# Patient Record
Sex: Male | Born: 1950 | Race: White | Hispanic: No | Marital: Married | Smoking: Former smoker
Health system: Southern US, Community
[De-identification: ages and names within clinical notes are randomized; demographics above are authoritative.]

## PROBLEM LIST (undated history)

## (undated) DIAGNOSIS — I639 Cerebral infarction, unspecified: Secondary | ICD-10-CM

## (undated) DIAGNOSIS — E119 Type 2 diabetes mellitus without complications: Secondary | ICD-10-CM

## (undated) DIAGNOSIS — E785 Hyperlipidemia, unspecified: Secondary | ICD-10-CM

## (undated) DIAGNOSIS — K219 Gastro-esophageal reflux disease without esophagitis: Secondary | ICD-10-CM

## (undated) DIAGNOSIS — M869 Osteomyelitis, unspecified: Secondary | ICD-10-CM

## (undated) DIAGNOSIS — Z9289 Personal history of other medical treatment: Secondary | ICD-10-CM

## (undated) DIAGNOSIS — Z9989 Dependence on other enabling machines and devices: Secondary | ICD-10-CM

## (undated) DIAGNOSIS — G8929 Other chronic pain: Secondary | ICD-10-CM

## (undated) DIAGNOSIS — J449 Chronic obstructive pulmonary disease, unspecified: Secondary | ICD-10-CM

## (undated) DIAGNOSIS — G4733 Obstructive sleep apnea (adult) (pediatric): Secondary | ICD-10-CM

## (undated) DIAGNOSIS — M549 Dorsalgia, unspecified: Secondary | ICD-10-CM

## (undated) DIAGNOSIS — F419 Anxiety disorder, unspecified: Secondary | ICD-10-CM

## (undated) DIAGNOSIS — J45909 Unspecified asthma, uncomplicated: Secondary | ICD-10-CM

## (undated) DIAGNOSIS — I10 Essential (primary) hypertension: Secondary | ICD-10-CM

## (undated) DIAGNOSIS — I219 Acute myocardial infarction, unspecified: Secondary | ICD-10-CM

## (undated) DIAGNOSIS — I509 Heart failure, unspecified: Secondary | ICD-10-CM

## (undated) DIAGNOSIS — J189 Pneumonia, unspecified organism: Secondary | ICD-10-CM

## (undated) DIAGNOSIS — I2510 Atherosclerotic heart disease of native coronary artery without angina pectoris: Secondary | ICD-10-CM

## (undated) HISTORY — DX: Hyperlipidemia, unspecified: E78.5

## (undated) HISTORY — DX: Chronic obstructive pulmonary disease, unspecified: J44.9

## (undated) HISTORY — PX: CORONARY ANGIOPLASTY WITH STENT PLACEMENT: SHX49

## (undated) HISTORY — DX: Atherosclerotic heart disease of native coronary artery without angina pectoris: I25.10

## (undated) HISTORY — PX: CARDIAC CATHETERIZATION: SHX172

## (undated) HISTORY — DX: Essential (primary) hypertension: I10

---
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
## 2004-03-31 ENCOUNTER — Inpatient Hospital Stay (HOSPITAL_COMMUNITY): Admission: EM | Admit: 2004-03-31 | Discharge: 2004-04-03 | Payer: Self-pay | Admitting: *Deleted

## 2005-07-08 ENCOUNTER — Encounter: Payer: Self-pay | Admitting: Emergency Medicine

## 2005-07-08 ENCOUNTER — Inpatient Hospital Stay (HOSPITAL_COMMUNITY): Admission: AD | Admit: 2005-07-08 | Discharge: 2005-07-10 | Payer: Self-pay | Admitting: Cardiology

## 2005-07-08 ENCOUNTER — Ambulatory Visit: Payer: Self-pay | Admitting: Cardiology

## 2005-08-03 ENCOUNTER — Ambulatory Visit: Payer: Self-pay | Admitting: Cardiology

## 2005-11-10 ENCOUNTER — Ambulatory Visit: Payer: Self-pay | Admitting: Cardiology

## 2005-11-12 ENCOUNTER — Ambulatory Visit: Payer: Self-pay

## 2005-11-12 ENCOUNTER — Ambulatory Visit: Payer: Self-pay | Admitting: Cardiology

## 2005-11-13 ENCOUNTER — Inpatient Hospital Stay (HOSPITAL_COMMUNITY): Admission: EM | Admit: 2005-11-13 | Discharge: 2005-11-15 | Payer: Self-pay | Admitting: Emergency Medicine

## 2005-11-15 ENCOUNTER — Ambulatory Visit: Payer: Self-pay | Admitting: Cardiology

## 2005-11-16 ENCOUNTER — Ambulatory Visit: Payer: Self-pay | Admitting: *Deleted

## 2005-11-30 ENCOUNTER — Ambulatory Visit: Payer: Self-pay

## 2005-12-22 ENCOUNTER — Ambulatory Visit: Payer: Self-pay | Admitting: Cardiology

## 2006-07-22 ENCOUNTER — Ambulatory Visit: Payer: Self-pay | Admitting: Internal Medicine

## 2006-07-28 ENCOUNTER — Ambulatory Visit: Payer: Self-pay | Admitting: Cardiovascular Disease

## 2006-07-28 ENCOUNTER — Inpatient Hospital Stay (HOSPITAL_BASED_OUTPATIENT_CLINIC_OR_DEPARTMENT_OTHER): Admission: RE | Admit: 2006-07-28 | Discharge: 2006-07-28 | Payer: Self-pay | Admitting: Cardiology

## 2006-08-08 ENCOUNTER — Ambulatory Visit: Payer: Self-pay | Admitting: Internal Medicine

## 2006-08-11 ENCOUNTER — Ambulatory Visit: Payer: Self-pay | Admitting: Cardiology

## 2006-08-30 ENCOUNTER — Ambulatory Visit: Payer: Self-pay | Admitting: Pulmonary Disease

## 2006-10-18 ENCOUNTER — Ambulatory Visit: Payer: Self-pay | Admitting: Cardiology

## 2007-02-12 ENCOUNTER — Ambulatory Visit: Payer: Self-pay | Admitting: Internal Medicine

## 2007-02-12 ENCOUNTER — Inpatient Hospital Stay (HOSPITAL_COMMUNITY): Admission: EM | Admit: 2007-02-12 | Discharge: 2007-02-16 | Payer: Self-pay | Admitting: Emergency Medicine

## 2007-02-17 ENCOUNTER — Ambulatory Visit: Payer: Self-pay | Admitting: Internal Medicine

## 2007-02-28 ENCOUNTER — Ambulatory Visit: Payer: Self-pay | Admitting: Cardiology

## 2007-06-14 ENCOUNTER — Ambulatory Visit: Payer: Self-pay | Admitting: Cardiology

## 2007-10-03 ENCOUNTER — Ambulatory Visit: Payer: Self-pay | Admitting: Cardiology

## 2007-12-04 ENCOUNTER — Inpatient Hospital Stay (HOSPITAL_COMMUNITY): Admission: EM | Admit: 2007-12-04 | Discharge: 2007-12-06 | Payer: Self-pay | Admitting: Emergency Medicine

## 2007-12-04 ENCOUNTER — Ambulatory Visit: Payer: Self-pay | Admitting: Cardiology

## 2008-01-31 ENCOUNTER — Ambulatory Visit: Payer: Self-pay | Admitting: Cardiology

## 2008-06-19 ENCOUNTER — Inpatient Hospital Stay (HOSPITAL_COMMUNITY): Admission: EM | Admit: 2008-06-19 | Discharge: 2008-06-20 | Payer: Self-pay | Admitting: Emergency Medicine

## 2008-06-19 ENCOUNTER — Ambulatory Visit: Payer: Self-pay | Admitting: Cardiology

## 2008-07-30 ENCOUNTER — Ambulatory Visit: Payer: Self-pay | Admitting: Cardiology

## 2008-11-05 ENCOUNTER — Inpatient Hospital Stay (HOSPITAL_COMMUNITY): Admission: EM | Admit: 2008-11-05 | Discharge: 2008-11-09 | Payer: Self-pay | Admitting: Emergency Medicine

## 2008-11-05 ENCOUNTER — Ambulatory Visit: Payer: Self-pay | Admitting: Pulmonary Disease

## 2008-11-05 ENCOUNTER — Ambulatory Visit: Payer: Self-pay | Admitting: Cardiovascular Disease

## 2009-02-06 ENCOUNTER — Ambulatory Visit: Payer: Self-pay | Admitting: Cardiology

## 2009-02-13 ENCOUNTER — Encounter: Payer: Self-pay | Admitting: Cardiology

## 2009-03-12 ENCOUNTER — Inpatient Hospital Stay (HOSPITAL_COMMUNITY): Admission: EM | Admit: 2009-03-12 | Discharge: 2009-03-14 | Payer: Self-pay | Admitting: Emergency Medicine

## 2009-05-26 ENCOUNTER — Telehealth: Payer: Self-pay | Admitting: Cardiology

## 2009-08-07 ENCOUNTER — Ambulatory Visit: Payer: Self-pay | Admitting: Cardiology

## 2009-08-07 DIAGNOSIS — I10 Essential (primary) hypertension: Secondary | ICD-10-CM

## 2009-08-07 DIAGNOSIS — J449 Chronic obstructive pulmonary disease, unspecified: Secondary | ICD-10-CM

## 2009-08-07 DIAGNOSIS — J4489 Other specified chronic obstructive pulmonary disease: Secondary | ICD-10-CM | POA: Insufficient documentation

## 2009-08-07 DIAGNOSIS — E785 Hyperlipidemia, unspecified: Secondary | ICD-10-CM

## 2009-08-07 DIAGNOSIS — R209 Unspecified disturbances of skin sensation: Secondary | ICD-10-CM | POA: Insufficient documentation

## 2009-08-07 DIAGNOSIS — I2510 Atherosclerotic heart disease of native coronary artery without angina pectoris: Secondary | ICD-10-CM

## 2009-08-08 ENCOUNTER — Encounter: Payer: Self-pay | Admitting: Cardiology

## 2009-08-24 ENCOUNTER — Emergency Department (HOSPITAL_COMMUNITY): Admission: EM | Admit: 2009-08-24 | Discharge: 2009-08-24 | Payer: Self-pay | Admitting: Emergency Medicine

## 2009-10-29 ENCOUNTER — Telehealth (INDEPENDENT_AMBULATORY_CARE_PROVIDER_SITE_OTHER): Payer: Self-pay | Admitting: *Deleted

## 2009-10-29 ENCOUNTER — Encounter (INDEPENDENT_AMBULATORY_CARE_PROVIDER_SITE_OTHER): Payer: Self-pay | Admitting: *Deleted

## 2009-11-05 ENCOUNTER — Encounter (INDEPENDENT_AMBULATORY_CARE_PROVIDER_SITE_OTHER): Payer: Self-pay | Admitting: *Deleted

## 2009-11-24 ENCOUNTER — Telehealth: Payer: Self-pay | Admitting: Cardiology

## 2009-11-28 ENCOUNTER — Telehealth (INDEPENDENT_AMBULATORY_CARE_PROVIDER_SITE_OTHER): Payer: Self-pay | Admitting: *Deleted

## 2010-01-21 ENCOUNTER — Encounter: Payer: Self-pay | Admitting: Cardiology

## 2010-01-21 ENCOUNTER — Ambulatory Visit: Payer: Self-pay | Admitting: Cardiology

## 2010-01-30 ENCOUNTER — Ambulatory Visit: Payer: Self-pay | Admitting: Cardiovascular Disease

## 2010-01-30 ENCOUNTER — Ambulatory Visit: Payer: Self-pay | Admitting: Internal Medicine

## 2010-01-30 ENCOUNTER — Inpatient Hospital Stay (HOSPITAL_COMMUNITY): Admission: EM | Admit: 2010-01-30 | Discharge: 2010-02-04 | Payer: Self-pay | Admitting: Emergency Medicine

## 2010-02-04 ENCOUNTER — Encounter: Payer: Self-pay | Admitting: Internal Medicine

## 2010-02-11 ENCOUNTER — Encounter: Payer: Self-pay | Admitting: Internal Medicine

## 2010-02-11 ENCOUNTER — Ambulatory Visit: Payer: Self-pay | Admitting: Internal Medicine

## 2010-02-11 DIAGNOSIS — F17200 Nicotine dependence, unspecified, uncomplicated: Secondary | ICD-10-CM

## 2010-02-12 ENCOUNTER — Encounter (INDEPENDENT_AMBULATORY_CARE_PROVIDER_SITE_OTHER): Payer: Self-pay | Admitting: *Deleted

## 2010-02-17 ENCOUNTER — Telehealth: Payer: Self-pay | Admitting: Internal Medicine

## 2010-02-24 ENCOUNTER — Ambulatory Visit: Payer: Self-pay | Admitting: Internal Medicine

## 2010-03-04 ENCOUNTER — Telehealth: Payer: Self-pay | Admitting: Cardiology

## 2010-03-08 ENCOUNTER — Encounter: Payer: Self-pay | Admitting: Cardiology

## 2010-03-17 ENCOUNTER — Encounter (INDEPENDENT_AMBULATORY_CARE_PROVIDER_SITE_OTHER): Payer: Self-pay | Admitting: *Deleted

## 2010-03-25 ENCOUNTER — Telehealth (INDEPENDENT_AMBULATORY_CARE_PROVIDER_SITE_OTHER): Payer: Self-pay | Admitting: *Deleted

## 2010-03-26 ENCOUNTER — Encounter: Payer: Self-pay | Admitting: Internal Medicine

## 2010-06-15 ENCOUNTER — Telehealth: Payer: Self-pay | Admitting: Cardiology

## 2010-07-24 ENCOUNTER — Ambulatory Visit: Payer: Self-pay | Admitting: Internal Medicine

## 2010-07-25 ENCOUNTER — Inpatient Hospital Stay (HOSPITAL_COMMUNITY): Admission: EM | Admit: 2010-07-25 | Discharge: 2010-07-28 | Payer: Self-pay | Admitting: Emergency Medicine

## 2010-08-08 ENCOUNTER — Emergency Department (HOSPITAL_COMMUNITY): Admission: EM | Admit: 2010-08-08 | Discharge: 2010-08-08 | Payer: Self-pay | Admitting: Emergency Medicine

## 2010-08-09 ENCOUNTER — Inpatient Hospital Stay (HOSPITAL_COMMUNITY): Admission: EM | Admit: 2010-08-09 | Discharge: 2010-08-15 | Payer: Self-pay | Admitting: Emergency Medicine

## 2010-08-15 ENCOUNTER — Encounter: Payer: Self-pay | Admitting: Cardiology

## 2010-10-06 ENCOUNTER — Telehealth: Payer: Self-pay | Admitting: Cardiology

## 2010-10-15 ENCOUNTER — Telehealth: Payer: Self-pay | Admitting: Internal Medicine

## 2010-11-24 ENCOUNTER — Ambulatory Visit: Payer: Self-pay | Admitting: Family Medicine

## 2010-11-24 LAB — CONVERTED CEMR LAB: Microalb, Ur: 0.5 mg/dL (ref 0.00–1.89)

## 2011-01-11 ENCOUNTER — Telehealth: Payer: Self-pay | Admitting: Cardiology

## 2011-01-19 NOTE — Progress Notes (Signed)
Summary: need plavix order from [facility]  Phone Note Call from Patient Call back at [facility] Phone [phone]   Refills Requested: Medication #1:  PLAVIX 75 MG TABS daily Caller: Patient Reason for Call: Talk to Nurse Summary of Call: Pt need plavix 75 mg from [facility].  Initial call taken by: Lorne Skeens,  June 15, 2010 9:09 AM  Follow-up for Phone Call        spoke with pt. His Plavix assistance program has expired.  He will need to fill out paper work and resubmit.  Pt aware.  Will put out front and place samples with form. Dennis Bast, RN, BSN  June 15, 2010 10:28 AM

## 2011-01-19 NOTE — Progress Notes (Signed)
Summary: reorder plavix  Phone Note Call from Patient   Caller: Patient [phone] Reason for Call: Talk to Nurse Summary of Call: pt needing a reorder of plavix Initial call taken by: Glynda Jaeger,  October 06, 2010 9:18 AM  Follow-up for Phone Call        PLAVIX RE ORDERED FOR PT. Follow-up by: Charolotte Capuchin, RN,  October 06, 2010 1:00 PM     Appended Document: reorder plavix Plavix is in and pt is aware.  Samples will be placed out front  Lot [id number] and August  2014

## 2011-01-19 NOTE — Progress Notes (Signed)
Summary: refill/ hla  Phone Note Refill Request Message from:  Patient on February 17, 2010 3:11 PM  [facility] would like for pt to have another round of prednisone and doxycycline, she says R lung continues w/ rhonchi and has fevers from time to time but does not have the exact temp since it was not checked  Initial call taken by: Marin Roberts RN,  February 17, 2010 3:14 PM  Follow-up for Phone Call        Rx denied because he needs to be seen in the clinic for re-evaluation. please make appointment soon. Follow-up by: Jackson Latino MD,  February 19, 2010 1:37 PM

## 2011-01-19 NOTE — Assessment & Plan Note (Signed)
Summary: see note from HHN, fevers at night, sob/pcp-yang/hla   Vital Signs:  Patient profile:   60 year old male Height:      71 inches (180.34 cm) Weight:      189.03 pounds (85.92 kg) BMI:     26.46 O2 Sat:      97 % on Room air Temp:     97.3 degrees F (36.28 degrees C) oral Pulse rate:   70 / minute BP sitting:   126 / 83  (right arm)  Vitals Entered By: Angelina Ok RN (February 24, 2010 10:45 AM)  O2 Flow:  Room air Is Patient Diabetic? No Pain Assessment Patient in pain? no      Nutritional Status BMI of 25 - 29 = overweight  Have you ever been in a relationship where you felt threatened, hurt or afraid?No   Does patient need assistance? Functional Status Self care Ambulation Normal Comments REcheck up. Fevers at night occassionally.  Chest tightness took a breathing treatment  and ASA felt better.   Primary Care Provider:  Jackson Latino MD   History of Present Illness: 60 year old man with pmh as described in EMR is here today for a follow up appointment for his COPD. He says that he had an episode of SOB last week when he bacame so short of breath that he had to get up and stand and take his breathing treatments. Also had a couple of days when he was having chills and fever. No other complaints at this time.  Depression History:      The patient denies a depressed mood most of the day and a diminished interest in his usual daily activities.         Preventive Screening-Counseling & Management  Alcohol-Tobacco     Smoking Status: quit     Smoking Cessation Counseling: yes     Smoke Cessation Stage: quit     Packs/Day: <0.25     Year Quit: 2011  Comments: Stopped after last hospital visit.  Problems Prior to Update: 1)  Tobacco User  (ICD-305.1) 2)  Paresthesia  (ICD-782.0) 3)  Hyperlipidemia  (ICD-272.4) 4)  Hypertension  (ICD-401.9) 5)  Cad  (ICD-414.00) 6)  COPD  (ICD-496)  Medications Prior to Update: 1)  Tracer Study .... Daily 2)   Nitroglycerin 0.4 Mg Subl (Nitroglycerin) .... Place 1 Tablet Under Tongue As Directed 3)  Plavix 75 Mg Tabs (Clopidogrel Bisulfate) .... Daily 4)  Albuterol Sulfate (2.5 Mg/42ml) 0.083% Nebu (Albuterol Sulfate) .... Use One Vial in [employer] Three Times A Day 5)  Ipratropium Bromide 0.02 % Soln (Ipratropium Bromide) .... Inhale 1 Vial Via Nebulizer Three Times A Day 6)  Pravastatin Sodium 40 Mg Tabs (Pravastatin Sodium) .... One By Mouth At Bedtime 7)  Aspirin 325 Mg  Tabs (Aspirin) .... Daily 8)  Metoprolol Tartrate 50 Mg Tabs (Metoprolol Tartrate) .... Take 1 Tablet By Mouth Two Times A Day 9)  Colace 100 Mg Caps (Docusate Sodium) .... Take 1 Tablet By Mouth Two Times A Day  Current Medications (verified): 1)  Tracer Study .... Daily 2)  Nitroglycerin 0.4 Mg Subl (Nitroglycerin) .... Place 1 Tablet Under Tongue As Directed 3)  Plavix 75 Mg Tabs (Clopidogrel Bisulfate) .... Daily 4)  Albuterol Sulfate (2.5 Mg/58ml) 0.083% Nebu (Albuterol Sulfate) .... Use One Vial in [employer] Three Times A Day 5)  Ipratropium Bromide 0.02 % Soln (Ipratropium Bromide) .... Inhale 1 Vial Via Nebulizer Three Times A Day 6)  Pravastatin Sodium  40 Mg Tabs (Pravastatin Sodium) .... One By Mouth At Bedtime 7)  Aspirin 325 Mg  Tabs (Aspirin) .... Daily 8)  Metoprolol Tartrate 50 Mg Tabs (Metoprolol Tartrate) .... Take 1 Tablet By Mouth Two Times A Day 9)  Colace 100 Mg Caps (Docusate Sodium) .... Take 1 Tablet By Mouth Two Times A Day 10)  Furosemide 20 Mg Tabs (Furosemide) .... Take 1 Tablet By Mouth Once A Day 11)  Flovent Hfa 110 Mcg/act Aero (Fluticasone Propionate  Hfa) .... Take 2 Puffs Two Times A Day  Allergies (verified): 1)  ! * Liptor  Past History:  Past Medical History: Last updated: 08/07/2009  1. Severe COPD.   2. Coronary artery disease status post multiple stents, most recent       catheterization in July 2009.   3. Hypertension.   4. Hyperlipidemia.      Past Surgical History: Last  updated: 08/07/2009 none  Family History: Last updated: 08/07/2009  He has a very heavy tobacco history, used to smoke as   much as 3 packs per day, but now says he only smokes 1-1/2 packs in the   past year.  Denies alcohol or illicit drug use.  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  Lives with   his wife in [place].   Social History: Last updated: 08/07/2009 Significant for hypertension and coronary artery   disease.      Risk Factors: Smoking Status: quit (02/24/2010) Packs/Day: <0.25 (02/24/2010)  Social History: Smoking Status:  quit Packs/Day:  <0.25  Review of Systems      See HPI  Physical Exam  Additional Exam:  Gen: AOx3, in no acute distress Eyes: PERRL, EOMI ENT:MMM, No erythema noted in posterior pharynx Neck: No JVD, No LAP Chest: b/l wheezing and increased expiratory sounds CVS: regular rhythmic rate, NO M/R/G, S1 S2 normal Abdo: soft,ND, BS+x4, Non tender and No hepatosplenomegaly EXT: No odema noted Neuro: Non focal, gait is normal Skin: no rashes noted.    Impression & Recommendations:  Problem # 1:  COPD (ICD-496) Assessment Deteriorated Patient is having shortness of breath on the current doses of nebulizers. His physical exam is positive for mild wheezing but his saturation is 97% on room air. Dr Aundria Rud feels that we should add a inhaled steroid to his regimen. We also discussed that he is probably emphysema as his sats are normal on room air but he is dyspnoea(pink puffers).  We also discussed that since he is having symptoms of PND, low dose of lasix might benefit him. His updated medication list for this problem includes:    Albuterol Sulfate (2.5 Mg/24ml) 0.083% Nebu (Albuterol sulfate) ..... Use one vial in nedbulizer three times a day    Ipratropium Bromide 0.02 % Soln (Ipratropium bromide) ..... Inhale 1 vial via nebulizer three times a day    Flovent Hfa 110 Mcg/act Aero (Fluticasone  propionate  hfa) .Marland Kitchen... Take 2 puffs two times a day  Pulmonary Functions Reviewed: O2 sat: 97 (02/24/2010)     Problem # 2:  TOBACCO USER (ICD-305.1) Assessment: Improved He quit smoking since he has been discharged from the hospital. We congratulated him for that and gave him all the information about how to obatin help if he wants. Encouraged smoking cessation and discussed different methods for smoking cessation.   Problem # 3:  HYPERTENSION (ICD-401.9) Assessment: Improved Well controlled on current meds. His updated medication list for this problem includes:  Metoprolol Tartrate 50 Mg Tabs (Metoprolol tartrate) .Marland Kitchen... Take 1 tablet by mouth two times a day    Furosemide 20 Mg Tabs (Furosemide) .Marland Kitchen... Take 1 tablet by mouth once a day  BP today: 126/83 Prior BP: 105/74 (02/11/2010)  Problem # 4:  Preventive Health Care (ICD-V70.0) Assessment: Comment Only  Reviewed preventive care protocols, scheduled due services, and updated immunizations.  Complete Medication List: 1)  Tracer Study  .... Daily 2)  Nitroglycerin 0.4 Mg Subl (Nitroglycerin) .... Place 1 tablet under tongue as directed 3)  Plavix 75 Mg Tabs (Clopidogrel bisulfate) .... Daily 4)  Albuterol Sulfate (2.5 Mg/10ml) 0.083% Nebu (Albuterol sulfate) .... Use one vial in nedbulizer three times a day 5)  Ipratropium Bromide 0.02 % Soln (Ipratropium bromide) .... Inhale 1 vial via nebulizer three times a day 6)  Pravastatin Sodium 40 Mg Tabs (Pravastatin sodium) .... One by mouth at bedtime 7)  Aspirin 325 Mg Tabs (Aspirin) .... Daily 8)  Metoprolol Tartrate 50 Mg Tabs (Metoprolol tartrate) .... Take 1 tablet by mouth two times a day 9)  Colace 100 Mg Caps (Docusate sodium) .... Take 1 tablet by mouth two times a day 10)  Furosemide 20 Mg Tabs (Furosemide) .... Take 1 tablet by mouth once a day 11)  Flovent Hfa 110 Mcg/act Aero (Fluticasone propionate  hfa) .... Take 2 puffs two times a day  Patient Instructions: 1)   Please schedule a follow-up appointment in 2 weeks. 2)  It is important that you exercise regularly at least 20 minutes 5 times a week. If you develop chest pain, have severe difficulty breathing, or feel very tired , stop exercising immediately and seek medical attention. 3)  Check your Blood Pressure regularly. If it is above: 140/90 you should make an appointment. Prescriptions: FLOVENT HFA 110 MCG/ACT AERO (FLUTICASONE PROPIONATE  HFA) take 2 puffs two times a day  #1 x 1   Entered and Authorized by:   Lars Mage MD   Signed by:   Lars Mage MD on 02/24/2010   Method used:   Electronically to        [facility] [address] (retail)       [phone]       [place], [place]  [address]       Ph: [phone]       Fax: [phone]   RxID:   [id number] FUROSEMIDE 20 MG TABS (FUROSEMIDE) Take 1 tablet by mouth once a day  #30 x 1   Entered and Authorized by:   Lars Mage MD   Signed by:   Lars Mage MD on 02/24/2010   Method used:   Electronically to        [facility] [address] (retail)       [phone]       [place], [place]  [address]       Ph: [phone]       Fax: [phone]   RxID:   [id number]   Prevention & Chronic Care Immunizations   Influenza vaccine: Not documented    Tetanus booster: Not documented    Pneumococcal vaccine: Not documented    H. zoster vaccine: Not documented  Colorectal Screening   Hemoccult: Not documented   Hemoccult action/deferral: Deferred  (02/24/2010)    Colonoscopy: Not documented   Colonoscopy action/deferral: Deferred  (02/24/2010)  Other Screening   PSA: Not documented  Smoking status: quit  (02/24/2010)  Lipids   Total Cholesterol: Not documented   LDL: Not documented   LDL Direct: Not documented   HDL: Not documented   Triglycerides: Not documented    SGOT (AST): Not documented   SGPT (ALT): Not documented   Alkaline phosphatase: Not  documented   Total bilirubin: Not documented    Lipid flowsheet reviewed?: Yes   Progress toward LDL goal: At goal  Hypertension   Last Blood Pressure: 126 / 83  (02/24/2010)   Serum creatinine: Not documented   Serum potassium Not documented    Hypertension flowsheet reviewed?: Yes   Progress toward BP goal: Improved  Self-Management Support :    Patient will work on the following items until the next clinic visit to reach self-care goals:     Medications and monitoring: take my medicines every day, bring all of my medications to every visit  (02/24/2010)     Eating: drink diet soda or water instead of juice or soda, eat more vegetables, eat foods that are low in salt  (02/24/2010)     Activity: take a 30 minute walk every day  (02/24/2010)    Hypertension self-management support: Education handout  (02/24/2010)   Hypertension education handout printed    Lipid self-management support: Education handout, Resources for patients handout  (02/11/2010)      Vital Signs:  Patient profile:   60 year old male Height:      71 inches (180.34 cm) Weight:      189.03 pounds (85.92 kg) BMI:     26.46 O2 Sat:      97 % Temp:     97.3 degrees F (36.28 degrees C) oral Pulse rate:   70 / minute BP sitting:   126 / 83  (right arm)  Vitals Entered By: Angelina Ok RN (February 24, 2010 10:45 AM)  O2 Flow:  Room air

## 2011-01-19 NOTE — Progress Notes (Signed)
Summary: REFILL PLAVIX/ called for from [employer]/pt call again  Phone Note Refill Request Message from:  Patient on March 04, 2010 10:14 AM  Refills Requested: Medication #1:  PLAVIX 75 MG TABS daily PT NEED PLAVIX ORDERED  SAID  OFFICE ORDERS THIS MEDICATION (BRISTOL MYERS SQUIBB)  Initial call taken by: Judie Grieve,  March 04, 2010 10:15 AM  Follow-up for Phone Call        pt calling again, request call back, Migdalia Dk  March 11, 2010 10:17 AM   pt call to see if meds are in yet. please call [phone] Omer Jack  March 18, 2010 10:09 AM  NOT HERE YET  PAM FLEMING-HAYES,RN Plavix is in.  Pt aware. Follow-up by: Charolotte Capuchin, RN,  March 20, 2010 8:43 AM

## 2011-01-19 NOTE — Medication Information (Signed)
Summary: [facility]: RX  [facility]: RX   Imported By: Florinda Marker 02/12/2010 14:53:25  _____________________________________________________________________  External Attachment:    Type:   Image     Comment:   External Document

## 2011-01-19 NOTE — Letter (Signed)
Summary: Appointment - Missed  [facility]     [place], [place]    Phone:   Fax:      February 12, 2010 MRN: [id number]   [name] [address] [place], [place]  [address]   Dear [name],  Our records indicate you missed your appointment on  02-09-2010    with  Dr. Antoine Poche It is very important that we reach you to reschedule this appointment. We look forward to participating in your health care needs. Please contact us at the number listed above at your earliest convenience to reschedule this appointment.     Sincerely,      Lorne Skeens  [facility] Scheduling Team

## 2011-01-19 NOTE — Assessment & Plan Note (Signed)
Summary: NEW TO CLINIC/SB.   Vital Signs:  Patient profile:   60 year old male Height:      71 inches Weight:      189.1 pounds BMI:     26.47 O2 Sat:      95 % on Room air Temp:     97.0 degrees F oral Pulse rate:   80 / minute BP sitting:   105 / 74  (right arm)  Vitals Entered By: Filomena Jungling NT II (February 11, 2010 1:50 PM)  O2 Flow:  Room air CC: HFU Is Patient Diabetic? No Pain Assessment Patient in pain? yes     Location: hip, headaches Intensity: 4 Type: aching Onset of pain  hippain chronic -    headaches recently Nutritional Status BMI of 25 - 29 = overweight  Have you ever been in a relationship where you felt threatened, hurt or afraid?No   Does patient need assistance? Functional Status Self care Ambulation Normal   Primary Care Provider:  Jackson Latino MD  CC:  HFU.  History of Present Illness: Patient is a 60 year old male with PMH of severe COPD, HTN and CAD status post multiple stents, who came here for recent hospital discharge. He was discharged to home in 02/04/2010 for COPD exacerbation and after discharge he has been doing well, his SOB has significantlyu improved, no other c/o including CP, fever or chill. He has been taking all his meds, but don't have nebulizer and has to borrow from other people. Current smoker and 2-3 cigarettes a day, denies drug abuse.  Preventive Screening-Counseling & Management  Alcohol-Tobacco     Smoking Status: current     Smoking Cessation Counseling: yes     Packs/Day: 1-2 week  Problems Prior to Update: 1)  Paresthesia  (ICD-782.0) 2)  Hyperlipidemia  (ICD-272.4) 3)  Hypertension  (ICD-401.9) 4)  Cad  (ICD-414.00) 5)  COPD  (ICD-496)  Medications Prior to Update: 1)  Tracer Study .... Daily 2)  Nitroglycerin 0.4 Mg Subl (Nitroglycerin) .... Place 1 Tablet Under Tongue As Directed 3)  Plavix 75 Mg Tabs (Clopidogrel Bisulfate) .... Daily 4)  Albuterol Sulfate (2.5 Mg/5ml) 0.083% Nebu (Albuterol Sulfate)  .... Use One Vial in [facility] Three Times A Day 5)  Ipratropium Bromide 0.02 % Soln (Ipratropium Bromide) .... Inhale 1 Vial Via Nebulizer Three Times A Day 6)  Pravastatin Sodium 40 Mg Tabs (Pravastatin Sodium) .... One By Mouth At Bedtime 7)  Aspirin 325 Mg  Tabs (Aspirin) .... Daily 8)  Metoprolol Tartrate 50 Mg Tabs (Metoprolol Tartrate) .... Take 1 Tablet By Mouth Two Times A Day 9)  Colace 100 Mg Caps (Docusate Sodium) .... Take 1 Tablet By Mouth Two Times A Day  Current Medications (verified): 1)  Tracer Study .... Daily 2)  Nitroglycerin 0.4 Mg Subl (Nitroglycerin) .... Place 1 Tablet Under Tongue As Directed 3)  Plavix 75 Mg Tabs (Clopidogrel Bisulfate) .... Daily 4)  Albuterol Sulfate (2.5 Mg/27ml) 0.083% Nebu (Albuterol Sulfate) .... Use One Vial in [facility] Three Times A Day 5)  Ipratropium Bromide 0.02 % Soln (Ipratropium Bromide) .... Inhale 1 Vial Via Nebulizer Three Times A Day 6)  Pravastatin Sodium 40 Mg Tabs (Pravastatin Sodium) .... One By Mouth At Bedtime 7)  Aspirin 325 Mg  Tabs (Aspirin) .... Daily 8)  Metoprolol Tartrate 50 Mg Tabs (Metoprolol Tartrate) .... Take 1 Tablet By Mouth Two Times A Day 9)  Colace 100 Mg Caps (Docusate Sodium) .... Take 1 Tablet By Mouth Two Times A  Day  Allergies (verified): 1)  ! * Liptor  Past History:  Past Medical History: Last updated: 08/07/2009  1. Severe COPD.   2. Coronary artery disease status post multiple stents, most recent       catheterization in July 2009.   3. Hypertension.   4. Hyperlipidemia.      Family History: Last updated: 08/07/2009  He has a very heavy tobacco history, used to smoke as   much as 3 packs per day, but now says he only smokes 1-1/2 packs in the   past year.  Denies alcohol or illicit drug use.  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  Lives with   his wife in [place].   Social History: Last updated: 08/07/2009 Significant for  hypertension and coronary artery   disease.      Risk Factors: Smoking Status: current (02/11/2010) Packs/Day: 1-2 week (02/11/2010)  Family History: Reviewed history from 08/07/2009 and no changes required.  He has a very heavy tobacco history, used to smoke as   much as 3 packs per day, but now says he only smokes 1-1/2 packs in the   past year.  Denies alcohol or illicit drug use.  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  Lives with   his wife in [place].   Social History: Reviewed history from 08/07/2009 and no changes required. Significant for hypertension and coronary artery   disease.     Smoking Status:  current Packs/Day:  1-2 week  Review of Systems  The patient denies anorexia, fever, decreased hearing, chest pain, dyspnea on exertion, peripheral edema, prolonged cough, headaches, hemoptysis, abdominal pain, melena, and hematochezia.    Physical Exam  General:  alert, well-developed, well-nourished, and well-hydrated.   Head:  normocephalic.   Eyes:  pupils reactive to light.   Ears:  no external deformities.   Nose:  no nasal discharge.   Mouth:  pharynx pink and moist.   Neck:  supple.   Lungs:  normal respiratory effort, no intercostal retractions, no accessory muscle use, normal breath sounds, and no crackles.  Mild wheezing. Heart:  normal rate, regular rhythm, no murmur, no gallop, and no JVD.   Abdomen:  soft, non-tender, normal bowel sounds, no distention, and no masses.   Msk:  normal ROM, no joint tenderness, no joint swelling, no joint warmth, and no redness over joints.   Pulses:  2+ Extremities:  No edema. Neurologic:  alert & oriented X3, cranial nerves II-XII intact, strength normal in all extremities, sensation intact to light touch, and DTRs symmetrical and normal.     Impression & Recommendations:  Problem # 1:  COPD (ICD-496) Assessment Improved His SOB has significantly improved and will  continue current medications and advised him to quit smoking. Wheezing has been much better. Has written prescription for nebulizer and will fax it to [facility]. May add steroid inhaler if frequent flare of COPD. His updated medication list for this problem includes:    Albuterol Sulfate (2.5 Mg/1ml) 0.083% Nebu (Albuterol sulfate) ..... Use one vial in nedbulizer three times a day    Ipratropium Bromide 0.02 % Soln (Ipratropium bromide) ..... Inhale 1 vial via nebulizer three times a day  Pulmonary Functions Reviewed: O2 sat: 95 (02/11/2010)     Problem # 2:  HYPERTENSION (ICD-401.9) Assessment: Improved His BP at goal and will continue BB as he also has  CAD.  His updated medication list for this problem includes:    Metoprolol Tartrate 50 Mg Tabs (Metoprolol tartrate) .Marland Kitchen... Take 1 tablet by mouth two times a day  BP today: 105/74 Prior BP: 115/74 (08/07/2009)  Problem # 3:  CAD (ICD-414.00) Assessment: Unchanged Now no CP and myoview negative.  He has CAD with multiple stents and will continue current meds and advised him to quit smoking and exercise.  His updated medication list for this problem includes:    Nitroglycerin 0.4 Mg Subl (Nitroglycerin) .Marland Kitchen... Place 1 tablet under tongue as directed    Plavix 75 Mg Tabs (Clopidogrel bisulfate) .Marland Kitchen... Daily    Aspirin 325 Mg Tabs (Aspirin) .Marland Kitchen... Daily    Metoprolol Tartrate 50 Mg Tabs (Metoprolol tartrate) .Marland Kitchen... Take 1 tablet by mouth two times a day  Problem # 4:  TOBACCO USER (ICD-305.1) Assessment: Comment Only  Encouraged smoking cessation and discussed different methods for smoking cessation.   Complete Medication List: 1)  Tracer Study  .... Daily 2)  Nitroglycerin 0.4 Mg Subl (Nitroglycerin) .... Place 1 tablet under tongue as directed 3)  Plavix 75 Mg Tabs (Clopidogrel bisulfate) .... Daily 4)  Albuterol Sulfate (2.5 Mg/78ml) 0.083% Nebu (Albuterol sulfate) .... Use one vial in nedbulizer three times a day 5)   Ipratropium Bromide 0.02 % Soln (Ipratropium bromide) .... Inhale 1 vial via nebulizer three times a day 6)  Pravastatin Sodium 40 Mg Tabs (Pravastatin sodium) .... One by mouth at bedtime 7)  Aspirin 325 Mg Tabs (Aspirin) .... Daily 8)  Metoprolol Tartrate 50 Mg Tabs (Metoprolol tartrate) .... Take 1 tablet by mouth two times a day 9)  Colace 100 Mg Caps (Docusate sodium) .... Take 1 tablet by mouth two times a day  Patient Instructions: 1)  Please schedule a follow-up appointment in 4 months. 2)  Tobacco is very bad for your health and your loved ones! You Should stop smoking!. 3)  Stop Smoking Tips: Choose a Quit date. Cut down before the Quit date. decide what you will do as a substitute when you feel the urge to smoke(gum,toothpick,exercise). 4)  It is important that you exercise regularly at least 20 minutes 5 times a week. If you develop chest pain, have severe difficulty breathing, or feel very tired , stop exercising immediately and seek medical attention.    Prevention & Chronic Care Immunizations   Influenza vaccine: Not documented    Tetanus booster: Not documented    Pneumococcal vaccine: Not documented    H. zoster vaccine: Not documented  Colorectal Screening   Hemoccult: Not documented    Colonoscopy: Not documented  Other Screening   PSA: Not documented   Smoking status: current  (02/11/2010)   Smoking cessation counseling: yes  (02/11/2010)  Lipids   Total Cholesterol: Not documented   LDL: Not documented   LDL Direct: Not documented   HDL: Not documented   Triglycerides: Not documented    SGOT (AST): Not documented   SGPT (ALT): Not documented   Alkaline phosphatase: Not documented   Total bilirubin: Not documented    Lipid flowsheet reviewed?: Yes   Progress toward LDL goal: At goal  Hypertension   Last Blood Pressure: 105 / 74  (02/11/2010)   Serum creatinine: Not documented   Serum potassium Not documented    Hypertension flowsheet  reviewed?: Yes   Progress toward BP goal: At goal  Self-Management Support :    Patient will work on the following items until the next clinic visit to reach  self-care goals:     Medications and monitoring: take my medicines every day  (02/11/2010)     Eating: eat more vegetables, use fresh or frozen vegetables, eat foods that are low in salt, eat baked foods instead of fried foods, eat fruit for snacks and desserts  (02/11/2010)    Hypertension self-management support: Education handout, Resources for patients handout  (02/11/2010)   Hypertension education handout printed    Lipid self-management support: Education handout, Resources for patients handout  (02/11/2010)     Lipid education handout printed      Resource handout printed.

## 2011-01-19 NOTE — Miscellaneous (Signed)
[facility]: [facility]   Imported By: Margie Billet 04/17/2010 14:53:58  _____________________________________________________________________  External Attachment:    Type:   Image     Comment:   External Document

## 2011-01-19 NOTE — Progress Notes (Signed)
Summary: med refill/gp  Phone Note Refill Request Message from:  Fax from Pharmacy on October 15, 2010 9:59 AM  Refills Requested: Medication #1:  ALBUTEROL SULFATE (2.5 MG/3ML) 0.083% NEBU use one vial in nedbulizer three times a day   Dosage confirmed as above?Dosage Confirmed   Last Refilled: 11/10/2008  Medication #2:  IPRATROPIUM BROMIDE 0.02 % SOLN inhale 1 vial via nebulizer three times a day   Dosage confirmed as above?Dosage Confirmed   Last Refilled: 11/10/2008 Last appt. 02/24/10.   Method Requested: Electronic Initial call taken by: Chinita Pester RN,  October 15, 2010 10:00 AM    Prescriptions: ALBUTEROL SULFATE (2.5 MG/3ML) 0.083% NEBU (ALBUTEROL SULFATE) use one vial in nedbulizer three times a day  #150 x 6   Entered and Authorized by:   Jackson Latino MD   Signed by:   Jackson Latino MD on 10/15/2010   Method used:   Electronically to        [facility] [phone]* (retail)       [address]       [place], [place]  [address]       Ph: [phone]       Fax: [phone]   RxID:   [id number] IPRATROPIUM BROMIDE 0.02 % SOLN (IPRATROPIUM BROMIDE) inhale 1 vial via nebulizer three times a day  #150 x 6   Entered and Authorized by:   Jackson Latino MD   Signed by:   Jackson Latino MD on 10/15/2010   Method used:   Electronically to        [facility] [phone]* (retail)       [address]       [place], [place]  [address]       Ph: [phone]       Fax: [phone]   RxID:   [id number]

## 2011-01-19 NOTE — Miscellaneous (Signed)
Summary: Home Health Certification/Care Plan  Home Health Certification/Care Plan   Imported By: Roderic Ovens 03/23/2010 12:02:04  _____________________________________________________________________  External Attachment:    Type:   Image     Comment:   External Document

## 2011-01-19 NOTE — Letter (Signed)
Summary: [facility]   Imported By: Roderic Ovens 09/17/2010 11:21:29  _____________________________________________________________________  External Attachment:    Type:   Image     Comment:   External Document

## 2011-01-19 NOTE — Assessment & Plan Note (Signed)
Summary: Hospital d/c: 02/04/2010 - COPD exacerbation    ---- 01/03/2009 2:03 PM, Chauncey Reading DO wrote: Hospital Discharge  Date of admission: 01/30/2010  Date of discharge: 02/04/2010  Brief reason for admission/active problems: COPD exacerbation, resolved on d/c. Discharged on doxycycline for 5 more days and prednisone taper which he ws supposed to take only 1 tablet 10 mg.   Followup needed: Pt has follow upappointment on 02/11/2010 at 2 pm with Dr. Aldine Contes. On follow up appointment please assess following:  1) reasses if pt's COPD has been well controlled on current regimen and see if patient has completed his abx 2) follow up on dobutamine myoview which was done on discharge  Prescriptions: PRAVASTATIN SODIUM 40 MG TABS (PRAVASTATIN SODIUM) one by mouth at bedtime  #30 Each x 10   Entered and Authorized by:   Mliss Sax MD   Signed by:   Mliss Sax MD on 02/04/2010   Method used:   Electronically to        [facility] [phone]* (retail)       [address]       [place], [place]  [address]       Ph: [phone]       Fax: [phone]   RxID:   [id number] IPRATROPIUM BROMIDE 0.02 % SOLN (IPRATROPIUM BROMIDE) inhale 1 vial via nebulizer three times a day  #150 x 6   Entered and Authorized by:   Mliss Sax MD   Signed by:   Mliss Sax MD on 02/04/2010   Method used:   Electronically to        [facility] [phone]* (retail)       [address]       [place], [place]  [address]       Ph: [phone]       Fax: [phone]   RxID:   [id number] ALBUTEROL SULFATE (2.5 MG/3ML) 0.083% NEBU (ALBUTEROL SULFATE) use one vial in nedbulizer three times a day  #150 x 6   Entered and Authorized by:   Mliss Sax MD   Signed by:   Mliss Sax MD on 02/04/2010   Method used:   Electronically to        [facility] [phone]* (retail)       [address]       [place], [place]  [address]       Ph: [phone]       Fax: [phone]   RxID:    [id number] PLAVIX 75 MG TABS (CLOPIDOGREL BISULFATE) daily  #30 x 6   Entered and Authorized by:   Mliss Sax MD   Signed by:   Mliss Sax MD on 02/04/2010   Method used:   Electronically to        [facility] [phone]* (retail)       [address]       [place], [place]  [address]       Ph: [phone]       Fax: [phone]   RxID:   [id number] DOXYCYCLINE HYCLATE 100 MG TABS (DOXYCYCLINE HYCLATE) Take 1 tablet by mouth two times a day  #10 x 0   Entered and Authorized by:   Mliss Sax MD   Signed by:   Mliss Sax MD on 02/04/2010   Method used:   Electronically to        [facility] [phone]* (retail)       [address]  [place], [place]  [address]       Ph: [phone]       Fax: [phone]   RxID:   [id number] COLACE 100 MG CAPS (DOCUSATE SODIUM) Take 1 tablet by mouth two times a day  #60 x 3   Entered and Authorized by:   Mliss Sax MD   Signed by:   Mliss Sax MD on 02/04/2010   Method used:   Electronically to        [facility] [phone]* (retail)       [address]       [place], [place]  [address]       Ph: [phone]       Fax: [phone]   RxID:   [id number] METOPROLOL TARTRATE 50 MG TABS (METOPROLOL TARTRATE) Take 1 tablet by mouth two times a day  #60 x 3   Entered and Authorized by:   Mliss Sax MD   Signed by:   Mliss Sax MD on 02/04/2010   Method used:   Electronically to        [facility] [phone]* (retail)       [address]       [place], [place]  [address]       Ph: [phone]       Fax: [phone]   RxID:   [id number]     The medication and problem lists have been updated.  Please see the dictated discharge summary for details.   Patient Instructions: 1)  You have follow upappointment on Feb 23rd with Dr. Aldine Contes at 2 pm. 2)  Check your Blood Pressure regularly. If it is above 170: you should make an appointment.

## 2011-01-19 NOTE — Consult Note (Signed)
[facility]   Imported By: Roderic Ovens 02/17/2010 14:31:34  _____________________________________________________________________  External Attachment:    Type:   Image     Comment:   External Document

## 2011-01-19 NOTE — Miscellaneous (Signed)
  Clinical Lists Changes  Observations: Added new observation of RS STUDY: TRACER - study completion2/2/11 (03/17/2010 11:04)      Research Study Name: TRACER - study completion2/2/11

## 2011-01-19 NOTE — Miscellaneous (Signed)
Summary: HIPAA Restrictions  HIPAA Restrictions   Imported By: Florinda Marker 02/11/2010 15:33:07  _____________________________________________________________________  External Attachment:    Type:   Image     Comment:   External Document

## 2011-01-19 NOTE — Progress Notes (Signed)
  FAxed All Cardiac over to [facility] to fax [phone] [facility]  March 25, 2010 12:44 PM

## 2011-01-21 NOTE — Progress Notes (Addendum)
Summary: refill on med  Phone Note Refill Request Call back at [phone] Message from:  Patient on January 11, 2011 11:59 AM  Refills Requested: Medication #1:  PLAVIX 75 MG TABS daily bristol meyers.    Method Requested: Fax to [facility] Initial call taken by: Lorne Skeens,  January 11, 2011 11:59 AM  Follow-up for Phone Call        called will send RX.Pt notified. Marrion Coy, CNA  January 15, 2011 9:11 AM  Follow-up by: Marrion Coy, CNA,  January 15, 2011 9:11 AM     Appended Document: refill on med Plavix sample here and pt aware they are available at the front desk

## 2011-01-23 ENCOUNTER — Inpatient Hospital Stay (HOSPITAL_COMMUNITY)
Admission: EM | Admit: 2011-01-23 | Discharge: 2011-02-02 | DRG: 208 | Disposition: A | Discharge status: Home Health | Payer: Medicare Other | Attending: Pulmonary Disease | Admitting: Pulmonary Disease

## 2011-01-23 ENCOUNTER — Emergency Department (HOSPITAL_COMMUNITY): Payer: Medicare Other

## 2011-01-23 DIAGNOSIS — N179 Acute kidney failure, unspecified: Secondary | ICD-10-CM | POA: Diagnosis not present

## 2011-01-23 DIAGNOSIS — I2510 Atherosclerotic heart disease of native coronary artery without angina pectoris: Secondary | ICD-10-CM | POA: Diagnosis present

## 2011-01-23 DIAGNOSIS — J9600 Acute respiratory failure, unspecified whether with hypoxia or hypercapnia: Secondary | ICD-10-CM | POA: Diagnosis not present

## 2011-01-23 DIAGNOSIS — R079 Chest pain, unspecified: Secondary | ICD-10-CM | POA: Diagnosis present

## 2011-01-23 DIAGNOSIS — J441 Chronic obstructive pulmonary disease with (acute) exacerbation: Principal | ICD-10-CM | POA: Diagnosis present

## 2011-01-23 DIAGNOSIS — F17200 Nicotine dependence, unspecified, uncomplicated: Secondary | ICD-10-CM | POA: Diagnosis present

## 2011-01-23 DIAGNOSIS — K219 Gastro-esophageal reflux disease without esophagitis: Secondary | ICD-10-CM | POA: Diagnosis present

## 2011-01-23 DIAGNOSIS — Z9861 Coronary angioplasty status: Secondary | ICD-10-CM

## 2011-01-23 DIAGNOSIS — T380X5A Adverse effect of glucocorticoids and synthetic analogues, initial encounter: Secondary | ICD-10-CM | POA: Diagnosis not present

## 2011-01-23 DIAGNOSIS — R7309 Other abnormal glucose: Secondary | ICD-10-CM | POA: Diagnosis not present

## 2011-01-23 DIAGNOSIS — J129 Viral pneumonia, unspecified: Secondary | ICD-10-CM | POA: Diagnosis present

## 2011-01-24 LAB — BLOOD GAS, VENOUS
O2 Saturation: 89.5 %
pCO2, Ven: 37 mmHg — ABNORMAL LOW (ref 45.0–50.0)
pH, Ven: 7.456 — ABNORMAL HIGH (ref 7.250–7.300)
pO2, Ven: 51.3 mmHg — ABNORMAL HIGH (ref 30.0–45.0)

## 2011-01-24 LAB — CK TOTAL AND CKMB (NOT AT ARMC)
CK, MB: 3.3 ng/mL (ref 0.3–4.0)
Relative Index: INVALID (ref 0.0–2.5)

## 2011-01-24 LAB — POCT I-STAT, CHEM 8
BUN: 13 mg/dL (ref 6–23)
Calcium, Ion: 1.04 mmol/L — ABNORMAL LOW (ref 1.12–1.32)
Chloride: 105 mEq/L (ref 96–112)
Creatinine, Ser: 1.1 mg/dL (ref 0.4–1.5)
HCT: 47 % (ref 39.0–52.0)
Hemoglobin: 16 g/dL (ref 13.0–17.0)
Potassium: 3.8 mEq/L (ref 3.5–5.1)
Sodium: 138 mEq/L (ref 135–145)
TCO2: 27 mmol/L (ref 0–100)

## 2011-01-24 LAB — CBC
HCT: 40.9 % (ref 39.0–52.0)
HCT: 42.6 % (ref 39.0–52.0)
Hemoglobin: 14.4 g/dL (ref 13.0–17.0)
Hemoglobin: 14.6 g/dL (ref 13.0–17.0)
MCH: 29.2 pg (ref 26.0–34.0)
MCHC: 34.3 g/dL (ref 30.0–36.0)
MCV: 84.7 fL (ref 78.0–100.0)
Platelets: 160 10*3/uL (ref 150–400)
RBC: 4.83 MIL/uL (ref 4.22–5.81)
WBC: 9.6 10*3/uL (ref 4.0–10.5)

## 2011-01-24 LAB — POCT CARDIAC MARKERS
CKMB, poc: 1.3 ng/mL (ref 1.0–8.0)
Myoglobin, poc: 59 ng/mL (ref 12–200)
Troponin i, poc: <0.05 ng/mL (ref 0.00–0.09)

## 2011-01-24 LAB — DIFFERENTIAL
Basophils Absolute: 0 10*3/uL (ref 0.0–0.1)
Basophils Relative: 0 % (ref 0–1)
Basophils Relative: 0 % (ref 0–1)
Eosinophils Absolute: 0 10*3/uL (ref 0.0–0.7)
Eosinophils Relative: 0 % (ref 0–5)
Eosinophils Relative: 0 % (ref 0–5)
Lymphocytes Relative: 8 % — ABNORMAL LOW (ref 12–46)
Lymphs Abs: 0.8 10*3/uL (ref 0.7–4.0)
Monocytes Absolute: 0.2 10*3/uL (ref 0.1–1.0)
Monocytes Relative: 2 % — ABNORMAL LOW (ref 3–12)
Neutro Abs: 7.7 10*3/uL (ref 1.7–7.7)
Neutro Abs: 8 10*3/uL — ABNORMAL HIGH (ref 1.7–7.7)
Neutrophils Relative %: 83 % — ABNORMAL HIGH (ref 43–77)

## 2011-01-24 LAB — COMPREHENSIVE METABOLIC PANEL
CO2: 26 mEq/L (ref 19–32)
Calcium: 9.5 mg/dL (ref 8.4–10.5)
Creatinine, Ser: 1.05 mg/dL (ref 0.4–1.5)
GFR calc Af Amer: >60 mL/min (ref >60)
GFR calc non Af Amer: >60 mL/min (ref >60)
Glucose, Bld: 135 mg/dL — ABNORMAL HIGH (ref 70–99)

## 2011-01-24 LAB — BRAIN NATRIURETIC PEPTIDE: Pro B Natriuretic peptide (BNP): <30 pg/mL (ref 0.0–100.0)

## 2011-01-24 LAB — CARDIAC PANEL(CRET KIN+CKTOT+MB+TROPI)
CK, MB: 3.6 ng/mL (ref 0.3–4.0)
Total CK: 79 U/L (ref 7–232)

## 2011-01-24 LAB — TROPONIN I: Troponin I: <0.01 ng/mL (ref 0.00–0.06)

## 2011-01-25 ENCOUNTER — Inpatient Hospital Stay (HOSPITAL_COMMUNITY): Payer: Medicare Other

## 2011-01-25 LAB — COMPREHENSIVE METABOLIC PANEL
ALT: 12 U/L (ref 0–53)
AST: 16 U/L (ref 0–37)
Alkaline Phosphatase: 45 U/L (ref 39–117)
Glucose, Bld: 164 mg/dL — ABNORMAL HIGH (ref 70–99)
Potassium: 4.2 mEq/L (ref 3.5–5.1)
Sodium: 143 mEq/L (ref 135–145)
Total Protein: 5.7 g/dL — ABNORMAL LOW (ref 6.0–8.3)

## 2011-01-25 LAB — BLOOD GAS, ARTERIAL
Acid-Base Excess: 3.2 mmol/L — ABNORMAL HIGH (ref 0.0–2.0)
Bicarbonate: 29.4 mEq/L — ABNORMAL HIGH (ref 20.0–24.0)
Delivery systems: POSITIVE
Drawn by: 129711
Drawn by: 340271
Expiratory PAP: 5
Inspiratory PAP: 13
Mode: POSITIVE
O2 Content: 5 L/min
O2 Saturation: 98.1 %
O2 Saturation: 99.5 %
Patient temperature: 98.6
TCO2: 26 mmol/L (ref 0–100)
pH, Arterial: 7.369 (ref 7.350–7.450)
pO2, Arterial: 104 mmHg — ABNORMAL HIGH (ref 80.0–100.0)

## 2011-01-25 LAB — DIFFERENTIAL
Basophils Absolute: 0 10*3/uL (ref 0.0–0.1)
Eosinophils Relative: 0 % (ref 0–5)
Lymphocytes Relative: 5 % — ABNORMAL LOW (ref 12–46)
Neutro Abs: 6.1 10*3/uL (ref 1.7–7.7)

## 2011-01-25 LAB — CBC
HCT: 38.1 % — ABNORMAL LOW (ref 39.0–52.0)
Hemoglobin: 12.9 g/dL — ABNORMAL LOW (ref 13.0–17.0)
RDW: 14.5 % (ref 11.5–15.5)
WBC: 6.8 10*3/uL (ref 4.0–10.5)

## 2011-01-26 ENCOUNTER — Inpatient Hospital Stay (HOSPITAL_COMMUNITY): Payer: Medicare Other

## 2011-01-26 DIAGNOSIS — R0602 Shortness of breath: Secondary | ICD-10-CM

## 2011-01-26 DIAGNOSIS — J441 Chronic obstructive pulmonary disease with (acute) exacerbation: Secondary | ICD-10-CM

## 2011-01-26 DIAGNOSIS — J9600 Acute respiratory failure, unspecified whether with hypoxia or hypercapnia: Secondary | ICD-10-CM

## 2011-01-26 LAB — BLOOD GAS, ARTERIAL
Acid-Base Excess: 4.1 mmol/L — ABNORMAL HIGH (ref 0.0–2.0)
Bicarbonate: 30.5 mEq/L — ABNORMAL HIGH (ref 20.0–24.0)
Bicarbonate: 32.4 mEq/L — ABNORMAL HIGH (ref 20.0–24.0)
FIO2: 0.7 %
Mode: POSITIVE
O2 Saturation: 99.9 %
PEEP: 5 cmH2O
TCO2: 27.1 mmol/L (ref 0–100)
pCO2 arterial: 55.3 mmHg — ABNORMAL HIGH (ref 35.0–45.0)
pCO2 arterial: 65.4 mmHg (ref 35.0–45.0)
pH, Arterial: 7.361 (ref 7.350–7.450)
pO2, Arterial: 363 mmHg — ABNORMAL HIGH (ref 80.0–100.0)

## 2011-01-26 LAB — DIFFERENTIAL
Basophils Absolute: 0 10*3/uL (ref 0.0–0.1)
Basophils Relative: 0 % (ref 0–1)
Eosinophils Relative: 0 % (ref 0–5)
Monocytes Absolute: 0.8 10*3/uL (ref 0.1–1.0)
Monocytes Relative: 9 % (ref 3–12)

## 2011-01-26 LAB — PROTIME-INR
INR: 0.89 (ref 0.00–1.49)
Prothrombin Time: 12.3 seconds (ref 11.6–15.2)

## 2011-01-26 LAB — CBC
HCT: 41.6 % (ref 39.0–52.0)
MCH: 29.8 pg (ref 26.0–34.0)
MCHC: 34.1 g/dL (ref 30.0–36.0)
RDW: 14.7 % (ref 11.5–15.5)

## 2011-01-26 LAB — BASIC METABOLIC PANEL
CO2: 30 mEq/L (ref 19–32)
Calcium: 8.9 mg/dL (ref 8.4–10.5)
Creatinine, Ser: 0.77 mg/dL (ref 0.4–1.5)
GFR calc Af Amer: >60 mL/min (ref >60)
GFR calc non Af Amer: >60 mL/min (ref >60)
Glucose, Bld: 109 mg/dL — ABNORMAL HIGH (ref 70–99)
Sodium: 142 mEq/L (ref 135–145)

## 2011-01-26 LAB — APTT: aPTT: 32 seconds (ref 24–37)

## 2011-01-26 LAB — TROPONIN I: Troponin I: 0.01 ng/mL (ref 0.00–0.06)

## 2011-01-26 LAB — PHOSPHORUS: Phosphorus: 3.4 mg/dL (ref 2.3–4.6)

## 2011-01-26 LAB — MAGNESIUM: Magnesium: 2.5 mg/dL (ref 1.5–2.5)

## 2011-01-26 LAB — BRAIN NATRIURETIC PEPTIDE: Pro B Natriuretic peptide (BNP): 70.4 pg/mL (ref 0.0–100.0)

## 2011-01-26 MED ORDER — IOHEXOL 300 MG/ML  SOLN: 100.0000 mL | Freq: Once | INTRAMUSCULAR | Status: AC | PRN

## 2011-01-27 ENCOUNTER — Inpatient Hospital Stay (HOSPITAL_COMMUNITY): Payer: Medicare Other

## 2011-01-27 LAB — CARDIOLIPIN ANTIBODIES, IGG, IGM, IGA
Anticardiolipin IgA: 1 APL U/mL — ABNORMAL LOW (ref <22)
Anticardiolipin IgG: 1 GPL U/mL — ABNORMAL LOW (ref <23)
Anticardiolipin IgM: 7 MPL U/mL — ABNORMAL LOW (ref <11)

## 2011-01-27 LAB — BLOOD GAS, ARTERIAL
Acid-Base Excess: 6.9 mmol/L — ABNORMAL HIGH (ref 0.0–2.0)
Acid-Base Excess: 8 mmol/L — ABNORMAL HIGH (ref 0.0–2.0)
Bicarbonate: 33.3 mEq/L — ABNORMAL HIGH (ref 20.0–24.0)
Drawn by: 103701
FIO2: 0.4 %
FIO2: 0.3 %
MECHVT: 600 mL
O2 Saturation: 99.8 %
Patient temperature: 98.6
RATE: 12 resp/min
TCO2: 29.6 mmol/L (ref 0–100)
pCO2 arterial: 54.8 mmHg — ABNORMAL HIGH (ref 35.0–45.0)
pH, Arterial: 7.41 (ref 7.350–7.450)
pO2, Arterial: 211 mmHg — ABNORMAL HIGH (ref 80.0–100.0)
pO2, Arterial: 90.1 mmHg (ref 80.0–100.0)

## 2011-01-27 LAB — DIFFERENTIAL
Basophils Absolute: 0 10*3/uL (ref 0.0–0.1)
Basophils Relative: 0 % (ref 0–1)
Eosinophils Absolute: 0 10*3/uL (ref 0.0–0.7)
Eosinophils Relative: 0 % (ref 0–5)
Lymphocytes Relative: 5 % — ABNORMAL LOW (ref 12–46)
Monocytes Absolute: 0.4 10*3/uL (ref 0.1–1.0)

## 2011-01-27 LAB — CBC
HCT: 39 % (ref 39.0–52.0)
MCHC: 32.8 g/dL (ref 30.0–36.0)
Platelets: 151 10*3/uL (ref 150–400)
RDW: 14.9 % (ref 11.5–15.5)
WBC: 6.9 10*3/uL (ref 4.0–10.5)

## 2011-01-27 LAB — BASIC METABOLIC PANEL
BUN: 17 mg/dL (ref 6–23)
Chloride: 99 mEq/L (ref 96–112)
GFR calc non Af Amer: >60 mL/min (ref >60)
Glucose, Bld: 148 mg/dL — ABNORMAL HIGH (ref 70–99)
Potassium: 4.6 mEq/L (ref 3.5–5.1)
Sodium: 142 mEq/L (ref 135–145)

## 2011-01-28 ENCOUNTER — Inpatient Hospital Stay (HOSPITAL_COMMUNITY): Payer: Medicare Other

## 2011-01-28 LAB — BASIC METABOLIC PANEL
BUN: 22 mg/dL (ref 6–23)
CO2: 33 mEq/L — ABNORMAL HIGH (ref 19–32)
Calcium: 8.6 mg/dL (ref 8.4–10.5)
Creatinine, Ser: 0.79 mg/dL (ref 0.4–1.5)
GFR calc non Af Amer: >60 mL/min (ref >60)
Glucose, Bld: 149 mg/dL — ABNORMAL HIGH (ref 70–99)

## 2011-01-28 LAB — CBC
HCT: 37.9 % — ABNORMAL LOW (ref 39.0–52.0)
Hemoglobin: 12.4 g/dL — ABNORMAL LOW (ref 13.0–17.0)
MCH: 28.5 pg (ref 26.0–34.0)
MCHC: 32.7 g/dL (ref 30.0–36.0)
MCV: 87.1 fL (ref 78.0–100.0)
RDW: 14.2 % (ref 11.5–15.5)

## 2011-01-28 LAB — GLUCOSE, CAPILLARY
Glucose-Capillary: 151 mg/dL — ABNORMAL HIGH (ref 70–99)
Glucose-Capillary: 177 mg/dL — ABNORMAL HIGH (ref 70–99)
Glucose-Capillary: 187 mg/dL — ABNORMAL HIGH (ref 70–99)
Glucose-Capillary: 118 mg/dL — ABNORMAL HIGH (ref 70–99)
Glucose-Capillary: 136 mg/dL — ABNORMAL HIGH (ref 70–99)

## 2011-01-28 LAB — BLOOD GAS, ARTERIAL
Bicarbonate: 32.9 mEq/L — ABNORMAL HIGH (ref 20.0–24.0)
PEEP: 5 cmH2O
Patient temperature: 98.6
pCO2 arterial: 54.3 mmHg — ABNORMAL HIGH (ref 35.0–45.0)
pH, Arterial: 7.399 (ref 7.350–7.450)
pO2, Arterial: 83.7 mmHg (ref 80.0–100.0)

## 2011-01-29 ENCOUNTER — Inpatient Hospital Stay (HOSPITAL_COMMUNITY): Payer: Medicare Other

## 2011-01-29 LAB — GLUCOSE, CAPILLARY
Glucose-Capillary: 178 mg/dL — ABNORMAL HIGH (ref 70–99)
Glucose-Capillary: 135 mg/dL — ABNORMAL HIGH (ref 70–99)

## 2011-01-29 LAB — CBC
HCT: 36.4 % — ABNORMAL LOW (ref 39.0–52.0)
Hemoglobin: 11.9 g/dL — ABNORMAL LOW (ref 13.0–17.0)
MCH: 28.5 pg (ref 26.0–34.0)
MCV: 87.1 fL (ref 78.0–100.0)
Platelets: 133 10*3/uL — ABNORMAL LOW (ref 150–400)
RBC: 4.18 MIL/uL — ABNORMAL LOW (ref 4.22–5.81)
WBC: 5.7 10*3/uL (ref 4.0–10.5)

## 2011-01-29 LAB — CULTURE, BAL-QUANTITATIVE W GRAM STAIN: Colony Count: 7000

## 2011-01-29 LAB — BASIC METABOLIC PANEL
BUN: 25 mg/dL — ABNORMAL HIGH (ref 6–23)
CO2: 33 mEq/L — ABNORMAL HIGH (ref 19–32)
Chloride: 104 mEq/L (ref 96–112)
Potassium: 4 mEq/L (ref 3.5–5.1)

## 2011-01-30 ENCOUNTER — Inpatient Hospital Stay (HOSPITAL_COMMUNITY): Payer: Medicare Other

## 2011-01-30 LAB — GLUCOSE, CAPILLARY
Glucose-Capillary: 150 mg/dL — ABNORMAL HIGH (ref 70–99)
Glucose-Capillary: 242 mg/dL — ABNORMAL HIGH (ref 70–99)
Glucose-Capillary: 176 mg/dL — ABNORMAL HIGH (ref 70–99)
Glucose-Capillary: 191 mg/dL — ABNORMAL HIGH (ref 70–99)

## 2011-01-30 LAB — SODIUM, URINE, RANDOM: Sodium, Ur: 138 mEq/L

## 2011-01-30 LAB — BASIC METABOLIC PANEL
BUN: 19 mg/dL (ref 6–23)
Calcium: 8.3 mg/dL — ABNORMAL LOW (ref 8.4–10.5)
Creatinine, Ser: 0.69 mg/dL (ref 0.4–1.5)
GFR calc non Af Amer: >60 mL/min (ref >60)
Glucose, Bld: 150 mg/dL — ABNORMAL HIGH (ref 70–99)
Potassium: 4 mEq/L (ref 3.5–5.1)

## 2011-01-30 LAB — CREATININE, URINE, RANDOM: Creatinine, Urine: 72.7 mg/dL

## 2011-01-31 LAB — GLUCOSE, CAPILLARY
Glucose-Capillary: 203 mg/dL — ABNORMAL HIGH (ref 70–99)
Glucose-Capillary: 72 mg/dL (ref 70–99)

## 2011-02-01 ENCOUNTER — Inpatient Hospital Stay (HOSPITAL_COMMUNITY): Payer: Medicare Other

## 2011-02-01 LAB — GLUCOSE, CAPILLARY
Glucose-Capillary: 117 mg/dL — ABNORMAL HIGH (ref 70–99)
Glucose-Capillary: 116 mg/dL — ABNORMAL HIGH (ref 70–99)
Glucose-Capillary: 190 mg/dL — ABNORMAL HIGH (ref 70–99)

## 2011-02-01 LAB — BASIC METABOLIC PANEL
BUN: 19 mg/dL (ref 6–23)
GFR calc non Af Amer: >60 mL/min (ref >60)
Glucose, Bld: 149 mg/dL — ABNORMAL HIGH (ref 70–99)
Potassium: 4.1 mEq/L (ref 3.5–5.1)

## 2011-02-01 LAB — CBC
Hemoglobin: 12.5 g/dL — ABNORMAL LOW (ref 13.0–17.0)
MCH: 28.7 pg (ref 26.0–34.0)
MCHC: 33.5 g/dL (ref 30.0–36.0)

## 2011-02-12 NOTE — Discharge Summary (Signed)
NAME:  [name], [name]                ACCOUNT NO.:  [id number]  MEDICAL RECORD NO.:  [id number]           PATIENT TYPE:  I  LOCATION:  1333                         FACILITY:  [facility]  PHYSICIAN:  Kathlen Mody, MD       DATE OF BIRTH:  Dec 07, 1951  DATE OF ADMISSION:  01/23/2011 DATE OF DISCHARGE:  02/02/2011                              DISCHARGE SUMMARY   DISCHARGE DIAGNOSIS:  Chronic obstructive pulmonary disease exacerbation.  OTHER DIAGNOSES: 1. Coronary artery disease, status post percutaneous transluminal     coronary angioplasty. 2. Hypertension. 3. Hyperlipidemia. 4. Tobacco dependence. 5. Gastroesophageal reflux disease.  DISCHARGE MEDICATIONS: 1. Tapering steroids. 2. Tylenol 325 mg to 650 mg every 4 hours as needed. 3. Symbicort inhalation 2 puffs twice daily. 4. Guanfacine 600 mg 1 tablet by mouth twice daily. 5. Spiriva 18 mcg inhalation daily. 6. Ultram 50 mg every 8 hours for about 7 days. 7. Albuterol nebulizer 4 times daily as needed. 8. Aspirin 325 mg 1 tablet daily. 9. Nitroglycerin sublingual 0.4 mg for chest pain as needed. 10.Plavix 75 mg 1 tablet daily. 11.Lovastatin 40 mg 1 tablet daily at bedtime.  PERTINENT LABORATORIES:  CBC on admission was normal.  Sodium was 138, potassium 3.8, chloride 105, bicarb 27, creatinine 1.1, and BUN 13. Point-of-care cardiac markers were negative.  Next set of point-of-care cardiac markers were also negative.  B-natriuretic peptide was less than 30.  MRSA screen negative.  D-dimer was negative.  The day of discharge, the patient's CBC showed a hemoglobin of 12.5, hematocrit of 37.3, and platelets of 143,000.  The BMP with metabolic panel was pretty much normal except for a glucose of 149, magnesium was 2.3.  RADIOLOGY:  Chest x-ray on January 24, 2011, showed no acute cardiopulmonary abnormalities.  CT angiogram on January 27, 2011, negative for embolus, COPD changes, coronary artery atherosclerosis. Repeat chest  x-ray on February 01, 2011, no active disease, evidence of pulmonary hyperinflation and perihilar __________  with the coronary artery stents.  CONSULTS CALLED:  Pulmonary consult was called for COPD.  BRIEF HOSPITAL COURSE:  This is a 60 year old gentleman with a history of COPD, on home oxygen and coronary artery disease admitted for shortness of breath.  The patient was intermittently using oxygen and was smoking at home.  The patient was admitted for COPD exacerbation. He was started on IV Solu-Medrol, Avelox, Spiriva, Symbicort, and albuterol nebulizers q.6 h. p.r.n., was counseled on smoking cessation for about 10 minutes.  The patient was admitted to telemetry.  Cardiac enzymes were cycled.  Cardiac enzymes came back negative.  Pulmonary consult was called for COPD exacerbation.  The patient was also found to have a viral pneumonitis superimposed on COPD exacerbation.  Over the days of hospitalization, his shortness of breath has gradually improved and he was asked to use portable oxygen continuously at 2-3 L to keep oxygen saturation more than 92%.  His IV Solu-Medrol was gradually decreased and was changed to p.o. prednisone at 60 mg on the day of discharge and was given prescriptions of tapered prednisone with a maintenance dose of 10 mg daily.  He  was asked to follow with his pulmonologist in about 1-2 weeks after discharge.  He was counseled on smoking cessation, was asked to keep his saturation more than 92%.  Coronary artery disease.  His aspirin and Plavix were continued.  His cardiac enzymes were negative.  EKG was unchanged.  Gastroesophageal reflux disease.  Protonix was continued.  PHYSICAL EXAMINATION:  VITAL SIGNS:  On the day of discharge, the patient's vitals were temperature of 98.1, pulse of 78 per minute, respirations of 20 per minute, blood pressure of 129/75, and saturating 94% on 3 L of oxygen. GENERAL:  He was alert, afebrile, and oriented x3, no acute  distress. CARDIOVASCULAR EXAM:  S1-S2 heard. RESPIRATORY EXAM:  Good air entry bilateral. ABDOMEN:  Soft, nontender, and nondistended.  Good bowel sounds. EXTREMITIES:  No pedal edema.  The patient was discharged home to follow up with his PCP in about 1-2 weeks and with pulmonologist, Dr. Delton Coombes in about 1-2 weeks.          ______________________________ Kathlen Mody, MD     VA/MEDQ  D:  02/09/2011  T:  02/10/2011  Job:  [id number]  Electronically Signed by Kathlen Mody MD on 02/12/2011 12:17:41 AM

## 2011-02-17 ENCOUNTER — Telehealth (INDEPENDENT_AMBULATORY_CARE_PROVIDER_SITE_OTHER): Payer: Self-pay | Admitting: *Deleted

## 2011-02-18 ENCOUNTER — Encounter: Payer: Self-pay | Admitting: Adult Health

## 2011-02-18 ENCOUNTER — Ambulatory Visit (INDEPENDENT_AMBULATORY_CARE_PROVIDER_SITE_OTHER): Payer: Self-pay | Admitting: Adult Health

## 2011-02-18 DIAGNOSIS — J449 Chronic obstructive pulmonary disease, unspecified: Secondary | ICD-10-CM

## 2011-02-19 ENCOUNTER — Telehealth (INDEPENDENT_AMBULATORY_CARE_PROVIDER_SITE_OTHER): Payer: Self-pay | Admitting: *Deleted

## 2011-02-23 ENCOUNTER — Inpatient Hospital Stay: Payer: Medicare Other | Admitting: Emergency Medicine

## 2011-02-24 ENCOUNTER — Telehealth: Payer: Self-pay | Admitting: Emergency Medicine

## 2011-02-25 NOTE — Progress Notes (Signed)
Summary: Prescription and refill  Phone Note Call from Patient Call back at Home Phone [phone] P PH     Caller: Patient Reason for Call: Refill Medication Summary of Call: Needs new prescription for ibuprofen bromide, also refill for pravastatin, sent to [facility] on [address]. Initial call taken by: Leonette Monarch,  February 19, 2011 3:06 PM  Follow-up for Phone Call        Spoke with pt and advised that rx for pravastatin should be filled by his PCP and the refill on ipratropium was sent to pharm at the time of his ov.  Pt verbalized understanding.  Follow-up by: Vernie Murders,  February 19, 2011 4:22 PM

## 2011-02-25 NOTE — Progress Notes (Signed)
Summary: wheezing  Phone Note From Other Clinic   Caller: cindy- nurse w/ [facility] Call For: byrum Summary of Call: pt has HFU w/ RB on 02/23/11. nurse cindy calling to report wheezing (COPD pt). w/ pain in R side- lung. no fever. call pt at his new # at home as he is "very alert" and knows his meds/ conditon "very well" per cindy. pt # [id number]. cindy's # is [phone] Initial call taken by: Tivis Ringer, CNA,  February 17, 2011 12:04 PM  Follow-up for Phone Call        Spoke with pt.  He states has been wheezing for the past several days and had some "lung pain" with deep inspiration.  Will move up HFU.  Pt sched to see TP tommorrow pm 3/1 at 3:30 pm. Advised ED sooner if needed. Pt verbalized understanding.  Follow-up by: Vernie Murders,  February 17, 2011 2:05 PM

## 2011-03-02 NOTE — Assessment & Plan Note (Signed)
Summary: NP follow up - post hosp   Primary Provider/Referring Provider:  Jackson Latino MD  CC:  post hosp - c/o wheezing, tightness in chest, pain with deep inspiration, and dry cough x3days.  History of Present Illness: 60 yo male with known hx of COPD    February 18, 2011 --Presents for post hospital follow up. Admitted 2/4-2/14/12 for COPD exacerbation. Tx with IV abx, steroids and Nebs. Felt to have a viral pneumonitis. Discharged on steroid taper. He was started on Spiriva and Symbicort at discharge. He does not have insurance and is a [facility] pt. He can not afford this rx. Gets Budesonide via [facility]. Previous got duoneb via pharmacy on $4 list.  Since discharge he is feeling better but still has some dry cough and pain on deep inspiration. Still smoking. Denies chest pain, orthopnea, hemoptysis, fever, n/v/d, edema, headache.   Medications Prior to Update: 1)  Albuterol Sulfate (2.5 Mg/49ml) 0.083% Nebu (Albuterol Sulfate) .... Use One Vial in [facility] Three Times A Day 2)  Tracer Study .... Daily 3)  Ipratropium Bromide 0.02 % Soln (Ipratropium Bromide) .... Inhale 1 Vial Via Nebulizer Three Times A Day 4)  Nitroglycerin 0.4 Mg Subl (Nitroglycerin) .... Place 1 Tablet Under Tongue As Directed 5)  Plavix 75 Mg Tabs (Clopidogrel Bisulfate) .... Daily 6)  Metoprolol Tartrate 50 Mg Tabs (Metoprolol Tartrate) .... Take 1 Tablet By Mouth Two Times A Day 7)  Pravastatin Sodium 40 Mg Tabs (Pravastatin Sodium) .... One By Mouth At Bedtime 8)  Aspirin 325 Mg  Tabs (Aspirin) .... Daily 9)  Flovent Hfa 110 Mcg/act Aero (Fluticasone Propionate  Hfa) .... Take 2 Puffs Two Times A Day 10)  Colace 100 Mg Caps (Docusate Sodium) .... Take 1 Tablet By Mouth Two Times A Day 11)  Furosemide 20 Mg Tabs (Furosemide) .... Take 1 Tablet By Mouth Once A Day  Current Medications (verified): 1)  Tracer Study .... Daily 2)  Nitroglycerin 0.4 Mg Subl (Nitroglycerin) .... Place 1 Tablet Under Tongue As  Directed 3)  Plavix 75 Mg Tabs (Clopidogrel Bisulfate) .... Daily 4)  Albuterol Sulfate (2.5 Mg/79ml) 0.083% Nebu (Albuterol Sulfate) .... Use One Vial in [facility] Three Times A Day 5)  Pravastatin Sodium 40 Mg Tabs (Pravastatin Sodium) .... One By Mouth At Bedtime 6)  Aspirin 325 Mg  Tabs (Aspirin) .... Daily 7)  Colace 100 Mg Caps (Docusate Sodium) .... Take 1 Tablet By Mouth Two Times A Day 8)  Furosemide 20 Mg Tabs (Furosemide) .... Take 1 Tablet By Mouth Once A Day 9)  Tylenol 325 Mg Tabs (Acetaminophen) .... Per Bottle As Needed 10)  Pulmicort 0.25 Mg/45ml Susp (Budesonide) .... Inhale 1 Vial in [facility] Two Times A Day 11)  Mucinex 600 Mg Xr12h-Tab (Guaifenesin) .... Take 1-2 Tablets Every 12 Hours As Needed 12)  Spiriva Handihaler 18 Mcg Caps (Tiotropium Bromide Monohydrate) .... Inhale Contents of 1 Capsule Once A Day 13)  Ventolin Hfa 108 (90 Base) Mcg/act Aers (Albuterol Sulfate) .... Inhale 2 Puffs Every Four Hours As Needed 14)  Oxygen 2l/min .... Wear 2l/min At Rest and Increase To 3l/min W/ Activity  Allergies (verified): 1)  ! * Liptor  Past History:  Past Medical History: Last updated: 08/07/2009  1. Severe COPD.   2. Coronary artery disease status post multiple stents, most recent       catheterization in July 2009.   3. Hypertension.   4. Hyperlipidemia.      Past Surgical History: Last updated: 08/07/2009 none  Family History: Last updated: 02/18/2011 Significant for hypertension and coronary artery   disease.   Social History: Last updated: 02/18/2011  He has a very heavy tobacco history, used to smoke as   much as 3 packs per day, but now says he only smokes 1-1/2 packs in the   past year.  Denies alcohol or illicit drug use.  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  Lives with   his wife in [place].   Risk Factors: Smoking Status: quit (02/24/2010) Packs/Day: <0.25 (02/24/2010)  Family  History: Significant for hypertension and coronary artery   disease.   Social History:  He has a very heavy tobacco history, used to smoke as   much as 3 packs per day, but now says he only smokes 1-1/2 packs in the   past year.  Denies alcohol or illicit drug use.  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  Lives with   his wife in [place].   Review of Systems      See HPI  Vital Signs:  Patient profile:   60 year old male Height:      71 inches Weight:      193.38 pounds BMI:     27.07 O2 Sat:      95 % on Room air Temp:     97.1 degrees F oral Pulse rate:   99 / minute BP sitting:   104 / 76  (left arm) Cuff size:   regular  Vitals Entered By: Boone Master CNA/MA (February 18, 2011 3:35 PM)  O2 Flow:  Room air CC: post hosp - c/o wheezing, tightness in chest, pain with deep inspiration, dry cough x3days Is Patient Diabetic? No Comments Medications reviewed with patient Daytime contact number verified with patient. Boone Master CNA/MA  February 18, 2011 3:35 PM    Physical Exam  Additional Exam:  Gen: AOx3, in no acute distress Eyes: PERRL, EOMI ZOX:WRUEA pos pharynx  Neck: No JVD, No LAP Chest: coarse BS w/ few scattered rhonchi CVS: regular rhythmic rate, NO M/R/G, S1 S2 normal Abdo: soft,ND, BS+x4, Non tender and No hepatosplenomegaly EXT: No odema noted Neuro: Non focal, gait is normal Skin: no rashes noted.    Impression & Recommendations:  Problem # 1:  COPD (ICD-496)  Recent exacerbation in active smoker.  pt does not have insurance will not be able to afford inhalers unless we can get on med assistance program for now getting budesonide on med assistance and duoneb is affordable via local pharm.  will cont this for now.  Plan:  Stop Symbicort and Spiriva  Restart   Ipratropium and Albuterol Neb three times a day  Restart Pulmicort Neb two times a day  NO SMOKING  Oxygen 2l/m at rest and 3l/m with  walking.  follow up Dr. Delton Coombes in 3-4 weeks  Please contact office for sooner follow up if symptoms do not improve or worsen   Orders: Est. Patient Level III (54098)  Medications Added to Medication List This Visit: 1)  Ipratropium Bromide 0.03 % Soln (Ipratropium bromide) .Marland Kitchen.. 1 neb three times a day 2)  Pulmicort 0.25 Mg/54ml Susp (Budesonide) .... Inhale 1 vial in [facility]hn two times a day 3)  Tylenol 325 Mg Tabs (Acetaminophen) .... Per bottle as needed 4)  Mucinex 600 Mg Xr12h-tab (Guaifenesin) .... Take 1-2 tablets every 12 hours as needed 5)  Spiriva Handihaler 18 Mcg Caps (Tiotropium bromide monohydrate) .... Inhale contents of 1 capsule once a day 6)  Ventolin Hfa 108 (90 Base) Mcg/act Aers (Albuterol sulfate) .... Inhale 2 puffs every four hours as needed 7)  Oxygen 2l/min  .... Wear 2l/min at rest and increase to 3l/min w/ activity  Patient Instructions: 1)  Stop Symbicort and Spiriva  2)  Restart   Ipratropium and Albuterol Neb three times a day  3)  Restart Pulmicort Neb two times a day  4)  NO SMOKING  5)  Oxygen 2l/m at rest and 3l/m with walking.  6)  follow up Dr. Delton Coombes in 3-4 weeks  7)  Please contact office for sooner follow up if symptoms do not improve or worsen  Prescriptions: IPRATROPIUM BROMIDE 0.03 % SOLN (IPRATROPIUM BROMIDE) 1 neb three times a day  #90 x 5   Entered and Authorized by:   Rubye Oaks NP   Signed by:   Rubye Oaks NP on 02/18/2011   Method used:   Electronically to        [facility] [address] (retail)       [phone]       [place], [place]  [address]       Ph: [phone]       Fax: [phone]   RxID:   [id number]    Immunization History:  Influenza Immunization History:    Influenza:  historical (11/19/2010)  Pneumovax Immunization History:    Pneumovax:  historical (07/20/2010)

## 2011-03-02 NOTE — Progress Notes (Signed)
Summary: nos appt  Phone Note Call from Patient   Caller: [name]  Call For: Jameel Quant Summary of Call: In ref to nos from 3/6, pt has appt on 4/9. Initial call taken by: Darletta Moll,  February 24, 2011 9:45 AM

## 2011-03-04 LAB — URINALYSIS, ROUTINE W REFLEX MICROSCOPIC
Glucose, UA: NEGATIVE mg/dL
Hgb urine dipstick: NEGATIVE
Specific Gravity, Urine: 1.009 (ref 1.005–1.030)
pH: 6.5 (ref 5.0–8.0)

## 2011-03-04 LAB — DIFFERENTIAL
Eosinophils Absolute: 0 10*3/uL (ref 0.0–0.7)
Eosinophils Relative: 0 % (ref 0–5)
Lymphocytes Relative: 13 % (ref 12–46)
Lymphs Abs: 1.4 10*3/uL (ref 0.7–4.0)
Monocytes Relative: 6 % (ref 3–12)
Neutrophils Relative %: 80 % — ABNORMAL HIGH (ref 43–77)

## 2011-03-04 LAB — BASIC METABOLIC PANEL
CO2: 29 mEq/L (ref 19–32)
Chloride: 105 mEq/L (ref 96–112)
GFR calc Af Amer: >60 mL/min (ref >60)
Sodium: 141 mEq/L (ref 135–145)

## 2011-03-04 LAB — RAPID STREP SCREEN (MED CTR MEBANE ONLY): Streptococcus, Group A Screen (Direct): NEGATIVE

## 2011-03-04 LAB — CBC
Hemoglobin: 14.9 g/dL (ref 13.0–17.0)
MCH: 30.3 pg (ref 26.0–34.0)
MCV: 87 fL (ref 78.0–100.0)
RBC: 4.91 MIL/uL (ref 4.22–5.81)
WBC: 10.4 10*3/uL (ref 4.0–10.5)

## 2011-03-05 LAB — CBC
HCT: 35 % — ABNORMAL LOW (ref 39.0–52.0)
HCT: 36.4 % — ABNORMAL LOW (ref 39.0–52.0)
HCT: 37.3 % — ABNORMAL LOW (ref 39.0–52.0)
HCT: 37.8 % — ABNORMAL LOW (ref 39.0–52.0)
HCT: 40.5 % (ref 39.0–52.0)
Hemoglobin: 12 g/dL — ABNORMAL LOW (ref 13.0–17.0)
Hemoglobin: 12.1 g/dL — ABNORMAL LOW (ref 13.0–17.0)
Hemoglobin: 12.6 g/dL — ABNORMAL LOW (ref 13.0–17.0)
Hemoglobin: 13 g/dL (ref 13.0–17.0)
Hemoglobin: 14.2 g/dL (ref 13.0–17.0)
MCH: 28.9 pg (ref 26.0–34.0)
MCH: 30.4 pg (ref 26.0–34.0)
MCH: 30.4 pg (ref 26.0–34.0)
MCH: 30.6 pg (ref 26.0–34.0)
MCHC: 33.1 g/dL (ref 30.0–36.0)
MCHC: 34.1 g/dL (ref 30.0–36.0)
MCHC: 34.4 g/dL (ref 30.0–36.0)
MCHC: 34.5 g/dL (ref 30.0–36.0)
MCV: 87.5 fL (ref 78.0–100.0)
MCV: 87.6 fL (ref 78.0–100.0)
MCV: 88.6 fL (ref 78.0–100.0)
MCV: 88.9 fL (ref 78.0–100.0)
MCV: 89.2 fL (ref 78.0–100.0)
MCV: 89.5 fL (ref 78.0–100.0)
Platelets: 150 10*3/uL (ref 150–400)
Platelets: 159 10*3/uL (ref 150–400)
Platelets: 164 10*3/uL (ref 150–400)
Platelets: 166 10*3/uL (ref 150–400)
RBC: 4.11 MIL/uL — ABNORMAL LOW (ref 4.22–5.81)
RBC: 4.17 MIL/uL — ABNORMAL LOW (ref 4.22–5.81)
RBC: 4.32 MIL/uL (ref 4.22–5.81)
RBC: 4.35 MIL/uL (ref 4.22–5.81)
RDW: 14.6 % (ref 11.5–15.5)
RDW: 14.9 % (ref 11.5–15.5)
RDW: 14.9 % (ref 11.5–15.5)
RDW: 15.1 % (ref 11.5–15.5)
RDW: 15.5 % (ref 11.5–15.5)
WBC: 10.4 10*3/uL (ref 4.0–10.5)
WBC: 7.2 10*3/uL (ref 4.0–10.5)
WBC: 7.2 10*3/uL (ref 4.0–10.5)
WBC: 7.4 10*3/uL (ref 4.0–10.5)
WBC: 9.2 10*3/uL (ref 4.0–10.5)
WBC: 9.3 10*3/uL (ref 4.0–10.5)

## 2011-03-05 LAB — BASIC METABOLIC PANEL
BUN: 15 mg/dL (ref 6–23)
BUN: 15 mg/dL (ref 6–23)
BUN: 6 mg/dL (ref 6–23)
BUN: 8 mg/dL (ref 6–23)
BUN: 9 mg/dL (ref 6–23)
CO2: 25 mEq/L (ref 19–32)
Calcium: 8.5 mg/dL (ref 8.4–10.5)
Calcium: 9 mg/dL (ref 8.4–10.5)
Chloride: 105 mEq/L (ref 96–112)
Chloride: 108 mEq/L (ref 96–112)
Chloride: 109 mEq/L (ref 96–112)
Chloride: 110 mEq/L (ref 96–112)
Creatinine, Ser: 0.71 mg/dL (ref 0.4–1.5)
Creatinine, Ser: 0.88 mg/dL (ref 0.4–1.5)
Creatinine, Ser: 0.95 mg/dL (ref 0.4–1.5)
Creatinine, Ser: 0.99 mg/dL (ref 0.4–1.5)
GFR calc Af Amer: >60 mL/min (ref >60)
GFR calc non Af Amer: >60 mL/min (ref >60)
GFR calc non Af Amer: >60 mL/min (ref >60)
GFR calc non Af Amer: >60 mL/min (ref >60)
GFR calc non Af Amer: >60 mL/min (ref >60)
Glucose, Bld: 135 mg/dL — ABNORMAL HIGH (ref 70–99)
Glucose, Bld: 142 mg/dL — ABNORMAL HIGH (ref 70–99)
Glucose, Bld: 171 mg/dL — ABNORMAL HIGH (ref 70–99)
Glucose, Bld: 183 mg/dL — ABNORMAL HIGH (ref 70–99)
Glucose, Bld: 90 mg/dL (ref 70–99)
Potassium: 3.6 mEq/L (ref 3.5–5.1)
Potassium: 4 mEq/L (ref 3.5–5.1)
Potassium: 4.1 mEq/L (ref 3.5–5.1)
Potassium: 4.2 mEq/L (ref 3.5–5.1)
Potassium: 4.5 mEq/L (ref 3.5–5.1)
Sodium: 143 mEq/L (ref 135–145)
Sodium: 143 mEq/L (ref 135–145)
Sodium: 144 mEq/L (ref 135–145)

## 2011-03-05 LAB — POCT I-STAT, CHEM 8
BUN: 10 mg/dL (ref 6–23)
Calcium, Ion: 1.02 mmol/L — ABNORMAL LOW (ref 1.12–1.32)
HCT: 41 % (ref 39.0–52.0)
Hemoglobin: 13.9 g/dL (ref 13.0–17.0)
Sodium: 141 mEq/L (ref 135–145)
TCO2: 27 mmol/L (ref 0–100)

## 2011-03-05 LAB — CULTURE, BLOOD (ROUTINE X 2): Culture: NO GROWTH

## 2011-03-05 LAB — BLOOD GAS, ARTERIAL
Acid-Base Excess: 0.7 mmol/L (ref 0.0–2.0)
Delivery systems: POSITIVE
Drawn by: 30996
Expiratory PAP: 4
FIO2: 0.45 %
Inspiratory PAP: 12
O2 Saturation: 97.2 %
Patient temperature: 98.6
pO2, Arterial: 95.8 mmHg (ref 80.0–100.0)

## 2011-03-05 LAB — CARDIAC PANEL(CRET KIN+CKTOT+MB+TROPI)
CK, MB: 6.7 ng/mL (ref 0.3–4.0)
Relative Index: 5.6 — ABNORMAL HIGH (ref 0.0–2.5)
Relative Index: 6.6 — ABNORMAL HIGH (ref 0.0–2.5)
Total CK: 101 U/L (ref 7–232)
Total CK: 97 U/L (ref 7–232)
Troponin I: 0.01 ng/mL (ref 0.00–0.06)

## 2011-03-05 LAB — POCT CARDIAC MARKERS
CKMB, poc: 1.7 ng/mL (ref 1.0–8.0)
Myoglobin, poc: 104 ng/mL (ref 12–200)
Troponin i, poc: <0.05 ng/mL (ref 0.00–0.09)

## 2011-03-05 LAB — DIFFERENTIAL
Basophils Absolute: 0 10*3/uL (ref 0.0–0.1)
Basophils Absolute: 0 10*3/uL (ref 0.0–0.1)
Basophils Relative: 1 % (ref 0–1)
Eosinophils Absolute: 0 10*3/uL (ref 0.0–0.7)
Eosinophils Relative: 0 % (ref 0–5)
Eosinophils Relative: 0 % (ref 0–5)
Lymphocytes Relative: 11 % — ABNORMAL LOW (ref 12–46)
Lymphocytes Relative: 7 % — ABNORMAL LOW (ref 12–46)
Lymphs Abs: 1 10*3/uL (ref 0.7–4.0)
Monocytes Absolute: 0.6 10*3/uL (ref 0.1–1.0)
Monocytes Relative: 7 % (ref 3–12)
Neutro Abs: 7.6 10*3/uL (ref 1.7–7.7)

## 2011-03-05 LAB — HEMOGLOBIN A1C
Hgb A1c MFr Bld: 5.3 % (ref <5.7)
Mean Plasma Glucose: 105 mg/dL (ref <117)

## 2011-03-05 LAB — TROPONIN I: Troponin I: <0.01 ng/mL (ref 0.00–0.06)

## 2011-03-05 LAB — LIPID PANEL
Cholesterol: 194 mg/dL (ref 0–200)
HDL: 45 mg/dL (ref >39)
LDL Cholesterol: 128 mg/dL — ABNORMAL HIGH (ref 0–99)

## 2011-03-05 LAB — CK TOTAL AND CKMB (NOT AT ARMC)
Total CK: 194 U/L (ref 7–232)
Total CK: 461 U/L — ABNORMAL HIGH (ref 7–232)

## 2011-03-05 LAB — BRAIN NATRIURETIC PEPTIDE
Pro B Natriuretic peptide (BNP): 37 pg/mL (ref 0.0–100.0)
Pro B Natriuretic peptide (BNP): <30 pg/mL (ref 0.0–100.0)

## 2011-03-05 LAB — PROTIME-INR: INR: 0.91 (ref 0.00–1.49)

## 2011-03-05 LAB — HEPARIN LEVEL (UNFRACTIONATED): Heparin Unfractionated: 0.13 IU/mL — ABNORMAL LOW (ref 0.30–0.70)

## 2011-03-09 NOTE — H&P (Signed)
NAME:  [name], [name] NO.:  [id number]  MEDICAL RECORD NO.:  [id number]           PATIENT TYPE:  E  LOCATION:  [facility]                         FACILITY:  [facility]  PHYSICIAN:  Massie Maroon, MD        DATE OF BIRTH:  1951/09/17  DATE OF ADMISSION:  01/23/2011 DATE OF DISCHARGE:                             HISTORY & PHYSICAL   CHIEF COMPLAINT:  Shortness of breath.  HISTORY OF PRESENT ILLNESS:  A 60 year old male with a history of COPD, on home O2, CAD, status post stent, apparently complains of shortness of breath starting yesterday as he walked to the bathroom.  He had some chest tightness without any radiation and noted some cough, it was nonproductive.  The patient denies any fever, chills, nausea, vomiting, palpitations, diarrhea, bright red blood per rectum, or black stool. The patient's chest x-ray was negative for any infiltrate or congestive heart failure.  The patient was noted be wheezing on exam by the ED physician.  The patient will be admitted for COPD exacerbation as well as evaluation of chest tightness.  PAST MEDICAL HISTORY: 1. COPD, on home O2. 2. CAD, status post PTCA/stent. 3. Hypertension. 4. Hyperlipidemia. 5. Tobacco dependence. 6. GERD.  PAST SURGICAL HISTORY: 1. Appendectomy. 2. Cardiac stents x5 per patient.  SOCIAL HISTORY:  The patient smokes 1-1/2-pack per day for 40 years.  He does not drink.  He formerly worked as an Journalist, newspaper.  He is now on also Social Security disability.  FAMILY HISTORY:  His mother died of old age and Alzheimer's.  His father died at age of 16 from lung cancer and was a smoker.  ALLERGIES:  LIPITOR.  MEDICATIONS:  See med rec.  REVIEW OF SYSTEMS:  Negative for all 10 organ systems except for pertinent positives stated above.  PHYSICAL EXAMINATION:  VITAL SIGNS:  Temperature 98.2, pulse 92, blood pressure 131/75, pulse oximetry is 91% on room air. HEENT:  Anicteric, EOMI, no nystagmus, pupils 1.5  mm, symmetric direct consensual near reflexes intact.  Mucous membranes moist. NECK:  No JVD.  No bruit.  No thyromegaly.  No adenopathy. HEART:  Regular rate and rhythm.  S1 and S2.  No murmurs, gallops, or rubs. LUNGS:  Positive bilateral expiratory wheezes, no crackles. ABDOMEN:  Soft, obese, nontender, nondistended.  Positive bowel sounds. EXTREMITIES:  No cyanosis, clubbing, or edema. SKIN:  No rashes. LYMPH NODES:  No adenopathy. NEURO:  Nonfocal, cranial nerves II through XII intact.  Reflexes 2+, symmetric, diffuse with downgoing toes bilaterally, motor strength 5/5 in all 4 extremities, pinprick intact.  LABORATORY DATA:  January 24, 2011, BUN 13, creatinine 1.1.  WBC 9.6, hemoglobin 14.4, platelet count 160.  ABG shows pH 7.456, pCO2 of 37, pO2 of 51.3 (? venous).  Troponin I less than 0.05.  BNP less than 30. Chest x-ray as stated above, no acute cardiopulmonary abnormalities. EKG shows sinus tach at 103, normal axis, poor R-wave progression, no ST, T segment changes, consistent with acute ischemia.  ASSESSMENT/PLAN: 1. Dyspnea secondary to chronic obstructive pulmonary disease     exacerbation:  Solu-Medrol, Avelox, started on  Spiriva 1 puff     daily, Xopenex nebs 1 p.o. q.8 hours and q.6 hours p.r.n. 2. Tobacco dependence:  The patient was counseled on smoking cessation     for 10 minutes. 3. Chest pain:  The patient will be placed on telemetry.  We will     check cardiac markers x3 sets.  The patient had a cardiac cath on     July 28, 2010 which was stable.  This symptoms actually sound more     related to chronic obstructive pulmonary disease exacerbation and     wheezing and shortness of breath rather than actual cardiac pain,     but please have him follow up with his cardiologist. 4. Gastroesophageal reflux disease:  Protonix. 5. Deep venous thrombosis prophylaxis.  Lovenox.     Massie Maroon, MD     JYK/MEDQ  D:  01/24/2011  T:  01/24/2011  Job:   [id number]  Electronically Signed by Pearson Grippe MD on 03/09/2011 09:39:45 PM

## 2011-03-10 LAB — BASIC METABOLIC PANEL
BUN: 12 mg/dL (ref 6–23)
BUN: 15 mg/dL (ref 6–23)
CO2: 29 mEq/L (ref 19–32)
Calcium: 8.7 mg/dL (ref 8.4–10.5)
Calcium: 8.7 mg/dL (ref 8.4–10.5)
Chloride: 108 mEq/L (ref 96–112)
Creatinine, Ser: 0.95 mg/dL (ref 0.4–1.5)
GFR calc Af Amer: >60 mL/min (ref >60)
GFR calc non Af Amer: >60 mL/min (ref >60)
GFR calc non Af Amer: >60 mL/min (ref >60)
GFR calc non Af Amer: >60 mL/min (ref >60)
Glucose, Bld: 128 mg/dL — ABNORMAL HIGH (ref 70–99)
Glucose, Bld: 137 mg/dL — ABNORMAL HIGH (ref 70–99)
Potassium: 4.1 mEq/L (ref 3.5–5.1)
Sodium: 137 mEq/L (ref 135–145)
Sodium: 140 mEq/L (ref 135–145)
Sodium: 140 mEq/L (ref 135–145)

## 2011-03-10 LAB — CBC
HCT: 38.2 % — ABNORMAL LOW (ref 39.0–52.0)
Hemoglobin: 12.8 g/dL — ABNORMAL LOW (ref 13.0–17.0)
Hemoglobin: 13.2 g/dL (ref 13.0–17.0)
Hemoglobin: 14.1 g/dL (ref 13.0–17.0)
MCV: 87.8 fL (ref 78.0–100.0)
Platelets: 167 10*3/uL (ref 150–400)
Platelets: 169 10*3/uL (ref 150–400)
RBC: 4.67 MIL/uL (ref 4.22–5.81)
RDW: 13.6 % (ref 11.5–15.5)
RDW: 14.1 % (ref 11.5–15.5)
WBC: 7.4 10*3/uL (ref 4.0–10.5)

## 2011-03-10 LAB — CARDIAC PANEL(CRET KIN+CKTOT+MB+TROPI)
CK, MB: 4.7 ng/mL — ABNORMAL HIGH (ref 0.3–4.0)
CK, MB: 5.1 ng/mL — ABNORMAL HIGH (ref 0.3–4.0)
Relative Index: 3 — ABNORMAL HIGH (ref 0.0–2.5)
Total CK: 172 U/L (ref 7–232)
Troponin I: <0.01 ng/mL (ref 0.00–0.06)

## 2011-03-10 LAB — PROTIME-INR: INR: 0.97 (ref 0.00–1.49)

## 2011-03-10 LAB — DIFFERENTIAL
Basophils Absolute: 0 10*3/uL (ref 0.0–0.1)
Lymphocytes Relative: 14 % (ref 12–46)
Monocytes Absolute: 0.4 10*3/uL (ref 0.1–1.0)
Monocytes Relative: 7 % (ref 3–12)
Neutro Abs: 4.1 10*3/uL (ref 1.7–7.7)
Neutrophils Relative %: 72 % (ref 43–77)

## 2011-03-10 LAB — BRAIN NATRIURETIC PEPTIDE: Pro B Natriuretic peptide (BNP): <30 pg/mL (ref 0.0–100.0)

## 2011-03-10 LAB — POCT CARDIAC MARKERS
CKMB, poc: 1.9 ng/mL (ref 1.0–8.0)
Troponin i, poc: <0.05 ng/mL (ref 0.00–0.09)

## 2011-03-10 LAB — URINE DRUGS OF ABUSE SCREEN W ALC, ROUTINE (REF LAB)
Barbiturate Quant, Ur: NEGATIVE
Benzodiazepines.: NEGATIVE
Ethyl Alcohol: <10 mg/dL (ref <10)
Marijuana Metabolite: NEGATIVE
Phencyclidine (PCP): NEGATIVE
Propoxyphene: NEGATIVE

## 2011-03-10 LAB — APTT: aPTT: 29 seconds (ref 24–37)

## 2011-03-10 LAB — CK TOTAL AND CKMB (NOT AT ARMC): Relative Index: 2.6 — ABNORMAL HIGH (ref 0.0–2.5)

## 2011-03-10 LAB — LIPID PANEL
Triglycerides: 102 mg/dL (ref <150)
VLDL: 20 mg/dL (ref 0–40)

## 2011-03-10 LAB — HEPATIC FUNCTION PANEL
ALT: 16 U/L (ref 0–53)
AST: 17 U/L (ref 0–37)
Alkaline Phosphatase: 63 U/L (ref 39–117)
Total Protein: 5.9 g/dL — ABNORMAL LOW (ref 6.0–8.3)

## 2011-03-10 LAB — STREP PNEUMONIAE URINARY ANTIGEN: Strep Pneumo Urinary Antigen: NEGATIVE

## 2011-03-10 LAB — MAGNESIUM: Magnesium: 2.3 mg/dL (ref 1.5–2.5)

## 2011-03-10 LAB — URINALYSIS, ROUTINE W REFLEX MICROSCOPIC
Nitrite: NEGATIVE
Specific Gravity, Urine: 1.022 (ref 1.005–1.030)
Urobilinogen, UA: 0.2 mg/dL (ref 0.0–1.0)
pH: 6 (ref 5.0–8.0)

## 2011-03-10 LAB — LEGIONELLA ANTIGEN, URINE: Legionella Antigen, Urine: NEGATIVE

## 2011-03-10 LAB — PHOSPHORUS: Phosphorus: 3 mg/dL (ref 2.3–4.6)

## 2011-03-10 LAB — GLUCOSE, CAPILLARY: Glucose-Capillary: 140 mg/dL — ABNORMAL HIGH (ref 70–99)

## 2011-03-23 ENCOUNTER — Other Ambulatory Visit: Payer: Self-pay | Admitting: Emergency Medicine

## 2011-03-23 ENCOUNTER — Other Ambulatory Visit: Payer: Self-pay | Admitting: *Deleted

## 2011-03-23 MED ORDER — ALBUTEROL SULFATE (2.5 MG/3ML) 0.083% IN NEBU: INHALATION_SOLUTION | RESPIRATORY_TRACT | Status: DC

## 2011-03-23 NOTE — Telephone Encounter (Signed)
Spoke with pt sister and advised her albuterol neb rx was sent to pharmacy. Advised sister that pt should had refills left on his ipratropium bc on 02/18/11 rx was sent with 5 refills. Sister states she will inform pt of this

## 2011-03-24 ENCOUNTER — Telehealth: Payer: Self-pay | Admitting: Emergency Medicine

## 2011-03-24 ENCOUNTER — Encounter: Payer: Self-pay | Admitting: Emergency Medicine

## 2011-03-24 NOTE — Telephone Encounter (Signed)
Spoke with [facility] and advised her on 02/18/11 the ipratropium was sent w/ 5 refills to [facility] [address]. Monica found the rx and needed nothing else further

## 2011-03-26 LAB — COMPREHENSIVE METABOLIC PANEL
ALT: 12 U/L (ref 0–53)
Alkaline Phosphatase: 60 U/L (ref 39–117)
CO2: 28 mEq/L (ref 19–32)
GFR calc non Af Amer: >60 mL/min (ref >60)
Glucose, Bld: 157 mg/dL — ABNORMAL HIGH (ref 70–99)
Potassium: 3.6 mEq/L (ref 3.5–5.1)
Sodium: 141 mEq/L (ref 135–145)
Total Bilirubin: 0.4 mg/dL (ref 0.3–1.2)

## 2011-03-26 LAB — DIFFERENTIAL
Basophils Absolute: 0 10*3/uL (ref 0.0–0.1)
Basophils Relative: 1 % (ref 0–1)
Eosinophils Absolute: 0.1 10*3/uL (ref 0.0–0.7)
Neutrophils Relative %: 57 % (ref 43–77)

## 2011-03-26 LAB — POCT CARDIAC MARKERS
Myoglobin, poc: 43.1 ng/mL (ref 12–200)
Myoglobin, poc: 58.2 ng/mL (ref 12–200)

## 2011-03-26 LAB — CBC
HCT: 39.8 % (ref 39.0–52.0)
Hemoglobin: 13.8 g/dL (ref 13.0–17.0)
RBC: 4.66 MIL/uL (ref 4.22–5.81)

## 2011-03-29 ENCOUNTER — Encounter: Payer: Self-pay | Admitting: Emergency Medicine

## 2011-03-29 ENCOUNTER — Ambulatory Visit (INDEPENDENT_AMBULATORY_CARE_PROVIDER_SITE_OTHER): Payer: Self-pay | Admitting: Emergency Medicine

## 2011-03-29 VITALS — BP 112/76 | HR 88 | Temp 97.9°F | Ht 71.0 in | Wt 199.0 lb

## 2011-03-29 DIAGNOSIS — J449 Chronic obstructive pulmonary disease, unspecified: Secondary | ICD-10-CM

## 2011-03-29 MED ORDER — ALBUTEROL SULFATE (2.5 MG/3ML) 0.083% IN NEBU: 2.5000 mg | INHALATION_SOLUTION | Freq: Four times a day (QID) | RESPIRATORY_TRACT | Status: DC | PRN

## 2011-03-29 MED ORDER — IPRATROPIUM BROMIDE 0.02 % IN SOLN: 500.0000 ug | Freq: Four times a day (QID) | RESPIRATORY_TRACT | Status: DC

## 2011-03-29 NOTE — Assessment & Plan Note (Signed)
Increase DuoNebs to qid Ventolin prn Pulmicort bid O2 with exertion ROV 3 mon

## 2011-03-29 NOTE — Patient Instructions (Signed)
Increase your albuterol + ipratropium nebulizers to 4x a day Use your Pulmicort nebs 2x a day (as you are doing) Use your oxygen at 2L/min with all exertion Follow up with Dr Delton Coombes in 3 months or as needed We will need to work on stopping your cigarettes. Consider setting a quit date to discuss at our next visit

## 2011-03-29 NOTE — Progress Notes (Signed)
  Subjective:    Patient ID: [name], male    DOB: 1951/12/01, 60 y.o.   MRN: [id number]  HPI 60 yo smoker, hx COPD, admitted 01/2011 for acute exacerbation in setting suspected viral pneumonitis. Current regimen is albuterol/atrovent nebs 2 -3 x a day, budesonide neds bid. He believes that his breathing has declined some since his oral steroids. His breathing is worst with bending over, with exertion. He is limited after    Review of Systems  Constitutional: Positive for unexpected weight change (11 lbs since he left the hospital).  HENT: Positive for congestion, rhinorrhea and sneezing.   Respiratory: Positive for cough (non-prod), shortness of breath and wheezing. Negative for chest tightness.   Cardiovascular: Positive for leg swelling. Negative for chest pain.       Objective:   Physical Exam  Constitutional: He is oriented to person, place, and time. He appears well-developed. No distress.  HENT:  Head: Normocephalic and atraumatic.  Mouth/Throat: No oropharyngeal exudate.  Eyes: Conjunctivae are normal. Pupils are equal, round, and reactive to light.  Neck: Normal range of motion. Neck supple. No tracheal deviation present. No thyromegaly present.  Cardiovascular: Normal rate, regular rhythm and normal heart sounds.  Exam reveals no gallop and no friction rub.   No murmur heard. Pulmonary/Chest: Effort normal and breath sounds normal. No stridor. No respiratory distress. He has no wheezes. He has no rales. He exhibits no tenderness.  Musculoskeletal: Normal range of motion. He exhibits no edema.  Lymphadenopathy:    He has no cervical adenopathy.  Neurological: He is alert and oriented to person, place, and time.  Skin: Skin is warm and dry. He is not diaphoretic. No erythema (ecchymoses on arms).          Assessment & Plan:

## 2011-04-01 LAB — CARDIAC PANEL(CRET KIN+CKTOT+MB+TROPI)
CK, MB: 7 ng/mL — ABNORMAL HIGH (ref 0.3–4.0)
Relative Index: 2.7 — ABNORMAL HIGH (ref 0.0–2.5)
Total CK: 258 U/L — ABNORMAL HIGH (ref 7–232)
Troponin I: <0.01 ng/mL (ref 0.00–0.06)
Troponin I: <0.01 ng/mL (ref 0.00–0.06)

## 2011-04-01 LAB — LIPID PANEL
Cholesterol: 166 mg/dL (ref 0–200)
HDL: 48 mg/dL (ref >39)
LDL Cholesterol: 71 mg/dL (ref 0–99)
Triglycerides: 236 mg/dL — ABNORMAL HIGH (ref <150)

## 2011-04-01 LAB — CBC
HCT: 37.4 % — ABNORMAL LOW (ref 39.0–52.0)
Hemoglobin: 12.6 g/dL — ABNORMAL LOW (ref 13.0–17.0)
Hemoglobin: 14.7 g/dL (ref 13.0–17.0)
MCV: 86 fL (ref 78.0–100.0)
Platelets: 161 10*3/uL (ref 150–400)
Platelets: 161 10*3/uL (ref 150–400)
RBC: 4.39 MIL/uL (ref 4.22–5.81)
RBC: 4.96 MIL/uL (ref 4.22–5.81)
RDW: 14.3 % (ref 11.5–15.5)
WBC: 10.2 10*3/uL (ref 4.0–10.5)
WBC: 6 10*3/uL (ref 4.0–10.5)
WBC: 9.5 10*3/uL (ref 4.0–10.5)

## 2011-04-01 LAB — COMPREHENSIVE METABOLIC PANEL
AST: 19 U/L (ref 0–37)
BUN: 9 mg/dL (ref 6–23)
CO2: 30 mEq/L (ref 19–32)
Calcium: 8.8 mg/dL (ref 8.4–10.5)
Creatinine, Ser: 1.12 mg/dL (ref 0.4–1.5)
GFR calc Af Amer: >60 mL/min (ref >60)
GFR calc non Af Amer: >60 mL/min (ref >60)

## 2011-04-01 LAB — DIFFERENTIAL
Eosinophils Relative: 2 % (ref 0–5)
Lymphocytes Relative: 18 % (ref 12–46)
Lymphs Abs: 1.1 10*3/uL (ref 0.7–4.0)
Monocytes Absolute: 0.4 10*3/uL (ref 0.1–1.0)

## 2011-04-01 LAB — POCT CARDIAC MARKERS
CKMB, poc: 2.2 ng/mL (ref 1.0–8.0)
Myoglobin, poc: 196 ng/mL (ref 12–200)
Troponin i, poc: <0.05 ng/mL (ref 0.00–0.09)

## 2011-04-01 LAB — BASIC METABOLIC PANEL
BUN: 14 mg/dL (ref 6–23)
Calcium: 8.6 mg/dL (ref 8.4–10.5)
Calcium: 8.8 mg/dL (ref 8.4–10.5)
Creatinine, Ser: 0.8 mg/dL (ref 0.4–1.5)
GFR calc Af Amer: >60 mL/min (ref >60)
GFR calc non Af Amer: >60 mL/min (ref >60)
GFR calc non Af Amer: >60 mL/min (ref >60)
Potassium: 4.3 mEq/L (ref 3.5–5.1)
Sodium: 141 mEq/L (ref 135–145)

## 2011-04-01 LAB — URINALYSIS, ROUTINE W REFLEX MICROSCOPIC
Bilirubin Urine: NEGATIVE
Hgb urine dipstick: NEGATIVE
Nitrite: NEGATIVE
Specific Gravity, Urine: 1.017 (ref 1.005–1.030)
pH: 6 (ref 5.0–8.0)

## 2011-04-01 LAB — MAGNESIUM: Magnesium: 2.5 mg/dL (ref 1.5–2.5)

## 2011-04-01 LAB — BRAIN NATRIURETIC PEPTIDE
Pro B Natriuretic peptide (BNP): 80 pg/mL (ref 0.0–100.0)
Pro B Natriuretic peptide (BNP): <30 pg/mL (ref 0.0–100.0)

## 2011-04-06 ENCOUNTER — Telehealth: Payer: Self-pay | Admitting: Cardiology

## 2011-04-09 NOTE — Telephone Encounter (Signed)
Called for refill - per BMS it will go out today

## 2011-04-16 NOTE — Telephone Encounter (Signed)
Sister aware samples are in  Lot  [id number]  Exp 10/2013

## 2011-05-04 NOTE — Discharge Summary (Signed)
NAME:  [name], [name]                ACCOUNT NO.:  [id number]   MEDICAL RECORD NO.:  [id number]          PATIENT TYPE:  INP   LOCATION:  5528                         FACILITY:  [facility]   PHYSICIAN:  Noralyn Pick. Eden Emms, MD, FACCDATE OF BIRTH:  1951-07-19   DATE OF ADMISSION:  11/05/2008  DATE OF DISCHARGE:  11/09/2008                               DISCHARGE SUMMARY   PRIMARY CARDIOLOGIST:  Rollene Rotunda, MD, [facility]   PULMONARY:  Dr. Molli Knock.   DISCHARGING DIAGNOSES:  1. Chronic obstructive pulmonary disease exacerbation/bronchitis in      the setting of dyspnea and ongoing tobacco use.  Pulmonary      consultation this admission with recommendations.  The patient to      follow up with Dr. Molli Knock in 2 weeks outpatient, also have PFTs      done.  2. Chest pain, felt to be atypical for cardiac etiology.  No objective      evidence of ischemia.   PAST MEDICAL HISTORY:  1. Coronary artery disease/inferior MI in 2006, with a stent to the      RCA.      a.     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  Previously had a bare-metal stent to the       posterolateral.      b.     Most recent cardiac catheterization in July 2009, revealing       moderately diffuse and mid LAD stenosis with patent stents to the       circumflex, 50% RCA stent proximal edge, to be treated medically.  2. Hyperlipidemia.  3. Hypertension.  4. Ongoing tobacco use, although the patient states he has not smoked      in 1 year.  5. COPD.  6. Status post appendectomy.   HOSPITAL COURSE:  [name] with past medical history as stated above of  previous tobacco use, presented with flu-like symptoms and chest pain at  rest with increasing dyspnea, left arm and neck pain with cough, fever,  and chills.  The patient was admitted to telemetry, ruled out for  myocardial infarction, evaluated by Pulmonary for COPD exacerbation with  bronchitis.  The patient responded well to  medications being discharged  home to follow up with Dr. Molli Knock outpatient and pulmonary function  studies.  Office will call the patient to arrange this appointment.  Follow up with Dr. Antoine Poche in 4 weeks.  Office will arrange this  appointment.   Medications at the time of discharge include:  1. Aspirin 325 a day.  2. Plavix 75.  3. Altace 2.5.  4. Simvastatin 20 mg daily.  This is a new prescription.  5. Flovent 1 puff b.i.d., prescription provided.  6. Guaifenesin 1200 mg b.i.d., prescription provided.  7. DuoNeb every 6 hours as needed, prescription provided.  8. Doxycycline 100 mg p.o. t.i.d. x3 more days, prescription provided.  9. Tracer study drug continue.  10.Prednisone 40 mg x5 days, prescription provided.   DURATION OF DISCHARGE/ENCOUNTER:  Well over 30  minutes.      Dorian Pod, ACNP      Noralyn Pick. Eden Emms, MD, [facility]  Electronically Signed    MB/MEDQ  D:  11/09/2008  T:  11/09/2008  Job:  [id number]   cc:   Dr. Molli Knock

## 2011-05-04 NOTE — Discharge Summary (Signed)
NAME:  [name], HAQUE NO.:  [id number]   MEDICAL RECORD NO.:  [id number]          PATIENT TYPE:  INP   LOCATION:  6715                         FACILITY:  [facility]   PHYSICIAN:  Isidor Holts, M.D.  DATE OF BIRTH:  [birth year]   DATE OF ADMISSION:  03/11/2009  DATE OF DISCHARGE:  03/14/2009                               DISCHARGE SUMMARY   PRIMARY MEDICAL DOCTOR:  Gentry Fitz.   PRIMARY PULMONOLOGIST:  Felipa Evener, M.D.   PRIMARY CARDIOLOGIST:  Rollene Rotunda, M.D.   DISCHARGE DIAGNOSES:  1. Acute bronchitis.  2. Infective exacerbation of chronic obstructive pulmonary disease      secondary to #1.  3. Coronary artery disease.  4. Hypertension.  5. Dyslipidemia.   PROCEDURES:  Chest x-ray dated March 11, 2009:  This shows  hyperinflation without acute or superimposed abnormality.   DISCHARGE MEDICATIONS:  1. Aspirin 325 mg p.o. daily.  2. Plavix 75 mg daily.  3. Simvastatin 20 mg p.o. every night.  4. Albuterol 2.5 mg/Atrovent 500 mcg bronchodilator nebulizer 1      treatment p.r.n. q.4-6 h. hourly.  5. Home oxygen at 2-4 liters per minute p.r.n.  6. Flovent inhaler (110 mcg) 1 puff b.i.d.  7. Nitroglycerin 0.4 mg 1 sublingually p.r.n. every 5 minutes for      chest pain.  8. Tracer study drug, continue as pre-admission dosage.  9. Mucinex 600 mg p.o. b.i.d. for 7 days only.  10.Avelox 400 mg p.o. daily for 8 days, from March 15, 2009.  11.Prednisone 30 mg p.o. daily for 3 days, then 20  mg p.o. daily for      3 days, then 10 mg p.o. daily for 3 days, then stop.   CONSULTATIONS:  None.   ADMISSION HISTORY:  As in HPI notes of March 12, 2009, dictated by Dr.  Vania [name].  However, in brief, this is a 60 year old male, with  known history of coronary artery disease status post multiple  percutaneous interventions, hypertension, COPD, dyslipidemia, presenting  with a 2-day history of increasing shortness of breath, wheeze and  cough,  productive of clear phlegm.  Patient has home oxygen and  bronchodilator nebulizers, but despite serial nebulizations, he failed  to achieve amelioration and presented to the emergency department. He  was subsequently referred to the medical service for admission for  further evaluation, investigation and management.   CLINICAL COURSE:  1. Acute bronchitis.  For details of presentation, refer to the      admission history above.  The patient's phlegm was initially clear.      However, during hospitalization, phlegm turned yellowish.  Likely,      this was the precipitating cause for his acute exacerbation of      COPD, probably as an initial viral infection but now with      superimposed bacterial infection.  He was commenced on oral Avelox      and is anticipated to complete a total of 10 days antibiotic      treatment.   1. Exacerbation of COPD.  This was secondary to #1 above.  The patient      was managed with bronchodilator nebulizers, oxygen supplementation      and steroid treatment, with satisfactory clinical response.  By      March 12, 2009, he felt considerably better and back to his      baseline.  Fortunately, this gentleman has home oxygen and      bronchodilator nebulizers.  He is recommended to utilize his      nebulizers q.4-6 h. until symptoms have resolved.  He is to      continue to follow up routinely with his primary pulmonologist, Dr.      Molli Knock.   1. Coronary artery disease.  There were no problems referable to this.   1. Dyslipidemia.  The patient continued on pre-admission statin      treatment.  His lipid profile was as follows:  Total cholesterol      166, triglycerides 236, HDL 48, VLDL 71.   1. Hypertension.  BP remained controlled during the course of the      patient's hospitalization.   DISPOSITION:  Patient on March 14, 2009, felt considerably better, was  back to baseline and was keen to be discharged.  He was therefore  discharged  accordingly.   DIET:  Heart healthy.   ACTIVITY:  As tolerated.  Recommended to increase activity slowly.   FOLLOWUP INSTRUCTIONS:  The patient is to follow up with his primary  pulmonologist, Dr. Koren [name], per prior scheduled appointment.  He is  also to follow up with his primary cardiologist, Dr. Angelina [name], per  prior scheduled appointment.      Isidor Holts, M.D.  Electronically Signed     CO/MEDQ  D:  03/14/2009  T:  03/14/2009  Job:  [id number]   cc:   Felipa Evener, MD  Rollene Rotunda, MD, [facility]

## 2011-05-04 NOTE — Discharge Summary (Signed)
NAME:  [name], [name] NO.:  [id number]   MEDICAL RECORD NO.:  [id number]          PATIENT TYPE:  INP   LOCATION:  4706                         FACILITY:  [facility]   PHYSICIAN:  Rollene Rotunda, MD, FACCDATE OF BIRTH:  12/12/1951   DATE OF ADMISSION:  06/19/2008  DATE OF DISCHARGE:  06/20/2008                               DISCHARGE SUMMARY   PRIMARY CARDIOLOGIST:  Rollene Rotunda, MD, [facility]   PRIMARY CARE PHYSICIAN:  He does not have one, but does go to  [facility]   PROCEDURES PERFORMED DURING HOSPITALIZATION:  1. Cardiac catheterization.      a.     Completed by Dr. Tonny Bollman on June 19, 2008, revealing       moderately diffuse mid-LAD stenosis, widely patent circumflex       stent, patent right coronary artery stent with 50% stenosis to       proximal edge, LVEF of 60%.  Continue to treat medically.   FINAL DISCHARGE DIAGNOSES:  1. Angina.  2. Chronic obstructive pulmonary disease.  3. Coronary artery disease.      a.     Inferior myocardial infarction 2006 with Horizon stent to       the right coronary artery.      b.     Posterior myocardial infarction, February 2008, started on       tracer study with Cypher stent x2 to the circumflex in the obtuse       marginal artery.      c.     Chest pain and cardiac catheterization in December 2008 with       bare-metal stent to the PL.  Previously placed stent showed       minimal in-stent restenosis.  4. Hyperlipidemia.  5. Hypertension.  6. Ongoing tobacco abuse.  7. Intolerance to Lipitor.   HOSPITAL COURSE:  This is a 60 year old male patient with known history  of coronary artery disease, stents to the circumflex, to the right  coronary artery, and to the posterolateral, who was admitted secondary  to chest discomfort, tightness, and shortness of breath as well as  diaphoresis.  The patient was seen in the emergency room by Dr. Nona Dell and Theodore Demark, PA.  The patient  was admitted to rule out  myocardial infarction secondary to symptoms of unstable angina.  The  patient was started on nitroglycerin, heparin, and plan for cardiac  catheterization.  The patient did undergo cardiac catheterization per  Dr. Tonny Bollman with results as discussed above.  Please review Dr.  Earmon Phoenix serial cardiac catheterization note for more details.  Postcardiac catheterization, the patient was without complaints.  He was  placed on albuterol and Atrovent inhalers p.r.n.  During  hospitalization, he was also started on Pravachol 40 mg daily.  The  patient was counseled on smoking cessation.  His cardiac enzymes were  found to be negative, and the patient was seen and examined by Dr.  Antoine Poche on day of discharge and found to be stable with followup  appointment scheduled with him in approximately 1 month.  DISCHARGE LABS:  Hemoglobin 12.9, hematocrit 37.8, white blood cells  10.8, and platelets 164.  Sodium 141, potassium 4.3, chloride 107, CO2  27, BUN 18, creatinine 0.88, and glucose 98.  Troponin is negative x3,  total cholesterol 236, triglycerides 102, HDL 54, and LDL 102.   VITAL SIGNS ON DISCHARGE:  Blood pressure 111/68, heart rate 68,  respirations 20, temperature 98.7 with an O2 sat of 95% on 2 L.   DISCHARGE MEDICATIONS:  1. Plavix 75 mg daily.  2. Altace 2.5 mg daily.  3. Pravachol 40  mg daily.  4. Albuterol inhaler 2.5 mg 3 times a day.  5. Atrovent inhaler 0.5 mg 3 times a day.  6. Aspirin 81 mg daily.  7. TRACER study drug.   ALLERGIES:  Intolerance to LIPITOR.   FOLLOWUP PLANS AND APPOINTMENTS:  1. The patient is to follow up with Dr. Rollene Rotunda in July 22, 2008, at 3:05 a.m. which was a previously scheduled appointment.  2. The patient has been advised to find a primary care physician for      continued management with COPD and other medical issues.  The phone      number is [phone] has been given to him to make an appointment  on      his own accord.  3. The patient has been given postcardiac catheterization instructions      with particular emphasis on the right groin site for evidence of      bleeding, hematoma, or signs of infection.   TIME SPENT WITH THE PATIENT TO INCLUDE PHYSICIAN TIME:  35 minutes.      Bettey Mare. Lyman Bishop, NP      Rollene Rotunda, MD, [facility]  Electronically Signed    KML/MEDQ  D:  06/20/2008  T:  06/21/2008  Job:  [id number]

## 2011-05-04 NOTE — H&P (Signed)
NAME:  [name], [name] NO.:  [id number]   MEDICAL RECORD NO.:  [id number]           PATIENT TYPE:   LOCATION:                                 FACILITY:   PHYSICIAN:  Vania Rea, M.D. DATE OF BIRTH:  [birth year]   DATE OF ADMISSION:  03/12/2009  DATE OF DISCHARGE:                              HISTORY & PHYSICAL   PRIMARY CARE PHYSICIAN:  Unassigned.   CARDIOLOGIST:  [facility].   PULMONOLOGIST:  Felipa Evener, M.D.   CHIEF COMPLAINT:  Worsening shortness of breath.   HISTORY OF PRESENT ILLNESS:  This is a 60 year old Caucasian gentleman  with a history of coronary artery disease, hypertension, and COPD who  was last admitted here in November 2009 for COPD exacerbation.  He uses  albuterol and Atrovent by nebulizer as well as is prescribed home oxygen  but has not been using it for at least the past 4-12 months.  He reports  that he was at his baseline until about 2 days ago when he noticed he  developed a cough productive of clear sputum, and then yesterday  developed sudden onset of shortness of breath unrelieved with home  nebulizations.  The patient came to the emergency room tonight, was  found to be wheezing severely, has had serial  nebulizations and has  failed to resolve.  The hospitalist service was called to assist in  management.  Apart from the cough and wheezing, the patient has had a  transient sore throat which he says has now resolved, and he also has  been having episodic fevers.  He denies any frank chest pains but has  been having chest tightness with the wheezing.  He has had no  diaphoresis, no nausea, no vomiting, no dizziness nor syncope.   PAST MEDICAL HISTORY:  1. Severe COPD.  2. Coronary artery disease status post multiple stents, most recent      catheterization in July 2009.  3. Hypertension.  4. Hyperlipidemia.   MEDICATIONS:  1. Aspirin 325 mg daily.  2. Plavix 75 mg daily.  3. Simvastatin 20 mg daily.  4. Albuterol and Atrovent by nebulizer.  5. A tracer study drug from [facility].   ALLERGIES:  No known drug allergies but LIPITOR causes myalgias.   SOCIAL HISTORY:  He has a very heavy tobacco history, used to smoke as  much as 3 packs per day, but now says he only smokes 1-1/2 packs in the  past year.  Denies alcohol or illicit drug use.  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  Lives with  his wife in [place].   FAMILY HISTORY:  Significant for hypertension and coronary artery  disease.   REVIEW OF SYSTEMS:  Other than noted above, a 10-point review of systems  is unremarkable.   PHYSICAL EXAMINATION:  Well-built 60 year old Caucasian gentleman  reclining in the stretcher, somewhat tachypneic but pleasant.  VITALS:  His temperature is 97.0, pulse 89, respirations 20, blood  pressure 131/82, he is saturating at 100% on 4 liters.  Pupils are  round  and equal.  Mucous membranes pink, anicteric.  No cervical  lymphadenopathy or thyromegaly.  No carotid bruits.  CHEST:  He has diffuse rhonchi throughout.  CARDIOVASCULAR SYSTEM:  Regular rhythm, no murmur.  ABDOMEN:  Scaphoid, soft and nontender, there are no masses, he has  normal abdominal bowel sounds.  EXTREMITIES:  Without edema.  He has 2+ bounding pulses bilaterally.  The toes are warm.  CENTRAL NERVOUS SYSTEM:  Cranial nerves II-XII are grossly intact and he  had no focal neurologic deficits.   LABORATORIES:  CBC is reviewed and is completely unremarkable.  Serum  chemistry has not been ordered.  His cardiac enzymes are all completely  normal with undetectable troponins.  His beta-type natriuretic peptide  is undectable.  Two-view chest x-ray reveals hyperinflation without any  acute superimposed abnormality.  His EKG shows sinus rhythm without ST  segment changes.   ASSESSMENT:  1. Acute on chronic respiratory failure.  2. Acute exacerbation of chronic obstructive  pulmonary disease.  3. History of coronary artery disease.  4. History of hypertension, controlled on diet.  5. Enrollment in a clinical study.  6. Serum biochemistry status unknown.   PLAN:  1. Will admit this gentleman to the telemetry unit for serial      nebulizations and steroids for COPD, oxygen as necessary.  2. Will check his serum chemistry and make adjustments to plan      depending on results of those studies.  3. We will continue him on his clinical trial medications.      Vania Rea, M.D.  Electronically Signed     LC/MEDQ  D:  03/12/2009  T:  03/12/2009  Job:  [id number]   cc:   Everardo Beals. Juanda Chance, MD, [facility]  Rollene Rotunda, MD, [facility]  Felipa Evener, MD

## 2011-05-04 NOTE — H&P (Signed)
NAME:  [name], [name] NO.:  [id number]   MEDICAL RECORD NO.:  [id number]          PATIENT TYPE:  INP   LOCATION:  3732                         FACILITY:  [facility]   PHYSICIAN:  Verne Carrow, MDDATE OF BIRTH:  April 03, 1951   DATE OF ADMISSION:  11/05/2008  DATE OF DISCHARGE:                              HISTORY & PHYSICAL   PRIMARY CARDIOLOGIST:  Rollene Rotunda, MD, [facility]   PRIMARY CARE PHYSICIAN:  [facility].   HISTORY OF PRESENT ILLNESS:  This is a 60 year old Caucasian male  patient with known history of CAD, multiple PCI, circumflex in right  coronary artery, history of inferior MI, hypertension, and  hyperlipidemia with complaints of chest pain on and off for the last 2  weeks with some flu-like symptoms, coughing, sneezing, fevers and  chills.  Approximately 1 day ago, the patient had increased dyspnea and  chest tightness and discomfort.  It was hard to breathe and he uses  nebulizer treatments and took an aspirin and it helped.  He also took a  nitroglycerin which helped and placed himself on oxygen.  The patient  has also complained of some left arm achiness.  He states that his  breathing status worsen, so he came to the emergency room.  He also had  one episode of a severe headache on the left side with some left arm  pain, elbow, and hand with some severe coughing.  The patient describes  the symptoms are similar to his prior MI pain.  He has taken Mucinex  causing him to have a productive cough which again has caused him to  have some discomfort when he does so.  His chest pain is not as bad as  his dyspnea, as far as intensity and he said that the pain on his left  arm was worse than it had been in the past.  He also complains of some  sharp stabbing pain as well lasting seconds on the left side.   REVIEW OF SYSTEMS:  Positive for fevers, chills, sweating, chest pain,  dyspnea, cough, wheezing, and mild nausea, otherwise  negative.   PAST MEDICAL HISTORY:  Inferior MI in 2006 with a stent to the right  coronary artery.  He had a posterior MI in February 2008 with a Cypher  stent to the OM and circumflex.  He also has a bare-metal stent to the  posterior lateral, hyperlipidemia, hypertension, tobacco abuse, states  he has not smoked x1 year and COPD.  Most recent cardiac workup, the  patient had a cardiac catheterization in July 2009 revealing moderately  diffuse and mid LAD stenosis, patent stents of the circumflex, 50% RCA  stent proximal edge, be treated medically.   PAST SURGICAL HISTORY:  Appendectomy.   SOCIAL HISTORY:  He lives in [place] with his wife.  He is disabled,  former Curator.  He is a 40-pack-year smoker, negative for EtOH or drug  use.   CURRENT MEDICATIONS AT HOME:  1. Plavix 75 mg daily.  2. Aspirin 325 daily.   TRACE STUDY MEDICATION:  1. Altace 2.5 mg daily.  2.  Pravastatin daily.  3. Metoprolol 25 mg once a day.  4. Albuterol and Atrovent inhalers t.i.d.   ALLERGIES:  The patient is intolerant to LIPITOR causing myalgias.   CURRENT LABS:  Sodium 138, potassium 4.1, chloride 102, CO2 27, BUN 7,  creatinine 0.83, glucose 102.  Hemoglobin 13.6, hematocrit 39.9, white  blood cells 10.1, platelets 226.  BNP 58, CK 142, MB 1.2, troponin less  than 0.05, PT 13.1, INR 1.0, magnesium 2.1.  EKG revealing sinus  tachycardia, ventricular rate of 101 beats per minute.  Chest x-ray  revealing no acute cardiopulmonary process.   PHYSICAL EXAMINATION:  VITAL SIGNS:  Blood pressure 126/79, pulse 96,  respiration is 24, temperature 98.4, and O2 sat 93% at 2 liters.  HEENT:  Head is normocephalic and atraumatic.  Eyes PERRLA.  Mucous  membranes and mouth pink and moist.  Tongue is midline.  NECK:  Supple.  There is no JVD.  No carotid bruits appreciated.  CARDIOVASCULAR:  Regular rate and rhythm without murmurs, rubs, or  gallops.  Pulses are 2+ and equal.  LUNGS:  Expiratory and  inspiratory wheezes noted with some crackles and  productive cough.  ABDOMEN:  Soft, nontender, obese.  2+ bowel sounds.  EXTREMITIES: Without clubbing, cyanosis, or edema.  NEUROLOGIC:  Cranial nerves II through XII are grossly intact.   IMPRESSION:  1. Chest pain with atypical and typical features.  2. Known coronary artery disease with multiple percutaneous coronary      intervention to the circumflex and right coronary artery.  3. Chronic obstructive pulmonary disease.  4. Hypertension.  5. Rule out bronchitis versus chronic obstructive pulmonary disease      exacerbation.   PLAN:  This is a 60 year old Caucasian male with known history of CAD,  COPD with flu-like symptoms and chest pain at rest with increasing  dyspnea and left arm and neck pain with recent cough and fever over the  last 3 weeks with exacerbation of dyspnea prior to admission.  We will  admit the patient to telemetry, rule out for MI, cycle cardiac enzymes.  We will treat for COPD exacerbation with nebulizer treatments and  steroids and consider repeat cath tomorrow if necessary and to be  followed by Dr. Rollene Rotunda.      Bettey Mare. Lyman Bishop, NP      Verne Carrow, MD  Electronically Signed    KML/MEDQ  D:  11/05/2008  T:  11/06/2008  Job:  [id number]   cc:   [facility]

## 2011-05-04 NOTE — Cardiovascular Report (Signed)
NAME:  [name], [name] NO.:  [id number]   MEDICAL RECORD NO.:  [id number]          PATIENT TYPE:  INP   LOCATION:  6526                         FACILITY:  [facility]   PHYSICIAN:  Arturo Morton. Riley Kill, MD, FACCDATE OF BIRTH:  06-Feb-1951   DATE OF PROCEDURE:  12/05/2007  DATE OF DISCHARGE:                            CARDIAC CATHETERIZATION   INDICATIONS:  [name] is a 60 year old gentleman who presents with  recurrent chest pain.  He has had prior acute myocardial infarction on  two separate occasions.  He had an acute occlusion of the proximal right  coronary artery which was stented in the [facility] trial.  He  subsequently had an acute occlusion of the mid circumflex.  He now  presents with some recurrent symptoms which he says are identical to  what he has had in the past.  Therefore, he was seen by Dr. Antoine Poche and  subsequently referred for diagnostic cardiac catheterization.  Unfortunately, the patient has had an interim bleeding episode, but he  has been back on Plavix and aspirin.   PROCEDURE:  1. Left heart catheterization.  2. Selective coronary arteriography.  3. Selective left ventriculography.  4. Intravascular ultrasound of the proximal right coronary artery.  5. Percutaneous stenting of the posterolateral artery.   DESCRIPTION OF PROCEDURE:  The patient was brought to the  catheterization laboratory and prepped and draped in usual fashion.  Through an anterior puncture, the femoral artery was entered.  A 5  French sheath was initially placed.  Views of the left and right  coronary arteries were then obtained in multiple angiographic  projections.  We were particularly concerned on the initial images of a  slight filling defect in the crossover zone of the previously placed  drug-eluting stents in the mid circumflex, so the sheath was upgraded to  a 6 French sheath and a guiding catheter was utilized to get better  opacification.  Following views of the  right coronary artery, central  aortic and left ventricular pressures were measured.  The pigtail  ventriculography was performed in the RAO projection.  At this time,  following the upgraded views,  we were less concerned about the  circumflex artery but I was concerned about the proximal right coronary  artery.  There was also a haziness in the posterolateral artery which  had not been previously noted to the same extent.  As a result, we  elected to recommend an intravascular ultrasound of the proximal vessel  with additional views of the distal vessel.  Bivalirudin was then given  according to protocol, after the patient agreed to proceed.  We then  utilized a Research scientist (physical sciences) which was placed distally.  Intravascular  ultrasound was then performed in carefully analyzed.  This did reveal a  small area of mild stent mal-apposition proximally, although the area is  certainly not extensive.  The area in the proximal portion at the  interface between the stent and the vessel just proximal to it was  carefully measured.  The smallest luminal area appeared to be about 5 mm  squared with a 2.29 x  2.75 lumen.  As a result, we elected to leave this  alone.  We remained concerned about the posterolateral area, whether  this represented a ruptured plaque similar in the way he had presented  previously.  We crossed this area with a wire and clearly this area  looked hazy and somewhat worse.  As a result, we went ahead and direct  stented it, using a 2.25 x 12 Mini Vision stent.  The nondrug-eluting  platform was largely used because of the recent history of some need for  discontinuation of Plavix for a brief period of time due to potential  bleeding and also because of the small size.  The stent was placed  distally across the area of what appeared to be a ruptured plaque.  The  stent was then deployed to about 13-14 atmospheres.  We had initially  planned to post dilate but there was a step-up and  step-down even after  intracoronary nitroglycerin and, therefore, no post dilatation was  performed, as is our usual practice to do so.  Following this, I  reviewed the patient's findings with the patient's wife.  The femoral  sheath was sewn into place and he was transferred to the holding area in  satisfactory clinical condition.  In the holding area, 300 mg of oral  clopidogrel was administered.  The patient remains on chronic  clopidogrel.  He had received chewable aspirin prior to the procedure.   He tolerated procedure without major complications.   HEMODYNAMIC DATA:  1. Central aortic pressure 108/66, mean 85.  2. Left ventricular pressure 113/70.  3. No gradient pullback across aortic valve.   ANGIOGRAPHIC DATA:  1. Ventriculography was done in the RAO projection, revealed preserved      global systolic function.  There was not a definite wall motion      abnormality.  2. The left main is free of critical disease.  3. The LAD has a long area of segmental plaquing of about 30% in its      proximal portion.  At the bifurcation of the diagonal branch, there      is eccentric plaquing that measures about 40-50% luminal reduction.      There is some diffuse luminal irregularity of the LAD itself.  4. There is a ramus intermedius, relatively small in caliber with      about 30% proximal narrowing.  5. The circumflex provides an area with overlapping stents.  At the      junction between the two overlapping stents is a small area of      hypodensity but, after filling this with a guiding catheter and      good opacification, there did not appear the more than about 30-40%      narrowing.  Distal runoff was excellent.  6. The right coronary artery demonstrates an area of about 40%      narrowing just proximal to the previously placed stents.  By      intravascular ultrasound, this measured about 5 mm squared.      Distally, there is an area of segmental plaquing of about 40 to       perhaps 50% before the posterior descending and posterolateral      takeoff.  The large posterolateral branch has an area modestly      distally that is slightly hazy.  It is worrisome for ruptured      plaque.  Following stenting, there was a step-up and step-down so  there is less than 0% stenosis without any evidence of __________      tear.   CONCLUSIONS:  1. Preserved left ventricular function.  2. Prior inferior wall infarction treated with stenting.  3. Prior circumflex artery infarct treated with stenting.  4. Probable unstable angina due to a ruptured plaque in the      posterolateral artery with successful stenting.  5. Continued patency of the overlapping stents in the circumflex and      continued patency of the mid right coronary with some mild stent      mal-apposition by intravascular ultrasound, although this is mild.   DISPOSITION:  The patient will be treated with aspirin and Plavix.  He  remains on chronic Plavix presently.  He absolutely must stop smoking at  this point in time or there will be continued findings such as was found  today.      Arturo Morton. Riley Kill, MD, [facility]  Electronically Signed    TDS/MEDQ  D:  12/05/2007  T:  12/06/2007  Job:  [id number]   cc:   Rollene Rotunda, MD, [facility]  CV Lab  Erskine Speed, M.D.

## 2011-05-04 NOTE — Consult Note (Signed)
NAME:  DUJUAN, STANKOWSKI NO.:  [id number]   MEDICAL RECORD NO.:  [id number]          PATIENT TYPE:  EMS   LOCATION:  MAJO                         FACILITY:  [facility]   PHYSICIAN:  Rollene Rotunda, MD, FACCDATE OF BIRTH:  01-15-1951   DATE OF CONSULTATION:  12/04/2007  DATE OF DISCHARGE:                                 CONSULTATION   PRIMARY PHYSICIAN:  None.   REASON FOR PRESENTATION:  Patient admitted with chest pain.   HISTORY OF PRESENT ILLNESS:  The patient is a 60 year old gentleman with  extensive history of cardiac disease as described below.  His last  hospitalized in February when he had stenting of the circumflex vessel.  He is in the tracer TRACER study.  He was seen in the office one time  after this but has not been back.  He says he takes most of his  medicines though it is not clear exactly which ones.  I do believe that  he takes his aspirin and Plavix.  He gets his Plavix from the company  for free.Marland Kitchen  He has followed up with the TRACER study nurses.  He had  been doing well until Thanksgiving.  He said he had one episode of chest  discomfort but also may have had some upper respiratory problems.  He  remembers taking nitroglycerin around this time.  He may have had  discomfort sporadically since then.  However, today at 6:00 p.m. he  developed 5-6 out of 10 chest discomfort.  It is similar to his previous  angina.  It was a heaviness.  He had some discomfort in his left arm.  There was no radiation to his neck.  There was no associated nausea,  vomiting, diaphoresis.  No palpitations, presyncope or syncope.  He did  have exacerbation of his chronic shortness of breath.  He does have COPD  and uses O2 intermittently.  However, today he had more difficulty  getting his breath, but he has not been describing PND or orthopnea.  He  has not have been having palpitation, presyncope or syncope.   PAST MEDICAL HISTORY:  1. Coronary artery disease (he had  stents to his right coronary artery      in 2006).  Catheterization in February 2008 demonstrated a normal      left main.  The LAD had 30% stenosis, the circumflex had an ostial      with 30% to 40% lesion and was occluded in the mid segment, the      right coronary artery had an ostial of 70% stenosis, the ramus      intermediate had an ostial 70% stenosis, and the right coronary      artery had 40% stenosis.  The patient had a patent stent in the      right coronary artery.  The PDA had 30% stenosis and an EF of 60%.      The patient had two Cypher stents placed in the circumflex.  2. Dyslipidemia, COPD, infrequent been ongoing tobacco use, mild      hypertension, GI bleed with a bloody stool  after his last      catheterization.  (He was seen by Dr. Juanda Chance who thought he might      have had a diverticular bleed.  He was to have followup with her      but never did.  He has had no recent bleeding and denies any bright      red blood, hematemesis or melena.Marland Kitchen)   PAST SURGICAL HISTORY:  Appendectomy.   ALLERGIES:  NONE (BEEN INTOLERANT OF LIPITOR WITH MYALGIAS).   CURRENT MEDICATIONS:  (Again this list is not entirely clear:)  1. Lotensin 5 mg daily.  2. Simvastatin (question dose).  3. Metoprolol (questionable 25 mg daily).  4. Aspirin 325 mg daily.  5. Plavix 75 mg daily.  6. Albuterol.  7. Imdur 30 mg daily.  8. TRACER study drug.  9. Amlodipine (question dose).   SOCIAL HISTORY:  The patient lives at home with his wife and his  daughter.  He has been smoking for many years and continues to smoke a  few cigarettes a day.  He does not drink alcohol.  He is on disability  from leg problems, heart problems and his lungs.   FAMILY HISTORY:  Noncontributory for early coronary artery disease.   REVIEW OF SYSTEMS:  As stated in the HPI.  Negative for fevers, chills.  Negative for all other systems.   PHYSICAL EXAMINATION:  GENERAL:  Patient is in no distress.  VITAL SIGNS:  Blood  pressure 110/77, heart rate 70 and regular,  afebrile, respiratory rate 16.  HEENT:  Eyes are, pupils equal and  reactive, fundi not visualized, oral mucosa normal.  NECK:  No jugular distention, 45 degrees, and Carotid upstroke brisk and  symmetrical.  No bruits, thyromegaly.  LYMPHATICS:  No cervical, axillary or inguinal.  LUNGS:  Clear to auscultation bilaterally.  BACK:  No costovertebral mass.  CHEST:  Unremarkable.  HEART:  PMI not displaced or sustained.  S1 and S2 within normal.  No S3  or S4, clicks, rubs, murmurs.  ABDOMEN:  Flat with positive bowel sounds.  Normal frequency, pitch.  No  bruits, rebound, guarding in the midline, pulsatile mass.  No  hepatosplenomegaly or splenomegaly.  SKIN:  No rashes, no nodules.  EXTREMITIES:  2+ pulses throughout.  No edema, cyanosis or clubbing.  NEURO:  Oriented to place and time.  Cranial nerves II-XII grossly  intact.  Motor grossly intact.   STUDIES:  1. EKG:  Sinus rhythm, rate 70, axis within normal, interval within      normal limits, no acute ST-wave change.  2. Labs:  WBC 9.3, hemoglobin 15.4, platelets 237,000, sodium 139,      potassium 3.9, creatinine 1.1.  3. Chest x-ray:  COPD, emphysema without acute changes and no      infiltrates.   ASSESSMENT/PLAN:  1. Chest discomfort:  The patient's chest comfort is worrisome for      unstable angina.  It is very similar to his previous angina.  Given      this, he will be admitted, we will start him on heparin.  Continue      aspirin and Plavix.  We will continue him on beta blocker and ACE      inhibitor as his blood pressure tolerates.  He will get topical      nitrates or IV if needed.  We will rule out for myocardial      infarction.  He is going to need a cardiac catheterization.  He  understands the risks and benefits and agrees to proceed.  2. Hypertension:  Currently, his blood pressure is not an issue.  I am      actually going to hold just the felodipine.  I will  try to have him      bring his drugs in to see if we can figure out what he has been      taking.  He needs all generic drugs because of finances.  3. Tobacco:  We will educate about the need to quit smoking      completely.  4. Risk reduction:  I believe that he was taking simvastatin.  We will      get a lipid profile and see where he stands.      We will continue generic statin.  5. GI bleed:  He has had no recent GI bleeding.  He is not anemic.  He      is tolerating aspirin and Plavix.  6.  I will encourage his      outpatient followup with Dr. Juanda Chance.  Raquel James, MD, [facility]  Electronically Signed     JH/MEDQ  D:  12/04/2007  T:  12/05/2007  Job:  [id number]

## 2011-05-04 NOTE — Consult Note (Signed)
NAME:  [name], [name] NO.:  [id number]   MEDICAL RECORD NO.:  [id number]          PATIENT TYPE:  INP   LOCATION:  5528                         FACILITY:  [facility]   PHYSICIAN:  Felipa Evener, MD  DATE OF BIRTH:  06/20/1951   DATE OF CONSULTATION:  DATE OF DISCHARGE:                                 CONSULTATION   The patient is a 60 year old male.  Past medical history is significant  for over a 100-pack-year history of smoking who is previously seen in  the [facility] for shortness of breath.  Dr. Sandrea Hughs saw the patient and felt that most of this was upper airway cough  syndrome due to GERD and ordered PFTs.  The patient had his PFTs done  but was unable to attend followup due to financial constraints and came  back to the Cardiology Service on November 06, 2008, with the chief  complaint of chest pain in a patient with previous history of coronary  artery disease.  He was admitted and treated by Cardiology.  Dr. Rollene Rotunda felt that the patient's complaints were noncardiac related and  asked Pulmonary Critical Care to evaluate for the patient to be sent  home with and for extensive pulmonary disease, he undergoes further  intervention and that can be done.  Per the patient, he has been taking  only p.r.n. albuterol and Atrovent as home nebulizer as well as using  oxygen 2 L nasal cannula continuously, but he has been titrating up and  down rather haphazardly.  He reports also dyspnea on exertion and some  night sweats that seem to be intermittent cough productive of white to  off-white sputum but no evidence of purulence at this point.  He is very  unaware of his health condition and is unable to provide much further  history.   PAST MEDICAL HISTORY:  Significant for coronary artery disease with  inferior MI and a stent placed in 2006 to coronary artery as well as  posterior MI in 2008 with another stent to the circumflex as well  as  history of COPD.   PAST SURGICAL HISTORY:  Appendectomy.   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  He smoked since he was 49-  11, quit only 1 year prior to today and smoked anywhere from 2-4 packs  per day.  Negative for EtOH or drug abuse.   CURRENT MEDICATIONS:  Aspirin and Plavix outside the hospital as well as  albuterol and Atrovent inhalers.   ALLERGIES:  To LIPITOR causing myalgias.   REVIEW OF SYSTEMS:  A 12-point review of systems was performed, is  negative other than mentioned in the HPI.   PHYSICAL EXAMINATION:  VITAL SIGNS:  Temperature 98.4, heart rate 72,  respiratory rate 18, blood pressure 128/74, and saturation 93% on nasal  cannula.  GENERAL:  This is an well-appearing male resting comfortably on the exam  bed, in no acute distress, on 2 L nasal cannula.  HEENT:  Normocephalic and atraumatic.  Pupils equal, round, and reactive  to light.  Extraocular movements intact.  Throat and nasal mucosa within  normal limits.  NECK:  No thyromegaly or lymphadenopathy.  No hepatojugular reflux.  HEART:  Regular rate and rhythm.  Normal S1 and S2.  No murmurs, rubs,  or gallops appreciated.  LUNGS:  Diffuse and expiratory wheezes audible in all lung fields and  crackles bibasilar.  ABDOMEN:  Soft, nontender, and nondistended.  Positive bowel sounds.  EXTREMITIES:  No edema.  No tenderness appreciated.  NEUROLOGIC:  Grossly intact.   LABORATORY STUDIES:  Significant for chest x-ray that showed  bronchiectatic changes and with no acute cardiopulmonary process;  however, there was hyperinflation on chest x-ray reviewed myself.  Other  laboratory findings, CBC with a white blood cell count of 7.8,  hemoglobin of 11.7, hematocrit of 33.9, and platelet count of 291.  Cardiac enzymes are negative with BMP done on November 06, 2008, showed  sodium of 140, potassium 4.4, chloride of 104, CO2 of  31, glucose of  157, BUN 7, creatinine 0.78, and calcium of 9.2.   ASSESSMENT AND PLAN:  The patient is a 60 year old male patient with  history significant for chronic obstructive pulmonary disease with more  than 100-pack-year history of smoking, presenting with shortness of  breath.  The patient has very severe wheezes, audible in all lung fields  as well as increased cough consistent with chronic obstructive pulmonary  disease exacerbation.  Therefore, at this point, we recommend the  patient to be put on prednisone 40 mg p.o. daily for 5 days, doxycycline  100 mg p.o. b.i.d. for 5 days as well as continuation of the DuoNebs and  albuterol, continue 2 L of nasal cannula titrated for O2 for a sat of 88-  92.  I will add Flovent 250 mcg 1 puff b.i.d. with  mouth care, the  patient was advised of smoking cessation, Pneumovax if not already done,  and the patient already had PFTs and is to follow up with [facility] with Dr. Sandrea Hughs.  This had been under 2 weeks.      Felipa Evener, MD  Electronically Signed     WJY/MEDQ  D:  11/08/2008  T:  11/09/2008  Job:  [id number]

## 2011-05-04 NOTE — H&P (Signed)
NAME:  [name], [name] NO.:  [id number]   MEDICAL RECORD NO.:  [id number]          PATIENT TYPE:  INP   LOCATION:  4706                         FACILITY:  [facility]   PHYSICIAN:  Jonelle Sidle, MD DATE OF BIRTH:  January 13, 1951   DATE OF ADMISSION:  06/19/2008  DATE OF DISCHARGE:                              HISTORY & PHYSICAL   PRIMARY CARDIOLOGIST:  Rollene Rotunda, MD, [facility]   CHIEF COMPLAINT:  Chest pain.   HISTORY OF PRESENT ILLNESS:  [name] is a 60 year old male with a  history of coronary artery disease.  He was awakened by chest pain and  shortness of breath at 2:00 a.m.  He describes the pain as a tightness  and says it was associated with shortness of breath as well as  diaphoresis but no nausea or vomiting.  It reached to 10/10 at its  worst.  He took two sublingual nitroglycerin at once and shortly after  he put them under his tongue, he felt dizziness.  There was no syncope.  He also took two aspirin and a home nebulizer treatment.  At that time,  his chest pain was still at 5/10, so EMS was called.  They gave him  another nebulizer and in the emergency room he received sublingual  nitroglycerin as well as IV steroid therapy and a third nebulizer.  His  chest pain is currently at 2/10.  Over the last 2 weeks, he has  complained of intermittent pain in his feet and legs.  He also had about  a 30-minute episode 3 days ago of dizziness with blurred vision.  Other  than that, he has been in his usual state of health and this is the  first episode of chest tightness he has had since his cath in December  where he received a bare-metal stent.   PAST MEDICAL HISTORY:  1. Inferior MI in 2006, with Horizon stent to the RCA.  2. Posterior MI in February 2008, started on the tracer study with      Cypher stent x2 to the circumflex/ OM.  3. Chest pain and cardiac catheterization in December 2008, with a      bare-metal stent to the PL, previously placed stents  minimal in-      stent restenosis.  4. Hyperlipidemia.  5. Hypertension.  6. Ongoing tobacco use.  7. Family history of coronary artery disease.  8. COPD.  9. History of bright red blood per rectum after cath in February 2008,      felt diverticula but no followup.   SURGICAL HISTORY:  Status post cardiac catheterizations as well as  appendectomy.   ALLERGIES:  He is intolerant to LIPITOR.   CURRENT MEDICATIONS:  1. Plavix 75 mg a day.  2. Aspirin 325 mg a day.  3. TRACE study medication daily.  4. Altace 2.5 mg daily.  5. Colestipol daily.   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  He has a greater than 40-pack-year history of tobacco use and  states that he smokes one may be  two cigarettes a day now.  He denies  alcohol or drug abuse.   FAMILY HISTORY:  His mother died in her late 35s with a history of  Alzheimer's but no heart disease and his father died of cancer, but he  has coronary artery disease in both an older sister and a younger  brother.   Review of systems is significant for chronic dyspnea on exertion that is  worse when he bends over.  He has occasional palpitations that he states  are both tachy palpitations and also feels that his heart is beating  slowly.  He had PND prior to his last stent but states this has  resolved.  He does have some chronic orthopnea.  He coughs and wheezes  occasionally.  He has some chronic arthralgias and joint pains.  He has  had no GI symptoms in the last 6 months.  Full 14-point review of  systems is otherwise negative.   PHYSICAL EXAM:  VITAL SIGNS: Temperature is 98.0, blood pressure 113/72,  pulse 74, respiratory rate 14, and O2 saturation 90% on room air.  GENERAL:  He is a well-developed, well-nourished white male who looks  older than his stated age.  HEENT:  Normal.  NECK: There is no lymphadenopathy, thyromegaly, bruit, or JVD noted.  CV: His heart is regular rate and rhythm  with heart sounds slightly  distant but no significant murmur, rub, or gallop is noted.  Distal  pulses are 2+ in all four extremities and a right femoral bruit is  appreciated.  LUNGS: He has decreased air flow bilaterally but no wheezes or crackles  are noted.  SKIN: No rashes or lesions are noted.  ABDOMEN: Soft and nontender with active bowel sounds and no  hepatosplenomegaly is noted.  EXTREMITY: There is no cyanosis, clubbing, or edema.  MUSCULOSKELETAL: There is no joint deformity or effusions and no spine  or CVA tenderness.  NEUROLOGIC: He is alert and oriented.  Cranial nerves II-XII grossly  intact.   Chest x-ray showed COPD with no acute disease and cardiac silhouette not  enlarged.   EKG:  Sinus rhythm, rate 66 with no acute ischemic changes.   LABORATORY VALUES:  Hemoglobin 15.3 and hematocrit 46.  Sodium 143,  potassium 4.0, chloride 106, BUN 17, creatinine 1.0, glucose 100, INR  0.7, and PTT 29.  BNP less than 30.  Point of care markers and CK-MB and  troponin are negative x1.   IMPRESSION:  1. [name] was seen, examined, and interviewed by Dr. Diona Browner.  He      presents with symptoms of unstable anginal pain that woke him today      and improved with nitroglycerin.  Initial enzymes are negative and      EKG is unchanged and nonspecific.  He had a bare-metal stent to the      posterolateral in December 2008.  After review of the data, it is      felt that the best option is a cardiac catheterization.  The risks      and benefits of the procedure were discussed with the patient and      he agrees to proceed.  He has been put on the add-on schedule.  We      will add IV nitroglycerin and heparin in the meantime to try and      get him pain free.  2. Hyperlipidemia:  We will try adding Pravachol to his medication      profile as  both tolerance of statins and cost are factors and check      a lipid profile.  3. Chronic obstructive pulmonary disease:  We will  continue on      nebulizers and follow his condition closely.      Chris Demark, PA-C      Jonelle Sidle, MD  Electronically Signed    RB/MEDQ  D:  06/19/2008  T:  06/20/2008  Job:  [id number]

## 2011-05-04 NOTE — Discharge Summary (Signed)
NAME:  [name], [name] NO.:  [id number]   MEDICAL RECORD NO.:  [id number]          PATIENT TYPE:  INP   LOCATION:  6526                         FACILITY:  [facility]   PHYSICIAN:  Rollene Rotunda, MD, FACCDATE OF BIRTH:  06/02/1951   DATE OF ADMISSION:  12/04/2007  DATE OF DISCHARGE:  12/06/2007                         DISCHARGE SUMMARY - REFERRING   DISCHARGE DIAGNOSIS:  1. ACS.  2. Progressive coronary artery disease.  3. Status post bare metal stent to the posterolateral branch with      recommendations for Plavix indefinitely.  4. Continued tobacco use.  5. Hyperlipidemia.  6. History as previously.   PROCEDURES PERFORMED:  Cardiac catheterization December 05, 2007, with  bare metal stenting to the posterolateral branch by Dr. Riley Kill.   BRIEF HISTORY:  [name] is a 60 year old white male who, since  Thanksgiving, had some intermittent chest discomfort.  On the evening of  admission he had an episode that he described as heaviness radiating to  his left arm unassociated with nausea, vomiting or diaphoresis.  There  was an increase in his chronic shortness of breath.  Thus his  presentation.   PAST MEDICAL HISTORY:  1. Notable for COPD with intermittent O2.  2. He also is on multiple medications and is in the TRACER study,      however, it is unclear specifically if he is taking his      medications.  3. He has a history of coronary artery disease with stents to his RCA      in 2006 and two Cypher stents placed in the circumflex in February      2008.  4. He also has a history of dyslipidemia.  5. Hypertension.  6. Ongoing tobacco use.  7. GI bleed with bloody stool post his last catheterization.      Arrangements were to follow up with Dr. Lina Sar post procedure      for possible diverticular bleed.  The patient never followed up.   LABORATORY DATA:  Admission H&H was 15.4 and 45, normal indices,  platelets 237, wbc 9.3.  Prior to discharge H&H was  11.4 and 33.2,  normal indices, platelets 149, wbc 5.8.  PTT 31, PT 11.9.  Admission  sodium 143, potassium 4.6, BUN 8, creatinine 1.12, glucose 88, normal  LFTs.  Prior to discharge BUN and creatinine was 7 and 1, glucose 90,  potassium 4.1.  CK, MBs, relative indexes and troponins were within  normal limits x3.  Fasting lipids showed a total cholesterol 184,  triglycerides 78, HDL 38, LDL 130.  TSH was slightly elevated at 6.027.  At the time of this dictation T3 and T4 are pending.   Chest x-ray on December 04, 2007, showed no acute processes, COPD  findings.   EKG showed normal sinus rhythm, right axis deviation, delayed R-wave,  PAC, nonspecific changes.   HOSPITAL COURSE:  The patient was admitted to [facility],  placed on IV heparin by Dr. Antoine Poche.  Overnight he did not have any  further chest discomfort.  His breathing was stable.  Despite supposedly  being  on maximum simvastatin at home, his LDL was noted not to be at  target.  Apparently the patient could not tolerate Crestor and Lipitor  in the past.  Dr. Antoine Poche was considering adding Niacin.  He had ruled  out myocardial infarction but given his history and continued risk  factors that have not been modified, it was felt that he should undergo  cardiac catheterization.  This was performed on December 05, 2007, by  Dr. Riley Kill.  According to Dr. Rosalyn Charters note, he had a normal ejection  fraction.  There is also a small area of the mid stent malposition at  drug-eluting stent site.  IVUS showed a 40% proximal RCA just prior to  the stent.  He had 30% ramus and proximal LAD 40-50%, mid LAD distal, 40-  50% RCA.  Dr. Riley Kill also thought there possibly was a plaque rupture  in the PDA which was stented with a nondrug-eluting stent.  Overnight he  did not have any further chest discomfort.  Catheterization site was  intact.  Dr. Antoine Poche felt that the patient could be discharged home,  however, prior to discharge,  T3 and T4 were ordered.  He also wanted a  rectal exam performed and this was done and was heme negative.  Cardiac  rehab tobacco cessation consults were also performed prior to discharge.   DISPOSITION:  The patient is discharged home on December 06, 2007, and  advised to maintain low-sodium, heart-healthy diet.  Wound care and  activities are per supplemental discharge sheet.   MEDICATIONS:  He received new prescriptions for all medications as that  we do not know what he is taking at home.  These include  1. Plavix 75 mg daily.  2. Protonix 40 mg daily.  3. Aspirin 325 mg daily.  4. Zocor 80 mg nightly.  5. Toprol XL 25 mg daily.  6. TRACER study drug.  7. Nitroglycerin 0.4 as needed.  He was advised not to take the medications he has at home.   He was told no smoking or tobacco products.  Obtain a primary MD  appointment before he sees Dr. Antoine Poche.  Bring all medicines to all  appointments.  He will see Dr. Antoine Poche on December 25, 2007, at 4:15.  He will follow up with Dr. Lina Sar on January 16, 2008, at 3:00 p.m.   DISCHARGE TIME:  50 minutes.      Joellyn Rued, PA-C      Rollene Rotunda, MD, [facility]  Electronically Signed    EW/MEDQ  D:  12/06/2007  T:  12/06/2007  Job:  [id number]   cc:   Hedwig Morton. Juanda Chance, MD

## 2011-05-07 NOTE — Cardiovascular Report (Signed)
[name], [name] NO.:  [id number]   MEDICAL RECORD NO.:  [id number]          PATIENT TYPE:  INP   LOCATION:  2927                         FACILITY:  [facility]   PHYSICIAN:  Salvadore Farber, MD  DATE OF BIRTH:  [birth year]   DATE OF PROCEDURE:  02/13/2007  DATE OF DISCHARGE:                            CARDIAC CATHETERIZATION   PROCEDURES:  1. Left heart catheterization.  2. Left ventriculography.  3. Coronary angiography.  4. Placement of two overlapping drug-eluting stents in the mid      circumflex extending into the second obtuse marginal branch.   INDICATIONS:  Mr. Binns is a 60 year old gentleman who underwent stenting  of his right coronary artery by Dr. Riley Kill in 2005.  He now presents  with non-ST-elevation myocardial infarction.  He has been treated with  heparin, eptifibatide, aspirin, Plavix,  He is referred for diagnostic  angiography and possible percutaneous coronary intervention.   PROCEDURE TECHNIQUE:  Informed consent was obtained.  Under 1% lidocaine  local anesthesia, a 5-French sheath was placed in the right common  femoral artery using the modified Seldinger technique.  Diagnostic  angiography and ventriculography were performed using JL-4, JR-4, and  pigtail catheters.  These images demonstrated an occlusion of the  circumflex in its mid section.  There were moderate collaterals to the  distal vessel.  Ventriculography suggested that the downstream area  remained viable.  We decided, therefore, to proceed to percutaneous  revascularization of it.   To assess the ability for use of a closure device, we performed  angiography via the sheath.  This demonstrated a severe focal stenosis  within the common femoral artery.  Therefore, we decided not to use a  closure device.   Additional heparin was given to achieve and maintain an ACT of greater  than 200 seconds.  The patient had been initiated on eptifibatide on the  day prior to coming  to the lab.  He had also been preloaded on Plavix.  The sheath was upsized over a wire to 6-French.  A 6-French Voda left  3.5 guide was advanced over wire and engaged in the ostium of the left  main.  The Prowater wire was advanced to the distal circumflex without  difficulty.  The lesion was predilated using a 2.25 x 12 mm Maverick for  two inflations at 6 atmospheres.  I then stented the proximal portion of  the lesion using a 2.5 x 28 mm Cypher deployed at 14 atmospheres.  I  then covered the distal portion of the lesion using an overlapping 2.5 x  13 mm Cypher deployed at 12 atmospheres.  I then postdilated the distal  portion of the stent segment using a 2.5 x 20 mm Quantum at 16  atmospheres.  I then pulled this balloon back to cover just the region  of overlap and inflated it to 18 atmospheres.  Next, I postdilated the  proximal portion of the stented segment using a 3.0 x 20 mm Quantum at  16 atmospheres.  Final angiography demonstrated no residual stenosis, no  dissection, and TIMI-3 flow to the  distal vasculature.  The patient was  then transferred to the holding room in stable condition, having  tolerated the procedure well.   COMPLICATIONS:  None.   FINDINGS:  1. LV:  156/18/24.  EF 60% with focal lateral hypokinesis.  2. No aortic stenosis or mitral regurgitation.  3. Left main:  Angiographically normal.  4. LAD:  Moderate-size vessel giving rise to several very small      diagonals and one moderate-size diagonal.  It has only minor      luminal irregularities.  5. Ramus intermedius:  Moderate-size vessel with an ostial 70%      stenosis.  6. Circumflex:  Moderate-size vessel giving rise to a small first and      larger second marginal.  There is an ostial 30-40% stenosis.  The      vessel was then occluded in its mid section.  The occlusion was      opened using overlapping drug-eluting stents.  There was no      residual stenosis.  7. RCA:  40% stenosis proximally.   There is a previously placed stent      in the mid vessel with no in-stent restenosis.  The PDA has a 30%      ostial stenosis.   IMPRESSION AND RECOMMENDATIONS:  Successful revascularization of the  occluded circumflex.  I plan medical management of the 70% stenosis in  the moderate-size ramus intermedius.  Due to the overlapping drug-  eluting stents in the circumflex as well as a drug-eluting stent in the  right coronary, he should be continued on both aspirin and Plavix  indefinitely.      Salvadore Farber, MD  Electronically Signed     WED/MEDQ  D:  02/13/2007  T:  02/13/2007  Job:  [id number]

## 2011-05-07 NOTE — Assessment & Plan Note (Signed)
[facility]                            CARDIOLOGY OFFICE NOTE   NAME:DUNNKeelin, Sheridan                       MRN:          [id number]  DATE:02/28/2007                            DOB:          05/18/1951    PRIMARY:  None.   REASON FOR PRESENTATION:  Evaluate patient status post myocardial  infarction.   HISTORY OF PRESENT ILLNESS:  The patient was admitted on February 24  with chest pain and non-Q-wave myocardial infarction.  He was found to  have an occluded circumflex vessel.  This was treated with Cypher  stenting x2.  He had a well-preserved ejection fraction.  His course was  complicated by GI bleed.  He was seen by gastroenterologist with plans  for followup as an outpatient.  He was thought to be okay to continue  his Plavix.   Since discharge, he has had none of the chest pain that prompted this  hospitalization.  He has had no neck or arm discomfort.  He has had no  palpitations, presyncope or syncope.  He has had no PND or orthopnea.  He has had a little bit of a knot at his right groin and a little bit  of ecchymosis that is resolving.   PAST MEDICAL HISTORY:  1. Coronary artery disease.  (Left main normal, the LAD 30% serial      stenoses in the proximal segment, circumflex had ostial 30-40%      stenosis and was occluded in the mid segment.  Ramus intermediate      was moderate sized with ostial 70% stenosis.  The right coronary      artery had 40% stenosis proximally.  There was a stent with no in-      stent restenosis.  The PDA had 30% stenosis.  The EF was 60%.)  2. Dyslipidemia.  3. Obstructive pulmonary disease.  4. Previous tobacco use.  5. Previous myocardial infarction (stents to the right coronary artery      in 2006).  6. Borderline hypertension.   PAST SURGICAL HISTORY:  Appendectomy.   ALLERGIES:  None.   CURRENT MEDICATIONS:  1. Aspirin 325 mg daily.  2. Plavix 75 mg daily.  3. Toprol-XL 25 mg daily.  4.  Altace 5 mg daily.  5. Albuterol.  6. Multivitamin.  7. Imdur 30 mg daily.  8. Lipitor 40 mg daily.  9. TRACER study drug.   REVIEW OF SYSTEMS:  As stated in the HPI and otherwise negative for  other systems.   PHYSICAL EXAMINATION:  GENERAL:  The patient is in no distress.  VITAL SIGNS:  Blood pressure 142/88, heart rate 84 and regular, weight  189 pounds, body mass index 26.  NECK:  No jugular venous distention.  Waveform within normal limits.  Carotid upstroke brisk and symmetric.  No bruits, no thyromegaly.  LYMPHATICS:  No lymphadenopathy.  LUNGS:  Clear to auscultation bilaterally.  BACK:  No costovertebral angle tenderness.  CHEST:  Unremarkable.  HEART:  PMI not displaced or sustained.  S1 and S2 within normal limits.  No S3, no S4.  No clicks,  no rubs, or no murmurs.  ABDOMEN:  Mild bruising at a previous appendectomy scar site.  No  rebound or guarding.  No organomegaly.  SKIN:  No rashes, no nodules.  EXTREMITIES:  Show 2+ pulses.  Right groin with slight ecchymosis and a  slight nonpulsatile, nontender mass.  NEUROLOGIC:  Oriented to person, place and time.  Cranial nerves grossly  intact.  Motor grossly intact.   EKG:  Sinus rhythm, rate 84, borderline first-degree AV block, axis  within normal limits, intervals within normal limits, no acute ST-T wave  changes.   ASSESSMENT AND PLAN:  1. Coronary disease.  The patient is having no ongoing symptoms.  No      further cardiovascular testing is suggested.  He will continue with      his medications, and in particular he understands the need to      continue the Plavix without interruption.  2. Gastrointestinal bleeding.  He is due to follow up with Dr. Juanda Chance.      I will defer to her followup labs.  He has had no active bleeding      since discharge.  3. Tobacco.  He has stopped smoking.  I applaud this.  I encouraged      him to continue abstinence.  4. Hypertension.  Blood pressure is borderline.  He is going  to      continue the medications as listed and we will check this going      forward.  5. Dyslipidemia.  Will follow this up when he comes back with a      fasting lipid profile and liver enzymes.  6. Followup.  Will see him back in about 3 months or sooner if needed.     Rollene Rotunda, MD, [facility]  Electronically Signed    JH/MedQ  DD: 02/28/2007  DT: 03/02/2007  Job #: [id number]

## 2011-05-07 NOTE — Cardiovascular Report (Signed)
NAME:  [name], [name] NO.:  [id number]   MEDICAL RECORD NO.:  [id number]          PATIENT TYPE:  OIB   LOCATION:  1963                         FACILITY:  [facility]   PHYSICIAN:  Micheline Chapman, MD   DATE OF BIRTH:  Aug 18, 1951   DATE OF PROCEDURE:  07/28/2006  DATE OF DISCHARGE:                              CARDIAC CATHETERIZATION   INDICATION:  This is a 61 year old man who is part of the Horizon study  after suffering an acute inferior myocardial infarction in 2006.  He  presents today for cardiac catheterization and IVUS of the right coronary  artery stent as part of the followup for the research protocol.   PROCEDURE:  Left heart catheterization, selective coronary angiogram, left  ventricular angiogram, IVUS of the right coronary artery and attempt at  Angio-Seal closure of the right femoral artery.   PROCEDURE DETAILS:  Risks and indications were explained to the patient, who  fully understood.  Informed consent was obtained.  The right groin was  prepped and draped in normal sterile fashion.  Anesthetized with 1%  lidocaine.  Using a modified Seldinger technique, a 6-French arterial sheath  was placed without problems.  Catheters for the procedure included a 6-  French angled pigtail, JL-4, JR-4 and JR-4 sidehole guiding catheter.  All  catheter exchanges were performed over a wire.  At the conclusion of the  case, a 6-French Angio-Seal was deployed for closure.  However, after device  deployment, there was still pulsatile flow at the femoral artery site.  Therefore, manual compression was used for hemostasis.  Because he was  anticoagulated with heparin and had an ACT of approximately 290, we did give  25 mg of protamine to reverse the heparin.  Following manual pressure, there  was good hemostasis at the femoral artery site.  Distal pulses in the  posterior tibial and dorsalis pedis were easily obtained via Doppler, and  also were palpable with a 2+  posterior tibial and 1+ dorsalis pedis.   FINDINGS:  Aortic pressure 148/82 with a mean of 108.  Left ventricular  pressure 144/7 with an end-diastolic pressure of 18.   Left coronary artery:  Left main stem is normal.   LAD:  Courses around to the apex of the heart.  There is 30% serial stenoses  in the proximal to mid segments of the LAD.  The LAD only gives out one  small to medium-sized diagonal.  There is no high-grade disease in the LAD.   Left circumflex:  The proximal left circumflex is normal.  Throughout the  mid segments of the circumflex, there are 60-70% stenoses present.  The  circumflex gives off a low obtuse marginal branch that is free of disease.  The true AV groove circumflex courses into a small vessel.   Right coronary artery:  Proximally, the right coronary artery is normal in  the very proximal segments.  There is a stent in the proximal to mid vessel.  The proximal edge of the stent has a 30% stenosis angiographically.  The  remainder of the stent appears widely patent and  the distal right coronary  artery has mild luminal irregularities.  The right coronary artery is  dominant and courses into a PDA, which does not have any significant  angiographic disease.  There is also a posterior AV segment that gives off  one significant posterolateral branch that has mild luminal irregularities.  The left ventricular angiogram demonstrates normal left ventricular systolic  function with a left ventricular ejection fraction estimated at 55%.  Of  note, the inferior wall has had significant recovery from the time of his  myocardial infarction, in comparison to that left ventriculogram.  The left  ventriculogram was performed in the RAO 30 degree position.   As per the research program, intravascular ultrasound was performed of the  right coronary artery to evaluate the stented segment.  50 units per kilo of  heparin were given.  There was a therapeutic ACT of  approximately 290.  At  that time, we used a floppy guidewire to go beyond the stented segment.  The  intravascular ultrasound probe was advanced in the right coronary artery  beyond the stented segment.  An automated pullback was performed at 0.5  mm/second.  The IVUS demonstrated mild plaque disease in the mid right  coronary artery beyond the stented segment.  The majority of the stented  segment was free of any re-stenosis.  It appeared well opposed and well  expanded.  In the very proximal edge of the stent, there was a 30-40%  stenosis, mostly secondary to neointimal proliferation.  The minimal lumen  diameter in this area was still slightly greater than 2 mm.   IMPRESSION:  1. Moderate left circumflex stenosis.  2. Mild left anterior descending stenosis with no critical lesions      present.  3. Patent right coronary artery stent with mild focal re-stenosis at the      proximal edge of the stent.   PLAN:  We will review the angiographic findings with Dr. Antoine Poche, who is  Mr. Cronkright primary cardiologist.  Will consider medical management versus  percutaneous coronary intervention of the left circumflex, which currently  appears to be his most significant disease.  He does have intermittent chest  pain, but it is unclear if this is related to angina at this point.      Micheline Chapman, MD  Electronically Signed     MDC/MEDQ  D:  07/28/2006  T:  07/28/2006  Job:  [id number]

## 2011-05-07 NOTE — Discharge Summary (Signed)
NAME:  [name], [name] NO.:  [id number]   MEDICAL RECORD NO.:  [id number]          PATIENT TYPE:  INP   LOCATION:  2014                         FACILITY:  [facility]   PHYSICIAN:  Bruno Bing, M.D.  DATE OF BIRTH:  1951-07-13   DATE OF ADMISSION:  07/08/2005  DATE OF DISCHARGE:  07/10/2005                                 DISCHARGE SUMMARY   PRINCIPAL DIAGNOSIS:  Inferior ST segment elevation myocardial infarction.   OTHER DIAGNOSES:  1.  Coronary artery disease.  2.  Chronic obstructive pulmonary disease.  3.  Hyperlipidemia.  4.  Tobacco abuse.  5.  Borderline hyperglycemia.   ALLERGIES:  No known drug allergies.   PROCEDURE:  Left heart cardiac catheterization with PCI and stenting of the  mid right coronary artery with Horizon's study stent.   HISTORY OF PRESENT ILLNESS:  A 60 year old white male without prior cardiac  history who over the past month has been experiencing intermittent chest  discomfort.  On July 20, he woke up at approximately 3 a.m. with 7/10  substernal chest pressure, pain, associated with diaphoresis and presented  to the [facility] Emergency Room at 5 a.m. where he was noted to have 3-mm  ST segment elevation in leads II, III, and aVF with diffuse ST segment  depression.  He was subsequently transferred to [facility] for  further evaluation.   HOSPITAL COURSE:  The patient was taken urgently to the cardiac  catheterization lab on July 20 with catheterization revealing EF of 55%, 40-  50% stenosis in the proximal LAD, 30% lesion in the OM-2, and a total  occlusion of the proximal RCA.  The proximal RCA was stented with 3.0 x 24-  mm and 3.0 x 12-mm Horizon study stent.  He tolerated this procedure well,  and was monitored in the CCU post procedure without any further  complications.  He eventually peaked his CK at 47, his MB at 38.2, and  troponin I of 13.43.  He was transferred out to the floor on July 21, was  seen by  the smoking cessation team.  This morning the patient has been  ambulating without any apparent discomfort.  He is being discharged home  today in satisfactory condition.   DISCHARGE LABS:  Hemoglobin 13.3, hematocrit 38.9, WBC 6.4, platelets 175,  MCV 85.0, sodium 140, potassium 3.7, chloride 107, Co2 27, BUN 8, creatinine  0.9, glucose 122, PT 12.0, INR 0.9, PTT 28, total bilirubin 0.5, alkaline  phosphatase 55, AST 21, ALT 20, albumin 3.8, peak CK 47, peak MB 38.2, peak  troponin I 13.43, total cholesterol 217, triglycerides 186, HDL 32, LDL 148,  calcium 8.7, BNP was less than 30, C-reactive protein 0.7, hemoglobin A1c  5.4.   DISPOSITION:  The patient is being discharged home in good condition.   FOLLOWUP APPOINTMENT:  He is asked to follow up with Dr. Rollene Rotunda in  cardiology clinic in two weeks.  He should also see his primary care  physician Dr. Elmore Guise, M.D. in  one to two weeks.   DISCHARGE MEDICATIONS:  1.  Aspirin 325 mg daily.  2.  Plavix 75 mg daily.  3.  Toprol XL 25 mg daily.  4.  Altace 5 mg daily.  5.  Lipitor 80 mg q.h.s.  6.  Spiriva 18 mcg inhaler daily.  7.  Albuterol inhaler t.i.d.  8.  Nitroglycerin 0.4 mg p.r.n. chest pain.   After reviewing lab studies and discharge encounter 45 minutes.       CRB/MEDQ  D:  07/10/2005  T:  07/11/2005  Job:  [id number]   cc:   Rollene Rotunda, M.D.  [address]  [address]  [place]  [place] [address]   Erskine Speed, M.D.  [address] [place]  [place]  [place] [address]  Fax: [phone]

## 2011-05-07 NOTE — Assessment & Plan Note (Signed)
[facility]                              CARDIOLOGY OFFICE NOTE   NAME:[name], [name]                       MRN:          [id number]  DATE:10/18/2006                            DOB:          1951/03/10    PRIMARY CARE PHYSICIAN:  [facility].   REASON FOR PRESENTATION:  Evaluate patient with chest discomfort and  coronary disease.   HISTORY OF PRESENT ILLNESS:  The patient returns for followup.  He is 60  years old.  He has coronary disease as described below.  Since being started  on Imdur, he says he gets much less of the atypical pain that he was having  before.  He has not been getting any chest pressure, neck or arm discomfort.  He has not been getting palpitations, presyncope, or syncope.  His continued  complaint is dyspnea with any exertion.  I do note that he had pulmonary  function tests with severe obstructive lung defect.  Dr. Sherene Sires questioned  taking him off ACE inhibitor.   PAST MEDICAL HISTORY:  1. Coronary artery disease (catheterization in 2007 with normal left main.      The LAD had a long 30% stenosis.  There was 60-70% stenosis in the      large circumflex marginal branch.  He had 2 patent stents in the right      coronary artery.  Had a myocardial infarction in 2006 with stenting.)  2. COPD.  3. Appendectomy.  4. Dyslipidemia.   ALLERGIES:  None.   CURRENT MEDICATIONS:  1. Aspirin 325 mg a day.  2. Plavix 75 mg a day.  3. Toprol XL 25 mg a day.  4. Altace 5 mg a day.  5. Lipitor 80 mg nightly.  6. Albuterol.  7. Multivitamins.  8  Imdur 30 mg daily.  1. Protonix 40 mg daily.  2. Recent prescription for antibiotics.   REVIEW OF SYSTEMS:  As stated in the HPI, otherwise negative for other  systems.   PHYSICAL EXAMINATION:  GENERAL: The patient is in no acute distress.  VITAL SIGNS: Blood pressure 131/98, heart rate 81 and regular.  Weight 192  pounds.  Body mass index 26.  HEENT:  Eyelids  unremarkable.  Pupils equal, round, and reactive to light.  Fundi not visualized. Oral mucosa unremarkable.  NECK:  No jugular venous distention, wave form within normal limits.  Carotid upstroke brisk and symmetric. No bruits, no thyromegaly.  LYMPHATICS:  No cervical, axillary, inguinal adenopathy.  LUNGS: Clear to auscultation bilaterally.  BACK: No costovertebral angle tenderness.  CHEST:  Unremarkable.  HEART:  PMI not displaced or sustained.  S1 and S2 within normal limits.  No  S3, no S4, no murmurs.  ABDOMEN: Flat, positive bowel sounds, normal in frequency and pitch, no  bruits, rebound, guarding, midline pulsatile mass, hepatomegaly,  splenomegaly.  SKIN: No rashes, no nodules.  EXTREMITIES:  2+ pulses throughout.  No edema, clubbing, or cyanosis.  NEUROLOGIC:  Oriented to person, place, and time . Cranial nerves II-XII  grossly intact.  Motor grossly intact.   ASSESSMENT AND PLAN:  1. Coronary disease: The patient was having no symptoms because there is      no angina.  No further cardiovascular testing is suggested.  Will      continue with secondary risk reduction.  2. Hypertension:  I do think it is fine to take him off Altace and see if      his pulmonary problems get better.  He will need something for his      blood pressure, and so I will start Norvasc 5 mg daily.  3. Chronic obstructive pulmonary disease: Per Dr. Thurston Hole previous note, I      am going to set him up with a followup with Dr. Sherene Sires.  The patient      knows to stop smoking completely.  He says his is now only smoking 1 or      2 cigarettes a month.  4. Dyslipidemia:  He is remaining on the Lipitor.  He understands he needs      to get a lipid profile and liver enzymes about twice a year.  This can      be done by his primary care doctor.  The goal would be an LDL of less      than 100 and HDL in the 40s.  5. Followup: I will see him back in one year or sooner if needed.     ______________________________  Rollene Rotunda, MD, [facility]    JH/MedQ  DD: 10/18/2006  DT: 10/18/2006  Job #: [id number]   cc:   [facility]  Casimiro Needle B. Sherene Sires, MD, FCCP

## 2011-05-07 NOTE — Cardiovascular Report (Signed)
NAME:  [name], [name] NO.:  [id number]   MEDICAL RECORD NO.:  [id number]          PATIENT TYPE:  INP   LOCATION:  2929                         FACILITY:  [facility]   PHYSICIAN:  Arturo Morton. Riley Kill, M.D. [facility] OF BIRTH:  18-Jul-1951   DATE OF PROCEDURE:  07/08/2005  DATE OF DISCHARGE:                              CARDIAC CATHETERIZATION   INDICATIONS:  [name] is a 60 year old gentleman who presents with an acute  inferior wall infarction at [facility]. He was seen by Dr.  Antoine Poche in the catheterization laboratory and referred for urgent cardiac  catheterization and reperfusion therapy. He discussed the risks, benefits,  alternatives. He was also seen by Florinda Marker, and when I spoke with the  patient, he was agreeable to enrollment in the Horizons trial.   PROCEDURES:  1.  Left heart catheterization.  2.  Selective coronary arteriography.  3.  Selective left ventriculography.  4.  PTCA and stenting of the right coronary artery with overlapping stents.  5.  Intravascular ultrasound.   DESCRIPTION OF PROCEDURE:  The patient was brought to the catheterization  laboratory and prepped and draped in the usual fashion. Through an anterior  puncture, the left femoral artery was entered. The right femoral artery  demonstrated diminished pulses and the patient has symptoms suggestive of  claudication. We were able to get a wire up easily into the central aorta  from the left. A 6-French sheath was then placed. Views of the left and  right coronary artery were then obtained in multiple angiographic  projections. Central aortic and left ventricular pressures were measured  with a pigtail. Ventriculography was performed in the RAO projection. We  then did a small contrast distal injection to assess the right iliac. The  pigtail was then removed. The patient was randomized to unfractionated  heparin and glycoprotein inhibition, and appropriate doses of these  were  administered and ACT's checked. A JR-4 guiding catheter with side holes was  then utilized. A 6-French [employer] scientific high-torque floppy wire was then  taken down across the lesion. We then placed a 2.25 x 15 Maverick balloon.  With slow reperfusion, intravenous atropine was administered to interfere  with basal generation reflex. This was successful and we were able to keep  the pressure up. Nitroglycerin had been discontinued. The artery was then  opened with the balloon with re-establishment of TIMI III flow. We initially  sized the stent and thought that the lesion was about 18 mm in length. With  this, a 3 x 24 Horizon study stent was then placed across the lesion and  taken up to 15 atmospheres. We post dilated with a 3.75-mm balloon. At the  distal end of the stent was some continued slight haziness. Intravascular  ultrasound was performed as an appropriate procedure and this did  demonstrate that the distal extent of the infarct dissection extended beyond  the placement of the distal stent so a second 12 x 30 overlapping stent was  placed distally and carefully overlapped into the proximal vessel. We also  measured using ultrasound and felt that 12  mm would be plenty of residual  stent. This was opened up, and then the area overlapping this was post  dilated with a 3.75-mm balloon. Follow-up ultrasound procedure was then  performed. Final angiographic views were obtained. There was marked  improvement in pain, ST resolution, and the patient was taken to the holding  area in satisfactory clinical condition.   HEMODYNAMIC DATA.:  1.  Central aortic pressure 104/76, mean 89.  2.  Left ventricular pressure 109/19.  3.  No gradient or pullback across the aortic valve.   ANGIOGRAPHIC DATA:  1.  Ventriculography was performed in the RAO projection. The inferobasal      area was akinetic. Ejection fraction was calculated 55%.  2.  The left main was free of critical disease.   3.  The LAD has some 40-50% up to perhaps 60% one-view of narrowing in the      mid portion of the left anterior descending artery. It does not appear      to be critical. There is flow down the distal LAD with good moderate      size diagonal branch.  4.  There is a ramus intermedius that is relatively small.  5.  There is segmental diffuse disease of approximately 30% in tandem      fashion in the circumflex marginal system.  6.  The right coronary artery is a large dominant vessel. It is totally      occluded in the proximal portion near the junction of the proximal and      mid vessel. Following reperfusion, there is evidence of a ruptured      plaque. Across this area is laid a 24 x 3.0 Horizon study stent which is      post-dilated to 3.75. Overlapping this distally is a 12 x 3.0 stent.      This is post-dilated also to 3.75. The distal vessel terminates as a      posterior descending and posterolateral branch. It is a large-caliber      vessel. The posterolateral branch has about a 40-50% area of plaquing in      the posterolateral segment. There is what appears to be blush grade 2      arising from the PDA.   CONCLUSIONS:  1.  Acute inferior wall myocardial infarction with mild reduction in overall      left ventricular function.  2.  Total occlusion of a large proximal right coronary artery with large      distal vessel successfully stented using a Horizon study stent.  3.  Scattered lesions of the right coronary artery.   DISPOSITION:  1.  The patient needs to stop smoking.  2.  Lipid-lowering drugs will be used, as well as aspirin and Plavix, for a      minimum of 1 year  3.  The patient will need close follow-up in the cardiology clinic.   ADDENDUM:  Intravascular ultrasound was performed after the first stent  placement. This revealed a well-deployed stent with luminal dimensions of  about 3.2 x 3.2. Importantly, there was evidence of some dissection in clot distal to  the stent site suggesting that the acute infarct extended beyond  this. Intravascular ultrasound was then  repeated following the second overlapping stent. The stent appeared to be  well-deployed. Reference vessel diameter distally was about 3.5 x 3.5 and  the stent appeared to be well-deployed throughout with no evidence of edge  tear at either the distal or the  proximal end.       TDS/MEDQ  D:  07/08/2005  T:  07/08/2005  Job:  [id number]   cc:   Erskine Speed, M.D.  [address], [address]  [place]  [place] [address]  Fax: [phone]   Rollene Rotunda, M.D.  [address]  [address]  [place]  [place] [address]

## 2011-05-07 NOTE — H&P (Signed)
NAME:  [name], HEMMER NO.:  [id number]   MEDICAL RECORD NO.:  [id number]          PATIENT TYPE:  INP   LOCATION:                               FACILITY:  [facility]   PHYSICIAN:  Rollene Rotunda, M.D.   DATE OF BIRTH:  10/15/1951   DATE OF ADMISSION:  07/08/2005  DATE OF DISCHARGE:                                HISTORY & PHYSICAL   REASON FOR PRESENTATION:  Chest pain.   HISTORY OF PRESENT ILLNESS:  The patient is a 60 year old gentleman without  prior cardiac history.  In retrospect he has been having some right arm  discomfort for the past few days.  He has been having some chest discomfort  stuttering over the past month or so.  He thought it was indigestion.  This  morning he was asleep and at 3 a.m. awoke with substernal chest discomfort.  It was 7/10 at its worse.  It was associated with diaphoresis, though no  nausea and vomiting.  There was no radiation to his jaw or to his arms  today.  He presented to [facility] Emergency Room around 5 a.m. where he  was noted to have inferior ST segment elevation of 2-3 mm in leads 2, 3, aVF  with diffuse ST depression elsewhere.  He was managed with heparin, Plavix,  Lopressor, morphine, and IV nitroglycerin.  His pain is down to 4/10 and he  was taken urgently to the catheterization laboratory.   Otherwise, the patient is without cardiac complaints.  He does have COPD and  ongoing tobacco abuse.  There is no PND or orthopnea.  No palpitations.  No  presyncope or syncope.   PAST MEDICAL HISTORY:  1.  COPD.  2.  Osteomyelitis.   PAST SURGICAL HISTORY:  Appendectomy.   ALLERGIES:  None.   MEDICATIONS:  1.  Spiriva.  2.  Albuterol.   SOCIAL HISTORY:  Patient is married.  He has been smoking 40 years and  currently smoking one pack per day.   FAMILY HISTORY:  Noncontributory for early coronary artery disease.   REVIEW OF SYSTEMS:  Positive for possible claudication.  Negative for other  systems.   PHYSICAL  EXAMINATION:  GENERAL:  The patient is mildly uncomfortable-  appearing.  Affect appropriate.  VITAL SIGNS:  Blood pressure 125/78, heart rate 74 and regular, 94%  saturation on 2 L.  HEENT:  Eyelids unremarkable.  Pupils are equal, round, and reactive to  light.  Fundi not visualized.  Oral mucosa not visualized.  NECK:  Patient is lying flat.  Carotid upstroke brisk and symmetric.  No  bruits.  No obvious thyromegaly.  LYMPHATICS:  No cervical, axillary, or inguinal adenopathy.  LUNGS:  Clear to auscultation anteriorly without dullness to percussion.  CHEST:  Unremarkable.  HEART:  PMI not displaced or sustained.  S1 and S2 within normal limits.  No  S3, no S4.  No murmurs.  ABDOMEN:  Flat.  Positive bowel sounds.  Normal in frequency and pitch.  No  bruits, rebound, guarding, midline pulsatile mass.  No hepatomegaly,  splenomegaly.  SKIN:  No  rashes.  No nodules.  EXTREMITIES:  2+ pulses throughout.  No edema, cyanosis, clubbing.  NEUROLOGIC:  Oriented to person, place, and time.  Cranial nerves II-XII  grossly intact.  Motor grossly intact.   EKG:  Sinus rhythm, rate 77, axis within normal limits.  Intervals within  normal limits.  Inferior ST segment elevation 2, 3, and aVF.  T-wave  inversions, ST segment depression 1, aVL, V1-V5.   LABORATORIES:  WBC 6.8, hemoglobin 14.8, platelets 181.  INR 0.9.  Sodium  139, potassium 3.5, BUN 12, creatinine 1.1.   ASSESSMENT/PLAN:  1.  Acute inferior infarct.  Patient is taken urgently to the cardiac      catheterization laboratory per Dr. Riley Kill.  2.  Smoking.  The patient will have a stop smoking consult.  3.  Risk reduction.  Patient will get a lipid profile and will be started on      a Statin.  4.  Chronic obstructive pulmonary disease.  He will be continued on his      Spiriva and albuterol inhaler.  5.  Elevated fasting glucose.  I am not sure when the patient ate last.      Will follow this, probably check a hemoglobin A1C and  treat as needed.           ______________________________  Rollene Rotunda, M.D.     JH/MEDQ  D:  07/08/2005  T:  07/08/2005  Job:  [id number]   cc:   Erskine Speed, M.D.  [address], [address]  [place]  [place] [address]  Fax: [phone]

## 2011-05-07 NOTE — Discharge Summary (Signed)
NAME:  [name], [name]                          ACCOUNT NO.:  [id number]   MEDICAL RECORD NO.:  [id number]                   PATIENT TYPE:  INP   LOCATION:  5736                                 FACILITY:  [facility]   PHYSICIAN:  Melissa L. Ladona Ridgel, MD               DATE OF BIRTH:  Jan 22, 1951   DATE OF ADMISSION:  03/30/2004  DATE OF DISCHARGE:  04/03/2004                                 DISCHARGE SUMMARY   ADMISSION DIAGNOSES:  Shortness of breath.   DISCHARGE DIAGNOSES:  1. Chronic obstructive pulmonary disease exacerbated by right middle lobe     pneumonia.  2. Hyperglycemia secondary to steroid use.  3. Tobacco abuse.   DISCHARGE MEDICATIONS:  1. Humibid LA 1200 mg p.o. b.i.d.  2. Albuterol 2.5 mg nebulizer q.4-6h.  3. Atrovent 0.5 mg nebulizer q.4-6h.  4. Pulmicort 0.5 mg nebulizer q.12h.  5. Protonix 40 mg p.o. daily.  6. Moxifloxacin 400 mg once daily x13 days.  7. Prednisone 60 mg tapered over 20 days.  8. Glucotrol 2.5 mg p.o. daily while on prednisone.  9. Nicoderm patches 21 mg daily for one week, then step down to the 14 mg     patch.   DIET:  The patient was instructed on not eating any concentrated sweets  while on the prednisone.   SPECIAL INSTRUCTIONS:  The patient is to use 1-2 L of oxygen with activity  and at rest. He is instructed not to smoke while taking the oxygen as this  is a fire hazard. He was also instructed not to go without as it could be a  risk to his health at this time.   DISCHARGE FOLLOW UP:  The patient has been scheduled to see Dr. Arvilla Market on  April 19 at11:30 a.m. He has expressed understanding at making this  appointment.   HISTORY OF PRESENT ILLNESS:  The patient is a 60 year old Caucasian male who  presented to the emergency room with severe shortness of breath. He gave no  past medical history for COPD, asthma, or CHF. He denied any orthopnea. He  did state that he did have some yellow sputum with fever and lethargy. There  was a  period of time on the day of admission where he may have had a  syncopal episode. He denied any chest pain, palpitations, leg, or ankle  pain. Denied any sore throat or vomiting. In the emergency room the patient  was found to be in severe respiratory distress. He was febrile and wheezing.  He was started on Ventolin nebulizers and given IV Zithromax and Rocephin.  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 He notes that he has had  problems in the past with fumes and exposure at work. He does smoke a pack  a  day of cigarettes for the last 50 years and occasionally utilized alcohol.  In the emergency room the patient was found to have a saturation of 88% on  room air, temperature 103, and chest x-ray revealed COPD changes with a  right middle lobe infiltrate. His EKG was consistent with sinus tachycardia  and a left axis deviation with interventricular conduction delay, but no  acute ST-T wave changes. Blood cultures drawn in the emergency room have  remained negative. His initial white count was 10.3 and remained stable  during the course of the hospitalization. His ABG at the time of admission  revealed a pCO2 of 40.6, pO2 45, pH 7.42, and bicarb of 26.3 with an oxygen  saturation of 81%. Cardiac enzymes were evaluated and were found to be  negative.   HOSPITAL COURSE:  He was admitted to telemetry and treated for a community  acquired right middle lobe pneumonia and chronic obstructive pulmonary  disease. During the course of his hospitalization the nebulizer treatments  were continued at q.4h. with good results, decreasing his bronchospasm. He  remained mildly dyspneic without his oxygen and desaturated down to the 85-  86% range without O2. With oxygen his saturations remained 91-92% and he was  not dyspneic. He denied any  dizziness during these periods of dyspnea off of  oxygen and no headaches. Over the course of the hospitalization with  aggressive nebulizer treatments, Humibid LA, and a flutter valve the patient  was able to expectorate some mucous. On the day of discharge the patient's  vital signs were stable with a temperature of 97.8, blood pressure 144/90,  pulse 88, respirations of 20. His O2 saturations on 2 L were 91-95%. Off of  oxygen dipped as low as 83%. The computer records a saturation of 78% on 2  L, but I believe that this is in error. After being started on prednisone  the patient's blood glucose levels were noted to range from the 150s to  260s. We therefore started him on a low dose of Glucotrol while on  prednisone.   PHYSICAL EXAMINATION:  HEENT:  On the day of discharge the patient is  normocephalic, atraumatic with pupils equal, round, and reactive to light.  He is anicteric. No pallor. Mucous membranes are moist.  NECK:  No JVD, No lymph nodes.  CHEST:  Mild end expiratory wheezing with decreased breath sounds in both  bases. Distant breath sounds are noted. He has a ronchorous cough, but no  rhonchi on auscultation.  CARDIOVASCULAR:  Regular rate and rhythm. Positive S1, S2. No S3, S4. No  murmur, rub or gallop  ABDOMEN:  Soft, nontender, nondistended with positive bowel sounds.  EXTREMITIES:  2+ pulses with no edema.  NEUROLOGIC:  He is intact. 5/5. DTR is 2+. Toes are downgoing.   LABORATORY DATA:  Hemoglobin  A1C of 5.3 showing that the hyperglycemia is  related to steroid use. His last BUN and creatinine were 14 and 0.9 on April  13. His last H&H, also on the 13th, are 12.9 and 37.7. Blood cultures  remained negative. His urine was unremarkable. He had serial enzymes, which  would amount for MI.   His brain natriuretic peptide was noted to be less than 30.   In summary, this is a 60 year old gentleman who has not been keeping up with his health who presents with COPD  exacerbation and right middle lobe  pneumonia.  He shows signs of serious lung damage.  With saturations in the  80s I suspect that his baseline has not been far from this, probably in the  high 80s to low 90s. He has probably been suffering symptoms for quite some  time, but is beginning to worry now. I would recommend that the patient  continue his antibiotics and taper slowly. His cough medicine is Humibid and  while on steroids Protonix for his stomach. He should remain on Glucotrol  while on his steroids and this can be discontinued when his steroids are  discontinued, since at baseline it appears that he does not have diabetes.   CURRENT MEDICATIONS:  Albuterol and Atrovent nebulizers. I have added  Pulmicort because of the continued wheezing.   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 The  patient has been counseled on smoking cessation and wishes to continue with  Nicoderm patches as a outpatient. At the time of discharge the patient is  stable with follow-up to Dr. Neomia Dear in the following weeks. He has been  given my phone number if he has questions over the weekend with his  treatment.                                                Melissa L. Ladona Ridgel, MD    MLT/MEDQ  D:  04/03/2004  T:  04/03/2004  Job:  [id number]   cc:   Donia Guiles, M.D.  [address] [place]  [place] [address]  Fax: [phone]

## 2011-05-07 NOTE — Discharge Summary (Signed)
NAMEMEARLE, DREW NO.:  [id number]   MEDICAL RECORD NO.:  [id number]          PATIENT TYPE:  INP   LOCATION:  2927                         FACILITY:  [facility]   PHYSICIAN:  Rollene Rotunda, MD, FACCDATE OF BIRTH:  02/03/1951   DATE OF ADMISSION:  02/12/2007  DATE OF DISCHARGE:  02/15/2007                               DISCHARGE SUMMARY   PROCEDURES:  1. Cardiac catheterization.  2. Coronary arteriogram.  3. Left ventriculogram.  4. PTCA and Cypher stent x2 to the circumflex into the OM.   TIME AT DISCHARGE:  Forty-two minutes.   PRIMARY DIAGNOSES:  Acute posterior myocardial infarction.   SECONDARY DIAGNOSES:  1. Residual coronary artery disease in the ramus intermedius at 70%      and in the right coronary artery at 40%.  2. Preserved left ventricular function with an ejection fraction of      60% at catheterization.  3. Hyperlipidemia with a total cholesterol of 182, triglycerides 73,      HDL 45, LDL 122, statin added this admission  4. Status post appendectomy.  5. Chronic obstructive pulmonary disease.  6. Status post myocardial infarction in 2006 with stents to the right      coronary artery (no instent restenosis this admission).  7. A history of noncompliance secondary to financial issues.  8. Ongoing tobacco use.  9. Borderline hypertension.   HOSPITAL COURSE:  Mr. Novosad is a 60 year old male with a previous history  of coronary artery disease.  He came to the hospital with chest pain and  was admitted for further evaluation and treatment.  His cardiac enzymes  elevated indicating a non-ST segment elevation MI.  He was taken to Cath  Lab on February 13, 2007.   The cardiac catheterization showed a totalled lesion in the circumflex  that extended into the OM.  This was treated with PTCA and two Cypher  stents reducing the stenosis to 0.  Residual coronary artery disease in  the ramus and RCA is to be treated medically.  His EF was 60% with  focal  lateral hypokinesis but no AS or MR.   A lipid profile was performed and his LDL was significantly elevated so  he was started on Zocor at 80 mg a day.  He developed anemia with a  hemoglobin of 10.3 and hematocrit 29.4.  It was reported that he had a  large amount of bloody stool.  A GI consult was called.   He was seen by Dr. Lina Sar who felt that painless bleeding in large  volume was most likely diverticular.  She felt that it was not likely  ischemic colitis or hemorrhoids.  She felt that he would need a  colonoscopy eventually but in view of his recent MI outpatient followup  is adequate.  His hemoglobin stabilized.   On February 15, 2007, Mr. Shatto was having no abdominal pain and had no  further GI bleeding.  He had been enrolled in the tracer study and is  being followed by research for this medication which is an adjunct to  his anticoagulation with  Plavix and aspirin.  Zocor had been added to  his medication regimen as well.  Cardiac Rehab has been seeing him and a  smoking cessation consult was called.  Mr. Humbarger is pending evaluation by  Dr. Antoine Poche but tentatively considered stable for discharge on February 15, 2007 with outpatient followup arranged.   DISCHARGE INSTRUCTIONS:  1. His activity level is to include no driving for 3 days.  2. He is not to lift for 3 weeks.  3. He is to stick to a low-sodium heart healthy diet.  4. He is to call our office for any problems with the catheterization      site.  5. He is to follow up with Dr. Lina Sar on April 2 at 2:00 p.m. and      follow up with Dr. Antoine Poche on March 11 at 3:00 p.m.  6. He is to follow up with his primary care physicians at [facility] as needed.   DISCHARGE MEDICATIONS:  1. Coated aspirin 325 mg daily.  2. Plavix 75 mg daily.  3. Toprol XL 25 mg daily.  4. Tracer study medication daily.  5. Colace 100 mg 2 tablets daily.  6. Simvastatin 80 mg daily.  7. Albuterol  nebulizers and inhalers as prior to admission.  8. Altace 5 mg daily.  9. Nitroglycerin 0.4 mg p.r.n.      Theodore Demark, PA-C      Rollene Rotunda, MD, [facility]  Electronically Signed    RB/MEDQ  D:  02/15/2007  T:  02/15/2007  Job:  [id number]   cc:   [facility]

## 2011-05-07 NOTE — H&P (Signed)
NAME:  JAJA, SWITALSKI NO.:  [id number]   MEDICAL RECORD NO.:  [id number]          PATIENT TYPE:  EMS   LOCATION:  MAJO                         FACILITY:  [facility]   PHYSICIAN:  Bevelyn Buckles. Bensimhon, MDDATE OF BIRTH:  06-17-1951   DATE OF ADMISSION:  02/12/2007  DATE OF DISCHARGE:                              HISTORY & PHYSICAL   PRIMARY CARE PHYSICIAN:  [facility].   CARDIOLOGIST:  Rollene Rotunda, MD, [facility]   REASON FOR ADMISSION:  Chest pain concerning for unstable angina.   HISTORY OF PRESENT ILLNESS:  Mr. Cheuvront is a 60 year old male with a  history of COPD with ongoing tobacco use, hyperlipidemia, and coronary  artery disease, status post inferior myocardial infarction in 2006,  treated with PTCA and stenting of the RCA in the Horizon Trial.  He had  a follow-up Horizon catheterization in August of 2007 which showed a  normal left main, 30% LAD, 60-70% serial lesions in the left circumflex,  and 30% in-stent restenosis in the RCA.  Ejection fraction was 55%.   From a cardiac perspective, he was doing well until today when he was  walking in the restaurant, he felt numb and weak transiently on his left  side.  It felt like his body was not working together.  There were no  visual changes, dysarthria, or falling.  This resolved quickly.  He went  home and then developed chest pain, shortness of breath, and diaphoresis  with some mild nausea.  He said this was a bit unlike his previous heart  attack as previously he did not have chest pain.  He tried to walk  around, but it did not get any better and it really did not get much  worse.  He did take a sublingual nitroglycerin which decreased his  symptoms somewhat, but did not relieve them.  He came to the emergency  room.  He was treated with IV nitroglycerin and morphine and the  symptoms resolved.  His EKG and first two sets of point of care markers  are negative.   REVIEW OF SYSTEMS:  He has  chronic dyspnea which is unchanged.  No lower  extremity edema, no orthopnea, and no PND.  He does have a lot of mucous  and occasional cough.  No fevers or chills.  No bright red blood per  rectum, no melena.  He does have some mild what appears to be  claudication.  No other neuro symptoms except as described above in the  history of present illness.  The remainder of review of systems is  negative except for HPI and problem list.   PROBLEM LIST:  1. Coronary artery disease, status post inferior myocardial infarction      in 2006, treated with percutaneous transluminal coronary      angioplasty and stenting of the right coronary artery in the      Horizon Trial.      a.     A follow-up Horizon catheterization with nonobstructive       disease in August of 2007 as per HPI.  There was  normal left       ventricular function.  2. Chronic obstructive pulmonary disease with ongoing tobacco use.  3. Hyperlipidemia with intolerance of Lipitor due to myalgias.  4. Borderline hypertension.   CURRENT MEDICATIONS:  1. Aspirin 325 mg a day.  2. Toprol XL 25 mg a day.  3. Altace 5 mg a day.  4. He was previously taking IMDUR, but stopped.  5. He was also on Lipitor, but stopped.  6. Now he is taking Cholestoff over-the-counter for his cholesterol.  7. Albuterol nebulizer.   ALLERGIES:  No known drug allergies.  Intolerances are LIPITOR due to  myalgias.   SOCIAL HISTORY:  He lives with his wife in [place].  He is on  disability.  He used to work as a Curator.  He has a history of  extensive tobacco use up to 3 packs a day, but now just 2-3 cigarettes a  day.  Denies alcohol.   FAMILY HISTORY:  His father died in the 51's due to lung cancer.  Mother was alive in her 56's and has Alzheimer's disease and a history  of stroke.  She is in a nursing home.   PHYSICAL EXAMINATION:  GENERAL:  He is in no acute distress.  VITAL SIGNS:  Respirations are unlabored.  He is afebrile.  Blood   pressure is 135/60, heart rate is 59, he is saturating in the high 90's  on 2 liters.  HEENT:  Sclerae anicteric, EOMI, no xanthelasma.  Mucous membranes are  moist, oropharynx is clear.  NECK:  Supple with no JVD.  Carotids are 2+ bilaterally without any  bruits.  There is no lymphadenopathy or thyromegaly.  HEART:  He has very distant heart sounds, he is regular.  Unable to  appreciate any murmurs, rubs, or gallops.  LUNGS:  Poor air movement with no wheezes or rales.  ABDOMEN:  Obese, nontender, nondistended.  No appreciable  hepatosplenomegaly, no bruits, no masses, good bowel sounds.  EXTREMITIES:  Warm with no cyanosis, clubbing, or edema.  DP pulses are  2+ bilaterally.  Femoral pulses are 2+ bilaterally without bruits.  NEUROLOGY:  He is alert and oriented x3.  Cranial nerves II-XII grossly  intact.  He moves all four extremities without difficulty.  There is no  pronator drift.  Babinski's are downgoing.  Sensation appears to be  intact in both upper and lower extremities.   LABORATORY DATA:  White count 7.3, hemoglobin 14.5, platelets 189,  sodium 142, potassium 4.0, chloride 104, bicarb 29, BUN 14, creatinine  0.8.  Point of care troponins are less than 0.05 x 2, MB's are negative.   Chest x-ray shows COPD with no acute cardiopulmonary processes.   ASSESSMENT:  1. Chest pain concerning for unstable angina.  2. Coronary artery disease status post inferior myocardial infarction      in 2006 with percutaneous transluminal coronary angioplasty and      stenting of the right coronary artery.  3. Chronic obstructive pulmonary disease with ongoing tobacco use.  4. Transient left-sided numbness, question transient ischemic attack.  5. Hyperlipidemia, intolerant of Lipitor secondary to myalgias.   PLAN/DISCUSSION:  We will admit him to telemetry for complete rule out  myocardial infarction.  We will treat him with Lovenox, nitroglycerin, and aspirin.  We will load Plavix and a  statin.  He will get a cardiac  catheterization tomorrow.  We will also check a non contrast head CT to  rule out any possibility of TIA or stroke,  though, by history I think  this is unlikely.  He will also need a smoking cessation consultation.      Bevelyn Buckles. Bensimhon, MD  Electronically Signed     DRB/MEDQ  D:  02/12/2007  T:  02/12/2007  Job:  [id number]   cc:   Rollene Rotunda, MD, [facility]  [facility]

## 2011-05-07 NOTE — Assessment & Plan Note (Signed)
[facility]                              CARDIOLOGY OFFICE NOTE   NAME:Hartman, DHRUV CHRISTINA                       MRN:          [id number]  DATE:07/29/2006                            DOB:          1951/02/12    HISTORY OF PRESENT ILLNESS:  Mr. Curran is a 60 year old gentleman status post  inferior myocardial infarction in 2006.  Yesterday, he underwent  intravascular ultrasound as part of follow up for his participation in the  Horizon study.  The procedure was complicated by some bleeding at the  AngioSeal deployment site.  He was asked to follow up today to reassess his  groin.  He has had no trouble with his groin over night and has generally  felt well.   PHYSICAL EXAMINATION:  VITAL SIGNS:  Heart rate:  72.  Blood pressure:  118/74.  GENERAL:  He is generally well appearing, in no distress.  EXTREMITIES:  The right groin has a 2+ pulse without hematoma or bruits.  DP  and PT pulses are both 2+ on the right.   ASSESSMENT/RECOMMENDATIONS:  1. Right groin:  Appears normal after AngioSeal closure yesterday.  2. Exertional dyspnea:  Unclear how much his circumflex lesion may be      contributing to this.  COPD may also be playing a role.  He is to      establish care with pulmonary and then follow up with Dr. Antoine Poche in      the next week or two for decision on further management.                                 Salvadore Farber, MD    WED/MedQ  DD:  07/29/2006  DT:  07/29/2006  Job #:  [id number]   cc:   Micheline Chapman, MD  Rollene Rotunda, MD, [facility]

## 2011-05-07 NOTE — Cardiovascular Report (Signed)
NAME:  [name], BROSH NO.:  [id number]   MEDICAL RECORD NO.:  [id number]          PATIENT TYPE:  INP   LOCATION:  2852                         FACILITY:  [facility]   PHYSICIAN:  Jonelle Sidle, M.D. LHCDATE OF BIRTH:  [birth year]   DATE OF PROCEDURE:  DATE OF DISCHARGE:  11/15/2005                              CARDIAC CATHETERIZATION   Dr. Lyn Hollingshead the cardiac catheterization report on ellipses done [id number].   PRIMARY CARDIOLOGIST:  Dr. Rollene Rotunda.   PRIMARY CARE PHYSICIAN:  Dr. Nila Nephew.   INDICATIONS:  Mr. Hemp is a 60 year old male with history of inferior wall  myocardial infarction back in July of this year status post overlapping  stent placement as part of the Horizon study within the proximal right  coronary artery by Dr. Riley Kill. Additional history includes prior tobacco  use, hyperlipidemia and symptoms of potential claudication. He is now  readmitted to the hospital having developed symptoms of recurrent chest  discomfort. He ruled out for myocardial infarction and had a nonspecific  electrocardiogram. Of note, he also underwent a recent outpatient Myoview  study which showed no clear evidence of ischemia. He is referred now for  diagnostic coronary angiography to clearly assess the coronary anatomy for  stent patency and progression of other residual atherosclerosis. Risks and  benefits were explained to the patient and informed consent was obtained  prior to proceeding.   PROCEDURE PERFORMED:  1.  Left heart catheterization.  2.  Selective coronary angiography.  3.  Ventriculography.  4.  Distal aortography.   ACCESS AND EQUIPMENT:  The area about the right femoral artery was  anesthetized with 1% lidocaine. With use of a Smart needle, a 6-French  sheath was placed in the right femoral artery via the modified Seldinger  technique. Standard preformed 6-French JL-4 and JR-4 catheters were used for  selective coronary angiography and an  angled pigtail catheter was used for  left heart catheterization and left ventriculography. All exchanges were  made over wire. The patient tolerated procedure well without immediate  complications.   HEMODYNAMIC RESULTS:  Aorta 108/63 mmHg. Left ventricle 108/11 mmHg.   ANGIOGRAPHIC FINDINGS:  1.  Left main coronary artery is free of significant flow-limiting coronary      atherosclerosis. This vessel gives rise to the left anterior descending,      circumflex and small ramus intermedius branch.  2.  The left anterior descending is a medium-caliber vessel with three      diagonal branches, the third of which is the largest. There are minor      luminal irregularities noted with approximately 20% proximal stenosis,      40-50% mid-vessel stenosis, and 30% distal stenosis. There is a small      relatively focal apical narrowing of approximately 30% as well. No      clearly flow-limiting stenoses are noted.  3.  The circumflex coronary artery is a medium-caliber vessel with two      diagonal branches and small AV groove branch. The second obtuse marginal      is the largest and there is approximately  30% stenosis noted in the mid-      vessel segment with other minor luminal irregularities.  4.  The right coronary artery is a relatively large vessel with a posterior      descending branch. Visualized are overlapping stents in the proximal      segment of the vessel which are widely patent. Flow was TIMI III      throughout the vessel. On initial injection, there was approximately 50%      stenosis at the ostium of the vessel with some evidence of catheter      damping with prolonged engagement. A 150 micrograms of intracoronary      nitroglycerin was administered with improvement in the stenosis to      approximately 30-40% although not total resolution. Catheter damping was      not present at that time and it is likely that there is a combination of      plaque with catheter-induced  spasm, although intrinsic spasm was also a      possibility.   Left ventriculography is performed in the RAO projection. It revealed an  ejection fraction of approximately 60% with no focal anterior or inferior  wall motion abnormality and no significant mitral regurgitation.   Distal aortography reveals patent bilateral renal arteries with mild  plaquing in the aortoiliac system but no clearly flow-limiting stenoses in  the proximal vessels. Distal vasculature is not well seen.   DIAGNOSES:  1.  Coronary disease as described including mild to moderate nonobstructive      left system disease with patent overlapping stents in the proximal right      coronary artery. There was evidence of spasm to approximately 50% in the      ostium of the right coronary artery likely a combination of plaque and      possibly catheter-induced spasm. Residual stenosis following      intracoronary nitroglycerin was approximately 30-40%. TIMI III flow was      observed throughout. The patient had no symptoms at this time.  2.  Left ventricular ejection fraction approximately 60% with no focal wall      motion abnormality, left end-diastolic pressure of 11 mmHg, and no      significant mitral vegetation.  3.  Mild aortoiliac atherosclerosis without any obvious flow-limiting      stenoses. Patent bilateral renal arteries are noted.   DISCUSSION:  I reviewed the results with the patient. Plan at this point  will be for intensification of medical therapy including the addition of  Imdur 30 milligrams p.o. daily in case there is some element of transient  spasm involved in the patient's symptoms. We will also place the patient on  Protonix 40 milligrams p.o. q.d. given the possibility of gastroesophageal  reflux particular as the patient has been on Plavix and is now on a proton  pump inhibitor. Otherwise he will remain on his outpatient medical regiment and he will have planned follow-up Dr. Antoine Poche or  physician assistant over  the next 1-2 weeks. In addition, would also recommend lower extremity  Dopplers/ABI studies to exclude distal peripheral arterial disease not seen  with this particular study.           ______________________________  Jonelle Sidle, M.D. [facility]     SGM/MEDQ  D:  11/15/2005  T:  11/15/2005  Job:  [id number]   cc:   Rollene Rotunda, M.D.  [address]  [address]  [place]  [place] [address]

## 2011-05-07 NOTE — Assessment & Plan Note (Signed)
[facility]                               PULMONARY OFFICE NOTE   NAME:[name], [name]                       MRN:          [id number]  DATE:08/08/2006                            DOB:          08/20/1951    HISTORY:  This is a 60 year old white male with chronic dyspnea starting  around 2005 when he was admitted for pneumonia and stopped smoking at that  point.  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  He also has been having nocturnal  episodes of choking and coughing for the last several months.  However, he  denies any excessive sputum production, fevers, chills, sweats, orthopnea,  PND or leg swelling.   PAST MEDICAL HISTORY:  1. Ischemic heart disease, status post MI in July 2006, with last Myoview      November 2006 showing an ejection fraction of 58%.  2. He also has hyperlipidemia.   ALLERGIES:  None known.   Medications were reviewed with the patient in detail and do include p.r.n.  Pulmicort and albuterol, which he says don't do much.  Note that he is on  Altace chronically.   SOCIAL HISTORY:  He quit smoking in July 2006.  He is on disability.  No  unusual travel, pets or hobbies.   FAMILY HISTORY:  Family history was reviewed with the patient in detail and  recorded on the work sheet, significant for the fact that his father had  lung cancer.  No other respiratory diseases, including asthma or atopy.   REVIEW OF SYSTEMS:  Also taken in detail on the worksheet.  Significant for  problems as outlined above.   PHYSICAL EXAMINATION:  GENERAL:  This is a very hoarse ambulatory white male  in no acute distress, with frequent throat-clearing.  VITAL SIGNS:  He was afebrile with normal vital signs, with Blood pressure  120/84.  HEENT:  A full set of dentures.  Oropharynx was clear with no evidence  of  excessive postnasal drainage or cobblestoning.  NECK:  Supple without cervical adenopathy or tenderness.  Trachea is  midline.  No thyromegaly.  CHEST:  Lung fields perfectly clear bilaterally to auscultation and  percussion, although there was slight hyperresonance to percussion and  breath sounds were slightly diminished.  There was negative Hoover sign.  CARDIAC:  Regular rhythm without murmur, gallop, or rub.  S1, S2 were  diminished.  ABDOMEN:  Soft, benign.  EXTREMITIES:  Warm without calf tenderness, cyanosis, clubbing or edema.   Chest x-ray from July 22, 2006, was available for review and revealed mild  hyperinflation.   IMPRESSION:  This patient has classic vocal cord dysfunction by exam with  nocturnal choking that is suggestive of reflux.  However, ACE inhibitors  and reflux together like sparks and gasoline, in my experience, and it may  be difficult to treat his reflux adequately in the presence of ACE  inhibitors because of the synergistic  effect on upper airway bradykinins.  I  therefore would strongly suggest a trial off of Altace and have him in the  meantime start taking Zegerid at bedtime 40 mg at bedtime and then return  here at 6 weeks for a full set of PFTs.                                   Charlaine Dalton. Sherene Sires, MD, [facility]   MBW/MedQ  DD:  08/09/2006  DT:  08/09/2006  Job #:  [id number]   cc:   Rollene Rotunda, MD, [facility]

## 2011-05-07 NOTE — H&P (Signed)
NAME:  [name], [name] NO.:  [id number]   MEDICAL RECORD NO.:  [id number]                   PATIENT TYPE:  EMS   LOCATION:  MAJO                                 FACILITY:  [facility]   PHYSICIAN:  Jackie Plum, M.D.             DATE OF BIRTH:  1951/01/25   DATE OF ADMISSION:  03/31/2004  DATE OF DISCHARGE:                                HISTORY & PHYSICAL   CHIEF COMPLAINT:  Shortness of breath for several days.   HISTORY OF PRESENT ILLNESS:  The patient is a 59 year old Caucasian  gentleman with no prior history of COPD or asthma or CHF.  He presents with  several days' history of shortness of breath which worsened acutely  yesterday, necessitating ER visit.  Shortness of breath is said to be  moderate-to-severe in severity.  It is not associated with any orthopnea but  having some cough productive of yellow sputum with fever and some lethargy,  according to the patient.  He said that he might have passed out today at  [employer].  He denies any history of chest pain, palpitations, PND,  orthopnea, leg or calf pain, ankle swelling, sore throat, __________,  headache, visual changes, dysuria or frequency of micturition.  He vomited  once with some nausea, no abdominal pain, black or bloody stools or chills.   At the ED, the patient was noted to be in respiratory distress and febrile.  He was said to be wheezing and he was started on Ventolin nebulizations and  given Zithromax and Rocephin and we were asked to evaluate for admission.   PAST MEDICAL HISTORY:  The patient denies any previous diagnosis of COPD or  CHF or asthma.   MEDICATIONS:  He is not on any regular medications.   PRIMARY CARE PHYSICIAN:  He does not have any primary care physician.   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  He smokes 1 pack of cigarettes a day for more than 50 years.  He drinks  alcohol occasionally.   FAMILY HISTORY:  He  denies any known familial history of heart disease or  stroke or asthma.   REVIEW OF SYSTEMS:  The positives and negatives are as in the HPI but  otherwise, review of systems was unremarkable.   PHYSICAL EXAMINATION:  VITAL SIGNS:  BP was 115/64, pulse rate of 114,  respiratory rate of 38 had come down to 24 at time of my evaluation, his  temperature was 103.0 degrees Fahrenheit, O2 saturation of 88% on room air.  GENERAL:  He was in mild respiratory distress.  HEENT:  Normocephalic, atraumatic.  Pupils equal, round and reactive to  light.  Extraocular movements were intact.  Oropharynx was slightly dry  without any exudation or erythema.  NECK:  Neck was supple with no JVD.  LUNGS:  He had bilateral rhonchi with expiratory wheezes.  CARDIAC:  Mildly tachycardia without any gallops or murmur.  ABDOMEN:  Abdomen was full, nontender, soft.  Bowel sounds were present;  they were normoactive.  EXTREMITIES:  He did not have any edema.  CNS:  He was alert and oriented x3, no acute focal deficits.   LABORATORIES:  X-ray was reviewed; it showed COPD changes with what looks  like early right middle lobe infiltrate.   Twelve-lead EKG shows sinus tachycardia at 104 beats per minute and right  axis deviation and intraventricular conduction defect without any acute ST  wave changes.   Blood cultures were drawn by the ED physician; results are pending and needs  followup.  Sodium was 135, potassium 3.7, chloride 100, CO2 26, glucose 105,  BUN 15, creatinine 1.5, calcium 8.5; total protein 6.3, albumin 3.6, AST 17,  ALT 14, alkaline phosphatase 65, bilirubin 0.8.  CBC:  WBC count 10.3,  hemoglobin 13.4, hematocrit 38.3, MCV of 3.2, platelet count 179,000;  absolute neutrophil count 8.0 k/mL.  BNP less than 30.0.  UA was negative  for urinary tract infection.  ABG:  PCO2 of 40.6, PO2 of 45.0, pH of 7.42,  bicarb of 26.3, O2 saturation of 81%.  Myoglobin 226, CK-MB less than 1.0,  troponin I less  than 0.05.   ASSESSMENT:  [age]-year-old gentleman with no prior history of chronic  obstructive pulmonary disease, congestive heart failure or asthma,  presenting with worsening shortness of breath.  This was preceded by episode  of flu symptoms last week according to the patient.  The patient has a  fever.  He does not have any leukocytosis.  Lungs exam is notable for  rhonchi with expiratory wheezes.  X-ray shows chronic obstructive pulmonary  disease changes with what looks like early middle lobe infiltrate.   IMPRESSION:  1. Community-acquired pneumonia.  2. Chronic obstructive pulmonary disease.   PLAN OF CARE:  The patient will be admitted to a telemetry bed.  He will get  IV steroids, nebulizations, antibiotics, supportive measures including IV  fluids and antiemetics, expectorates with pulmonary toilet and measures will  be instituted.  The patient may need respiratory evaluation for COPD or  obstructive lung disease on discharge.                                                Jackie Plum, M.D.    GO/MEDQ  D:  03/31/2004  T:  03/31/2004  Job:  [id number]

## 2011-05-07 NOTE — Assessment & Plan Note (Signed)
[facility]                              CARDIOLOGY OFFICE NOTE   NAME:[name], [name]                       MRN:          [id number]  DATE:07/22/2006                            DOB:          09-22-1951    PRIMARY CARDIOLOGIST:  Rollene Rotunda, MD, [facility]   PRIMARY CARE PHYSICIAN:  [facility], Dr. Gean Birchwood.   HISTORY OF PRESENT ILLNESS:  Mr. General is a very pleasant 60 year old male  patient with history of coronary disease status post inferior ST elevation  myocardial infarction in July of 2006 treated with a Horizon study stent to  the RCA who presents to the office today for research followup.  He did have  a relook catheterization as part of the Horizon study.  In the office today,  he does tell me about some symptoms of chest pain he had about two weeks  ago.  This woke him from sleep and was a sharp, stabbing pain that was  epigastric and substernal.  It would wax and wane over several minutes to  possible 30-60 minutes.  It happened several nights in a row and then  resolved.  He denies any aggravating or alleviating factors.  He did not  really have any radiating symptoms.  He was a little bit more short of  breath on those nights.  He denies any recurrent symptoms since then and has  not had any exertional symptoms as well.  His symptoms were nothing like his  myocardial infarction, and today, he actually feels pretty well.  He does  have COPD and has chronic shortness of breath with exertion from that, and  that has not really changed any.   PAST MEDICAL HISTORY:  1.  Coronary disease status post inferior ST elevation myocardial infarction      in July of 2006 treated with a Horizon study stent to the RCA x2.  He      had a relook catheterization in November of 2006 with LAD 20% proximal      stenosis, 40-50% mid stenosis, 30% distal stenosis, circumflex with      second obtuse marginal 30% stenosis, and RCA with 30-40%  ostial stenosis      with patent stent.  He also had a Myoview scan in November of 2006 that      revealed an EF of 58% and no ischemia.  2.  Treated dyslipidemia.  3.  COPD.  4.  Status post appendectomy.   CURRENT MEDICATIONS:  1.  Aspirin __________ mg daily.  2.  __________ mg daily.  3.  Metoprolol 12.5 mg daily.  4.  Altace 5 mg daily.  5.  Lipitor 80 mg q.h.s.  6.  Albuterol three times a day.  7.  Multivitamin daily.  8.  Imdur 30 mg daily.  9.  Nitroglycerin p.r.n. chest pain.  10. Pulmicort p.r.n.   ALLERGIES:  NO KNOWN DRUG ALLERGIES.   SOCIAL HISTORY:  The patient denies any current tobacco abuse or alcohol  abuse.   FAMILY HISTORY:  Insignificant for coronary artery disease.   REVIEW OF  SYSTEMS:  See HPI.  Denies any fevers, chills, cough, melena,  hematochezia, hematuria, dysuria.  The rest of the review of systems is  negative.   PHYSICAL EXAMINATION:  GENERAL:  He is a well-nourished, well-developed man  in no acute distress.  VITAL SIGNS:  Blood pressure 116/78, pulse 67, weight 193 pounds.  HEENT:  Normocephalic, atraumatic.  Eyes:  PERRLA.  EOMI.  Sclerae clear.  Oropharynx pink without exudate.  NECK:  Without lymphadenopathy, no thyromegaly.  Carotids 2+ bilateral  without bruits.  CARDIAC:  Normal S1 and S2.  Regular rate and rhythm without murmurs,  clicks, rubs, or gallops.  LUNGS:  Clear to auscultation with decreased breath sounds without wheezing,  rhonchi, or rales.  ABDOMEN:  Soft, nontender, with normoactive bowel sounds.  No organomegaly.  EXTREMITIES:  Without edema.  Calves are soft and nontender bilaterally.  Femoral pulses are 2+ bilateral without bruits.  NEUROLOGIC:  Alert and oriented x3.  Cranial nerves II-XII are grossly  intact.  Strength is 5/5 in all extremity axial groups.  SKIN:  Warm and dry.   DIAGNOSTIC STUDIES:  Electrocardiogram reveals sinus rhythm with a heart  rate of 67, rightward axis, no significant changes from  previous tracings.   IMPRESSION AND PLAN:  1.  Chest pain.  The patient has no objective evidence of ischemia.  His      symptoms are quite atypical.  He is already set up for a cardiac      catheterization next week as part of his followup for Horizon study.  I      think his symptoms sound more gastrointestinal in nature than anything      else.  I have recommended that he get back on Protonix 40 mg daily.  2.  Coronary artery disease.  As noted above, the patient is set up for      outpatient cardiac catheterization next week as part of the Horizon      study followup.  He should continue on his beta-blocker, angiotension-      converting enzyme inhibitor, statin, aspirin, and Plavix.  3.  Dyslipidemia.  As noted above, the patient will continue on Lipitor.   DISPOSITION:  The patient is scheduled for cardiac catheterization next  week.  He will follow up with Dr. Antoine Poche after this is scheduled.                                  Chris Newcomer, PA-C                              Chris Meres, MD   SW/MedQ  DD:  07/22/2006  DT:  07/22/2006  Job #:  [id number]   cc:   Feliberto Gottron. Turner Daniels, MD

## 2011-05-07 NOTE — Discharge Summary (Signed)
NAME:  [name], [name] NO.:  [id number]   MEDICAL RECORD NO.:  [id number]          PATIENT TYPE:  INP   LOCATION:  2852                         FACILITY:  [facility]   PHYSICIAN:  Jonelle Sidle, M.D. LHCDATE OF BIRTH:  09-23-1951   DATE OF ADMISSION:  11/13/2005  DATE OF DISCHARGE:  11/15/2005                                 DISCHARGE SUMMARY   PRIMARY CARDIOLOGIST:  Dr. Rollene Rotunda.   PROCEDURES:  Cardiac catheterization November 15, 2005.   REASON FOR ADMISSION:  Mr. Sens is a 60 year old male, with known coronary  artery disease status post acute inferior myocardial infarction treated with  emergent stenting of the right coronary artery in July 2006, who was seen by  me in the office last week for evaluation of atypical chest pain.  He was  referred for an adenosine and stress Myoview, which was negative for  ischemia?; ejection fraction 58 percent.  However, he continued to have  recurrent chest pain and presented to the Emergency Room later that evening  with symptoms worse than __________  of angina pectoris.   LABORATORY DATA:  Normal electrolytes, renal function, and CBC.  Zero  cardiac markers all within normal limits, D-dimer 0.25, elevated serum  glucose 109.   HOSPITAL COURSE:  The patient was admitted for further evaluation of  symptoms worrisome for unstable angina pectoris, ruled out for myocardial  infarction with all zero cardiac markers within normal limits, with plans to  proceed with cardiac catheterization on Monday.   Cardiac catheterization, performed by Dr. Simona Huh (See report for full  details,) revealed non-critical coronary artery disease with widely patent  right coronary artery stents and normal left ventricular function.  Specifically, there was a 50 percent osteo right coronary artery lesion with  associated spasm, which improved to 30 __________  from 40 percent stenosis  after treatment with intracoronary  Nitroglycerine.  Given this finding, Dr.  Diona Browner recommended adding low-dose of Imdur, as well as Protonix, to guard  against the possible reflux of symptoms on Plavix and Aspirin.   Of note, patient also presented with complaint of lower extremity burning,  and thus had a distal aortogram done as well.  This revealed only mild  atherosclerotic changes.  The patient has, however, been scheduled for  outpatient lower extremity arterial Dopplers.  Question, arterial Dopplers  to exclude any significant distal peripheral vascular disease.   Arrangements were made for discharge following cardiac catheterization.  Medication adjustments notable for the addition of low-dose Imdur and  Protonix.   MEDICATIONS AT DISCHARGE:  1.  Imdur 30 mg q.d. (New.)  2.  Protonix 40 mg q.d. (New.)  3.  Coated Aspirin 325 mg q.d.  4.  Plavix 75 mg q.d.  5.  Toprol XL 25 mg q.d.  6.  Altace 5 mg q.d.  7.  Lipitor 80 mg q.d.  8.  Spiriva.  9.  Albuterol MDI as previously directed.   INSTRUCTIONS:  1.  Follow-up with Gene Serpe, P.A.C. on Tuesday, November 16, 2005, at      12:30 p.m.  2.  The patient is scheduled for lower extremity arterial Dopplers on      November 23, 2005 at 3:30 p.m.  3.  Patient is scheduled to follow-up with Dr. Rollene Rotunda on December 22, 2005 at 9:30 a.m.   DISCHARGE INSTRUCTIONS:  Refrain from any heavy lifting/driving x2 days.  Duration of discharge instruction 32 minutes.   PRINCIPAL DIAGNOSES:  1.  Recurrent chest pain/known coronary artery disease.      1.  Non-critical coronary artery disease by cardiac catheterization,          November 15, 2005.      2.  Question __________  right coronary artery spasm.      3.  Negative outpatient adenosine and stress Myoview November 12, 2005.      4.  Status post inferior myocardial infarction/emergent stent 100          percent right coronary artery July 08, 2005.      5.  Normal left ventricular function.   SECONDARY  DIAGNOSES:  1.  Hyperlipidemia.  2.  History of tobacco.  3.  Bilateral lower extremity discomfort.      Gene Serpe, P.A. [facility]    ______________________________  Jonelle Sidle, M.D. [facility]    GS/MEDQ  D:  11/15/2005  T:  11/16/2005  Job:  [id number]   cc:   Erskine Speed, M.D.  Fax: [phone]

## 2011-05-07 NOTE — Discharge Summary (Signed)
NAME:  CHARLOTTE, BRAFFORD NO.:  [id number]   MEDICAL RECORD NO.:  [id number]          PATIENT TYPE:  INP   LOCATION:  2006                         FACILITY:  [facility]   PHYSICIAN:  Theodore Demark, PA-C   DATE OF BIRTH:  1951/12/02   DATE OF ADMISSION:  02/12/2007  DATE OF DISCHARGE:  02/16/2007                               DISCHARGE SUMMARY   ADDENDUM:  On February 15, 2007, Mr. Pustejovsky was seen by Dr. Jens Som who  recommended an additional 24 hours of hospitalization because he had had  another bloody stool.  On February 16, 2007, Mr. Pickar was seen by Dr.  Antoine Poche.  His bowel movements were improving.  His CBC was stable with  a hemoglobin of 10.8 and a hematocrit of 31.7.  In discussion with Mr.  Putt it was found that he was having a great deal of difficulty  affording his medications.  In order to keep him on the simvastatin and  other drugs he was converted to medications which [facility] at the rate of $15 for 3 month supply.  Therefore his medication  list has changed.  His final discharge medication list is as follows:  1. Coated aspirin 325 mg q. day.  2. Plavix 75 mg q. day.  3. __________ medication daily.  4. Colace 100 mg 2 tabs q. day.  5. Simvastatin 80 mg a day.  6. Albuterol nebulizers and inhalers as prior to admission.  7. Nitroglycerin sublingual p.r.n.  8. Lisinopril 5 mg q. day.  9. Felodipine 40 mg q. day.  10.Metoprolol 25 mg 1/2 tab b.i.d.  11.He is not to take Lipitor, Toprol or Altace.   Mr. Therien discharge instructions are otherwise unchanged except for a  CBC to be obtained in one week at the [facility] office and this is to be  done on March 6 at 9 a.m.   TIME AT DISCHARGE:  42 minutes.      Theodore Demark, PA-C     RB/MEDQ  D:  02/16/2007  T:  02/16/2007  Job:  [id number]

## 2011-05-07 NOTE — H&P (Signed)
NAME:  [name], [name] NO.:  [id number]   MEDICAL RECORD NO.:  [id number]          PATIENT TYPE:  INP   LOCATION:  1828                         FACILITY:  [facility]   PHYSICIAN:  Olga Millers, M.D. LHCDATE OF BIRTH:  1951/07/10   DATE OF ADMISSION:  11/13/2005  DATE OF DISCHARGE:                                HISTORY & PHYSICAL   HISTORY OF PRESENT ILLNESS:  Mr. Mccaskey is a 60 year old male patient of Rollene Rotunda, M.D., with a past medical history of coronary artery disease, COPD  and hyperlipidemia who presents with chest pain.  The patient's cardiac  history dates back to July 2006.  At that time he presented with an acute  myocardial infarction.  He underwent urgent cardiac catheterization and his  ejection fraction was noted to be 55%.  There was a 40-50% stenosis in the  proximal LAD, a 30% lesion in the second marginal and the right coronary  artery was occluded.  He underwent PCI of his right coronary artery at that  time, with a Horizon study stent.  He has done well since then.  He was seen  in the office earlier this week in follow-up, and was scheduled to have a  nuclear study to reassess the right coronary artery.  This was performed  yesterday, but the results were not available.  He also has been complaining  of leg pain bilaterally and it was felt that he would need Dopplers.   This morning he awoke with heaviness in his chest, along with a burning  sensation.  These symptoms radiated to his left proximal arm.  There was  associated shortness of breath and diaphoresis.  The pain was non-pleuritic  and positional.  It should be noted that he states these symptoms are  similar to his infarct pain.  They last for approximately one hour and then  resolved spontaneously.  He is now in the emergency room and cardiology is  asked to further evaluate.   PAST MEDICAL HISTORY:  Significant for hyperlipidemia, but there is no  diabetes mellitus or  hypertension.  He does have a history of COPD.  His  coronary artery disease is outlined in the HPI.  He has had a prior  appendectomy.  He has also had a history of osteomyelitis.   SOCIAL HISTORY:  He does have a history of tobacco use, but has not smoked  since July 2006 (by his report).  There is no alcohol use.   FAMILY HISTORY:  Positive for coronary artery disease.   MEDICATIONS:  1.  Aspirin 325 mg daily.  2.  Plavix 75 mg daily.  3.  Toprol 25 mg daily.  4.  Altace 5 mg p.o. daily.  5.  Lipitor 80 mg p.o. daily.  6.  Spiriva and albuterol.   ALLERGIES:  NO KNOWN DRUG ALLERGIES.   REVIEW OF SYSTEMS:  He denies any headaches, fevers or chills.  There is no  productive cough or hemoptysis.  There is no dysphagia, odynophagia, melena  or hematochezia.  There is no dysuria or hematuria.  There is no  seizure  activity.  There is no orthopnea, PND or pedal edema.  He does complain of  pain in his lower extremities, right greater than left.  The remaining  systems are negative.   PHYSICAL EXAMINATION:  VITAL SIGNS:  Blood pressure 135/83, pulse 73.  GENERAL:  He is well developed, well nourished; in no acute distress.  SKIN:  Warm and dry.  There was mild peripheral clubbing.  HEENT:  Unremarkable.  NECK:  Supple with normal motion bilaterally, and I cannot appreciate  bruits.  There is no jugular venous distention and no thyromegaly.  CHEST:  Shows increased AP diameter.  There are diminished breath sounds  throughout, but otherwise clear to auscultation.  CARDIOVASCULAR:  Reveals extremely distant heart sounds.  There is a regular  rate and rhythm.  I cannot appreciate murmurs, rubs or gallops.  ABDOMEN:  Nontender and nondistended.  Positive bowel sounds.  No  hepatosplenomegaly and no masses appreciated.  There was no abdominal bruit.  EXTREMITIES:  He has 1+ femoral pulses bilaterally and I cannot appreciate  bruits.  His extremities show no edema and I could palpate no  cords.  He has  1+ posterior tibial pulses bilaterally.  NEUROLOGIC:  Grossly intact.   EKG:  (Done today)  Shows a sinus rhythm at a rate of 74.  The axis is  normal.  There are no ST changes noted.   DIAGNOSES:  1.  Unstable angina.  2.  History of coronary artery disease, status post PCI of the right      coronary artery in July 2006 -- in the setting of an inferior infarct.  3.  Hyperlipidemia.  4.  Chronic obstructive pulmonary disease.  5.  Remote tobacco abuse.  6.  Possible claudication.   PLAN:  Mr. Conover presents with symptoms that are very concerning, given his  cardiac history.  We will admit to telemetry and rule out myocardial  infarction with serial enzymes.  We will continue with his aspirin, Plavix,  Toprol, Altace and Lipitor.  I will add Lovenox to his medical regimen a 1  mg/kg subcutaneously q.12 h.  We will plan to proceed with cardiac  catheterization on Monday (the risks and benefits have been discussed, and  he agrees to proceed).  We will also proceed with a distal aortogram, given  his possible claudication.  Finally, we need to view the results of the  stress test from yesterday.  He needs to continue with his risk factor  modifications.           ______________________________  Olga Millers, M.D. [facility]     BC/MEDQ  D:  11/13/2005  T:  11/13/2005  Job:  [id number]

## 2011-05-07 NOTE — Assessment & Plan Note (Signed)
[facility]                              CARDIOLOGY OFFICE NOTE   NAME:[name]                       MRN:          [id number]  DATE:08/11/2006                            DOB:          Sep 12, 1951    PRIMARY:  [facility]   REASON FOR PRESENTATION:  The patient has coronary disease and shortness of  breath.   HISTORY OF PRESENT ILLNESS:  The patient is a pleasant, 60 year old  gentleman with coronary disease, as described below.  He had a recent  evaluation as part of the HORIZON study.  This included a catheterization.  He was having chest discomfort at that time.  This was done earlier this  month.  He was found to have a normal left main, the LAD had a long 30%  stenosis.  There was a 60-70% stenosis in a large circumflex and marginal  branch.  He had patent stents in the RCA.  He underwent IVUS as part of the  study and this demonstrated that this was patent.  He has had no chest  discomfort, neck discomfort, arm discomfort, activity-induced nausea or  vomiting, excessive diaphoresis.  He has had no palpitations, presyncope or  syncope.  He has had continued dyspnea, such as pushing a lawn mower and  walking up a short incline.  He has had shortness of breath, bending over.  He did see Dr. Sherene Sires, but no studies were ordered at that time.  He was due  to have followup.  He is not describing any resting shortness of breath and  has no PND or orthopnea.  He has had no palpitations, presyncope or syncope.  There is no lower extremity swelling.   PAST MEDICAL HISTORY:  Coronary artery disease (see above).  He had  myocardial infarction in July 2006.  He had HORIZON study with a stent to  his RCA times two, treated dyslipidemia, COPD, appendectomy.   ALLERGIES:  None.   MEDICATIONS:  1. Aspirin 375 mg daily.  2. Plavix 75 mg daily.  3. Toprol 25 mg daily.  4. Altace 5 mg daily.  5. Lipitor 80 mg at bedtime.  6.  Multivitamin.  7. Imdur 30 mg daily.   REVIEW OF SYSTEMS:  As stated in the HPI, and otherwise negative for other  systems.   PHYSICAL EXAMINATION:  GENERAL:  The patient is in no distress.  VITAL SIGNS:  Blood pressure 132/80, weight 194, heart rate 68 and regular.  HEENT:  Eyes are unremarkable.  Pupils were equal, round and reactive to  light.  Normal fundi.  Normal ears and nose.  NECK:  No jugular venous distention at 45 degrees.  Carotid upstroke brisk  and symmetric.  No bruits, no thyromegaly.  LYMPHATICS:  No adenopathy.  LUNGS:  Clear to auscultation bilaterally.  BACK:  No costovertebral angle tenderness.  CHEST:  Unremarkable.  HEART:  PMI not displaced or sustained.  S1 and S2 within normal limits.  No  S3, no S4, no murmurs.  ABDOMEN:  Flat, positive bowel sounds, normal frequency, pitch.  No bruits,  rebound,  guarding.  No midline pulses, no masses, no organomegaly.  SKIN:  No rash or nodules.  EXTREMITIES:  2+ pulses, no edema.   EKG:  Sinus rhythm, rate 68, axis rightward, intervals within normal limits,  no acute ST or T-wave changes.   ASSESSMENT AND PLAN:  1. Coronary artery disease.  The patient does not have any chest pain.      This appeared to be a nonobstructive circumflex lesion.  At this point,      I would continue to manage him medically, without further intervention.  2. Dyspnea.  I do not think this etiology is cardiac.  He does have a      history of lung disease.  I am going to order PFTs and a BNP level and      proceed from there.   FOLLOWUP:  We will see the patient back in about two months, or sooner, if  needed.                               Rollene Rotunda, MD, [facility]    JH/MedQ  DD:  08/11/2006  DT:  08/11/2006  Job #:  [id number]   cc:   [facility]

## 2011-07-22 ENCOUNTER — Encounter: Payer: Self-pay | Admitting: Emergency Medicine

## 2011-07-22 ENCOUNTER — Ambulatory Visit (INDEPENDENT_AMBULATORY_CARE_PROVIDER_SITE_OTHER): Payer: Self-pay | Admitting: Emergency Medicine

## 2011-07-22 VITALS — BP 104/72 | HR 62 | Temp 98.0°F | Ht 71.0 in | Wt 191.6 lb

## 2011-07-22 DIAGNOSIS — J449 Chronic obstructive pulmonary disease, unspecified: Secondary | ICD-10-CM

## 2011-07-22 MED ORDER — OMEPRAZOLE 20 MG PO CPDR: 20.0000 mg | DELAYED_RELEASE_CAPSULE | Freq: Every day | ORAL | Status: DC

## 2011-07-22 NOTE — Progress Notes (Signed)
  Subjective:    Patient ID: [name], male    DOB: [birth year], 60 y.o.   MRN: [id number]  HPI 60 yo smoker, hx CAD/stents, COPD, admitted 01/2011 for acute exacerbation in setting suspected viral pneumonitis. Current regimen is albuterol/atrovent nebs 2 -3 x a day, budesonide neds bid. He believes that his breathing has declined some since his oral steroids. His breathing is worst with bending over, with exertion.   ROV 07/22/11 -- returns for his COPD. Last time we had planned to increase duonebs to qid + pulmicort bid. He never increased the freq of the dunebs, using tid. Continues to smoke a few cig a day, goes several days without any. For the last week and a half he has had trouble with more SOB, thinks he was exposed to fuel fumes and some smoke. Getting waked up from sleep w SOB. No exacerbations since last time, no hospitalizations. He has some GERD sx, bothers him occasionally, no longer on Nexium. Occasional daytime sleepiness, no witness apneas, unsure whether he snores.      Objective:  Gen: Pleasant, well-nourished, in no distress,  normal affect  ENT: No lesions,  mouth clear,  oropharynx clear, no postnasal drip  Neck: No JVD, no TMG, no carotid bruits  Lungs: No use of accessory muscles, no dullness to percussion, clear without rales or rhonchi  Cardiovascular: RRR, heart sounds normal, no murmur or gallops, no peripheral edema  Musculoskeletal: No deformities, no cyanosis or clubbing  Neuro: alert, non focal  Skin: Warm, no lesions or rashes   Assessment & Plan:  COPD - continue duonebs + pulmicort - trial omeprazole qd x 1 month to see if we can make nocturnal sx better - raise head of bed - rov 1 month

## 2011-07-22 NOTE — Patient Instructions (Signed)
Continue your DuoNebs 4 times a day Use your Pulmicort twice a day We will start omeprazole 20mg  daily for the next month Follow up with Dr Delton Coombes in 1 month

## 2011-07-22 NOTE — Assessment & Plan Note (Signed)
-   continue duonebs + pulmicort - trial omeprazole qd x 1 month to see if we can make nocturnal sx better - raise head of bed - rov 1 month

## 2011-07-28 ENCOUNTER — Emergency Department (HOSPITAL_COMMUNITY): Payer: Medicare Other

## 2011-07-28 ENCOUNTER — Inpatient Hospital Stay (HOSPITAL_COMMUNITY)
Admission: EM | Admit: 2011-07-28 | Discharge: 2011-07-29 | DRG: 192 | Disposition: A | Discharge status: Home / Self Care | Payer: Medicare Other | Attending: Cardiology | Admitting: Cardiology

## 2011-07-28 ENCOUNTER — Encounter (HOSPITAL_COMMUNITY): Payer: Self-pay | Admitting: Radiology

## 2011-07-28 DIAGNOSIS — F17200 Nicotine dependence, unspecified, uncomplicated: Secondary | ICD-10-CM | POA: Diagnosis present

## 2011-07-28 DIAGNOSIS — Z8249 Family history of ischemic heart disease and other diseases of the circulatory system: Secondary | ICD-10-CM

## 2011-07-28 DIAGNOSIS — J209 Acute bronchitis, unspecified: Principal | ICD-10-CM | POA: Diagnosis present

## 2011-07-28 DIAGNOSIS — Z7902 Long term (current) use of antithrombotics/antiplatelets: Secondary | ICD-10-CM

## 2011-07-28 DIAGNOSIS — R0789 Other chest pain: Secondary | ICD-10-CM | POA: Diagnosis present

## 2011-07-28 DIAGNOSIS — E785 Hyperlipidemia, unspecified: Secondary | ICD-10-CM | POA: Diagnosis present

## 2011-07-28 DIAGNOSIS — Z9861 Coronary angioplasty status: Secondary | ICD-10-CM

## 2011-07-28 DIAGNOSIS — R0602 Shortness of breath: Secondary | ICD-10-CM

## 2011-07-28 DIAGNOSIS — Z7982 Long term (current) use of aspirin: Secondary | ICD-10-CM

## 2011-07-28 DIAGNOSIS — J440 Chronic obstructive pulmonary disease with acute lower respiratory infection: Principal | ICD-10-CM | POA: Diagnosis present

## 2011-07-28 DIAGNOSIS — Z79899 Other long term (current) drug therapy: Secondary | ICD-10-CM

## 2011-07-28 DIAGNOSIS — R079 Chest pain, unspecified: Secondary | ICD-10-CM

## 2011-07-28 DIAGNOSIS — I2510 Atherosclerotic heart disease of native coronary artery without angina pectoris: Secondary | ICD-10-CM | POA: Diagnosis present

## 2011-07-28 DIAGNOSIS — IMO0002 Reserved for concepts with insufficient information to code with codable children: Secondary | ICD-10-CM

## 2011-07-28 DIAGNOSIS — I10 Essential (primary) hypertension: Secondary | ICD-10-CM | POA: Diagnosis present

## 2011-07-28 DIAGNOSIS — R197 Diarrhea, unspecified: Secondary | ICD-10-CM | POA: Diagnosis present

## 2011-07-28 DIAGNOSIS — R112 Nausea with vomiting, unspecified: Secondary | ICD-10-CM | POA: Diagnosis present

## 2011-07-28 DIAGNOSIS — K219 Gastro-esophageal reflux disease without esophagitis: Secondary | ICD-10-CM | POA: Diagnosis present

## 2011-07-28 DIAGNOSIS — I252 Old myocardial infarction: Secondary | ICD-10-CM

## 2011-07-28 LAB — DIFFERENTIAL
Basophils Relative: 0 % (ref 0–1)
Eosinophils Absolute: 0.1 10*3/uL (ref 0.0–0.7)
Lymphs Abs: 1.5 10*3/uL (ref 0.7–4.0)
Monocytes Relative: 7 % (ref 3–12)
Neutro Abs: 4.4 10*3/uL (ref 1.7–7.7)
Neutrophils Relative %: 68 % (ref 43–77)

## 2011-07-28 LAB — PROTIME-INR: Prothrombin Time: 12 seconds (ref 11.6–15.2)

## 2011-07-28 LAB — COMPREHENSIVE METABOLIC PANEL
ALT: 8 U/L (ref 0–53)
AST: 11 U/L (ref 0–37)
Albumin: 3.7 g/dL (ref 3.5–5.2)
Alkaline Phosphatase: 64 U/L (ref 39–117)
Chloride: 105 mEq/L (ref 96–112)
Potassium: 3.8 mEq/L (ref 3.5–5.1)
Sodium: 142 mEq/L (ref 135–145)
Total Bilirubin: 0.2 mg/dL — ABNORMAL LOW (ref 0.3–1.2)
Total Protein: 6.5 g/dL (ref 6.0–8.3)

## 2011-07-28 LAB — CBC
Hemoglobin: 14 g/dL (ref 13.0–17.0)
MCH: 28.2 pg (ref 26.0–34.0)
Platelets: 190 10*3/uL (ref 150–400)
RBC: 4.96 MIL/uL (ref 4.22–5.81)
WBC: 6.4 10*3/uL (ref 4.0–10.5)

## 2011-07-28 LAB — TSH: TSH: 1.911 u[IU]/mL (ref 0.350–4.500)

## 2011-07-28 LAB — TROPONIN I: Troponin I: <0.3 ng/mL (ref <0.30)

## 2011-07-28 LAB — CK TOTAL AND CKMB (NOT AT ARMC)
CK, MB: 3.6 ng/mL (ref 0.3–4.0)
Relative Index: INVALID (ref 0.0–2.5)

## 2011-07-28 LAB — APTT: aPTT: 112 seconds — ABNORMAL HIGH (ref 24–37)

## 2011-07-28 LAB — POCT I-STAT TROPONIN I

## 2011-07-28 MED ORDER — IOHEXOL 350 MG/ML SOLN
80.0000 mL | Freq: Once | INTRAVENOUS | Status: AC | PRN
  Administered 2011-07-28: 80 mL via INTRAVENOUS

## 2011-07-29 ENCOUNTER — Telehealth: Payer: Self-pay | Admitting: Cardiology

## 2011-07-29 LAB — BASIC METABOLIC PANEL
CO2: 29 mEq/L (ref 19–32)
Chloride: 109 mEq/L (ref 96–112)
Glucose, Bld: 103 mg/dL — ABNORMAL HIGH (ref 70–99)
Potassium: 3.8 mEq/L (ref 3.5–5.1)
Sodium: 143 mEq/L (ref 135–145)

## 2011-07-29 LAB — CARDIAC PANEL(CRET KIN+CKTOT+MB+TROPI): Relative Index: INVALID (ref 0.0–2.5)

## 2011-07-29 LAB — LIPID PANEL
Cholesterol: 168 mg/dL (ref 0–200)
HDL: 45 mg/dL (ref >39)
LDL Cholesterol: 101 mg/dL — ABNORMAL HIGH (ref 0–99)
Total CHOL/HDL Ratio: 3.7 RATIO
Triglycerides: 109 mg/dL (ref <150)
VLDL: 22 mg/dL (ref 0–40)

## 2011-07-29 LAB — CBC
HCT: 35.9 % — ABNORMAL LOW (ref 39.0–52.0)
Hemoglobin: 12 g/dL — ABNORMAL LOW (ref 13.0–17.0)
MCHC: 33.4 g/dL (ref 30.0–36.0)
RBC: 4.24 MIL/uL (ref 4.22–5.81)
WBC: 5 10*3/uL (ref 4.0–10.5)

## 2011-07-29 NOTE — Telephone Encounter (Signed)
Pam is not in office and Bjorn Loser called to schedule a myoview post d/c and we needs an order to attach to appt please

## 2011-07-30 NOTE — Discharge Summary (Signed)
NAMEARAV, BANNISTER NO.:  [id number]  MEDICAL RECORD NO.:  [id number]  LOCATION:  2915                         FACILITY:  [facility]  PHYSICIAN:  Madolyn Frieze. Jens Som, MD, FACCDATE OF BIRTH:  May 26, 1951  DATE OF ADMISSION:  07/28/2011 DATE OF DISCHARGE:  07/29/2011                              DISCHARGE SUMMARY   PRIMARY FINAL DISCHARGE DIAGNOSES: 1. Chest pain. 2. Nausea, vomiting, and diarrhea. 3. Shortness of breath felt secondary to chronic obstructive pulmonary     disease.  SECONDARY DIAGNOSES: 1. Acute inferior myocardial infarction in July 2006 with Horizon     study stents x2 to the RCA. 2. Acute posterior myocardial infarction with PTCA and Cypher stent x2     to the circumflex and to the OM in 2008. 3. Status post catheterization in August 2011, showing LAD 70%     proximal and 70% apically, circumflex 20 and 50%, RCA 30% with     patent stents (40% stenosis in the OM at the overlap)4. Preserved left ventricular function with an EF of 55% at cath in     August 2011. 5. Hypertension. 6. Hyperlipidemia with a total cholesterol of 168, triglycerides 109,     HDL 45, LDL 101 this admission. 7. Gastroesophageal reflux disease. 8. Ongoing tobacco use. 9. Chronic obstructive pulmonary disease. 10.Status post appendectomy. 11.Intolerance to Lipitor with myalgias. 12.Family history of coronary artery disease.  TIME OF DISCHARGE:  34 minutes.  HOSPITAL COURSE:  Mr. Recendez is a 59 year old male with a history of coronary artery disease.  He had chest pain, shortness of breath, and came to the hospital where he was admitted for further evaluation and treatment.  A CT angiogram of the chest was done, which showed considerable airway thickening, possibly acute on chronic bronchitis, but no embolus.  His chest x-ray showed bibasilar atelectasis.  His cardiac enzymes were negative for MI with a lipid profile described above.  There were no other significant  abnormalities on his labs.  On July 29, 2011, his respiratory status had improved.  He had been started on a steroid taper and antibiotics.  Dr. Jens Som evaluated Mr. Mcgilvery and felt that he was stable for discharge in improved condition, to follow up as an outpatient.  DISCHARGE INSTRUCTIONS:  His activity level is to be increased gradually.  He is encouraged to stick to a low-sodium heart-healthy diet.  He is to get a stress test on August 15 at 11:45 and follow up with Dr. Antoine Poche on August 30 at 10:30.  He is to follow up with Dr. Delton Coombes and with [facility].  DISCHARGE MEDICATIONS: 1. Steroid taper, take as directed. 2. Budesonide nebulizers b.i.d. 3. Ipratropium q.i.d. inhaled. 4. Doxycycline 100 mg b.i.d. 5. Sublingual nitroglycerin p.r.n. 6. Aspirin 325 mg a day. 7. Plavix 75 mg a day. 8. Albuterol nebulizers q.i.d. 9. Zocor 20 mg a day.     Theodore Demark, PA-C   ______________________________ Madolyn Frieze. Jens Som, MD, [facility]    RB/MEDQ  D:  07/29/2011  T:  07/29/2011  Job:  [id number]  cc:   Dala Dock  Electronically Signed by Theodore Demark PA-C on 07/29/2011 02:43:34 PM Electronically Signed by Olga Millers MD  FACC on 07/30/2011 08:41:16 AM

## 2011-08-02 NOTE — H&P (Addendum)
NAME:  [name], [name] NO.:  [id number]  MEDICAL RECORD NO.:  [id number]  LOCATION:  2915                         FACILITY:  [facility]  PHYSICIAN:  Marca Ancona, MD      DATE OF BIRTH:  November 11, 1951  DATE OF ADMISSION:  07/28/2011 DATE OF DISCHARGE:                             HISTORY & PHYSICAL   PRIMARY CARDIOLOGIST:  Rollene Rotunda, MD, [facility]  PRIMARY MEDICAL DOCTOR:  [facility], although the patient has not anyone.  PULMONOLOGIST:  Leslye Peer, MD  CHIEF COMPLAINT:  Chest pain, nausea, vomiting, diarrhea, shortness of breath.  HISTORY OF PRESENT ILLNESS:  Mr. [name] is a 60 year old gentleman with history of coronary artery disease, COPD, hypertension, and hyperlipidemia with a longstanding history of tobacco abuse who presented to [facility] with complaints of shortness of breath, weakness, and chest discomfort.  He is generally felt poor for the past 2-3 days, and feeling increasingly fatigued.  He had an episode of nausea, vomiting, and diarrhea this morning at approximately 9:30 a.m. with three bouts of back-to-back diarrhea.  This made him short of breath, which does not improve with two breathing treatments.  He felt hot, diaphoretic and developed pain in his shoulder down to his left arm like a shooting, singing and burning sensation, and could he also felt in his left leg.  He states this is similar to symptoms he has had prior to admission for heart problems; however, including the one in August 2010, in which, he had a catheterization showing patent stents.  Here in the ER, he received sublingual nitroglycerin, which did provide relief and is pain free on a nitroglycerin drip.  Occasionally, he does have a tightness sensation in his chest since he looks up at the monitor and notices his O2 sat decreasing.  He is requiring more oxygen than usual today.  He had some lower extremity edema yesterday, but is now better. His EKG shows right  axis deviation with poor R-wave progression similar to prior.  Cardiac enzymes are negative x1.  He also endorses weight losses about 8-9 pounds unintentionally over the last 4 weeks.  PAST MEDICAL HISTORY: 1. Coronary artery disease.     a.     Inferior MI in July 2006 with stenting in the RCA using a      Horizon studies.     b.     Status post NSTEMI in February 2008 with placement of a drug-      eluting stent to the left circumflex.     c.     Unstable angina with placement of bare-metal stent to the      right PLA in December 2008.     d.     Patent stents by cath in August 2010 with 70% stenosis to      the LAD, did not appear to be obstructive.  Otherwise      nonobstructive disease.     e.     Nuclear stress test in February 2011 demonstrating normal      wall motion and no ischemia.  Has inferior and mid anterior      defects in the setting of normal  wall motion and LV function. 2. Normal LV function by cath with EF of 55%.  In August 2010, an EF     of 51% by nuclear study in February 2011. 3. COPD.  He uses oxygen p.r.n. mostly at night. 4. Hyperlipidemia. 5. Hypertension. 6. Tobacco. 7. GERD.  MEDICATIONS: 1. Plavix 75 mg daily. 2. Nitroglycerin sublingual. 3. Budesonide nebulizations. 4. Atrovent nebulizations. 5. Albuterol nebulizations 6. Aspirin 325 mg daily.  ALLERGIES:  LIPITOR caused muscle aches in the past.  SOCIAL HISTORY:  The patient lives in [place].  He has a long history of ongoing tobacco use.  He denies any alcohol use.  He was a former Journalist, newspaper, now on disability.  FAMILY HISTORY:  Mother had Alzheimer and passed away at an old age. Father died of lung cancer and was a smoker.  REVIEW OF SYSTEMS:  No fevers, chills.  He has felt occasionally hot. He has lost 8-9 pounds unintentionally over the last month.  He has occasional trace lower extremity edema.  He endorses occasional clear phlegm or cough.  No syncope.  He denies any overt  hematemesis, but states he did not walk.  All other systems reviewed and otherwise negative.  LABORATORY DATA:  WBC 6.4, hemoglobin 14, hematocrit 41, platelet count 190.  Sodium 142, potassium 2.8, chloride 105, CO2 28, glucose 88, BUN 9, creatinine 0.75.  Cardiac enzymes negative x1.  EKG; normal sinus rhythm with right axis deviation, poor R-wave progression with a rate of 84 beats per minute, similar to prior.  RADIOLOGIC STUDIES:  Chest x-ray showed low lung volumes with bibasilar atelectasis.  PHYSICAL EXAMINATION:  VITAL SIGNS:  Temperature 97.9, pulse 88, respirations 20, blood pressure 117/75, pulse ox 95% on 2 liters. GENERAL:  This is a pleasant white male in no acute distress. HEENT:  Normocephalic and atraumatic with extraocular movements intact. Clear sclerae.  Nares are without discharge. NECK:  Supple without carotid bruit or JVD. CARDIOVASCULAR:  Auscultation of the heart reveals distant heart sounds with regular rate and rhythm without murmurs, rubs, or gallops. LUNGS:  Have decreased breath sounds throughout with end-expiratory wheezes, very distant breath sounds. ABDOMEN:  Soft, nontender, and nondistended.  Positive bowel sounds. EXTREMITIES:  Warm, dry with trace sock line edema.  He has upper extremity ecchymosis on both arms, which he states from being on aspirin and Plavix and bumping into things.  He has 2+ pedal pulses bilaterally. NEUROLOGIC:  Neurologically, he is alert and oriented x3, responds questions appropriately with normal affect.  ASSESSMENT AND PLAN: 1. The patient was seen and examined by Dr. Shirlee Latch and myself.  This     is a 60 year old male with a history of chronic obstructive     pulmonary disease, coronary artery disease, status post     percutaneous coronary intervention in the past as well as ongoing     tobacco abuse, who presents with an episode of chest pain     accompanied by nausea, vomiting, diarrhea, and shortness of breath.      The patient has had tightness across the chest on and off all day     and also felt weak.  Lung exam reveals very distant breath sounds     and wheezes.  There may be a component of chronic obstructive     pulmonary disease. 2. Chest pain.  His EKG is okay and first set of cardiac enzymes are     negative.  His chest pain is slightly atypical at this time,  but     does remind him of prior times in the hospital.  At this time, we     will admit him to the hospital and continue the nitroglycerin and     heparin that has been started in the ER.  Aspirin and Plavix will     be continued.  We will restart his statin.  We will avoid beta-     blocker with a possible chronic obstructive pulmonary disease     exacerbation.  If he rules out, he may be a candidate for an     outpatient Myoview.  We will also check a 2-D echocardiogram. 3. Chronic obstructive pulmonary disease.  The patient wheezes     diffusely.  His symptoms may be compatible with chronic obstructive     pulmonary disease exacerbation.  We will continue his nebulizer     treatments and add prednisone and doxycycline. 4. Weight loss.  The patient has lost approximately 8-9 pounds over     the last month without trying.  Given his severe chronic obstructive pulmonary disease and long history of smoking, we will     get a chest CT to rule out a tumor.  Given his chest pain, we will     do this for combination with a CT angio to rule out pulmonary     embolism as well.  If this is normal, the patient should be     referred back to his primary care provider for other age-     appropriate cancer screening.     Ronie Spies, P.A.C.   ______________________________ Marca Ancona, MD    DD/MEDQ  D:  07/28/2011  T:  07/29/2011  Job:  [id number]  cc:   Rollene Rotunda, MD, [facility] Leslye Peer, MD  Electronically Signed by Ronie Spies  on 08/02/2011 01:16:59 PM Electronically Signed by Marca Ancona MD on 09/04/2011 10:49:05 PM

## 2011-08-04 ENCOUNTER — Encounter (HOSPITAL_COMMUNITY): Payer: Medicare Other | Admitting: Radiology

## 2011-08-12 ENCOUNTER — Ambulatory Visit: Payer: Medicare Other | Admitting: Emergency Medicine

## 2011-08-19 ENCOUNTER — Encounter: Payer: Medicare Other | Admitting: Cardiology

## 2011-08-30 ENCOUNTER — Ambulatory Visit (INDEPENDENT_AMBULATORY_CARE_PROVIDER_SITE_OTHER): Payer: Self-pay | Admitting: Emergency Medicine

## 2011-08-30 ENCOUNTER — Encounter: Payer: Self-pay | Admitting: Emergency Medicine

## 2011-08-30 VITALS — BP 138/80 | HR 81 | Temp 97.7°F | Ht 71.0 in | Wt 192.0 lb

## 2011-08-30 DIAGNOSIS — J449 Chronic obstructive pulmonary disease, unspecified: Secondary | ICD-10-CM

## 2011-08-30 NOTE — Patient Instructions (Signed)
We will perform spirometry today Continue your DuoNebs and Pulmicort as you are taking them You need to stop smoking completely. We will help you when you are ready to quit, please let us know! Continue your omeprazole daily Get the flu shot in the Fall Follow up with Dr Delton Coombes in 3 months or sooner if you have any problems.

## 2011-08-30 NOTE — Assessment & Plan Note (Addendum)
-   continue duonebs + pulmicort - spirometry today - flu shot in fall - had the pneumovax in 8/11 - ROV 3 months - need to stop smoking

## 2011-08-30 NOTE — Progress Notes (Signed)
  Subjective:    Patient ID: [name], male    DOB: Apr 23, 1951, 60 y.o.   MRN: [id number]  HPI 59 yo smoker, hx CAD/stents, COPD, admitted 01/2011 for acute exacerbation in setting suspected viral pneumonitis. Current regimen is albuterol/atrovent nebs 2 -3 x a day, budesonide neds bid. He believes that his breathing has declined some since his oral steroids. His breathing is worst with bending over, with exertion.   ROV 07/22/11 -- returns for his COPD. Last time we had planned to increase duonebs to qid + pulmicort bid. He never increased the freq of the dunebs, using tid. Continues to smoke a few cig a day, goes several days without any. For the last week and a half he has had trouble with more SOB, thinks he was exposed to fuel fumes and some smoke. Getting waked up from sleep w SOB. No exacerbations since last time, no hospitalizations. He has some GERD sx, bothers him occasionally, no longer on Nexium. Occasional daytime sleepiness, no witness apneas, unsure whether he snores.   ROV 08/30/11 -- COPD with continued smoking. Has been seen in the ED since last visit for CP, admitted for 2 days. Was planning for stress test but he has not done it yet. CT-PA was negative for PE. I added omeprazole last time to duonebs + pulmicort bid to see if we could improve nocturnal sx. He tells me that his breathing is about the same as a month ago. His nocturnal awakenings and GERD are better. His last PFT was 2006.      Objective:  Gen: Pleasant, well-nourished, in no distress,  normal affect  ENT: No lesions,  mouth clear,  oropharynx clear, no postnasal drip  Neck: No JVD, no TMG, no carotid bruits  Lungs: No use of accessory muscles, no dullness to percussion, clear without rales or rhonchi  Cardiovascular: RRR, heart sounds normal, no murmur or gallops, no peripheral edema  Musculoskeletal: No deformities, no cyanosis or clubbing  Neuro: alert, non focal  Skin: Warm, no lesions or  rashes   Assessment & Plan:

## 2011-09-16 LAB — CBC
HCT: 37.8 — ABNORMAL LOW
HCT: 42.8
HCT: 43.4
Hemoglobin: 12.9 — ABNORMAL LOW
MCHC: 33.7
MCV: 86.8
MCV: 86.8
Platelets: 159
Platelets: 164
Platelets: 177
RBC: 4.35
RDW: 14.6
RDW: 14.7
WBC: 10.8 — ABNORMAL HIGH
WBC: 6.1

## 2011-09-16 LAB — LIPID PANEL
HDL: 54
LDL Cholesterol: 162 — ABNORMAL HIGH
Total CHOL/HDL Ratio: 4.4
Triglycerides: 102
VLDL: 20

## 2011-09-16 LAB — DIFFERENTIAL
Basophils Relative: 0
Lymphocytes Relative: 10 — ABNORMAL LOW
Lymphs Abs: 0.6 — ABNORMAL LOW
Monocytes Absolute: 0.1
Monocytes Relative: 1 — ABNORMAL LOW
Neutro Abs: 5.5
Neutrophils Relative %: 88 — ABNORMAL HIGH

## 2011-09-16 LAB — CK TOTAL AND CKMB (NOT AT ARMC)
CK, MB: 3.3
Total CK: 72

## 2011-09-16 LAB — POCT CARDIAC MARKERS: Operator id: 277751

## 2011-09-16 LAB — COMPREHENSIVE METABOLIC PANEL
Albumin: 3.9
Alkaline Phosphatase: 59
BUN: 15
Calcium: 9.2
Glucose, Bld: 136 — ABNORMAL HIGH
Potassium: 4.4
Sodium: 143
Total Protein: 6.5

## 2011-09-16 LAB — APTT
aPTT: 106 — ABNORMAL HIGH
aPTT: 29

## 2011-09-16 LAB — BASIC METABOLIC PANEL
BUN: 15
Chloride: 106
Chloride: 107
Creatinine, Ser: 0.84
GFR calc Af Amer: >60
GFR calc non Af Amer: >60
GFR calc non Af Amer: >60
Glucose, Bld: 120 — ABNORMAL HIGH
Potassium: 4.2
Potassium: 4.3
Sodium: 141

## 2011-09-16 LAB — CARDIAC PANEL(CRET KIN+CKTOT+MB+TROPI)
CK, MB: 2.4
CK, MB: 2.6
Total CK: 45
Total CK: 95
Troponin I: <0.01

## 2011-09-16 LAB — TROPONIN I: Troponin I: 0.02

## 2011-09-16 LAB — HEPARIN LEVEL (UNFRACTIONATED): Heparin Unfractionated: 0.76 — ABNORMAL HIGH

## 2011-09-16 LAB — POCT I-STAT, CHEM 8
Calcium, Ion: 1.15
Glucose, Bld: 100 — ABNORMAL HIGH
HCT: 45
TCO2: 30

## 2011-09-16 LAB — PROTIME-INR: Prothrombin Time: 12.1

## 2011-09-21 LAB — CBC
HCT: 33.9 — ABNORMAL LOW
HCT: 39.9
Hemoglobin: 11.7 — ABNORMAL LOW
Hemoglobin: 13.6
MCV: 86.2
Platelets: 230
Platelets: 291
RBC: 3.93 — ABNORMAL LOW
RDW: 13.2
RDW: 13.4
WBC: 7.8

## 2011-09-21 LAB — POCT CARDIAC MARKERS
CKMB, poc: 1.2
CKMB, poc: <1 — ABNORMAL LOW
Troponin i, poc: <0.05

## 2011-09-21 LAB — CARDIAC PANEL(CRET KIN+CKTOT+MB+TROPI)
CK, MB: 2.7
CK, MB: 3.3
CK, MB: 4.4 — ABNORMAL HIGH
Relative Index: 2.5
Total CK: 96
Troponin I: 0.01

## 2011-09-21 LAB — BASIC METABOLIC PANEL
BUN: 7
CO2: 27
Chloride: 102
Chloride: 104
GFR calc non Af Amer: >60
GFR calc non Af Amer: >60
Glucose, Bld: 102 — ABNORMAL HIGH
Glucose, Bld: 157 — ABNORMAL HIGH
Potassium: 4.1
Potassium: 4.4
Sodium: 138
Sodium: 140

## 2011-09-21 LAB — LIPID PANEL
LDL Cholesterol: 89
Triglycerides: 68

## 2011-09-21 LAB — PROTIME-INR: INR: 1

## 2011-09-24 LAB — CBC
HCT: 38 — ABNORMAL LOW
HCT: 45
Hemoglobin: 15.4
MCHC: 34.2
MCHC: 34.5
MCHC: 34.5
MCV: 84.7
MCV: 84.8
Platelets: 149 — ABNORMAL LOW
Platelets: 172
RDW: 14.1
RDW: 14.3
WBC: 5.9

## 2011-09-24 LAB — COMPREHENSIVE METABOLIC PANEL
Albumin: 3.3 — ABNORMAL LOW
BUN: 8
Calcium: 8.8
Chloride: 105
Creatinine, Ser: 1.12
GFR calc Af Amer: >60
GFR calc non Af Amer: >60
Total Bilirubin: 0.8

## 2011-09-24 LAB — BASIC METABOLIC PANEL
BUN: 7
CO2: 29
Chloride: 106
Creatinine, Ser: 1

## 2011-09-24 LAB — PROTIME-INR
INR: 0.9
Prothrombin Time: 11.9

## 2011-09-24 LAB — LIPID PANEL
Cholesterol: 184
LDL Cholesterol: 130 — ABNORMAL HIGH
Total CHOL/HDL Ratio: 4.8
Triglycerides: 78

## 2011-09-24 LAB — I-STAT 8, (EC8 V) (CONVERTED LAB)
BUN: 7
Bicarbonate: 31.3 — ABNORMAL HIGH
Glucose, Bld: 86
Hemoglobin: 16
TCO2: 33
pCO2, Ven: 54.3 — ABNORMAL HIGH
pH, Ven: 7.369 — ABNORMAL HIGH

## 2011-09-24 LAB — CARDIAC PANEL(CRET KIN+CKTOT+MB+TROPI)
Relative Index: INVALID
Relative Index: INVALID
Relative Index: INVALID
Total CK: 50
Total CK: 89
Troponin I: 0.01
Troponin I: 0.02

## 2011-09-24 LAB — DIFFERENTIAL
Basophils Absolute: 0
Eosinophils Relative: 1
Lymphocytes Relative: 25
Monocytes Absolute: 0.5

## 2011-09-24 LAB — POCT CARDIAC MARKERS
CKMB, poc: 1.1
Myoglobin, poc: 63.3
Operator id: 151321

## 2011-09-24 LAB — T3: T3, Total: 92.1 (ref 80.0–204.0)

## 2011-09-24 LAB — APTT: aPTT: 31

## 2011-09-24 LAB — POCT I-STAT CREATININE: Creatinine, Ser: 1.1

## 2011-10-11 ENCOUNTER — Telehealth: Payer: Self-pay | Admitting: Emergency Medicine

## 2011-10-11 ENCOUNTER — Inpatient Hospital Stay (HOSPITAL_COMMUNITY): Payer: Medicare Other

## 2011-10-11 ENCOUNTER — Encounter: Payer: Self-pay | Admitting: Internal Medicine

## 2011-10-11 ENCOUNTER — Inpatient Hospital Stay (HOSPITAL_COMMUNITY)
Admission: AD | Admit: 2011-10-11 | Discharge: 2011-10-18 | DRG: 190 | Disposition: A | Discharge status: Home / Self Care | Payer: Medicare Other | Source: Ambulatory Visit | Attending: Pulmonary Disease | Admitting: Pulmonary Disease

## 2011-10-11 ENCOUNTER — Ambulatory Visit (INDEPENDENT_AMBULATORY_CARE_PROVIDER_SITE_OTHER): Payer: Self-pay | Admitting: Internal Medicine

## 2011-10-11 VITALS — BP 122/82 | HR 96 | Temp 97.8°F | Ht 71.0 in | Wt 187.0 lb

## 2011-10-11 DIAGNOSIS — I10 Essential (primary) hypertension: Secondary | ICD-10-CM | POA: Diagnosis present

## 2011-10-11 DIAGNOSIS — J9620 Acute and chronic respiratory failure, unspecified whether with hypoxia or hypercapnia: Secondary | ICD-10-CM | POA: Diagnosis not present

## 2011-10-11 DIAGNOSIS — F17200 Nicotine dependence, unspecified, uncomplicated: Secondary | ICD-10-CM | POA: Diagnosis present

## 2011-10-11 DIAGNOSIS — J441 Chronic obstructive pulmonary disease with (acute) exacerbation: Secondary | ICD-10-CM

## 2011-10-11 DIAGNOSIS — I2510 Atherosclerotic heart disease of native coronary artery without angina pectoris: Secondary | ICD-10-CM | POA: Diagnosis present

## 2011-10-11 DIAGNOSIS — E785 Hyperlipidemia, unspecified: Secondary | ICD-10-CM | POA: Diagnosis present

## 2011-10-11 DIAGNOSIS — Z9861 Coronary angioplasty status: Secondary | ICD-10-CM

## 2011-10-11 LAB — BASIC METABOLIC PANEL
CO2: 28 mEq/L (ref 19–32)
Glucose, Bld: 71 mg/dL (ref 70–99)
Potassium: 3.9 mEq/L (ref 3.5–5.1)
Sodium: 139 mEq/L (ref 135–145)

## 2011-10-11 LAB — BLOOD GAS, ARTERIAL
Acid-Base Excess: 2.4 mmol/L — ABNORMAL HIGH (ref 0.0–2.0)
Bicarbonate: 26.7 mEq/L — ABNORMAL HIGH (ref 20.0–24.0)
O2 Saturation: 97.5 %
Patient temperature: 98.6
TCO2: 23.4 mmol/L (ref 0–100)

## 2011-10-11 LAB — DIFFERENTIAL
Eosinophils Relative: 1 % (ref 0–5)
Lymphocytes Relative: 16 % (ref 12–46)
Lymphs Abs: 0.8 10*3/uL (ref 0.7–4.0)

## 2011-10-11 LAB — CBC
HCT: 41.2 % (ref 39.0–52.0)
MCV: 85.1 fL (ref 78.0–100.0)
RDW: 14.3 % (ref 11.5–15.5)
WBC: 5.1 10*3/uL (ref 4.0–10.5)

## 2011-10-11 LAB — TROPONIN I
Troponin I: <0.3 ng/mL (ref <0.30)
Troponin I: <0.3 ng/mL (ref <0.30)

## 2011-10-11 MED ORDER — ALBUTEROL SULFATE (2.5 MG/3ML) 0.083% IN NEBU
2.5000 mg | INHALATION_SOLUTION | Freq: Once | RESPIRATORY_TRACT | Status: AC
  Administered 2011-10-11: 2.5 mg via RESPIRATORY_TRACT

## 2011-10-11 MED ORDER — METHYLPREDNISOLONE ACETATE 80 MG/ML IJ SUSP
80.0000 mg | Freq: Once | INTRAMUSCULAR | Status: AC
Start: 2011-10-11 — End: 2011-10-11
  Administered 2011-10-11: 80 mg via INTRAMUSCULAR

## 2011-10-11 NOTE — Telephone Encounter (Signed)
LMOMTCB at # listed to call back at and ATC the # above again and phone line is still messed up. Will await pt call back

## 2011-10-11 NOTE — Progress Notes (Signed)
Addended by: Darrell Jewel on: 10/11/2011 02:49 PM   Modules accepted: Orders

## 2011-10-11 NOTE — Progress Notes (Signed)
Subjective:    Patient ID: [name], male    DOB: 09-09-1951, 60 y.o.   MRN: [id number]  HPI 60 yo smoker, hx CAD/stents, COPD, admitted 01/2011 for acute exacerbation in setting suspected viral pneumonitis. Current regimen is albuterol/atrovent nebs 2 -3 x a day, budesonide neds bid. He believes that his breathing has declined some since his oral steroids. His breathing is worst with bending over, with exertion.   ROV 07/22/11 -- returns for his COPD. Last time we had planned to increase duonebs to qid + pulmicort bid. He never increased the freq of the dunebs, using tid. Continues to smoke a few cig a day, goes several days without any. For the last week and a half he has had trouble with more SOB, thinks he was exposed to fuel fumes and some smoke. Getting waked up from sleep w SOB. No exacerbations since last time, no hospitalizations. He has some GERD sx, bothers him occasionally, no longer on Nexium. Occasional daytime sleepiness, no witness apneas, unsure whether he snores.   ROV 08/30/11 -- COPD with continued smoking. Has been seen in the ED since last visit for CP, admitted for 2 days. Was planning for stress test but he has not done it yet. CT-PA was negative for PE. I added omeprazole last time to duonebs + pulmicort bid to see if we could improve nocturnal sx. He tells me that his breathing is about the same as a month ago. His nocturnal awakenings and GERD are better. His last PFT was 2006.   OV 10/11/11: known COPD, CAD patient of Dr Delton Coombes. Acute visit today. STates since 10/08/11 Friday started with runny nose, headaches, myalgia, sore throat, fever. Since then progressively worse. Runny nose resolved yesterday but developed worsening dyspnea, chest tightness, wheezing, cough all worse. Symptoms associated with white sputum but volume is way more than usual. Rates symptoms severe at times. Currently dyspneic walking few feet (baseline can walk 00 feet). Several rescue uses of albuterol  last night only with some relief. Says pulse ox at home was low 80s. No fever currently. Denies chest pain. Hemoptysis, edema.   Of note: still smoking. Past Medical: rpeorts intubation for AECOPD in 2012.  Social, Family, Meds: no change  Past Medical History  Diagnosis Date  . COPD (chronic obstructive pulmonary disease)   . CAD (coronary artery disease)   . Hypertension   . Hyperlipidemia      Family History  Problem Relation Age of Onset  . Coronary artery disease    . Hypertension       History   Social History  . Marital Status: Married    Spouse Name: N/A    Number of Children: N/A  . Years of Education: N/A   Occupational History  . Disabled    Social History Main Topics  . Smoking status: Current Some Day Smoker -- 0.3 packs/day for 40 years  . Smokeless tobacco: Not on file   Comment: approx 3-4 cigarettes per week  . Alcohol Use: No  . Drug Use: No  . Sexually Active: Not on file   Other Topics Concern  . Not on file   Social History Narrative  . No narrative on file     Allergies  Allergen Reactions  . Lipitor (Atorvastatin Calcium)     Muscle aches     Outpatient Prescriptions Prior to Visit  Medication Sig Dispense Refill  . acetaminophen (TYLENOL) 325 MG tablet Take 650 mg by mouth every 6 (six) hours  as needed.        Marland Kitchen albuterol (VENTOLIN HFA) 108 (90 BASE) MCG/ACT inhaler Inhale 2 puffs into the lungs every 4 (four) hours as needed.       Marland Kitchen aspirin 325 MG tablet Take 325 mg by mouth daily.        . budesonide (PULMICORT) 0.25 MG/2ML nebulizer solution Take 0.25 mg by nebulization 2 (two) times daily.        . clopidogrel (PLAVIX) 75 MG tablet Take 75 mg by mouth daily.        . furosemide (LASIX) 20 MG tablet Take 20 mg by mouth daily.        Marland Kitchen guaiFENesin (MUCINEX) 600 MG 12 hr tablet Take 1,200 mg by mouth 2 (two) times daily.       Marland Kitchen ipratropium (ATROVENT) 0.02 % nebulizer solution Take 2.5 mLs (500 mcg total) by nebulization 4 (four)  times daily.  75 mL  11  . nitroGLYCERIN (NITROSTAT) 0.4 MG SL tablet Place 0.4 mg under the tongue every 5 (five) minutes as needed.        Marland Kitchen omeprazole (PRILOSEC) 20 MG capsule Take 1 capsule (20 mg total) by mouth daily.  30 capsule  3  . pravastatin (PRAVACHOL) 40 MG tablet Take 40 mg by mouth daily.        Marland Kitchen albuterol (PROVENTIL) (2.5 MG/3ML) 0.083% nebulizer solution Take 3 mLs (2.5 mg total) by nebulization every 6 (six) hours as needed for wheezing.  75 mL  12        Review of Systems  Constitutional: Negative for fever and unexpected weight change.  HENT: Positive for congestion and postnasal drip. Negative for ear pain, nosebleeds, sore throat, rhinorrhea, sneezing, trouble swallowing, dental problem and sinus pressure.   Eyes: Negative for redness and itching.  Respiratory: Positive for cough, chest tightness, shortness of breath and wheezing.   Cardiovascular: Negative for palpitations and leg swelling.  Gastrointestinal: Negative for nausea and vomiting.  Genitourinary: Negative for dysuria.  Musculoskeletal: Negative for joint swelling.  Skin: Negative for rash.  Neurological: Negative for headaches.  Hematological: Does not bruise/bleed easily.  Psychiatric/Behavioral: Negative for dysphoric mood. The patient is not nervous/anxious.        Objective:   Physical Exam  Nursing note and vitals reviewed. Constitutional: He is oriented to person, place, and time. He appears well-developed and well-nourished. He appears distressed.  HENT:  Head: Normocephalic and atraumatic.  Right Ear: External ear normal.  Left Ear: External ear normal.  Mouth/Throat: Oropharynx is clear and moist. No oropharyngeal exudate.  Eyes: Conjunctivae and EOM are normal. Pupils are equal, round, and reactive to light. Right eye exhibits no discharge. Left eye exhibits no discharge. No scleral icterus.  Neck: Normal range of motion. Neck supple. No JVD present. No tracheal deviation present. No  thyromegaly present.  Cardiovascular: Normal rate, regular rhythm and intact distal pulses.  Exam reveals no gallop and no friction rub.   No murmur heard. Pulmonary/Chest: Effort normal. No respiratory distress. He has wheezes. He has no rales. He exhibits no tenderness.       Leaning forward Audible wheeze Class 3-4 dyspnea Bilateral extensive wheeze Mildly tachypneic Mild difficulty with sentences  Abdominal: Soft. Bowel sounds are normal. He exhibits no distension and no mass. There is no tenderness. There is no rebound and no guarding.  Musculoskeletal: Normal range of motion. He exhibits no edema and no tenderness.  Lymphadenopathy:    He has no cervical adenopathy.  Neurological:  He is alert and oriented to person, place, and time. He has normal reflexes. No cranial nerve deficit. Coordination normal.  Skin: Skin is warm and dry. No rash noted. He is not diaphoretic. No erythema. No pallor.  Psychiatric: He has a normal mood and affect. His behavior is normal. Judgment and thought content normal.       Assessment & Plan:

## 2011-10-11 NOTE — Telephone Encounter (Signed)
I spoke with pt and he c/o runny nose, increase SOB, chest congestion, feels very achy. Pt requested to be seen today. Pt is coming in to see MR today at 1:30 since rb is not in the office. Nothing further was needed

## 2011-10-11 NOTE — Telephone Encounter (Signed)
Pt returned phone call & requests to be called back at [phone].  [name]

## 2011-10-11 NOTE — Telephone Encounter (Signed)
ATC pt twice and something was wrong with pt's phone. He keep going in and out and then it would be silent and I could not hear anything. WCB

## 2011-10-11 NOTE — Telephone Encounter (Addendum)
Pt's wife has now returned triage's call & stated the correct number to be reached at is  [phone].  I advised to take pt to ED if necessary.  Antionette Fairy

## 2011-10-11 NOTE — Assessment & Plan Note (Signed)
Admit tele Stat neb and depot medrol in office Labs abg cxr  He is willing for admission: son is here to drive him

## 2011-10-12 DIAGNOSIS — J9600 Acute respiratory failure, unspecified whether with hypoxia or hypercapnia: Secondary | ICD-10-CM

## 2011-10-12 DIAGNOSIS — J441 Chronic obstructive pulmonary disease with (acute) exacerbation: Secondary | ICD-10-CM

## 2011-10-12 LAB — CK: Total CK: 155 U/L (ref 7–232)

## 2011-10-12 LAB — LIPID PANEL
Cholesterol: 203 mg/dL — ABNORMAL HIGH (ref 0–200)
Total CHOL/HDL Ratio: 3.4 RATIO
VLDL: 13 mg/dL (ref 0–40)

## 2011-10-12 LAB — PROTIME-INR
INR: 0.89 (ref 0.00–1.49)
Prothrombin Time: 12.2 seconds (ref 11.6–15.2)

## 2011-10-12 LAB — TROPONIN I: Troponin I: <0.3 ng/mL (ref <0.30)

## 2011-10-14 DIAGNOSIS — J9600 Acute respiratory failure, unspecified whether with hypoxia or hypercapnia: Secondary | ICD-10-CM

## 2011-10-15 ENCOUNTER — Encounter: Payer: Self-pay | Admitting: Internal Medicine

## 2011-10-17 DIAGNOSIS — J9600 Acute respiratory failure, unspecified whether with hypoxia or hypercapnia: Secondary | ICD-10-CM

## 2011-10-17 DIAGNOSIS — J441 Chronic obstructive pulmonary disease with (acute) exacerbation: Secondary | ICD-10-CM

## 2011-10-18 DIAGNOSIS — J9600 Acute respiratory failure, unspecified whether with hypoxia or hypercapnia: Secondary | ICD-10-CM

## 2011-10-18 DIAGNOSIS — J441 Chronic obstructive pulmonary disease with (acute) exacerbation: Secondary | ICD-10-CM

## 2011-10-22 ENCOUNTER — Ambulatory Visit (INDEPENDENT_AMBULATORY_CARE_PROVIDER_SITE_OTHER): Payer: Medicare Other | Admitting: Adult Health

## 2011-10-22 ENCOUNTER — Encounter: Payer: Self-pay | Admitting: Adult Health

## 2011-10-22 VITALS — BP 120/78 | HR 82 | Temp 97.6°F | Ht 71.0 in | Wt 191.0 lb

## 2011-10-22 DIAGNOSIS — J449 Chronic obstructive pulmonary disease, unspecified: Secondary | ICD-10-CM

## 2011-10-22 NOTE — Progress Notes (Signed)
Subjective:    Patient ID: [name], male    DOB: 07-22-1951, 60 y.o.   MRN: [id number]  HPI 60 yo smoker, hx CAD/stents, COPD, admitted 01/2011 for acute exacerbation in setting suspected viral pneumonitis. Current regimen is albuterol/atrovent nebs 2 -3 x a day, budesonide neds bid. He believes that his breathing has declined some since his oral steroids. His breathing is worst with bending over, with exertion.   ROV 07/22/11 -- returns for his COPD. Last time we had planned to increase duonebs to qid + pulmicort bid. He never increased the freq of the dunebs, using tid. Continues to smoke a few cig a day, goes several days without any. For the last week and a half he has had trouble with more SOB, thinks he was exposed to fuel fumes and some smoke. Getting waked up from sleep w SOB. No exacerbations since last time, no hospitalizations. He has some GERD sx, bothers him occasionally, no longer on Nexium. Occasional daytime sleepiness, no witness apneas, unsure whether he snores.   ROV 08/30/11 -- COPD with continued smoking. Has been seen in the ED since last visit for CP, admitted for 2 days. Was planning for stress test but he has not done it yet. CT-PA was negative for PE. I added omeprazole last time to duonebs + pulmicort bid to see if we could improve nocturnal sx. He tells me that his breathing is about the same as a month ago. His nocturnal awakenings and GERD are better. His last PFT was 2006.   10/22/2011 Acute OV  Pt presents for follow up of hospital admission for COPD exacerbation . No discharge summary available at today's visit. Patient was admitted, treated with IV antibiotics, steroids, and nebulizer treatments. Patient says he is slowly improving with decreased cough, congestion, and wheezing. He still has on 3 days left of prednisone and antibiotic therapy. He denies any hemoptysis, chest pain, abdominal pain, leg swelling, or edema. He remains on Pulmicort nebulizer twice daily and  Atrovent nebulizer 4 times daily. Unfortunately, he has not stopped smoking. We discussed in detail smoking cessation.   ROS:  Constitutional:   No  weight loss, night sweats,  Fevers, chills, + fatigue, or  lassitude.  HEENT:   No headaches,  Difficulty swallowing,  Tooth/dental problems, or  Sore throat,                No sneezing, itching, ear ache, nasal congestion, post nasal drip,   CV:  No chest pain,  Orthopnea, PND, swelling in lower extremities, anasarca, dizziness, palpitations, syncope.   GI  No heartburn, indigestion, abdominal pain, nausea, vomiting, diarrhea, change in bowel habits, loss of appetite, bloody stools.   Resp:   No coughing up of blood.Marland Kitchen  No chest wall deformity  Skin: no rash or lesions.  GU: no dysuria, change in color of urine, no urgency or frequency.  No flank pain, no hematuria   MS:  No joint pain or swelling.  No decreased range of motion.    Psych:  No change in mood or affect. No depression or anxiety.  No memory loss.         Objective:  Gen: Pleasant, in no distress,  normal affect  ENT: No lesions,  mouth clear,  oropharynx clear, no postnasal drip  Neck: No JVD, no TMG, no carotid bruits  Lungs: Coarse BS w/o use of accessory muscles, no dullness to percussion   Cardiovascular: RRR, heart sounds normal, no murmur or gallops,  no peripheral edema  Musculoskeletal: No deformities, no cyanosis or clubbing  Neuro: alert, non focal  Skin: Warm, no lesions or rashes   Assessment & Plan:

## 2011-10-22 NOTE — Assessment & Plan Note (Signed)
Recent flare with hospitalization   Plan  Finish prednisone and antibiotics as directed. Most important goal is to quit smoking. Continue on your current nebulizer with Pulmicort and Atrovent. Followup with Dr. Delton Coombes in 3-4 weeks and as needed. Please contact office for sooner follow up if symptoms do not improve or worsen or seek emergency care

## 2011-10-22 NOTE — Patient Instructions (Addendum)
Finish prednisone and antibiotics as directed. Most important goal is to quit smoking. Continue on your current nebulizer with Pulmicort and Atrovent. Followup with Dr. Delton Coombes in 3-4 weeks and as needed. Please contact office for sooner follow up if symptoms do not improve or worsen or seek emergency care

## 2011-10-25 NOTE — Discharge Summary (Signed)
NAMEDAWSON, [name] NO.:  [id number]  MEDICAL RECORD NO.:  [id number]  LOCATION:  1430                         FACILITY:  [facility]  PHYSICIAN:  Charlaine Dalton. Sherene Sires, MD, FCCPDATE OF BIRTH:  1951/09/14  DATE OF ADMISSION:  10/11/2011 DATE OF DISCHARGE:  10/18/2011                              DISCHARGE SUMMARY   DISCHARGE DIAGNOSIS:  Consist of acute exacerbation of chronic obstructive pulmonary disease with reaching a maximal hospital benefit by date of discharge.  Noted, he has near end-stage chronic obstructive pulmonary disease.  HISTORY OF PRESENT ILLNESS:  Mr. Querry is a 60 year old white male with a known history of COPD.  He is intermittently O2 dependent.  He was admitted for increasing shortness of breath, and for further evaluation and treatment by Dr. Nyoka Cowden.  LAB DATA:  WBCs 5.1, hemoglobin 14.0, hematocrit 41.2, platelets are 151.  2 L nasal cannula.  His pH 7.419, pCO2 of 44, PO2 of 88 with a bicarb of 26.  Chest x-ray shows emphysema with acute cardiopulmonary findings.  HOSPITAL COURSE BY DISCHARGE DIAGNOSIS:  Acute exacerbation of chronic obstructive pulmonary disease.  He was admitted to [facility], treated with pharmaceutical interventions of steroids and antibiotics. He walked on the unit and found to have O2 sats of 88% on room air with ambulation.  He reached maximal hospital benefit and was discharged home.  DISCHARGE MEDICATIONS:  Include: 1. Zithromax 250 mg daily until complete. 2. Prednisone on taper 40 mg 2 times a day for 3 days, then 4 tablets     a day times 3 days, then 3 tablets a day times 3 days, 2 tablets a     day times 3 days, 1 tablet a day times 3 days and then stop. 3. Albuterol nebulizers 2.5 four times daily. 4. Aspirin 325 mg daily. 5. Budesonide 0.5 mg twice daily inhaler. 6. Atrovent 0.5 mg inhaled 4 times daily. 7. Nitroglycerin 0.4 mg sublingual every 5 minutes as needed. 8. Plavix 75 mg  daily. 9. Prilosec 20 mg daily. 10.Simvastatin 20 mg daily. 11.Tylenol 325 mg 2 tablets every 8 hours as needed. He has a followup appointment with Rubye Oaks as an outpatient.  DISPOSITION/CONDITION ON DISCHARGE:  Improved.     Devra Dopp, MSN, ACNP   ______________________________ Charlaine Dalton. Sherene Sires, MD, FCCP    SM/MEDQ  D:  10/25/2011  T:  10/25/2011  Job:  [id number]

## 2011-11-22 ENCOUNTER — Encounter: Payer: Self-pay | Admitting: Emergency Medicine

## 2011-11-22 ENCOUNTER — Ambulatory Visit (INDEPENDENT_AMBULATORY_CARE_PROVIDER_SITE_OTHER): Payer: Medicare Other | Admitting: Emergency Medicine

## 2011-11-22 DIAGNOSIS — J449 Chronic obstructive pulmonary disease, unspecified: Secondary | ICD-10-CM

## 2011-11-22 NOTE — Patient Instructions (Signed)
Please continue your nebulized medications as you are taking them Follow with Dr Delton Coombes in 4 months or sooner if you have any problems.  Congratulations on decreasing your cigarettes. Continue to work on stopping completely

## 2011-11-22 NOTE — Progress Notes (Signed)
  Subjective:    Patient ID: [name], male    DOB: 05-13-1951, 60 y.o.   MRN: [id number]  HPI 59 yo smoker, hx CAD/stents, COPD, admitted 01/2011 for acute exacerbation in setting suspected viral pneumonitis. Current regimen is albuterol/atrovent nebs 2 -3 x a day, budesonide neds bid. He believes that his breathing has declined some since his oral steroids. His breathing is worst with bending over, with exertion.   ROV 07/22/11 -- returns for his COPD. Last time we had planned to increase duonebs to qid + pulmicort bid. He never increased the freq of the dunebs, using tid. Continues to smoke a few cig a day, goes several days without any. For the last week and a half he has had trouble with more SOB, thinks he was exposed to fuel fumes and some smoke. Getting waked up from sleep w SOB. No exacerbations since last time, no hospitalizations. He has some GERD sx, bothers him occasionally, no longer on Nexium. Occasional daytime sleepiness, no witness apneas, unsure whether he snores.   ROV 08/30/11 -- COPD with continued smoking. Has been seen in the ED since last visit for CP, admitted for 2 days. Was planning for stress test but he has not done it yet. CT-PA was negative for PE. I added omeprazole last time to duonebs + pulmicort bid to see if we could improve nocturnal sx. He tells me that his breathing is about the same as a month ago. His nocturnal awakenings and GERD are better. His last PFT was 2006.   10/22/11 -- Pt presents for follow up of hospital admission for COPD exacerbation . No discharge summary available at today's visit. Patient was admitted, treated with IV antibiotics, steroids, and nebulizer treatments. Patient says he is slowly improving with decreased cough, congestion, and wheezing. He still has on 3 days left of prednisone and antibiotic therapy. He denies any hemoptysis, chest pain, abdominal pain, leg swelling, or edema. He remains on Pulmicort nebulizer twice daily and Atrovent  nebulizer 4 times daily. Unfortunately, he has not stopped smoking. We discussed in detail smoking cessation.  ROV 11/22/11 -- COPD, continued tobacco, last seen 11/2. He is having a bit more wheezing and mucous, notices that this happens when he comes of steroids. Mucous clear to yellow. [name] has seen blood. Overall he feels he is doing fairly well. Taking duonebs qid, pulmicort bid. Smoking has decreased significantly, sometimes several days between cigarettes. No flares since last time.     Objective:  Gen: Pleasant, in no distress,  normal affect  ENT: No lesions,  mouth clear,  oropharynx clear, no postnasal drip  Neck: No JVD, no TMG, no carotid bruits  Lungs: Coarse BS w/o use of accessory muscles, no dullness to percussion   Cardiovascular: RRR, heart sounds normal, no murmur or gallops, no peripheral edema  Musculoskeletal: No deformities, no cyanosis or clubbing  Neuro: alert, non focal  Skin: Warm, no lesions or rashes   Assessment & Plan:   COPD Same nebs rov 4 mon Discussed cessation

## 2011-11-22 NOTE — Assessment & Plan Note (Signed)
Same nebs rov 4 mon Discussed cessation

## 2011-12-24 ENCOUNTER — Telehealth: Payer: Self-pay | Admitting: Emergency Medicine

## 2011-12-24 ENCOUNTER — Other Ambulatory Visit: Payer: Self-pay | Admitting: Emergency Medicine

## 2011-12-24 NOTE — Telephone Encounter (Signed)
Spoke with patient requested r

## 2011-12-24 NOTE — Telephone Encounter (Signed)
Spoke with patient aware that albuterol neb sol. Was called in earlier.

## 2012-01-12 ENCOUNTER — Emergency Department (HOSPITAL_COMMUNITY): Payer: Medicare Other

## 2012-01-12 ENCOUNTER — Telehealth: Payer: Self-pay | Admitting: Emergency Medicine

## 2012-01-12 ENCOUNTER — Encounter (HOSPITAL_COMMUNITY): Payer: Self-pay | Admitting: Emergency Medicine

## 2012-01-12 ENCOUNTER — Other Ambulatory Visit: Payer: Self-pay

## 2012-01-12 ENCOUNTER — Emergency Department (HOSPITAL_COMMUNITY)
Admission: EM | Admit: 2012-01-12 | Discharge: 2012-01-12 | Disposition: A | Discharge status: Home / Self Care | Payer: Medicare Other | Attending: Emergency Medicine | Admitting: Emergency Medicine

## 2012-01-12 DIAGNOSIS — E785 Hyperlipidemia, unspecified: Secondary | ICD-10-CM | POA: Insufficient documentation

## 2012-01-12 DIAGNOSIS — R079 Chest pain, unspecified: Secondary | ICD-10-CM | POA: Insufficient documentation

## 2012-01-12 DIAGNOSIS — J441 Chronic obstructive pulmonary disease with (acute) exacerbation: Secondary | ICD-10-CM | POA: Insufficient documentation

## 2012-01-12 DIAGNOSIS — M549 Dorsalgia, unspecified: Secondary | ICD-10-CM | POA: Insufficient documentation

## 2012-01-12 DIAGNOSIS — I10 Essential (primary) hypertension: Secondary | ICD-10-CM | POA: Insufficient documentation

## 2012-01-12 DIAGNOSIS — I2510 Atherosclerotic heart disease of native coronary artery without angina pectoris: Secondary | ICD-10-CM | POA: Insufficient documentation

## 2012-01-12 DIAGNOSIS — Z79899 Other long term (current) drug therapy: Secondary | ICD-10-CM | POA: Insufficient documentation

## 2012-01-12 DIAGNOSIS — F17200 Nicotine dependence, unspecified, uncomplicated: Secondary | ICD-10-CM | POA: Insufficient documentation

## 2012-01-12 LAB — COMPREHENSIVE METABOLIC PANEL
Alkaline Phosphatase: 65 U/L (ref 39–117)
BUN: 13 mg/dL (ref 6–23)
CO2: 27 mEq/L (ref 19–32)
Calcium: 9.2 mg/dL (ref 8.4–10.5)
GFR calc Af Amer: 85 mL/min — ABNORMAL LOW (ref >90)
GFR calc non Af Amer: 73 mL/min — ABNORMAL LOW (ref >90)
Glucose, Bld: 88 mg/dL (ref 70–99)
Total Protein: 6.7 g/dL (ref 6.0–8.3)

## 2012-01-12 LAB — LIPASE, BLOOD: Lipase: 22 U/L (ref 11–59)

## 2012-01-12 LAB — CBC
Hemoglobin: 14.1 g/dL (ref 13.0–17.0)
MCH: 29.6 pg (ref 26.0–34.0)
MCHC: 35.1 g/dL (ref 30.0–36.0)
MCV: 84.3 fL (ref 78.0–100.0)

## 2012-01-12 MED ORDER — METHYLPREDNISOLONE SODIUM SUCC 125 MG IJ SOLR
125.0000 mg | INTRAMUSCULAR | Status: AC
  Administered 2012-01-12: 125 mg via INTRAVENOUS
  Filled 2012-01-12: qty 2

## 2012-01-12 MED ORDER — AZITHROMYCIN 250 MG PO TABS: 250.0000 mg | ORAL_TABLET | Freq: Every day | ORAL | Status: AC

## 2012-01-12 MED ORDER — TRAMADOL HCL 50 MG PO TABS
50.0000 mg | ORAL_TABLET | Freq: Once | ORAL | Status: AC
  Administered 2012-01-12: 50 mg via ORAL
  Filled 2012-01-12: qty 1

## 2012-01-12 MED ORDER — CLOPIDOGREL BISULFATE 75 MG PO TABS
75.0000 mg | ORAL_TABLET | Freq: Once | ORAL | Status: AC
  Administered 2012-01-12: 75 mg via ORAL
  Filled 2012-01-12: qty 1

## 2012-01-12 MED ORDER — ALBUTEROL SULFATE (5 MG/ML) 0.5% IN NEBU
2.5000 mg | INHALATION_SOLUTION | RESPIRATORY_TRACT | Status: AC
  Administered 2012-01-12: 2.5 mg via RESPIRATORY_TRACT
  Filled 2012-01-12: qty 0.5

## 2012-01-12 MED ORDER — TRAMADOL HCL 50 MG PO TABS
50.0000 mg | ORAL_TABLET | Freq: Four times a day (QID) | ORAL | Status: AC | PRN
Start: 2012-01-12 — End: 2012-01-22

## 2012-01-12 MED ORDER — KETOROLAC TROMETHAMINE 30 MG/ML IJ SOLN
30.0000 mg | Freq: Once | INTRAMUSCULAR | Status: AC
  Administered 2012-01-12: 30 mg via INTRAVENOUS
  Filled 2012-01-12: qty 1

## 2012-01-12 MED ORDER — CLOPIDOGREL BISULFATE 75 MG PO TABS: 75.0000 mg | ORAL_TABLET | Freq: Every day | ORAL | Status: DC

## 2012-01-12 MED ORDER — AZITHROMYCIN 250 MG PO TABS
500.0000 mg | ORAL_TABLET | Freq: Once | ORAL | Status: AC
  Administered 2012-01-12: 500 mg via ORAL
  Filled 2012-01-12: qty 2

## 2012-01-12 MED ORDER — PREDNISONE 10 MG PO TABS: 20.0000 mg | ORAL_TABLET | Freq: Every day | ORAL | Status: AC

## 2012-01-12 NOTE — Telephone Encounter (Signed)
Pt's wife calling again in reference to previous message says he really needs to be seen today can be reached at [phone].Raylene Everts

## 2012-01-12 NOTE — ED Provider Notes (Signed)
History     CSN: [id number]  Arrival date & time 01/12/12  1859   First MD Initiated Contact with Patient 01/12/12 1938      Chief Complaint  Patient presents with  . Chest Pain    Right Ribs    (Consider location/radiation/quality/duration/timing/severity/associated sxs/prior treatment) HPI The patient presents with left-sided chest pain he notes the pain began gradually approximately 4 days ago since onset there is a soreness about the left inferior chest pain seems worse with inspiration, not with exertion the pain is otherwise nonradiating the patient notes mild dyspnea, minimally worse than his baseline.  He also describes mild cough he denies any disorientation, fevers, chills, abdominal pain, nausea, vomiting He states that he has not been taking his Plavix, nor any other medications recently Past Medical History  Diagnosis Date  . COPD (chronic obstructive pulmonary disease)   . CAD (coronary artery disease)   . Hypertension   . Hyperlipidemia     Past Surgical History  Procedure Date  . Appendectomy     Family History  Problem Relation Age of Onset  . Coronary artery disease    . Hypertension      History  Substance Use Topics  . Smoking status: Current Some Day Smoker -- 0.3 packs/day for 40 years  . Smokeless tobacco: Not on file   Comment: approx 3-4 cigarettes per week  . Alcohol Use: No      Review of Systems  Constitutional:       Per HPI, otherwise negative  HENT:       Per HPI, otherwise negative  Eyes: Negative.   Respiratory:       Per HPI, otherwise negative  Cardiovascular:       Per HPI, otherwise negative  Gastrointestinal: Negative for vomiting.  Genitourinary: Negative.   Musculoskeletal:       Per HPI, otherwise negative  Skin: Negative.   Neurological: Negative for syncope.    Allergies  Lipitor  Home Medications   Current Outpatient Rx  Name Route Sig Dispense Refill  . ACETAMINOPHEN 500 MG PO TABS Oral Take  500-1,000 mg by mouth every 6 (six) hours as needed. For pain.    . ALBUTEROL SULFATE (2.5 MG/3ML) 0.083% IN NEBU Nebulization Take 3 mLs (2.5 mg total) by nebulization every 6 (six) hours as needed for wheezing (DX:  496). 360 mL 3  . ALBUTEROL SULFATE HFA 108 (90 BASE) MCG/ACT IN AERS Inhalation Inhale 2 puffs into the lungs every 4 (four) hours as needed. For shortness of breath.    . ASPIRIN 325 MG PO TABS Oral Take 325 mg by mouth daily.      . BUDESONIDE 0.25 MG/2ML IN SUSP Nebulization Take 0.25 mg by nebulization 2 (two) times daily.      Marland Kitchen CLOPIDOGREL BISULFATE 75 MG PO TABS Oral Take 75 mg by mouth daily.      . FUROSEMIDE 20 MG PO TABS Oral Take 20 mg by mouth daily.      . IPRATROPIUM BROMIDE 0.02 % IN SOLN Nebulization Take 2.5 mLs (500 mcg total) by nebulization 4 (four) times daily. 75 mL 11  . NITROGLYCERIN 0.4 MG SL SUBL Sublingual Place 0.4 mg under the tongue every 5 (five) minutes x 3 doses as needed. For chest pain.    Marland Kitchen OMEPRAZOLE 20 MG PO CPDR Oral Take 20 mg by mouth daily as needed. For acid reflux.    Marland Kitchen PRAVASTATIN SODIUM 40 MG PO TABS Oral Take 40 mg by  mouth daily.        BP 144/95  Pulse 58  Temp(Src) 98.2 F (36.8 C) (Oral)  Resp 20  Wt 200 lb (90.719 kg)  SpO2 99%  Physical Exam  Nursing note and vitals reviewed. Constitutional: He is oriented to person, place, and time. He appears well-developed. No distress.  HENT:  Head: Normocephalic and atraumatic.  Eyes: Conjunctivae and EOM are normal.  Cardiovascular: Normal rate and regular rhythm.   Pulmonary/Chest: Effort normal. No stridor. No respiratory distress. He has wheezes.  Abdominal: He exhibits no distension.  Musculoskeletal: He exhibits no edema.  Neurological: He is alert and oriented to person, place, and time.  Skin: Skin is warm and dry.  Psychiatric: He has a normal mood and affect.    ED Course  Procedures (including critical care time)  Labs Reviewed  COMPREHENSIVE METABOLIC PANEL -  Abnormal; Notable for the following:    Total Bilirubin 0.1 (*)    GFR calc non Af Amer 73 (*)    GFR calc Af Amer 85 (*)    All other components within normal limits  CBC  LIPASE, BLOOD  TROPONIN I   Dg Chest 2 View  01/12/2012  *RADIOLOGY REPORT*  Clinical Data: Shortness of breath.  CHEST - 2 VIEW  Comparison: Plain films the chest 10/11/2011.  Findings: The chest is hyperexpanded.  Linear opacity in the left lung base is compatible with atelectasis.  Right lung is clear. Heart size is normal.  IMPRESSION: Hyperexpansion compatible with emphysema.  Mild left basilar atelectasis noted.  Original Report Authenticated By: Bernadene Bell. Maricela Curet, M.D.     No diagnosis found.  Pulse Ox 99% 2l [place] - abnormal Cardiac: 81 sr -normal   Date: 01/12/2012  Rate: 80  Rhythm: normal sinus rhythm  QRS Axis: right  Intervals: normal  ST/T Wave abnormalities: normal  Conduction Disutrbances:none  Narrative Interpretation:   Old EKG Reviewed: unchanged  CXR, reviewed by me, emphysematous  MDM  This 61 year old male presents with several days of chest pain on exam he is in no distress, with minimal supplemental oxygen the patient is not hypoxic the patient has a history of COPD, and given his description of medication noncompliance this episode is most consistent with COPD exacerbation the patient's ECG and chest x-ray was consistent with pulmonary disease the patient will be provided antibiotics, steroids, and follow up with his primary care physician        Gerhard Munch, MD 01/12/12 2230

## 2012-01-12 NOTE — ED Notes (Signed)
MD at bedside. 

## 2012-01-12 NOTE — Telephone Encounter (Signed)
Spoke with pt. He is c/o pain in rib area and chest- worsens with deep inspiration. He states having more SOB than usual for him the past couple days. I advised needs ov and scheduled him to see TP tomorrow am (first available) opening, and advised him to seek emergency care sooner if his symptoms persist/worsen. Pt verbalized understanding of this.

## 2012-01-12 NOTE — ED Notes (Signed)
Pt alert, presents with daughter, c/o chest pain, sob, onset was a few days ago, resp even unlabored, skin pwd, denies n/v, states pain is sharp, radiated to back

## 2012-01-13 ENCOUNTER — Ambulatory Visit: Payer: Medicare Other | Admitting: Adult Health

## 2012-01-13 ENCOUNTER — Telehealth: Payer: Self-pay | Admitting: Emergency Medicine

## 2012-01-13 NOTE — Telephone Encounter (Signed)
I spoke with pt and he states he went to ED last night for the pain he was having in his ribs and back when he would breathe. Pt states they told him they could not fine anything wrong and was giving pain pills. Pt is requesting to have RB review his CXR. Please advise Dr. Delton Coombes, thanks

## 2012-01-19 NOTE — Telephone Encounter (Signed)
Pt called back again. Today he c/o "what feels like shingles" since last Thursday. Blisters on sides / back but still lung pain. Call him at [phone] until 2pm then call [phone]. Hazel Sams

## 2012-01-19 NOTE — Telephone Encounter (Signed)
Spoke with patient-aware that RB is out of office until tomorrow-will send message back regarding CXR review from ER; however patient understands to call his PCP about possible [place] outbreak as RB doesn't treat for that. Pt will call PCP today to be seen.

## 2012-01-20 NOTE — Telephone Encounter (Signed)
Attempted to return pt's call, called all 3 numbers in the system with no answer. I agree with him going to be seen for shingles. CXR is clear and shingels was the problem all along. I'll try to call him again 2/1.

## 2012-01-21 ENCOUNTER — Emergency Department (HOSPITAL_COMMUNITY)
Admission: EM | Admit: 2012-01-21 | Discharge: 2012-01-21 | Disposition: A | Discharge status: Home / Self Care | Payer: Medicare Other | Attending: Emergency Medicine | Admitting: Emergency Medicine

## 2012-01-21 DIAGNOSIS — I2510 Atherosclerotic heart disease of native coronary artery without angina pectoris: Secondary | ICD-10-CM | POA: Insufficient documentation

## 2012-01-21 DIAGNOSIS — R062 Wheezing: Secondary | ICD-10-CM | POA: Insufficient documentation

## 2012-01-21 DIAGNOSIS — R0602 Shortness of breath: Secondary | ICD-10-CM | POA: Insufficient documentation

## 2012-01-21 DIAGNOSIS — R079 Chest pain, unspecified: Secondary | ICD-10-CM | POA: Insufficient documentation

## 2012-01-21 DIAGNOSIS — B029 Zoster without complications: Secondary | ICD-10-CM | POA: Insufficient documentation

## 2012-01-21 DIAGNOSIS — I10 Essential (primary) hypertension: Secondary | ICD-10-CM | POA: Insufficient documentation

## 2012-01-21 DIAGNOSIS — R21 Rash and other nonspecific skin eruption: Secondary | ICD-10-CM | POA: Insufficient documentation

## 2012-01-21 MED ORDER — OXYCODONE-ACETAMINOPHEN 5-325 MG PO TABS: 1.0000 | ORAL_TABLET | ORAL | Status: DC | PRN

## 2012-01-21 MED ORDER — OXYCODONE-ACETAMINOPHEN 5-325 MG PO TABS: 1.0000 | ORAL_TABLET | ORAL | Status: AC | PRN

## 2012-01-21 MED ORDER — KETOROLAC TROMETHAMINE 60 MG/2ML IM SOLN
60.0000 mg | Freq: Once | INTRAMUSCULAR | Status: AC
  Administered 2012-01-21: 60 mg via INTRAMUSCULAR
  Filled 2012-01-21: qty 2

## 2012-01-21 NOTE — Telephone Encounter (Signed)
[email or web address] x1 

## 2012-01-21 NOTE — ED Notes (Signed)
Pt Seen here at [facility] for pain right side and right back. Pt was treated for COPD at that time. Pt has raised areas on right side and a patch on his mid back. Pt states pain 10/10 when at it's worse but right now it is 6/10.

## 2012-01-21 NOTE — ED Provider Notes (Signed)
History     CSN: [id number]  Arrival date & time 01/21/12  1301   First MD Initiated Contact with Patient 01/21/12 1329     2:38 PM HPI patient reports he was seen here 7 days ago for chest pain. Patient was diagnosed COPD exacerbation and sent home. States. States developed blisters on the left chest and back. Reports turned on his primary care physician, [facility], who was unable to get an appointment. Reports she's been trying to use and without relief. Reports rash is aggravating his breathing. States painful to take deep breaths due to the location of rash. Denies fever, neck pain. Reports a history of chickenpox. Patient is a 61 y.o. male presenting with rash. The history is provided by the patient.  Rash  There has been no fever. The pain is severe. The pain has been constant since onset. Associated symptoms include blisters and pain.    Past Medical History  Diagnosis Date  . COPD (chronic obstructive pulmonary disease)   . CAD (coronary artery disease)   . Hypertension   . Hyperlipidemia     Past Surgical History  Procedure Date  . Appendectomy     Family History  Problem Relation Age of Onset  . Coronary artery disease    . Hypertension      History  Substance Use Topics  . Smoking status: Current Some Day Smoker -- 0.3 packs/day for 40 years  . Smokeless tobacco: Not on file   Comment: approx 3-4 cigarettes per week  . Alcohol Use: No      Review of Systems  Constitutional: Negative for fever and chills.  Respiratory: Positive for shortness of breath. Negative for cough.   Cardiovascular: Positive for chest pain.  Skin: Positive for rash.  All other systems reviewed and are negative.    Allergies  Lipitor  Home Medications   Current Outpatient Rx  Name Route Sig Dispense Refill  . ACETAMINOPHEN 500 MG PO TABS Oral Take 500-1,000 mg by mouth every 6 (six) hours as needed. For pain.    . ALBUTEROL SULFATE (2.5 MG/3ML) 0.083% IN NEBU Nebulization Take 3  mLs (2.5 mg total) by nebulization every 6 (six) hours as needed for wheezing (DX:  496). 360 mL 3  . ASPIRIN 325 MG PO TABS Oral Take 325 mg by mouth daily.      . BUDESONIDE 0.25 MG/2ML IN SUSP Nebulization Take 0.25 mg by nebulization 2 (two) times daily.      Marland Kitchen CLOPIDOGREL BISULFATE 75 MG PO TABS Oral Take 75 mg by mouth daily.      . FUROSEMIDE 20 MG PO TABS Oral Take 20 mg by mouth daily.      . IPRATROPIUM BROMIDE 0.02 % IN SOLN Nebulization Take 2.5 mLs (500 mcg total) by nebulization 4 (four) times daily. 75 mL 11  . OMEPRAZOLE 20 MG PO CPDR Oral Take 20 mg by mouth daily as needed. For acid reflux.    Marland Kitchen TRAMADOL HCL 50 MG PO TABS Oral Take 1 tablet (50 mg total) by mouth every 6 (six) hours as needed for pain. 15 tablet 0  . NITROGLYCERIN 0.4 MG SL SUBL Sublingual Place 0.4 mg under the tongue every 5 (five) minutes x 3 doses as needed. For chest pain.    Marland Kitchen PRAVASTATIN SODIUM 40 MG PO TABS Oral Take 40 mg by mouth daily.        BP 114/83  Pulse 89  Temp 98.1 F (36.7 C)  Resp 22  Ht  5\' 11"  (1.803 m)  Wt 196 lb (88.905 kg)  BMI 27.34 kg/m2  SpO2 98%  Physical Exam  Constitutional: He is oriented to person, place, and time. He appears well-developed and well-nourished.  HENT:  Head: Normocephalic and atraumatic.  Eyes: Conjunctivae are normal. Pupils are equal, round, and reactive to light.  Neck: Normal range of motion. Neck supple.  Cardiovascular: Normal rate, regular rhythm and normal heart sounds.  Exam reveals no gallop and no friction rub.   No murmur heard. Pulmonary/Chest: Effort normal. He has wheezes. He has no rales. He exhibits no tenderness.  Abdominal: Soft. Bowel sounds are normal.  Neurological: He is alert and oriented to person, place, and time.  Skin: Skin is warm and dry. Rash noted. No erythema. No pallor.     Psychiatric: He has a normal mood and affect. His behavior is normal.    ED Course  Procedures   MDM    Patient is a rash for  approximately 6 days therefore patient and is out of therapeutic window for acyclovir. Will treat with analgesic pain medication. The patient has an appointment with his primary care physician are rescheduled. Strongly advised patient to keep appointment despite coming here today to developed care plan future episodes.      Thomasene Lot, PA-C 01/21/12 1445  Thomasene Lot, PA-C 01/21/12 [phone]

## 2012-01-24 NOTE — Telephone Encounter (Signed)
I spoke with the patient and he states he saw his PCP for shingles.  Carron Curie, CMA

## 2012-01-28 NOTE — ED Provider Notes (Signed)
Medical screening examination/treatment/procedure(s) were performed by non-physician practitioner and as supervising physician I was immediately available for consultation/collaboration.  Braydon Kullman, MD 01/28/12 0105 

## 2012-03-21 ENCOUNTER — Other Ambulatory Visit: Payer: Self-pay | Admitting: Emergency Medicine

## 2012-03-21 MED ORDER — ALBUTEROL SULFATE (2.5 MG/3ML) 0.083% IN NEBU: 2.5000 mg | INHALATION_SOLUTION | Freq: Four times a day (QID) | RESPIRATORY_TRACT | Status: DC | PRN

## 2012-04-14 ENCOUNTER — Encounter: Payer: Self-pay | Admitting: Emergency Medicine

## 2012-04-14 ENCOUNTER — Ambulatory Visit (INDEPENDENT_AMBULATORY_CARE_PROVIDER_SITE_OTHER): Payer: Medicare Other | Admitting: Emergency Medicine

## 2012-04-14 VITALS — BP 124/74 | HR 75 | Temp 97.7°F | Ht 71.0 in | Wt 191.4 lb

## 2012-04-14 DIAGNOSIS — J441 Chronic obstructive pulmonary disease with (acute) exacerbation: Secondary | ICD-10-CM

## 2012-04-14 MED ORDER — PREDNISONE 20 MG PO TABS: 40.0000 mg | ORAL_TABLET | Freq: Every day | ORAL | Status: AC

## 2012-04-14 MED ORDER — BUDESONIDE 0.25 MG/2ML IN SUSP: 0.2500 mg | Freq: Two times a day (BID) | RESPIRATORY_TRACT | Status: DC

## 2012-04-14 NOTE — Patient Instructions (Signed)
Please continue your inhaled medications as you are taking them We talked about smoking today. Next visit we will try to set a quit date if you are ready to do so Take prednisone for 5 days as directed and then stop Follow with Dr Delton Coombes in 4 months or sooner if you have any problems.
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
## 2012-04-14 NOTE — Assessment & Plan Note (Signed)
Very mild flare after exposure to fumes - continue nebs as ordered  - smoking cessation discussed - short course pred 40mg  to reverse acute bronchospasm, do not believe he needs long taper.  - rov 4 months

## 2012-07-31 ENCOUNTER — Ambulatory Visit (INDEPENDENT_AMBULATORY_CARE_PROVIDER_SITE_OTHER): Payer: Self-pay | Admitting: Emergency Medicine

## 2012-07-31 ENCOUNTER — Encounter: Payer: Self-pay | Admitting: Emergency Medicine

## 2012-07-31 VITALS — BP 118/78 | HR 80 | Temp 97.8°F | Ht 71.0 in | Wt 193.4 lb

## 2012-07-31 DIAGNOSIS — F17200 Nicotine dependence, unspecified, uncomplicated: Secondary | ICD-10-CM

## 2012-07-31 DIAGNOSIS — J449 Chronic obstructive pulmonary disease, unspecified: Secondary | ICD-10-CM

## 2012-07-31 DIAGNOSIS — I2510 Atherosclerotic heart disease of native coronary artery without angina pectoris: Secondary | ICD-10-CM

## 2012-07-31 DIAGNOSIS — E785 Hyperlipidemia, unspecified: Secondary | ICD-10-CM

## 2012-07-31 DIAGNOSIS — I10 Essential (primary) hypertension: Secondary | ICD-10-CM

## 2012-07-31 MED ORDER — BUDESONIDE 0.25 MG/2ML IN SUSP: 0.2500 mg | Freq: Two times a day (BID) | RESPIRATORY_TRACT | Status: DC

## 2012-07-31 MED ORDER — NITROGLYCERIN 0.4 MG SL SUBL: 0.4000 mg | SUBLINGUAL_TABLET | SUBLINGUAL | Status: DC | PRN

## 2012-07-31 MED ORDER — SIMVASTATIN 20 MG PO TABS: 20.0000 mg | ORAL_TABLET | Freq: Every evening | ORAL | Status: DC

## 2012-07-31 MED ORDER — FUROSEMIDE 20 MG PO TABS: 20.0000 mg | ORAL_TABLET | Freq: Every day | ORAL | Status: DC

## 2012-07-31 NOTE — Assessment & Plan Note (Signed)
Discussed cessation, he continues to try to cut down. No quit date set

## 2012-07-31 NOTE — Assessment & Plan Note (Signed)
Refill budesonide for 3 month supply, will fax to [employer] for assistance program Continue the Duonebs on a schedule

## 2012-07-31 NOTE — Assessment & Plan Note (Signed)
Needs refill lasix since he cannot get at [facility]

## 2012-07-31 NOTE — Assessment & Plan Note (Signed)
Needs refill simvastatin

## 2012-07-31 NOTE — Patient Instructions (Addendum)
Please continue your DuoNebs and Pulmicort as secheduled Continue to work on stopping smoking We refilled your lasix, simvastatin and nitroglycerin since you are unable to get from [facility] Follow with Dr Delton Coombes in 4 months or sooner if you have any problems.

## 2012-07-31 NOTE — Progress Notes (Signed)
Subjective:  Patient ID: [name], male    DOB: 11-Sep-1951, 61 y.o.   MRN: [id number] HPI 61 yo smoker, hx CAD/stents, COPD, admitted 01/2011 for acute exacerbation in setting suspected viral pneumonitis. Current regimen is albuterol/atrovent nebs 2 -3 x a day, budesonide neds bid. He believes that his breathing has declined some since his oral steroids. His breathing is worst with bending over, with exertion.   ROV 08/30/11 -- COPD with continued smoking. Has been seen in the ED since last visit for CP, admitted for 2 days. Was planning for stress test but he has not done it yet. CT-PA was negative for PE. I added omeprazole last time to duonebs + pulmicort bid to see if we could improve nocturnal sx. He tells me that his breathing is about the same as a month ago. His nocturnal awakenings and GERD are better. His last PFT was 2006.   10/22/11 -- Pt presents for follow up of hospital admission for COPD exacerbation . No discharge summary available at today's visit. Patient was admitted, treated with IV antibiotics, steroids, and nebulizer treatments. Patient says he is slowly improving with decreased cough, congestion, and wheezing. He still has on 3 days left of prednisone and antibiotic therapy. He denies any hemoptysis, chest pain, abdominal pain, leg swelling, or edema. He remains on Pulmicort nebulizer twice daily and Atrovent nebulizer 4 times daily. Unfortunately, he has not stopped smoking. We discussed in detail smoking cessation.  ROV 11/22/11 -- COPD, continued tobacco, last seen 11/2. He is having a bit more wheezing and mucous, notices that this happens when he comes of steroids. Mucous clear to yellow. [name] has seen blood. Overall he feels he is doing fairly well. Taking duonebs qid, pulmicort bid. Smoking has decreased significantly, sometimes several days between cigarettes. No flares since last time.   ROV 04/14/12 -- COPD, continued tobacco. Taking duonebs qid, pulmicort bid. Since  last time had shingles on his L back and flank that were cause pain and breathing difficulty. These are improved now. His breathing has been OK but recent exposure to some cleaning fumes and car exhaust. Not back to baseline yet.   ROV 07/31/12 -- COPD, continued smoking, frequent exacerbations. Having more nocturnal wheeze compared with last time. No exacerbations since last time. He has been on DuoNebs and Pulmicort Nebs, but he ran out of pulmicort about a month ago - can tell that he misses it. 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    Objective:   Filed Vitals:   07/31/12 1332  BP: 118/78  Pulse: 80  Temp: 97.8 F (36.6 C)   Gen: Pleasant, in no distress,  normal affect  ENT: No lesions,  mouth clear,  oropharynx clear, no postnasal drip  Neck: No JVD, no TMG, no carotid bruits  Lungs: Coarse BS w/o use of accessory muscles, no dullness to percussion   Cardiovascular: RRR, heart sounds normal, no murmur or gallops, no peripheral edema  Musculoskeletal: No deformities, no cyanosis or clubbing  Neuro: alert, non focal  Skin: Warm, no lesions or rashes   Assessment & Plan:   TOBACCO USER Discussed cessation, he continues to try to cut down. No quit date set  HYPERTENSION Needs refill lasix since he cannot get at [facility]  HYPERLIPIDEMIA Needs refill simvastatin  COPD Refill budesonide for 3 month supply, will fax to [employer] for assistance program Continue the Duonebs on a schedule  CAD  Refill NTG since unable to get via [facility]

## 2012-07-31 NOTE — Assessment & Plan Note (Signed)
Refill NTG since unable to get via [facility]

## 2012-09-05 ENCOUNTER — Telehealth: Payer: Self-pay | Admitting: Emergency Medicine

## 2012-09-05 DIAGNOSIS — J449 Chronic obstructive pulmonary disease, unspecified: Secondary | ICD-10-CM

## 2012-09-05 NOTE — Telephone Encounter (Signed)
I spoke with pt and he is calling and checking on the status of his patient assistance for pulmicort inhaler. He stated this was suppose to be done when he was in the office last. Will forward to lauren to se eif she has seen anything regarding this to follow up on Please advise Lauren thanks

## 2012-09-11 NOTE — Telephone Encounter (Signed)
Lauren have you seen anything regarding pt assistance yet?

## 2012-09-11 NOTE — Telephone Encounter (Signed)
LMTCB

## 2012-09-11 NOTE — Telephone Encounter (Signed)
Sorry, I have not had any papers for this patient for pulmicort. Can we see when he gave them to someone?

## 2012-09-12 NOTE — Telephone Encounter (Signed)
[email or web address] x1 

## 2012-09-13 MED ORDER — BUDESONIDE 0.25 MG/2ML IN SUSP: 0.2500 mg | Freq: Two times a day (BID) | RESPIRATORY_TRACT | Status: DC

## 2012-09-13 NOTE — Telephone Encounter (Signed)
Received papers from [employer], have filled them out, awaiting signature from RB.

## 2012-09-13 NOTE — Telephone Encounter (Signed)
Spoke with patient-states he has not gotten any medication from pt assistance. I called the patient assistance number and was informed that pts assistance had ran out and they would need a new enrollment form, Rx, and proof of income faxed to them; would take about 7-14 days to decided if patient will continue to get assistance. Pt is aware and will come by today to fill out and give me the needed information. I have left papers at front with [employer] and pt will ask for me when he gets here. Will forward to Lauren's in basket to hold for approval or denial.

## 2012-09-14 NOTE — Telephone Encounter (Signed)
Patients forms were faxed today, will await response.

## 2012-09-19 ENCOUNTER — Encounter: Payer: Self-pay | Admitting: *Deleted

## 2012-09-19 ENCOUNTER — Telehealth: Payer: Self-pay | Admitting: Emergency Medicine

## 2012-09-19 NOTE — Telephone Encounter (Signed)
Lauren, have you seen anything on pt's med assistance?  Thanks!

## 2012-09-19 NOTE — Telephone Encounter (Signed)
Called [place] to check on status of application.  Informed that patient has been approved for assistance from 9.27.13 to 9.27.14.  Representative informed me that patients first shipment has been ordered and expected to arrive in 7-10 business days from day of approval.   ATC patient at both numbers in chart, no answer and no option to leave message. WCB

## 2012-09-19 NOTE — Telephone Encounter (Signed)
Called number several times and keep getting disconnected. Will need to try again in morning.   We do not have any samples but also need to let patient know he was approved from [facility] for patient assistance-see previous phone note(in Lauren's in basket).

## 2012-09-21 NOTE — Telephone Encounter (Signed)
ATC patient, no answer no option to leave message. WCB

## 2012-09-21 NOTE — Telephone Encounter (Signed)
Pt is aware of this. He stated he was informed of being approved for PA. He should received either this week or next. Nothing further was needed

## 2012-09-21 NOTE — Telephone Encounter (Signed)
Pt aware.

## 2012-10-23 ENCOUNTER — Telehealth: Payer: Self-pay | Admitting: Emergency Medicine

## 2012-10-23 MED ORDER — PREDNISONE 10 MG PO TABS: ORAL_TABLET | ORAL | Status: DC

## 2012-10-23 NOTE — Telephone Encounter (Signed)
He needs to be seen. In meantime he can do pred taper:  Take 40mg  daily for 3 days, then 30mg  daily for 3 days, then 20mg  daily for 3 days, then 10mg  daily for 3 days, then stop

## 2012-10-23 NOTE — Telephone Encounter (Signed)
Spoke with pt and notified of recs per Dr. Delton Coombes Pt verbalized understanding, and has scheduled ov with TP for Thurs 10/26/12 Rx for pred taper was sent to pharm
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
## 2012-10-26 ENCOUNTER — Ambulatory Visit (INDEPENDENT_AMBULATORY_CARE_PROVIDER_SITE_OTHER): Payer: Self-pay | Admitting: Adult Health

## 2012-10-26 ENCOUNTER — Encounter: Payer: Self-pay | Admitting: Adult Health

## 2012-10-26 VITALS — BP 122/78 | HR 90 | Temp 97.2°F | Ht 71.0 in | Wt 191.2 lb

## 2012-10-26 DIAGNOSIS — J449 Chronic obstructive pulmonary disease, unspecified: Secondary | ICD-10-CM

## 2012-10-26 MED ORDER — DOXYCYCLINE HYCLATE 100 MG PO TABS: 100.0000 mg | ORAL_TABLET | Freq: Two times a day (BID) | ORAL | Status: AC

## 2012-10-26 NOTE — Assessment & Plan Note (Signed)
Slow to resolve exacerbation   Plan Doxycycline 100mg  Twice daily  For 7 days  Mucinex DM Twice daily  As needed  Cough/congestion  Fluids and rest  Finish Prednisone taper .  Please contact office for sooner follow up if symptoms do not improve or worsen or seek emergency care

## 2012-10-26 NOTE — Patient Instructions (Addendum)
Doxycycline 100mg  Twice daily  For 7 days  Mucinex DM Twice daily  As needed  Cough/congestion  Fluids and rest  Finish Prednisone taper .  Please contact office for sooner follow up if symptoms do not improve or worsen or seek emergency care

## 2012-10-26 NOTE — Progress Notes (Signed)
Subjective:  Patient ID: [name], male    DOB: 1951-02-14, 61 y.o.   MRN: [id number] HPI 61 yo smoker, hx CAD/stents, COPD, admitted 01/2011 for acute exacerbation in setting suspected viral pneumonitis. Current regimen is albuterol/atrovent nebs 2 -3 x a day, budesonide neds bid. He believes that his breathing has declined some since his oral steroids. His breathing is worst with bending over, with exertion.   ROV 08/30/11 -- COPD with continued smoking. Has been seen in the ED since last visit for CP, admitted for 2 days. Was planning for stress test but he has not done it yet. CT-PA was negative for PE. I added omeprazole last time to duonebs + pulmicort bid to see if we could improve nocturnal sx. He tells me that his breathing is about the same as a month ago. His nocturnal awakenings and GERD are better. His last PFT was 2006.   10/22/11 -- Pt presents for follow up of hospital admission for COPD exacerbation . No discharge summary available at today's visit. Patient was admitted, treated with IV antibiotics, steroids, and nebulizer treatments. Patient says he is slowly improving with decreased cough, congestion, and wheezing. He still has on 3 days left of prednisone and antibiotic therapy. He denies any hemoptysis, chest pain, abdominal pain, leg swelling, or edema. He remains on Pulmicort nebulizer twice daily and Atrovent nebulizer 4 times daily. Unfortunately, he has not stopped smoking. We discussed in detail smoking cessation.  ROV 11/22/11 -- COPD, continued tobacco, last seen 11/2. He is having a bit more wheezing and mucous, notices that this happens when he comes of steroids. Mucous clear to yellow. [name] has seen blood. Overall he feels he is doing fairly well. Taking duonebs qid, pulmicort bid. Smoking has decreased significantly, sometimes several days between cigarettes. No flares since last time.   ROV 04/14/12 -- COPD, continued tobacco. Taking duonebs qid, pulmicort bid. Since  last time had shingles on his L back and flank that were cause pain and breathing difficulty. These are improved now. His breathing has been OK but recent exposure to some cleaning fumes and car exhaust. Not back to baseline yet.   ROV 07/31/12 -- COPD, continued smoking, frequent exacerbations. Having more nocturnal wheeze compared with last time. No exacerbations since last time. He has been on DuoNebs and Pulmicort Nebs, but he ran out of pulmicort about a month ago - can tell that he misses it. 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   10/26/2012 Acute OV  Complains of  wheezing, chest tightness, prod cough w/ yellow phlem, and increased SOB x 1 week. Patient was called in a prednisone taper. 3 days ago. Does feel the symptoms are starting to get better, but still has a congested cough with thick mucus  He denies any hemoptysis, orthopnea, PND, or leg swelling  ROS Constitutional:   No  weight loss, night sweats,  Fevers, chills,  +fatigue, or  lassitude.  HEENT:   No headaches,  Difficulty swallowing,  Tooth/dental problems, or  Sore throat,                No sneezing, itching, ear ache, nasal congestion, post nasal drip,   CV:  No chest pain,  Orthopnea, PND, swelling in lower extremities, anasarca, dizziness, palpitations, syncope.   GI  No heartburn, indigestion, abdominal pain, nausea, vomiting, diarrhea, change in bowel habits, loss of appetite, bloody stools.   Resp:  No coughing up of blood.    No chest wall deformity  Skin: no rash or lesions.  GU: no dysuria, change in color of urine, no urgency or frequency.  No flank pain, no hematuria   MS:  No joint pain or swelling.  No decreased range of motion.  No back pain.  Psych:  No change in mood or affect. No depression or anxiety.  No memory loss.      Objective:    Gen: Pleasant, in no distress,  normal affect  ENT: No lesions,  mouth clear,  oropharynx clear, no  postnasal drip  Neck: No JVD, no TMG, no carotid bruits  Lungs: Few rhonchi w/o  use of accessory muscles, no dullness to percussion   Cardiovascular: RRR, heart sounds normal, no murmur or gallops, no peripheral edema  Musculoskeletal: No deformities, no cyanosis or clubbing  Neuro: alert, non focal  Skin: Warm, no lesions or rashes   Assessment & Plan:

## 2012-10-27 ENCOUNTER — Telehealth: Payer: Self-pay | Admitting: Adult Health

## 2012-10-27 MED ORDER — AZITHROMYCIN 250 MG PO TABS: ORAL_TABLET | ORAL | Status: DC

## 2012-10-27 NOTE — Telephone Encounter (Signed)
Per Dr. Delton Coombes give Zpak. Rx sent. Pt is aware.[name], CMA

## 2012-10-27 NOTE — Telephone Encounter (Signed)
Pt was seen 10-26-2012 by TP:  Plan  Doxycycline 100mg  Twice daily For 7 days  Mucinex DM Twice daily As needed Cough/congestion  Fluids and rest  Finish Prednisone taper .  Please contact office for sooner follow up if symptoms do not improve or worsen or seek emergency care   As this is a RB patient will forward him to advise.    Allergies  Allergen Reactions  . Lipitor (Atorvastatin Calcium)     Muscle aches

## 2012-10-31 ENCOUNTER — Telehealth: Payer: Self-pay | Admitting: Adult Health

## 2012-10-31 NOTE — Telephone Encounter (Signed)
Per our records Dr. Delton Coombes gave him 11 refills back in 07/2012. I called [facility] and they have record of 11 refills. I asked them to get this filled for him. Pt is aware that this has been taken care of and will call [facility] when he needs further refills.

## 2012-11-24 ENCOUNTER — Telehealth: Payer: Self-pay | Admitting: Emergency Medicine

## 2012-11-24 DIAGNOSIS — J449 Chronic obstructive pulmonary disease, unspecified: Secondary | ICD-10-CM

## 2012-11-24 MED ORDER — BUDESONIDE 0.25 MG/2ML IN SUSP: 0.2500 mg | Freq: Two times a day (BID) | RESPIRATORY_TRACT | Status: DC

## 2012-11-24 MED ORDER — IPRATROPIUM BROMIDE 0.02 % IN SOLN: 500.0000 ug | Freq: Four times a day (QID) | RESPIRATORY_TRACT | Status: DC

## 2012-11-24 NOTE — Telephone Encounter (Signed)
Spoke with [employer] from pharmacy. States she spoke with patient when he came to pharmacy and he does not need budesonide.  He instead needs ipratropium neb.  This order has been placed, the budesonide neb has been cancelled.  Nothing further needed at this time.

## 2012-11-24 NOTE — Addendum Note (Signed)
Addended by: Orma Flaming D on: 11/24/2012 02:24 PM   Modules accepted: Orders

## 2012-11-24 NOTE — Telephone Encounter (Signed)
Pt req rx refill on budesonide. Rx sent pt aware and nothing further needed at this time.

## 2012-11-27 ENCOUNTER — Telehealth: Payer: Self-pay | Admitting: Emergency Medicine

## 2012-11-27 MED ORDER — PREDNISONE 10 MG PO TABS: ORAL_TABLET | ORAL | Status: DC

## 2012-11-27 MED ORDER — AZITHROMYCIN 250 MG PO TABS: ORAL_TABLET | ORAL | Status: DC

## 2012-11-27 NOTE — Telephone Encounter (Signed)
Ok for z pak.

## 2012-11-27 NOTE — Telephone Encounter (Signed)
Spoke with pt and notified of recs per MR Pt verbalized understandin Rx for zpack was sent to pharm

## 2012-11-27 NOTE — Telephone Encounter (Signed)
Likely in [facility]  Please take Take prednisone 40mg  once daily x 3 days, then 30mg  once daily x 3 days, then 20mg  once daily x 3 days, then prednisone 10mg  once daily  x 3 days and stop  Take doxycycline 100mg  po twice daily x 5 days; take after meals and avoid sunlight  SHould also take antibiotics  If worse go to ER

## 2012-11-27 NOTE — Telephone Encounter (Signed)
Spoke with patient, patient c/o wheezing and chest feeling sore from trying to breath.  Patient requesting rx for pred taper.  Patient last seen 10/26/12 by Tammy.  Dr. Marchelle Gearing please advise if this ok to send. Thank You

## 2012-11-27 NOTE — Telephone Encounter (Signed)
Pred taper has been sent in to verified pharmacy -- [facility]. Patient also made aware of other recs per Dr. Marchelle Gearing.  Patient states that "last time" when he was prescribed doxy he went to pharmacy and this med was too expensive. Patient requesting an alternate antibiotic be called in, pt suggested zithromycin.  Dr. Marchelle Gearing please advise, thank you

## 2012-12-27 ENCOUNTER — Encounter: Payer: Self-pay | Admitting: Emergency Medicine

## 2012-12-27 ENCOUNTER — Ambulatory Visit (INDEPENDENT_AMBULATORY_CARE_PROVIDER_SITE_OTHER): Payer: Self-pay | Admitting: Emergency Medicine

## 2012-12-27 VITALS — BP 130/78 | HR 97 | Temp 97.4°F | Ht 71.0 in | Wt 191.2 lb

## 2012-12-27 DIAGNOSIS — J449 Chronic obstructive pulmonary disease, unspecified: Secondary | ICD-10-CM

## 2012-12-27 MED ORDER — PREDNISONE 10 MG PO TABS: 10.0000 mg | ORAL_TABLET | Freq: Every day | ORAL | Status: DC

## 2012-12-27 NOTE — Assessment & Plan Note (Signed)
Severe disease with freq exacerbations. Considered trying daliresp but cost is a problem here. May have to try chronic low dose pred.  - congratulated progress in smoking cessation, encouraged him to work towards stopping - contineu neb regimen - start pred 10mg   - rov 1 month - consider daliresp in future.
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
## 2012-12-27 NOTE — Patient Instructions (Addendum)
Please continue your nebulized medications as you are taking them Start prednisone 10mg  daily until next visit Congratulations on decreasing your smoking. Continue to work on stopping altogether Follow with Dr Delton Coombes in 6 weeks or sooner if you have any problems

## 2013-02-05 ENCOUNTER — Encounter (HOSPITAL_COMMUNITY): Payer: Self-pay

## 2013-02-05 ENCOUNTER — Inpatient Hospital Stay (HOSPITAL_COMMUNITY)
Admission: EM | Admit: 2013-02-05 | Discharge: 2013-02-16 | DRG: 189 | Disposition: A | Discharge status: Home Health | Payer: Medicare Other | Attending: Internal Medicine | Admitting: Internal Medicine

## 2013-02-05 ENCOUNTER — Telehealth: Payer: Self-pay | Admitting: Emergency Medicine

## 2013-02-05 ENCOUNTER — Emergency Department (HOSPITAL_COMMUNITY): Payer: Medicare Other

## 2013-02-05 DIAGNOSIS — J9600 Acute respiratory failure, unspecified whether with hypoxia or hypercapnia: Principal | ICD-10-CM | POA: Diagnosis present

## 2013-02-05 DIAGNOSIS — I10 Essential (primary) hypertension: Secondary | ICD-10-CM | POA: Diagnosis present

## 2013-02-05 DIAGNOSIS — E785 Hyperlipidemia, unspecified: Secondary | ICD-10-CM | POA: Diagnosis present

## 2013-02-05 DIAGNOSIS — F17200 Nicotine dependence, unspecified, uncomplicated: Secondary | ICD-10-CM | POA: Diagnosis present

## 2013-02-05 DIAGNOSIS — R7309 Other abnormal glucose: Secondary | ICD-10-CM | POA: Diagnosis present

## 2013-02-05 DIAGNOSIS — T380X5A Adverse effect of glucocorticoids and synthetic analogues, initial encounter: Secondary | ICD-10-CM | POA: Diagnosis present

## 2013-02-05 DIAGNOSIS — J449 Chronic obstructive pulmonary disease, unspecified: Secondary | ICD-10-CM

## 2013-02-05 DIAGNOSIS — Z23 Encounter for immunization: Secondary | ICD-10-CM

## 2013-02-05 DIAGNOSIS — IMO0002 Reserved for concepts with insufficient information to code with codable children: Secondary | ICD-10-CM

## 2013-02-05 DIAGNOSIS — Z79899 Other long term (current) drug therapy: Secondary | ICD-10-CM

## 2013-02-05 DIAGNOSIS — R079 Chest pain, unspecified: Secondary | ICD-10-CM

## 2013-02-05 DIAGNOSIS — J9601 Acute respiratory failure with hypoxia: Secondary | ICD-10-CM

## 2013-02-05 DIAGNOSIS — B338 Other specified viral diseases: Secondary | ICD-10-CM | POA: Diagnosis present

## 2013-02-05 DIAGNOSIS — I2510 Atherosclerotic heart disease of native coronary artery without angina pectoris: Secondary | ICD-10-CM | POA: Diagnosis present

## 2013-02-05 DIAGNOSIS — I5030 Unspecified diastolic (congestive) heart failure: Secondary | ICD-10-CM | POA: Diagnosis present

## 2013-02-05 DIAGNOSIS — J441 Chronic obstructive pulmonary disease with (acute) exacerbation: Secondary | ICD-10-CM | POA: Diagnosis present

## 2013-02-05 DIAGNOSIS — J4489 Other specified chronic obstructive pulmonary disease: Secondary | ICD-10-CM | POA: Diagnosis present

## 2013-02-05 DIAGNOSIS — R0902 Hypoxemia: Secondary | ICD-10-CM

## 2013-02-05 DIAGNOSIS — B974 Respiratory syncytial virus as the cause of diseases classified elsewhere: Secondary | ICD-10-CM | POA: Diagnosis present

## 2013-02-05 DIAGNOSIS — I509 Heart failure, unspecified: Secondary | ICD-10-CM | POA: Diagnosis present

## 2013-02-05 DIAGNOSIS — J40 Bronchitis, not specified as acute or chronic: Secondary | ICD-10-CM | POA: Diagnosis present

## 2013-02-05 DIAGNOSIS — J9621 Acute and chronic respiratory failure with hypoxia: Secondary | ICD-10-CM | POA: Diagnosis present

## 2013-02-05 LAB — CBC WITH DIFFERENTIAL/PLATELET
Basophils Absolute: 0 10*3/uL (ref 0.0–0.1)
Eosinophils Absolute: 0.1 10*3/uL (ref 0.0–0.7)
Lymphs Abs: 0.9 10*3/uL (ref 0.7–4.0)
MCH: 29.4 pg (ref 26.0–34.0)
Neutrophils Relative %: 75 % (ref 43–77)
Platelets: 157 10*3/uL (ref 150–400)
RBC: 5.04 MIL/uL (ref 4.22–5.81)
RDW: 14.1 % (ref 11.5–15.5)
WBC: 6 10*3/uL (ref 4.0–10.5)

## 2013-02-05 LAB — BASIC METABOLIC PANEL
Calcium: 9.1 mg/dL (ref 8.4–10.5)
GFR calc Af Amer: >90 mL/min (ref >90)
GFR calc non Af Amer: >90 mL/min (ref >90)
Glucose, Bld: 74 mg/dL (ref 70–99)
Potassium: 3.9 mEq/L (ref 3.5–5.1)
Sodium: 138 mEq/L (ref 135–145)

## 2013-02-05 MED ORDER — ALBUTEROL SULFATE (5 MG/ML) 0.5% IN NEBU
2.5000 mg | INHALATION_SOLUTION | Freq: Once | RESPIRATORY_TRACT | Status: AC
  Administered 2013-02-05: 2.5 mg via RESPIRATORY_TRACT
  Filled 2013-02-05: qty 0.5

## 2013-02-05 MED ORDER — PREDNISONE 20 MG PO TABS
60.0000 mg | ORAL_TABLET | Freq: Once | ORAL | Status: AC
  Administered 2013-02-05: 60 mg via ORAL
  Filled 2013-02-05: qty 3

## 2013-02-05 MED ORDER — ALBUTEROL (5 MG/ML) CONTINUOUS INHALATION SOLN
15.0000 mg | INHALATION_SOLUTION | RESPIRATORY_TRACT | Status: DC
  Administered 2013-02-05: 15 mg via RESPIRATORY_TRACT
  Filled 2013-02-05: qty 20

## 2013-02-05 NOTE — ED Notes (Signed)
Patient states that he began having SOB and sore throat 2 days ago. Patient has a history of COPD and uses home O2 and albuterol and Atrovent every 6 hours at home. Patient states the neb treatments have not helped much. Patient has expiratory wheezing and using accessory muscles.

## 2013-02-05 NOTE — ED Provider Notes (Signed)
History     CSN: [id number]  Arrival date & time 02/05/13  1825   First MD Initiated Contact with Patient 02/05/13 2201      Chief Complaint  Patient presents with  . Shortness of Breath    (Consider location/radiation/quality/duration/timing/severity/associated sxs/prior treatment) HPI Comments: [name] is a 62 y.o. Male who complains of worsening dyspnea for several days. He has a cough productive of sputum. He has had chills, without documented fever. He denies nausea, or vomiting. He has chest tightness, but no chest pain. He is currently on a stable dose of prednisone, 10 mg each day. No recent admission for COPD exacerbation. He was intubated 4 years ago. He uses an oxygen concentrator at home, as needed. He continues to smoke cigarettes. There are no other modifying factors.  Patient is a 62 y.o. male presenting with shortness of breath. The history is provided by the patient.  Shortness of Breath   Past Medical History  Diagnosis Date  . COPD (chronic obstructive pulmonary disease)   . CAD (coronary artery disease)   . Hypertension   . Hyperlipidemia   . Osteomyelitis     Past Surgical History  Procedure Laterality Date  . Appendectomy      Family History  Problem Relation Age of Onset  . Coronary artery disease    . Hypertension      History  Substance Use Topics  . Smoking status: Current Some Day Smoker -- 0.30 packs/day for 40 years    Types: Cigarettes  . Smokeless tobacco: Never Used     Comment: approx 5-6 cigarettes per week  . Alcohol Use: No      Review of Systems  Respiratory: Positive for shortness of breath.   All other systems reviewed and are negative.    Allergies  Lipitor  Home Medications   Current Outpatient Rx  Name  Route  Sig  Dispense  Refill  . albuterol (PROVENTIL) (2.5 MG/3ML) 0.083% nebulizer solution   Nebulization   Take 3 mLs (2.5 mg total) by nebulization every 6 (six) hours as needed for wheezing (DX:   496).   360 mL   3   . aspirin 325 MG tablet   Oral   Take 650 mg by mouth every morning.          . budesonide (PULMICORT) 0.25 MG/2ML nebulizer solution   Nebulization   Take 2 mLs (0.25 mg total) by nebulization 2 (two) times daily.   360 mL   3     Dx Code 496   . clopidogrel (PLAVIX) 75 MG tablet   Oral   Take 75 mg by mouth daily.           . furosemide (LASIX) 20 MG tablet   Oral   Take 1 tablet (20 mg total) by mouth daily.   30 tablet   11   . ipratropium (ATROVENT) 0.02 % nebulizer solution   Nebulization   Take 2.5 mLs (500 mcg total) by nebulization 4 (four) times daily.   75 mL   11     Dx 496   . predniSONE (DELTASONE) 10 MG tablet   Oral   Take 1 tablet (10 mg total) by mouth daily.   30 tablet   2   . simvastatin (ZOCOR) 20 MG tablet   Oral   Take 1 tablet (20 mg total) by mouth every evening.   30 tablet   11     BP 162/85  Pulse 102  Temp(Src) 98.4 F (36.9 C) (Oral)  Resp 14  SpO2 90%  Physical Exam  Nursing note and vitals reviewed. Constitutional: He is oriented to person, place, and time. He appears well-developed and well-nourished. He appears distressed (He appears uncomfortable).  HENT:  Head: Normocephalic and atraumatic.  Right Ear: External ear normal.  Left Ear: External ear normal.  Eyes: Conjunctivae and EOM are normal. Pupils are equal, round, and reactive to light.  Neck: Normal range of motion and phonation normal. Neck supple.  Cardiovascular: Regular rhythm, normal heart sounds and intact distal pulses.   Borderline tachycardia  Pulmonary/Chest: He is in respiratory distress (moderate). He has wheezes. He has no rales. He exhibits no tenderness and no bony tenderness.  Increased work of breathing, he sits in Tripod position. Decreased air movement, bilateral.  Abdominal: Soft. Normal appearance. There is no tenderness.  Musculoskeletal: Normal range of motion. He exhibits no edema and no tenderness.   Neurological: He is alert and oriented to person, place, and time. He has normal strength. No cranial nerve deficit or sensory deficit. He exhibits normal muscle tone. Coordination normal.  Skin: Skin is warm, dry and intact.  Psychiatric: He has a normal mood and affect. His behavior is normal. Judgment and thought content normal.    ED Course  Procedures (including critical care time)  Emergency department treatment: Albuterol nebulizer, single dose, followed by continuous over one hour.  23:45- he is more comfortable now, currently on the continuous nebulizer, and able to lie in a semi-recombinant position, without respiratory distress.  00:40- reevaluation- oxygen saturation 88% on 3 L nasal cannula oxygen. He is having more trouble breathing, with increased respiratory rate, now 25 per minute. At this time, he is tolerating some liquids, and food, and no longer holding himself in tripod position to help breathing. Third nebulizer ordered, with 10 mg albuterol over one hour and Atrovent.       Date: 10/06/2012  Rate: 95  Rhythm: normal sinus rhythm  QRS Axis: normal  PR and QT Intervals: normal  ST/T Wave abnormalities: normal  PR and QRS Conduction Disutrbances:left posterior fascicular block  Narrative Interpretation:   Old EKG Reviewed: unchanged   CRITICAL CARE Performed by: Mancel Bale L   Total critical care time: 40 minutes  Critical care time was exclusive of separately billable procedures and treating other patients.  Critical care was necessary to treat or prevent imminent or life-threatening deterioration.  Critical care was time spent personally by me on the following activities: development of treatment plan with patient and/or surrogate as well as nursing, discussions with consultants, evaluation of patient's response to treatment, examination of patient, obtaining history from patient or surrogate, ordering and performing treatments and interventions,  ordering and review of laboratory studies, ordering and review of radiographic studies, pulse oximetry and re-evaluation of patient's condition.   Labs Reviewed  CBC WITH DIFFERENTIAL  BASIC METABOLIC PANEL   Dg Chest 2 View  02/05/2013  *RADIOLOGY REPORT*  Clinical Data: Short of breath and wheezing  CHEST - 2 VIEW  Comparison: 01/12/2012  Findings: Mild hyperinflation with changes of COPD.  Negative for pneumonia.  Negative for heart failure or effusion.  Negative for mass lesion.  IMPRESSION: COPD without acute abnormality.   Original Report Authenticated By: Janeece Riggers, M.D.    Nursing notes, applicable records and vitals reviewed.  Radiologic Images/Reports reviewed.   1. COPD exacerbation   2. Hypoxia       MDM  Acute respiratory distress, secondary to COPD  exacerbation, doubt pneumonia, ACS, or PE. Patient is to be admitted for stabilization close evaluation of oxygenation; to ensure that his respiratory status does not worsen to the point where he will require intubation.         Flint Melter, MD 02/06/13 [phone]

## 2013-02-05 NOTE — Telephone Encounter (Signed)
Spoke with pt He is c/o sore throat, prod cough with clear sputum, and increased SOB and chest tightness OV with TP for tomorrow at 9 am Pt urged to seek emergent care sooner if needed

## 2013-02-06 ENCOUNTER — Encounter (HOSPITAL_COMMUNITY): Payer: Self-pay | Admitting: Internal Medicine

## 2013-02-06 ENCOUNTER — Ambulatory Visit: Payer: Self-pay | Admitting: Adult Health

## 2013-02-06 DIAGNOSIS — J449 Chronic obstructive pulmonary disease, unspecified: Secondary | ICD-10-CM | POA: Diagnosis present

## 2013-02-06 DIAGNOSIS — I10 Essential (primary) hypertension: Secondary | ICD-10-CM

## 2013-02-06 DIAGNOSIS — J441 Chronic obstructive pulmonary disease with (acute) exacerbation: Secondary | ICD-10-CM

## 2013-02-06 DIAGNOSIS — I2510 Atherosclerotic heart disease of native coronary artery without angina pectoris: Secondary | ICD-10-CM

## 2013-02-06 DIAGNOSIS — F17200 Nicotine dependence, unspecified, uncomplicated: Secondary | ICD-10-CM

## 2013-02-06 LAB — BLOOD GAS, ARTERIAL
Acid-Base Excess: 5.2 mmol/L — ABNORMAL HIGH (ref 0.0–2.0)
Drawn by: 129801
O2 Content: 3 L/min
O2 Saturation: 96.9 %
pO2, Arterial: 87.9 mmHg (ref 80.0–100.0)

## 2013-02-06 LAB — CBC
HCT: 43.2 % (ref 39.0–52.0)
Hemoglobin: 14.4 g/dL (ref 13.0–17.0)
MCH: 29.6 pg (ref 26.0–34.0)
MCV: 88.7 fL (ref 78.0–100.0)
RBC: 4.87 MIL/uL (ref 4.22–5.81)

## 2013-02-06 LAB — INFLUENZA PANEL BY PCR (TYPE A & B): Influenza A By PCR: NEGATIVE

## 2013-02-06 LAB — CREATININE, SERUM: GFR calc Af Amer: >90 mL/min (ref >90)

## 2013-02-06 MED ORDER — SODIUM CHLORIDE 0.9 % IV SOLN: 250.0000 mL | INTRAVENOUS | Status: DC | PRN

## 2013-02-06 MED ORDER — OSELTAMIVIR PHOSPHATE 75 MG PO CAPS
75.0000 mg | ORAL_CAPSULE | Freq: Two times a day (BID) | ORAL | Status: DC
  Administered 2013-02-06 (×2): 75 mg via ORAL
  Filled 2013-02-06 (×3): qty 1

## 2013-02-06 MED ORDER — CLOPIDOGREL BISULFATE 75 MG PO TABS
75.0000 mg | ORAL_TABLET | Freq: Every day | ORAL | Status: DC
  Administered 2013-02-06 – 2013-02-16 (×11): 75 mg via ORAL
  Filled 2013-02-06 (×15): qty 1

## 2013-02-06 MED ORDER — ALBUTEROL (5 MG/ML) CONTINUOUS INHALATION SOLN
10.0000 mg/h | INHALATION_SOLUTION | RESPIRATORY_TRACT | Status: DC
  Administered 2013-02-06: 10 mg/h via RESPIRATORY_TRACT

## 2013-02-06 MED ORDER — METHYLPREDNISOLONE SODIUM SUCC 40 MG IJ SOLR
INTRAMUSCULAR | Status: AC
  Administered 2013-02-06: 80 mg
  Filled 2013-02-06: qty 2

## 2013-02-06 MED ORDER — SODIUM CHLORIDE 0.9 % IJ SOLN: 3.0000 mL | INTRAMUSCULAR | Status: DC | PRN

## 2013-02-06 MED ORDER — OXYCODONE-ACETAMINOPHEN 5-325 MG PO TABS
2.0000 | ORAL_TABLET | Freq: Once | ORAL | Status: AC
  Administered 2013-02-06: 2 via ORAL
  Filled 2013-02-06: qty 2

## 2013-02-06 MED ORDER — ENOXAPARIN SODIUM 40 MG/0.4ML ~~LOC~~ SOLN
40.0000 mg | SUBCUTANEOUS | Status: DC
  Administered 2013-02-06 – 2013-02-16 (×11): 40 mg via SUBCUTANEOUS
  Filled 2013-02-06 (×12): qty 0.4

## 2013-02-06 MED ORDER — IPRATROPIUM BROMIDE 0.02 % IN SOLN
0.5000 mg | Freq: Once | RESPIRATORY_TRACT | Status: AC
  Administered 2013-02-06: 0.5 mg via RESPIRATORY_TRACT
  Filled 2013-02-06: qty 2.5

## 2013-02-06 MED ORDER — METHYLPREDNISOLONE SODIUM SUCC 125 MG IJ SOLR
80.0000 mg | Freq: Four times a day (QID) | INTRAMUSCULAR | Status: DC
  Administered 2013-02-06 – 2013-02-09 (×9): 80 mg via INTRAVENOUS
  Filled 2013-02-06 (×16): qty 1.28

## 2013-02-06 MED ORDER — SODIUM CHLORIDE 0.9 % IJ SOLN: 3.0000 mL | Freq: Two times a day (BID) | INTRAMUSCULAR | Status: DC

## 2013-02-06 MED ORDER — ALBUTEROL SULFATE (5 MG/ML) 0.5% IN NEBU
2.5000 mg | INHALATION_SOLUTION | Freq: Four times a day (QID) | RESPIRATORY_TRACT | Status: DC
  Administered 2013-02-06: 2.5 mg via RESPIRATORY_TRACT
  Filled 2013-02-06: qty 0.5

## 2013-02-06 MED ORDER — ONDANSETRON HCL 4 MG/2ML IJ SOLN: 4.0000 mg | Freq: Four times a day (QID) | INTRAMUSCULAR | Status: DC | PRN

## 2013-02-06 MED ORDER — DOCUSATE SODIUM 100 MG PO CAPS
100.0000 mg | ORAL_CAPSULE | Freq: Two times a day (BID) | ORAL | Status: DC
  Administered 2013-02-06 – 2013-02-16 (×21): 100 mg via ORAL
  Filled 2013-02-06 (×23): qty 1

## 2013-02-06 MED ORDER — GUAIFENESIN ER 600 MG PO TB12
1200.0000 mg | ORAL_TABLET | Freq: Two times a day (BID) | ORAL | Status: DC
  Administered 2013-02-06 – 2013-02-16 (×10): 1200 mg via ORAL
  Filled 2013-02-06 (×23): qty 2

## 2013-02-06 MED ORDER — ACETAMINOPHEN 325 MG PO TABS
650.0000 mg | ORAL_TABLET | ORAL | Status: DC | PRN
  Administered 2013-02-07 – 2013-02-16 (×3): 650 mg via ORAL
  Filled 2013-02-06 (×3): qty 2

## 2013-02-06 MED ORDER — LEVOFLOXACIN IN D5W 500 MG/100ML IV SOLN
500.0000 mg | INTRAVENOUS | Status: DC
  Administered 2013-02-06 – 2013-02-10 (×5): 500 mg via INTRAVENOUS
  Filled 2013-02-06 (×5): qty 100

## 2013-02-06 MED ORDER — FUROSEMIDE 20 MG PO TABS
20.0000 mg | ORAL_TABLET | Freq: Every day | ORAL | Status: DC
  Administered 2013-02-06 – 2013-02-10 (×5): 20 mg via ORAL
  Filled 2013-02-06 (×7): qty 1

## 2013-02-06 MED ORDER — SIMVASTATIN 20 MG PO TABS
20.0000 mg | ORAL_TABLET | Freq: Every evening | ORAL | Status: DC
  Administered 2013-02-06 – 2013-02-15 (×10): 20 mg via ORAL
  Filled 2013-02-06 (×11): qty 1

## 2013-02-06 MED ORDER — INFLUENZA VIRUS VACC SPLIT PF IM SUSP
0.5000 mL | INTRAMUSCULAR | Status: AC
  Administered 2013-02-07: 0.5 mL via INTRAMUSCULAR
  Filled 2013-02-06 (×2): qty 0.5

## 2013-02-06 MED ORDER — ALBUTEROL SULFATE (5 MG/ML) 0.5% IN NEBU
2.5000 mg | INHALATION_SOLUTION | RESPIRATORY_TRACT | Status: DC | PRN
  Administered 2013-02-06 – 2013-02-07 (×6): 2.5 mg via RESPIRATORY_TRACT
  Filled 2013-02-06 (×7): qty 0.5

## 2013-02-06 MED ORDER — BUDESONIDE 0.25 MG/2ML IN SUSP
0.2500 mg | Freq: Two times a day (BID) | RESPIRATORY_TRACT | Status: DC
  Administered 2013-02-06 – 2013-02-07 (×3): 0.25 mg via RESPIRATORY_TRACT
  Filled 2013-02-06 (×4): qty 2

## 2013-02-06 MED ORDER — PANTOPRAZOLE SODIUM 40 MG PO TBEC
40.0000 mg | DELAYED_RELEASE_TABLET | Freq: Every day | ORAL | Status: DC
  Administered 2013-02-06 – 2013-02-16 (×11): 40 mg via ORAL
  Filled 2013-02-06 (×13): qty 1

## 2013-02-06 MED ORDER — ONDANSETRON HCL 4 MG PO TABS: 4.0000 mg | ORAL_TABLET | Freq: Four times a day (QID) | ORAL | Status: DC | PRN

## 2013-02-06 MED ORDER — ASPIRIN 325 MG PO TABS
650.0000 mg | ORAL_TABLET | Freq: Every morning | ORAL | Status: DC
  Administered 2013-02-06 – 2013-02-09 (×4): 650 mg via ORAL
  Filled 2013-02-06 (×4): qty 2

## 2013-02-06 MED ORDER — ACETAMINOPHEN 325 MG PO TABS
650.0000 mg | ORAL_TABLET | Freq: Once | ORAL | Status: AC
  Administered 2013-02-06: 650 mg via ORAL
  Filled 2013-02-06: qty 2

## 2013-02-06 MED ORDER — PREDNISONE 20 MG PO TABS
40.0000 mg | ORAL_TABLET | Freq: Every day | ORAL | Status: DC
  Administered 2013-02-06: 40 mg via ORAL
  Filled 2013-02-06 (×2): qty 2

## 2013-02-06 MED ORDER — IPRATROPIUM BROMIDE 0.02 % IN SOLN
0.5000 mg | RESPIRATORY_TRACT | Status: DC
  Administered 2013-02-06 – 2013-02-07 (×8): 0.5 mg via RESPIRATORY_TRACT
  Filled 2013-02-06 (×8): qty 2.5

## 2013-02-06 MED ORDER — ALBUTEROL SULFATE (5 MG/ML) 0.5% IN NEBU
2.5000 mg | INHALATION_SOLUTION | RESPIRATORY_TRACT | Status: DC
  Administered 2013-02-06 – 2013-02-07 (×7): 2.5 mg via RESPIRATORY_TRACT
  Filled 2013-02-06 (×7): qty 0.5

## 2013-02-06 NOTE — ED Notes (Signed)
Pt's O2 dropped to 88% on 2L.  Pt put on 3L and now has improved to 90%.

## 2013-02-06 NOTE — ED Notes (Signed)
Pt's daughter, Christinia Gully: [phone].

## 2013-02-06 NOTE — Progress Notes (Signed)
Pt c/o of Rt. Hip and lower back pain, he refuses anything for pain. Wheezing in all lobes. O2 via [place] @ 4L. PCP neg for flu, removed  from droplet precautions.

## 2013-02-06 NOTE — ED Notes (Signed)
Computer states the room is cleaned for pt but called [facility] to confirm.  Informed by unit that room is still not ready.  Unit will call when room is ready.

## 2013-02-06 NOTE — ED Notes (Signed)
Called to [facility] [place] to see status of room.  Room still not cleaned.  AC and Charge RN made aware.

## 2013-02-06 NOTE — ED Notes (Signed)
Waiting for bed to be ready for pt to go upstair.s

## 2013-02-06 NOTE — ED Notes (Signed)
MD made aware of pt O2 increase to 3L.

## 2013-02-06 NOTE — ED Notes (Signed)
Pt reports of a headache and desire to eat and drink. Pt's lungs still have a little wheeze but less restricted.  Pt reports he feels bad immediately after being taken off the neb tx. Dr. Effie Shy made aware and allowed pt to eat and drink.

## 2013-02-06 NOTE — Progress Notes (Signed)
I have seen and assessed patient and agree with Dr. Marigene Ehlers assessment and plan. Patient still with wheezing and poor air movement. Patient speaking in full sentences. Will check ABG. Will change to oral prednisone to IV Solu-Medrol. Continue nebs. Will add a flutter valve. Will add Mucinex. Influenza PCR is negative. Will discontinue Tamiflu. Follow. If no significant improvement may consider pulmonary consult. Patient follows with Dr. Delton Coombes in the clinic.

## 2013-02-06 NOTE — H&P (Signed)
[facility] History and Physical  [name] ZOX:[id number] DOB: Sep 16, 1951    PCP:  Dr Levy Pupa.  Chief Complaint: Increase shortness of breath and cough.  HPI: [name] is an 62 y.o. male with hx of severe COPD, on chronic steroid, active smoker, HTN, Hyperlipidemia and CAD, presents to the ER with increase shortness of breath and wheezing.  He has been using his nebs more frequently.  He has been on Pred 10mg  /day for over 6 weeks.  He did have some green sputum productive cough and denied any chest pain.  Evaluation in the ER included normal WBC, Hb, renal fx tests and electrolytes.  He has a CXR showing COPD, but no infiltrate.  He admitted to having myalgia for the past 4 days. He did have some blood tingued sputum with severe coughs.  He didn't have his flu shot this year, but had Pneumovax within the past five years.  He received nebs three times, and hospitalist was asked to admit him for COPD exacerbation.    Rewiew of Systems:  Constitutional: Negative for malaise, fever and chills. No significant weight loss or weight gain Eyes: Negative for eye pain, redness and discharge, diplopia, visual changes, or flashes of light. ENMT: Negative for ear pain, hoarseness, nasal congestion, sinus pressure and sore throat. No headaches; tinnitus, drooling, or problem swallowing. Cardiovascular: Negative for chest pain, palpitations, diaphoresis,  and peripheral edema. ; No orthopnea, PND Respiratory: Negative for cough,  No pleuritic chestpain. Gastrointestinal: Negative for nausea, vomiting, diarrhea, constipation, abdominal pain, melena, blood in stool, hematemesis, jaundice and rectal bleeding.    Genitourinary: Negative for frequency, dysuria, incontinence,flank pain and hematuria; Musculoskeletal: Negative for back pain and neck pain. Negative for swelling and trauma.;  Skin: . Negative for pruritus, rash, abrasions, bruising and skin lesion.; ulcerations Neuro: Negative  for headache, lightheadedness and neck stiffness. Negative for weakness, altered level of consciousness , altered mental status, extremity weakness, burning feet, involuntary movement, seizure and syncope.  Psych: negative for anxiety, depression, insomnia, tearfulness, panic attacks, hallucinations, paranoia, suicidal or homicidal ideation.   Past Medical History  Diagnosis Date  . COPD (chronic obstructive pulmonary disease)   . CAD (coronary artery disease)   . Hypertension   . Hyperlipidemia   . Osteomyelitis     Past Surgical History  Procedure Laterality Date  . Appendectomy      Medications:  HOME MEDS: Prior to Admission medications   Medication Sig Start Date End Date Taking? Authorizing Provider  albuterol (PROVENTIL) (2.5 MG/3ML) 0.083% nebulizer solution Take 3 mLs (2.5 mg total) by nebulization every 6 (six) hours as needed for wheezing (DX:  496). 03/21/12 03/21/13 Yes Leslye Peer, MD  aspirin 325 MG tablet Take 650 mg by mouth every morning.    Yes Historical Provider, MD  budesonide (PULMICORT) 0.25 MG/2ML nebulizer solution Take 2 mLs (0.25 mg total) by nebulization 2 (two) times daily. 11/24/12 11/24/13 Yes Leslye Peer, MD  clopidogrel (PLAVIX) 75 MG tablet Take 75 mg by mouth daily.     Yes Historical Provider, MD  furosemide (LASIX) 20 MG tablet Take 1 tablet (20 mg total) by mouth daily. 07/31/12  Yes Leslye Peer, MD  ipratropium (ATROVENT) 0.02 % nebulizer solution Take 2.5 mLs (500 mcg total) by nebulization 4 (four) times daily. 11/24/12  Yes Leslye Peer, MD  predniSONE (DELTASONE) 10 MG tablet Take 1 tablet (10 mg total) by mouth daily. 12/27/12  Yes Leslye Peer, MD  simvastatin (  ZOCOR) 20 MG tablet Take 1 tablet (20 mg total) by mouth every evening. 07/31/12  Yes Leslye Peer, MD     Allergies:  Allergies  Allergen Reactions  . Lipitor (Atorvastatin Calcium)     Muscle aches    Social History:   reports that he has been smoking Cigarettes.  He  has a 12 pack-year smoking history. He has never used smokeless tobacco. He reports that he does not drink alcohol or use illicit drugs.  Family History: Family History  Problem Relation Age of Onset  . Coronary artery disease    . Hypertension       Physical Exam: Filed Vitals:   02/05/13 2315 02/06/13 0003 02/06/13 0015 02/06/13 0044  BP:  162/85    Pulse: 81 102 99 103  Temp:  98.4 F (36.9 C)    TempSrc:  Oral    Resp: 14  15 22   SpO2: 95% 90% 90% 89%   Blood pressure 162/85, pulse 103, temperature 98.4 F (36.9 C), temperature source Oral, resp. rate 22, SpO2 89.00%.  GEN:  Pleasant  patient lying in the stretcher in no acute distress; cooperative with exam. Having moderate wheezing. PSYCH:  alert and oriented x4; does not appear anxious or depressed; affect is appropriate. HEENT: Mucous membranes pink and anicteric; PERRLA; EOM intact; no cervical lymphadenopathy nor thyromegaly or carotid bruit; no JVD; There were no stridor. Neck is very supple. Breasts:: Not examined CHEST WALL: No tenderness CHEST: Bilateral decrease in his BS, but no rales.  He has tight bilateral wheezes. HEART: Regular rate and rhythm.  There are no murmur, rub, or gallops.   BACK: No kyphosis or scoliosis; no CVA tenderness ABDOMEN: soft and non-tender; no masses, no organomegaly, normal abdominal bowel sounds; no pannus; no intertriginous candida. There is no rebound and no distention. Rectal Exam: Not done EXTREMITIES: No bone or joint deformity; age-appropriate arthropathy of the hands and knees; no edema; no ulcerations.  There is no calf tenderness. Genitalia: not examined PULSES: 2+ and symmetric SKIN: Normal hydration no rash or ulceration CNS: Cranial nerves 2-12 grossly intact no focal lateralizing neurologic deficit.  Speech is fluent; uvula elevated with phonation, facial symmetry and tongue midline. DTR are normal bilaterally, cerebella exam is intact, barbinski is negative and  strengths are equaled bilaterally.  No sensory loss.   Labs on Admission:  Basic Metabolic Panel:  Recent Labs Lab 02/05/13 2247  NA 138  K 3.9  CL 98  CO2 29  GLUCOSE 74  BUN 12  CREATININE 0.81  CALCIUM 9.1   Liver Function Tests: No results found for this basename: AST, ALT, ALKPHOS, BILITOT, PROT, ALBUMIN,  in the last 168 hours No results found for this basename: LIPASE, AMYLASE,  in the last 168 hours No results found for this basename: AMMONIA,  in the last 168 hours CBC:  Recent Labs Lab 02/05/13 2247  WBC 6.0  NEUTROABS 4.5  HGB 14.8  HCT 44.2  MCV 87.7  PLT 157   Cardiac Enzymes: No results found for this basename: CKTOTAL, CKMB, CKMBINDEX, TROPONINI,  in the last 168 hours  CBG: No results found for this basename: GLUCAP,  in the last 168 hours   Radiological Exams on Admission: Dg Chest 2 View  02/05/2013  *RADIOLOGY REPORT*  Clinical Data: Short of breath and wheezing  CHEST - 2 VIEW  Comparison: 01/12/2012  Findings: Mild hyperinflation with changes of COPD.  Negative for pneumonia.  Negative for heart failure or effusion.  Negative for mass lesion.  IMPRESSION: COPD without acute abnormality.   Original Report Authenticated By: Janeece Riggers, M.D.      Assessment/Plan Present on Admission:  . COPD . HYPERTENSION . TOBACCO USER . HYPERLIPIDEMIA . Steroid-dependent chronic obstructive pulmonary disease . CAD  PLAN:  Will admit him for COPD exacerbation.  I will increase his PO prednisone to 40mg  per day.  Since he has been on steroids, no advantage giving him IV steroid.  He will need antibiotic for inpatient COPD exacebation, so will give him Levaquin.  He is stable, full code, and will be admitted to [facility] service.  I have low clinical suspicion, but it is reasonable to give Tamiflu and check influenza PCR.  I have continued his home meds and added nebs treatments.  Thank you for allowing me to partake in the care of your patient.  Other plans as  per orders.  Code Status: FULL Unk Lightning, MD. [facility] Pager [phone] 7pm to 7am.  02/06/2013, 1:20 AM

## 2013-02-07 ENCOUNTER — Inpatient Hospital Stay (HOSPITAL_COMMUNITY): Payer: Medicare Other

## 2013-02-07 DIAGNOSIS — J9621 Acute and chronic respiratory failure with hypoxia: Secondary | ICD-10-CM | POA: Diagnosis present

## 2013-02-07 DIAGNOSIS — I2510 Atherosclerotic heart disease of native coronary artery without angina pectoris: Secondary | ICD-10-CM

## 2013-02-07 DIAGNOSIS — E785 Hyperlipidemia, unspecified: Secondary | ICD-10-CM

## 2013-02-07 DIAGNOSIS — J9600 Acute respiratory failure, unspecified whether with hypoxia or hypercapnia: Principal | ICD-10-CM

## 2013-02-07 DIAGNOSIS — R079 Chest pain, unspecified: Secondary | ICD-10-CM | POA: Diagnosis present

## 2013-02-07 LAB — BLOOD GAS, ARTERIAL
Acid-Base Excess: 3.2 mmol/L — ABNORMAL HIGH (ref 0.0–2.0)
Acid-Base Excess: 4.2 mmol/L — ABNORMAL HIGH (ref 0.0–2.0)
Bicarbonate: 30.2 mEq/L — ABNORMAL HIGH (ref 20.0–24.0)
Drawn by: 347861
Drawn by: 257701
FIO2: 1 %
O2 Content: 4 L/min
O2 Saturation: 97 %
O2 Saturation: 98.8 %
Patient temperature: 37
TCO2: 27 mmol/L (ref 0–100)
TCO2: 29.3 mmol/L (ref 0–100)
pCO2 arterial: 59 mmHg (ref 35.0–45.0)
pH, Arterial: 7.33 — ABNORMAL LOW (ref 7.350–7.450)
pO2, Arterial: 93 mmHg (ref 80.0–100.0)
pO2, Arterial: 163 mmHg — ABNORMAL HIGH (ref 80.0–100.0)

## 2013-02-07 LAB — BASIC METABOLIC PANEL
BUN: 15 mg/dL (ref 6–23)
Calcium: 9.2 mg/dL (ref 8.4–10.5)
Creatinine, Ser: 0.75 mg/dL (ref 0.50–1.35)
GFR calc non Af Amer: >90 mL/min (ref >90)
Glucose, Bld: 211 mg/dL — ABNORMAL HIGH (ref 70–99)
Sodium: 141 mEq/L (ref 135–145)

## 2013-02-07 LAB — STREP PNEUMONIAE URINARY ANTIGEN: Strep Pneumo Urinary Antigen: NEGATIVE

## 2013-02-07 LAB — CBC
HCT: 43.1 % (ref 39.0–52.0)
Hemoglobin: 14.4 g/dL (ref 13.0–17.0)
MCH: 29.6 pg (ref 26.0–34.0)
MCHC: 33.4 g/dL (ref 30.0–36.0)
MCV: 88.7 fL (ref 78.0–100.0)

## 2013-02-07 LAB — TROPONIN I
Troponin I: <0.3 ng/mL (ref <0.30)
Troponin I: <0.3 ng/mL (ref <0.30)

## 2013-02-07 LAB — MRSA PCR SCREENING: MRSA by PCR: NEGATIVE

## 2013-02-07 MED ORDER — OSELTAMIVIR PHOSPHATE 75 MG PO CAPS
75.0000 mg | ORAL_CAPSULE | Freq: Two times a day (BID) | ORAL | Status: DC
  Administered 2013-02-07 – 2013-02-08 (×3): 75 mg via ORAL
  Filled 2013-02-07 (×4): qty 1

## 2013-02-07 MED ORDER — LEVALBUTEROL HCL 0.63 MG/3ML IN NEBU
0.6300 mg | INHALATION_SOLUTION | RESPIRATORY_TRACT | Status: DC
  Administered 2013-02-07 – 2013-02-12 (×38): 0.63 mg via RESPIRATORY_TRACT
  Filled 2013-02-07 (×48): qty 3

## 2013-02-07 MED ORDER — ALBUTEROL SULFATE (5 MG/ML) 0.5% IN NEBU
5.0000 mg | INHALATION_SOLUTION | Freq: Once | RESPIRATORY_TRACT | Status: AC
  Administered 2013-02-07: 5 mg via RESPIRATORY_TRACT
  Filled 2013-02-07: qty 0.5

## 2013-02-07 MED ORDER — ALBUTEROL SULFATE (5 MG/ML) 0.5% IN NEBU: 2.5000 mg | INHALATION_SOLUTION | RESPIRATORY_TRACT | Status: DC

## 2013-02-07 MED ORDER — INSULIN ASPART 100 UNIT/ML ~~LOC~~ SOLN
0.0000 [IU] | Freq: Three times a day (TID) | SUBCUTANEOUS | Status: DC
  Administered 2013-02-07: 4 [IU] via SUBCUTANEOUS
  Administered 2013-02-08 – 2013-02-09 (×3): 3 [IU] via SUBCUTANEOUS
  Administered 2013-02-10 – 2013-02-11 (×4): 4 [IU] via SUBCUTANEOUS
  Administered 2013-02-11 – 2013-02-12 (×2): 3 [IU] via SUBCUTANEOUS
  Administered 2013-02-12: 4 [IU] via SUBCUTANEOUS
  Administered 2013-02-13: 3 [IU] via SUBCUTANEOUS
  Administered 2013-02-13 – 2013-02-14 (×3): 4 [IU] via SUBCUTANEOUS
  Administered 2013-02-15 (×2): 3 [IU] via SUBCUTANEOUS

## 2013-02-07 MED ORDER — LEVALBUTEROL HCL 0.63 MG/3ML IN NEBU
0.6300 mg | INHALATION_SOLUTION | RESPIRATORY_TRACT | Status: DC
  Administered 2013-02-07: 0.63 mg via RESPIRATORY_TRACT
  Filled 2013-02-07 (×6): qty 3

## 2013-02-07 MED ORDER — BUDESONIDE 0.25 MG/2ML IN SUSP
0.2500 mg | Freq: Four times a day (QID) | RESPIRATORY_TRACT | Status: DC
  Administered 2013-02-07 – 2013-02-13 (×23): 0.25 mg via RESPIRATORY_TRACT
  Filled 2013-02-07 (×27): qty 2

## 2013-02-07 MED ORDER — IPRATROPIUM BROMIDE 0.02 % IN SOLN
0.5000 mg | RESPIRATORY_TRACT | Status: DC | PRN
  Administered 2013-02-07 – 2013-02-09 (×3): 0.5 mg via RESPIRATORY_TRACT
  Filled 2013-02-07 (×6): qty 2.5

## 2013-02-07 MED ORDER — LEVALBUTEROL HCL 0.63 MG/3ML IN NEBU
0.6300 mg | INHALATION_SOLUTION | RESPIRATORY_TRACT | Status: DC | PRN
  Administered 2013-02-07 – 2013-02-16 (×12): 0.63 mg via RESPIRATORY_TRACT
  Filled 2013-02-07 (×4): qty 3

## 2013-02-07 MED ORDER — IPRATROPIUM BROMIDE 0.02 % IN SOLN
0.5000 mg | Freq: Four times a day (QID) | RESPIRATORY_TRACT | Status: DC
  Administered 2013-02-07 – 2013-02-08 (×3): 0.5 mg via RESPIRATORY_TRACT
  Filled 2013-02-07 (×3): qty 2.5

## 2013-02-07 MED ORDER — FUROSEMIDE 10 MG/ML IJ SOLN
40.0000 mg | Freq: Once | INTRAMUSCULAR | Status: AC
  Administered 2013-02-07: 40 mg via INTRAVENOUS
  Filled 2013-02-07: qty 4

## 2013-02-07 MED ORDER — INSULIN ASPART 100 UNIT/ML ~~LOC~~ SOLN: 0.0000 [IU] | Freq: Every day | SUBCUTANEOUS | Status: DC

## 2013-02-07 MED ORDER — ALBUTEROL SULFATE (5 MG/ML) 0.5% IN NEBU: 2.5000 mg | INHALATION_SOLUTION | Freq: Once | RESPIRATORY_TRACT | Status: DC

## 2013-02-07 MED ORDER — LORAZEPAM 2 MG/ML IJ SOLN
1.0000 mg | Freq: Once | INTRAMUSCULAR | Status: AC
  Administered 2013-02-07: 1 mg via INTRAVENOUS
  Filled 2013-02-07: qty 1

## 2013-02-07 MED ORDER — OXYCODONE-ACETAMINOPHEN 5-325 MG PO TABS
1.0000 | ORAL_TABLET | Freq: Four times a day (QID) | ORAL | Status: DC | PRN
  Administered 2013-02-07 (×2): 1 via ORAL
  Administered 2013-02-08: 2 via ORAL
  Administered 2013-02-09: 1 via ORAL
  Administered 2013-02-09: 2 via ORAL
  Administered 2013-02-09: 1 via ORAL
  Administered 2013-02-10 – 2013-02-11 (×2): 2 via ORAL
  Administered 2013-02-11: 1 via ORAL
  Administered 2013-02-11 – 2013-02-16 (×9): 2 via ORAL
  Filled 2013-02-07 (×2): qty 2
  Filled 2013-02-07: qty 1
  Filled 2013-02-07 (×4): qty 2
  Filled 2013-02-07 (×2): qty 1
  Filled 2013-02-07: qty 2
  Filled 2013-02-07: qty 1
  Filled 2013-02-07 (×6): qty 2
  Filled 2013-02-07: qty 1
  Filled 2013-02-07: qty 2

## 2013-02-07 NOTE — Progress Notes (Signed)
Notified provider on call [name] about pt receiving Q2 hr respiratory treatments and pt audibly wheezing from the door with no improvement.  Pt can speak [name] sentence and then has to catch his breath.  New orders given for repeat ABG.  [name], RT notified. Will continue to monitor. [name]

## 2013-02-07 NOTE — Progress Notes (Signed)
CRITICAL VALUE ALERT  Critical value received:  PCO2 - 59  Date of notification:  02/07/13  Time of notification:  0521  Critical value read back:yes  Nurse who received alert:  Fatima Sanger, RN  MD notified (1st page):  Merdis Delay, NP  Time of first page:  0522  MD notified (2nd page):  Time of second page:  Responding MD:  Merdis Delay, NP  Time MD responded:  [phone]

## 2013-02-07 NOTE — Progress Notes (Signed)
  Echocardiogram Echocardiogram has been performed.  Cathie Beams 02/07/2013, 3:25 PM

## 2013-02-07 NOTE — Consult Note (Signed)
PULMONARY  / CRITICAL CARE MEDICINE  Name: [name] MRN: [id number] DOB: 1951-10-08    ADMISSION DATE:  02/05/2013 CONSULTATION DATE:  02/07/2013  REFERRING MD :  Elisabeth Pigeon   CHIEF COMPLAINT:  SOB/ Respiratory failure  BRIEF PATIENT DESCRIPTION:  62 y.o. male with hx of severe COPD and is on chronic steroids. He is an active smoker with h/o HTN, Hyperlipidemia and CAD. Presented 2/17 with increase shortness of breath and wheezing, despite increase nebulizer use.  He was admitted and started on Levaquin. Symptoms continued to exacerbate until early 2/18 when he was unable to catch his breath and was receiving respiratory treatments q2hours and he became acidotic. He was then transferred to ICU for critical care evaluation.    SIGNIFICANT EVENTS / STUDIES:    LINES / TUBES:   CULTURES: BCx2 >>> Flu PCR (2/18) -  Neg  ANTIBIOTICS: Levaquin 2/18>>>  HISTORY OF PRESENT ILLNESS:  62 y.o. male with hx of severe COPD, on chronic steroid, active smoker, HTN, Hyperlipidemia and CAD, presented to the ER 2/17 with increase shortness of breath and wheezing, despite increased neb use. He saw Dr. Delton Coombes in the office on 1/8 and was started on oral prednisone for severe COPD with frequent exacerbations. He has been on Pred 10mg  /day for over 6 weeks. He did have some green sputum productive cough and denied any chest pain. He states that symptoms began on 2/16 with sinus congestion, cough and, mild shortness of breath. By 2/17 SOB had worsened and he presented to the ED. He was started on levaquin, tamiflu, and mucinex. His PO steroids were changed to IV. Tamiflu was discontinued once flu PCR resulted negative. His condition continued to worsen, 2/19 in the morning dyspnea worsened and he was unable to speak in full sentences. He was requiring q2hour nebulizer treatments. He was transferred to the ICU and a PCCM consult was requested. Currently he complains of dyspnea and intermittent pleuritic chest pain  while on 100% NRB. He reports a cough with light brown mucous production and one known sick contact recently who had the flu. His work of breathing in still increased on 100% FiO2.  PAST MEDICAL HISTORY :  Past Medical History  Diagnosis Date  . COPD (chronic obstructive pulmonary disease)   . CAD (coronary artery disease)   . Hypertension   . Hyperlipidemia   . Osteomyelitis    Past Surgical History  Procedure Laterality Date  . Appendectomy     Prior to Admission medications   Medication Sig Start Date End Date Taking? Authorizing Provider  albuterol (PROVENTIL) (2.5 MG/3ML) 0.083% nebulizer solution Take 3 mLs (2.5 mg total) by nebulization every 6 (six) hours as needed for wheezing (DX:  496). 03/21/12 03/21/13 Yes Leslye Peer, MD  aspirin 325 MG tablet Take 650 mg by mouth every morning.    Yes Historical Provider, MD  budesonide (PULMICORT) 0.25 MG/2ML nebulizer solution Take 2 mLs (0.25 mg total) by nebulization 2 (two) times daily. 11/24/12 11/24/13 Yes Leslye Peer, MD  clopidogrel (PLAVIX) 75 MG tablet Take 75 mg by mouth daily.     Yes Historical Provider, MD  furosemide (LASIX) 20 MG tablet Take 1 tablet (20 mg total) by mouth daily. 07/31/12  Yes Leslye Peer, MD  ipratropium (ATROVENT) 0.02 % nebulizer solution Take 2.5 mLs (500 mcg total) by nebulization 4 (four) times daily. 11/24/12  Yes Leslye Peer, MD  predniSONE (DELTASONE) 10 MG tablet Take 1 tablet (10 mg total) by mouth  daily. 12/27/12  Yes Leslye Peer, MD  simvastatin (ZOCOR) 20 MG tablet Take 1 tablet (20 mg total) by mouth every evening. 07/31/12  Yes Leslye Peer, MD   Allergies  Allergen Reactions  . Lipitor (Atorvastatin Calcium)     Muscle aches    FAMILY HISTORY:  Family History  Problem Relation Age of Onset  . Coronary artery disease    . Hypertension     SOCIAL HISTORY:  reports that he has been smoking Cigarettes.  He has a 12 pack-year smoking history. He has never used smokeless tobacco.  He reports that he does not drink alcohol or use illicit drugs.  REVIEW OF SYSTEMS:  Unable due to dyspnea  SUBJECTIVE:   VITAL SIGNS: Temp:  [97.5 F (36.4 C)-98 F (36.7 C)] 97.5 F (36.4 C) (02/19 0533) Pulse Rate:  [73-95] 92 (02/19 0533) Resp:  [16-20] 16 (02/19 0533) BP: (129-146)/(70-77) 146/77 mmHg (02/19 0533) SpO2:  [85 %-99 %] 85 % (02/19 1111) FiO2 (%):  [100 %] 100 % (02/19 1111) HEMODYNAMICS:   VENTILATOR SETTINGS: Vent Mode:  [-]  FiO2 (%):  [100 %] 100 % INTAKE / OUTPUT: Intake/Output     02/18 0701 - 02/19 0700 02/19 0701 - 02/20 0700   P.O. 1080    Total Intake(mL/kg) 1080 (12.4)    Urine (mL/kg/hr) 1700 (0.8) 400 (0.9)   Total Output 1700 400   Net -620 -400          PHYSICAL EXAMINATION: General:  Acutely ill-appearing male in moderate respiratory distress.  Neuro:  A&Ox4, answers questions appropriately. No Focal deficits.  HEENT:  PERRL, neck supple, no JVD.  Cardiovascular:  S1 S2 intact, no rub, gallop, murmur. Lungs:  Upper airway expiratory wheezes heard throughout.  Abdomen:  Soft, non-tender, non-distended. Musculoskeletal:  Intact  Skin:  Intact  LABS:  Recent Labs Lab 02/05/13 2247 02/06/13 0510 02/06/13 1711 02/07/13 0333 02/07/13 0507 02/07/13 1025 02/07/13 1055  HGB 14.8 14.4  --  14.4  --   --   --   WBC 6.0 7.8  --  5.9  --   --   --   PLT 157 154  --  160  --   --   --   NA 138  --   --  141  --   --   --   K 3.9  --   --  4.4  --   --   --   CL 98  --   --  100  --   --   --   CO2 29  --   --  29  --   --   --   GLUCOSE 74  --   --  211*  --   --   --   BUN 12  --   --  15  --   --   --   CREATININE 0.81 0.87  --  0.75  --   --   --   CALCIUM 9.1  --   --  9.2  --   --   --   TROPONINI  --   --   --   --   --  <0.30  --   PROBNP  --   --   --   --   --  192.9*  --   PHART  --   --  7.372  --  7.330*  --  7.293*  PCO2ART  --   --  55.5*  --  59.0*  --  70.5*  PO2ART  --   --  87.9  --  93.0  --  163.0*   No  results found for this basename: GLUCAP,  in the last 168 hours  CXR: 2/18 - decreased aeration and hyperinflation bilaterally.  ASSESSMENT / PLAN:  PULMONARY A:  1)Acute Respiratory Failure - likely cause is reactive airway/copd exacerbation in the setting of acute upper respiratory infection. Possible that pulmonary edema or flu may play a role.  2)Acute Exacerbation COPD P:   1) Initiate BiPap 2) Bronchodilators 3) IV steroids 4) Diurese as tolerated 5) Check flu  CARDIOVASCULAR A: History of CAD P:  1) Check BNP 2) Supportive Care 3) F/u ECHO  RENAL A:  No acute issue P:   Supportive Care  GASTROINTESTINAL A:  No Acute Issue P:   GI prophylaxis Supportive Care   INFECTIOUS A:  Purulent Bronchitis r/o CAP P:   1) empiric Levaquin 2) restart Tamiflu pending viral panel results 3) f/u CXR  ENDOCRINE A: Hyperglycemia likely steroid induced P:   SSI  NEUROLOGIC A:  No Acute Issue P:   Supportive Care  TODAY'S SUMMARY:  I have personally obtained a history, examined the patient, evaluated laboratory and imaging results, formulated the assessment and plan and placed orders. CRITICAL CARE: The patient is critically ill with multiple organ systems failure and requires high complexity decision making for assessment and support, frequent evaluation and titration of therapies, application of advanced monitoring technologies and extensive interpretation of multiple databases. Critical Care Time devoted to patient care services described in this note is - 35 min  Dr. Kalman Shan, M.D., [facility].C.P Pulmonary and Critical Care Medicine Staff Physician [facility] Pager: [phone], If no answer or between  15:00h - 7:00h: call [phone]  02/07/2013 6:09 PM

## 2013-02-07 NOTE — Progress Notes (Signed)
eLink Physician-Brief Progress Note Patient Name: [name] DOB: 1951-01-24 MRN: [id number]  Date of Service  02/07/2013   HPI/Events of Note   resp distress, placed back on BiPAP this pm. Now with associated anxiety, asking for nebs q1h  eICU Interventions  Try ativan x 1, follow overall status, hopefully will be able to avoid intubation   Intervention Category Minor Interventions: Agitation / anxiety - evaluation and management  Sary Bogie S. 02/07/2013, 8:36 PM

## 2013-02-07 NOTE — Progress Notes (Addendum)
[facility] PROGRESS NOTE  [name] ZOX:[id number] DOB: January 04, 1951 DOA: 02/05/2013 PCP: No primary provider on file.  Brief narrative: 62 y.o. male with past medical history of COPD, on chronic steroids (prednisone 10 mg a day for over 6 weeks), active smoker, hypertension and dyslipidemia, coronary artery disease status post stent placement who presented 02/05/2013 for worsening shortness of breath and wheezing for few days prior to this admission. Patient also reported occasional productive cough. Patient also reported subjective fevers at home. In emergency room, evaluation included chest x-ray which showed COPD. His blood work was essentially unremarkable. Patient was started on a nebulizer treatments and steroids. This morning patient appear to be in more respiratory distress even after nebulizer treatments which prompted the transfer to step down unit. He is ABG revealed CO2 of 71 which is up from admission CO2 of 56.  Assessment/Plan:  Principal Problem:   Acute respiratory failure with hypercarbia  CO2 retention secondary to acute/chronic COPD exacerbation  Repeat chest x-ray shows no acute cardiopulmonary process  We have added Atrovent to nebulizer treatments and changed albuterol to Xopenex. Nebulizer treatments are every 4 hours scheduled and every 2 hours as needed.  Continue Solu-Medrol 80 mg every 6 hours  Added Levaquin for possible community acquired pneumonia  Added BiPAP Active Problems:   Severe COPD with acute exacerbation  Management as above with nebulizer treatments, steroids and antibiotics Chest pain  Likely musculoskeletal but we will cycle cardiac enzymes to make sure no acute coronary syndrome considering patient's history of CAD and stent placement    Check 2-D echo and BNP HYPERLIPIDEMIA  Continue simvastatin 20 mg at bedtime   TOBACCO USER  Counseled on smoking cessation   HYPERTENSION  Lasix 20 mg daily   CAD  Continue Aspirin  and Plavix  Code Status: Full code Family Communication: Updated family at bedside Disposition Plan: Transfer to step down unit  Manson Passey, MD  [facility] Pager [phone]  If 7PM-7AM, please contact night-coverage [email or web address] Password [email or web address] 02/07/2013, 12:02 PM   LOS: 2 days   Consultants:  PCCM  Procedures:  None  Antibiotics:  Levaquin 02/07/2013 -->  HPI/Subjective: Patient in respiratory distress this morning.  Objective: Filed Vitals:   02/07/13 0533 02/07/13 0541 02/07/13 0744 02/07/13 1111  BP: 146/77     Pulse: 92     Temp: 97.5 F (36.4 C)     TempSrc: Oral     Resp: 16     Height:      Weight:      SpO2: 96% 96% 97% 85%    Intake/Output Summary (Last 24 hours) at 02/07/13 1202 Last data filed at 02/07/13 8295  Gross per 24 hour  Intake    600 ml  Output   1800 ml  Net  -1200 ml    Exam:   General:  Pt is alert, follows commands appropriately, not in acute distress  Cardiovascular: Regular rate and rhythm, S1/S2, no murmurs, no rubs, no gallops  Respiratory: Wheezing over upper and mid lung lobes bilaterally, no rhonchi or rales  Abdomen: Soft, non tender, non distended, bowel sounds present, no guarding  Extremities: No edema, pulses DP and PT palpable bilaterally  Neuro: Grossly nonfocal  Data Reviewed: Basic Metabolic Panel:  Recent Labs Lab 02/05/13 2247 02/06/13 0510 02/07/13 0333  NA 138  --  141  K 3.9  --  4.4  CL 98  --  100  CO2 29  --  29  GLUCOSE 74  --  211*  BUN 12  --  15  CREATININE 0.81 0.87 0.75  CALCIUM 9.1  --  9.2   CBC:  Recent Labs Lab 02/05/13 2247 02/06/13 0510 02/07/13 0333  WBC 6.0 7.8 5.9  NEUTROABS 4.5  --   --   HGB 14.8 14.4 14.4  HCT 44.2 43.2 43.1  MCV 87.7 88.7 88.7  PLT 157 154 160   Cardiac Enzymes:  Recent Labs Lab 02/07/13 1025  TROPONINI <0.30   BNP: No components found with this basename: POCBNP,  CBG: No results found for this basename: GLUCAP,  in the last 168  hours  No results found for this or any previous visit (from the past 240 hour(s)).   Studies: Dg Chest 2 View 02/05/2013  *  IMPRESSION: COPD without acute abnormality.   Original Report Authenticated By: Janeece Riggers, M.D.    Dg Chest Port 1 View 02/07/2013  * IMPRESSION: No acute findings.   Original Report Authenticated By: Leanna Battles, M.D.     Scheduled Meds: . aspirin  650 mg Oral q morning - 10a  . budesonide  0.25 mg Nebulization BID  . clopidogrel  75 mg Oral Daily  . docusate sodium  100 mg Oral BID  . enoxaparin (LOVENOX)   40 mg Subcutaneous Q24H  . furosemide  40 mg Intravenous Once  . furosemide  20 mg Oral Daily  . guaiFENesin  1,200 mg Oral BID  . ipratropium  0.5 mg Nebulization Q4H  . levalbuterol  0.63 mg Nebulization Q4H  . levofloxacin (LEVAQUIN)   500 mg Intravenous Q24H  . methylPREDNISolone   80 mg Intravenous Q6H  . pantoprazole  40 mg Oral Daily  . simvastatin  20 mg Oral QPM

## 2013-02-08 ENCOUNTER — Inpatient Hospital Stay (HOSPITAL_COMMUNITY): Payer: Medicare Other

## 2013-02-08 LAB — BLOOD GAS, ARTERIAL
Delivery systems: POSITIVE
Expiratory PAP: 5
FIO2: 0.3 %
Mode: POSITIVE
O2 Saturation: 96 %
Patient temperature: 98.6
TCO2: 31.4 mmol/L (ref 0–100)
pH, Arterial: 7.321 — ABNORMAL LOW (ref 7.350–7.450)

## 2013-02-08 LAB — RESPIRATORY VIRUS PANEL
Adenovirus: NOT DETECTED
Metapneumovirus: NOT DETECTED
Parainfluenza 2: NOT DETECTED
Parainfluenza 3: NOT DETECTED
Respiratory Syncytial Virus B: DETECTED — AB
Rhinovirus: NOT DETECTED

## 2013-02-08 LAB — BASIC METABOLIC PANEL
BUN: 20 mg/dL (ref 6–23)
CO2: 35 mEq/L — ABNORMAL HIGH (ref 19–32)
Chloride: 96 mEq/L (ref 96–112)
Creatinine, Ser: 0.79 mg/dL (ref 0.50–1.35)
Glucose, Bld: 149 mg/dL — ABNORMAL HIGH (ref 70–99)
Potassium: 4 mEq/L (ref 3.5–5.1)

## 2013-02-08 LAB — GLUCOSE, CAPILLARY
Glucose-Capillary: 119 mg/dL — ABNORMAL HIGH (ref 70–99)
Glucose-Capillary: 135 mg/dL — ABNORMAL HIGH (ref 70–99)
Glucose-Capillary: 151 mg/dL — ABNORMAL HIGH (ref 70–99)

## 2013-02-08 LAB — CBC
HCT: 44.9 % (ref 39.0–52.0)
Hemoglobin: 14.5 g/dL (ref 13.0–17.0)
MCH: 29.1 pg (ref 26.0–34.0)
MCHC: 32.3 g/dL (ref 30.0–36.0)
MCV: 90 fL (ref 78.0–100.0)
RDW: 14.1 % (ref 11.5–15.5)

## 2013-02-08 LAB — LEGIONELLA ANTIGEN, URINE: Legionella Antigen, Urine: NEGATIVE

## 2013-02-08 MED ORDER — ISOSORBIDE MONONITRATE ER 30 MG PO TB24
30.0000 mg | ORAL_TABLET | Freq: Every day | ORAL | Status: DC
  Administered 2013-02-08 – 2013-02-16 (×9): 30 mg via ORAL
  Filled 2013-02-08 (×9): qty 1

## 2013-02-08 MED ORDER — MAGNESIUM SULFATE 50 % IJ SOLN: 2.0000 g | Freq: Once | INTRAVENOUS | Status: DC

## 2013-02-08 MED ORDER — MORPHINE SULFATE 2 MG/ML IJ SOLN
INTRAMUSCULAR | Status: AC
  Filled 2013-02-08: qty 1

## 2013-02-08 MED ORDER — HYDRALAZINE HCL 25 MG PO TABS
25.0000 mg | ORAL_TABLET | Freq: Three times a day (TID) | ORAL | Status: DC
  Administered 2013-02-08 – 2013-02-16 (×24): 25 mg via ORAL
  Filled 2013-02-08 (×28): qty 1

## 2013-02-08 MED ORDER — IPRATROPIUM BROMIDE 0.02 % IN SOLN
0.5000 mg | RESPIRATORY_TRACT | Status: DC
  Administered 2013-02-08 – 2013-02-12 (×32): 0.5 mg via RESPIRATORY_TRACT
  Filled 2013-02-08 (×28): qty 2.5

## 2013-02-08 MED ORDER — MORPHINE SULFATE 2 MG/ML IJ SOLN
1.0000 mg | INTRAMUSCULAR | Status: DC | PRN
  Administered 2013-02-08 – 2013-02-11 (×13): 2 mg via INTRAVENOUS
  Filled 2013-02-08 (×11): qty 1

## 2013-02-08 MED ORDER — MAGNESIUM SULFATE 40 MG/ML IJ SOLN
2.0000 g | Freq: Once | INTRAMUSCULAR | Status: AC
  Administered 2013-02-08: 2 g via INTRAVENOUS
  Filled 2013-02-08: qty 50

## 2013-02-08 NOTE — Progress Notes (Signed)
Inpatient Diabetes Program Recommendations  AACE/ADA: New Consensus Statement on Inpatient Glycemic Control (2013)  Target Ranges:  Prepandial:   less than 140 mg/dL      Peak postprandial:   less than 180 mg/dL (1-2 hours)      Critically ill patients:  140 - 180 mg/dL   Reason for Visit: Hyperglycemia  62 y.o. male with hx of severe COPD, on chronic steroid, active smoker, HTN, Hyperlipidemia and CAD, presents to the ER with increase shortness of breath and wheezing. On Prednisone 10 mg QD at home.  Has Solumedrol 80 Q6 ordered.  No hx DM.  Results for GERIK, COBERLY (MRN [id number]) as of 02/08/2013 11:33  Ref. Range 02/07/2013 17:29 02/07/2013 21:58 02/08/2013 05:12 02/08/2013 07:47  Glucose-Capillary Latest Range: 70-99 mg/dL 413 (H) 244 (H) 010 (H) 114 (H)   Inpatient Diabetes Program Recommendations HgbA1C: Check HgbA1C to assess glycemic control prior to hospitalization Diet: Add CHO mod med to heart healthy diet  Note: Steroid-induced hyperglycemia.  Last HgbA1C was 5.3% on 07/25/2010.  Will follow.  Thank you. Ailene Ards, RD, LDN, CDE Inpatient Diabetes Coordinator [phone]

## 2013-02-08 NOTE — Progress Notes (Signed)
eLink Physician-Brief Progress Note Patient Name: [name] DOB: 1951/11/12 MRN: [id number]  Date of Service  02/08/2013   HPI/Events of Note  Request from nurse for a second dose of ativan 1 mg IV to help the patient rest.  Patient currenlty on BiPAP for HCRF with no followup ABG since initiation of BiPAP  eICU Interventions  Plan: Check ABG - if stable with give additional dose of ativan but consider dose reduction.   Intervention Category Minor Interventions: Agitation / anxiety - evaluation and management  DETERDING,[name] 02/08/2013, 4:01 AM

## 2013-02-08 NOTE — Progress Notes (Signed)
PULMONARY  / CRITICAL CARE MEDICINE  Name: [name] MRN: [id number] DOB: [birth year]    ADMISSION DATE:  02/05/2013 CONSULTATION DATE:  02/07/2013  REFERRING MD :  Elisabeth Pigeon   CHIEF COMPLAINT:  SOB/ Respiratory failure  BRIEF PATIENT DESCRIPTION:  62 y.o. male with hx of severe COPD and is on chronic steroids. He is an active smoker with h/o HTN, Hyperlipidemia and CAD. Presented 2/17 with increase shortness of breath and wheezing, despite increase nebulizer use.  He was admitted and started on Levaquin. Symptoms continued to exacerbate until early 2/18 when he was unable to catch his breath and was receiving respiratory treatments q2hours and he became acidotic. He was then transferred to ICU for critical care evaluation.    SIGNIFICANT EVENTS / STUDIES:   2/19: 2D Echo - Left ventricle: The cavity size was normal. Systolic function was normal. The estimated ejection fraction was in the range of 55% to 60%. Doppler parameters are consistent with abnormal left ventricular relaxation (grade 1 diastolic dysfunction).  LINES / TUBES:  CULTURES: BCx2 >>> Flu PCR (2/18) -  Neg Resp Virus Panel 2/19 - Neg  ANTIBIOTICS: Levaquin 2/18>>> Tamiflu 2/19-2/20  REVIEW OF SYSTEMS:  Positives in bold  Constitutional: No weight loss, gain, night sweats, Fevers, chills, fatigue .  HEENT: No headaches, visual changes, Difficulty swallowing, Tooth/dental problems, or Sore throat,  No sneezing, itching, ear ache, nasal congestion, post nasal drip, no visual complaints CV: No chest pain worse with inhalation, Orthopnea, PND, swelling in lower extremities, dizziness, palpitations, syncope.  GI No heartburn, indigestion, abdominal pain, nausea, vomiting, diarrhea, change in bowel habits, loss of appetite, bloody stools.  Resp: No cough with less mucous, No coughing up of blood. No change in color of mucus. No wheezing.  Skin: no rash or itching or icterus GU: no dysuria, change in color of urine, no  urgency or frequency. No flank pain, no hematuria  MS: No joint pain or swelling. No decreased range of motion  Psych: No change in mood or affect. No depression or anxiety.  Neuro: no difficulty with speech, weakness, numbness, ataxia   INTERVAL HISTORY: 2/20: Pt came off of BiPap several times during the night, but had to go back on shortly after for shortness of breath.   VITAL SIGNS: Temp:  [96.5 F (35.8 C)-98.6 F (37 C)] 97.6 F (36.4 C) (02/20 0400) Pulse Rate:  [70-101] 76 (02/20 0804) Resp:  [11-18] 15 (02/20 0700) BP: (116-161)/(76-102) 147/102 mmHg (02/20 0804) SpO2:  [30 %-100 %] 96 % (02/20 0804) FiO2 (%):  [30 %-100 %] 30 % (02/20 0503) Weight:  [87.1 kg (192 lb 0.3 oz)] 87.1 kg (192 lb 0.3 oz) (02/19 1111) HEMODYNAMICS:   VENTILATOR SETTINGS: Vent Mode:  [-]  FiO2 (%):  [30 %-100 %] 30 % INTAKE / OUTPUT: Intake/Output     02/19 0701 - 02/20 0700 02/20 0701 - 02/21 0700   P.O. 240    IV Piggyback 300    Total Intake(mL/kg) 540 (6.2)    Urine (mL/kg/hr) 2050 (1)    Total Output 2050     Net -1510            PHYSICAL EXAMINATION: General:  Acutely ill-appearing male in mild respiratory distress.  Neuro:  A&Ox4, answers questions appropriately. No Focal deficits.  HEENT:  PERRL, neck supple, no JVD.  Cardiovascular:  S1 S2 intact, no rub, gallop, murmur. Lungs:  Upper airway expiratory wheezes heard throughout.  Abdomen:  Soft, non-tender, non-distended. Musculoskeletal:  Intact  Skin:  Intact  LABS:  Recent Labs Lab 02/05/13 2247 02/06/13 0510  02/07/13 0333 02/07/13 0507 02/07/13 1025 02/07/13 1055 02/07/13 1546 02/07/13 2226 02/08/13 0334 02/08/13 0428  HGB 14.8 14.4  --  14.4  --   --   --   --   --  14.5  --   WBC 6.0 7.8  --  5.9  --   --   --   --   --  7.6  --   PLT 157 154  --  160  --   --   --   --   --  169  --   NA 138  --   --  141  --   --   --   --   --  136  --   K 3.9  --   --  4.4  --   --   --   --   --  4.0  --   CL 98   --   --  100  --   --   --   --   --  96  --   CO2 29  --   --  29  --   --   --   --   --  35*  --   GLUCOSE 74  --   --  211*  --   --   --   --   --  149*  --   BUN 12  --   --  15  --   --   --   --   --  20  --   CREATININE 0.81 0.87  --  0.75  --   --   --   --   --  0.79  --   CALCIUM 9.1  --   --  9.2  --   --   --   --   --  9.3  --   MG  --   --   --   --   --   --   --   --   --  2.4  --   PHOS  --   --   --   --   --   --   --   --   --  3.6  --   TROPONINI  --   --   --   --   --  <0.30  --  <0.30 <0.30  --   --   PROBNP  --   --   --   --   --  192.9*  --   --   --   --   --   PHART  --   --   < >  --  7.330*  --  7.293*  --   --   --  7.321*  PCO2ART  --   --   < >  --  59.0*  --  70.5*  --   --   --  70.4*  PO2ART  --   --   < >  --  93.0  --  163.0*  --   --   --  85.6  < > = values in this interval not displayed.  Recent Labs Lab 02/07/13 1729 02/07/13 2158 02/08/13 0512  GLUCAP 180* 175* 264*    CXR: 2/20 - Improved aeration with hyperinflation bilaterally.  ASSESSMENT / PLAN:  PULMONARY A:  1)Acute Respiratory Failure - likely cause is reactive airway/copd exacerbation in the setting of acute upper respiratory infection. Possible that pulmonary edema or flu may play a role.  2)Acute Exacerbation COPD P:   1) Continue cycling off BiPAP as tolerated.  2) Bronchodilators 3) IV steroids 4) f/u respiratory viral panel.   CARDIOVASCULAR A: History of CAD P:  1) Supportive Care   RENAL A:  No acute issue P:   Supportive Care  GASTROINTESTINAL A:  No Acute Issue P:   GI prophylaxis Supportive Care   INFECTIOUS A:  Purulent Bronchitis r/o CAP P:   1) Continue Levaquin 2) D/c tamiflu. 3) f/u CXR  ENDOCRINE A: Hyperglycemia likely steroid induced P:   SSI  NEUROLOGIC A:  No Acute Issue P:   Supportive Care  TODAY'S SUMMARY:    02/08/2013 8:13 AM Note by [name] and Joneen Roach nurse practitioner and nurse practitioner  student    STAFF NOTE: I, Dr Lavinia Sharps have personally reviewed patient's available data, including medical history, events of note, physical examination and test results as part of my evaluation. I have discussed with resident/NP and other care providers such as pharmacist, RN and RRT.  In addition,  I personally evaluated patient and elicited key findings of acute respiratory failure on account of COPD exacerbation. He is escaped intubation but this is still extremely tight and wheezing and we unable to get him off BiPAP which is continuous at this point. I increase the frequency of his Atrovent nebulizers and in addition we'll give magnesium sulfate. His high risk for decompensation and intubation continue. We will watch closely.  Rest per NP/medical resident whose note is outlined above and that I agree with  The patient is critically ill with multiple organ systems failure and requires high complexity decision making for assessment and support, frequent evaluation and titration of therapies, application of advanced monitoring technologies and extensive interpretation of multiple databases.   Critical Care Time devoted to patient care services described in this note is  31 minutes independent of nurse practitioner time  Dr. Kalman Shan, M.D., [facility].C.P Pulmonary and Critical Care Medicine Staff Physician [facility] [place] [facility] Pager: [phone], If no answer or between  15:00h - 7:00h: call [phone]  02/08/2013 11:03 AM

## 2013-02-09 ENCOUNTER — Inpatient Hospital Stay (HOSPITAL_COMMUNITY): Payer: Medicare Other

## 2013-02-09 LAB — BASIC METABOLIC PANEL
BUN: 26 mg/dL — ABNORMAL HIGH (ref 6–23)
CO2: 37 mEq/L — ABNORMAL HIGH (ref 19–32)
Chloride: 95 mEq/L — ABNORMAL LOW (ref 96–112)
Creatinine, Ser: 0.86 mg/dL (ref 0.50–1.35)
GFR calc Af Amer: >90 mL/min (ref >90)
Glucose, Bld: 107 mg/dL — ABNORMAL HIGH (ref 70–99)
Potassium: 4.4 mEq/L (ref 3.5–5.1)

## 2013-02-09 LAB — GLUCOSE, CAPILLARY
Glucose-Capillary: 123 mg/dL — ABNORMAL HIGH (ref 70–99)
Glucose-Capillary: 92 mg/dL (ref 70–99)

## 2013-02-09 LAB — CBC
HCT: 41 % (ref 39.0–52.0)
Hemoglobin: 13.3 g/dL (ref 13.0–17.0)
MCV: 89.9 fL (ref 78.0–100.0)
RDW: 13.7 % (ref 11.5–15.5)
WBC: 7.4 10*3/uL (ref 4.0–10.5)

## 2013-02-09 MED ORDER — ASPIRIN 325 MG PO TABS
325.0000 mg | ORAL_TABLET | Freq: Every morning | ORAL | Status: DC
  Administered 2013-02-10 – 2013-02-16 (×7): 325 mg via ORAL
  Filled 2013-02-09 (×7): qty 1

## 2013-02-09 MED ORDER — METHYLPREDNISOLONE SODIUM SUCC 125 MG IJ SOLR
80.0000 mg | Freq: Two times a day (BID) | INTRAMUSCULAR | Status: DC
  Administered 2013-02-09 – 2013-02-10 (×2): 80 mg via INTRAVENOUS
  Filled 2013-02-09 (×4): qty 1.28

## 2013-02-09 MED ORDER — ZOLPIDEM TARTRATE 5 MG PO TABS
5.0000 mg | ORAL_TABLET | Freq: Every evening | ORAL | Status: DC | PRN
  Filled 2013-02-09: qty 1

## 2013-02-09 NOTE — Progress Notes (Signed)
Patient was complaining about not getting enough pressure through his BIPAP. Changed Insp to 12 from 10 and kept exp the same at 6. I noticed before this that patient was trying to force himself breathing while on the BIPAP and I explained to him not to forced his breathing but to let the BIpap do that for him. He seemed somewhat annoyed but then he understood what I was talking about.

## 2013-02-09 NOTE — Progress Notes (Signed)
PULMONARY  / CRITICAL CARE MEDICINE  Name: [name] MRN: [id number] DOB: [birth year]    ADMISSION DATE:  02/05/2013 CONSULTATION DATE:  02/07/2013  REFERRING MD :  Elisabeth Pigeon   CHIEF COMPLAINT:  SOB/ Respiratory failure  BRIEF PATIENT DESCRIPTION:  62 y.o. male with hx of severe COPD and is on chronic steroids. He is an active smoker with h/o HTN, Hyperlipidemia and CAD. Presented 2/17 with increase shortness of breath and wheezing, despite increase nebulizer use.  He was admitted and started on Levaquin. Symptoms continued to exacerbate until early 2/18 when he was unable to catch his breath and was receiving respiratory treatments q2hours and he became acidotic. He was then transferred to ICU for critical care evaluation.    SIGNIFICANT EVENTS / STUDIES:   2/19: 2D Echo - Left ventricle: The cavity size was normal. Systolic function was normal. The estimated ejection fraction was in the range of 55% to 60%. Doppler parameters are consistent with abnormal left ventricular relaxation (grade 1 diastolic dysfunction).  LINES / TUBES:  CULTURES: BCx2 >>> Flu PCR (2/18) -  Neg Resp Virus Panel 2/19 - RSV B  ANTIBIOTICS: Levaquin 2/18>>> Tamiflu 2/19-2/20  INTERVAL HISTORY: 2/21: Intermittent BiPAP use overnight, has been off most of the morning with O2 sats around 95 on 5L [place]. When Fi02 is decreased slgihtly his sat quickly falls to the low 90's and he becomes symptomatic.   VITAL SIGNS: Temp:  [96.7 F (35.9 C)-98 F (36.7 C)] 97.2 F (36.2 C) (02/21 0800) Pulse Rate:  [56-87] 62 (02/21 0400) Resp:  [12-18] 14 (02/21 0400) BP: (111-158)/(69-96) 111/76 mmHg (02/21 0327) SpO2:  [93 %-98 %] 94 % (02/21 1032) HEMODYNAMICS:   VENTILATOR SETTINGS:   INTAKE / OUTPUT: Intake/Output     02/20 0701 - 02/21 0700 02/21 0701 - 02/22 0700   P.O. 640    I.V. (mL/kg) 110 (1.3)    IV Piggyback 100    Total Intake(mL/kg) 850 (9.8)    Urine (mL/kg/hr) 1950 (0.9)    Total Output 1950      Net -1100            PHYSICAL EXAMINATION: General:  Acutely ill-appearing male in mild respiratory distress.  Neuro:  A&Ox4, answers questions appropriately. Follows commands.  No Focal deficits.  HEENT:  PERRL, neck supple, no JVD.  Cardiovascular:  S1 S2 intact, no rub, gallop, murmur. Lungs:  Upper airway expiratory wheezes heard throughout.  Abdomen:  Soft, non-tender, non-distended. Musculoskeletal:  Intact  Skin:  Intact  LABS:  Recent Labs Lab 02/07/13 0333 02/07/13 0507 02/07/13 1025 02/07/13 1055 02/07/13 1546 02/07/13 2226 02/08/13 0334 02/08/13 0428 02/09/13 0335  HGB 14.4  --   --   --   --   --  14.5  --  13.3  WBC 5.9  --   --   --   --   --  7.6  --  7.4  PLT 160  --   --   --   --   --  169  --  156  NA 141  --   --   --   --   --  136  --  137  K 4.4  --   --   --   --   --  4.0  --  4.4  CL 100  --   --   --   --   --  96  --  95*  CO2 29  --   --   --   --   --  35*  --  37*  GLUCOSE 211*  --   --   --   --   --  149*  --  107*  BUN 15  --   --   --   --   --  20  --  26*  CREATININE 0.75  --   --   --   --   --  0.79  --  0.86  CALCIUM 9.2  --   --   --   --   --  9.3  --  8.8  MG  --   --   --   --   --   --  2.4  --  2.6*  PHOS  --   --   --   --   --   --  3.6  --  4.1  TROPONINI  --   --  <0.30  --  <0.30 <0.30  --   --   --   PROBNP  --   --  192.9*  --   --   --   --   --   --   PHART  --  7.330*  --  7.293*  --   --   --  7.321*  --   PCO2ART  --  59.0*  --  70.5*  --   --   --  70.4*  --   PO2ART  --  93.0  --  163.0*  --   --   --  85.6  --     Recent Labs Lab 02/08/13 0747 02/08/13 1142 02/08/13 1659 02/08/13 2143 02/09/13 0740  GLUCAP 114* 119* 135* 151* 123*    CXR: 2/21 mproved aeration bilaterally.  ASSESSMENT / PLAN:  PULMONARY A:  1)Acute Respiratory Failure - likely cause is reactive airway/copd exacerbation in the setting of acute upper respiratory infection. Possible that pulmonary edema may play a role.   2)Acute Exacerbation COPD in setting of RSV B.  P:   1) Continue cycling off BiPAP as tolerated.  2) Bronchodilators 3) IV steroids 4) F/u blood cultures. 5) Plan to transfer to floor 2/22 if BiPAP requirement decreases.  CARDIOVASCULAR A: History of CAD - Troponin x 3 negative 2/19 for c/o chest pain worse with deep breathing. P:  1) Supportive Care   RENAL A:  No acute issue P:    Supportive Care  GASTROINTESTINAL A:  No Acute Issue P:   GI prophylaxis Supportive Care   INFECTIOUS A:  Purulent Bronchitis r/o CAP P:   1) Continue Levaquin plan 7 day course ending 2/25. 3) f/u CXR  ENDOCRINE A: Hyperglycemia likely steroid induced P:   SSI  NEUROLOGIC A:  No Acute Issue P:   Supportive Care  TODAY'S SUMMARY:   Note by [name] and Joneen Roach nurse practitioner and nurse practitioner student        STAFF NOTE: I, Dr Lavinia Sharps have personally reviewed patient's available data, including medical history, events of note, physical examination and test results as part of my evaluation. I have discussed with resident/NP and other care providers such as pharmacist, RN and RRT.  In addition,  I personally evaluated patient and elicited key findings of acute respiratory failure secondary to COPD exacerbation. He is somewhat improved today is able to tolerate a few hours off BiPAP. He still is critically ill and will stay in the ICU on critical care service  Rest per NP/medical resident whose note is outlined above  and that I agree with  The patient is critically ill with multiple organ systems failure and requires high complexity decision making for assessment and support, frequent evaluation and titration of therapies, application of advanced monitoring technologies and extensive interpretation of multiple databases.   Critical Care Time devoted to patient care services described in this note is  31 minutes independent of nurse practitioner time     Dr.  Kalman Shan, M.D., [facility].C.P Pulmonary and Critical Care Medicine Staff Physician [facility] Pager: [phone], If no answer or between  15:00h - 7:00h: call [phone]  02/09/2013 11:21 AM

## 2013-02-10 ENCOUNTER — Inpatient Hospital Stay (HOSPITAL_COMMUNITY): Payer: Medicare Other

## 2013-02-10 DIAGNOSIS — R0902 Hypoxemia: Secondary | ICD-10-CM

## 2013-02-10 LAB — BASIC METABOLIC PANEL
CO2: 38 mEq/L — ABNORMAL HIGH (ref 19–32)
Calcium: 8.6 mg/dL (ref 8.4–10.5)
Glucose, Bld: 165 mg/dL — ABNORMAL HIGH (ref 70–99)
Potassium: 4.7 mEq/L (ref 3.5–5.1)
Sodium: 136 mEq/L (ref 135–145)

## 2013-02-10 LAB — PHOSPHORUS: Phosphorus: 3.4 mg/dL (ref 2.3–4.6)

## 2013-02-10 LAB — GLUCOSE, CAPILLARY
Glucose-Capillary: 192 mg/dL — ABNORMAL HIGH (ref 70–99)
Glucose-Capillary: 135 mg/dL — ABNORMAL HIGH (ref 70–99)

## 2013-02-10 LAB — MAGNESIUM: Magnesium: 2.5 mg/dL (ref 1.5–2.5)

## 2013-02-10 MED ORDER — LORAZEPAM 2 MG/ML IJ SOLN
1.0000 mg | Freq: Every evening | INTRAMUSCULAR | Status: DC | PRN
  Administered 2013-02-10 – 2013-02-11 (×5): 1 mg via INTRAVENOUS
  Filled 2013-02-10 (×5): qty 1

## 2013-02-10 MED ORDER — PREDNISONE 50 MG PO TABS
60.0000 mg | ORAL_TABLET | Freq: Every day | ORAL | Status: DC
  Administered 2013-02-11 – 2013-02-13 (×3): 60 mg via ORAL
  Filled 2013-02-10 (×4): qty 1

## 2013-02-10 NOTE — Progress Notes (Signed)
Patient got up and went to use BSC. Patient became in resp distress while sitting up. Patient was on his breathing treatment while using the bathroom ..per orders pt request.Patient then asked to be put on his bipap while sitting up. I put him on his bipap and he stated that he was not getting any relief and then he asked for another treatment. The orders said that he could get PRN tx every 2 hours as needed. I called the other RT to come and assess the pt. We called e link and the physician looked in on him. We made a few adjustments with the IPAP from 12 to 15 and the EPAP from 6 to 5. The pt stated that it was too much. Reduced I pap back down to 12...still no relief. I went and got a vent and used the Bipap mode and the patient is tolerating it well. Will continue to monitor!

## 2013-02-10 NOTE — Progress Notes (Signed)
PULMONARY  / CRITICAL CARE MEDICINE  Name: [name] MRN: [id number] DOB: [birth year]    ADMISSION DATE:  02/05/2013 CONSULTATION DATE:  02/07/2013  REFERRING MD :  Elisabeth Pigeon   CHIEF COMPLAINT:  SOB/ Respiratory failure  BRIEF PATIENT DESCRIPTION:  62 y.o. male with hx of severe COPD and is on chronic steroids. He is an active smoker with h/o HTN, Hyperlipidemia and CAD. Presented 2/17 with increase shortness of breath and wheezing, despite increase nebulizer use.  He was admitted and started on Levaquin. Symptoms continued to exacerbate until early 2/18 when he was unable to catch his breath and was receiving respiratory treatments q2hours and he became acidotic. He was then transferred to ICU for critical care evaluation.   SIGNIFICANT EVENTS / STUDIES:   2/19: 2D Echo - Left ventricle: The cavity size was normal. Systolic function was normal. The estimated ejection fraction was in the range of 55% to 60%. Doppler parameters are consistent with abnormal left ventricular relaxation (grade 1 diastolic dysfunction).  LINES / TUBES:  CULTURES: BCx2 >>> Flu PCR (2/18) -  Neg Resp Virus Panel 2/19 - RSV B  ANTIBIOTICS: Levaquin 2/18>>> 2/22 Tamiflu 2/19-2/20  INTERVAL HISTORY: Challenging to tolerate biPAP overnight but did wear it for several hours Ativan given x 1 seemed to help Continues to have severe SOB with any work > getting to bedside commode  VITAL SIGNS: Temp:  [97.5 F (36.4 C)-99.5 F (37.5 C)] 97.6 F (36.4 C) (02/22 0800) Pulse Rate:  [67-96] 89 (02/22 0800) Resp:  [11-19] 17 (02/22 0636) BP: (129-164)/(70-96) 155/96 mmHg (02/22 0800) SpO2:  [94 %-100 %] 99 % (02/22 0748) FiO2 (%):  [40 %] 40 % (02/22 0551) HEMODYNAMICS:   VENTILATOR SETTINGS: Vent Mode:  [-]  FiO2 (%):  [40 %] 40 % INTAKE / OUTPUT: Intake/Output     02/21 0701 - 02/22 0700 02/22 0701 - 02/23 0700   P.O. 660    I.V. (mL/kg) 80 (0.9)    IV Piggyback 100    Total Intake(mL/kg) 840 (9.6)     Urine (mL/kg/hr) 1575 (0.8) 750 (2.6)   Total Output 1575 750   Net -735 -750          PHYSICAL EXAMINATION: General:  ill-appearing male Neuro:  A&Ox4, answers questions appropriately. Follows commands.  No Focal deficits.  HEENT:  PERRL, neck supple, no JVD.  Cardiovascular:  S1 S2 intact, no rub, gallop, murmur. Lungs:  Upper and lower airway expiratory wheezes heard throughout.  Abdomen:  Soft, non-tender, non-distended. Musculoskeletal:  Intact  Skin:  Intact  LABS:  Recent Labs Lab 02/07/13 0333 02/07/13 0507 02/07/13 1025 02/07/13 1055 02/07/13 1546 02/07/13 2226 02/08/13 0334 02/08/13 0428 02/09/13 0335 02/10/13 0342  HGB 14.4  --   --   --   --   --  14.5  --  13.3  --   WBC 5.9  --   --   --   --   --  7.6  --  7.4  --   PLT 160  --   --   --   --   --  169  --  156  --   NA 141  --   --   --   --   --  136  --  137 136  K 4.4  --   --   --   --   --  4.0  --  4.4 4.7  CL 100  --   --   --   --   --  96  --  95* 94*  CO2 29  --   --   --   --   --  35*  --  37* 38*  GLUCOSE 211*  --   --   --   --   --  149*  --  107* 165*  BUN 15  --   --   --   --   --  20  --  26* 25*  CREATININE 0.75  --   --   --   --   --  0.79  --  0.86 0.92  CALCIUM 9.2  --   --   --   --   --  9.3  --  8.8 8.6  MG  --   --   --   --   --   --  2.4  --  2.6* 2.5  PHOS  --   --   --   --   --   --  3.6  --  4.1 3.4  TROPONINI  --   --  <0.30  --  <0.30 <0.30  --   --   --   --   PROBNP  --   --  192.9*  --   --   --   --   --   --   --   PHART  --  7.330*  --  7.293*  --   --   --  7.321*  --   --   PCO2ART  --  59.0*  --  70.5*  --   --   --  70.4*  --   --   PO2ART  --  93.0  --  163.0*  --   --   --  85.6  --   --     Recent Labs Lab 02/09/13 0740 02/09/13 1118 02/09/13 1704 02/09/13 2200 02/10/13 0734  GLUCAP 123* 133* 92 147* 97    CXR: 2/22 no infiltrates  ASSESSMENT / PLAN:  PULMONARY A:  Acute Respiratory Failure - likely cause is reactive airway/copd  exacerbation in the setting of acute upper respiratory infection. Possible that pulmonary edema may play a role.  Acute Exacerbation COPD in setting of RSV B.  P:   BiPAP to qhs + prn Bronchodilators as ordered, decrease to q6h on 2/23 Change IV steroids to pred 60 on 2/22 d/c levaquin  CARDIOVASCULAR A: History of CAD - Troponin x 3 negative 2/19 for c/o chest pain worse with deep breathing. P:  1) Supportive Care   RENAL A:  No acute issue P:    Supportive Care  GASTROINTESTINAL A:  No Acute Issue P:   GI prophylaxis Supportive Care   INFECTIOUS A:  Purulent Bronchitis r/o CAP P:   d/c levaquin 2/22  ENDOCRINE A: Hyperglycemia likely steroid induced P:   SSI  NEUROLOGIC A:  No Acute Issue P:   Supportive Care  Levy Pupa, MD, PhD 02/10/2013, 10:26 AM [place] [phone] or if no answer [phone]

## 2013-02-11 LAB — BLOOD GAS, ARTERIAL
Bicarbonate: 40 mEq/L — ABNORMAL HIGH (ref 20.0–24.0)
Delivery systems: POSITIVE
FIO2: 0.4 %
O2 Saturation: 99 %
Patient temperature: 98.6
TCO2: 36.1 mmol/L (ref 0–100)

## 2013-02-11 LAB — BASIC METABOLIC PANEL
CO2: 41 mEq/L (ref 19–32)
Calcium: 9 mg/dL (ref 8.4–10.5)
Chloride: 94 mEq/L — ABNORMAL LOW (ref 96–112)
Creatinine, Ser: 0.84 mg/dL (ref 0.50–1.35)
GFR calc Af Amer: >90 mL/min (ref >90)
Sodium: 140 mEq/L (ref 135–145)

## 2013-02-11 LAB — GLUCOSE, CAPILLARY
Glucose-Capillary: 123 mg/dL — ABNORMAL HIGH (ref 70–99)
Glucose-Capillary: 156 mg/dL — ABNORMAL HIGH (ref 70–99)

## 2013-02-11 MED ORDER — BISACODYL 10 MG RE SUPP: 10.0000 mg | Freq: Every day | RECTAL | Status: DC | PRN

## 2013-02-11 NOTE — Progress Notes (Signed)
Pt on and off bipap throughout the night. A total of 2mg  ativan given by 0030 to enable pt to wear mask. Pt pulled bipap off x2 with no nasal cannula on.Nasal cannula replaced. Pt educated on the importance of keeping oxygen on at all times and to use call light if needed. Patient also appears to be slightly confused. Albon MD notified. Abg ordered.

## 2013-02-11 NOTE — Progress Notes (Signed)
Labs reviewed; I/O -6L  At this time the patient has a chronic respiratory acidosis compensated with metabolic alkalosis and now with diuretic induced contraction alkalosis.   Plan dc lasix;   Agree with a trial of [place].

## 2013-02-11 NOTE — Plan of Care (Signed)
Problem: Phase I Progression Outcomes Goal: Dyspnea controlled at rest Outcome: Progressing Slight improvement Goal: Pt OOB to Walk or Exercise Daily With Nursing or PT Patient OOB to walk or exercise daily with nursing or PT if activity order permits  Outcome: Progressing Bedside chair.  Patient SHOB with minimal activity. Goal: Tolerating diet Outcome: Progressing Must eat slowly due to [facility].

## 2013-02-11 NOTE — Progress Notes (Signed)
0340-Pt kept pulling BIPAP mask off, Pt placed on 5 LPM [place] and tolerating well at this time, RT to monitor and assess as needed.

## 2013-02-11 NOTE — Progress Notes (Signed)
PULMONARY  / CRITICAL CARE MEDICINE  Name: [name] MRN: [id number] DOB: 21-Jan-1951    ADMISSION DATE:  02/05/2013 CONSULTATION DATE:  02/07/2013  REFERRING MD :  Elisabeth Pigeon   CHIEF COMPLAINT:  SOB/ Respiratory failure  BRIEF PATIENT DESCRIPTION:  62 y.o. male with hx of severe COPD and is on chronic steroids. He is an active smoker with h/o HTN, Hyperlipidemia and CAD. Presented 2/17 with increase shortness of breath and wheezing, despite increase nebulizer use.  He was admitted and started on Levaquin. Symptoms continued to exacerbate until early 2/18 when he was unable to catch his breath and was receiving respiratory treatments q2hours and he became acidotic. He was then transferred to ICU for critical care evaluation.   SIGNIFICANT EVENTS / STUDIES:   2/19: 2D Echo - Left ventricle: The cavity size was normal. Systolic function was normal. The estimated ejection fraction was in the range of 55% to 60%. Doppler parameters are consistent with abnormal left ventricular relaxation (grade 1 diastolic dysfunction).  LINES / TUBES:  CULTURES: BCx2 >>> Flu PCR (2/18) -  Neg Resp Virus Panel 2/19 - RSV B  ANTIBIOTICS: Levaquin 2/18>>> 2/22 Tamiflu 2/19-2/20  INTERVAL HISTORY: Lasix stopped 2/23 am eLink due to net negative and contraction alkalosis  VITAL SIGNS: Temp:  [97.1 F (36.2 C)-98 F (36.7 C)] 97.1 F (36.2 C) (02/23 0800) Pulse Rate:  [45-95] 95 (02/23 0736) Resp:  [13-19] 16 (02/23 0736) BP: (101-142)/(57-86) 128/70 mmHg (02/23 0736) SpO2:  [95 %-100 %] 98 % (02/23 0736) HEMODYNAMICS:   VENTILATOR SETTINGS:   INTAKE / OUTPUT: Intake/Output     02/22 0701 - 02/23 0700 02/23 0701 - 02/24 0700   P.O. 960    I.V. (mL/kg) 20 (0.2)    IV Piggyback     Total Intake(mL/kg) 980 (11.3)    Urine (mL/kg/hr) 3400 (1.6)    Total Output 3400     Net -2420            PHYSICAL EXAMINATION: General:  ill-appearing male Neuro:  A&Ox4, answers questions appropriately.  Follows commands.  No Focal deficits.  HEENT:  PERRL, neck supple, no JVD.  Cardiovascular:  S1 S2 intact, no rub, gallop, murmur. Lungs:  Upper and lower airway expiratory wheezes heard throughout.  Abdomen:  Soft, non-tender, non-distended. Musculoskeletal:  Intact  Skin:  Intact  LABS:  Recent Labs Lab 02/07/13 0333  02/07/13 1025 02/07/13 1055 02/07/13 1546 02/07/13 2226 02/08/13 0334 02/08/13 0428 02/09/13 0335 02/10/13 0342 02/11/13 0152 02/11/13 0350  HGB 14.4  --   --   --   --   --  14.5  --  13.3  --   --   --   WBC 5.9  --   --   --   --   --  7.6  --  7.4  --   --   --   PLT 160  --   --   --   --   --  169  --  156  --   --   --   NA 141  --   --   --   --   --  136  --  137 136  --  140  K 4.4  --   --   --   --   --  4.0  --  4.4 4.7  --  3.8  CL 100  --   --   --   --   --  96  --  95* 94*  --  94*  CO2 29  --   --   --   --   --  35*  --  37* 38*  --  41*  GLUCOSE 211*  --   --   --   --   --  149*  --  107* 165*  --  86  BUN 15  --   --   --   --   --  20  --  26* 25*  --  19  CREATININE 0.75  --   --   --   --   --  0.79  --  0.86 0.92  --  0.84  CALCIUM 9.2  --   --   --   --   --  9.3  --  8.8 8.6  --  9.0  MG  --   --   --   --   --   --  2.4  --  2.6* 2.5  --   --   PHOS  --   --   --   --   --   --  3.6  --  4.1 3.4  --   --   TROPONINI  --   --  <0.30  --  <0.30 <0.30  --   --   --   --   --   --   PROBNP  --   --  192.9*  --   --   --   --   --   --   --   --   --   PHART  --   < >  --  7.293*  --   --   --  7.321*  --   --  7.353  --   PCO2ART  --   < >  --  70.5*  --   --   --  70.4*  --   --  73.8*  --   PO2ART  --   < >  --  163.0*  --   --   --  85.6  --   --  130.0*  --   < > = values in this interval not displayed.  Recent Labs Lab 02/09/13 1704 02/09/13 2200 02/10/13 0734 02/10/13 1124 02/10/13 1629  GLUCAP 92 147* 97 192* 135*    CXR: 2/22 no infiltrates  ASSESSMENT / PLAN:  PULMONARY A:  Acute Respiratory Failure - likely  cause is reactive airway/copd exacerbation in the setting of acute upper respiratory infection. Possible that pulmonary edema may play a role.  Acute Exacerbation COPD in setting of RSV B.  P:   BiPAP to qhs + prn Bronchodilators as ordered, ? decrease to q6h on 2/24 (still asking for them more frequently) Changed IV steroids to pred 60 on 2/22 d/c'd levaquin 2/22  CARDIOVASCULAR A: History of CAD - Troponin x 3 negative 2/19 for c/o chest pain worse with deep breathing. P:  Supportive Care lasix stopped 2/23 am, follow I/O   RENAL A:  No acute issue P:    Supportive Care  GASTROINTESTINAL A:  No Acute Issue P:   GI prophylaxis Supportive Care   INFECTIOUS A:  Purulent Bronchitis r/o CAP P:   d/c'd levaquin 2/22  ENDOCRINE A: Hyperglycemia likely steroid induced P:   SSI  NEUROLOGIC A:  No Acute Issue P:   Supportive Care  Levy Pupa, MD, PhD 02/11/2013, 9:45 AM [place]  [facility] [phone] or if no answer [phone]

## 2013-02-11 NOTE — Progress Notes (Signed)
CRITICAL VALUE ALERT  Critical value received:  Co2 41  Date of notification:  02/11/13  Time of notification:  0445  Critical value read back:yes  Nurse who received alert:  Mort Sawyers Rn  MD notified (1st page):  Albon MD  Time of first page:  0506  MD notified (2nd page): Time MD responded:  [phone]

## 2013-02-12 ENCOUNTER — Ambulatory Visit: Payer: Self-pay | Admitting: Emergency Medicine

## 2013-02-12 LAB — GLUCOSE, CAPILLARY
Glucose-Capillary: 102 mg/dL — ABNORMAL HIGH (ref 70–99)
Glucose-Capillary: 139 mg/dL — ABNORMAL HIGH (ref 70–99)
Glucose-Capillary: 151 mg/dL — ABNORMAL HIGH (ref 70–99)
Glucose-Capillary: 101 mg/dL — ABNORMAL HIGH (ref 70–99)

## 2013-02-12 MED ORDER — LEVALBUTEROL HCL 0.63 MG/3ML IN NEBU
0.6300 mg | INHALATION_SOLUTION | Freq: Four times a day (QID) | RESPIRATORY_TRACT | Status: DC
  Administered 2013-02-12 – 2013-02-13 (×5): 0.63 mg via RESPIRATORY_TRACT
  Filled 2013-02-12 (×8): qty 3

## 2013-02-12 MED ORDER — IPRATROPIUM BROMIDE 0.02 % IN SOLN
0.5000 mg | Freq: Four times a day (QID) | RESPIRATORY_TRACT | Status: DC
  Administered 2013-02-12 – 2013-02-15 (×11): 0.5 mg via RESPIRATORY_TRACT
  Filled 2013-02-12 (×12): qty 2.5

## 2013-02-12 NOTE — Progress Notes (Signed)
PT does not appear to be in respiratory distress at this time (PT states that he agrees he is breathing better and was able to utilize his BiPAP up until 0400- 0500 this morning). PT does not need BiPAP at this time- HR 90, RR 15, Sp02 on 3 lpm [place] 97%, BS are documented under Doc Flowsheet. RN aware that PT is not on BiPAP at this time.

## 2013-02-12 NOTE — Progress Notes (Signed)
PULMONARY  / CRITICAL CARE MEDICINE  Name: [name] MRN: [id number] DOB: 02-Jun-1951    ADMISSION DATE:  02/05/2013 CONSULTATION DATE:  02/07/2013  REFERRING MD :  Elisabeth Pigeon   CHIEF COMPLAINT:  SOB/ Respiratory failure  BRIEF PATIENT DESCRIPTION:  62 y.o. male with hx of severe COPD and is on chronic steroids. He is an active smoker with h/o HTN, Hyperlipidemia and CAD. Presented 2/17 with increase shortness of breath and wheezing, despite increase nebulizer use.  He was admitted and started on Levaquin. Symptoms continued to exacerbate until early 2/18 when he was unable to catch his breath and was receiving respiratory treatments q2hours and he became acidotic. He was then transferred to ICU for critical care evaluation.   SIGNIFICANT EVENTS / STUDIES:   2/19: 2D Echo - Left ventricle: The cavity size was normal. Systolic function was normal. The estimated ejection fraction was in the range of 55% to 60%. Doppler parameters are consistent with abnormal left ventricular relaxation (grade 1 diastolic dysfunction).  LINES / TUBES:  CULTURES: BCx2 >>> Flu PCR (2/18) -  Neg Resp Virus Panel 2/19 - RSV B  ANTIBIOTICS: Levaquin 2/18>>> 2/22 Tamiflu 2/19-2/20  INTERVAL HISTORY: Lasix stopped 2/23 am eLink due to net negative and contraction alkalosis  VITAL SIGNS: Temp:  [96.6 F (35.9 C)-98 F (36.7 C)] 97.8 F (36.6 C) (02/24 0800) Pulse Rate:  [65-101] 81 (02/24 0500) Resp:  [14-21] 14 (02/24 0500) BP: (108-155)/(61-85) 128/73 mmHg (02/24 0601) SpO2:  [94 %-100 %] 97 % (02/24 0802) FiO2 (%):  [30 %] 30 % (02/24 0432) HEMODYNAMICS:   VENTILATOR SETTINGS: Vent Mode:  [-]  FiO2 (%):  [30 %] 30 % INTAKE / OUTPUT: Intake/Output     02/23 0701 - 02/24 0700 02/24 0701 - 02/25 0700   P.O. 930 360   I.V. (mL/kg)     Total Intake(mL/kg) 930 (10.7) 360 (4.1)   Urine (mL/kg/hr) 3200 (1.5)    Total Output 3200     Net -2270 +360          PHYSICAL EXAMINATION: General:   ill-appearing male Neuro:  A&Ox4, answers questions appropriately. Follows commands.  No Focal deficits.  HEENT:  PERRL, neck supple, no JVD. + UAW wheeze  Cardiovascular:  S1 S2 intact, no rub, gallop, murmur. Lungs:  Upper and lower airway expiratory wheezes heard throughout.  Abdomen:  Soft, non-tender, non-distended. Musculoskeletal:  Intact  Skin:  Intact  LABS:  Recent Labs Lab 02/09/13 0335 02/10/13 0342 02/11/13 0350  NA 137 136 140  K 4.4 4.7 3.8  CL 95* 94* 94*  CO2 37* 38* 41*  BUN 26* 25* 19  CREATININE 0.86 0.92 0.84  GLUCOSE 107* 165* 86    Recent Labs Lab 02/07/13 0333 02/08/13 0334 02/09/13 0335  HGB 14.4 14.5 13.3  HCT 43.1 44.9 41.0  WBC 5.9 7.6 7.4  PLT 160 169 156      Recent Labs Lab 02/10/13 1629 02/11/13 0732 02/11/13 1207 02/11/13 1604 02/11/13 2208  GLUCAP 135* 123* 171* 156* 158*    CXR: 2/22 no infiltrates  ASSESSMENT / PLAN:  Acute Respiratory Failure - likely cause is reactive airway/copd exacerbation in the setting of acute upper respiratory infection. Possible that pulmonary edema may play a role.  Acute Exacerbation COPD in setting of RSV B.  P:   Bronchodilators as ordered, will change freq  Changed IV steroids to pred 60 on 2/22, will taper  d/c'd levaquin 2/22   History of CAD - Troponin x  3 negative 2/19 for c/o chest pain worse with deep breathing. P:  Supportive Care lasix stopped 2/23 am, follow I/O   Purulent Bronchitis r/o CAP P:   d/c'd levaquin 2/22   Hyperglycemia likely steroid induced P:   SSI   Levy Pupa, MD, PhD 02/12/2013, 10:50 AM [place] [facility] [phone] or if no answer [phone]

## 2013-02-13 LAB — CULTURE, BLOOD (ROUTINE X 2): Culture: NO GROWTH

## 2013-02-13 LAB — GLUCOSE, CAPILLARY
Glucose-Capillary: 79 mg/dL (ref 70–99)
Glucose-Capillary: 122 mg/dL — ABNORMAL HIGH (ref 70–99)

## 2013-02-13 LAB — CREATININE, SERUM: GFR calc Af Amer: >90 mL/min (ref >90)

## 2013-02-13 MED ORDER — BUDESONIDE 0.5 MG/2ML IN SUSP
0.5000 mg | Freq: Two times a day (BID) | RESPIRATORY_TRACT | Status: DC
  Administered 2013-02-13 – 2013-02-16 (×6): 0.5 mg via RESPIRATORY_TRACT
  Filled 2013-02-13 (×9): qty 2

## 2013-02-13 MED ORDER — ARFORMOTEROL TARTRATE 15 MCG/2ML IN NEBU
15.0000 ug | INHALATION_SOLUTION | Freq: Two times a day (BID) | RESPIRATORY_TRACT | Status: DC
  Administered 2013-02-13 – 2013-02-15 (×4): 15 ug via RESPIRATORY_TRACT
  Filled 2013-02-13 (×7): qty 2

## 2013-02-13 MED ORDER — LORAZEPAM 1 MG PO TABS
1.0000 mg | ORAL_TABLET | Freq: Every evening | ORAL | Status: DC | PRN
  Administered 2013-02-14 – 2013-02-15 (×3): 1 mg via ORAL
  Filled 2013-02-13 (×4): qty 1

## 2013-02-13 MED ORDER — PREDNISONE 20 MG PO TABS
40.0000 mg | ORAL_TABLET | Freq: Every day | ORAL | Status: DC
  Administered 2013-02-14 – 2013-02-16 (×3): 40 mg via ORAL
  Filled 2013-02-13 (×5): qty 2

## 2013-02-13 NOTE — Progress Notes (Signed)
eLink Physician-Brief Progress Note Patient Name: DAMIAN BUCKLES DOB: 03/11/1951 MRN: [id number]  Date of Service  02/13/2013   HPI/Events of Note     eICU Interventions  Order for ativan changed to po since patient no longer has IV access   Intervention Category Minor Interventions: Routine modifications to care plan (e.g. PRN medications for pain, fever)  Dovid Bartko 02/13/2013, 11:17 PM

## 2013-02-13 NOTE — Progress Notes (Addendum)
PULMONARY  / CRITICAL CARE MEDICINE  Name: [name] MRN: [id number] DOB: 11-11-1951    ADMISSION DATE:  02/05/2013 CONSULTATION DATE:  02/07/2013  REFERRING MD :  Elisabeth Pigeon   CHIEF COMPLAINT:  SOB/ Respiratory failure  BRIEF PATIENT DESCRIPTION:  62 y.o. male with hx of severe COPD and is on chronic steroids. He is an active smoker with h/o HTN, Hyperlipidemia and CAD. Presented 2/17 with increase shortness of breath and wheezing, despite increase nebulizer use.  He was admitted and started on Levaquin. Symptoms continued to exacerbate until early 2/18 when he was unable to catch his breath and was receiving respiratory treatments q2hours and he became acidotic. He was then transferred to ICU for critical care evaluation.   SIGNIFICANT EVENTS / STUDIES:   2/19: 2D Echo - Left ventricle: The cavity size was normal. Systolic function was normal. The estimated ejection fraction was in the range of 55% to 60%. Doppler parameters are consistent with abnormal left ventricular relaxation (grade 1 diastolic dysfunction).     CULTURES: BCx2 >>> Flu PCR (2/18) -  Neg Resp Virus Panel 2/19 - RSV B  ANTIBIOTICS: Levaquin 2/18>>> 2/22 Tamiflu 2/19-2/20  INTERVAL HISTORY: Still asking for more freq nebs.   VITAL SIGNS: Temp:  [98 F (36.7 C)-98.5 F (36.9 C)] 98.5 F (36.9 C) (02/25 1322) Pulse Rate:  [79-93] 80 (02/25 1322) Resp:  [18-20] 20 (02/25 1322) BP: (116-166)/(69-80) 116/69 mmHg (02/25 1322) SpO2:  [93 %-98 %] 96 % (02/25 1322) 3 liters    INTAKE / OUTPUT: Intake/Output     02/24 0701 - 02/25 0700 02/25 0701 - 02/26 0700   P.O. 840 480   Total Intake(mL/kg) 840 (9.6) 480 (5.5)   Urine (mL/kg/hr) 3075 (1.5) 400 (0.7)   Total Output 3075 400   Net -2235 +80        Stool Occurrence 1 x      PHYSICAL EXAMINATION: General:  ill-appearing male Neuro:  A&Ox4, answers questions appropriately. Follows commands.  No Focal deficits.  HEENT:  PERRL, neck supple, no JVD. +  UAW wheeze  Cardiovascular:  S1 S2 intact, no rub, gallop, murmur. Lungs:  Upper Predominant  and lower airway expiratory wheezes heard throughout with congested sounding cough Abdomen:  Soft, non-tender, non-distended. Musculoskeletal:  Intact  Skin:  Intact  LABS:  Recent Labs Lab 02/09/13 0335 02/10/13 0342 02/11/13 0350 02/13/13 0505  NA 137 136 140  --   K 4.4 4.7 3.8  --   CL 95* 94* 94*  --   CO2 37* 38* 41*  --   BUN 26* 25* 19  --   CREATININE 0.86 0.92 0.84 0.77  GLUCOSE 107* 165* 86  --     Recent Labs Lab 02/07/13 0333 02/08/13 0334 02/09/13 0335  HGB 14.4 14.5 13.3  HCT 43.1 44.9 41.0  WBC 5.9 7.6 7.4  PLT 160 169 156      Recent Labs Lab 02/12/13 1123 02/12/13 1711 02/12/13 2139 02/13/13 0722 02/13/13 1119  GLUCAP 139* 151* 101* 79 122*    CXR: 2/22 no infiltrates  ASSESSMENT / PLAN:  Acute Respiratory Failure with hypercarbia- likely cause is ab/copd exacerbation in the setting of acute upper respiratory infection. Possible that pulmonary edema may play a role from diastolic chf  Acute Exacerbation COPD in setting of RSV B.  P:   Cont BD-->see if we can add budesonide and brovana for better control. Cost may be an issue at discharge.  Taper steroids to 10mg  daily Wean  FIO2  History of CAD - Troponin x 3 negative 2/19 for c/o chest pain worse with deep breathing. P:  Supportive Care lasix stopped 2/23 am, follow I/O   Hyperglycemia likely steroid induced P:   SSI   DDX of  difficult airways managment all start with A and  include Adherence, Ace Inhibitors, Acid Reflux, Active Sinus Disease, Alpha 1 Antitripsin deficiency, Anxiety masquerading as Airways dz,  ABPA,  allergy(esp in young), Aspiration (esp in elderly), Adverse effects of DPI,  Active smokers, plus two Bs  = Bronchiectasis and Beta blocker use..and one C= CHF   Active smoking is the greatest concern having covered all the other possibilities to this point. ? Anxiety >  dx of exclusion esp since he is chronically hypercarbic ? Approaching endstage at this point?  Sandrea Hughs, MD Pulmonary and Critical Care Medicine [facility] Cell [phone] After 5:30 PM or weekends, call [phone]    Sandrea Hughs, MD Pulmonary and Critical Care Medicine [facility] Cell [phone] After 5:30 PM or weekends, call [phone]

## 2013-02-14 LAB — GLUCOSE, CAPILLARY
Glucose-Capillary: 167 mg/dL — ABNORMAL HIGH (ref 70–99)
Glucose-Capillary: 155 mg/dL — ABNORMAL HIGH (ref 70–99)

## 2013-02-14 NOTE — Progress Notes (Signed)
PULMONARY  / CRITICAL CARE MEDICINE  Name: [name] MRN: [id number] DOB: 1951/01/20    ADMISSION DATE:  02/05/2013 CONSULTATION DATE:  02/07/2013  REFERRING MD :  Elisabeth Pigeon   CHIEF COMPLAINT:  SOB/ Respiratory failure  BRIEF PATIENT DESCRIPTION:  62 y.o. male with hx of severe COPD and is on chronic steroids. He is an active smoker with h/o HTN, Hyperlipidemia and CAD. Presented 2/17 with increase shortness of breath and wheezing, despite increase nebulizer use.  He was admitted and started on Levaquin. Symptoms continued to exacerbate until early 2/18 when he was unable to catch his breath and was receiving respiratory treatments q2hours and he became acidotic. He was then transferred to ICU for critical care evaluation.   SIGNIFICANT EVENTS / STUDIES:   2/19: 2D Echo - Left ventricle: The cavity size was normal. Systolic function was normal. The estimated ejection fraction was in the range of 55% to 60%. Doppler parameters are consistent with abnormal left ventricular relaxation (grade 1 diastolic dysfunction).  CULTURES: BCx2 >>> Flu PCR (2/18) -  Neg Resp Virus Panel 2/19 - RSV B  ANTIBIOTICS: Levaquin 2/18>>> 2/22 Tamiflu 2/19-2/20  INTERVAL HISTORY: Wheezing and overall status improved Slept better last night 2/25  VITAL SIGNS: Temp:  [97.7 F (36.5 C)-98.5 F (36.9 C)] 97.8 F (36.6 C) (02/26 0543) Pulse Rate:  [76-94] 76 (02/26 0543) Resp:  [20] 20 (02/26 0543) BP: (116-132)/(69-78) 132/78 mmHg (02/26 0543) SpO2:  [95 %-98 %] 95 % (02/26 0902) 3 liters    INTAKE / OUTPUT: Intake/Output     02/25 0701 - 02/26 0700 02/26 0701 - 02/27 0700   P.O. 720 240   Total Intake(mL/kg) 720 (8.3) 240 (2.8)   Urine (mL/kg/hr) 1800 (0.9)    Total Output 1800     Net -1080 +240          PHYSICAL EXAMINATION: General:  ill-appearing male Neuro:  A&Ox4, answers questions appropriately. Follows commands.  No Focal deficits.  HEENT:  PERRL, neck supple, no JVD. + UAW wheeze   Cardiovascular:  S1 S2 intact, no rub, gallop, murmur. Lungs:  More distant but wheezing much improved Abdomen:  Soft, non-tender, non-distended. Musculoskeletal:  Intact  Skin:  Intact  LABS:  Recent Labs Lab 02/09/13 0335 02/10/13 0342 02/11/13 0350 02/13/13 0505  NA 137 136 140  --   K 4.4 4.7 3.8  --   CL 95* 94* 94*  --   CO2 37* 38* 41*  --   BUN 26* 25* 19  --   CREATININE 0.86 0.92 0.84 0.77  GLUCOSE 107* 165* 86  --     Recent Labs Lab 02/08/13 0334 02/09/13 0335  HGB 14.5 13.3  HCT 44.9 41.0  WBC 7.6 7.4  PLT 169 156      Recent Labs Lab 02/13/13 0722 02/13/13 1119 02/13/13 1608 02/13/13 2136 02/14/13 0745  GLUCAP 79 122* 168* 150* 90    CXR: 2/22 no infiltrates  ASSESSMENT / PLAN:  Acute Respiratory Failure with hypercarbia- likely cause is ab/copd exacerbation in the setting of acute upper respiratory infection. Possible that pulmonary edema may play a role from diastolic chf  Acute Exacerbation COPD in setting of RSV B.  P:   Cont BD-- added budesonide and brovana for better control. Cost may be an issue at discharge, will investigate costs on medicare Tapered steroids to 40mg  daily 2/25, continue to wean slowly Wean FIO2 Tobacco cessation critical at this point - not sure he will be willing to  do so  History of CAD - Troponin x 3 negative 2/19 for c/o chest pain worse with deep breathing. P:  Supportive Care lasix stopped 2/23 am, follow I/O   Hyperglycemia likely steroid induced P:   SSI  Dispo:  - goal d/c home Thursday or Friday  Levy Pupa, MD, PhD 02/14/2013, 11:48 AM [place] [facility] [phone] or if no answer [phone]

## 2013-02-14 NOTE — Progress Notes (Signed)
Consult received for me to check on cost of Pulmicort and Brovana. Met with pt to discuss this. He stated he is already on Pulmicort at home and does get assistance from the [employer]. Rosalyn Gess would cost him $286.00 a month at [facility], which was the cheapest after calling around. I called the manufacturer [employer] and they stated they do not have a patient assistance program. I informed pt of the cost and he stated he cannot afford this and would rather stay on the medications he was on when he came to the hospital.   Pt does not have insurance for prescription coverage but plans on looking into it.

## 2013-02-15 LAB — GLUCOSE, CAPILLARY
Glucose-Capillary: 136 mg/dL — ABNORMAL HIGH (ref 70–99)
Glucose-Capillary: 133 mg/dL — ABNORMAL HIGH (ref 70–99)

## 2013-02-15 LAB — BASIC METABOLIC PANEL
BUN: 16 mg/dL (ref 6–23)
Calcium: 8.6 mg/dL (ref 8.4–10.5)
Chloride: 98 mEq/L (ref 96–112)
Creatinine, Ser: 0.74 mg/dL (ref 0.50–1.35)
GFR calc Af Amer: >90 mL/min (ref >90)

## 2013-02-15 LAB — CBC
HCT: 38.2 % — ABNORMAL LOW (ref 39.0–52.0)
MCHC: 33.2 g/dL (ref 30.0–36.0)
MCV: 88.8 fL (ref 78.0–100.0)
Platelets: 134 10*3/uL — ABNORMAL LOW (ref 150–400)
RDW: 13.6 % (ref 11.5–15.5)
WBC: 7.5 10*3/uL (ref 4.0–10.5)

## 2013-02-15 MED ORDER — IPRATROPIUM BROMIDE 0.02 % IN SOLN
0.5000 mg | Freq: Four times a day (QID) | RESPIRATORY_TRACT | Status: DC
  Administered 2013-02-15 – 2013-02-16 (×5): 0.5 mg via RESPIRATORY_TRACT
  Filled 2013-02-15 (×5): qty 2.5

## 2013-02-15 MED ORDER — ALBUTEROL SULFATE (5 MG/ML) 0.5% IN NEBU
2.5000 mg | INHALATION_SOLUTION | Freq: Four times a day (QID) | RESPIRATORY_TRACT | Status: DC
  Administered 2013-02-15 – 2013-02-16 (×5): 2.5 mg via RESPIRATORY_TRACT
  Filled 2013-02-15 (×4): qty 0.5

## 2013-02-15 NOTE — Progress Notes (Signed)
PULMONARY  / CRITICAL CARE MEDICINE  Name: [name] MRN: [id number] DOB: November 28, 1951    ADMISSION DATE:  02/05/2013 CONSULTATION DATE:  02/07/2013  REFERRING MD :  Elisabeth Pigeon   CHIEF COMPLAINT:  SOB/ Respiratory failure  BRIEF PATIENT DESCRIPTION:  62 y.o. male with hx of severe COPD and is on chronic steroids. He is an active smoker with h/o HTN, Hyperlipidemia and CAD. Presented 2/17 with increase shortness of breath and wheezing, despite increase nebulizer use.  He was admitted and started on Levaquin. Symptoms continued to exacerbate until early 2/18 when he was unable to catch his breath and was receiving respiratory treatments q2hours and he became acidotic. He was then transferred to ICU for critical care evaluation.   SIGNIFICANT EVENTS / STUDIES:   2/19: 2D Echo - Left ventricle: The cavity size was normal. Systolic function was normal. The estimated ejection fraction was in the range of 55% to 60%. Doppler parameters are consistent with abnormal left ventricular relaxation (grade 1 diastolic dysfunction).  CULTURES: BCx2 >>> Flu PCR (2/18) -  Neg Resp Virus Panel 2/19 - RSV B  ANTIBIOTICS: Levaquin 2/18>>> 2/22 Tamiflu 2/19-2/20  INTERVAL HISTORY: Note cost of the Brovana as an outpt, appreciate Case manager's help here.   VITAL SIGNS: Temp:  [96.8 F (36 C)-98.1 F (36.7 C)] 96.8 F (36 C) (02/27 0618) Pulse Rate:  [79-91] 82 (02/27 0618) Resp:  [18-21] 18 (02/27 0618) BP: (103-155)/(71-78) 155/78 mmHg (02/27 0618) SpO2:  [94 %-97 %] 97 % (02/27 0618) 3 liters    INTAKE / OUTPUT: Intake/Output     02/26 0701 - 02/27 0700 02/27 0701 - 02/28 0700   P.O. 240 240   Total Intake(mL/kg) 240 (2.8) 240 (2.8)   Urine (mL/kg/hr) 1275 (0.6)    Total Output 1275     Net -1035 +240          PHYSICAL EXAMINATION: General:  ill-appearing male Neuro:  A&Ox4, answers questions appropriately. Follows commands.  No Focal deficits.  HEENT:  PERRL, neck supple, no JVD. +  UAW wheeze  Cardiovascular:  S1 S2 intact, no rub, gallop, murmur. Lungs:  More distant but wheezing much improved Abdomen:  Soft, non-tender, non-distended. Musculoskeletal:  Intact  Skin:  Intact  LABS:  Recent Labs Lab 02/10/13 0342 02/11/13 0350 02/13/13 0505 02/15/13 0525  NA 136 140  --  140  K 4.7 3.8  --  3.8  CL 94* 94*  --  98  CO2 38* 41*  --  37*  BUN 25* 19  --  16  CREATININE 0.92 0.84 0.77 0.74  GLUCOSE 165* 86  --  94    Recent Labs Lab 02/09/13 0335 02/15/13 0525  HGB 13.3 12.7*  HCT 41.0 38.2*  WBC 7.4 7.5  PLT 156 134*      Recent Labs Lab 02/13/13 2136 02/14/13 0745 02/14/13 1151 02/14/13 1636 02/14/13 2142  GLUCAP 150* 90 167* 155* 126*    CXR: 2/22 no infiltrates  ASSESSMENT / PLAN:  Acute Respiratory Failure with hypercarbia- likely cause is ab/copd exacerbation in the setting of acute upper respiratory infection. Possible that pulmonary edema may play a role from diastolic chf  Acute Exacerbation COPD in setting of RSV B.  P:   Cont BD-- on budesonide; brovana was added but case management has investigated and this will cost him $286, which he cannot afford. We will change him back to duonebs qid, which was his prior home regimen Tapered steroids to 40mg  daily 2/25, continue  to wean slowly Wean FIO2 Tobacco cessation critical at this point - not sure he will be willing to do so  History of CAD - Troponin x 3 negative 2/19 for c/o chest pain worse with deep breathing. P:  Supportive Care lasix stopped 2/23 am, follow I/O   Hyperglycemia likely steroid induced P:   SSI  Dispo:  - goal d/c home Friday 2/28  Levy Pupa, MD, PhD 02/15/2013, 11:37 AM [place] [facility] [phone] or if no answer [phone]

## 2013-02-16 LAB — GLUCOSE, CAPILLARY: Glucose-Capillary: 116 mg/dL — ABNORMAL HIGH (ref 70–99)

## 2013-02-16 MED ORDER — HYDRALAZINE HCL 25 MG PO TABS: 25.0000 mg | ORAL_TABLET | Freq: Three times a day (TID) | ORAL | Status: DC

## 2013-02-16 MED ORDER — ISOSORBIDE MONONITRATE ER 30 MG PO TB24: 30.0000 mg | ORAL_TABLET | Freq: Every day | ORAL | Status: DC

## 2013-02-16 MED ORDER — ALBUTEROL SULFATE (2.5 MG/3ML) 0.083% IN NEBU: 2.5000 mg | INHALATION_SOLUTION | Freq: Four times a day (QID) | RESPIRATORY_TRACT | Status: DC

## 2013-02-16 MED ORDER — ALBUTEROL SULFATE (5 MG/ML) 0.5% IN NEBU: 2.5000 mg | INHALATION_SOLUTION | RESPIRATORY_TRACT | Status: DC | PRN

## 2013-02-16 MED ORDER — GUAIFENESIN ER 600 MG PO TB12: 1200.0000 mg | ORAL_TABLET | Freq: Two times a day (BID) | ORAL | Status: DC

## 2013-02-16 MED ORDER — PREDNISONE 10 MG PO TABS: ORAL_TABLET | ORAL | Status: DC

## 2013-02-16 MED ORDER — PANTOPRAZOLE SODIUM 40 MG PO TBEC: 40.0000 mg | DELAYED_RELEASE_TABLET | Freq: Every day | ORAL | Status: DC

## 2013-02-16 NOTE — Evaluation (Signed)
Physical Therapy Evaluation Patient Details Name: [name] MRN: [id number] DOB: October 03, 1951 Today's Date: 02/16/2013 Time: 4098-1191 PT Time Calculation (min): 30 min   SATURATION QUALIFICATIONS: (This note is used to comply with regulatory documentation for home oxygen)  Patient Saturations on Room Air at Rest = 93%  Patient Saturations on Room Air while Ambulating = 90%  Patient Saturations on 2 Liters of oxygen while Ambulating = 93%   PT Assessment / Plan / Recommendation Clinical Impression  62 yo male admitted with acute resp failure, acute resp infection. Hx of COPD. On eval pt required Min assist for balance during ambulation. Recommended pt consider using cane for improved stability. Recommend HHPT for balance training.     PT Assessment  Patient needs continued PT services    Follow Up Recommendations  Home health PT    Does the patient have the potential to tolerate intense rehabilitation      Barriers to Discharge        Equipment Recommendations  None recommended by PT    Recommendations for Other Services     Frequency Min 3X/week    Precautions / Restrictions Precautions Precautions: Fall Restrictions Weight Bearing Restrictions: No   Pertinent Vitals/Pain No c/o pain      Mobility  Bed Mobility Bed Mobility: Sit to Supine;Supine to Sit Supine to Sit: 6: Modified independent (Device/Increase time) Transfers Transfers: Sit to Stand;Stand to Sit Sit to Stand: 5: Supervision;From bed Stand to Sit: 5: Supervision;To bed Ambulation/Gait Ambulation/Gait Assistance: 4: Min assist Ambulation Distance (Feet): 150 Feet (100'x2, 150'x1) Assistive device: None Ambulation/Gait Assistance Details: Slow gait speed. LOB x 2 during ambulation, especially turns changes in direction. Pt occasionally reaching out for handrail to hold onto. Audible wheezing and dyspnea 3/4 with ambulation. Seated rest breaks between walks.  Gait Pattern: Step-through  pattern;Decreased stride length;Lateral trunk lean to right    Exercises     PT Diagnosis: Difficulty walking;Abnormality of gait  PT Problem List: Decreased activity tolerance;Decreased balance;Decreased mobility PT Treatment Interventions: DME instruction;Gait training;Stair training;Functional mobility training;Therapeutic activities;Therapeutic exercise;Balance training;Wheelchair mobility training   PT Goals Acute Rehab PT Goals PT Goal Formulation: With patient Time For Goal Achievement: 02/23/13 Potential to Achieve Goals: Good Pt will go Sit to Stand: with modified independence PT Goal: Sit to Stand - Progress: Goal set today Pt will Ambulate: >150 feet;with modified independence;with least restrictive assistive device PT Goal: Ambulate - Progress: Goal set today Pt will Go Up / Down Stairs: 3-5 stairs;with rail(s);with modified independence PT Goal: Up/Down Stairs - Progress: Goal set today  Visit Information  Last PT Received On: 02/16/13 Assistance Needed: +1    Subjective Data  Subjective: It usually drops down lower than that Patient Stated Goal: breathe better. home   Prior Functioning  Home Living Lives With: Spouse;Daughter Type of Home: House Home Access: Stairs to enter Secretary/administrator of Steps: 3 Entrance Stairs-Rails: Right Home Layout: One level Bathroom Shower/Tub: Tub/shower unit Home Adaptive Equipment: Walker - rolling Prior Function Level of Independence: Independent Able to Take Stairs?: Yes Communication Communication: No difficulties    Cognition  Cognition Overall Cognitive Status: Appears within functional limits for tasks assessed/performed Arousal/Alertness: Awake/alert Orientation Level: Appears intact for tasks assessed Behavior During Session: [facility] for tasks performed    Extremity/Trunk Assessment Right Lower Extremity Assessment RLE ROM/Strength/Tone: Deficits RLE ROM/Strength/Tone Deficits: leg length discrepancy, R  shorter than L-hx of osteomyelitis in hip Left Lower Extremity Assessment LLE ROM/Strength/Tone: Deficits LLE ROM/Strength/Tone Deficits: Strength at least 4/5  with functional activity Trunk Assessment Trunk Assessment: Other exceptions (R trunk listing) Trunk Exceptions: R trunk listing   Balance    End of Session PT - End of Session Equipment Utilized During Treatment: Gait belt Activity Tolerance: Other (comment) (Limited by wheezing, SOb at times) Patient left: in bed;with call bell/phone within reach  GP     Rebeca Alert [facility] 02/16/2013, 11:25 AM [phone]

## 2013-02-16 NOTE — Progress Notes (Signed)
Order for HHPT/RN services notes, met with pt to discuss this, he declines these services at this times. He does not feel it would be beneficial to him. I advised him to contact his dr's office if he changes his mind after he gets home.

## 2013-02-16 NOTE — Progress Notes (Signed)
Voices understanding of d/c instructions.  No changes noted since am assessment.   

## 2013-02-16 NOTE — Discharge Summary (Signed)
Physician Discharge Summary  Patient ID: AUBREY VOONG MRN: [id number] DOB/AGE: [birth year] 62 y.o.  Admit date: 02/05/2013 Discharge date: 02/16/2013    Discharge Diagnoses:  Acute respiratory failure with hypoxia Tobacco Abuse COPD with acute exacerbation RSV B Viral Illness Hyperlipidemia CAD HTN Chest Pain     Brief Summary: ALISTAIR SENFT is a 62 y.o. y/o male with a PMH of severe COPD and is on chronic steroids. He is an active smoker with h/o HTN, Hyperlipidemia and CAD. Presented 2/17 with increase shortness of breath and wheezing, despite increase nebulizer use. He was admitted and started on Levaquin. Symptoms continued to exacerbate until early 2/18 when he was unable to catch his breath and was receiving respiratory treatments q2hours and he became acidotic. He also complained of chest pain during that time.  He was treated with IV antibiotics (see below), antivirals, oxygen and nebulized bronchodilators for acute exacerbation of COPD in setting of RSV B viral illness.  Chest pain was evaluated with negative enzymes and ECHO as below.  Pain thought related to pulmonary exacerbation.  Home regimen was evaluated prior to discharge and he is unable to afford cost of brovana / pulmicort and will be discharged on previous home regimen.     Microbiology/Sepsis markers: 2/19 MRSA PCR>>>neg 2/19 Resp Virus Panel>>>RSV B positive 2/19 BCx2>>>neg  Significant Diagnostic Studies:  2/19: 2D Echo - Left ventricle: The cavity size was normal. Systolic function was normal. The estimated ejection fraction was in the range of 55% to 60%. Doppler parameters are consistent with abnormal left ventricular relaxation (grade 1 diastolic dysfunction).  ANTIBIOTICS:  Levaquin 2/18>>> 2/22  Tamiflu 2/19-2/20                                                                      Hospital Summary by Discharge Diagnosis  Acute respiratory failure with hypoxia / hypercarbia Tobacco Abuse COPD  with acute exacerbation RSV B Viral Illness Admitted 2/17 with productive cough, myalgias, blood tinged sputum. Found to have acute exacerbation of COPD in the setting of upper respiratory infection.  Respiratory viral cultures were positive for RSV B virus. All other cultures negative to date. He was treated with steroids, anti-virals, nebulized bronchodilators and oxygen with improvement in symptoms.    Discharge Plan: -continue home duonebs.  Medication regimen was evaluated in terms of cost and he can not afford brovona / budesonide  -taper steroids slowly -follow up with Dr. Delton Coombes in office   Hyperlipidemia CAD HTN Chest Pain Developed chest pain in the setting of AECOPD.  Troponin cycled and negative x3.  ECHO assessed which demonstrated EF 55-60%, nml systolic function, and grade 1 diastolic dysfunction.    Discharge Plan: -continue ASA, plavix, imdur, zocor -follow up with Cardiology as needed   Discharge Exam: General: chronically ill-appearing male  Neuro: A&Ox4, answers questions appropriately. Follows commands. No Focal deficits.  HEENT: PERRL, neck supple, no JVD. + UAW wheeze  Cardiovascular: S1 S2 intact, no rub, gallop, murmur.  Lungs: More distant no wheeze Abdomen: Soft, non-tender, non-distended.  Musculoskeletal: Intact  Skin: Intact   Filed Vitals:   02/16/13 0600 02/16/13 0750 02/16/13 0923 02/16/13 1158  BP: 121/70 123/68  120/80  Pulse: 87 84  77  Temp: 97.2 F (  36.2 C) 97.2 F (36.2 C)  97.5 F (36.4 C)  TempSrc: Oral Oral  Oral  Resp: 18 18    Height:      Weight:      SpO2: 95% 97% 96% 95%   2 liters   Discharge Labs  BMET  Recent Labs Lab 02/10/13 0342 02/11/13 0350 02/13/13 0505 02/15/13 0525  NA 136 140  --  140  K 4.7 3.8  --  3.8  CL 94* 94*  --  98  CO2 38* 41*  --  37*  GLUCOSE 165* 86  --  94  BUN 25* 19  --  16  CREATININE 0.92 0.84 0.77 0.74  CALCIUM 8.6 9.0  --  8.6  MG 2.5  --   --   --   PHOS 3.4  --   --   --     CBC  Recent Labs Lab 02/15/13 0525  HGB 12.7*  HCT 38.2*  WBC 7.5  PLT 134*       Medication List    TAKE these medications       albuterol (2.5 MG/3ML) 0.083% nebulizer solution  Commonly known as:  PROVENTIL  Take 3 mLs (2.5 mg total) by nebulization 4 (four) times daily.     albuterol (5 MG/ML) 0.5% nebulizer solution  Commonly known as:  PROVENTIL  Take 0.5 mLs (2.5 mg total) by nebulization every 3 (three) hours as needed for wheezing or shortness of breath.     aspirin 325 MG tablet  Take 650 mg by mouth every morning.     budesonide 0.25 MG/2ML nebulizer solution  Commonly known as:  PULMICORT  Take 2 mLs (0.25 mg total) by nebulization 2 (two) times daily.     clopidogrel 75 MG tablet  Commonly known as:  PLAVIX  Take 75 mg by mouth daily.     furosemide 20 MG tablet  Commonly known as:  LASIX  Take 1 tablet (20 mg total) by mouth daily.     guaiFENesin 600 MG 12 hr tablet  Commonly known as:  MUCINEX  Take 2 tablets (1,200 mg total) by mouth 2 (two) times daily.     hydrALAZINE 25 MG tablet  Commonly known as:  APRESOLINE  Take 1 tablet (25 mg total) by mouth every 8 (eight) hours.     ipratropium 0.02 % nebulizer solution  Commonly known as:  ATROVENT  Take 2.5 mLs (500 mcg total) by nebulization 4 (four) times daily.     isosorbide mononitrate 30 MG 24 hr tablet  Commonly known as:  IMDUR  Take 1 tablet (30 mg total) by mouth daily.     pantoprazole 40 MG tablet  Commonly known as:  PROTONIX  Take 1 tablet (40 mg total) by mouth daily.     predniSONE 10 MG tablet  Commonly known as:  DELTASONE  Take 4 tabs qd x 4 days, then 3 tabs qd x 4 days, then 2 tabs  Qd x 4 days, then 1 tab daily and hold at this dosing     simvastatin 20 MG tablet  Commonly known as:  ZOCOR  Take 1 tablet (20 mg total) by mouth every evening.        Follow-up Information   Follow up with Leslye Peer., MD On 02/22/2013. (Appt at 2:30)    Contact  information:   [address] [place] [address] [phone]        Disposition: 01-Home or Self Care  Discharged  Condition: RUTH TULLY has met maximum benefit of inpatient care and is medically stable and cleared for discharge.  Patient is pending follow up as above.      Time spent on disposition:  Greater than 35 minutes.   [name] ACNP-BC [facility] Pager # [phone] OR # [phone] if no answer     Levy Pupa, MD, PhD 02/16/2013, 1:33 PM [place] [facility] [phone] or if no answer [phone]

## 2013-02-22 ENCOUNTER — Encounter: Payer: Self-pay | Admitting: Emergency Medicine

## 2013-02-22 ENCOUNTER — Ambulatory Visit (INDEPENDENT_AMBULATORY_CARE_PROVIDER_SITE_OTHER): Payer: Medicare Other | Admitting: Emergency Medicine

## 2013-02-22 VITALS — BP 132/80 | HR 90 | Temp 97.4°F | Ht 71.0 in | Wt 195.0 lb

## 2013-02-22 DIAGNOSIS — F17200 Nicotine dependence, unspecified, uncomplicated: Secondary | ICD-10-CM

## 2013-02-22 DIAGNOSIS — J441 Chronic obstructive pulmonary disease with (acute) exacerbation: Secondary | ICD-10-CM

## 2013-02-22 DIAGNOSIS — N50812 Left testicular pain: Secondary | ICD-10-CM | POA: Insufficient documentation

## 2013-02-22 DIAGNOSIS — J449 Chronic obstructive pulmonary disease, unspecified: Secondary | ICD-10-CM

## 2013-02-22 DIAGNOSIS — N509 Disorder of male genital organs, unspecified: Secondary | ICD-10-CM

## 2013-02-22 MED ORDER — BUDESONIDE 0.25 MG/2ML IN SUSP: 0.2500 mg | Freq: Two times a day (BID) | RESPIRATORY_TRACT | Status: DC

## 2013-02-22 MED ORDER — ALBUTEROL SULFATE (2.5 MG/3ML) 0.083% IN NEBU: 2.5000 mg | INHALATION_SOLUTION | Freq: Four times a day (QID) | RESPIRATORY_TRACT | Status: DC

## 2013-02-22 MED ORDER — ARFORMOTEROL TARTRATE 15 MCG/2ML IN NEBU: 15.0000 ug | INHALATION_SOLUTION | Freq: Two times a day (BID) | RESPIRATORY_TRACT | Status: DC

## 2013-02-22 NOTE — Assessment & Plan Note (Signed)
Working on quitting

## 2013-02-22 NOTE — Patient Instructions (Addendum)
Please continue your DuoNebs and Pulmicort on a schedule Use albuterol as needed Wear your oxygen with exertion and while sleeping We will set you up with Urology Follow with Dr Delton Coombes in 3 months or sooner if you have any problems.

## 2013-02-22 NOTE — Assessment & Plan Note (Signed)
This is the first time he has mentioned this problem - etiology not clear. ? A testicular injury, ? A hernia.  - will ask urology to evaluate.

## 2013-02-22 NOTE — Assessment & Plan Note (Signed)
-   continue current regimen, but will look into possibly getting Brovana assistance finacially

## 2013-02-22 NOTE — Addendum Note (Signed)
Addended by: Orma Flaming D on: 02/22/2013 03:37 PM   Modules accepted: Orders

## 2013-02-22 NOTE — Progress Notes (Signed)
  Subjective:  Patient ID: [name], male    DOB: 1951/04/20, 62 y.o.   MRN: [id number] HPI 62 yo smoker, hx CAD/stents, COPD, admitted 01/2011 for acute exacerbation in setting suspected viral pneumonitis. Current regimen is albuterol/atrovent nebs 2 -3 x a day, budesonide neds bid. He believes that his breathing has declined some since his oral steroids. His breathing is worst with bending over, with exertion.   ROV 07/31/12 -- COPD, continued smoking, frequent exacerbations. Having more nocturnal wheeze compared with last time. No exacerbations since last time. He has been on DuoNebs and Pulmicort Nebs, but he ran out of pulmicort about a month ago - can tell that he misses it. 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   10/26/12 Acute OV  Complains of  wheezing, chest tightness, prod cough w/ yellow phlem, and increased SOB x 1 week. Patient was called in a prednisone taper. 3 days ago. Does feel the symptoms are starting to get better, but still has a congested cough with thick mucus  He denies any hemoptysis, orthopnea, PND, or leg swelling  ROV 12/27/12 -- Hx tobacco (continued smoking, down to 5-6 cig a week), COPD, CAD. Treated as above for AE in 11/13, then again by phone on 11/24/12. He returns today for regular follow up. Regimen is DuoNebs qid + Pulmicort bid. 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 He improved on prednisone, but now that it has finished he is experiencing chest soreness from dyspnea, cough in the am - yellowish phlegm. Exertional SOB is worsening.    ROV 02/22/13 -- severe COPD, continued tobacco, CAD. He was admitted 2/17-2/28 for AE-COPD and resp failure. Returns feeling improved. Still smoking 1/2 pk a week. On DuoNebs  Qid + pulmicort nebs bid.  He is trying the e-cig, hasn't stopped. He has exertional SOB, some wheeze. He has O2 that he uses qhs, with some exertion.    As an aside today he mentions that he has been having some longstanding pain in his L testicle, ? Worse when he was exacerbated. He says no anatomical abnormality, nodule, etc.    CAT Score 02/22/2013 12/27/2012  Total CAT Score 26 29    Objective:   Filed Vitals:   02/22/13 1430  BP: 132/80  Pulse: 90  Temp: 97.4 F (36.3 C)   Gen: Pleasant, in no distress,  normal affect  ENT: No lesions,  mouth clear,  oropharynx clear, no postnasal drip  Neck: No JVD, no TMG, no carotid bruits  Lungs: Few rhonchi w/o  use of accessory muscles, distant, no wheezing  Cardiovascular: RRR, heart sounds normal, no murmur or gallops, no peripheral edema  Musculoskeletal: No deformities, no cyanosis or clubbing  Neuro: alert, non focal  Skin: Warm, no lesions or rashes   Assessment & Plan:  Testicular pain, left This is the first time he has mentioned this problem - etiology not clear. ? A testicular injury, ? A hernia.  - will ask urology to evaluate.   TOBACCO USER Working on quitting  COPD (chronic obstructive pulmonary disease) - continue current regimen, but will look into possibly getting Brovana assistance finacially

## 2013-03-22 ENCOUNTER — Other Ambulatory Visit: Payer: Self-pay | Admitting: Emergency Medicine

## 2013-03-23 ENCOUNTER — Telehealth: Payer: Self-pay | Admitting: Emergency Medicine

## 2013-03-23 MED ORDER — IPRATROPIUM BROMIDE 0.02 % IN SOLN: 500.0000 ug | Freq: Four times a day (QID) | RESPIRATORY_TRACT | Status: DC

## 2013-03-23 NOTE — Telephone Encounter (Signed)
Rx has been sent in. Pt is aware. 

## 2013-04-26 ENCOUNTER — Other Ambulatory Visit: Payer: Self-pay | Admitting: Emergency Medicine

## 2013-04-26 ENCOUNTER — Telehealth: Payer: Self-pay | Admitting: Emergency Medicine

## 2013-04-26 NOTE — Telephone Encounter (Signed)
Spoke with pt requesting rx and a increase in his prednisone from 10mg  to 20mg  . Pt is having increase sob,wheezing, PT feels that doing 20mg  a day helps him better. Allergies  Allergen Reactions  . Lipitor (Atorvastatin Calcium)     Muscle aches   Dr Delton Coombes please advise Thank you

## 2013-04-26 NOTE — Telephone Encounter (Signed)
Received Rx request for prednisone 10mg  daily Dr. Delton Coombes please advise if patient is/was to continue on prednisone I can not tell OV on 12/27/12 says for pt to do 10mg  until next visit 02/22/13 and nothing was mentioned about pred at that visit Please advise, thank you

## 2013-04-26 NOTE — Telephone Encounter (Signed)
ATC patient, patient is currently not at home Left msg for patient to return call

## 2013-04-26 NOTE — Telephone Encounter (Signed)
Pt states that he has stopped smoking. I have scheduled him to follow up on 05/24/13 @ 1:30pm. He requests a new rx to be sent in to reflect the change in the dosage.

## 2013-04-26 NOTE — Telephone Encounter (Signed)
It is difficult to justify increasing his prednisone when he has not stopped smoking. Has he stopped? I suspect not. He can increase to 20mg  for now, make him an OV with me. He can stay on 20mg  until I see him to discuss. I won't continue to fill it otherwise.

## 2013-04-26 NOTE — Telephone Encounter (Signed)
Returning call to the nurse [phone]

## 2013-04-27 ENCOUNTER — Telehealth: Payer: Self-pay | Admitting: Emergency Medicine

## 2013-04-27 NOTE — Telephone Encounter (Signed)
Spoke with patient-- I have informed him that I have received pred rx request  And that I am waiting for RB to approve patient ro remain on prednisone as I can not clearly tell for OV notes. Will forward to my box to follow up on

## 2013-04-27 NOTE — Telephone Encounter (Signed)
Rx has been sent in by RB Contacted patient to make him aware Nothing further needed at this time

## 2013-05-09 ENCOUNTER — Other Ambulatory Visit: Payer: Self-pay | Admitting: Emergency Medicine

## 2013-05-09 MED ORDER — ALBUTEROL SULFATE (2.5 MG/3ML) 0.083% IN NEBU: 2.5000 mg | INHALATION_SOLUTION | Freq: Four times a day (QID) | RESPIRATORY_TRACT | Status: DC

## 2013-05-24 ENCOUNTER — Encounter: Payer: Self-pay | Admitting: Emergency Medicine

## 2013-05-24 ENCOUNTER — Ambulatory Visit (INDEPENDENT_AMBULATORY_CARE_PROVIDER_SITE_OTHER): Payer: Self-pay | Admitting: Emergency Medicine

## 2013-05-24 VITALS — BP 102/68 | HR 86 | Temp 98.0°F | Ht 71.0 in | Wt 199.2 lb

## 2013-05-24 DIAGNOSIS — J449 Chronic obstructive pulmonary disease, unspecified: Secondary | ICD-10-CM

## 2013-05-24 DIAGNOSIS — F17200 Nicotine dependence, unspecified, uncomplicated: Secondary | ICD-10-CM

## 2013-05-24 MED ORDER — PREDNISONE 10 MG PO TABS: 20.0000 mg | ORAL_TABLET | Freq: Every day | ORAL | Status: DC

## 2013-05-24 NOTE — Progress Notes (Signed)
Subjective:  Patient ID: [name], male    DOB: 01-09-1951, 62 y.o.   MRN: [id number] HPI 62 yo smoker, hx CAD/stents, COPD, admitted 01/2011 for acute exacerbation in setting suspected viral pneumonitis. Current regimen is albuterol/atrovent nebs 2 -3 x a day, budesonide neds bid. He believes that his breathing has declined some since his oral steroids. His breathing is worst with bending over, with exertion.   ROV 07/31/12 -- COPD, continued smoking, frequent exacerbations. Having more nocturnal wheeze compared with last time. No exacerbations since last time. He has been on DuoNebs and Pulmicort Nebs, but he ran out of pulmicort about a month ago - can tell that he misses it. 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   10/26/12 Acute OV  Complains of  wheezing, chest tightness, prod cough w/ yellow phlem, and increased SOB x 1 week. Patient was called in a prednisone taper. 3 days ago. Does feel the symptoms are starting to get better, but still has a congested cough with thick mucus  He denies any hemoptysis, orthopnea, PND, or leg swelling  ROV 12/27/12 -- Hx tobacco (continued smoking, down to 5-6 cig a week), COPD, CAD. Treated as above for AE in 11/13, then again by phone on 11/24/12. He returns today for regular follow up. Regimen is DuoNebs qid + Pulmicort bid. 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 He improved on prednisone, but now that it has finished he is experiencing chest soreness from dyspnea, cough in the am - yellowish phlegm. Exertional SOB is worsening.    ROV 02/22/13 -- severe COPD, continued tobacco, CAD. He was admitted 2/17-2/28 for AE-COPD and resp failure. Returns feeling improved. Still smoking 1/2 pk a week. On DuoNebs  Qid + pulmicort nebs bid.  He is trying the e-cig, hasn't stopped. He has exertional SOB, some wheeze. He has O2 that he uses qhs, with some exertion.    As an aside today he mentions that he has been having some longstanding pain in his L testicle, ? Worse when he was exacerbated. He says no anatomical abnormality, nodule, etc.   ROV 05/24/13 -- continued tobacco, severe COPD, CAD, hypoxemia. Follow up visit.  Has been on chronic pred. We discussed increase to 20mg , but not done yet, still on 10mg . He quit smoking for a period of time, about 1 month. Then had to go back to about 1 cig that he stretches out for the whole day puff by puff. He wants to try new version of e-cig to see if he can stop again. He looked into [employer], hasn't been started because the company said they couldn't ship without insurance.    CAT Score 02/22/2013 12/27/2012  Total CAT Score 26 29    Objective:   Filed Vitals:   05/24/13 1336  BP: 102/68  Pulse: 86  Temp: 98 F (36.7 C)  TempSrc: Oral  Height: 5\' 11"  (1.803 m)  Weight: 199 lb 3.2 oz (90.357 kg)  SpO2: 91%   Gen: Pleasant, in no distress,  normal affect  ENT: No lesions,  mouth clear,  oropharynx clear, no postnasal drip  Neck: No JVD, no TMG, no carotid bruits  Lungs: Few rhonchi w/o  use of accessory muscles, distant, no wheezing  Cardiovascular: RRR, heart sounds normal, no murmur or gallops, no peripheral edema  Musculoskeletal: No deformities, no cyanosis or clubbing  Neuro: alert, non  focal  Skin: Warm, no lesions or rashes   Assessment & Plan:  COPD (chronic obstructive pulmonary disease) Stable since last visit. - will continue to try to get brovana.  - continue pulmicort and duonebs - continue to work on smoking cessation.  - increase prednisone to 20mg  x 1 month, then plan to decrease slowly back to lowest tolerated dose.   TOBACCO USER - Discussed cessation, will try to supplement with nicotine patches 7mg  to allow him to stop completely

## 2013-05-24 NOTE — Assessment & Plan Note (Signed)
-   Discussed cessation, will try to supplement with nicotine patches 7mg  to allow him to stop completely

## 2013-05-24 NOTE — Patient Instructions (Addendum)
Please continue your Duonebs and Pulmicort nebs as you are taking them We will work on determining why you cannot get Brovana Increase your prednisone to 20mg  daily until next visit. We will then try to slowly decrease your dose Continue to work on stopping smoking. Try using nicotine patches 7mg  to allow you to quit completely Follow with Dr Delton Coombes in 1 month

## 2013-05-24 NOTE — Assessment & Plan Note (Addendum)
Stable since last visit. - will continue to try to get brovana.  - continue pulmicort and duonebs - continue to work on smoking cessation.  - increase prednisone to 20mg  x 1 month, then plan to decrease slowly back to lowest tolerated dose.

## 2013-06-21 ENCOUNTER — Telehealth: Payer: Self-pay | Admitting: Emergency Medicine

## 2013-06-21 ENCOUNTER — Other Ambulatory Visit: Payer: Self-pay | Admitting: Emergency Medicine

## 2013-06-21 MED ORDER — IPRATROPIUM BROMIDE 0.02 % IN SOLN: 500.0000 ug | Freq: Four times a day (QID) | RESPIRATORY_TRACT | Status: DC

## 2013-06-21 NOTE — Telephone Encounter (Signed)
Pt is aware RX has been sent for month supply. Nothing further was needed

## 2013-06-28 ENCOUNTER — Ambulatory Visit (INDEPENDENT_AMBULATORY_CARE_PROVIDER_SITE_OTHER): Payer: Self-pay | Admitting: Emergency Medicine

## 2013-06-28 ENCOUNTER — Encounter: Payer: Self-pay | Admitting: Emergency Medicine

## 2013-06-28 VITALS — BP 122/70 | HR 79 | Temp 98.3°F | Ht 71.0 in | Wt 200.4 lb

## 2013-06-28 DIAGNOSIS — J449 Chronic obstructive pulmonary disease, unspecified: Secondary | ICD-10-CM

## 2013-06-28 MED ORDER — ALBUTEROL SULFATE (2.5 MG/3ML) 0.083% IN NEBU: 2.5000 mg | INHALATION_SOLUTION | Freq: Four times a day (QID) | RESPIRATORY_TRACT | Status: DC

## 2013-06-28 MED ORDER — BUDESONIDE 0.25 MG/2ML IN SUSP: 0.2500 mg | Freq: Two times a day (BID) | RESPIRATORY_TRACT | Status: DC

## 2013-06-28 MED ORDER — IPRATROPIUM BROMIDE 0.02 % IN SOLN: 500.0000 ug | Freq: Four times a day (QID) | RESPIRATORY_TRACT | Status: DC

## 2013-06-28 NOTE — Progress Notes (Signed)
Subjective:  Patient ID: [name], male    DOB: 01-02-1951, 62 y.o.   MRN: [id number] HPI 62 yo smoker, hx CAD/stents, COPD, admitted 01/2011 for acute exacerbation in setting suspected viral pneumonitis. Current regimen is albuterol/atrovent nebs 2 -3 x a day, budesonide neds bid. He believes that his breathing has declined some since his oral steroids. His breathing is worst with bending over, with exertion.   ROV 07/31/12 -- COPD, continued smoking, frequent exacerbations. Having more nocturnal wheeze compared with last time. No exacerbations since last time. He has been on DuoNebs and Pulmicort Nebs, but he ran out of pulmicort about a month ago - can tell that he misses it. 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   10/26/12 Acute OV  Complains of  wheezing, chest tightness, prod cough w/ yellow phlem, and increased SOB x 1 week. Patient was called in a prednisone taper. 3 days ago. Does feel the symptoms are starting to get better, but still has a congested cough with thick mucus  He denies any hemoptysis, orthopnea, PND, or leg swelling  ROV 12/27/12 -- Hx tobacco (continued smoking, down to 5-6 cig a week), COPD, CAD. Treated as above for AE in 11/13, then again by phone on 11/24/12. He returns today for regular follow up. Regimen is DuoNebs qid + Pulmicort bid. 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 He improved on prednisone, but now that it has finished he is experiencing chest soreness from dyspnea, cough in the am - yellowish phlegm. Exertional SOB is worsening.    ROV 02/22/13 -- severe COPD, continued tobacco, CAD. He was admitted 2/17-2/28 for AE-COPD and resp failure. Returns feeling improved. Still smoking 1/2 pk a week. On DuoNebs  Qid + pulmicort nebs bid.  He is trying the e-cig, hasn't stopped. He has exertional SOB, some wheeze. He has O2 that he uses qhs, with some exertion.    As an aside today he mentions that he has been having some longstanding pain in his L testicle, ? Worse when he was exacerbated. He says no anatomical abnormality, nodule, etc.   ROV 05/24/13 -- continued tobacco, severe COPD, CAD, hypoxemia. Follow up visit.  Has been on chronic pred. We discussed increase to 20mg , but not done yet, still on 10mg . He quit smoking for a period of time, about 1 month. Then had to go back to about 1 cig that he stretches out for the whole day puff by puff. He wants to try new version of e-cig to see if he can stop again. He looked into [employer], hasn't been started because the company said they couldn't ship without insurance.   ROV 06/28/13 -- continued tobacco, severe COPD, CAD, hypoxemia. Last time we increased pred to 20mg  daily. He is wearing o2 at 2-3 L/min w exertion. Unable to get brovana. Tried the nicotine gum briefly, didn't see much effect. He has cut cigs way down.    CAT Score 02/22/2013 12/27/2012  Total CAT Score 26 29    Objective:   Filed Vitals:   06/28/13 1620  BP: 122/70  Pulse: 79  Temp: 98.3 F (36.8 C)  TempSrc: Oral  Height: 5\' 11"  (1.803 m)  Weight: 200 lb 6.4 oz (90.901 kg)  SpO2: 90%   Gen: Pleasant, in no distress,  normal affect  ENT: No lesions,  mouth clear,  oropharynx clear, no postnasal drip  Neck: No JVD, no TMG, no carotid bruits  Lungs: Few rhonchi w/o  use of accessory muscles, distant, no wheezing  Cardiovascular: RRR, heart sounds normal, no murmur or gallops, no peripheral edema  Musculoskeletal: No deformities, no cyanosis or clubbing  Neuro: alert, non focal  Skin: Warm, no lesions or rashes   Assessment & Plan:  COPD (chronic obstructive pulmonary disease) - continue same regimen - will revisit decreasing pred next time - rov 3 mon

## 2013-06-28 NOTE — Patient Instructions (Addendum)
Please continue your current medications as you are taking them Continue to work on stopping smoking Follow with Dr Delton Coombes in 3 months or sooner if you have any problems.

## 2013-06-28 NOTE — Assessment & Plan Note (Signed)
-   continue same regimen - will revisit decreasing pred next time - rov 3 mon

## 2013-08-23 ENCOUNTER — Other Ambulatory Visit: Payer: Self-pay | Admitting: Emergency Medicine

## 2013-08-25 ENCOUNTER — Emergency Department (HOSPITAL_COMMUNITY): Payer: Medicare Other

## 2013-08-25 ENCOUNTER — Emergency Department (HOSPITAL_COMMUNITY)
Admission: EM | Admit: 2013-08-25 | Discharge: 2013-08-26 | Disposition: A | Discharge status: Home / Self Care | Payer: Medicare Other | Attending: Emergency Medicine | Admitting: Emergency Medicine

## 2013-08-25 ENCOUNTER — Encounter (HOSPITAL_COMMUNITY): Payer: Self-pay | Admitting: Emergency Medicine

## 2013-08-25 ENCOUNTER — Other Ambulatory Visit: Payer: Self-pay

## 2013-08-25 DIAGNOSIS — Z7982 Long term (current) use of aspirin: Secondary | ICD-10-CM | POA: Insufficient documentation

## 2013-08-25 DIAGNOSIS — J441 Chronic obstructive pulmonary disease with (acute) exacerbation: Secondary | ICD-10-CM | POA: Insufficient documentation

## 2013-08-25 DIAGNOSIS — E785 Hyperlipidemia, unspecified: Secondary | ICD-10-CM | POA: Insufficient documentation

## 2013-08-25 DIAGNOSIS — F17200 Nicotine dependence, unspecified, uncomplicated: Secondary | ICD-10-CM | POA: Insufficient documentation

## 2013-08-25 DIAGNOSIS — IMO0002 Reserved for concepts with insufficient information to code with codable children: Secondary | ICD-10-CM | POA: Insufficient documentation

## 2013-08-25 DIAGNOSIS — I2510 Atherosclerotic heart disease of native coronary artery without angina pectoris: Secondary | ICD-10-CM | POA: Insufficient documentation

## 2013-08-25 DIAGNOSIS — Z79899 Other long term (current) drug therapy: Secondary | ICD-10-CM | POA: Insufficient documentation

## 2013-08-25 DIAGNOSIS — I10 Essential (primary) hypertension: Secondary | ICD-10-CM | POA: Insufficient documentation

## 2013-08-25 DIAGNOSIS — J449 Chronic obstructive pulmonary disease, unspecified: Secondary | ICD-10-CM

## 2013-08-25 LAB — COMPREHENSIVE METABOLIC PANEL
ALT: 19 U/L (ref 0–53)
AST: 15 U/L (ref 0–37)
Albumin: 3.3 g/dL — ABNORMAL LOW (ref 3.5–5.2)
CO2: 29 mEq/L (ref 19–32)
Chloride: 105 mEq/L (ref 96–112)
Creatinine, Ser: 0.91 mg/dL (ref 0.50–1.35)
GFR calc non Af Amer: 88 mL/min — ABNORMAL LOW (ref >90)
Potassium: 3.8 mEq/L (ref 3.5–5.1)
Sodium: 143 mEq/L (ref 135–145)
Total Bilirubin: 0.1 mg/dL — ABNORMAL LOW (ref 0.3–1.2)

## 2013-08-25 LAB — CBC WITH DIFFERENTIAL/PLATELET
Basophils Absolute: 0 10*3/uL (ref 0.0–0.1)
Basophils Relative: 0 % (ref 0–1)
HCT: 41.3 % (ref 39.0–52.0)
Lymphocytes Relative: 17 % (ref 12–46)
MCHC: 33.2 g/dL (ref 30.0–36.0)
Neutro Abs: 8.2 10*3/uL — ABNORMAL HIGH (ref 1.7–7.7)
Neutrophils Relative %: 78 % — ABNORMAL HIGH (ref 43–77)
Platelets: 178 10*3/uL (ref 150–400)
RDW: 14.2 % (ref 11.5–15.5)
WBC: 10.6 10*3/uL — ABNORMAL HIGH (ref 4.0–10.5)

## 2013-08-25 MED ORDER — METHYLPREDNISOLONE SODIUM SUCC 125 MG IJ SOLR
60.0000 mg | INTRAMUSCULAR | Status: AC
  Administered 2013-08-26: 60 mg via INTRAVENOUS
  Filled 2013-08-25: qty 2

## 2013-08-25 MED ORDER — ASPIRIN 325 MG PO TABS
325.0000 mg | ORAL_TABLET | ORAL | Status: AC
  Administered 2013-08-26: 325 mg via ORAL
  Filled 2013-08-25: qty 1

## 2013-08-25 NOTE — ED Notes (Addendum)
Pt states that he has hed chest pain x 2.5 weeks. He had an episode of dizziness and numbness in his left hand Thursday night. Pt went to [facility] that night. Pt began having increased pain tonight and diaphoresis , nausea, and dizziness. Pt in NAD. A&O x 4. Pt took 3 x 325 mg aspirin at 2030 tonight.

## 2013-08-25 NOTE — ED Notes (Signed)
RT notified of wheeze protocol

## 2013-08-25 NOTE — ED Provider Notes (Signed)
CSN: [id number]     Arrival date & time 08/25/13  2229 History   First MD Initiated Contact with Patient 08/25/13 2320     Chief Complaint  Patient presents with  . Chest Pain    x 2.5 weeks   (Consider location/radiation/quality/duration/timing/severity/associated sxs/prior Treatment) Patient is a 62 y.o. male presenting with shortness of breath. The history is provided by the patient.  Shortness of Breath Severity:  Moderate Onset quality:  Gradual Duration:  1 month Timing:  Constant Progression:  Worsening Chronicity:  New Context: activity   Context comment:  Mild cough w/ clear sputum Relieved by:  Nothing Worsened by:  Activity Ineffective treatments:  Oxygen and inhaler Associated symptoms: no abdominal pain, no chest pain, no cough, no fever, no headaches, no neck pain and no vomiting     Past Medical History  Diagnosis Date  . COPD (chronic obstructive pulmonary disease)   . CAD (coronary artery disease)   . Hypertension   . Hyperlipidemia   . Osteomyelitis    Past Surgical History  Procedure Laterality Date  . Appendectomy     Family History  Problem Relation Age of Onset  . Coronary artery disease    . Hypertension     History  Substance Use Topics  . Smoking status: Current Some Day Smoker -- 0.30 packs/day for 40 years    Types: Cigarettes  . Smokeless tobacco: Never Used     Comment: approx .5 cigarettes per week   . Alcohol Use: No    Review of Systems  Constitutional: Negative for fever.  HENT: Negative for rhinorrhea, drooling and neck pain.   Eyes: Negative for pain.  Respiratory: Positive for chest tightness and shortness of breath. Negative for cough.   Cardiovascular: Negative for chest pain and leg swelling.  Gastrointestinal: Negative for nausea, vomiting, abdominal pain and diarrhea.  Genitourinary: Negative for dysuria and hematuria.  Musculoskeletal: Negative for gait problem.  Skin: Negative for color change.  Neurological:  Negative for numbness and headaches.  Hematological: Negative for adenopathy.  Psychiatric/Behavioral: Negative for behavioral problems.  All other systems reviewed and are negative.    Allergies  Lipitor  Home Medications   Current Outpatient Rx  Name  Route  Sig  Dispense  Refill  . albuterol (PROVENTIL) (2.5 MG/3ML) 0.083% nebulizer solution   Nebulization   Take 3 mLs (2.5 mg total) by nebulization 4 (four) times daily.   225 mL   11   . aspirin 325 MG tablet   Oral   Take 650 mg by mouth every morning.          . budesonide (PULMICORT) 0.25 MG/2ML nebulizer solution   Nebulization   Take 2 mLs (0.25 mg total) by nebulization 2 (two) times daily.   360 mL   11     Dx Code 496   . clopidogrel (PLAVIX) 75 MG tablet   Oral   Take 75 mg by mouth daily.           Marland Kitchen doxycycline (VIBRA-TABS) 100 MG tablet   Oral   Take 100 mg by mouth 2 (two) times daily.         . furosemide (LASIX) 20 MG tablet   Oral   Take 20 mg by mouth every morning.         Marland Kitchen ipratropium (ATROVENT) 0.02 % nebulizer solution   Nebulization   Take 2.5 mLs (500 mcg total) by nebulization 4 (four) times daily.   300 mL  11     Dx 496   . metoprolol (LOPRESSOR) 50 MG tablet   Oral   Take 50 mg by mouth 2 (two) times daily.          . predniSONE (DELTASONE) 10 MG tablet   Oral   Take 2 tablets (20 mg total) by mouth daily.   60 tablet   5     No refills available   . ranitidine (ZANTAC) 150 MG tablet   Oral   Take 150 mg by mouth 2 (two) times daily as needed for heartburn.         . simvastatin (ZOCOR) 20 MG tablet   Oral   Take 1 tablet (20 mg total) by mouth every evening.   30 tablet   11    There were no vitals taken for this visit. Physical Exam  Nursing note and vitals reviewed. Constitutional: He is oriented to person, place, and time. He appears well-developed and well-nourished.  HENT:  Head: Normocephalic and atraumatic.  Right Ear: External ear  normal.  Left Ear: External ear normal.  Nose: Nose normal.  Mouth/Throat: Oropharynx is clear and moist. No oropharyngeal exudate.  Eyes: Conjunctivae and EOM are normal. Pupils are equal, round, and reactive to light.  Neck: Normal range of motion. Neck supple.  Cardiovascular: Normal rate, regular rhythm, normal heart sounds and intact distal pulses.  Exam reveals no gallop and no friction rub.   No murmur heard. Pulmonary/Chest: Effort normal. No respiratory distress. He has wheezes (mild to moderate bilateral wheezing).  Abdominal: Soft. Bowel sounds are normal. He exhibits no distension. There is no tenderness. There is no rebound and no guarding.  Musculoskeletal: Normal range of motion. He exhibits edema (trace edema in bilateral lower distal extremities). He exhibits no tenderness.  Neurological: He is alert and oriented to person, place, and time.  Skin: Skin is warm and dry.  Psychiatric: He has a normal mood and affect. His behavior is normal.    ED Course  Procedures (including critical care time) Labs Review Labs Reviewed  CBC WITH DIFFERENTIAL  COMPREHENSIVE METABOLIC PANEL  TROPONIN I   Imaging Review Dg Chest 2 View  08/25/2013   *RADIOLOGY REPORT*  Clinical Data: Chest pain, shortness of breath, cough.  CHEST - 2 VIEW  Comparison: 08/24/2013  Findings: Mild hyperinflation.  Fibrosis in the lung bases appears stable since previous study.  No focal consolidation or airspace disease.  No blunting of costophrenic angles.  No pneumothorax. Normal heart size and pulmonary vascularity.  Mediastinal contours appear intact.  Degenerative changes in the spine.  Stable appearance since previous study.  IMPRESSION: Emphysematous changes in the chest with fibrosis in the lung bases. Stable appearance since previous study.  No evidence of active pulmonary disease.   Original Report Authenticated By: Burman Nieves, M.D.    Date: 08/26/2013  Rate: 73  Rhythm: normal sinus rhythm   QRS Axis: normal  Intervals: normal  ST/T Wave abnormalities: normal  Conduction Disutrbances:none  Narrative Interpretation: No ST or T wave changes consistent with ischemia.    Old EKG Reviewed: unchanged    MDM   1. COPD (chronic obstructive pulmonary disease)   2. Tobacco use disorder   3. COPD with acute exacerbation   4. COPD exacerbation    11:39 PM 62 y.o. male w/ hx of COPD on prednisone 20mg  q day, CAD s/p PCI who pw worsening sob x 1 month. Seen at [facility] 2 days ago and d/c home from ED.  Pt notes continued sob w/ chest tightness. Pt AFVSS here. Will get labs, breathing tx's, 60mg  solumedrol as pt reports full dose 3 days ago while in [facility] ED.   I discussed admission w/ Dr. Cena Benton (hospitalist). He came and evaluated the pt and believes the pt can be managed as an outpt. He provided a consult note w/ recommendations. This is reasonable as pt not hypoxic and reportedly did not get his Rx filled for the prednisone. He also hs O2 at home and a nebulizer.  3:48 AM: Pt feeling better. Will start on prednisone 60mg  q day. Will provide Rx for doxy as I am concerned pt will not get a more expensive anti-biotic. He is feeling much better and is happy with outpt treatment.  I have discussed the diagnosis/risks/treatment options with the patient and believe the pt to be eligible for discharge home to follow-up with pcp in the next 2-3 days. We also discussed returning to the ED immediately if new or worsening sx occur. We discussed the sx which are most concerning (e.g., worsening sob, cp, fever) that necessitate immediate return. Any new prescriptions provided to the patient are listed below.  Discharge Medication List as of 08/26/2013  3:50 AM    START taking these medications   Details  doxycycline (VIBRAMYCIN) 100 MG capsule Take 1 capsule (100 mg total) by mouth 2 (two) times daily. One po bid x 7 days, Starting 08/26/2013, Until Discontinued, Print    !! predniSONE (DELTASONE) 20 MG  tablet Take 3 tablets (60 mg total) by mouth daily., Starting 08/26/2013, Until Discontinued, Print     !! - Potential duplicate medications found. Please discuss with provider.       Junius Argyle, MD 08/26/13 1946

## 2013-08-26 DIAGNOSIS — J449 Chronic obstructive pulmonary disease, unspecified: Secondary | ICD-10-CM

## 2013-08-26 DIAGNOSIS — F17200 Nicotine dependence, unspecified, uncomplicated: Secondary | ICD-10-CM

## 2013-08-26 DIAGNOSIS — J441 Chronic obstructive pulmonary disease with (acute) exacerbation: Secondary | ICD-10-CM | POA: Diagnosis present

## 2013-08-26 MED ORDER — DOXYCYCLINE HYCLATE 100 MG PO CAPS: 100.0000 mg | ORAL_CAPSULE | Freq: Two times a day (BID) | ORAL | Status: DC

## 2013-08-26 MED ORDER — ALBUTEROL SULFATE (5 MG/ML) 0.5% IN NEBU
5.0000 mg | INHALATION_SOLUTION | Freq: Once | RESPIRATORY_TRACT | Status: AC
  Administered 2013-08-26: 5 mg via RESPIRATORY_TRACT
  Filled 2013-08-26: qty 1

## 2013-08-26 MED ORDER — IPRATROPIUM BROMIDE 0.02 % IN SOLN
0.5000 mg | Freq: Once | RESPIRATORY_TRACT | Status: AC
  Administered 2013-08-26: 0.5 mg via RESPIRATORY_TRACT
  Filled 2013-08-26: qty 2.5

## 2013-08-26 MED ORDER — PREDNISONE 20 MG PO TABS: 60.0000 mg | ORAL_TABLET | Freq: Every day | ORAL | Status: DC

## 2013-08-26 NOTE — Consult Note (Signed)
[facility] Medical Consultation  RIVAAN KENDALL GNF:[id number] DOB: 10/02/1951 DOA: 08/25/2013 PCP: No primary provider on file.   Requesting physician: Dr. Romeo Apple Date of consultation: 08/26/13 Reason for consultation: SOB and DOE  Impression/Recommendations Active Problems:   TOBACCO USER   COPD with acute exacerbation    1. Acute COPD exacerbation - Patient seen on last evaluation by ED physician on last Thursday. He reports that his prednisone dose was not increased and patient was prescribed an antibiotic but did not fill it or take it because of the expense.  As such patient did not fail outpatient treatment regimen for his COPD - currently patient breathing comfortably on supplemental oxygen. Which patient has at home. Currently VSS. - Recommend discussing with ED case manager to see which antibiotic will be most affordable (levaquin vs doxycycline) would favor levaquin as it is a once a day abx which patient may be more adherent and compliant with. - recommended tobacco cessation while he has his copd exacerbation and indefinitely if he can do so. - Recommend following up with Dr. Delton Coombes sooner which patient assures me he can do. - I would discharge him on prednisone 60 mg daily for the next 5 days then taper it back down to his home regimen of 20 mg daily.   - Otherwise patient is to continue his atrovent, pulmicort, and albuterol.  I would have him take his albuterol q 4 hour prn SOB or wheeze  2. Tobacco use - I have strongly recommended cessation which patient verbalizes understanding and agreement.   Chief Complaint: SOB, and DOE  HPI:  62 y/o with recent diagnosis of COPD exacerbation last seen in ED on 08/23/13 who reportedly did not take the recommended medications on discharge due to expense of medication.  Presents to the ED complaining of wheezing, increased cough, and SOB.  After initial treatment by ED physician patient feels much improved and he is comfortably  on supplemental oxygen which he uses at home with no tachypnea and oxygen saturation of 97 percent.    Review of Systems:  10 point review of systems reviewed and negative unless otherwise mentioned above.  Past Medical History  Diagnosis Date  . COPD (chronic obstructive pulmonary disease)   . CAD (coronary artery disease)   . Hypertension   . Hyperlipidemia   . Osteomyelitis    Past Surgical History  Procedure Laterality Date  . Appendectomy     Social History:  reports that he has been smoking Cigarettes.  He has a 12 pack-year smoking history. He has never used smokeless tobacco. He reports that he does not drink alcohol or use illicit drugs.  Allergies  Allergen Reactions  . Lipitor [Atorvastatin Calcium] Other (See Comments)    Muscle aches   Family History  Problem Relation Age of Onset  . Coronary artery disease    . Hypertension      Prior to Admission medications   Medication Sig Start Date End Date Taking? Authorizing Provider  albuterol (PROVENTIL) (2.5 MG/3ML) 0.083% nebulizer solution Take 3 mLs (2.5 mg total) by nebulization 4 (four) times daily. 06/28/13 06/28/14 Yes Leslye Peer, MD  aspirin 325 MG tablet Take 650 mg by mouth every morning.    Yes Historical Provider, MD  budesonide (PULMICORT) 0.25 MG/2ML nebulizer solution Take 2 mLs (0.25 mg total) by nebulization 2 (two) times daily. 06/28/13 06/28/14 Yes Leslye Peer, MD  clopidogrel (PLAVIX) 75 MG tablet Take 75 mg by mouth daily.  Yes Historical Provider, MD  doxycycline (VIBRA-TABS) 100 MG tablet Take 100 mg by mouth 2 (two) times daily.   Yes Historical Provider, MD  furosemide (LASIX) 20 MG tablet Take 20 mg by mouth every morning.   Yes Historical Provider, MD  ipratropium (ATROVENT) 0.02 % nebulizer solution Take 2.5 mLs (500 mcg total) by nebulization 4 (four) times daily. 06/28/13  Yes Leslye Peer, MD  metoprolol (LOPRESSOR) 50 MG tablet Take 50 mg by mouth 2 (two) times daily.    Yes  Historical Provider, MD  predniSONE (DELTASONE) 10 MG tablet Take 2 tablets (20 mg total) by mouth daily. 05/24/13  Yes Leslye Peer, MD  ranitidine (ZANTAC) 150 MG tablet Take 150 mg by mouth 2 (two) times daily as needed for heartburn.   Yes Historical Provider, MD  simvastatin (ZOCOR) 20 MG tablet Take 1 tablet (20 mg total) by mouth every evening. 07/31/12  Yes Leslye Peer, MD   Physical Exam: Blood pressure 130/70, pulse 65, temperature 97.4 F (36.3 C), temperature source Oral, resp. rate 17, SpO2 98.00%. Filed Vitals:   08/26/13 0105  BP:   Pulse:   Temp: 97.4 F (36.3 C)  Resp:      General:  Pt in NAD, Alert and Awake  Eyes: EOMI, non icteric  ENT: normal exterior appearance  Neck: supple, no goiter  Cardiovascular: RRR, no MRG  Respiratory: wheezes diffusely on expiration BL, no rhales  Abdomen: soft, NT, ND  Skin: warm and dry  Musculoskeletal: no cyanosis   Psychiatric: mood and affect appropriate  Neurologic: answers questions appropriately, moves all extremities.  Labs on Admission:  Basic Metabolic Panel:  Recent Labs Lab 08/25/13 2245  NA 143  K 3.8  CL 105  CO2 29  GLUCOSE 159*  BUN 24*  CREATININE 0.91  CALCIUM 8.8   Liver Function Tests:  Recent Labs Lab 08/25/13 2245  AST 15  ALT 19  ALKPHOS 48  BILITOT 0.1*  PROT 6.0  ALBUMIN 3.3*   No results found for this basename: LIPASE, AMYLASE,  in the last 168 hours No results found for this basename: AMMONIA,  in the last 168 hours CBC:  Recent Labs Lab 08/25/13 2245  WBC 10.6*  NEUTROABS 8.2*  HGB 13.7  HCT 41.3  MCV 88.4  PLT 178   Cardiac Enzymes:  Recent Labs Lab 08/25/13 2245  TROPONINI <0.30   BNP: No components found with this basename: POCBNP,  CBG: No results found for this basename: GLUCAP,  in the last 168 hours  Radiological Exams on Admission: Dg Chest 2 View  08/25/2013   *RADIOLOGY REPORT*  Clinical Data: Chest pain, shortness of breath, cough.   CHEST - 2 VIEW  Comparison: 08/24/2013  Findings: Mild hyperinflation.  Fibrosis in the lung bases appears stable since previous study.  No focal consolidation or airspace disease.  No blunting of costophrenic angles.  No pneumothorax. Normal heart size and pulmonary vascularity.  Mediastinal contours appear intact.  Degenerative changes in the spine.  Stable appearance since previous study.  IMPRESSION: Emphysematous changes in the chest with fibrosis in the lung bases. Stable appearance since previous study.  No evidence of active pulmonary disease.   Original Report Authenticated By: Burman Nieves, M.D.    EKG: Independently reviewed. NSR with no ST elevations or depressions  Time spent: > 45 minutes  Penny Pia [facility] Pager [phone]  If 7PM-7AM, please contact night-coverage [email or web address] Password [email or web address] 08/26/2013, 1:19 AM

## 2013-10-19 ENCOUNTER — Ambulatory Visit (INDEPENDENT_AMBULATORY_CARE_PROVIDER_SITE_OTHER): Payer: Medicare Other | Admitting: Emergency Medicine

## 2013-10-19 ENCOUNTER — Encounter: Payer: Self-pay | Admitting: Emergency Medicine

## 2013-10-19 VITALS — BP 120/74 | HR 81 | Ht 71.0 in | Wt 201.8 lb

## 2013-10-19 DIAGNOSIS — J449 Chronic obstructive pulmonary disease, unspecified: Secondary | ICD-10-CM

## 2013-10-19 MED ORDER — METOPROLOL TARTRATE 50 MG PO TABS: 50.0000 mg | ORAL_TABLET | Freq: Two times a day (BID) | ORAL | Status: DC

## 2013-10-19 NOTE — Assessment & Plan Note (Signed)
-   continue current regimen,  - needs to be seen more frequently to avoid trips to ED - would like to decrease pred but in no position to do this right now - discussed smoking cessation

## 2013-10-19 NOTE — Progress Notes (Signed)
Subjective:  Patient ID: [name], male    DOB: [birth year], 62 y.o.   MRN: [id number] HPI 62 yo smoker, hx CAD/stents, COPD, admitted 01/2011 for acute exacerbation in setting suspected viral pneumonitis. Current regimen is albuterol/atrovent nebs 2 -3 x a day, budesonide neds bid. He believes that his breathing has declined some since his oral steroids. His breathing is worst with bending over, with exertion.   ROV 07/31/12 -- COPD, continued smoking, frequent exacerbations. Having more nocturnal wheeze compared with last time. No exacerbations since last time. He has been on DuoNebs and Pulmicort Nebs, but he ran out of pulmicort about a month ago - can tell that he misses it. 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   10/26/12 Acute OV  Complains of  wheezing, chest tightness, prod cough w/ yellow phlem, and increased SOB x 1 week. Patient was called in a prednisone taper. 3 days ago. Does feel the symptoms are starting to get better, but still has a congested cough with thick mucus  He denies any hemoptysis, orthopnea, PND, or leg swelling  ROV 12/27/12 -- Hx tobacco (continued smoking, down to 5-6 cig a week), COPD, CAD. Treated as above for AE in 11/13, then again by phone on 11/24/12. He returns today for regular follow up. Regimen is DuoNebs qid + Pulmicort bid. 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 He improved on prednisone, but now that it has finished he is experiencing chest soreness from dyspnea, cough in the am - yellowish phlegm. Exertional SOB is worsening.    ROV 02/22/13 -- severe COPD, continued tobacco, CAD. He was admitted 2/17-2/28 for AE-COPD and resp failure. Returns feeling improved. Still smoking 1/2 pk a week. On DuoNebs  Qid + pulmicort nebs bid.  He is trying the e-cig, hasn't stopped. He has exertional SOB, some wheeze. He has O2 that he uses qhs, with some exertion.    As an aside today he mentions that he has been having some longstanding pain in his L testicle, ? Worse when he was exacerbated. He says no anatomical abnormality, nodule, etc.   ROV 05/24/13 -- continued tobacco, severe COPD, CAD, hypoxemia. Follow up visit.  Has been on chronic pred. We discussed increase to 20mg , but not done yet, still on 10mg . He quit smoking for a period of time, about 1 month. Then had to go back to about 1 cig that he stretches out for the whole day puff by puff. He wants to try new version of e-cig to see if he can stop again. He looked into [employer], hasn't been started because the company said they couldn't ship without insurance.   ROV 06/28/13 -- continued tobacco, severe COPD, CAD, hypoxemia. Last time we increased pred to 20mg  daily. He is wearing o2 at 2-3 L/min w exertion. Unable to get brovana. Tried the nicotine gum briefly, didn't see much effect. He has cut cigs way down.   ROV 10/18/13 -- severe COPD, CAD, hypoxemia. Current pred is 20mg . Continues to smoke about 1/2 pack a week. He has had some episodes of acute chest tightness, on one occasion had to go to [facility] and was treated for AE-COPD. He also tells me that he had TIA since last visit for which he did not seek care. He went to [facility] ED on 08/25/13 and was rx again for an AE. Taking duonebs qid +  budesonide bid.    CAT Score 02/22/2013 12/27/2012  Total CAT Score 26 29    Objective:   Filed Vitals:   10/19/13 1351  BP: 120/74  Pulse: 81  Height: 5\' 11"  (1.803 m)  Weight: 201 lb 12.8 oz (91.536 kg)  SpO2: 93%   Gen: Pleasant, in no distress,  normal affect  ENT: No lesions,  mouth clear,  oropharynx clear, no postnasal drip  Neck: No JVD, no TMG, no carotid bruits  Lungs: Few rhonchi w/o  use of accessory muscles, distant, no wheezing  Cardiovascular: RRR, heart sounds normal, no murmur or gallops, no peripheral edema  Musculoskeletal: No deformities, no cyanosis or clubbing  Neuro: alert, non  focal  Skin: Warm, no lesions or rashes   Assessment & Plan:  COPD (chronic obstructive pulmonary disease) - continue current regimen,  - needs to be seen more frequently to avoid trips to ED - would like to decrease pred but in no position to do this right now - discussed smoking cessation

## 2013-10-19 NOTE — Patient Instructions (Signed)
Please continue your albuterol/ipratropium nebs 4x a day Continue budesonide twice a day Continue your prednisone Follow with Dr Delton Coombes in 1 month or next available

## 2013-10-25 ENCOUNTER — Other Ambulatory Visit: Payer: Self-pay

## 2013-11-28 ENCOUNTER — Ambulatory Visit (INDEPENDENT_AMBULATORY_CARE_PROVIDER_SITE_OTHER): Payer: Medicare Other | Admitting: Emergency Medicine

## 2013-11-28 ENCOUNTER — Encounter: Payer: Self-pay | Admitting: Emergency Medicine

## 2013-11-28 VITALS — BP 130/82 | HR 80 | Ht 71.0 in | Wt 205.0 lb

## 2013-11-28 DIAGNOSIS — J449 Chronic obstructive pulmonary disease, unspecified: Secondary | ICD-10-CM

## 2013-11-28 NOTE — Assessment & Plan Note (Signed)
With early flare. Still smoking.  - continue current nebs - short burst increased pred - most important issue here is stopping smoking discussed in detail. We will revisit trying oral meds next visit.  - rov 2

## 2013-11-28 NOTE — Patient Instructions (Signed)
Please continue your nebulized medications as you have been taking them Wear your oxygen with all exertion Increase your prednisone to 40mg  x 4 days then go go back to 20mg  daily.  Continue to work on stopping smoking Follow with Dr Delton Coombes in 2 month

## 2013-11-28 NOTE — Progress Notes (Signed)
Subjective:  Patient ID: [name], male    DOB: 12/05/1951, 62 y.o.   MRN: [id number] HPI 62 yo smoker, hx CAD/stents, COPD, admitted 01/2011 for acute exacerbation in setting suspected viral pneumonitis. Current regimen is albuterol/atrovent nebs 2 -3 x a day, budesonide neds bid. He believes that his breathing has declined some since his oral steroids. His breathing is worst with bending over, with exertion.   ROV 07/31/12 -- COPD, continued smoking, frequent exacerbations. Having more nocturnal wheeze compared with last time. No exacerbations since last time. He has been on DuoNebs and Pulmicort Nebs, but he ran out of pulmicort about a month ago - can tell that he misses it. 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   10/26/12 Acute OV  Complains of  wheezing, chest tightness, prod cough w/ yellow phlem, and increased SOB x 1 week. Patient was called in a prednisone taper. 3 days ago. Does feel the symptoms are starting to get better, but still has a congested cough with thick mucus  He denies any hemoptysis, orthopnea, PND, or leg swelling  ROV 12/27/12 -- Hx tobacco (continued smoking, down to 5-6 cig a week), COPD, CAD. Treated as above for AE in 11/13, then again by phone on 11/24/12. He returns today for regular follow up. Regimen is DuoNebs qid + Pulmicort bid. 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 He improved on prednisone, but now that it has finished he is experiencing chest soreness from dyspnea, cough in the am - yellowish phlegm. Exertional SOB is worsening.    ROV 02/22/13 -- severe COPD, continued tobacco, CAD. He was admitted 2/17-2/28 for AE-COPD and resp failure. Returns feeling improved. Still smoking 1/2 pk a week. On DuoNebs  Qid + pulmicort nebs bid.  He is trying the e-cig, hasn't stopped. He has exertional SOB, some wheeze. He has O2 that he uses qhs, with some exertion.    As an aside today he mentions that he has been having some longstanding pain in his L testicle, ? Worse when he was exacerbated. He says no anatomical abnormality, nodule, etc.   ROV 05/24/13 -- continued tobacco, severe COPD, CAD, hypoxemia. Follow up visit.  Has been on chronic pred. We discussed increase to 20mg , but not done yet, still on 10mg . He quit smoking for a period of time, about 1 month. Then had to go back to about 1 cig that he stretches out for the whole day puff by puff. He wants to try new version of e-cig to see if he can stop again. He looked into [employer], hasn't been started because the company said they couldn't ship without insurance.   ROV 06/28/13 -- continued tobacco, severe COPD, CAD, hypoxemia. Last time we increased pred to 20mg  daily. He is wearing o2 at 2-3 L/min w exertion. Unable to get brovana. Tried the nicotine gum briefly, didn't see much effect. He has cut cigs way down.   ROV 10/18/13 -- severe COPD, CAD, hypoxemia. Current pred is 20mg . Continues to smoke about 1/2 pack a week. He has had some episodes of acute chest tightness, on one occasion had to go [facility] and was treated for AE-COPD. He also tells me that he had TIA since last visit for which he did not seek care. [facility] ED [facility] 08/25/13 and was rx again for an AE. Taking duonebs qid +  budesonide bid.   ROV 11/28/13 -- severe COPD, CAD, hypoxemia. Current pred is 20mg . Continues to smoke. Last AE was 9/'14. For last 2 days has had some burning when he breaths in/out, helped a bit by BD's. Has minimal cough. Continues to smoke "a little".  Some wheezing. He has oxygen at home, occasionally uses.    CAT Score 02/22/2013 12/27/2012  Total CAT Score 26 29    Objective:   Filed Vitals:   11/28/13 1040  BP: 130/82  Pulse: 80  Height: 5\' 11"  (1.803 m)  Weight: 205 lb (92.987 kg)  SpO2: 91%   Gen: Pleasant, in no distress,  normal affect  ENT: No lesions,  mouth clear,  oropharynx clear, no  postnasal drip  Neck: No JVD, no TMG, no carotid bruits  Lungs: Few rhonchi w/o  use of accessory muscles, distant, no wheezing  Cardiovascular: RRR, heart sounds normal, no murmur or gallops, no peripheral edema  Musculoskeletal: No deformities, no cyanosis or clubbing  Neuro: alert, non focal  Skin: Warm, no lesions or rashes   Assessment & Plan:  COPD (chronic obstructive pulmonary disease) With early flare. Still smoking.  - continue current nebs - short burst increased pred - most important issue here is stopping smoking discussed in detail. We will revisit trying oral meds next visit.  - rov 2

## 2013-11-29 ENCOUNTER — Telehealth: Payer: Self-pay | Admitting: Emergency Medicine

## 2013-11-29 NOTE — Telephone Encounter (Signed)
Pt states that an antibiotic was to be sent in for him yesterday. Looking back at the pt's AVS instructions, there isn't anything on there about an antibiotic. According to the pt, RB and him discussed this.  RB - please advise.

## 2013-11-29 NOTE — Telephone Encounter (Signed)
He was supposed to increase his prednisone to 40mg  x 4 days and then 30mg  x 4 days and then back to 20mg  daily. I wasn't going to give an antibiotic.

## 2013-11-29 NOTE — Telephone Encounter (Signed)
Called, spoke with pt.  Informed him of below per RB.  He verbalized understanding of this and voiced no further questions or concerns at this time.

## 2013-12-15 ENCOUNTER — Other Ambulatory Visit: Payer: Self-pay

## 2013-12-15 ENCOUNTER — Encounter (HOSPITAL_COMMUNITY): Payer: Self-pay | Admitting: Emergency Medicine

## 2013-12-15 ENCOUNTER — Inpatient Hospital Stay (HOSPITAL_COMMUNITY)
Admission: EM | Admit: 2013-12-15 | Discharge: 2013-12-25 | DRG: 190 | Disposition: A | Discharge status: Home / Self Care | Payer: Medicare Other | Attending: Internal Medicine | Admitting: Internal Medicine

## 2013-12-15 ENCOUNTER — Emergency Department (HOSPITAL_COMMUNITY): Payer: Medicare Other

## 2013-12-15 DIAGNOSIS — E785 Hyperlipidemia, unspecified: Secondary | ICD-10-CM | POA: Diagnosis present

## 2013-12-15 DIAGNOSIS — Z9861 Coronary angioplasty status: Secondary | ICD-10-CM

## 2013-12-15 DIAGNOSIS — Z7902 Long term (current) use of antithrombotics/antiplatelets: Secondary | ICD-10-CM

## 2013-12-15 DIAGNOSIS — J441 Chronic obstructive pulmonary disease with (acute) exacerbation: Principal | ICD-10-CM | POA: Diagnosis present

## 2013-12-15 DIAGNOSIS — Z7982 Long term (current) use of aspirin: Secondary | ICD-10-CM

## 2013-12-15 DIAGNOSIS — J1100 Influenza due to unidentified influenza virus with unspecified type of pneumonia: Secondary | ICD-10-CM | POA: Diagnosis present

## 2013-12-15 DIAGNOSIS — J449 Chronic obstructive pulmonary disease, unspecified: Secondary | ICD-10-CM

## 2013-12-15 DIAGNOSIS — IMO0002 Reserved for concepts with insufficient information to code with codable children: Secondary | ICD-10-CM

## 2013-12-15 DIAGNOSIS — R079 Chest pain, unspecified: Secondary | ICD-10-CM

## 2013-12-15 DIAGNOSIS — R0603 Acute respiratory distress: Secondary | ICD-10-CM

## 2013-12-15 DIAGNOSIS — I2510 Atherosclerotic heart disease of native coronary artery without angina pectoris: Secondary | ICD-10-CM | POA: Diagnosis present

## 2013-12-15 DIAGNOSIS — J189 Pneumonia, unspecified organism: Secondary | ICD-10-CM

## 2013-12-15 DIAGNOSIS — F17200 Nicotine dependence, unspecified, uncomplicated: Secondary | ICD-10-CM | POA: Diagnosis present

## 2013-12-15 DIAGNOSIS — Z23 Encounter for immunization: Secondary | ICD-10-CM

## 2013-12-15 DIAGNOSIS — J101 Influenza due to other identified influenza virus with other respiratory manifestations: Secondary | ICD-10-CM | POA: Diagnosis present

## 2013-12-15 DIAGNOSIS — J111 Influenza due to unidentified influenza virus with other respiratory manifestations: Secondary | ICD-10-CM

## 2013-12-15 DIAGNOSIS — Z79899 Other long term (current) drug therapy: Secondary | ICD-10-CM

## 2013-12-15 DIAGNOSIS — I10 Essential (primary) hypertension: Secondary | ICD-10-CM | POA: Diagnosis present

## 2013-12-15 DIAGNOSIS — R509 Fever, unspecified: Secondary | ICD-10-CM | POA: Diagnosis present

## 2013-12-15 DIAGNOSIS — J9601 Acute respiratory failure with hypoxia: Secondary | ICD-10-CM

## 2013-12-15 DIAGNOSIS — M869 Osteomyelitis, unspecified: Secondary | ICD-10-CM | POA: Diagnosis present

## 2013-12-15 LAB — CBC
HCT: 42.9 % (ref 39.0–52.0)
Hemoglobin: 14.3 g/dL (ref 13.0–17.0)
MCH: 29.3 pg (ref 26.0–34.0)
MCHC: 33.3 g/dL (ref 30.0–36.0)
MCV: 87.9 fL (ref 78.0–100.0)
Platelets: 128 K/uL — ABNORMAL LOW (ref 150–400)
RBC: 4.88 MIL/uL (ref 4.22–5.81)
RDW: 14 % (ref 11.5–15.5)
WBC: 6.7 K/uL (ref 4.0–10.5)

## 2013-12-15 LAB — URINALYSIS, ROUTINE W REFLEX MICROSCOPIC
Bilirubin Urine: NEGATIVE
Ketones, ur: NEGATIVE mg/dL
Leukocytes, UA: NEGATIVE
Nitrite: NEGATIVE
Protein, ur: NEGATIVE mg/dL
Urobilinogen, UA: 0.2 mg/dL (ref 0.0–1.0)
pH: 5.5 (ref 5.0–8.0)

## 2013-12-15 LAB — POCT I-STAT TROPONIN I: Troponin i, poc: 0 ng/mL (ref 0.00–0.08)

## 2013-12-15 LAB — BASIC METABOLIC PANEL WITH GFR
BUN: 14 mg/dL (ref 6–23)
CO2: 31 meq/L (ref 19–32)
Calcium: 9 mg/dL (ref 8.4–10.5)
Chloride: 102 meq/L (ref 96–112)
Creatinine, Ser: 1.05 mg/dL (ref 0.50–1.35)
GFR calc Af Amer: 85 mL/min — ABNORMAL LOW
GFR calc non Af Amer: 73 mL/min — ABNORMAL LOW
Glucose, Bld: 104 mg/dL — ABNORMAL HIGH (ref 70–99)
Potassium: 4.1 meq/L (ref 3.5–5.1)
Sodium: 140 meq/L (ref 135–145)

## 2013-12-15 LAB — LACTIC ACID, PLASMA: Lactic Acid, Venous: 1.8 mmol/L (ref 0.5–2.2)

## 2013-12-15 LAB — TROPONIN I: Troponin I: <0.3 ng/mL (ref <0.30)

## 2013-12-15 LAB — PRO B NATRIURETIC PEPTIDE: Pro B Natriuretic peptide (BNP): 78.6 pg/mL (ref 0–125)

## 2013-12-15 MED ORDER — DEXTROSE 5 % IV SOLN
1.0000 g | Freq: Once | INTRAVENOUS | Status: AC
  Administered 2013-12-15: 1 g via INTRAVENOUS
  Filled 2013-12-15: qty 10

## 2013-12-15 MED ORDER — DEXTROSE 5 % IV SOLN
500.0000 mg | Freq: Once | INTRAVENOUS | Status: AC
  Administered 2013-12-16: 500 mg via INTRAVENOUS

## 2013-12-15 MED ORDER — IPRATROPIUM BROMIDE 0.02 % IN SOLN
1.0000 mg | Freq: Once | RESPIRATORY_TRACT | Status: AC
  Administered 2013-12-15: 1 mg via RESPIRATORY_TRACT
  Filled 2013-12-15: qty 5

## 2013-12-15 MED ORDER — ACETAMINOPHEN 325 MG PO TABS
650.0000 mg | ORAL_TABLET | Freq: Once | ORAL | Status: AC
  Administered 2013-12-15: 650 mg via ORAL
  Filled 2013-12-15: qty 2

## 2013-12-15 MED ORDER — ALBUTEROL (5 MG/ML) CONTINUOUS INHALATION SOLN
20.0000 mg/h | INHALATION_SOLUTION | RESPIRATORY_TRACT | Status: DC
  Administered 2013-12-15: 20 mg/h via RESPIRATORY_TRACT
  Filled 2013-12-15: qty 20

## 2013-12-15 MED ORDER — SODIUM CHLORIDE 0.9 % IV BOLUS (SEPSIS)
1000.0000 mL | Freq: Once | INTRAVENOUS | Status: AC
  Administered 2013-12-15: 1000 mL via INTRAVENOUS

## 2013-12-15 NOTE — ED Notes (Signed)
Bed: RESB Expected date:  Expected time:  Means of arrival:  Comments: EMS-respiratory distress 

## 2013-12-15 NOTE — ED Notes (Signed)
Per EMS: Pt developed shortness of breath two hours ago. Pt used his home inhalers twice without relief. Pt has a history of COPD and CHF. EMS reports wheezing in all fields. Pt was given 10 mg of albuterol, 1 mg of Atrovent, 125 mg of solumedrol, and 2 grams of magnesium sulfate. EMS reports this decreased his respiratory rate from the 40's to 20's and his wheezing has improved. Pt reports intermittent chest pain prior to EMS arrival, however it has resolved after treatment. Pt is A/O x4.

## 2013-12-16 ENCOUNTER — Encounter (HOSPITAL_COMMUNITY): Payer: Self-pay | Admitting: Internal Medicine

## 2013-12-16 DIAGNOSIS — R0602 Shortness of breath: Secondary | ICD-10-CM

## 2013-12-16 DIAGNOSIS — I2510 Atherosclerotic heart disease of native coronary artery without angina pectoris: Secondary | ICD-10-CM

## 2013-12-16 DIAGNOSIS — R079 Chest pain, unspecified: Secondary | ICD-10-CM

## 2013-12-16 DIAGNOSIS — R509 Fever, unspecified: Secondary | ICD-10-CM | POA: Diagnosis present

## 2013-12-16 LAB — COMPREHENSIVE METABOLIC PANEL
AST: 16 U/L (ref 0–37)
BUN: 14 mg/dL (ref 6–23)
CO2: 24 mEq/L (ref 19–32)
Calcium: 8.3 mg/dL — ABNORMAL LOW (ref 8.4–10.5)
Creatinine, Ser: 0.87 mg/dL (ref 0.50–1.35)
GFR calc Af Amer: >90 mL/min (ref >90)
GFR calc non Af Amer: 89 mL/min — ABNORMAL LOW (ref >90)
Glucose, Bld: 251 mg/dL — ABNORMAL HIGH (ref 70–99)
Potassium: 4.2 mEq/L (ref 3.5–5.1)

## 2013-12-16 LAB — CBC WITH DIFFERENTIAL/PLATELET
Basophils Relative: 0 % (ref 0–1)
Hemoglobin: 13.6 g/dL (ref 13.0–17.0)
Lymphocytes Relative: 4 % — ABNORMAL LOW (ref 12–46)
Lymphs Abs: 0.3 10*3/uL — ABNORMAL LOW (ref 0.7–4.0)
MCV: 89.6 fL (ref 78.0–100.0)
Monocytes Relative: 2 % — ABNORMAL LOW (ref 3–12)
Neutro Abs: 6.3 10*3/uL (ref 1.7–7.7)
Neutrophils Relative %: 94 % — ABNORMAL HIGH (ref 43–77)
RBC: 4.6 MIL/uL (ref 4.22–5.81)
WBC: 6.8 10*3/uL (ref 4.0–10.5)

## 2013-12-16 LAB — GLUCOSE, CAPILLARY
Glucose-Capillary: 236 mg/dL — ABNORMAL HIGH (ref 70–99)
Glucose-Capillary: 131 mg/dL — ABNORMAL HIGH (ref 70–99)
Glucose-Capillary: 188 mg/dL — ABNORMAL HIGH (ref 70–99)

## 2013-12-16 LAB — HEPARIN LEVEL (UNFRACTIONATED): Heparin Unfractionated: 0.39 IU/mL (ref 0.30–0.70)

## 2013-12-16 LAB — TROPONIN I: Troponin I: <0.3 ng/mL (ref <0.30)

## 2013-12-16 LAB — MRSA PCR SCREENING: MRSA by PCR: NEGATIVE

## 2013-12-16 MED ORDER — ALBUTEROL SULFATE (5 MG/ML) 0.5% IN NEBU
2.5000 mg | INHALATION_SOLUTION | RESPIRATORY_TRACT | Status: DC
  Administered 2013-12-16: 2.5 mg via RESPIRATORY_TRACT
  Filled 2013-12-16: qty 0.5

## 2013-12-16 MED ORDER — ASPIRIN EC 325 MG PO TBEC
325.0000 mg | DELAYED_RELEASE_TABLET | Freq: Every day | ORAL | Status: DC
  Administered 2013-12-16 – 2013-12-25 (×10): 325 mg via ORAL
  Filled 2013-12-16 (×10): qty 1

## 2013-12-16 MED ORDER — DEXTROSE 5 % IV SOLN
500.0000 mg | INTRAVENOUS | Status: DC
  Administered 2013-12-16 – 2013-12-18 (×3): 500 mg via INTRAVENOUS
  Filled 2013-12-16 (×3): qty 500

## 2013-12-16 MED ORDER — ONDANSETRON HCL 4 MG PO TABS: 4.0000 mg | ORAL_TABLET | Freq: Four times a day (QID) | ORAL | Status: DC | PRN

## 2013-12-16 MED ORDER — DEXTROSE 5 % IV SOLN
1.0000 g | INTRAVENOUS | Status: DC
  Administered 2013-12-16 – 2013-12-17 (×2): 1 g via INTRAVENOUS
  Filled 2013-12-16 (×4): qty 10

## 2013-12-16 MED ORDER — ALBUTEROL SULFATE (2.5 MG/3ML) 0.083% IN NEBU
2.5000 mg | INHALATION_SOLUTION | RESPIRATORY_TRACT | Status: DC | PRN
  Administered 2013-12-16 – 2013-12-18 (×8): 2.5 mg via RESPIRATORY_TRACT
  Filled 2013-12-16: qty 0.5
  Filled 2013-12-16: qty 3
  Filled 2013-12-16 (×4): qty 0.5

## 2013-12-16 MED ORDER — ACETAMINOPHEN 650 MG RE SUPP: 650.0000 mg | Freq: Four times a day (QID) | RECTAL | Status: DC | PRN

## 2013-12-16 MED ORDER — INFLUENZA VAC SPLIT QUAD 0.5 ML IM SUSP
0.5000 mL | INTRAMUSCULAR | Status: AC
  Administered 2013-12-17: 0.5 mL via INTRAMUSCULAR
  Filled 2013-12-16: qty 0.5

## 2013-12-16 MED ORDER — IPRATROPIUM BROMIDE 0.02 % IN SOLN
0.5000 mg | Freq: Four times a day (QID) | RESPIRATORY_TRACT | Status: DC
  Administered 2013-12-16 – 2013-12-18 (×8): 0.5 mg via RESPIRATORY_TRACT
  Filled 2013-12-16 (×12): qty 2.5

## 2013-12-16 MED ORDER — PANTOPRAZOLE SODIUM 40 MG PO TBEC
40.0000 mg | DELAYED_RELEASE_TABLET | Freq: Every day | ORAL | Status: DC
  Administered 2013-12-17 – 2013-12-25 (×9): 40 mg via ORAL
  Filled 2013-12-16 (×9): qty 1

## 2013-12-16 MED ORDER — SODIUM CHLORIDE 0.9 % IJ SOLN: 3.0000 mL | Freq: Two times a day (BID) | INTRAMUSCULAR | Status: DC

## 2013-12-16 MED ORDER — FUROSEMIDE 20 MG PO TABS
20.0000 mg | ORAL_TABLET | Freq: Every morning | ORAL | Status: DC
  Administered 2013-12-16 – 2013-12-25 (×10): 20 mg via ORAL
  Filled 2013-12-16 (×10): qty 1

## 2013-12-16 MED ORDER — IPRATROPIUM BROMIDE 0.02 % IN SOLN
0.5000 mg | Freq: Four times a day (QID) | RESPIRATORY_TRACT | Status: DC
  Administered 2013-12-16 (×3): 0.5 mg via RESPIRATORY_TRACT
  Filled 2013-12-16 (×2): qty 2.5

## 2013-12-16 MED ORDER — IPRATROPIUM BROMIDE 0.02 % IN SOLN
0.5000 mg | RESPIRATORY_TRACT | Status: DC
  Administered 2013-12-16: 0.5 mg via RESPIRATORY_TRACT
  Filled 2013-12-16: qty 2.5

## 2013-12-16 MED ORDER — HEPARIN (PORCINE) IN NACL 100-0.45 UNIT/ML-% IJ SOLN
1000.0000 [IU]/h | INTRAMUSCULAR | Status: DC
  Administered 2013-12-16: 1000 [IU]/h via INTRAVENOUS
  Filled 2013-12-16 (×2): qty 250

## 2013-12-16 MED ORDER — METHYLPREDNISOLONE SODIUM SUCC 40 MG IJ SOLR
40.0000 mg | Freq: Two times a day (BID) | INTRAMUSCULAR | Status: DC
  Administered 2013-12-16 – 2013-12-18 (×5): 40 mg via INTRAVENOUS
  Filled 2013-12-16 (×7): qty 1

## 2013-12-16 MED ORDER — HEPARIN BOLUS VIA INFUSION
4000.0000 [IU] | Freq: Once | INTRAVENOUS | Status: AC
  Administered 2013-12-16: 4000 [IU] via INTRAVENOUS
  Filled 2013-12-16: qty 4000

## 2013-12-16 MED ORDER — MORPHINE SULFATE 2 MG/ML IJ SOLN
1.0000 mg | INTRAMUSCULAR | Status: DC | PRN
  Administered 2013-12-16 (×2): 1 mg via INTRAVENOUS
  Filled 2013-12-16 (×3): qty 1

## 2013-12-16 MED ORDER — ACETAMINOPHEN 325 MG PO TABS
650.0000 mg | ORAL_TABLET | Freq: Four times a day (QID) | ORAL | Status: DC | PRN
Start: 2013-12-16 — End: 2013-12-25
  Administered 2013-12-17 – 2013-12-18 (×4): 650 mg via ORAL
  Filled 2013-12-16 (×4): qty 2

## 2013-12-16 MED ORDER — ASPIRIN 325 MG PO TABS: 650.0000 mg | ORAL_TABLET | Freq: Every morning | ORAL | Status: DC

## 2013-12-16 MED ORDER — CLOPIDOGREL BISULFATE 75 MG PO TABS
75.0000 mg | ORAL_TABLET | Freq: Every day | ORAL | Status: DC
  Administered 2013-12-16 – 2013-12-25 (×10): 75 mg via ORAL
  Filled 2013-12-16 (×10): qty 1

## 2013-12-16 MED ORDER — ALBUTEROL SULFATE (5 MG/ML) 0.5% IN NEBU
2.5000 mg | INHALATION_SOLUTION | Freq: Four times a day (QID) | RESPIRATORY_TRACT | Status: DC
  Administered 2013-12-16 (×3): 2.5 mg via RESPIRATORY_TRACT
  Filled 2013-12-16 (×2): qty 0.5

## 2013-12-16 MED ORDER — ENOXAPARIN SODIUM 40 MG/0.4ML ~~LOC~~ SOLN
40.0000 mg | SUBCUTANEOUS | Status: DC
  Administered 2013-12-16 – 2013-12-24 (×9): 40 mg via SUBCUTANEOUS
  Filled 2013-12-16 (×10): qty 0.4

## 2013-12-16 MED ORDER — ALBUTEROL SULFATE (2.5 MG/3ML) 0.083% IN NEBU
2.5000 mg | INHALATION_SOLUTION | Freq: Four times a day (QID) | RESPIRATORY_TRACT | Status: DC
  Administered 2013-12-16 – 2013-12-18 (×6): 2.5 mg via RESPIRATORY_TRACT
  Filled 2013-12-16: qty 0.5
  Filled 2013-12-16 (×5): qty 3
  Filled 2013-12-16 (×2): qty 0.5

## 2013-12-16 MED ORDER — BUDESONIDE 0.25 MG/2ML IN SUSP
0.2500 mg | Freq: Two times a day (BID) | RESPIRATORY_TRACT | Status: DC
  Administered 2013-12-16 – 2013-12-25 (×17): 0.25 mg via RESPIRATORY_TRACT
  Filled 2013-12-16 (×26): qty 2

## 2013-12-16 MED ORDER — METOPROLOL TARTRATE 50 MG PO TABS
50.0000 mg | ORAL_TABLET | Freq: Two times a day (BID) | ORAL | Status: DC
  Administered 2013-12-16 – 2013-12-25 (×19): 50 mg via ORAL
  Filled 2013-12-16 (×20): qty 1

## 2013-12-16 MED ORDER — IPRATROPIUM BROMIDE 0.02 % IN SOLN
RESPIRATORY_TRACT | Status: AC
  Filled 2013-12-16: qty 2.5

## 2013-12-16 MED ORDER — ONDANSETRON HCL 4 MG/2ML IJ SOLN: 4.0000 mg | Freq: Four times a day (QID) | INTRAMUSCULAR | Status: DC | PRN

## 2013-12-16 MED ORDER — INSULIN ASPART 100 UNIT/ML ~~LOC~~ SOLN
0.0000 [IU] | Freq: Three times a day (TID) | SUBCUTANEOUS | Status: DC
  Administered 2013-12-16 – 2013-12-18 (×4): 1 [IU] via SUBCUTANEOUS
  Administered 2013-12-18: 2 [IU] via SUBCUTANEOUS
  Administered 2013-12-19 – 2013-12-20 (×2): 1 [IU] via SUBCUTANEOUS
  Administered 2013-12-20 – 2013-12-21 (×2): 2 [IU] via SUBCUTANEOUS
  Administered 2013-12-21 – 2013-12-22 (×2): 1 [IU] via SUBCUTANEOUS
  Administered 2013-12-22: 2 [IU] via SUBCUTANEOUS
  Administered 2013-12-22: 1 [IU] via SUBCUTANEOUS
  Administered 2013-12-23 (×2): 2 [IU] via SUBCUTANEOUS
  Administered 2013-12-24: 1 [IU] via SUBCUTANEOUS
  Administered 2013-12-24: 5 [IU] via SUBCUTANEOUS
  Administered 2013-12-24: 2 [IU] via SUBCUTANEOUS

## 2013-12-16 MED ORDER — NITROGLYCERIN 0.4 MG/HR TD PT24
0.4000 mg | MEDICATED_PATCH | Freq: Every day | TRANSDERMAL | Status: DC
  Administered 2013-12-16: 0.4 mg via TRANSDERMAL
  Filled 2013-12-16: qty 1

## 2013-12-16 MED ORDER — ALBUTEROL SULFATE (5 MG/ML) 0.5% IN NEBU
INHALATION_SOLUTION | RESPIRATORY_TRACT | Status: AC
  Filled 2013-12-16: qty 0.5

## 2013-12-16 MED ORDER — PANTOPRAZOLE SODIUM 40 MG IV SOLR
40.0000 mg | INTRAVENOUS | Status: DC
  Administered 2013-12-16: 40 mg via INTRAVENOUS
  Filled 2013-12-16: qty 40

## 2013-12-16 NOTE — H&P (Signed)
[facility] History and Physical  [name] JYN:[id number] DOB: February 13, 1951 DOA: 12/15/2013  Referring physician: ER physician. PCP: No PCP Per Patient Dr. Delton Coombes.  Chief Complaint: Shortness of breath and chest pain.  HPI: [name] is a 62 y.o. male with history of COPD on home oxygen and chronic steroid therapy, CAD status post stenting presented to the ER because of chest pain and short of breath. Patient states that he's been having chronic shortness of breath but 3 weeks ago it has worsened and had gone to his PCP Dr. Delton Coombes who had increased his steroid dose and at this time is being tapered to his home dose. Despite which patient has been getting increasingly short of breath over the last few days with productive cough subjective feeling of fever and chills. Over the last few days patient has been getting chest pain off and on anterior chest nonradiating pressure-like. Patient states that last evening he became more severe than before. In the ER EKG were showing ST-T changes concerning for ST elevation in inferior leads. On-call cardiologist was consulted by the ER physician. As per the cardiologist the ST-T changes are nonspecific but wants [facility] to [facility] and have cardiologist on call see patient there. Patient otherwise denies any nausea vomiting abdominal pain. Chest x-ray does not show anything acute. On exam patient is wheezing bilaterally and is febrile. Blood cultures have been sent. Influenza PCR is pending and patient has been started on empiric antibiotics for possible pneumonia. Patient has been placed on nebulizer and heparin infusion.   Review of Systems: As presented in the history of presenting illness, rest negative.  Past Medical History  Diagnosis Date  . COPD (chronic obstructive pulmonary disease)   . CAD (coronary artery disease)   . Hypertension   . Hyperlipidemia   . Osteomyelitis    Past Surgical History  Procedure Laterality  Date  . Appendectomy     Social History:  reports that he has been smoking Cigarettes.  He has a 12 pack-year smoking history. He has never used smokeless tobacco. He reports that he does not drink alcohol or use illicit drugs. Where does patient live home. Can patient participate in ADLs? Yes.  Allergies  Allergen Reactions  . Lipitor [Atorvastatin Calcium] Other (See Comments)    Muscle aches    Family History:  Family History  Problem Relation Age of Onset  . Coronary artery disease    . Hypertension        Prior to Admission medications   Medication Sig Start Date End Date Taking? Authorizing Provider  albuterol (PROVENTIL) (2.5 MG/3ML) 0.083% nebulizer solution Take 3 mLs (2.5 mg total) by nebulization 4 (four) times daily. 06/28/13 06/28/14 Yes Leslye Peer, MD  aspirin 325 MG tablet Take 650 mg by mouth every morning.    Yes Historical Provider, MD  aspirin-sod bicarb-citric acid (ALKA-SELTZER) 325 MG TBEF tablet Take 325 mg by mouth every 6 (six) hours as needed (for heartburn).   Yes Historical Provider, MD  budesonide (PULMICORT) 0.25 MG/2ML nebulizer solution Take 2 mLs (0.25 mg total) by nebulization 2 (two) times daily. 06/28/13 06/28/14 Yes Leslye Peer, MD  clopidogrel (PLAVIX) 75 MG tablet Take 75 mg by mouth daily.     Yes Historical Provider, MD  furosemide (LASIX) 20 MG tablet Take 20 mg by mouth every morning.   Yes Historical Provider, MD  metoprolol (LOPRESSOR) 50 MG tablet Take 1 tablet (50 mg total) by mouth 2 (two)  times daily. 10/19/13  Yes Leslye Peer, MD  predniSONE (DELTASONE) 20 MG tablet Take 20 mg by mouth daily. 08/26/13  Yes Junius Argyle, MD    Physical Exam: Filed Vitals:   12/15/13 2238 12/15/13 2250 12/15/13 2254 12/15/13 2300  BP:   139/88 108/76  Pulse:   100 100  Temp:  102.1 F (38.9 C)    TempSrc:  Rectal    Resp:   27 27  SpO2: 95%  98% 99%     General:  Well-developed and nourished.  Eyes: Anicteric no pallor.  ENT:  No discharge from the ears eyes nose mouth.   Neck:  No JVD appreciated. No mass felt.  Cardiovascular:  S1-S2 heard.  Respiratory:  Bilateral expiratory wheezes heard no crepitations.  Abdomen:  Soft nontender bowel sounds present.  Skin:  No rash.  Musculoskeletal:  No edema.  Psychiatric:  Appears normal.  Neurologic:  Alert awake oriented to time place and person. Moves all extremities.  Labs on Admission:  Basic Metabolic Panel:  Recent Labs Lab 12/15/13 2200  NA 140  K 4.1  CL 102  CO2 31  GLUCOSE 104*  BUN 14  CREATININE 1.05  CALCIUM 9.0   Liver Function Tests: No results found for this basename: AST, ALT, ALKPHOS, BILITOT, PROT, ALBUMIN,  in the last 168 hours No results found for this basename: LIPASE, AMYLASE,  in the last 168 hours No results found for this basename: AMMONIA,  in the last 168 hours CBC:  Recent Labs Lab 12/15/13 2200  WBC 6.7  HGB 14.3  HCT 42.9  MCV 87.9  PLT 128*   Cardiac Enzymes:  Recent Labs Lab 12/15/13 2200  TROPONINI <0.30    BNP (last 3 results)  Recent Labs  02/07/13 1025 12/15/13 2200  PROBNP 192.9* 78.6   CBG: No results found for this basename: GLUCAP,  in the last 168 hours  Radiological Exams on Admission: Dg Chest 2 View  12/15/2013   CLINICAL DATA:  Shortness of breath, chest pain, COPD  EXAM: CHEST  2 VIEW  COMPARISON:  08/25/2013  FINDINGS: Hyperinflation noted compatible with background COPD/ emphysema. No focal pneumonia, edema, effusion or pneumothorax. Trachea midline. Normal heart size and vascularity.  IMPRESSION: Chronic COPD/ emphysema.  No superimposed acute process   Electronically Signed   By: Ruel Favors M.D.   On: 12/15/2013 23:01    EKG: Independently reviewed. normal sinus rhythm with ST-T changes in inferior leads.  Assessment/Plan Active Problems:   CAD   Chest pain   COPD with acute exacerbation   COPD exacerbation   Fever   1. COPD exacerbation - I have placed  patient on Solu-Medrol nebulizer and Pulmicort. Since patient has fever blood cultures have been sent along with influenza PCR. Patient has been placed on ceftriaxone and Zithromax for now. 2. Fever - see #1. 3. Chest pain with history of CAD status post stenting - patient's EKG was concerning for ST elevation initially but as per cardiologist there are nonspecific. Cardiologist has recommended patient to be transferred [facility]. At this time patient has been started on IV heparin infusion. Continue with the metoprolol. Placed on nitroglycerin patch. Continue aspirin and Plavix. 4. Tobacco abuse - strongly advised to quit smoking.  I have informed Dr. Houston Siren about the patient's transfer to [facility]. Patient is agreeable to transfer to [facility].  Code Status:  Full code.  Family Communication:  None.  Disposition Plan:  Admit to inpatient.  Krystel Fletchall N. [facility] Pager [phone].  If 7PM-7AM, please contact night-coverage [email or web address] Password [email or web address] 12/16/2013, 1:47 AM

## 2013-12-16 NOTE — Progress Notes (Signed)
[facility] Progress Note [place] TEAM 1 - Stepdown/ICU TEAM   HARIM BI ZOX:[id number] DOB: 02-06-1951 DOA: 12/15/2013 PCP: No PCP Per Patient  Admit HPI / Brief Narrative: 62 y.o. male with history of COPD on home oxygen and chronic steroid therapy, CAD status post stenting presented to the ER because of chest pain and short of breath. Patient stated that he'd been having chronic shortness of breath but it had worsened.  The patient had also been getting chest pain off and on in the anterior chest - nonradiating pressure-like.  In the ER EKG was concerning for ST elevation in inferior leads. On-call cardiologist was consulted by the ER physician. As per the Cardiologist the ST-T changes were nonspecific. Chest x-ray did not show anything acute. On exam patient was wheezing bilaterally and was febrile.   HPI/Subjective: Pt seen for f/u visit.  Assessment/Plan:  Acute bronchospastic exacerbation COPD  Hx of CAD s/p stenting w/ acute CP Trop negative x3   HTN  HLD  Tobacco abuse  Hyperglycemia  Code Status: FULL Family Communication: no family present at time of exam Disposition Plan:  SDU  Consultants: Cardiology  Procedures: none  Antibiotics: Rocephin 12/28 >> Azithro 12/28 >>  DVT prophylaxis: IV heparin >> lovenox  Objective: Blood pressure 109/74, pulse 68, temperature 97.6 F (36.4 C), temperature source Oral, resp. rate 17, height 5\' 11"  (1.803 m), weight 92.4 kg (203 lb 11.3 oz), SpO2 95.00%.  Intake/Output Summary (Last 24 hours) at 12/16/13 1210 Last data filed at 12/16/13 1100  Gross per 24 hour  Intake    330 ml  Output    900 ml  Net   -570 ml   Exam: F/u exam completed  Data Reviewed: Basic Metabolic Panel:  Recent Labs Lab 12/15/13 2200 12/16/13 0620  NA 140 137  K 4.1 4.2  CL 102 100  CO2 31 24  GLUCOSE 104* 251*  BUN 14 14  CREATININE 1.05 0.87  CALCIUM 9.0 8.3*   Liver Function Tests:  Recent Labs Lab  12/16/13 0620  AST 16  ALT 21  ALKPHOS 51  BILITOT 0.2*  PROT 5.9*  ALBUMIN 3.1*   No results found for this basename: LIPASE, AMYLASE,  in the last 168 hours No results found for this basename: AMMONIA,  in the last 168 hours CBC:  Recent Labs Lab 12/15/13 2200 12/16/13 0620  WBC 6.7 6.8  NEUTROABS  --  6.3  HGB 14.3 13.6  HCT 42.9 41.2  MCV 87.9 89.6  PLT 128* 133*   Cardiac Enzymes:  Recent Labs Lab 12/15/13 2200 12/16/13 0620 12/16/13 1025  TROPONINI <0.30 <0.30 <0.30   BNP (last 3 results)  Recent Labs  02/07/13 1025 12/15/13 2200  PROBNP 192.9* 78.6   CBG: No results found for this basename: GLUCAP,  in the last 168 hours  Recent Results (from the past 240 hour(s))  MRSA PCR SCREENING     Status: None   Collection Time    12/16/13  4:31 AM      Result Value Range Status   MRSA by PCR NEGATIVE  NEGATIVE Final   Comment:            The GeneXpert MRSA Assay (FDA     approved for NASAL specimens     only), is one component of a     comprehensive MRSA colonization     surveillance program. It is not     intended to diagnose MRSA     infection  nor to guide or     monitor treatment for     MRSA infections.     Studies:  Recent x-ray studies have been reviewed in detail by the Attending Physician  Scheduled Meds:  Scheduled Meds: . albuterol  2.5 mg Nebulization Q6H  . albuterol      . aspirin EC  325 mg Oral Daily  . azithromycin  500 mg Intravenous Q24H  . budesonide (PULMICORT) nebulizer solution  0.25 mg Nebulization BID  . cefTRIAXone (ROCEPHIN)  IV  1 g Intravenous Q24H  . clopidogrel  75 mg Oral Daily  . furosemide  20 mg Oral q morning - 10a  . [START ON 12/17/2013] influenza vac split quadrivalent PF  0.5 mL Intramuscular Tomorrow-1000  . ipratropium      . ipratropium  0.5 mg Nebulization Q6H  . methylPREDNISolone (SOLU-MEDROL) injection  40 mg Intravenous Q12H  . metoprolol  50 mg Oral BID  . nitroGLYCERIN  0.4 mg Transdermal  Daily  . pantoprazole (PROTONIX) IV  40 mg Intravenous Q24H  . sodium chloride  3 mL Intravenous Q12H  . sodium chloride  3 mL Intravenous Q12H    Time spent on care of this patient: 25+ mins   [facility] T  [facility] Office  [phone] Pager - Text Page per Loretha Stapler as per below:  On-Call/Text Page:      [email or web address]      password [email or web address]  If 7PM-7AM, please contact night-coverage [email or web address] Password [email or web address] 12/16/2013, 12:10 PM   LOS: 1 day

## 2013-12-16 NOTE — ED Notes (Signed)
[facility] called for transport. [facility] is backed up and would not be here until approx 7 a.m.

## 2013-12-16 NOTE — Consult Note (Signed)
Reason for Consult: Abnormal ECG Referring Physician: Dr. Micheline Maze Primary cardiologist: Dr. Luna Glasgow is an 62 y.o. male.  HPI: [name] is a 62 yo man with PMH of severe COPD, CAD with prior stents in '06, '08, last clean LHC 2010, negative nuclear stress 2011, last known EF 55-60% on 02/14 echo, dyslipidemia, hypertension, chronic tobacco abuse who presents with several days of cough, subjective fevers, chills, and shortness of breath and chest tightness more prominent this evening leading to presentation. [name] has been on a steroid burst for COPD without improvement, taking several nebulizers at home before calling EMS around 19:00 this evening. EMS gave [name] IV solumedrol, 2g IV magnesium and albuterol nebulizer. Cardiology consulted because of ECG with odd baseline in inferior leads. POC troponin negative. Treatment for COPD exacerbation and community acquired pneumonia initiated in the ER. He tells me he's had SOB and chest pain over at least the last two weeks - he's had CP before but it has been some time. The pain started right-sided and moved to left-sided, pressure and sharp. He's currently CP free. He does not associate the chest pain with deep breathing. The pain occurs with walking or at rest.    Past Medical History  Diagnosis Date  . COPD (chronic obstructive pulmonary disease)   . CAD (coronary artery disease)   . Hypertension   . Hyperlipidemia   . Osteomyelitis     Past Surgical History  Procedure Laterality Date  . Appendectomy      Family History  Problem Relation Age of Onset  . Coronary artery disease    . Hypertension      Social History:  reports that he has been smoking Cigarettes.  He has a 12 pack-year smoking history. He has never used smokeless tobacco. He reports that he does not drink alcohol or use illicit drugs.  Allergies:  Allergies  Allergen Reactions  . Lipitor [Atorvastatin Calcium] Other (See Comments)    Muscle aches     Medications: home medications reviewed Current Facility-Administered Medications  Medication Dose Route Frequency Provider Last Rate Last Dose  . albuterol (PROVENTIL,VENTOLIN) solution continuous neb  20 mg/hr Nebulization Continuous Shanna Cisco, MD   20 mg/hr at 12/15/13 2238   Current Outpatient Prescriptions  Medication Sig Dispense Refill  . albuterol (PROVENTIL) (2.5 MG/3ML) 0.083% nebulizer solution Take 3 mLs (2.5 mg total) by nebulization 4 (four) times daily.  225 mL  11  . aspirin 325 MG tablet Take 650 mg by mouth every morning.       Marland Kitchen aspirin-sod bicarb-citric acid (ALKA-SELTZER) 325 MG TBEF tablet Take 325 mg by mouth every 6 (six) hours as needed (for heartburn).      . budesonide (PULMICORT) 0.25 MG/2ML nebulizer solution Take 2 mLs (0.25 mg total) by nebulization 2 (two) times daily.  360 mL  11  . clopidogrel (PLAVIX) 75 MG tablet Take 75 mg by mouth daily.        . furosemide (LASIX) 20 MG tablet Take 20 mg by mouth every morning.      . metoprolol (LOPRESSOR) 50 MG tablet Take 1 tablet (50 mg total) by mouth 2 (two) times daily.  60 tablet  6  . predniSONE (DELTASONE) 20 MG tablet Take 20 mg by mouth daily.         Results for orders placed during the hospital encounter of 12/15/13 (from the past 48 hour(s))  CBC     Status: Abnormal   Collection Time  12/15/13 10:00 PM      Result Value Range   WBC 6.7  4.0 - 10.5 K/uL   RBC 4.88  4.22 - 5.81 MIL/uL   Hemoglobin 14.3  13.0 - 17.0 g/dL   HCT 21.3  08.6 - 57.8 %   MCV 87.9  78.0 - 100.0 fL   MCH 29.3  26.0 - 34.0 pg   MCHC 33.3  30.0 - 36.0 g/dL   RDW 46.9  62.9 - 52.8 %   Platelets 128 (*) 150 - 400 K/uL  BASIC METABOLIC PANEL     Status: Abnormal   Collection Time    12/15/13 10:00 PM      Result Value Range   Sodium 140  135 - 145 mEq/L   Potassium 4.1  3.5 - 5.1 mEq/L   Chloride 102  96 - 112 mEq/L   CO2 31  19 - 32 mEq/L   Glucose, Bld 104 (*) 70 - 99 mg/dL   BUN 14  6 - 23 mg/dL    Creatinine, Ser 4.13  0.50 - 1.35 mg/dL   Calcium 9.0  8.4 - 24.4 mg/dL   GFR calc non Af Amer 73 (*) >90 mL/min   GFR calc Af Amer 85 (*) >90 mL/min   Comment: (NOTE)     The eGFR has been calculated using the CKD EPI equation.     This calculation has not been validated in all clinical situations.     eGFR's persistently <90 mL/min signify possible Chronic Kidney     Disease.  TROPONIN I     Status: None   Collection Time    12/15/13 10:00 PM      Result Value Range   Troponin I <0.30  <0.30 ng/mL   Comment:            Due to the release kinetics of cTnI,     a negative result within the first hours     of the onset of symptoms does not rule out     myocardial infarction with certainty.     If myocardial infarction is still suspected,     repeat the test at appropriate intervals.  LACTIC ACID, PLASMA     Status: None   Collection Time    12/15/13 10:00 PM      Result Value Range   Lactic Acid, Venous 1.8  0.5 - 2.2 mmol/L  PRO B NATRIURETIC PEPTIDE     Status: None   Collection Time    12/15/13 10:00 PM      Result Value Range   Pro B Natriuretic peptide (BNP) 78.6  0 - 125 pg/mL  POCT I-STAT TROPONIN I     Status: None   Collection Time    12/15/13 10:05 PM      Result Value Range   Troponin i, poc 0.00  0.00 - 0.08 ng/mL   Comment 3            Comment: Due to the release kinetics of cTnI,     a negative result within the first hours     of the onset of symptoms does not rule out     myocardial infarction with certainty.     If myocardial infarction is still suspected,     repeat the test at appropriate intervals.  URINALYSIS, ROUTINE W REFLEX MICROSCOPIC     Status: None   Collection Time    12/15/13 10:54 PM      Result Value Range  Color, Urine YELLOW  YELLOW   APPearance CLEAR  CLEAR   Specific Gravity, Urine 1.015  1.005 - 1.030   pH 5.5  5.0 - 8.0   Glucose, UA NEGATIVE  NEGATIVE mg/dL   Hgb urine dipstick NEGATIVE  NEGATIVE   Bilirubin Urine NEGATIVE   NEGATIVE   Ketones, ur NEGATIVE  NEGATIVE mg/dL   Protein, ur NEGATIVE  NEGATIVE mg/dL   Urobilinogen, UA 0.2  0.0 - 1.0 mg/dL   Nitrite NEGATIVE  NEGATIVE   Leukocytes, UA NEGATIVE  NEGATIVE   Comment: MICROSCOPIC NOT DONE ON URINES WITH NEGATIVE PROTEIN, BLOOD, LEUKOCYTES, NITRITE, OR GLUCOSE <1000 mg/dL.    Dg Chest 2 View  12/15/2013   CLINICAL DATA:  Shortness of breath, chest pain, COPD  EXAM: CHEST  2 VIEW  COMPARISON:  08/25/2013  FINDINGS: Hyperinflation noted compatible with background COPD/ emphysema. No focal pneumonia, edema, effusion or pneumothorax. Trachea midline. Normal heart size and vascularity.  IMPRESSION: Chronic COPD/ emphysema.  No superimposed acute process   Electronically Signed   By: Ruel Favors M.D.   On: 12/15/2013 23:01    Review of Systems  Constitutional: Positive for fever, chills and malaise/fatigue.  HENT: Positive for ear pain.   Eyes: Negative for double vision and pain.  Respiratory: Positive for cough and shortness of breath.   Cardiovascular: Positive for chest pain. Negative for palpitations and orthopnea.  Gastrointestinal: Negative for nausea and vomiting.  Genitourinary: Negative for dysuria and urgency.  Musculoskeletal: Negative for myalgias and neck pain.  Skin: Negative for rash.  Neurological: Negative for dizziness, tingling, tremors and headaches.  Endo/Heme/Allergies: Does not bruise/bleed easily.  Psychiatric/Behavioral: Negative for depression, suicidal ideas and substance abuse.   Blood pressure 111/69, pulse 95, temperature 102.1 F (38.9 C), temperature source Rectal, resp. rate 19, SpO2 94.00%. Physical Exam  Nursing note and vitals reviewed. Constitutional: He is oriented to person, place, and time. He appears well-developed and well-nourished. No distress.  HENT:  Head: Normocephalic and atraumatic.  Nose: Nose normal.  Mouth/Throat: Oropharynx is clear and moist. No oropharyngeal exudate.  Eyes: Conjunctivae and EOM  are normal. Pupils are equal, round, and reactive to light. No scleral icterus.  Neck: Normal range of motion. Neck supple. No JVD present. No tracheal deviation present.  Cardiovascular: Normal rate, regular rhythm, normal heart sounds and intact distal pulses.  Exam reveals no gallop.   No murmur heard. Respiratory: Effort normal. No respiratory distress. He has wheezes. He has rales.  GI: Soft. Bowel sounds are normal. He exhibits no distension. There is no tenderness.  Musculoskeletal: Normal range of motion. He exhibits no edema.  Neurological: He is alert and oriented to person, place, and time. No cranial nerve deficit.  Skin: Skin is warm and dry. No rash noted. He is not diaphoretic. No erythema.  Psychiatric: His behavior is normal. Thought content normal.   Labs reviewed; wbc 6.7, 14.3/42.9 h/h, plt 128, na 140, K 4.1, bun/ccr 14/1.05, trop <0.3, proBNP 78 Urinalysis unrevealing Blood cx x2 sent; trop POC 0.00 Chest x-ray: chronic COPD changes EKG: LPFB, poor r-wave progression/old anterior infarct, HR 112 2/11 Nuclear stress negative 2/14 Echo, EF 55-60%, grade I DD Per old notes: 07/06 LHC with RCA stent in setting of inferior MI; 02/08 DES LCx NSTEMI, '08 UA with stent right PLA; 08/10 clean LHC, patent stents  Problem List Shortness of breath - Acute on chronic COPD exacerbation Community Acquired Pneumonia ? Chest pain Coronary Artery Disease Hypertension Dyslipidemia Ongoing Tobacco abuse  Assessment/Plan: 62 yo man with PMH of severe COPD, CAD, hypertension, dyslipidemia, ongoing tobacco use who presents with progressive cough, SOB and some chest tightness concerning for acute COPD exacerbation vs/and/or CAP. Chest tightness differential is musculoskeletal pain, pericarditis, pleuritis, related to COPD, ACS, among other etiologies. Initial POC troponin negative, ECG is not STEMI equivalent. As long as chest pain resolves with treatment for COPD and cardiac markers  remain negative, I have a suspicion given his fairly recent echo that this pain is noncardiac. However, he has real CAD and continued risk factors so stress testing at the least is probably warranted. Would favor repeat lexiscan if able to tolerate (did in 2011) given prior study in 2011.  - continue aspirin, plavix, metoprolol  - agree with and appreciate medicine treatment of COPD - telemetry, trend cardiac enzymes - likely stress test - consider lexiscan if cardiac markers remain negative  [name] 12/16/2013, 2:02 AM

## 2013-12-16 NOTE — ED Provider Notes (Signed)
CSN: [id number]     Arrival date & time 12/15/13  2141 History   First MD Initiated Contact with Patient 12/15/13 2205     Chief Complaint  Patient presents with  . Shortness of Breath   (Consider location/radiation/quality/duration/timing/severity/associated sxs/prior Treatment) HPI Comments: Pt is a 62 y.o. male with Pmhx as above who presents with respiratory distress. Pt repots several days cough, SOB, subjective fever, chills, migratory chest pains, and woke up from a nap acutely more SOB with chest tightness around 7pm tonight. Pt has been on steroid burst for COPD exacerbation but had not been improving. He used two nebs at home before calling EMS and reports he was 83% on RA.  EMS gave albuterol neb, IV solumedrol, 2g IV mag for resp distress.  Patient is a 62 y.o. male presenting with shortness of breath.  Shortness of Breath Severity:  Severe Onset quality:  Gradual Duration: acutely worse 7pm tonight. Timing:  Constant Progression:  Worsening Chronicity:  Recurrent Relieved by:  Nothing Worsened by:  Exertion and movement Ineffective treatments:  Rest, oxygen and inhaler Associated symptoms: chest pain, cough, fever, sputum production and wheezing   Associated symptoms: no abdominal pain, no headaches, no rash and no vomiting   Chest pain:    Chest pain quality: migratory last 2 days, today central tightness.   Severity:  Moderate   Onset quality:  Sudden   Duration:  3 hours   Timing:  Constant   Progression:  Unchanged   Chronicity:  New Cough:    Cough characteristics:  Productive   Severity:  Moderate   Onset quality:  Unable to specify   Timing:  Constant   Progression:  Worsening   Chronicity:  Recurrent Fever:    Duration:  1 day   Timing:  Intermittent   Temp source:  Subjective   Past Medical History  Diagnosis Date  . COPD (chronic obstructive pulmonary disease)   . CAD (coronary artery disease)   . Hypertension   . Hyperlipidemia   .  Osteomyelitis    Past Surgical History  Procedure Laterality Date  . Appendectomy     Family History  Problem Relation Age of Onset  . Coronary artery disease    . Hypertension     History  Substance Use Topics  . Smoking status: Current Some Day Smoker -- 0.30 packs/day for 40 years    Types: Cigarettes  . Smokeless tobacco: Never Used     Comment: approx .5 cigarettes per week   . Alcohol Use: No    Review of Systems  Constitutional: Positive for fever, chills, activity change and appetite change. Negative for fatigue.  HENT: Negative for congestion, facial swelling, rhinorrhea and trouble swallowing.   Eyes: Negative for photophobia and pain.  Respiratory: Positive for cough, sputum production, shortness of breath and wheezing. Negative for chest tightness.   Cardiovascular: Positive for chest pain. Negative for leg swelling.  Gastrointestinal: Negative for nausea, vomiting, abdominal pain, diarrhea and constipation.  Endocrine: Negative for polydipsia and polyuria.  Genitourinary: Negative for dysuria, urgency, decreased urine volume and difficulty urinating.  Musculoskeletal: Negative for back pain and gait problem.  Skin: Negative for color change, rash and wound.  Allergic/Immunologic: Negative for immunocompromised state.  Neurological: Negative for dizziness, facial asymmetry, speech difficulty, weakness, numbness and headaches.  Psychiatric/Behavioral: Negative for confusion, decreased concentration and agitation.    Allergies  Lipitor  Home Medications   Current Outpatient Rx  Name  Route  Sig  Dispense  Refill  .  albuterol (PROVENTIL) (2.5 MG/3ML) 0.083% nebulizer solution   Nebulization   Take 3 mLs (2.5 mg total) by nebulization 4 (four) times daily.   225 mL   11   . aspirin 325 MG tablet   Oral   Take 650 mg by mouth every morning.          Marland Kitchen aspirin-sod bicarb-citric acid (ALKA-SELTZER) 325 MG TBEF tablet   Oral   Take 325 mg by mouth every 6  (six) hours as needed (for heartburn).         . budesonide (PULMICORT) 0.25 MG/2ML nebulizer solution   Nebulization   Take 2 mLs (0.25 mg total) by nebulization 2 (two) times daily.   360 mL   11     Dx Code 496   . clopidogrel (PLAVIX) 75 MG tablet   Oral   Take 75 mg by mouth daily.           . furosemide (LASIX) 20 MG tablet   Oral   Take 20 mg by mouth every morning.         . metoprolol (LOPRESSOR) 50 MG tablet   Oral   Take 1 tablet (50 mg total) by mouth 2 (two) times daily.   60 tablet   6   . predniSONE (DELTASONE) 20 MG tablet   Oral   Take 20 mg by mouth daily.          BP 108/76  Pulse 100  Temp(Src) 102.1 F (38.9 C) (Rectal)  Resp 27  SpO2 99% Physical Exam  Constitutional: He is oriented to person, place, and time. He appears well-developed and well-nourished. He appears distressed.  HENT:  Head: Normocephalic and atraumatic.  Mouth/Throat: No oropharyngeal exudate.  Eyes: Pupils are equal, round, and reactive to light.  Neck: Normal range of motion. Neck supple.  Cardiovascular: Regular rhythm and normal heart sounds.  Tachycardia present.  Exam reveals no gallop and no friction rub.   No murmur heard. Pulmonary/Chest: Accessory muscle usage present. Tachypnea noted. No respiratory distress. He has decreased breath sounds in the right upper field, the right middle field, the right lower field and the left upper field. He has wheezes in the right upper field, the right middle field, the right lower field, the left upper field, the left middle field and the left lower field. He has no rales.  Abdominal: Soft. Bowel sounds are normal. He exhibits no distension and no mass. There is no tenderness. There is no rebound and no guarding.  Musculoskeletal: Normal range of motion. He exhibits no edema and no tenderness.  Neurological: He is alert and oriented to person, place, and time.  Skin: Skin is warm and dry.  Psychiatric: He has a normal mood  and affect.    ED Course  Procedures (including critical care time) Labs Review Labs Reviewed  CBC - Abnormal; Notable for the following:    Platelets 128 (*)    All other components within normal limits  BASIC METABOLIC PANEL - Abnormal; Notable for the following:    Glucose, Bld 104 (*)    GFR calc non Af Amer 73 (*)    GFR calc Af Amer 85 (*)    All other components within normal limits  CULTURE, BLOOD (ROUTINE X 2)  CULTURE, BLOOD (ROUTINE X 2)  RESPIRATORY VIRUS PANEL  TROPONIN I  LACTIC ACID, PLASMA  URINALYSIS, ROUTINE W REFLEX MICROSCOPIC  PRO B NATRIURETIC PEPTIDE  POCT I-STAT TROPONIN I   Imaging Review Dg Chest  2 View  12/15/2013   CLINICAL DATA:  Shortness of breath, chest pain, COPD  EXAM: CHEST  2 VIEW  COMPARISON:  08/25/2013  FINDINGS: Hyperinflation noted compatible with background COPD/ emphysema. No focal pneumonia, edema, effusion or pneumothorax. Trachea midline. Normal heart size and vascularity.  IMPRESSION: Chronic COPD/ emphysema.  No superimposed acute process   Electronically Signed   By: Ruel Favors M.D.   On: 12/15/2013 23:01    EKG Interpretation    Date/Time:  Saturday December 15 2013 21:59:05 EST Ventricular Rate:  112 PR Interval:  152 QRS Duration: 109 QT Interval:  323 QTC Calculation: 441 R Axis:   101 Text Interpretation:  Sinus tachycardia Left posterior fascicular block Anterior infarct, old Miniminal STE in leads II, III, aVF, V3 Confirmed by Alayasia Breeding  MD, [place] [phone]) on 12/15/2013 10:13:54 PM           CRITICAL CARE Performed by: Toy Cookey, E Total critical care time: 35 Critical care time was exclusive of separately billable procedures and treating other patients. Critical care was necessary to treat or prevent imminent or life-threatening deterioration. Critical care was time spent personally by me on the following activities: development of treatment plan with patient and/or surrogate as well as nursing,  discussions with consultants, evaluation of patient's response to treatment, examination of patient, obtaining history from patient or surrogate, ordering and performing treatments and interventions, ordering and review of laboratory studies, ordering and review of radiographic studies, pulse oximetry and re-evaluation of patient's condition.  MDM   1. Respiratory distress   2. COPD with acute exacerbation   3. Fever   4. CAP (community acquired pneumonia)    Pt is a 62 y.o. male with Pmhx as above who presents with respiratory distress. Pt repots several days cough, SOB, subjective fever, chills, migratory chest pains, and woke up from a nap acutely more SOB with chest tightness around 7pm tonight. Pt has been on steroid burst for COPD exacerbation but had not been improving. He used two nebs at home before calling EMS and reports he was 83% on RA.  EMS gave albuterol neb, IV solumedrol, 2g IV mag for resp distress. Upon arrival, pt in respiratory distress with tachycardic, tachypneic, with inc WOB and poor air mvmt but stated he was feeling somewhat improved. EKG w/ minimal STE in inferior leads.  I spoke w/ Dr. Allyson Sabal w/ cardiology who reviewed EKGs but did not feel pt met STEMi criteria.  He recommended medical admit to step down at [facility] with cardiology consult.  He was found to have elevated rectal temp.  Although CXR w/o acute opacities, I started rocephin/azithro for CAP given clinical symptoms, temp.  RVP also sent.  First trop negative. LA nml BNP not elevated. I spoke w/ cardiology at [facility] who agree with plan.  [facility] consulted for admission.         Shanna Cisco, MD 12/16/13 [phone]

## 2013-12-16 NOTE — Progress Notes (Addendum)
ANTICOAGULATION CONSULT NOTE - Follow Up Consult  Pharmacy Consult for heparin Indication: chest pain/ACS  Allergies  Allergen Reactions  . Lipitor [Atorvastatin Calcium] Other (See Comments)    Muscle aches    Patient Measurements: Height: 5\' 11"  (180.3 cm) Weight: 203 lb 11.3 oz (92.4 kg) IBW/kg (Calculated) : 75.3 Heparin Dosing Weight: 92.4 kg  Vital Signs: Temp: 97.6 F (36.4 C) (12/28 1136) Temp src: Oral (12/28 1136) BP: 109/74 mmHg (12/28 1136) Pulse Rate: 68 (12/28 1136)  Labs:  Recent Labs  12/15/13 2200 12/16/13 0620 12/16/13 1025  HGB 14.3 13.6  --   HCT 42.9 41.2  --   PLT 128* 133*  --   CREATININE 1.05 0.87  --   TROPONINI <0.30 <0.30 <0.30    Estimated Creatinine Clearance: 99.6 ml/min (by C-G formula based on Cr of 0.87).   Medications:  Scheduled:  . albuterol  2.5 mg Nebulization Q6H  . albuterol      . aspirin EC  325 mg Oral Daily  . azithromycin  500 mg Intravenous Q24H  . budesonide (PULMICORT) nebulizer solution  0.25 mg Nebulization BID  . cefTRIAXone (ROCEPHIN)  IV  1 g Intravenous Q24H  . clopidogrel  75 mg Oral Daily  . furosemide  20 mg Oral q morning - 10a  . [START ON 12/17/2013] influenza vac split quadrivalent PF  0.5 mL Intramuscular Tomorrow-1000  . ipratropium      . ipratropium  0.5 mg Nebulization Q6H  . methylPREDNISolone (SOLU-MEDROL) injection  40 mg Intravenous Q12H  . metoprolol  50 mg Oral BID  . nitroGLYCERIN  0.4 mg Transdermal Daily  . pantoprazole (PROTONIX) IV  40 mg Intravenous Q24H  . sodium chloride  3 mL Intravenous Q12H  . sodium chloride  3 mL Intravenous Q12H   Infusions:  . heparin 1,000 Units/hr (12/16/13 0800)    Assessment: 62 yo male with chest pain is currently on therapeutic heparin.  Heparin level is 0.39 Goal of Therapy:  Heparin level 0.3-0.7 units/ml Monitor platelets by anticoagulation protocol: Yes   Plan:  1) Cont heparin at 1000 units/hr.  Repeat heparin level at 1900 to  confirm 2) Daily heparin level and CBC  Jahrell Hamor, Tsz-Yin 12/16/2013,12:50 PM

## 2013-12-16 NOTE — ED Notes (Signed)
Report given to Statistician at [facility].

## 2013-12-16 NOTE — Progress Notes (Signed)
ANTICOAGULATION CONSULT NOTE - Initial Consult  Pharmacy Consult for Heparin Indication: chest pain/ACS  Allergies  Allergen Reactions  . Lipitor [Atorvastatin Calcium] Other (See Comments)    Muscle aches    Patient Measurements: Height: 5\' 11"  (180.3 cm) Weight: 205 lb (92.987 kg) IBW/kg (Calculated) : 75.3 Heparin Dosing Weight:   Vital Signs: Temp: 102.1 F (38.9 C) (12/27 2250) Temp src: Rectal (12/27 2250) BP: 112/72 mmHg (12/28 0200) Pulse Rate: 94 (12/28 0200)  Labs:  Recent Labs  12/15/13 2200  HGB 14.3  HCT 42.9  PLT 128*  CREATININE 1.05  TROPONINI <0.30    Estimated Creatinine Clearance: 82.8 ml/min (by C-G formula based on Cr of 1.05).   Medical History: Past Medical History  Diagnosis Date  . COPD (chronic obstructive pulmonary disease)   . CAD (coronary artery disease)   . Hypertension   . Hyperlipidemia   . Osteomyelitis     Medications:  Infusions:  . albuterol Stopped (12/16/13 0018)  . heparin 1,000 Units/hr (12/16/13 0240)    Assessment: Patient with CP.  MD wants heparin per pharmacy.  Patient to transfer to [facility].  Goal of Therapy:  Heparin level 0.3-0.7 units/ml Monitor platelets by anticoagulation protocol: Yes   Plan:  Heparin bolus 4000 units iv x1 Heparin drip at 1000 units/hr Daily heparin level and CBC Next heparin level at  9-10am, will call [facility] to make them aware of need for level    Darlina Guys, Jacquenette Shone Crowford 12/16/2013,2:46 AM

## 2013-12-16 NOTE — Progress Notes (Signed)
Patient seen briefly; agree with fellow note; continue therapy for COPD; cycle enzymes; if negative, outpatient myovue. Olga Millers

## 2013-12-16 NOTE — Progress Notes (Signed)
Patient arrived as transfer from [facility] in stable condition, experiencing nausea. Vital signs: Temperature 98.1, pulse 89 bpm, BP 128/66 mmHg,  Respirations 16, oxygen saturation 96% on 2L [place]

## 2013-12-17 DIAGNOSIS — R0609 Other forms of dyspnea: Secondary | ICD-10-CM

## 2013-12-17 LAB — RESPIRATORY VIRUS PANEL
Influenza A H3: NOT DETECTED
Parainfluenza 1: NOT DETECTED
Parainfluenza 2: NOT DETECTED
Parainfluenza 3: NOT DETECTED
Respiratory Syncytial Virus B: NOT DETECTED
Rhinovirus: NOT DETECTED

## 2013-12-17 LAB — COMPREHENSIVE METABOLIC PANEL
AST: 16 U/L (ref 0–37)
Alkaline Phosphatase: 45 U/L (ref 39–117)
BUN: 18 mg/dL (ref 6–23)
CO2: 29 mEq/L (ref 19–32)
Calcium: 8.2 mg/dL — ABNORMAL LOW (ref 8.4–10.5)
Chloride: 101 mEq/L (ref 96–112)
Creatinine, Ser: 0.74 mg/dL (ref 0.50–1.35)
GFR calc Af Amer: >90 mL/min (ref >90)
GFR calc non Af Amer: >90 mL/min (ref >90)
Glucose, Bld: 178 mg/dL — ABNORMAL HIGH (ref 70–99)
Potassium: 4.2 mEq/L (ref 3.5–5.1)
Total Bilirubin: 0.2 mg/dL — ABNORMAL LOW (ref 0.3–1.2)
Total Protein: 5.4 g/dL — ABNORMAL LOW (ref 6.0–8.3)

## 2013-12-17 LAB — GLUCOSE, CAPILLARY
Glucose-Capillary: 125 mg/dL — ABNORMAL HIGH (ref 70–99)
Glucose-Capillary: 144 mg/dL — ABNORMAL HIGH (ref 70–99)
Glucose-Capillary: 186 mg/dL — ABNORMAL HIGH (ref 70–99)

## 2013-12-17 LAB — CBC
Hemoglobin: 12.8 g/dL — ABNORMAL LOW (ref 13.0–17.0)
MCH: 29.8 pg (ref 26.0–34.0)
MCHC: 33.3 g/dL (ref 30.0–36.0)
MCV: 89.3 fL (ref 78.0–100.0)
Platelets: 135 10*3/uL — ABNORMAL LOW (ref 150–400)
RBC: 4.3 MIL/uL (ref 4.22–5.81)

## 2013-12-17 LAB — HEMOGLOBIN A1C: Hgb A1c MFr Bld: 6.4 % — ABNORMAL HIGH (ref <5.7)

## 2013-12-17 MED ORDER — HYDROCODONE-ACETAMINOPHEN 5-325 MG PO TABS
1.0000 | ORAL_TABLET | ORAL | Status: DC | PRN
  Administered 2013-12-17 – 2013-12-25 (×11): 1 via ORAL
  Filled 2013-12-17 (×11): qty 1

## 2013-12-17 NOTE — Progress Notes (Signed)
SUBJECTIVE:  Breathing is somewhat better.  He is not at baseline.  He is not currently having chest pain.     PHYSICAL EXAM Filed Vitals:   12/17/13 0025 12/17/13 0400 12/17/13 0803 12/17/13 0914  BP:  117/68 109/78   Pulse:  62 67   Temp:  97.6 F (36.4 C) 97.3 F (36.3 C)   TempSrc:  Oral Oral   Resp:  16    Height:      Weight:      SpO2: 98% 97% 92% 99%   General:  No distress Lungs:  Diffuse wheezing Heart:  RRR Abdomen:  Positive bowel sounds, no rebound no guarding Extremities:  No edema  LABS: Lab Results  Component Value Date   TROPONINI <0.30 12/16/2013   Results for orders placed during the hospital encounter of 12/15/13 (from the past 24 hour(s))  TROPONIN I     Status: None   Collection Time    12/16/13 10:25 AM      Result Value Range   Troponin I <0.30  <0.30 ng/mL  GLUCOSE, CAPILLARY     Status: Abnormal   Collection Time    12/16/13 12:32 PM      Result Value Range   Glucose-Capillary 166 (*) 70 - 99 mg/dL  HEPARIN LEVEL (UNFRACTIONATED)     Status: None   Collection Time    12/16/13  1:02 PM      Result Value Range   Heparin Unfractionated 0.39  0.30 - 0.70 IU/mL  TROPONIN I     Status: None   Collection Time    12/16/13  4:15 PM      Result Value Range   Troponin I <0.30  <0.30 ng/mL  GLUCOSE, CAPILLARY     Status: Abnormal   Collection Time    12/16/13  5:05 PM      Result Value Range   Glucose-Capillary 131 (*) 70 - 99 mg/dL  GLUCOSE, CAPILLARY     Status: Abnormal   Collection Time    12/16/13  9:32 PM      Result Value Range   Glucose-Capillary 188 (*) 70 - 99 mg/dL   Comment 1 Documented in Chart     Comment 2 Notify RN    CBC     Status: Abnormal   Collection Time    12/17/13  4:47 AM      Result Value Range   WBC 7.8  4.0 - 10.5 K/uL   RBC 4.30  4.22 - 5.81 MIL/uL   Hemoglobin 12.8 (*) 13.0 - 17.0 g/dL   HCT 30.8 (*) 65.7 - 84.6 %   MCV 89.3  78.0 - 100.0 fL   MCH 29.8  26.0 - 34.0 pg   MCHC 33.3  30.0 - 36.0 g/dL     RDW 96.2  95.2 - 84.1 %   Platelets 135 (*) 150 - 400 K/uL  COMPREHENSIVE METABOLIC PANEL     Status: Abnormal   Collection Time    12/17/13  4:47 AM      Result Value Range   Sodium 137  135 - 145 mEq/L   Potassium 4.2  3.5 - 5.1 mEq/L   Chloride 101  96 - 112 mEq/L   CO2 29  19 - 32 mEq/L   Glucose, Bld 178 (*) 70 - 99 mg/dL   BUN 18  6 - 23 mg/dL   Creatinine, Ser 3.24  0.50 - 1.35 mg/dL   Calcium 8.2 (*) 8.4 - 10.5 mg/dL  Total Protein 5.4 (*) 6.0 - 8.3 g/dL   Albumin 2.9 (*) 3.5 - 5.2 g/dL   AST 16  0 - 37 U/L   ALT 19  0 - 53 U/L   Alkaline Phosphatase 45  39 - 117 U/L   Total Bilirubin 0.2 (*) 0.3 - 1.2 mg/dL   GFR calc non Af Amer >90  >90 mL/min   GFR calc Af Amer >90  >90 mL/min  GLUCOSE, CAPILLARY     Status: Abnormal   Collection Time    12/17/13  8:02 AM      Result Value Range   Glucose-Capillary 125 (*) 70 - 99 mg/dL    Intake/Output Summary (Last 24 hours) at 12/17/13 0943 Last data filed at 12/17/13 0900  Gross per 24 hour  Intake    790 ml  Output   1900 ml  Net  -1110 ml     ASSESSMENT AND PLAN:  CHEST PAIN:   Enzymes negative x 3.  No objective evidence of ischemia.  Probable outpatient Lexiscan Myoview.    COPD:  Plan per primary team.   Rollene Rotunda 12/17/2013 9:43 AM

## 2013-12-17 NOTE — Progress Notes (Signed)
Inpatient Diabetes Program Recommendations  AACE/ADA: New Consensus Statement on Inpatient Glycemic Control (2013)  Target Ranges:  Prepandial:   less than 140 mg/dL      Peak postprandial:   less than 180 mg/dL (1-2 hours)      Critically ill patients:  140 - 180 mg/dL   Hyperglycemia while on steroid therapy.  Inpatient Diabetes Program Recommendations Diet: May want to add/change diet to carbohydrate modified while on high dose steroids.  Thank you, Lenor Coffin, RN, CNS, Diabetes Coordinator [phone])

## 2013-12-17 NOTE — Progress Notes (Signed)
[facility] Progress Note [place] TEAM 1 - Stepdown/ICU TEAM   [name] ZOX:[id number] DOB: 1951/09/24 DOA: 12/15/2013 PCP: No PCP Per Patient  Admit HPI / Brief Narrative: 62 y.o. male with history of COPD on home oxygen and chronic steroid therapy, CAD status post stenting presented to the ER because of chest pain and short of breath. Patient stated that he'd been having chronic shortness of breath but it had worsened.  The patient had also been getting chest pain off and on in the anterior chest - nonradiating pressure-like.  In the ER EKG was concerning for ST elevation in inferior leads. On-call cardiologist was consulted by the ER physician. As per the Cardiologist the ST-T changes were nonspecific. Chest x-ray did not show anything acute. On exam patient was wheezing bilaterally and was febrile.   HPI/Subjective: The patient states that he feels better today but remains severely short of breath.  He continues to report intermittent bandlike chest tightness that waxes and wanes.  He denies nausea vomiting fevers or chills.  Assessment/Plan:  Acute bronchospastic exacerbation COPD Slowly improving but not yet at baseline - continue maximal medical therapy - still waiting on influenza screen  Hx of CAD s/p stenting w/ acute CP Trop negative x3 - cardiology not impressed and symptoms are suggestive of angina - plan is for outpatient stress testing once pulmonary status is stable   HTN Blood pressure is currently well controlled   Tobacco abuse Counseled on the absolute need to discontinue smoking  - patient states that he has  Hyperglycemia A1c does not meet criteria for true diabetes - continue sliding scale insulin while on steroids  Code Status: FULL Family Communication: no family present at time of exam Disposition Plan:  Stable for transfer to telemetry bed  Consultants: Cardiology  Procedures: none  Antibiotics: Rocephin 12/28 >> Azithro 12/28  >>  DVT prophylaxis: IV heparin >> lovenox  Objective: Blood pressure 109/78, pulse 67, temperature 97.3 F (36.3 C), temperature source Oral, resp. rate 16, height 5\' 11"  (1.803 m), weight 92.4 kg (203 lb 11.3 oz), SpO2 91.00%.  Intake/Output Summary (Last 24 hours) at 12/17/13 1136 Last data filed at 12/17/13 0900  Gross per 24 hour  Intake    770 ml  Output   1400 ml  Net   -630 ml   Exam: General: Mild respiratory distress at rest - continues to require supplemental oxygen Lungs: Very poor air movement throughout all fields with mild diffuse expiratory wheezing but no focal crackles Cardiovascular: Regular rate and rhythm without murmur gallop or rub normal S1 and S2 - distant heart sounds Abdomen: Obese, nontender, nondistended, soft, bowel sounds positive, no rebound, no ascites, no appreciable mass Extremities: No significant cyanosis, clubbing, or edema bilateral lower extremities   Data Reviewed: Basic Metabolic Panel:  Recent Labs Lab 12/15/13 2200 12/16/13 0620 12/17/13 0447  NA 140 137 137  K 4.1 4.2 4.2  CL 102 100 101  CO2 31 24 29   GLUCOSE 104* 251* 178*  BUN 14 14 18   CREATININE 1.05 0.87 0.74  CALCIUM 9.0 8.3* 8.2*   Liver Function Tests:  Recent Labs Lab 12/16/13 0620 12/17/13 0447  AST 16 16  ALT 21 19  ALKPHOS 51 45  BILITOT 0.2* 0.2*  PROT 5.9* 5.4*  ALBUMIN 3.1* 2.9*   CBC:  Recent Labs Lab 12/15/13 2200 12/16/13 0620 12/17/13 0447  WBC 6.7 6.8 7.8  NEUTROABS  --  6.3  --   HGB 14.3 13.6 12.8*  HCT 42.9 41.2 38.4*  MCV 87.9 89.6 89.3  PLT 128* 133* 135*   Cardiac Enzymes:  Recent Labs Lab 12/15/13 2200 12/16/13 0620 12/16/13 1025 12/16/13 1615  TROPONINI <0.30 <0.30 <0.30 <0.30   BNP (last 3 results)  Recent Labs  02/07/13 1025 12/15/13 2200  PROBNP 192.9* 78.6   CBG:  Recent Labs Lab 12/16/13 0538 12/16/13 1232 12/16/13 1705 12/16/13 2132 12/17/13 0802  GLUCAP 236* 166* 131* 188* 125*    Recent  Results (from the past 240 hour(s))  CULTURE, BLOOD (ROUTINE X 2)     Status: None   Collection Time    12/15/13 11:05 PM      Result Value Range Status   Specimen Description BLOOD RIGHT ANTECUBITAL   Final   Special Requests BOTTLES DRAWN AEROBIC AND ANAEROBIC 3CC   Final   Culture  Setup Time     Final   Value: 12/16/2013 03:06     Performed at [facility]   Culture     Final   Value:        BLOOD CULTURE RECEIVED NO GROWTH TO DATE CULTURE WILL BE HELD FOR 5 DAYS BEFORE ISSUING A FINAL NEGATIVE REPORT     Performed at [facility]   Report Status PENDING   Incomplete  CULTURE, BLOOD (ROUTINE X 2)     Status: None   Collection Time    12/15/13 11:10 PM      Result Value Range Status   Specimen Description BLOOD RIGHT FOREARM   Final   Special Requests BOTTLES DRAWN AEROBIC ONLY 4CC   Final   Culture  Setup Time     Final   Value: 12/16/2013 03:06     Performed at [facility]   Culture     Final   Value:        BLOOD CULTURE RECEIVED NO GROWTH TO DATE CULTURE WILL BE HELD FOR 5 DAYS BEFORE ISSUING A FINAL NEGATIVE REPORT     Performed at [facility]   Report Status PENDING   Incomplete  MRSA PCR SCREENING     Status: None   Collection Time    12/16/13  4:31 AM      Result Value Range Status   MRSA by PCR NEGATIVE  NEGATIVE Final   Comment:            The GeneXpert MRSA Assay (FDA     approved for NASAL specimens     only), is one component of a     comprehensive MRSA colonization     surveillance program. It is not     intended to diagnose MRSA     infection nor to guide or     monitor treatment for     MRSA infections.     Studies:  Recent x-ray studies have been reviewed in detail by the Attending Physician  Scheduled Meds:  Scheduled Meds: . albuterol  2.5 mg Nebulization QID  . aspirin EC  325 mg Oral Daily  . azithromycin  500 mg Intravenous Q24H  . budesonide (PULMICORT) nebulizer solution  0.25 mg Nebulization BID  .  cefTRIAXone (ROCEPHIN)  IV  1 g Intravenous Q24H  . clopidogrel  75 mg Oral Daily  . enoxaparin (LOVENOX) injection  40 mg Subcutaneous Q24H  . furosemide  20 mg Oral q morning - 10a  . insulin aspart  0-9 Units Subcutaneous TID WC  . ipratropium  0.5 mg Nebulization QID  . methylPREDNISolone (SOLU-MEDROL)  injection  40 mg Intravenous Q12H  . metoprolol  50 mg Oral BID  . pantoprazole  40 mg Oral Q1200    Time spent on care of this patient: 35 mins   [facility]  [facility] Office  [phone] Pager - Text Page per Loretha Stapler as per below:  On-Call/Text Page:      [email or web address]      password [email or web address]  If 7PM-7AM, please contact night-coverage [email or web address] Password [facility] 12/17/2013, 11:36 AM   LOS: 2 days

## 2013-12-17 NOTE — Progress Notes (Signed)
Report called to receiving Rn [id number]. Will transfer to room via WC. Patient with no complaints at current time.

## 2013-12-17 NOTE — Care Management Note (Unsigned)
    Page 1 of 2   12/24/2013     3:14:50 PM   CARE MANAGEMENT NOTE 12/24/2013  Patient:  [name], [name]   Account Number:  [id number]  Date Initiated:  12/17/2013  Documentation initiated by:  Junius Creamer  Subjective/Objective Assessment:   adm w copd exacerbation, influenza     Action/Plan:   lives w fam, chart states has home o2   Anticipated DC Date:  12/23/2013   Anticipated DC Plan:  HOME W HOME HEALTH SERVICES      DC Planning Services  CM consult  [facility]  [facility]      Choice offered to / List presented to:          [facility] arranged  HH - 11 Patient Refused      Status of service:  In process, will continue to follow Medicare Important Message given?   (If response is "NO", the following Medicare IM given date fields will be blank) Date Medicare IM given:   Date Additional Medicare IM given:    Discharge Disposition:    Per UR Regulation:  Reviewed for med. necessity/level of care/duration of stay  If discussed at Long Length of Stay Meetings, dates discussed:    Comments:  12/24/13 Acey Woodfield,RN,BSN [phone] MET WITH PT TO DISCUSS DC PLANS.  PT STATES HE LIVES WITH DAUGHTER, WHO CAN PROVIDE 24HR CARE.  HE POLITELY DECLINED HH FOLLOW UP, STATING THAT HE HAS HAD IT BEFORE, AND IT WAS NOT HELPFUL FOR HIM.  FOLLOW UP APPT MADE AT [facility] FOR 01/10/14 AT 11:30AM.  WILL PROVIDE MATCH LETTER FOR PT UPON DC FOR ASSISTANCE WITH DC MEDS, AS HE IS ELIGIBLE FOR MATCH PROGRAM.  12/21/13 Luree Palla,RN,BSN [phone] P.T. RECOMMENDING HHPT AT DC; WILL LIKELY BE UNABLE TO ARRANGE, AS PT HAS NO PAYOR SOURCE FOR HH SERVICES, AND WOULD HAVE TO SELF PAY FOR THESE SERVICES.  LEFT MESSAGE FOR [facility] REGARDING FOLLOW UP APPT.  12/29 1140 debbie dowell rn,bsn spoke w pt. he does not have ins. he is agreeable to me trying to get appt w [facility]. hx of [facility] but nothing at  present. gave pt predcription discount card that may help w brand name meds. will cont to follow. will text [facility] for appt.

## 2013-12-18 DIAGNOSIS — J449 Chronic obstructive pulmonary disease, unspecified: Secondary | ICD-10-CM

## 2013-12-18 DIAGNOSIS — J9600 Acute respiratory failure, unspecified whether with hypoxia or hypercapnia: Secondary | ICD-10-CM

## 2013-12-18 DIAGNOSIS — J111 Influenza due to unidentified influenza virus with other respiratory manifestations: Secondary | ICD-10-CM

## 2013-12-18 LAB — RESPIRATORY VIRUS PANEL
Adenovirus: NOT DETECTED
Influenza A H1: NOT DETECTED
Influenza A H3: DETECTED — AB
Influenza A: DETECTED — AB
Respiratory Syncytial Virus B: NOT DETECTED
Rhinovirus: NOT DETECTED

## 2013-12-18 LAB — GLUCOSE, CAPILLARY
Glucose-Capillary: 122 mg/dL — ABNORMAL HIGH (ref 70–99)
Glucose-Capillary: 170 mg/dL — ABNORMAL HIGH (ref 70–99)
Glucose-Capillary: 103 mg/dL — ABNORMAL HIGH (ref 70–99)

## 2013-12-18 LAB — INFLUENZA PANEL BY PCR (TYPE A & B)
H1N1 flu by pcr: NOT DETECTED
Influenza B By PCR: NEGATIVE

## 2013-12-18 MED ORDER — OSELTAMIVIR PHOSPHATE 75 MG PO CAPS
75.0000 mg | ORAL_CAPSULE | Freq: Two times a day (BID) | ORAL | Status: AC
  Administered 2013-12-18 – 2013-12-22 (×10): 75 mg via ORAL
  Filled 2013-12-18 (×10): qty 1

## 2013-12-18 MED ORDER — ALBUTEROL SULFATE (2.5 MG/3ML) 0.083% IN NEBU: 2.5000 mg | INHALATION_SOLUTION | RESPIRATORY_TRACT | Status: DC | PRN

## 2013-12-18 MED ORDER — ALBUTEROL SULFATE (2.5 MG/3ML) 0.083% IN NEBU
2.5000 mg | INHALATION_SOLUTION | Freq: Four times a day (QID) | RESPIRATORY_TRACT | Status: DC
  Administered 2013-12-18 (×2): 2.5 mg via RESPIRATORY_TRACT
  Filled 2013-12-18 (×2): qty 3

## 2013-12-18 MED ORDER — METHYLPREDNISOLONE SODIUM SUCC 125 MG IJ SOLR
60.0000 mg | Freq: Four times a day (QID) | INTRAMUSCULAR | Status: DC
  Administered 2013-12-18 – 2013-12-21 (×11): 60 mg via INTRAVENOUS
  Filled 2013-12-18 (×18): qty 0.96

## 2013-12-18 MED ORDER — IPRATROPIUM-ALBUTEROL 0.5-2.5 (3) MG/3ML IN SOLN
3.0000 mL | RESPIRATORY_TRACT | Status: DC | PRN
  Administered 2013-12-19 – 2013-12-24 (×5): 3 mL via RESPIRATORY_TRACT
  Filled 2013-12-18 (×5): qty 3

## 2013-12-18 MED ORDER — IPRATROPIUM-ALBUTEROL 0.5-2.5 (3) MG/3ML IN SOLN
3.0000 mL | RESPIRATORY_TRACT | Status: DC
  Administered 2013-12-18 – 2013-12-25 (×42): 3 mL via RESPIRATORY_TRACT
  Filled 2013-12-18 (×84): qty 3

## 2013-12-18 NOTE — Progress Notes (Signed)
[facility] PROGRESS NOTE  [name] GNF:[id number] DOB: 13-Apr-1951 DOA: 12/15/2013 PCP: No PCP Per Patient  Assessment/Plan: Acute bronchospastic exacerbation COPD  Slowly improving but not yet at baseline  Influenza screen POSITIVE Influenza Start tamiflu Hx of CAD s/p stenting w/ acute CP  Trop negative x3 - cardiology not impressed and symptoms are suggestive of angina - plan is for outpatient stress testing once pulmonary status is stable  HTN  Blood pressure is currently well controlled  Tobacco abuse  Counseled on the absolute need to discontinue smoking - patient states that he has  Hyperglycemia  A1c does not meet criteria for true diabetes - continue sliding scale insulin while on steroids  Code Status: Full Family Communication: Pt in room (indicate person spoken with, relationship, and if by phone, the number) Disposition Plan: Pending   Consultants:  Cardiology  Antibiotics: Rocephin 12/28 >> 12/30 Azithro 12/28 >>12/30 Tamiflu 12/30>>>  HPI/Subjective: No acute events noted overnight  Objective: Filed Vitals:   12/18/13 0925 12/18/13 1132 12/18/13 1216 12/18/13 1229  BP:   135/92   Pulse:  85 92   Temp:      TempSrc:      Resp:      Height:      Weight:      SpO2: 97%   97%    Intake/Output Summary (Last 24 hours) at 12/18/13 1302 Last data filed at 12/18/13 0915  Gross per 24 hour  Intake    540 ml  Output    875 ml  Net   -335 ml   Filed Weights   12/16/13 0206 12/16/13 0416  Weight: 92.987 kg (205 lb) 92.4 kg (203 lb 11.3 oz)    Exam:   General:  Awake, in nad  Cardiovascular: regular, s1, s2  Respiratory: decreased BS, wheezing throughout  Abdomen: soft, nondistended  Musculoskeletal: perfused, no clubbing   Data Reviewed: Basic Metabolic Panel:  Recent Labs Lab 12/15/13 2200 12/16/13 0620 12/17/13 0447  NA 140 137 137  K 4.1 4.2 4.2  CL 102 100 101  CO2 31 24 29   GLUCOSE 104* 251* 178*  BUN 14 14 18    CREATININE 1.05 0.87 0.74  CALCIUM 9.0 8.3* 8.2*   Liver Function Tests:  Recent Labs Lab 12/16/13 0620 12/17/13 0447  AST 16 16  ALT 21 19  ALKPHOS 51 45  BILITOT 0.2* 0.2*  PROT 5.9* 5.4*  ALBUMIN 3.1* 2.9*   No results found for this basename: LIPASE, AMYLASE,  in the last 168 hours No results found for this basename: AMMONIA,  in the last 168 hours CBC:  Recent Labs Lab 12/15/13 2200 12/16/13 0620 12/17/13 0447  WBC 6.7 6.8 7.8  NEUTROABS  --  6.3  --   HGB 14.3 13.6 12.8*  HCT 42.9 41.2 38.4*  MCV 87.9 89.6 89.3  PLT 128* 133* 135*   Cardiac Enzymes:  Recent Labs Lab 12/15/13 2200 12/16/13 0620 12/16/13 1025 12/16/13 1615  TROPONINI <0.30 <0.30 <0.30 <0.30   BNP (last 3 results)  Recent Labs  02/07/13 1025 12/15/13 2200  PROBNP 192.9* 78.6   CBG:  Recent Labs Lab 12/17/13 1225 12/17/13 1654 12/17/13 2017 12/18/13 0630 12/18/13 1139  GLUCAP 144* 100* 186* 122* 170*    Recent Results (from the past 240 hour(s))  CULTURE, BLOOD (ROUTINE X 2)     Status: None   Collection Time    12/15/13 11:05 PM      Result Value Range Status   Specimen Description  BLOOD RIGHT ANTECUBITAL   Final   Special Requests BOTTLES DRAWN AEROBIC AND ANAEROBIC 3CC   Final   Culture  Setup Time     Final   Value: 12/16/2013 03:06     Performed at [facility]   Culture     Final   Value:        BLOOD CULTURE RECEIVED NO GROWTH TO DATE CULTURE WILL BE HELD FOR 5 DAYS BEFORE ISSUING A FINAL NEGATIVE REPORT     Performed at [facility]   Report Status PENDING   Incomplete  CULTURE, BLOOD (ROUTINE X 2)     Status: None   Collection Time    12/15/13 11:10 PM      Result Value Range Status   Specimen Description BLOOD RIGHT FOREARM   Final   Special Requests BOTTLES DRAWN AEROBIC ONLY 4CC   Final   Culture  Setup Time     Final   Value: 12/16/2013 03:06     Performed at [facility]   Culture     Final   Value:        BLOOD CULTURE  RECEIVED NO GROWTH TO DATE CULTURE WILL BE HELD FOR 5 DAYS BEFORE ISSUING A FINAL NEGATIVE REPORT     Performed at [facility]   Report Status PENDING   Incomplete  RESPIRATORY VIRUS PANEL     Status: Abnormal   Collection Time    12/16/13  3:09 AM      Result Value Range Status   Source - RVPAN NOSE   Final   Respiratory Syncytial Virus A NOT DETECTED   Final   Respiratory Syncytial Virus B NOT DETECTED   Final   Influenza A DETECTED (*)  Final   Influenza B NOT DETECTED   Final   Parainfluenza 1 NOT DETECTED   Final   Parainfluenza 2 NOT DETECTED   Final   Parainfluenza 3 NOT DETECTED   Final   Metapneumovirus NOT DETECTED   Final   Rhinovirus NOT DETECTED   Final   Adenovirus NOT DETECTED   Final   Influenza A H1 NOT DETECTED   Final   Influenza A H3 NOT DETECTED   Final   Comment: (NOTE)           Normal Reference Range for each Analyte: NOT DETECTED     Testing performed using the Luminex xTAG Respiratory Viral Panel test     kit.     This test was developed and its performance characteristics determined     by [facility]. It has not been cleared or approved by the [place]     Food and Drug Administration. This test is used for clinical purposes.     It should not be regarded as investigational or for research. This     laboratory is certified under the Clinical Laboratory Improvement     Amendments of 1988 (CLIA) as qualified to perform high complexity     clinical laboratory testing.     Performed at [facility]  MRSA PCR SCREENING     Status: None   Collection Time    12/16/13  4:31 AM      Result Value Range Status   MRSA by PCR NEGATIVE  NEGATIVE Final   Comment:            The GeneXpert MRSA Assay (FDA     approved for NASAL specimens     only), is one component  of a     comprehensive MRSA colonization     surveillance program. It is not     intended to diagnose MRSA     infection nor to guide or     monitor treatment for     MRSA  infections.     Studies: No results found.  Scheduled Meds: . albuterol  2.5 mg Nebulization QID  . aspirin EC  325 mg Oral Daily  . budesonide (PULMICORT) nebulizer solution  0.25 mg Nebulization BID  . clopidogrel  75 mg Oral Daily  . enoxaparin (LOVENOX) injection  40 mg Subcutaneous Q24H  . furosemide  20 mg Oral q morning - 10a  . insulin aspart  0-9 Units Subcutaneous TID WC  . ipratropium  0.5 mg Nebulization QID  . methylPREDNISolone (SOLU-MEDROL) injection  40 mg Intravenous Q12H  . metoprolol  50 mg Oral BID  . oseltamivir  75 mg Oral BID  . pantoprazole  40 mg Oral Q1200   Continuous Infusions:   Active Problems:   CAD   Chest pain   COPD with acute exacerbation   COPD exacerbation   Fever  Time spent:  CHIU, STEPHEN K  [facility] Pager [phone]. If 7PM-7AM, please contact night-coverage at [email or web address], password [facility] 12/18/2013, 1:02 PM  LOS: 3 days

## 2013-12-18 NOTE — Evaluation (Signed)
Occupational Therapy Evaluation Patient Details Name: [name] MRN: [id number] DOB: 1951/01/04 Today's Date: 12/18/2013 Time: 2130-8657 OT Time Calculation (min): 49 min  OT Assessment / Plan / Recommendation History of present illness 62 y.o. male with history of COPD on home oxygen and chronic steroid therapy, CAD status post stenting presented to the ER because of chest pain and short of breath. Patient stated that he'd been having chronic shortness of breath but it had worsened.  The patient had also been getting chest pain off and on in the anterior chest - nonradiating pressure-like.   Clinical Impression   Pt is currently overall supervision level for selfcare tasks and functional transfers at this time.  Feel he could reach modified independent level with increased activity.  Will continue to follow for acute OT needs in order to help increase pt's independence with selfcare and ADLs as well as providing education on AE/DME as well as energy conservation strategies.  No post acute OT needs anticipated.    OT Assessment  Patient needs continued OT Services    Follow Up Recommendations  No OT follow up       Equipment Recommendations  None recommended by OT       Frequency  Min 2X/week    Precautions / Restrictions Precautions Precautions: Fall Restrictions Weight Bearing Restrictions: No   Pertinent Vitals/Pain O2 sats decreased to 92% on 4Ls nasal cannula    ADL  Eating/Feeding: Simulated;Independent Where Assessed - Eating/Feeding: Chair Grooming: Performed;Wash/dry hands;Wash/dry face;Teeth care;Supervision/safety Where Assessed - Grooming: Supported standing Upper Body Bathing: Simulated;Supervision/safety Where Assessed - Upper Body Bathing: Unsupported sitting Lower Body Bathing: Simulated;Minimal assistance Where Assessed - Lower Body Bathing: Supported sit to stand Upper Body Dressing: Simulated;Set up Where Assessed - Upper Body Dressing: Unsupported  sitting Lower Body Dressing: Simulated;Minimal assistance Where Assessed - Lower Body Dressing: Supported sit to stand Toilet Transfer: Research scientist (life sciences) Method: Surveyor, minerals: Comfort height toilet;Grab bars Toileting - Architect and Hygiene: Performed;Supervision/safety Where Assessed - Engineer, mining and Hygiene: Sit to stand from 3-in-1 or toilet Transfers/Ambulation Related to ADLs: Pt is overall supervision level for mobility in the room using the IV pole for stability. ADL Comments: Pt is overall min assist for LB selfcare secondary to not being able to reach his RLE.  Discussed and educated on AE for LB bathing and dressing.      OT Diagnosis: Generalized weakness  OT Problem List: Decreased strength;Impaired balance (sitting and/or standing);Decreased knowledge of use of DME or AE;Decreased activity tolerance OT Treatment Interventions: Self-care/ADL training;Energy conservation;Patient/family education;Therapeutic activities;DME and/or AE instruction;Balance training   OT Goals(Current goals can be found in the care plan section) Acute Rehab OT Goals Patient Stated Goal: Pt did not state this session. OT Goal Formulation: With patient Time For Goal Achievement: 01/01/14 Potential to Achieve Goals: Good  Visit Information  Last OT Received On: 12/18/13 Assistance Needed: +1 History of Present Illness: 62 y.o. male with history of COPD on home oxygen and chronic steroid therapy, CAD status post stenting presented to the ER because of chest pain and short of breath. Patient stated that he'd been having chronic shortness of breath but it had worsened.  The patient had also been getting chest pain off and on in the anterior chest - nonradiating pressure-like.       Prior Functioning     Home Living Family/patient expects to be discharged to:: Private residence Living Arrangements:  Alone;Children Available Help at Discharge: Available 24  hours/day Type of Home: Apartment Home Access: Level entry Home Layout: One level Home Equipment: Cane - single point;Other (comment) Additional Comments: Has O2 at home 2-2.5 Ls Prior Function Level of Independence: Independent Communication Communication: No difficulties         Vision/Perception Vision - History Baseline Vision: No visual deficits Patient Visual Report: No change from baseline Vision - Assessment Eye Alignment: Within Functional Limits Perception Perception: Within Functional Limits Praxis Praxis: Intact   Cognition  Cognition Arousal/Alertness: Awake/alert Behavior During Therapy: WFL for tasks assessed/performed Overall Cognitive Status: Within Functional Limits for tasks assessed    Extremity/Trunk Assessment Upper Extremity Assessment Upper Extremity Assessment: Overall WFL for tasks assessed Lower Extremity Assessment Lower Extremity Assessment: Defer to PT evaluation Cervical / Trunk Assessment Cervical / Trunk Assessment: Normal     Mobility Bed Mobility Bed Mobility: Supine to Sit Supine to Sit: 7: Independent Transfers Transfers: Sit to Stand;Stand to Sit Sit to Stand: 5: Supervision;With upper extremity assist;From bed Stand to Sit: 5: Supervision;With upper extremity assist;To bed        Balance Balance Balance Assessed: Yes Static Sitting Balance Static Sitting - Balance Support: Right upper extremity supported;Left upper extremity supported Static Sitting - Level of Assistance: 7: Independent Static Standing Balance Static Standing - Balance Support: No upper extremity supported Static Standing - Level of Assistance: 5: Stand by assistance   End of Session OT - End of Session Equipment Utilized During Treatment: Oxygen Activity Tolerance: Patient limited by fatigue Patient left: in chair;with call bell/phone within reach Nurse Communication: Mobility status      Yarethzy Croak OTR/L 12/18/2013, 11:39 AM

## 2013-12-18 NOTE — Progress Notes (Signed)
PT Cancellation Note  Patient Details Name: CARLITO BOGERT MRN: [id number] DOB: 06-Nov-1951   Cancelled Treatment:    Reason Eval/Treat Not Completed: Medical issues which prohibited therapy.  Pt in too much respiratory distress to do eval today.  Will see in am. 12/18/2013  [place] Bing, PT [phone]  (pager)   Christobal Morado, [name] 12/18/2013, 5:09 PM

## 2013-12-18 NOTE — Progress Notes (Signed)
Pharmacy notified of need for Solumedrol IV for 1600 dose. Drawers checked and none found. Spoke with pharmacist and was told to not give 1600 since next dose is scheduled at 2200. Will report findings to night shift nurse.

## 2013-12-19 DIAGNOSIS — J189 Pneumonia, unspecified organism: Secondary | ICD-10-CM

## 2013-12-19 LAB — GLUCOSE, CAPILLARY
Glucose-Capillary: 141 mg/dL — ABNORMAL HIGH (ref 70–99)
Glucose-Capillary: 117 mg/dL — ABNORMAL HIGH (ref 70–99)
Glucose-Capillary: 180 mg/dL — ABNORMAL HIGH (ref 70–99)

## 2013-12-19 MED ORDER — HYDROCODONE-HOMATROPINE 5-1.5 MG/5ML PO SYRP
5.0000 mL | ORAL_SOLUTION | ORAL | Status: DC | PRN
  Administered 2013-12-20 – 2013-12-23 (×2): 5 mL via ORAL
  Filled 2013-12-19 (×3): qty 5

## 2013-12-19 NOTE — Evaluation (Signed)
Physical Therapy Evaluation Patient Details Name: STORY VANVRANKEN MRN: [id number] DOB: October 27, 1951 Today's Date: 12/19/2013 Time: 1410-1433 PT Time Calculation (min): 23 min  PT Assessment / Plan / Recommendation History of Present Illness  62 y.o. male with history of COPD on home oxygen and chronic steroid therapy, CAD status post stenting presented to the ER because of chest pain and short of breath. Patient stated that he'd been having chronic shortness of breath but it had worsened.  The patient had also been getting chest pain off and on in the anterior chest - nonradiating pressure-like.  Clinical Impression  Pt admitted with the problems stated above.  He presents with SOB, deconditioning and general weakness, but should make steady progress and be able to get back home with available assist.  Will continue to see on acute.    PT Assessment  Patient needs continued PT services    Follow Up Recommendations  Home health PT;Supervision - Intermittent    Does the patient have the potential to tolerate intense rehabilitation      Barriers to Discharge        Equipment Recommendations  None recommended by PT    Recommendations for Other Services     Frequency Min 3X/week    Precautions / Restrictions Precautions Precautions: Fall (lower risk)   Pertinent Vitals/Pain 95% sats on 89 bpm at rest, 91% at the lowest during gait with EHR at 91/92bpm/      Mobility  Bed Mobility Bed Mobility: Supine to Sit;Sit to Supine Supine to Sit: 7: Independent Sit to Supine: 7: Independent Transfers Transfers: Sit to Stand;Stand to Sit Sit to Stand: 5: Supervision;With upper extremity assist;From bed Stand to Sit: 5: Supervision;With upper extremity assist;To bed Ambulation/Gait Ambulation/Gait Assistance: 5: Supervision Ambulation Distance (Feet): 175 Feet (pulling port. O2 with 3 standing rest breaks) Assistive device: Other (Comment) (pulling port. o2 tank) Ambulation/Gait  Assistance Details: generally steady given R leg length discrepancy Gait Pattern: Step-through pattern Gait velocity: slow Stairs: No    Exercises     PT Diagnosis: Abnormality of gait;Generalized weakness (decr. activity tolerance)  PT Problem List: Decreased strength;Decreased activity tolerance;Decreased mobility;Decreased safety awareness;Decreased knowledge of precautions;Cardiopulmonary status limiting activity PT Treatment Interventions: Gait training;Stair training;Functional mobility training;Therapeutic activities;Patient/family education     PT Goals(Current goals can be found in the care plan section) Acute Rehab PT Goals Patient Stated Goal: Pt did not state this session. PT Goal Formulation: With patient Time For Goal Achievement: 12/26/13 Potential to Achieve Goals: Good  Visit Information  Last PT Received On: 12/19/13 Assistance Needed: +1 History of Present Illness: 62 y.o. male with history of COPD on home oxygen and chronic steroid therapy, CAD status post stenting presented to the ER because of chest pain and short of breath. Patient stated that he'd been having chronic shortness of breath but it had worsened.  The patient had also been getting chest pain off and on in the anterior chest - nonradiating pressure-like.       Prior Functioning  Home Living Family/patient expects to be discharged to:: Private residence Living Arrangements: Alone (lives with a dtr who works 2 hours /day) Available Help at Discharge: Available 24 hours/day Type of Home: Apartment Home Access: Level entry Home Layout: One level Home Equipment: Cane - single point;Other (comment) Additional Comments: Has O2 at home 2-2.5 Ls Prior Function Level of Independence: Independent Communication Communication: No difficulties    Cognition  Cognition Arousal/Alertness: Awake/alert Behavior During Therapy: WFL for tasks assessed/performed Overall Cognitive  Status: Within Functional  Limits for tasks assessed    Extremity/Trunk Assessment Upper Extremity Assessment Upper Extremity Assessment: Defer to OT evaluation Lower Extremity Assessment Lower Extremity Assessment: Overall WFL for tasks assessed;Generalized weakness Cervical / Trunk Assessment Cervical / Trunk Assessment: Normal   Balance Balance Balance Assessed: Yes Static Standing Balance Static Standing - Balance Support: No upper extremity supported Static Standing - Level of Assistance: 5: Stand by assistance  End of Session PT - End of Session Equipment Utilized During Treatment: Oxygen Activity Tolerance: Patient tolerated treatment well Patient left: Other (comment);with call bell/phone within reach (sitting EOB) Nurse Communication: Mobility status  GP     Julie-Anne Torain, [name] 12/19/2013, 3:10 PM 12/19/2013  [place] Bing, PT [phone]  (pager)

## 2013-12-19 NOTE — Progress Notes (Deleted)
Physical Therapy Evaluation Patient Details Name: [name] MRN: [id number] DOB: [birth year] Today's Date: 12/19/2013 Time: 1410-1433 PT Time Calculation (min): 23 min  PT Assessment / Plan / Recommendation History of Present Illness  62 y.o. male with history of COPD on home oxygen and chronic steroid therapy, CAD status post stenting presented to the ER because of chest pain and short of breath. Patient stated that he'd been having chronic shortness of breath but it had worsened.  The patient had also been getting chest pain off and on in the anterior chest - nonradiating pressure-like.  Clinical Impression  Pt admitted with the above problems, but is improving steadily at this point though continues in a deconditioned state.  Pt should be able to D/C home safely with available family assist.  Will see on acute until D/C.    PT Assessment  Patient needs continued PT services    Follow Up Recommendations  Home health PT;Supervision - Intermittent    Does the patient have the potential to tolerate intense rehabilitation      Barriers to Discharge        Equipment Recommendations  None recommended by PT    Recommendations for Other Services     Frequency Min 3X/week    Precautions / Restrictions Precautions Precautions: Fall (lower risk)   Pertinent Vitals/Pain Initially at rest 95% sats on 3L, HR 89%, During ambulation sats decreased to 91% at the lowest, EHR 92 bpm      Mobility  Bed Mobility Bed Mobility: Supine to Sit;Sit to Supine Supine to Sit: 7: Independent Sit to Supine: 7: Independent Transfers Transfers: Sit to Stand;Stand to Sit Sit to Stand: 5: Supervision;With upper extremity assist;From bed Stand to Sit: 5: Supervision;With upper extremity assist;To bed Ambulation/Gait Ambulation/Gait Assistance: 5: Supervision Ambulation Distance (Feet): 175 Feet (pulling port. O2 with 3 standing rest breaks) Assistive device: Other (Comment) (pulling port. o2  tank) Ambulation/Gait Assistance Details: generally steady given R leg length discrepancy Gait Pattern: Step-through pattern Gait velocity: slow Stairs: No    Exercises     PT Diagnosis: Abnormality of gait;Generalized weakness (decr. activity tolerance)  PT Problem List: Decreased strength;Decreased activity tolerance;Decreased mobility;Decreased safety awareness;Decreased knowledge of precautions;Cardiopulmonary status limiting activity PT Treatment Interventions: Gait training;Stair training;Functional mobility training;Therapeutic activities;Patient/family education     PT Goals(Current goals can be found in the care plan section) Acute Rehab PT Goals Patient Stated Goal: Pt did not state this session. PT Goal Formulation: With patient Time For Goal Achievement: 12/26/13 Potential to Achieve Goals: Good  Visit Information  Last PT Received On: 12/19/13 Assistance Needed: +1 History of Present Illness: 62 y.o. male with history of COPD on home oxygen and chronic steroid therapy, CAD status post stenting presented to the ER because of chest pain and short of breath. Patient stated that he'd been having chronic shortness of breath but it had worsened.  The patient had also been getting chest pain off and on in the anterior chest - nonradiating pressure-like.       Prior Functioning  Home Living Family/patient expects to be discharged to:: Private residence Living Arrangements: Alone (lives with a dtr who works 2 hours /day) Available Help at Discharge: Available 24 hours/day Type of Home: Apartment Home Access: Level entry Home Layout: One level Home Equipment: Cane - single point;Other (comment) Additional Comments: Has O2 at home 2-2.5 Ls Prior Function Level of Independence: Independent Communication Communication: No difficulties    Cognition  Cognition Arousal/Alertness: Awake/alert Behavior During Therapy: [facility]  for tasks assessed/performed Overall Cognitive Status:  Within Functional Limits for tasks assessed    Extremity/Trunk Assessment Upper Extremity Assessment Upper Extremity Assessment: Defer to OT evaluation Lower Extremity Assessment Lower Extremity Assessment: Overall WFL for tasks assessed;Generalized weakness Cervical / Trunk Assessment Cervical / Trunk Assessment: Normal   Balance Balance Balance Assessed: Yes Static Standing Balance Static Standing - Balance Support: No upper extremity supported Static Standing - Level of Assistance: 5: Stand by assistance  End of Session PT - End of Session Equipment Utilized During Treatment: Oxygen Activity Tolerance: Patient tolerated treatment well Patient left: Other (comment);with call bell/phone within reach (sitting EOB) Nurse Communication: Mobility status  GP     [name], [name] 12/19/2013, 3:17 PM  12/19/2013  Cable Bing, PT [phone]  (pager)

## 2013-12-19 NOTE — Progress Notes (Signed)
[facility] PROGRESS NOTE  [name] ZOX:[id number] DOB: 1951-07-26 DOA: 12/15/2013 PCP: No PCP Per Patient  Assessment/Plan: Acute bronchospastic exacerbation COPD  Continuing to improve with better breath sounds but not yet at baseline  Influenza screen POSITIVE Influenza Started tamiflu Hx of CAD s/p stenting w/ acute CP  Trop negative x3 - cardiology not impressed and symptoms are suggestive of angina - plan is for outpatient stress testing once pulmonary status is stable  HTN  Blood pressure is currently well controlled  Tobacco abuse  Counseled on the absolute need to discontinue smoking - patient states that he has  Hyperglycemia  A1c does not meet criteria for true diabetes - continue sliding scale insulin while on steroids  Code Status: Full Family Communication: Pt in room (indicate person spoken with, relationship, and if by phone, the number) Disposition Plan: Pending   Consultants:  Cardiology  Antibiotics: Rocephin 12/28 >> 12/30 Azithro 12/28 >>12/30 Tamiflu 12/30>>>  HPI/Subjective: No acute events noted overnight. Still wheezing, improving slowly  Objective: Filed Vitals:   12/18/13 1953 12/18/13 2012 12/18/13 2218 12/19/13 0357  BP: 133/85  138/74 127/83  Pulse: 88  98 77  Temp: 98.1 F (36.7 C)   98 F (36.7 C)  TempSrc: Oral   Oral  Resp: 20   22  Height:      Weight:    91.5 kg (201 lb 11.5 oz)  SpO2: 99% 99%  97%    Intake/Output Summary (Last 24 hours) at 12/19/13 0732 Last data filed at 12/18/13 1953  Gross per 24 hour  Intake    720 ml  Output    600 ml  Net    120 ml   Filed Weights   12/16/13 0206 12/16/13 0416 12/19/13 0357  Weight: 92.987 kg (205 lb) 92.4 kg (203 lb 11.3 oz) 91.5 kg (201 lb 11.5 oz)    Exam:   General:  Awake, in nad  Cardiovascular: regular, s1, s2  Respiratory: decreased BS, wheezing throughout  Abdomen: soft, nondistended  Musculoskeletal: perfused, no clubbing   Data  Reviewed: Basic Metabolic Panel:  Recent Labs Lab 12/15/13 2200 12/16/13 0620 12/17/13 0447  NA 140 137 137  K 4.1 4.2 4.2  CL 102 100 101  CO2 31 24 29   GLUCOSE 104* 251* 178*  BUN 14 14 18   CREATININE 1.05 0.87 0.74  CALCIUM 9.0 8.3* 8.2*   Liver Function Tests:  Recent Labs Lab 12/16/13 0620 12/17/13 0447  AST 16 16  ALT 21 19  ALKPHOS 51 45  BILITOT 0.2* 0.2*  PROT 5.9* 5.4*  ALBUMIN 3.1* 2.9*   No results found for this basename: LIPASE, AMYLASE,  in the last 168 hours No results found for this basename: AMMONIA,  in the last 168 hours CBC:  Recent Labs Lab 12/15/13 2200 12/16/13 0620 12/17/13 0447  WBC 6.7 6.8 7.8  NEUTROABS  --  6.3  --   HGB 14.3 13.6 12.8*  HCT 42.9 41.2 38.4*  MCV 87.9 89.6 89.3  PLT 128* 133* 135*   Cardiac Enzymes:  Recent Labs Lab 12/15/13 2200 12/16/13 0620 12/16/13 1025 12/16/13 1615  TROPONINI <0.30 <0.30 <0.30 <0.30   BNP (last 3 results)  Recent Labs  02/07/13 1025 12/15/13 2200  PROBNP 192.9* 78.6   CBG:  Recent Labs Lab 12/18/13 0630 12/18/13 1139 12/18/13 1621 12/18/13 2144 12/19/13 0652  GLUCAP 122* 170* 103* 85 141*    Recent Results (from the past 240 hour(s))  CULTURE, BLOOD (ROUTINE X 2)  Status: None   Collection Time    12/15/13 11:05 PM      Result Value Range Status   Specimen Description BLOOD RIGHT ANTECUBITAL   Final   Special Requests BOTTLES DRAWN AEROBIC AND ANAEROBIC 3CC   Final   Culture  Setup Time     Final   Value: 12/16/2013 03:06     Performed at [facility]   Culture     Final   Value:        BLOOD CULTURE RECEIVED NO GROWTH TO DATE CULTURE WILL BE HELD FOR 5 DAYS BEFORE ISSUING A FINAL NEGATIVE REPORT     Performed at [facility]   Report Status PENDING   Incomplete  CULTURE, BLOOD (ROUTINE X 2)     Status: None   Collection Time    12/15/13 11:10 PM      Result Value Range Status   Specimen Description BLOOD RIGHT FOREARM   Final   Special  Requests BOTTLES DRAWN AEROBIC ONLY 4CC   Final   Culture  Setup Time     Final   Value: 12/16/2013 03:06     Performed at [facility]   Culture     Final   Value:        BLOOD CULTURE RECEIVED NO GROWTH TO DATE CULTURE WILL BE HELD FOR 5 DAYS BEFORE ISSUING A FINAL NEGATIVE REPORT     Performed at [facility]   Report Status PENDING   Incomplete  RESPIRATORY VIRUS PANEL     Status: Abnormal   Collection Time    12/16/13  3:09 AM      Result Value Range Status   Source - RVPAN NOSE   Final   Respiratory Syncytial Virus A NOT DETECTED   Final   Respiratory Syncytial Virus B NOT DETECTED   Final   Influenza A DETECTED (*)  Final   Influenza B NOT DETECTED   Final   Parainfluenza 1 NOT DETECTED   Final   Parainfluenza 2 NOT DETECTED   Final   Parainfluenza 3 NOT DETECTED   Final   Metapneumovirus NOT DETECTED   Final   Rhinovirus NOT DETECTED   Final   Adenovirus NOT DETECTED   Final   Influenza A H1 NOT DETECTED   Final   Influenza A H3 NOT DETECTED   Final   Comment: (NOTE)           Normal Reference Range for each Analyte: NOT DETECTED     Testing performed using the Luminex xTAG Respiratory Viral Panel test     kit.     This test was developed and its performance characteristics determined     by [facility]. It has not been cleared or approved by the [place]     Food and Drug Administration. This test is used for clinical purposes.     It should not be regarded as investigational or for research. This     laboratory is certified under the Clinical Laboratory Improvement     Amendments of 1988 (CLIA) as qualified to perform high complexity     clinical laboratory testing.     Performed at [facility]  MRSA PCR SCREENING     Status: None   Collection Time    12/16/13  4:31 AM      Result Value Range Status   MRSA by PCR NEGATIVE  NEGATIVE Final   Comment:  The GeneXpert MRSA Assay (FDA     approved for NASAL specimens     only), is  one component of a     comprehensive MRSA colonization     surveillance program. It is not     intended to diagnose MRSA     infection nor to guide or     monitor treatment for     MRSA infections.  RESPIRATORY VIRUS PANEL     Status: Abnormal   Collection Time    12/17/13  8:40 PM      Result Value Range Status   Source - RVPAN NASAL SWAB   Corrected   Comment: CORRECTED ON 12/30 AT 1827: PREVIOUSLY REPORTED AS NASAL SWAB   Respiratory Syncytial Virus A NOT DETECTED   Final   Respiratory Syncytial Virus B NOT DETECTED   Final   Influenza A DETECTED (*)  Final   Influenza B NOT DETECTED   Final   Parainfluenza 1 NOT DETECTED   Final   Parainfluenza 2 NOT DETECTED   Final   Parainfluenza 3 NOT DETECTED   Final   Metapneumovirus NOT DETECTED   Final   Rhinovirus NOT DETECTED   Final   Adenovirus NOT DETECTED   Final   Influenza A H1 NOT DETECTED   Final   Influenza A H3 DETECTED (*)  Final   Comment: (NOTE)           Normal Reference Range for each Analyte: NOT DETECTED     Testing performed using the Luminex xTAG Respiratory Viral Panel test     kit.     This test was developed and its performance characteristics determined     by [facility]. It has not been cleared or approved by the [place]     Food and Drug Administration. This test is used for clinical purposes.     It should not be regarded as investigational or for research. This     laboratory is certified under the Clinical Laboratory Improvement     Amendments of 1988 (CLIA) as qualified to perform high complexity     clinical laboratory testing.     Performed at [facility]     Studies: No results found.  Scheduled Meds: . aspirin EC  325 mg Oral Daily  . budesonide (PULMICORT) nebulizer solution  0.25 mg Nebulization BID  . clopidogrel  75 mg Oral Daily  . enoxaparin (LOVENOX) injection  40 mg Subcutaneous Q24H  . furosemide  20 mg Oral q morning - 10a  . insulin aspart  0-9 Units Subcutaneous  TID WC  . ipratropium-albuterol  3 mL Nebulization Q4H  . methylPREDNISolone (SOLU-MEDROL) injection  60 mg Intravenous Q6H  . metoprolol  50 mg Oral BID  . oseltamivir  75 mg Oral BID  . pantoprazole  40 mg Oral Q1200   Continuous Infusions:   Active Problems:   CAD   Chest pain   COPD with acute exacerbation   COPD exacerbation   Fever  Time spent:  CHIU, STEPHEN K  [facility] Pager [phone]. If 7PM-7AM, please contact night-coverage at [email or web address], password [facility] 12/19/2013, 7:32 AM  LOS: 4 days

## 2013-12-20 LAB — GLUCOSE, CAPILLARY
GLUCOSE-CAPILLARY: 125 mg/dL — AB (ref 70–99)
Glucose-Capillary: 84 mg/dL (ref 70–99)
Glucose-Capillary: 166 mg/dL — ABNORMAL HIGH (ref 70–99)
Glucose-Capillary: 191 mg/dL — ABNORMAL HIGH (ref 70–99)

## 2013-12-20 NOTE — Progress Notes (Signed)
[facility] PROGRESS NOTE  [name] FFM:[id number] DOB: 11/01/1951 DOA: 12/15/2013 PCP: No PCP Per Patient  Assessment/Plan: Acute bronchospastic exacerbation COPD  Continuing to slowly improve with better breath sounds but not yet at baseline  Influenza screen POSITIVE Remains O2 dependent in the hospital (not o2 dependent at home) Influenza Started tamiflu Hx of CAD s/p stenting w/ acute CP  Trop negative x3 - cardiology not impressed and symptoms are suggestive of angina - plan is for outpatient stress testing once pulmonary status is stable  HTN  Blood pressure is currently well controlled  Tobacco abuse  Counseled on the absolute need to discontinue smoking - patient states that he has  Hyperglycemia  A1c does not meet criteria for true diabetes - continue sliding scale insulin while on steroids  Code Status: Full Family Communication: Pt in room (indicate person spoken with, relationship, and if by phone, the number) Disposition Plan: Pending   Consultants:  Cardiology  Antibiotics: Rocephin 12/28 >> 12/30 Azithro 12/28 >>12/30 Tamiflu 12/30>>>  HPI/Subjective: No acute issues overnight. Continuing to gradually feel better.  Objective: Filed Vitals:   12/19/13 2020 12/19/13 2221 12/20/13 0402 12/20/13 0847  BP:  162/82 131/70   Pulse:  96 65 80  Temp:  97.9 F (36.6 C) 97.5 F (36.4 C)   TempSrc:  Oral Oral   Resp:  $Remo'18 20 18  'yiNaV$ Height:      Weight:   90.81 kg (200 lb 3.2 oz)   SpO2: 95% 93% 93%     Intake/Output Summary (Last 24 hours) at 12/20/13 0924 Last data filed at 12/19/13 1819  Gross per 24 hour  Intake    720 ml  Output      4 ml  Net    716 ml   Filed Weights   12/16/13 0416 12/19/13 0357 12/20/13 0402  Weight: 92.4 kg (203 lb 11.3 oz) 91.5 kg (201 lb 11.5 oz) 90.81 kg (200 lb 3.2 oz)    Exam:   General:  Awake, in nad  Cardiovascular: regular, s1, s2  Respiratory: decreased BS, wheezing throughout  Abdomen: soft,  nondistended  Musculoskeletal: perfused, no clubbing   Data Reviewed: Basic Metabolic Panel:  Recent Labs Lab 12/15/13 2200 12/16/13 0620 12/17/13 0447  NA 140 137 137  K 4.1 4.2 4.2  CL 102 100 101  CO2 $Re'31 24 29  'qwG$ GLUCOSE 104* 251* 178*  BUN $Re'14 14 18  'mWR$ CREATININE 1.05 0.87 0.74  CALCIUM 9.0 8.3* 8.2*   Liver Function Tests:  Recent Labs Lab 12/16/13 0620 12/17/13 0447  AST 16 16  ALT 21 19  ALKPHOS 51 45  BILITOT 0.2* 0.2*  PROT 5.9* 5.4*  ALBUMIN 3.1* 2.9*   No results found for this basename: LIPASE, AMYLASE,  in the last 168 hours No results found for this basename: AMMONIA,  in the last 168 hours CBC:  Recent Labs Lab 12/15/13 2200 12/16/13 0620 12/17/13 0447  WBC 6.7 6.8 7.8  NEUTROABS  --  6.3  --   HGB 14.3 13.6 12.8*  HCT 42.9 41.2 38.4*  MCV 87.9 89.6 89.3  PLT 128* 133* 135*   Cardiac Enzymes:  Recent Labs Lab 12/15/13 2200 12/16/13 0620 12/16/13 1025 12/16/13 1615  TROPONINI <0.30 <0.30 <0.30 <0.30   BNP (last 3 results)  Recent Labs  02/07/13 1025 12/15/13 2200  PROBNP 192.9* 78.6   CBG:  Recent Labs Lab 12/19/13 0652 12/19/13 1137 12/19/13 1632 12/19/13 2217 12/20/13 0630  GLUCAP 141* 105* 117* 180* 125*  Recent Results (from the past 240 hour(s))  CULTURE, BLOOD (ROUTINE X 2)     Status: None   Collection Time    12/15/13 11:05 PM      Result Value Range Status   Specimen Description BLOOD RIGHT ANTECUBITAL   Final   Special Requests BOTTLES DRAWN AEROBIC AND ANAEROBIC 3CC   Final   Culture  Setup Time     Final   Value: 12/16/2013 03:06     Performed at [facility]   Culture     Final   Value:        BLOOD CULTURE RECEIVED NO GROWTH TO DATE CULTURE WILL BE HELD FOR 5 DAYS BEFORE ISSUING A FINAL NEGATIVE REPORT     Performed at [facility]   Report Status PENDING   Incomplete  CULTURE, BLOOD (ROUTINE X 2)     Status: None   Collection Time    12/15/13 11:10 PM      Result Value Range  Status   Specimen Description BLOOD RIGHT FOREARM   Final   Special Requests BOTTLES DRAWN AEROBIC ONLY 4CC   Final   Culture  Setup Time     Final   Value: 12/16/2013 03:06     Performed at [facility]   Culture     Final   Value:        BLOOD CULTURE RECEIVED NO GROWTH TO DATE CULTURE WILL BE HELD FOR 5 DAYS BEFORE ISSUING A FINAL NEGATIVE REPORT     Performed at [facility]   Report Status PENDING   Incomplete  RESPIRATORY VIRUS PANEL     Status: Abnormal   Collection Time    12/16/13  3:09 AM      Result Value Range Status   Source - RVPAN NOSE   Final   Respiratory Syncytial Virus A NOT DETECTED   Final   Respiratory Syncytial Virus B NOT DETECTED   Final   Influenza A DETECTED (*)  Final   Influenza B NOT DETECTED   Final   Parainfluenza 1 NOT DETECTED   Final   Parainfluenza 2 NOT DETECTED   Final   Parainfluenza 3 NOT DETECTED   Final   Metapneumovirus NOT DETECTED   Final   Rhinovirus NOT DETECTED   Final   Adenovirus NOT DETECTED   Final   Influenza A H1 NOT DETECTED   Final   Influenza A H3 NOT DETECTED   Final   Comment: (NOTE)           Normal Reference Range for each Analyte: NOT DETECTED     Testing performed using the Luminex xTAG Respiratory Viral Panel test     kit.     This test was developed and its performance characteristics determined     by [facility]. It has not been cleared or approved by the [place]     Food and Drug Administration. This test is used for clinical purposes.     It should not be regarded as investigational or for research. This     laboratory is certified under the [place] (CLIA) as qualified to perform high complexity     clinical laboratory testing.     Performed at [facility] PCR SCREENING     Status: None   Collection Time    12/16/13  4:31 AM      Result Value Range Status  MRSA by PCR NEGATIVE  NEGATIVE Final   Comment:            The  GeneXpert MRSA Assay (FDA     approved for NASAL specimens     only), is one component of a     comprehensive MRSA colonization     surveillance program. It is not     intended to diagnose MRSA     infection nor to guide or     monitor treatment for     MRSA infections.  RESPIRATORY VIRUS PANEL     Status: Abnormal   Collection Time    12/17/13  8:40 PM      Result Value Range Status   Source - RVPAN NASAL SWAB   Corrected   Comment: CORRECTED ON 12/30 AT 1827: PREVIOUSLY REPORTED AS NASAL SWAB   Respiratory Syncytial Virus A NOT DETECTED   Final   Respiratory Syncytial Virus B NOT DETECTED   Final   Influenza A DETECTED (*)  Final   Influenza B NOT DETECTED   Final   Parainfluenza 1 NOT DETECTED   Final   Parainfluenza 2 NOT DETECTED   Final   Parainfluenza 3 NOT DETECTED   Final   Metapneumovirus NOT DETECTED   Final   Rhinovirus NOT DETECTED   Final   Adenovirus NOT DETECTED   Final   Influenza A H1 NOT DETECTED   Final   Influenza A H3 DETECTED (*)  Final   Comment: (NOTE)           Normal Reference Range for each Analyte: NOT DETECTED     Testing performed using the Luminex xTAG Respiratory Viral Panel test     kit.     This test was developed and its performance characteristics determined     by [facility]. It has not been cleared or approved by the [place]     Food and Drug Administration. This test is used for clinical purposes.     It should not be regarded as investigational or for research. This     laboratory is certified under the [place] (CLIA) as qualified to perform high complexity     clinical laboratory testing.     Performed at [facility]     Studies: No results found.  Scheduled Meds: . aspirin EC  325 mg Oral Daily  . budesonide (PULMICORT) nebulizer solution  0.25 mg Nebulization BID  . clopidogrel  75 mg Oral Daily  . enoxaparin (LOVENOX) injection  40 mg Subcutaneous Q24H  .  furosemide  20 mg Oral q morning - 10a  . insulin aspart  0-9 Units Subcutaneous TID WC  . ipratropium-albuterol  3 mL Nebulization Q4H  . methylPREDNISolone (SOLU-MEDROL) injection  60 mg Intravenous Q6H  . metoprolol  50 mg Oral BID  . oseltamivir  75 mg Oral BID  . pantoprazole  40 mg Oral Q1200   Continuous Infusions:   Active Problems:   CAD   Chest pain   COPD with acute exacerbation   COPD exacerbation   Fever  Time spent: 18min  Ottis Vacha, [facility] Pager [phone]. If 7PM-7AM, please contact night-coverage at [email or web address], password [facility] 12/20/2013, 9:24 AM  LOS: 5 days

## 2013-12-20 NOTE — Progress Notes (Signed)
Physical Therapy Treatment Patient Details Name: Chris ProphetUlysses G Kleman MRN: [id number] DOB: 01/03/1951 Today's Date: 12/20/2013 Time: 1300-1316 PT Time Calculation (min): 16 min  PT Assessment / Plan / Recommendation  History of Present Illness 63 y.o. male with history of COPD on home oxygen and chronic steroid therapy, CAD status post stenting presented to the ER because of chest pain and short of breath. Patient stated that he'd been having chronic shortness of breath but it had worsened.  The patient had also been getting chest pain off and on in the anterior chest - nonradiating pressure-like.   PT Comments   Patient doing well with overall mobility and ambulation. No unsteadiness noted. Attempted to go to complete stair training, however patient had to return to room due to SOB. Patient on 3L and remained 88-99% throughout session. Will need to attempt stair training next session. Encouraged patient to ambulate as tolerated  Follow Up Recommendations  Home health PT;Supervision - Intermittent     Does the patient have the potential to tolerate intense rehabilitation     Barriers to Discharge        Equipment Recommendations  None recommended by PT    Recommendations for Other Services    Frequency Min 3X/week   Progress towards PT Goals Progress towards PT goals: Progressing toward goals  Plan Current plan remains appropriate    Precautions / Restrictions Precautions Precautions: Fall   Pertinent Vitals/Pain Increased SOB. 4/4 dyspnea    Mobility  Bed Mobility Supine to Sit: 7: Independent Transfers Sit to Stand: 6: Modified independent (Device/Increase time) Stand to Sit: 6: Modified independent (Device/Increase time) Ambulation/Gait Ambulation/Gait Assistance: 6: Modified independent (Device/Increase time) Ambulation Distance (Feet): 200 Feet Gait Pattern: Step-through pattern    Exercises     PT Diagnosis:    PT Problem List:   PT Treatment Interventions:     PT  Goals (current goals can now be found in the care plan section)    Visit Information  Last PT Received On: 12/20/13 Assistance Needed: +1 History of Present Illness: 63 y.o. male with history of COPD on home oxygen and chronic steroid therapy, CAD status post stenting presented to the ER because of chest pain and short of breath. Patient stated that he'd been having chronic shortness of breath but it had worsened.  The patient had also been getting chest pain off and on in the anterior chest - nonradiating pressure-like.    Subjective Data      Cognition  Cognition Arousal/Alertness: Awake/alert Behavior During Therapy: WFL for tasks assessed/performed Overall Cognitive Status: Within Functional Limits for tasks assessed    Balance     End of Session PT - End of Session Equipment Utilized During Treatment: Oxygen Activity Tolerance: Patient tolerated treatment well Patient left: in bed;with call bell/phone within reach Nurse Communication: Mobility status   GP     Fredrich BirksRobinette, Aleene Swanner Elizabeth 12/20/2013, 1:27 PM  12/20/2013 Fredrich Birksobinette, Jay Kempe Elizabeth PTA [phone] pager [phone] office

## 2013-12-21 DIAGNOSIS — I10 Essential (primary) hypertension: Secondary | ICD-10-CM

## 2013-12-21 DIAGNOSIS — J101 Influenza due to other identified influenza virus with other respiratory manifestations: Secondary | ICD-10-CM | POA: Diagnosis present

## 2013-12-21 DIAGNOSIS — R079 Chest pain, unspecified: Secondary | ICD-10-CM

## 2013-12-21 DIAGNOSIS — J441 Chronic obstructive pulmonary disease with (acute) exacerbation: Principal | ICD-10-CM

## 2013-12-21 LAB — GLUCOSE, CAPILLARY
GLUCOSE-CAPILLARY: 127 mg/dL — AB (ref 70–99)
Glucose-Capillary: 113 mg/dL — ABNORMAL HIGH (ref 70–99)
Glucose-Capillary: 182 mg/dL — ABNORMAL HIGH (ref 70–99)
Glucose-Capillary: 164 mg/dL — ABNORMAL HIGH (ref 70–99)

## 2013-12-21 MED ORDER — LEVOFLOXACIN IN D5W 500 MG/100ML IV SOLN
500.0000 mg | Freq: Every day | INTRAVENOUS | Status: DC
  Administered 2013-12-21 – 2013-12-23 (×3): 500 mg via INTRAVENOUS
  Filled 2013-12-21 (×3): qty 100

## 2013-12-21 MED ORDER — GUAIFENESIN ER 600 MG PO TB12
1200.0000 mg | ORAL_TABLET | Freq: Two times a day (BID) | ORAL | Status: DC
  Administered 2013-12-21 – 2013-12-25 (×9): 1200 mg via ORAL
  Filled 2013-12-21 (×10): qty 2

## 2013-12-21 MED ORDER — METHYLPREDNISOLONE SODIUM SUCC 125 MG IJ SOLR
60.0000 mg | Freq: Three times a day (TID) | INTRAMUSCULAR | Status: DC
  Administered 2013-12-21 – 2013-12-22 (×3): 60 mg via INTRAVENOUS
  Filled 2013-12-21 (×5): qty 0.96

## 2013-12-21 NOTE — Progress Notes (Signed)
Occupational Therapy Treatment Patient Details Name: [name] MRN: [id number] DOB: 05/06/1951 Today's Date: 12/21/2013 Time: 1105-1150 OT Time Calculation (min): 45 min  OT Assessment / Plan / Recommendation  History of present illness 63 y.o. male with history of COPD on home oxygen and chronic steroid therapy, CAD status post stenting presented to the ER because of chest pain and short of breath. Patient stated that he'd been having chronic shortness of breath but it had worsened.  The patient had also been getting chest pain off and on in the anterior chest - nonradiating pressure-like.   OT comments  Patient seen for BADL retraining and energy conservation.  Provided AE to improve safety and independence with LB bath and dressing as well as other BADLs & IADLs.  Patient demonstrated independence using these items and performed hair washing and grooming tasks in sitting and standing.  Reviewed Energy Conservation Strategies handout and patient verbalized and demonstrated understanding.  OT signing off.  Follow Up Recommendations  No OT follow up    Equipment Recommendations  None recommended by OT    Progress towards OT Goals Progress towards OT goals: Goals met/education completed, patient discharged from [place] All goals met and education completed, patient discharged from OT services    Precautions / Restrictions Precautions Precautions: Fall Precaution Comments: limited by SOB and O2 tubing Restrictions Weight Bearing Restrictions: No   Pertinent Vitals/Pain No c/o pain except occasional chronic hip pain    ADL  Eating/Feeding: Independent Where Assessed - Eating/Feeding: Edge of bed Grooming: Performed;Set up Where Assessed - Grooming: Supported sitting;Supported standing Lower Body Bathing: Modified independent (LH sponge) Where Assessed - Lower Body Bathing: Supported sitting;Supported standing Where Assessed - Upper Body Dressing: Supported sitting Lower Body  Dressing: Modified independent (with AE) Where Assessed - Lower Body Dressing: Supported standing;Supported sitting Transfers/Ambulation Related to ADLs: Pt is overall mod I level for mobility in the room to include managing his O2 tubing. ADL Comments: Patient now Mod I with LB b/dsg with AE provided and modified techniques.  Provided patient with [facility] Strategies handout out and reviewed.  Patient reports he will read closely when his eyeglasses arrive.     OT Goals(current goals can now be found in the care plan section) Acute Rehab OT Goals Patient Stated Goal: go home when doctor says I'm ready  Visit Information  Last OT Received On: 12/21/13 Assistance Needed: +1 History of Present Illness: 63 y.o. male with history of COPD on home oxygen and chronic steroid therapy, CAD status post stenting presented to the ER because of chest pain and short of breath. Patient stated that he'd been having chronic shortness of breath but it had worsened.  The patient had also been getting chest pain off and on in the anterior chest - nonradiating pressure-like.    Cognition  Cognition Arousal/Alertness: Awake/alert Behavior During Therapy: WFL for tasks assessed/performed Overall Cognitive Status: Within Functional Limits for tasks assessed    Mobility  Bed Mobility Supine to Sit: 7: Independent Transfers Sit to Stand: 6: Modified independent (Device/Increase time) Stand to Sit: 6: Modified independent (Device/Increase time)    Balance Balance Balance Assessed: Yes Static Standing Balance Static Standing - Balance Support: No upper extremity supported Static Standing - Level of Assistance: 6: Modified independent (Device/Increase time) High Level Balance High Level Balance Activites: Direction changes;Turns;Side stepping   End of Session OT - End of Session Equipment Utilized During Treatment: Oxygen Activity Tolerance: Patient tolerated treatment well;Patient limited by  fatigue Patient left: in bed;with call bell/phone within reach (RT came in room as this OT was leaving)  [place], [place] 12/21/2013, 12:17 PM

## 2013-12-21 NOTE — Progress Notes (Signed)
Pt has very labored breathing and is awaiting txs every time they are due.

## 2013-12-21 NOTE — Progress Notes (Signed)
[facility] PROGRESS NOTE  [name] WUJ:[id number] DOB: 1951/04/29 DOA: 12/15/2013 PCP: No PCP Per Patient  Assessment/Plan: Acute bronchospastic exacerbation COPD  Continuing to slowly improve with better breath sounds but not yet at baseline  Influenza screen POSITIVE Remains O2 dependent in the hospital (not o2 dependent at home). Will start IV Levaquin. Continue oxygen, nebs, mucinex, tamiflu. Decrease IV Solumedrol to q8. Influenza Continue tamiflu Hx of CAD s/p stenting w/ acute CP  Trop negative x3 - cardiology not impressed and symptoms are suggestive of angina - plan is for outpatient stress testing once pulmonary status is stable  HTN  Blood pressure is currently well controlled  Tobacco abuse  Counseled on the absolute need to discontinue smoking - patient states that he has  Hyperglycemia  continue sliding scale insulin while on steroids  Code Status: Full Family Communication: Pt in room (indicate person spoken with, relationship, and if by phone, the number) Disposition Plan: Pending   Consultants:  Cardiology  Antibiotics: Rocephin 12/28 >> 12/30 Azithro 12/28 >>12/30 Tamiflu 12/30>>>  HPI/Subjective: No acute issues overnight. Continuing to gradually feel better.  Objective: Filed Vitals:   12/21/13 0442 12/21/13 1147 12/21/13 1408 12/21/13 1623  BP: 142/75  143/77   Pulse: 67  69   Temp: 97.6 F (36.4 C)  97.2 F (36.2 C)   TempSrc: Oral  Oral   Resp: 21  20   Height:      Weight: 91.899 kg (202 lb 9.6 oz)     SpO2: 98% 96% 97% 97%    Intake/Output Summary (Last 24 hours) at 12/21/13 1734 Last data filed at 12/21/13 1658  Gross per 24 hour  Intake   1200 ml  Output      0 ml  Net   1200 ml   Filed Weights   12/19/13 0357 12/20/13 0402 12/21/13 0442  Weight: 91.5 kg (201 lb 11.5 oz) 90.81 kg (200 lb 3.2 oz) 91.899 kg (202 lb 9.6 oz)    Exam:   General:  Awake, in nad  Cardiovascular: regular, s1, s2  Respiratory:  decreased BS, min wheezing. Difuse rhonchi  Abdomen: soft, nondistended  Musculoskeletal: perfused, no clubbing   Data Reviewed: Basic Metabolic Panel:  Recent Labs Lab 12/15/13 2200 12/16/13 0620 12/17/13 0447  NA 140 137 137  K 4.1 4.2 4.2  CL 102 100 101  CO2 $Re'31 24 29  'jVs$ GLUCOSE 104* 251* 178*  BUN $Re'14 14 18  'xzx$ CREATININE 1.05 0.87 0.74  CALCIUM 9.0 8.3* 8.2*   Liver Function Tests:  Recent Labs Lab 12/16/13 0620 12/17/13 0447  AST 16 16  ALT 21 19  ALKPHOS 51 45  BILITOT 0.2* 0.2*  PROT 5.9* 5.4*  ALBUMIN 3.1* 2.9*   No results found for this basename: LIPASE, AMYLASE,  in the last 168 hours No results found for this basename: AMMONIA,  in the last 168 hours CBC:  Recent Labs Lab 12/15/13 2200 12/16/13 0620 12/17/13 0447  WBC 6.7 6.8 7.8  NEUTROABS  --  6.3  --   HGB 14.3 13.6 12.8*  HCT 42.9 41.2 38.4*  MCV 87.9 89.6 89.3  PLT 128* 133* 135*   Cardiac Enzymes:  Recent Labs Lab 12/15/13 2200 12/16/13 0620 12/16/13 1025 12/16/13 1615  TROPONINI <0.30 <0.30 <0.30 <0.30   BNP (last 3 results)  Recent Labs  02/07/13 1025 12/15/13 2200  PROBNP 192.9* 78.6   CBG:  Recent Labs Lab 12/20/13 1704 12/20/13 2103 12/21/13 0624 12/21/13 1113 12/21/13 [place]  166* 191* 113* 127* 182*    Recent Results (from the past 240 hour(s))  CULTURE, BLOOD (ROUTINE X 2)     Status: None   Collection Time    12/15/13 11:05 PM      Result Value Range Status   Specimen Description BLOOD RIGHT ANTECUBITAL   Final   Special Requests BOTTLES DRAWN AEROBIC AND ANAEROBIC 3CC   Final   Culture  Setup Time     Final   Value: 12/16/2013 03:06     Performed at [facility]   Culture     Final   Value:        BLOOD CULTURE RECEIVED NO GROWTH TO DATE CULTURE WILL BE HELD FOR 5 DAYS BEFORE ISSUING A FINAL NEGATIVE REPORT     Performed at [facility]   Report Status PENDING   Incomplete  CULTURE, BLOOD (ROUTINE X 2)     Status: None    Collection Time    12/15/13 11:10 PM      Result Value Range Status   Specimen Description BLOOD RIGHT FOREARM   Final   Special Requests BOTTLES DRAWN AEROBIC ONLY 4CC   Final   Culture  Setup Time     Final   Value: 12/16/2013 03:06     Performed at [facility]   Culture     Final   Value:        BLOOD CULTURE RECEIVED NO GROWTH TO DATE CULTURE WILL BE HELD FOR 5 DAYS BEFORE ISSUING A FINAL NEGATIVE REPORT     Performed at [facility]   Report Status PENDING   Incomplete  RESPIRATORY VIRUS PANEL     Status: Abnormal   Collection Time    12/16/13  3:09 AM      Result Value Range Status   Source - RVPAN NOSE   Final   Respiratory Syncytial Virus A NOT DETECTED   Final   Respiratory Syncytial Virus B NOT DETECTED   Final   Influenza A DETECTED (*)  Final   Influenza B NOT DETECTED   Final   Parainfluenza 1 NOT DETECTED   Final   Parainfluenza 2 NOT DETECTED   Final   Parainfluenza 3 NOT DETECTED   Final   Metapneumovirus NOT DETECTED   Final   Rhinovirus NOT DETECTED   Final   Adenovirus NOT DETECTED   Final   Influenza A H1 NOT DETECTED   Final   Influenza A H3 NOT DETECTED   Final   Comment: (NOTE)           Normal Reference Range for each Analyte: NOT DETECTED     Testing performed using the Luminex xTAG Respiratory Viral Panel test     kit.     This test was developed and its performance characteristics determined     by [facility]. It has not been cleared or approved by the [place]     Food and Drug Administration. This test is used for clinical purposes.     It should not be regarded as investigational or for research. This     laboratory is certified under the [place] (CLIA) as qualified to perform high complexity     clinical laboratory testing.     Performed at [place] PCR SCREENING     Status: None   Collection Time    12/16/13  4:31 AM  Result Value Range Status   MRSA  by PCR NEGATIVE  NEGATIVE Final   Comment:            The GeneXpert MRSA Assay (FDA     approved for NASAL specimens     only), is one component of a     comprehensive MRSA colonization     surveillance program. It is not     intended to diagnose MRSA     infection nor to guide or     monitor treatment for     MRSA infections.  RESPIRATORY VIRUS PANEL     Status: Abnormal   Collection Time    12/17/13  8:40 PM      Result Value Range Status   Source - RVPAN NASAL SWAB   Corrected   Comment: CORRECTED ON 12/30 AT 1827: PREVIOUSLY REPORTED AS NASAL SWAB   Respiratory Syncytial Virus A NOT DETECTED   Final   Respiratory Syncytial Virus B NOT DETECTED   Final   Influenza A DETECTED (*)  Final   Influenza B NOT DETECTED   Final   Parainfluenza 1 NOT DETECTED   Final   Parainfluenza 2 NOT DETECTED   Final   Parainfluenza 3 NOT DETECTED   Final   Metapneumovirus NOT DETECTED   Final   Rhinovirus NOT DETECTED   Final   Adenovirus NOT DETECTED   Final   Influenza A H1 NOT DETECTED   Final   Influenza A H3 DETECTED (*)  Final   Comment: (NOTE)           Normal Reference Range for each Analyte: NOT DETECTED     Testing performed using the Luminex xTAG Respiratory Viral Panel test     kit.     This test was developed and its performance characteristics determined     by [facility]. It has not been cleared or approved by the [place]     Food and Drug Administration. This test is used for clinical purposes.     It should not be regarded as investigational or for research. This     laboratory is certified under the [place] (CLIA) as qualified to perform high complexity     clinical laboratory testing.     Performed at [facility]     Studies: No results found.  Scheduled Meds: . aspirin EC  325 mg Oral Daily  . budesonide (PULMICORT) nebulizer solution  0.25 mg Nebulization BID  . clopidogrel  75 mg Oral Daily  .  enoxaparin (LOVENOX) injection  40 mg Subcutaneous Q24H  . furosemide  20 mg Oral q morning - 10a  . guaiFENesin  1,200 mg Oral BID  . insulin aspart  0-9 Units Subcutaneous TID WC  . ipratropium-albuterol  3 mL Nebulization Q4H  . levofloxacin (LEVAQUIN) IV  500 mg Intravenous Daily  . methylPREDNISolone (SOLU-MEDROL) injection  60 mg Intravenous Q8H  . metoprolol  50 mg Oral BID  . oseltamivir  75 mg Oral BID  . pantoprazole  40 mg Oral Q1200   Continuous Infusions:   Principal Problem:   COPD with acute exacerbation Active Problems:   CAD   Chest pain   COPD exacerbation   Fever   Influenza A  Time spent: 40min  [facility]  MD [facility] Pager [phone]. If 7PM-7AM, please contact night-coverage at [email or web address], password [email or web address] 12/21/2013, 5:34 PM  LOS: 6 days

## 2013-12-22 LAB — BASIC METABOLIC PANEL
BUN: 21 mg/dL (ref 6–23)
CALCIUM: 9.2 mg/dL (ref 8.4–10.5)
CHLORIDE: 100 meq/L (ref 96–112)
CO2: 35 meq/L — AB (ref 19–32)
CREATININE: 0.78 mg/dL (ref 0.50–1.35)
GFR calc Af Amer: >90 mL/min (ref >90)
GFR calc non Af Amer: >90 mL/min (ref >90)
GLUCOSE: 193 mg/dL — AB (ref 70–99)
Potassium: 4.6 mEq/L (ref 3.7–5.3)
Sodium: 144 mEq/L (ref 137–147)

## 2013-12-22 LAB — CULTURE, BLOOD (ROUTINE X 2)
Culture: NO GROWTH
Culture: NO GROWTH

## 2013-12-22 LAB — GLUCOSE, CAPILLARY
GLUCOSE-CAPILLARY: 173 mg/dL — AB (ref 70–99)
GLUCOSE-CAPILLARY: 136 mg/dL — AB (ref 70–99)
GLUCOSE-CAPILLARY: 134 mg/dL — AB (ref 70–99)
GLUCOSE-CAPILLARY: 200 mg/dL — AB (ref 70–99)

## 2013-12-22 MED ORDER — METHYLPREDNISOLONE SODIUM SUCC 125 MG IJ SOLR
60.0000 mg | Freq: Two times a day (BID) | INTRAMUSCULAR | Status: AC
  Administered 2013-12-23 – 2013-12-24 (×4): 60 mg via INTRAVENOUS
  Filled 2013-12-22 (×5): qty 0.96

## 2013-12-22 NOTE — Progress Notes (Signed)
Physical Therapy Treatment Patient Details Name: [name] MRN: [id number] DOB: 09/16/1951 Today's Date: 12/22/2013 Time: [id number] PT Time Calculation (min): 14 min  PT Assessment / Plan / Recommendation  History of Present Illness 63 y.o. male with history of COPD on home oxygen and chronic steroid therapy, CAD status post stenting presented to the ER because of chest pain and short of breath. Patient stated that he'd been having chronic shortness of breath but it had worsened.  The patient had also been getting chest pain off and on in the anterior chest - nonradiating pressure-like.   PT Comments   Pt now lives in a ground floor apartment and does not have any stairs to negotiate on discharge, therefore did not educate or perform stairs with this session.    Follow Up Recommendations  Home health PT;Supervision - Intermittent     Equipment Recommendations  None recommended by PT    Frequency Min 3X/week   Progress towards PT Goals Progress towards PT goals: Progressing toward goals  Plan Current plan remains appropriate    Precautions / Restrictions Precautions Precautions: Fall Precaution Comments: limited by SOB and O2 tubing Restrictions Weight Bearing Restrictions: No       Mobility  Bed Mobility Supine to Sit: 7: Independent Sit to Supine: 7: Independent Transfers Sit to Stand: 6: Modified independent (Device/Increase time);From bed;With upper extremity assist Stand to Sit: 6: Modified independent (Device/Increase time);To bed;With upper extremity assist Details for Transfer Assistance: no cues or physical assist needed with mobilty Ambulation/Gait Ambulation/Gait Assistance: 6: Modified independent (Device/Increase time) Ambulation Distance (Feet): 200 Feet Assistive device: None Ambulation/Gait Assistance Details: slight stagger at times with gait and enviroment scanning, no loss of balance noted. Gait Pattern: Step-to pattern Gait velocity: slow General  Gait Details: SaO2 >96% with 3 lpm via nasal canula with gait      PT Goals (current goals can now be found in the care plan section) Acute Rehab PT Goals Patient Stated Goal: go home when doctor says I'm ready PT Goal Formulation: With patient Time For Goal Achievement: 12/26/13 Potential to Achieve Goals: Good  Visit Information  Last PT Received On: 12/22/13 Assistance Needed: +1 History of Present Illness: 63 y.o. male with history of COPD on home oxygen and chronic steroid therapy, CAD status post stenting presented to the ER because of chest pain and short of breath. Patient stated that he'd been having chronic shortness of breath but it had worsened.  The patient had also been getting chest pain off and on in the anterior chest - nonradiating pressure-like.    Subjective Data  Patient Stated Goal: go home when doctor says I'm ready   Cognition  Cognition Arousal/Alertness: Awake/alert Behavior During Therapy: WFL for tasks assessed/performed Overall Cognitive Status: Within Functional Limits for tasks assessed       End of Session PT - End of Session Equipment Utilized During Treatment: Gait belt;Oxygen Activity Tolerance: Patient tolerated treatment well Patient left: in bed;with call bell/phone within reach Nurse Communication: Mobility status   GP     Sallyanne KusterBury, Sanda Dejoy 12/22/2013, 1:31 PM  Sallyanne KusterKathy Jaliyah Fotheringham, PTA Office- [phone]

## 2013-12-22 NOTE — Progress Notes (Signed)
[facility] PROGRESS NOTE  [name] SFS:[id number] DOB: 04/24/1951 DOA: 12/15/2013 PCP: No PCP Per Patient  Assessment/Plan: Acute bronchospastic exacerbation COPD  Continuing to slowly improve with better breath sounds but not yet at baseline  Influenza screen POSITIVE Remains O2 dependent in the hospital (not o2 dependent at home). Continue IV Levaquin. Continue oxygen, nebs, mucinex, tamiflu. Decrease IV Solumedrol to q12. Influenza Continue tamiflu Hx of CAD s/p stenting w/ acute CP  Trop negative x3 - cardiology not impressed and symptoms are suggestive of angina - plan is for outpatient stress testing once pulmonary status is stable  HTN  Blood pressure is currently well controlled  Tobacco abuse  Counseled on the absolute need to discontinue smoking - patient states that he has  Hyperglycemia  continue sliding scale insulin while on steroids  Code Status: Full Family Communication: Pt in room (indicate person spoken with, relationship, and if by phone, the number) Disposition Plan: Pending   Consultants:  Cardiology  Antibiotics: Rocephin 12/28 >> 12/30 Azithro 12/28 >>12/30 Tamiflu 12/30>>> IV Levaquin 12/21/13  HPI/Subjective: No acute issues overnight. Continuing to gradually feel better.  Objective: Filed Vitals:   12/21/13 2036 12/22/13 0512 12/22/13 0849 12/22/13 0944  BP: 138/78 128/82    Pulse: 88 72    Temp: 98.7 F (37.1 C) 98.4 F (36.9 C)    TempSrc: Oral Oral    Resp: 20 20    Height:      Weight:      SpO2: 95% 98% 96% 98%    Intake/Output Summary (Last 24 hours) at 12/22/13 1037 Last data filed at 12/22/13 0700  Gross per 24 hour  Intake   1200 ml  Output      0 ml  Net   1200 ml   Filed Weights   12/19/13 0357 12/20/13 0402 12/21/13 0442  Weight: 91.5 kg (201 lb 11.5 oz) 90.81 kg (200 lb 3.2 oz) 91.899 kg (202 lb 9.6 oz)    Exam:   General:  Awake, in nad  Cardiovascular: regular, s1, s2  Respiratory: decreased  BS, min wheezing. Difuse rhonchi  Abdomen: soft, nondistended  Musculoskeletal: perfused, no clubbing   Data Reviewed: Basic Metabolic Panel:  Recent Labs Lab 12/15/13 2200 12/16/13 0620 12/17/13 0447 12/22/13 0450  NA 140 137 137 144  K 4.1 4.2 4.2 4.6  CL 102 100 101 100  CO2 _0 35*  GLUCOSE 104* 251* 178* 193*  BUN _1 CREATININE 1.05 0.87 0.74 0.78  CALCIUM 9.0 8.3* 8.2* 9.2   Liver Function Tests:  Recent Labs Lab 12/16/13 0620 12/17/13 0447  AST 16 16  ALT 21 19  ALKPHOS 51 45  BILITOT 0.2* 0.2*  PROT 5.9* 5.4*  ALBUMIN 3.1* 2.9*   No results found for this basename: LIPASE, AMYLASE,  in the last 168 hours No results found for this basename: AMMONIA,  in the last 168 hours CBC:  Recent Labs Lab 12/15/13 2200 12/16/13 0620 12/17/13 0447  WBC 6.7 6.8 7.8  NEUTROABS  --  6.3  --   HGB 14.3 13.6 12.8*  HCT 42.9 41.2 38.4*  MCV 87.9 89.6 89.3  PLT 128* 133* 135*   Cardiac Enzymes:  Recent Labs Lab 12/15/13 2200 12/16/13 0620 12/16/13 1025 12/16/13 1615  TROPONINI <0.30 <0.30 <0.30 <0.30   BNP (last 3 results)  Recent Labs  02/07/13 1025 12/15/13 2200  PROBNP 192.9* 78.6   CBG:  Recent Labs Lab 12/21/13 0624 12/21/13 1113 12/21/13  1604 12/21/13 2040 12/22/13 0619  GLUCAP 113* 127* 182* 164* 173*    Recent Results (from the past 240 hour(s))  CULTURE, BLOOD (ROUTINE X 2)     Status: None   Collection Time    12/15/13 11:05 PM      Result Value Range Status   Specimen Description BLOOD RIGHT ANTECUBITAL   Final   Special Requests BOTTLES DRAWN AEROBIC AND ANAEROBIC 3CC   Final   Culture  Setup Time     Final   Value: 12/16/2013 03:06     Performed at [facility]   Culture     Final   Value:        BLOOD CULTURE RECEIVED NO GROWTH TO DATE CULTURE WILL BE HELD FOR 5 DAYS BEFORE ISSUING A FINAL NEGATIVE REPORT     Performed at [facility]   Report Status PENDING   Incomplete  CULTURE, BLOOD  (ROUTINE X 2)     Status: None   Collection Time    12/15/13 11:10 PM      Result Value Range Status   Specimen Description BLOOD RIGHT FOREARM   Final   Special Requests BOTTLES DRAWN AEROBIC ONLY 4CC   Final   Culture  Setup Time     Final   Value: 12/16/2013 03:06     Performed at [facility]   Culture     Final   Value:        BLOOD CULTURE RECEIVED NO GROWTH TO DATE CULTURE WILL BE HELD FOR 5 DAYS BEFORE ISSUING A FINAL NEGATIVE REPORT     Performed at [facility]   Report Status PENDING   Incomplete  RESPIRATORY VIRUS PANEL     Status: Abnormal   Collection Time    12/16/13  3:09 AM      Result Value Range Status   Source - RVPAN NOSE   Final   Respiratory Syncytial Virus A NOT DETECTED   Final   Respiratory Syncytial Virus B NOT DETECTED   Final   Influenza A DETECTED (*)  Final   Influenza B NOT DETECTED   Final   Parainfluenza 1 NOT DETECTED   Final   Parainfluenza 2 NOT DETECTED   Final   Parainfluenza 3 NOT DETECTED   Final   Metapneumovirus NOT DETECTED   Final   Rhinovirus NOT DETECTED   Final   Adenovirus NOT DETECTED   Final   Influenza A H1 NOT DETECTED   Final   Influenza A H3 NOT DETECTED   Final   Comment: (NOTE)           Normal Reference Range for each Analyte: NOT DETECTED     Testing performed using the Luminex xTAG Respiratory Viral Panel test     kit.     This test was developed and its performance characteristics determined     by [facility]. It has not been cleared or approved by the [place]     Food and Drug Administration. This test is used for clinical purposes.     It should not be regarded as investigational or for research. This     laboratory is certified under the [place] (CLIA) as qualified to perform high complexity     clinical laboratory testing.     Performed at [facility] PCR SCREENING     Status: None   Collection Time  12/16/13  4:31 AM       Result Value Range Status   MRSA by PCR NEGATIVE  NEGATIVE Final   Comment:            The GeneXpert MRSA Assay (FDA     approved for NASAL specimens     only), is one component of a     comprehensive MRSA colonization     surveillance program. It is not     intended to diagnose MRSA     infection nor to guide or     monitor treatment for     MRSA infections.  RESPIRATORY VIRUS PANEL     Status: Abnormal   Collection Time    12/17/13  8:40 PM      Result Value Range Status   Source - RVPAN NASAL SWAB   Corrected   Comment: CORRECTED ON 12/30 AT 1827: PREVIOUSLY REPORTED AS NASAL SWAB   Respiratory Syncytial Virus A NOT DETECTED   Final   Respiratory Syncytial Virus B NOT DETECTED   Final   Influenza A DETECTED (*)  Final   Influenza B NOT DETECTED   Final   Parainfluenza 1 NOT DETECTED   Final   Parainfluenza 2 NOT DETECTED   Final   Parainfluenza 3 NOT DETECTED   Final   Metapneumovirus NOT DETECTED   Final   Rhinovirus NOT DETECTED   Final   Adenovirus NOT DETECTED   Final   Influenza A H1 NOT DETECTED   Final   Influenza A H3 DETECTED (*)  Final   Comment: (NOTE)           Normal Reference Range for each Analyte: NOT DETECTED     Testing performed using the Luminex xTAG Respiratory Viral Panel test     kit.     This test was developed and its performance characteristics determined     by [facility]. It has not been cleared or approved by the [place]     Food and Drug Administration. This test is used for clinical purposes.     It should not be regarded as investigational or for research. This     laboratory is certified under the [place] (CLIA) as qualified to perform high complexity     clinical laboratory testing.     Performed at [facility]     Studies: No results found.  Scheduled Meds: . aspirin EC  325 mg Oral Daily  . budesonide (PULMICORT) nebulizer solution  0.25 mg Nebulization BID  .  clopidogrel  75 mg Oral Daily  . enoxaparin (LOVENOX) injection  40 mg Subcutaneous Q24H  . furosemide  20 mg Oral q morning - 10a  . guaiFENesin  1,200 mg Oral BID  . insulin aspart  0-9 Units Subcutaneous TID WC  . ipratropium-albuterol  3 mL Nebulization Q4H  . levofloxacin (LEVAQUIN) IV  500 mg Intravenous Daily  . methylPREDNISolone (SOLU-MEDROL) injection  60 mg Intravenous Q8H  . metoprolol  50 mg Oral BID  . oseltamivir  75 mg Oral BID  . pantoprazole  40 mg Oral Q1200   Continuous Infusions:   Principal Problem:   COPD with acute exacerbation Active Problems:   CAD   Chest pain   COPD exacerbation   Fever   Influenza A  Time spent: 64mn  [facility] MD [facility] Pager [phone] If 7PM-7AM, please contact night-coverage at [email or web address], password [email or web address]1/02/2014, 10:37  AM  LOS: 7 days

## 2013-12-23 LAB — BASIC METABOLIC PANEL
BUN: 21 mg/dL (ref 6–23)
CHLORIDE: 95 meq/L — AB (ref 96–112)
CO2: 38 mEq/L — ABNORMAL HIGH (ref 19–32)
Calcium: 8.9 mg/dL (ref 8.4–10.5)
Creatinine, Ser: 0.79 mg/dL (ref 0.50–1.35)
GFR calc Af Amer: >90 mL/min (ref >90)
Glucose, Bld: 276 mg/dL — ABNORMAL HIGH (ref 70–99)
POTASSIUM: 4.4 meq/L (ref 3.7–5.3)
SODIUM: 140 meq/L (ref 137–147)

## 2013-12-23 LAB — GLUCOSE, CAPILLARY
GLUCOSE-CAPILLARY: 160 mg/dL — AB (ref 70–99)
GLUCOSE-CAPILLARY: 107 mg/dL — AB (ref 70–99)
GLUCOSE-CAPILLARY: 239 mg/dL — AB (ref 70–99)
Glucose-Capillary: 170 mg/dL — ABNORMAL HIGH (ref 70–99)

## 2013-12-23 MED ORDER — LEVOFLOXACIN 500 MG PO TABS
500.0000 mg | ORAL_TABLET | Freq: Every day | ORAL | Status: DC
  Administered 2013-12-24 – 2013-12-25 (×2): 500 mg via ORAL
  Filled 2013-12-23 (×2): qty 1

## 2013-12-23 NOTE — Progress Notes (Signed)
Patient ambulated 200 ft on 3 liters of oxygen, saturation level at  93 to 94% heart rate upper 90s to low 100s. Prior to ambulating patient sat at bedside on room air saturation in the mid 90s, however, once he began to ambulate his saturation level dropped to upper 80s while still on room air. Patient returned to his room and bed with call bell and reach. Will continue to monitor.

## 2013-12-23 NOTE — Progress Notes (Signed)
[facility] PROGRESS NOTE  [name] GGE:[id number] DOB: [birth year] DOA: 12/15/2013 PCP: No PCP Per Patient  Assessment/Plan: Acute bronchospastic exacerbation COPD  Continuing to slowly improve with better breath sounds but not yet at baseline  Influenza screen POSITIVE and s/p tamiflu. Remains O2 dependent in the hospital (on o2 dependent at home). Change IV Levaquin to oral levaquin. Continue oxygen, nebs, mucinex, tamiflu. Change IV Solumedrol to oral prednisone tomorrow. Flutter valve. Chest PT. Will need to f/u with pulm as outpatient. Influenza S/p 5 days tamiflu Hx of CAD s/p stenting w/ acute CP  Trop negative x3 - cardiology not impressed and symptoms are suggestive of angina - plan is for outpatient stress testing once pulmonary status is stable  HTN  Blood pressure is currently well controlled  Tobacco abuse  Counseled on the absolute need to discontinue smoking - patient states that he has  Hyperglycemia  continue sliding scale insulin while on steroids  Code Status: Full Family Communication: Pt in room (indicate person spoken with, relationship, and if by phone, the number) Disposition Plan: Home when medically stable.   Consultants:  Cardiology  Antibiotics: Rocephin 12/28 >> 12/30 Azithro 12/28 >>12/30 Tamiflu 12/30>>> IV Levaquin 12/21/13--->12/23/13 Oral levaquin 12/23/13  HPI/Subjective: No acute issues overnight. Continuing to gradually feel better.  Objective: Filed Vitals:   12/22/13 1625 12/22/13 2150 12/23/13 0420 12/23/13 1043  BP:  138/78 111/77 136/79  Pulse:  95 74 79  Temp:  98 F (36.7 C) 98.4 F (36.9 C)   TempSrc:  Oral Oral   Resp:  20 18   Height:      Weight:      SpO2: 97% 97% 98%     Intake/Output Summary (Last 24 hours) at 12/23/13 1059 Last data filed at 12/22/13 1700  Gross per 24 hour  Intake    840 ml  Output      0 ml  Net    840 ml   Filed Weights   12/19/13 0357 12/20/13 0402 12/21/13 0442  Weight: 91.5  kg (201 lb 11.5 oz) 90.81 kg (200 lb 3.2 oz) 91.899 kg (202 lb 9.6 oz)    Exam:   General:  Awake, in nad  Cardiovascular: regular, s1, s2  Respiratory: decreased BS, min wheezing. Diffuse rhonchi  Abdomen: soft, nondistended, nt, +bs  Musculoskeletal: perfused, no clubbing   Data Reviewed: Basic Metabolic Panel:  Recent Labs Lab 12/17/13 0447 12/22/13 0450 12/23/13 0946  NA 137 144 140  K 4.2 4.6 4.4  CL 101 100 95*  CO2 29 35* 38*  GLUCOSE 178* 193* 276*  BUN $Re'18 21 21  'wqF$ CREATININE 0.74 0.78 0.79  CALCIUM 8.2* 9.2 8.9   Liver Function Tests:  Recent Labs Lab 12/17/13 0447  AST 16  ALT 19  ALKPHOS 45  BILITOT 0.2*  PROT 5.4*  ALBUMIN 2.9*   No results found for this basename: LIPASE, AMYLASE,  in the last 168 hours No results found for this basename: AMMONIA,  in the last 168 hours CBC:  Recent Labs Lab 12/17/13 0447  WBC 7.8  HGB 12.8*  HCT 38.4*  MCV 89.3  PLT 135*   Cardiac Enzymes:  Recent Labs Lab 12/16/13 1615  TROPONINI <0.30   BNP (last 3 results)  Recent Labs  02/07/13 1025 12/15/13 2200  PROBNP 192.9* 78.6   CBG:  Recent Labs Lab 12/22/13 0619 12/22/13 1143 12/22/13 1635 12/22/13 2143 12/23/13 0731  GLUCAP 173* 136* 134* 200* 170*    Recent Results (from  the past 240 hour(s))  CULTURE, BLOOD (ROUTINE X 2)     Status: None   Collection Time    12/15/13 11:05 PM      Result Value Range Status   Specimen Description BLOOD RIGHT ANTECUBITAL   Final   Special Requests BOTTLES DRAWN AEROBIC AND ANAEROBIC 3CC   Final   Culture  Setup Time     Final   Value: 12/16/2013 03:06     Performed at [facility]   Culture     Final   Value: NO GROWTH 5 DAYS     Performed at [facility]   Report Status 12/22/2013 FINAL   Final  CULTURE, BLOOD (ROUTINE X 2)     Status: None   Collection Time    12/15/13 11:10 PM      Result Value Range Status   Specimen Description BLOOD RIGHT FOREARM   Final   Special  Requests BOTTLES DRAWN AEROBIC ONLY 4CC   Final   Culture  Setup Time     Final   Value: 12/16/2013 03:06     Performed at [facility]   Culture     Final   Value: NO GROWTH 5 DAYS     Performed at [facility]   Report Status 12/22/2013 FINAL   Final  RESPIRATORY VIRUS PANEL     Status: Abnormal   Collection Time    12/16/13  3:09 AM      Result Value Range Status   Source - RVPAN NOSE   Final   Respiratory Syncytial Virus A NOT DETECTED   Final   Respiratory Syncytial Virus B NOT DETECTED   Final   Influenza A DETECTED (*)  Final   Influenza B NOT DETECTED   Final   Parainfluenza 1 NOT DETECTED   Final   Parainfluenza 2 NOT DETECTED   Final   Parainfluenza 3 NOT DETECTED   Final   Metapneumovirus NOT DETECTED   Final   Rhinovirus NOT DETECTED   Final   Adenovirus NOT DETECTED   Final   Influenza A H1 NOT DETECTED   Final   Influenza A H3 NOT DETECTED   Final   Comment: (NOTE)           Normal Reference Range for each Analyte: NOT DETECTED     Testing performed using the Luminex xTAG Respiratory Viral Panel test     kit.     This test was developed and its performance characteristics determined     by [facility]. It has not been cleared or approved by the [place]     Food and Drug Administration. This test is used for clinical purposes.     It should not be regarded as investigational or for research. This     laboratory is certified under the [place] (CLIA) as qualified to perform high complexity     clinical laboratory testing.     Performed at [facility] PCR SCREENING     Status: None   Collection Time    12/16/13  4:31 AM      Result Value Range Status   MRSA by PCR NEGATIVE  NEGATIVE Final   Comment:            The GeneXpert MRSA Assay (FDA     approved for NASAL specimens     only), is one component of a  comprehensive MRSA colonization     surveillance program. It is not      intended to diagnose MRSA     infection nor to guide or     monitor treatment for     MRSA infections.  RESPIRATORY VIRUS PANEL     Status: Abnormal   Collection Time    12/17/13  8:40 PM      Result Value Range Status   Source - RVPAN NASAL SWAB   Corrected   Comment: CORRECTED ON 12/30 AT 1827: PREVIOUSLY REPORTED AS NASAL SWAB   Respiratory Syncytial Virus A NOT DETECTED   Final   Respiratory Syncytial Virus B NOT DETECTED   Final   Influenza A DETECTED (*)  Final   Influenza B NOT DETECTED   Final   Parainfluenza 1 NOT DETECTED   Final   Parainfluenza 2 NOT DETECTED   Final   Parainfluenza 3 NOT DETECTED   Final   Metapneumovirus NOT DETECTED   Final   Rhinovirus NOT DETECTED   Final   Adenovirus NOT DETECTED   Final   Influenza A H1 NOT DETECTED   Final   Influenza A H3 DETECTED (*)  Final   Comment: (NOTE)           Normal Reference Range for each Analyte: NOT DETECTED     Testing performed using the Luminex xTAG Respiratory Viral Panel test     kit.     This test was developed and its performance characteristics determined     by [facility]. It has not been cleared or approved by the [place]     Food and Drug Administration. This test is used for clinical purposes.     It should not be regarded as investigational or for research. This     laboratory is certified under the [place] (CLIA) as qualified to perform high complexity     clinical laboratory testing.     Performed at [facility]     Studies: No results found.  Scheduled Meds: . aspirin EC  325 mg Oral Daily  . budesonide (PULMICORT) nebulizer solution  0.25 mg Nebulization BID  . clopidogrel  75 mg Oral Daily  . enoxaparin (LOVENOX) injection  40 mg Subcutaneous Q24H  . furosemide  20 mg Oral q morning - 10a  . guaiFENesin  1,200 mg Oral BID  . insulin aspart  0-9 Units Subcutaneous TID WC  . ipratropium-albuterol  3 mL Nebulization Q4H  .  levofloxacin (LEVAQUIN) IV  500 mg Intravenous Daily  . methylPREDNISolone (SOLU-MEDROL) injection  60 mg Intravenous Q12H  . metoprolol  50 mg Oral BID  . pantoprazole  40 mg Oral Q1200   Continuous Infusions:   Principal Problem:   COPD with acute exacerbation Active Problems:   CAD   Chest pain   COPD exacerbation   Fever   Influenza A  Time spent: 67min  [facility]  MD [facility] Pager [phone]. If 7PM-7AM, please contact night-coverage at [email or web address], password [facility] 12/23/2013, 10:59 AM  LOS: 8 days

## 2013-12-24 LAB — GLUCOSE, CAPILLARY
GLUCOSE-CAPILLARY: 155 mg/dL — AB (ref 70–99)
GLUCOSE-CAPILLARY: 279 mg/dL — AB (ref 70–99)
Glucose-Capillary: 129 mg/dL — ABNORMAL HIGH (ref 70–99)
Glucose-Capillary: 185 mg/dL — ABNORMAL HIGH (ref 70–99)

## 2013-12-24 LAB — BASIC METABOLIC PANEL
BUN: 21 mg/dL (ref 6–23)
CO2: 39 meq/L — AB (ref 19–32)
CREATININE: 0.75 mg/dL (ref 0.50–1.35)
Calcium: 8.9 mg/dL (ref 8.4–10.5)
Chloride: 97 mEq/L (ref 96–112)
GFR calc Af Amer: >90 mL/min (ref >90)
Glucose, Bld: 110 mg/dL — ABNORMAL HIGH (ref 70–99)
Potassium: 4.4 mEq/L (ref 3.7–5.3)
SODIUM: 143 meq/L (ref 137–147)

## 2013-12-24 MED ORDER — ZOLPIDEM TARTRATE 5 MG PO TABS
5.0000 mg | ORAL_TABLET | Freq: Every evening | ORAL | Status: DC | PRN
  Administered 2013-12-24: 5 mg via ORAL
  Filled 2013-12-24: qty 1

## 2013-12-24 MED ORDER — PREDNISONE 10 MG PO TABS
60.0000 mg | ORAL_TABLET | Freq: Every day | ORAL | Status: DC
  Administered 2013-12-25: 60 mg via ORAL
  Filled 2013-12-24 (×2): qty 1

## 2013-12-24 NOTE — Progress Notes (Signed)
The Chaplain stopped by for an initial visit with the patient today but the patient was resting. A follow up visit will be carried out by the Chaplain.  Chaplain Bryson HaKwan Katrinna Travieso

## 2013-12-24 NOTE — Progress Notes (Signed)
[facility] PROGRESS NOTE  [name] DSK:[id number] DOB: [birth year] DOA: 12/15/2013 PCP: No PCP Per Patient  Assessment/Plan: Acute bronchospastic exacerbation COPD  Continuing to slowly improve with better breath sounds but not yet at baseline  Influenza screen POSITIVE and s/p tamiflu. Remains O2 dependent in the hospital (on o2 dependent at home). Continue oral levaquin. Continue oxygen, nebs, mucinex, tamiflu. Change IV Solumedrol to oral prednisone. Flutter valve. Chest PT. Will need to f/u with pulm as outpatient. Influenza S/p 5 days tamiflu Hx of CAD s/p stenting w/ acute CP  Trop negative x3 - cardiology not impressed and symptoms are suggestive of angina - plan is for outpatient stress testing once pulmonary status is stable  HTN  Blood pressure is currently well controlled  Tobacco abuse  Counseled on the absolute need to discontinue smoking - patient states that he has  Hyperglycemia  continue sliding scale insulin while on steroids  Code Status: Full Family Communication: Pt in room (indicate person spoken with, relationship, and if by phone, the number) Disposition Plan: Home when medically stable hopefully tomorrow.   Consultants:  Cardiology  Antibiotics: Rocephin 12/28 >> 12/30 Azithro 12/28 >>12/30 Tamiflu 12/30>>> IV Levaquin 12/21/13--->12/23/13 Oral levaquin 12/23/13  HPI/Subjective: No acute issues overnight. Continuing to gradually feel better. Sitting up in the chair.  Objective: Filed Vitals:   12/23/13 2047 12/24/13 0010 12/24/13 0523 12/24/13 1358  BP: 139/72  117/76 117/93  Pulse: 89  76 78  Temp: 98 F (36.7 C)  97.7 F (36.5 C) 98.2 F (36.8 C)  TempSrc: Oral  Oral Oral  Resp: _0 Height:      Weight:      SpO2: 97% 98% 97% 99%    Intake/Output Summary (Last 24 hours) at 12/24/13 1427 Last data filed at 12/24/13 1252  Gross per 24 hour  Intake   1440 ml  Output      0 ml  Net   1440 ml   Filed Weights   12/19/13  0357 12/20/13 0402 12/21/13 0442  Weight: 91.5 kg (201 lb 11.5 oz) 90.81 kg (200 lb 3.2 oz) 91.899 kg (202 lb 9.6 oz)    Exam:   General:  Awake, in nad  Cardiovascular: regular, s1, s2  Respiratory: decreased BS, min wheezing. Diffuse rhonchi  Abdomen: soft, nondistended, nt, +bs  Musculoskeletal: perfused, no clubbing   Data Reviewed: Basic Metabolic Panel:  Recent Labs Lab 12/22/13 0450 12/23/13 0946 12/24/13 0417  NA 144 140 143  K 4.6 4.4 4.4  CL 100 95* 97  CO2 35* 38* 39*  GLUCOSE 193* 276* 110*  BUN _1 CREATININE 0.78 0.79 0.75  CALCIUM 9.2 8.9 8.9   Liver Function Tests: No results found for this basename: AST, ALT, ALKPHOS, BILITOT, PROT, ALBUMIN,  in the last 168 hours No results found for this basename: LIPASE, AMYLASE,  in the last 168 hours No results found for this basename: AMMONIA,  in the last 168 hours CBC: No results found for this basename: WBC, NEUTROABS, HGB, HCT, MCV, PLT,  in the last 168 hours Cardiac Enzymes: No results found for this basename: CKTOTAL, CKMB, CKMBINDEX, TROPONINI,  in the last 168 hours BNP (last 3 results)  Recent Labs  02/07/13 1025 12/15/13 2200  PROBNP 192.9* 78.6   CBG:  Recent Labs Lab 12/23/13 1220 12/23/13 1644 12/23/13 2045 12/24/13 0636 12/24/13 1133  GLUCAP 160* 107* 239* 155* 279*    Recent Results (from the past 240 hour(s))  CULTURE, BLOOD (ROUTINE X 2)     Status: None   Collection Time    12/15/13 11:05 PM      Result Value Range Status   Specimen Description BLOOD RIGHT ANTECUBITAL   Final   Special Requests BOTTLES DRAWN AEROBIC AND ANAEROBIC 3CC   Final   Culture  Setup Time     Final   Value: 12/16/2013 03:06     Performed at [facility]   Culture     Final   Value: NO GROWTH 5 DAYS     Performed at [facility]   Report Status 12/22/2013 FINAL   Final  CULTURE, BLOOD (ROUTINE X 2)     Status: None   Collection Time    12/15/13 11:10 PM      Result  Value Range Status   Specimen Description BLOOD RIGHT FOREARM   Final   Special Requests BOTTLES DRAWN AEROBIC ONLY 4CC   Final   Culture  Setup Time     Final   Value: 12/16/2013 03:06     Performed at [facility]   Culture     Final   Value: NO GROWTH 5 DAYS     Performed at [facility]   Report Status 12/22/2013 FINAL   Final  RESPIRATORY VIRUS PANEL     Status: Abnormal   Collection Time    12/16/13  3:09 AM      Result Value Range Status   Source - RVPAN NOSE   Final   Respiratory Syncytial Virus A NOT DETECTED   Final   Respiratory Syncytial Virus B NOT DETECTED   Final   Influenza A DETECTED (*)  Final   Influenza B NOT DETECTED   Final   Parainfluenza 1 NOT DETECTED   Final   Parainfluenza 2 NOT DETECTED   Final   Parainfluenza 3 NOT DETECTED   Final   Metapneumovirus NOT DETECTED   Final   Rhinovirus NOT DETECTED   Final   Adenovirus NOT DETECTED   Final   Influenza A H1 NOT DETECTED   Final   Influenza A H3 NOT DETECTED   Final   Comment: (NOTE)           Normal Reference Range for each Analyte: NOT DETECTED     Testing performed using the Luminex xTAG Respiratory Viral Panel test     kit.     This test was developed and its performance characteristics determined     by [facility]. It has not been cleared or approved by the [place]     Food and Drug Administration. This test is used for clinical purposes.     It should not be regarded as investigational or for research. This     laboratory is certified under the [place] (CLIA) as qualified to perform high complexity     clinical laboratory testing.     Performed at [facility] PCR SCREENING     Status: None   Collection Time    12/16/13  4:31 AM      Result Value Range Status   MRSA by PCR NEGATIVE  NEGATIVE Final   Comment:            The GeneXpert MRSA Assay (FDA     approved for NASAL specimens     only), is one component  of a     comprehensive MRSA colonization  surveillance program. It is not     intended to diagnose MRSA     infection nor to guide or     monitor treatment for     MRSA infections.  RESPIRATORY VIRUS PANEL     Status: Abnormal   Collection Time    12/17/13  8:40 PM      Result Value Range Status   Source - RVPAN NASAL SWAB   Corrected   Comment: CORRECTED ON 12/30 AT 1827: PREVIOUSLY REPORTED AS NASAL SWAB   Respiratory Syncytial Virus A NOT DETECTED   Final   Respiratory Syncytial Virus B NOT DETECTED   Final   Influenza A DETECTED (*)  Final   Influenza B NOT DETECTED   Final   Parainfluenza 1 NOT DETECTED   Final   Parainfluenza 2 NOT DETECTED   Final   Parainfluenza 3 NOT DETECTED   Final   Metapneumovirus NOT DETECTED   Final   Rhinovirus NOT DETECTED   Final   Adenovirus NOT DETECTED   Final   Influenza A H1 NOT DETECTED   Final   Influenza A H3 DETECTED (*)  Final   Comment: (NOTE)           Normal Reference Range for each Analyte: NOT DETECTED     Testing performed using the Luminex xTAG Respiratory Viral Panel test     kit.     This test was developed and its performance characteristics determined     by [facility]. It has not been cleared or approved by the [place]     Food and Drug Administration. This test is used for clinical purposes.     It should not be regarded as investigational or for research. This     laboratory is certified under the [place] (CLIA) as qualified to perform high complexity     clinical laboratory testing.     Performed at [facility]     Studies: No results found.  Scheduled Meds: . aspirin EC  325 mg Oral Daily  . budesonide (PULMICORT) nebulizer solution  0.25 mg Nebulization BID  . clopidogrel  75 mg Oral Daily  . enoxaparin (LOVENOX) injection  40 mg Subcutaneous Q24H  . furosemide  20 mg Oral q morning - 10a  . guaiFENesin  1,200 mg Oral BID  . insulin aspart  0-9  Units Subcutaneous TID WC  . ipratropium-albuterol  3 mL Nebulization Q4H  . levofloxacin  500 mg Oral Daily  . methylPREDNISolone (SOLU-MEDROL) injection  60 mg Intravenous Q12H  . metoprolol  50 mg Oral BID  . pantoprazole  40 mg Oral Q1200   Continuous Infusions:   Principal Problem:   COPD with acute exacerbation Active Problems:   CAD   Chest pain   COPD exacerbation   Fever   Influenza A  Time spent: 66mn  [facility] MD [facility] Pager [phone] If 7PM-7AM, please contact night-coverage at [email or web address], password [facility]1/04/2014, 2:27 PM  LOS: 9 days

## 2013-12-24 NOTE — Progress Notes (Signed)
Pt requesting sleeping for tonight; no meds ordered; MD paged to make aware; will await callback.

## 2013-12-24 NOTE — Progress Notes (Signed)
Physical Therapy Treatment Patient Details Name: Chris ProphetUlysses G Nagengast MRN: [id number] DOB: 09/25/1951 Today's Date: 12/24/2013 Time: [id number] PT Time Calculation (min): 20 min  PT Assessment / Plan / Recommendation  History of Present Illness 63 y.o. male with history of COPD on home oxygen and chronic steroid therapy, CAD status post stenting presented to the ER because of chest pain and short of breath. Patient stated that he'd been having chronic shortness of breath but it had worsened.  The patient had also been getting chest pain off and on in the anterior chest - nonradiating pressure-like.   PT Comments   Pt progressing well, but still limited at times by respiratory status.  Should do well with available assist of his daughter and HHPT to improve function and activity tolerance.   Follow Up Recommendations  Home health PT;Supervision - Intermittent     Does the patient have the potential to tolerate intense rehabilitation     Barriers to Discharge        Equipment Recommendations  None recommended by PT    Recommendations for Other Services    Frequency Min 3X/week   Progress towards PT Goals Progress towards PT goals: Progressing toward goals  Plan Current plan remains appropriate    Precautions / Restrictions Precautions Precautions: Fall Precaution Comments: limited by SOB, but improving Restrictions Weight Bearing Restrictions: No   Pertinent Vitals/Pain EHR low 90's, sats on 3 LNC >=96% after breathing treatment    Mobility  Bed Mobility Bed Mobility: Not assessed Transfers Transfers: Sit to Stand;Stand to Sit Sit to Stand: 7: Independent Stand to Sit: 7: Independent Ambulation/Gait Ambulation/Gait Assistance: 6: Modified independent (Device/Increase time) Ambulation Distance (Feet): 400 Feet Assistive device: None (pulling his on O2 tank) Ambulation/Gait Assistance Details: generally steady gait, cadence slower than necessary to get fully around the community  at this point.  Sats maintained >=96% during gait Gait Pattern: Step-through pattern General Gait Details: SaO2 >=96% with 3 lpm via nasal canula with gait  EHR low 90's Stairs: No    Exercises     PT Diagnosis:    PT Problem List:   PT Treatment Interventions:     PT Goals (current goals can now be found in the care plan section) Acute Rehab PT Goals Time For Goal Achievement: 12/26/13 Potential to Achieve Goals: Good  Visit Information  Last PT Received On: 12/24/13 Assistance Needed: +1 History of Present Illness: 63 y.o. male with history of COPD on home oxygen and chronic steroid therapy, CAD status post stenting presented to the ER because of chest pain and short of breath. Patient stated that he'd been having chronic shortness of breath but it had worsened.  The patient had also been getting chest pain off and on in the anterior chest - nonradiating pressure-like.    Subjective Data  Subjective: I still feel like I'm having trouble breathing, it may be this mask making me feel thie way   Cognition  Cognition Arousal/Alertness: Awake/alert Behavior During Therapy: WFL for tasks assessed/performed Overall Cognitive Status: Within Functional Limits for tasks assessed    Balance  Balance Balance Assessed: Yes Static Standing Balance Static Standing - Level of Assistance: 6: Modified independent (Device/Increase time)  End of Session PT - End of Session Equipment Utilized During Treatment: Oxygen Activity Tolerance: Patient tolerated treatment well Patient left: Other (comment) (sitting EOB) Nurse Communication: Mobility status   GP     Daryn Hicks, Eliseo GumKenneth V 12/24/2013, 1:11 PM 12/24/2013  [place], [place] [phone]  (pager)

## 2013-12-25 LAB — BASIC METABOLIC PANEL
BUN: 20 mg/dL (ref 6–23)
CHLORIDE: 95 meq/L — AB (ref 96–112)
CO2: 37 mEq/L — ABNORMAL HIGH (ref 19–32)
Calcium: 9.1 mg/dL (ref 8.4–10.5)
Creatinine, Ser: 0.8 mg/dL (ref 0.50–1.35)
GFR calc Af Amer: >90 mL/min (ref >90)
Glucose, Bld: 167 mg/dL — ABNORMAL HIGH (ref 70–99)
POTASSIUM: 4.6 meq/L (ref 3.7–5.3)
SODIUM: 142 meq/L (ref 137–147)

## 2013-12-25 LAB — GLUCOSE, CAPILLARY
GLUCOSE-CAPILLARY: 88 mg/dL (ref 70–99)
Glucose-Capillary: 160 mg/dL — ABNORMAL HIGH (ref 70–99)

## 2013-12-25 MED ORDER — PANTOPRAZOLE SODIUM 40 MG PO TBEC: 40.0000 mg | DELAYED_RELEASE_TABLET | Freq: Every day | ORAL | Status: DC

## 2013-12-25 MED ORDER — TRAZODONE HCL 50 MG PO TABS: 50.0000 mg | ORAL_TABLET | Freq: Every evening | ORAL | Status: DC | PRN

## 2013-12-25 MED ORDER — FUROSEMIDE 20 MG PO TABS: 20.0000 mg | ORAL_TABLET | Freq: Every morning | ORAL | Status: DC

## 2013-12-25 MED ORDER — IPRATROPIUM-ALBUTEROL 0.5-2.5 (3) MG/3ML IN SOLN: 3.0000 mL | RESPIRATORY_TRACT | Status: DC | PRN

## 2013-12-25 MED ORDER — BUDESONIDE 0.25 MG/2ML IN SUSP: 0.2500 mg | Freq: Two times a day (BID) | RESPIRATORY_TRACT | Status: DC

## 2013-12-25 MED ORDER — GUAIFENESIN ER 600 MG PO TB12: 1200.0000 mg | ORAL_TABLET | Freq: Two times a day (BID) | ORAL | Status: AC

## 2013-12-25 MED ORDER — PREDNISONE 20 MG PO TABS: 60.0000 mg | ORAL_TABLET | Freq: Every day | ORAL | Status: DC

## 2013-12-25 MED ORDER — HYDROCODONE-ACETAMINOPHEN 5-325 MG PO TABS: 1.0000 | ORAL_TABLET | ORAL | Status: DC | PRN

## 2013-12-25 MED ORDER — CLOPIDOGREL BISULFATE 75 MG PO TABS: 75.0000 mg | ORAL_TABLET | Freq: Every day | ORAL | Status: DC

## 2013-12-25 NOTE — Discharge Summary (Signed)
Physician Discharge Summary  [name] MRN:[id number] DOB: 10/21/1951 DOA: 12/15/2013  PCP: No PCP Per Patient  Admit date: 12/15/2013 Discharge date: 12/25/2013  Time spent: 70 minutes  Recommendations for Outpatient Follow-up:  1. Followup with Tammy Parrett, NP, [facility] on 01/04/2014. 2. Followup with Dr. Hochrein 2 weeks post discharge for followup on his chest pain and possible outpatient Lexiscan Myoview. 3. [facility].  Discharge Diagnoses:  Principal Problem:   COPD with acute exacerbation Active Problems:   CAD   Chest pain   COPD exacerbation   Fever   Influenza A   Discharge Condition: Stable and improved  Diet recommendation: Heart healthy  Filed Weights   12/20/13 0402 12/21/13 0442 12/25/13 0414  Weight: 90.81 kg (200 lb 3.2 oz) 91.899 kg (202 lb 9.6 oz) 91.354 kg (201 lb 6.4 oz)    History of present illness:  [name] is a 63 y.o. male with history of COPD on home oxygen and chronic steroid therapy, CAD status post stenting presented to the ER because of chest pain and short of breath. Patient states that he's been having chronic shortness of breath but 3 weeks ago it has worsened and had gone to his PCP Dr. Byrum who had increased his steroid dose and at this time is being tapered to his home dose. Despite which patient has been getting increasingly short of breath over the last few days with productive cough subjective feeling of fever and chills. Over the last few days patient has been getting chest pain off and on anterior chest nonradiating pressure-like. Patient states that last evening he became more severe than before. In the ER EKG were showing ST-T changes concerning for ST elevation in inferior leads. On-call cardiologist was consulted by the ER physician. As per the cardiologist the ST-T changes are nonspecific but wants patient to be transferred to [facility] and have cardiologist on  call see patient there. Patient otherwise denies any nausea vomiting abdominal pain. Chest x-ray does not show anything acute. On exam patient is wheezing bilaterally and is febrile. Blood cultures have been sent. Influenza PCR is pending and patient has been started on empiric antibiotics for possible pneumonia. Patient has been placed on nebulizer and heparin infusion.    Hospital Course:  Acute bronchospastic exacerbation COPD  Patient was admitted with worsening shortness of breath diffuse wheezing and had failed outpatient treatment. EKG which was done showed some nonspecific ST-T wave changes. Chest x-ray which was done was consistent with emphysema however showed no acute infiltrate. Influenza PCR was also sent which came back positive and patient received 5 days worth of Tamiflu. Patient was admitted. Patient was placed on IV steroids, oxygen, nebulizer treatments, Mucinex. This was also started on IV antibiotics. Patient improved clinically but slowly. IV steroids were tapered down slowly and patient be discharged home on the oral steroids taper of prednisone. Patient received a full course of antibiotics during the hospitalization as such we'll not require any further antibiotics. Patient will be discharged on DuoNeb as well as some Mucinex and will followup with pulmonary on 01/04/2014. Patient was discharged in stable and improved condition.  Influenza A Patient presented with shortness of breath diffuse wheezing .influenza PCR which was obtained came back positive for influenza A. Patient received 5 days tamiflu. Patient was discharged in stable and improved condition. Hx of CAD s/p stenting w/ acute CP  On presentation patient had complaints of intermittent chest pain. Cardiac   enzymes were cycled which were negative x3. Cardiology consultation was obtained. It was felt per cardiology that likely not ischemic in nature. Patient followup is recommended, for further evaluation and possible  outpatient stress test.  HTN  Blood pressure is currently well controlled during the hospitalization. Tobacco abuse  Counseled on the absolute need to discontinue smoking - patient states that he has  Hyperglycemia  continue sliding scale insulin while on steroids      Procedures:  Chest x-ray 12/15/2013  Consultations: Cardiology   Discharge Exam: Filed Vitals:   12/25/13 1132  BP: 132/87  Pulse: 90  Temp:   Resp:     General: NAD Cardiovascular: RRR Respiratory: Minimal expiratory wheezing.  Discharge Instructions      Discharge Orders   Future Appointments Provider Department Dept Phone   01/04/2014 4:15 PM Melvenia Needles, NP [facility] [phone]   01/10/2014 11:30 AM Lorayne Marek, MD [facility] [phone]   01/25/2014 10:15 AM Deneise Lever, MD [facility] [phone]   Future Orders Complete By Expires   Diet - low sodium heart healthy  As directed    Discharge instructions  As directed    Comments:     [facility] Follow up with Rexene Edison, NP [facility] on Friday 01/04/14 at 4pm   Increase activity slowly  As directed        Medication List    STOP taking these medications       albuterol (2.5 MG/3ML) 0.083% nebulizer solution  Commonly known as:  PROVENTIL      TAKE these medications       aspirin 325 MG tablet  Take 650 mg by mouth every morning.     aspirin-sod bicarb-citric acid 325 MG Tbef tablet  Commonly known as:  ALKA-SELTZER  Take 325 mg by mouth every 6 (six) hours as needed (for heartburn).     budesonide 0.25 MG/2ML nebulizer solution  Commonly known as:  PULMICORT  Take 2 mLs (0.25 mg total) by nebulization 2 (two) times daily.     clopidogrel 75 MG tablet  Commonly known as:  PLAVIX  Take 1 tablet (75 mg total) by mouth daily.     furosemide 20 MG tablet  Commonly known as:  LASIX  Take 1 tablet (20 mg total) by mouth  every morning.     guaiFENesin 600 MG 12 hr tablet  Commonly known as:  MUCINEX  Take 2 tablets (1,200 mg total) by mouth 2 (two) times daily. Take for 4 days the stop.     HYDROcodone-acetaminophen 5-325 MG per tablet  Commonly known as:  NORCO/VICODIN  Take 1 tablet by mouth every 4 (four) hours as needed for moderate pain.     ipratropium-albuterol 0.5-2.5 (3) MG/3ML Soln  Commonly known as:  DUONEB  Take 3 mLs by nebulization every 4 (four) hours as needed. Use every 4 hours x 4 days then use every 2-4 hours as needed.     metoprolol 50 MG tablet  Commonly known as:  LOPRESSOR  Take 1 tablet (50 mg total) by mouth 2 (two) times daily.     pantoprazole 40 MG tablet  Commonly known as:  PROTONIX  Take 1 tablet (40 mg total) by mouth daily at 12 noon.     predniSONE 20 MG tablet  Commonly known as:  DELTASONE  Take 3 tablets (60 mg total) by mouth daily. Take 3 tablets ( 60m) daily x  2 days, then 2 tablets (40mg) daily x 3 days, then 1 tablet (20mg) daily as previously taken.     traZODone 50 MG tablet  Commonly known as:  DESYREL  Take 1 tablet (50 mg total) by mouth at bedtime as needed for sleep.       Allergies  Allergen Reactions  . Lipitor [Atorvastatin Calcium] Other (See Comments)    Muscle aches   Follow-up Information   Follow up with [facility]     On 01/10/2014. (11:30; Please bring photo ID and all medications you are currently taking.  )    Contact information:   [address] [place] [address] [phone]      Follow up with PARRETT,TAMMY, NP On 01/04/2014. (f/u at 4pm)    Specialty:  Nurse Practitioner   Contact information:   [address] [place] [address] [phone]       Follow up with James Hochrein, MD. Schedule an appointment as soon as possible for a visit in 2 weeks.   Specialty:  Cardiology   Contact information:   [address], [address] [place]  [address] [phone]        The results of significant diagnostics from this hospitalization (including imaging, microbiology, ancillary and laboratory) are listed below for reference.    Significant Diagnostic Studies: Dg Chest 2 View  12/15/2013   CLINICAL DATA:  Shortness of breath, chest pain, COPD  EXAM: CHEST  2 VIEW  COMPARISON:  08/25/2013  FINDINGS: Hyperinflation noted compatible with background COPD/ emphysema. No focal pneumonia, edema, effusion or pneumothorax. Trachea midline. Normal heart size and vascularity.  IMPRESSION: Chronic COPD/ emphysema.  No superimposed acute process   Electronically Signed   By: Trevor  Shick M.D.   On: 12/15/2013 23:01    Microbiology: Recent Results (from the past 240 hour(s))  CULTURE, BLOOD (ROUTINE X 2)     Status: None   Collection Time    12/15/13 11:05 PM      Result Value Range Status   Specimen Description BLOOD RIGHT ANTECUBITAL   Final   Special Requests BOTTLES DRAWN AEROBIC AND ANAEROBIC 3CC   Final   Culture  Setup Time     Final   Value: 12/16/2013 03:06     Performed at [facility]   Culture     Final   Value: NO GROWTH 5 DAYS     Performed at [facility]   Report Status 12/22/2013 FINAL   Final  CULTURE, BLOOD (ROUTINE X 2)     Status: None   Collection Time    12/15/13 11:10 PM      Result Value Range Status   Specimen Description BLOOD RIGHT FOREARM   Final   Special Requests BOTTLES DRAWN AEROBIC ONLY 4CC   Final   Culture  Setup Time     Final   Value: 12/16/2013 03:06     Performed at [facility]   Culture     Final   Value: NO GROWTH 5 DAYS     Performed at [facility]   Report Status 12/22/2013 FINAL   Final  RESPIRATORY VIRUS PANEL     Status: Abnormal   Collection Time    12/16/13  3:09 AM      Result Value Range Status   Source - RVPAN NOSE   Final   Respiratory Syncytial Virus A NOT DETECTED   Final     Respiratory Syncytial Virus B NOT DETECTED   Final    Influenza A DETECTED (*)  Final   Influenza B NOT DETECTED   Final   Parainfluenza 1 NOT DETECTED   Final   Parainfluenza 2 NOT DETECTED   Final   Parainfluenza 3 NOT DETECTED   Final   Metapneumovirus NOT DETECTED   Final   Rhinovirus NOT DETECTED   Final   Adenovirus NOT DETECTED   Final   Influenza A H1 NOT DETECTED   Final   Influenza A H3 NOT DETECTED   Final   Comment: (NOTE)           Normal Reference Range for each Analyte: NOT DETECTED     Testing performed using the Luminex xTAG Respiratory Viral Panel test     kit.     This test was developed and its performance characteristics determined     by [employer]. It has not been cleared or approved by the [place]     Food and Drug Administration. This test is used for clinical purposes.     It should not be regarded as investigational or for research. This     laboratory is certified under the [place] (CLIA) as qualified to perform high complexity     clinical laboratory testing.     Performed at [facility] SCREENING     Status: None   Collection Time    12/16/13  4:31 AM      Result Value Range Status   MRSA by PCR NEGATIVE  NEGATIVE Final   Comment:            The GeneXpert MRSA Assay (FDA     approved for NASAL specimens     only), is one component of a     comprehensive MRSA colonization     surveillance program. It is not     intended to diagnose MRSA     infection nor to guide or     monitor treatment for     MRSA infections.  RESPIRATORY VIRUS PANEL     Status: Abnormal   Collection Time    12/17/13  8:40 PM      Result Value Range Status   Source - RVPAN NASAL SWAB   Corrected   Comment: CORRECTED ON 12/30 AT 1827: PREVIOUSLY REPORTED AS NASAL SWAB   Respiratory Syncytial Virus A NOT DETECTED   Final   Respiratory Syncytial Virus B NOT DETECTED   Final   Influenza A DETECTED (*)  Final   Influenza B NOT DETECTED   Final   Parainfluenza  1 NOT DETECTED   Final   Parainfluenza 2 NOT DETECTED   Final   Parainfluenza 3 NOT DETECTED   Final   Metapneumovirus NOT DETECTED   Final   Rhinovirus NOT DETECTED   Final   Adenovirus NOT DETECTED   Final   Influenza A H1 NOT DETECTED   Final   Influenza A H3 DETECTED (*)  Final   Comment: (NOTE)           Normal Reference Range for each Analyte: NOT DETECTED     Testing performed using the Luminex xTAG Respiratory Viral Panel test     kit.     This test was developed and its performance characteristics determined     by [employer]. It has not been cleared or approved by the [place]  Food and Drug Administration. This test is used for clinical purposes.     It should not be regarded as investigational or for research. This     laboratory is certified under the [place] (CLIA) as qualified to perform high complexity     clinical laboratory testing.     Performed at [facility]: Basic Metabolic Panel:  Recent Labs Lab 12/22/13 0450 12/23/13 0946 12/24/13 0417 12/25/13 0346  NA 144 140 143 142  K 4.6 4.4 4.4 4.6  CL 100 95* 97 95*  CO2 35* 38* 39* 37*  GLUCOSE 193* 276* 110* 167*  BUN _0 CREATININE 0.78 0.79 0.75 0.80  CALCIUM 9.2 8.9 8.9 9.1   Liver Function Tests: No results found for this basename: AST, ALT, ALKPHOS, BILITOT, PROT, ALBUMIN,  in the last 168 hours No results found for this basename: LIPASE, AMYLASE,  in the last 168 hours No results found for this basename: AMMONIA,  in the last 168 hours CBC: No results found for this basename: WBC, NEUTROABS, HGB, HCT, MCV, PLT,  in the last 168 hours Cardiac Enzymes: No results found for this basename: CKTOTAL, CKMB, CKMBINDEX, TROPONINI,  in the last 168 hours BNP: BNP (last 3 results)  Recent Labs  02/07/13 1025 12/15/13 2200  PROBNP 192.9* 78.6   CBG:  Recent Labs Lab 12/24/13 1133 12/24/13 1633 12/24/13 2110  12/25/13 0632 12/25/13 1146  GLUCAP 279* 129* 185* 88 160*       Signed:  Mariabelen Pressly M.D. [facility] 12/25/2013, 12:34 PM

## 2013-12-25 NOTE — Progress Notes (Signed)
Patient given AVS, discharge instructions and medicationlist with prescriptions. All questions were answered and [facility] with home [facility]. Will discharge home as ordered. Nickayla Mcinnis, Randall AnKristin Jessup   rN

## 2013-12-26 ENCOUNTER — Inpatient Hospital Stay (HOSPITAL_COMMUNITY)
Admission: EM | Admit: 2013-12-26 | Discharge: 2013-12-29 | Disposition: A | Discharge status: Home Health | Payer: Medicare Other | Source: Home / Self Care | Attending: Internal Medicine | Admitting: Internal Medicine

## 2013-12-26 ENCOUNTER — Encounter (HOSPITAL_COMMUNITY): Payer: Self-pay | Admitting: Emergency Medicine

## 2013-12-26 ENCOUNTER — Emergency Department (HOSPITAL_COMMUNITY): Payer: Medicare Other

## 2013-12-26 DIAGNOSIS — I2510 Atherosclerotic heart disease of native coronary artery without angina pectoris: Secondary | ICD-10-CM

## 2013-12-26 DIAGNOSIS — K219 Gastro-esophageal reflux disease without esophagitis: Secondary | ICD-10-CM

## 2013-12-26 DIAGNOSIS — IMO0002 Reserved for concepts with insufficient information to code with codable children: Secondary | ICD-10-CM

## 2013-12-26 DIAGNOSIS — J9600 Acute respiratory failure, unspecified whether with hypoxia or hypercapnia: Secondary | ICD-10-CM

## 2013-12-26 DIAGNOSIS — Z9981 Dependence on supplemental oxygen: Secondary | ICD-10-CM

## 2013-12-26 DIAGNOSIS — I10 Essential (primary) hypertension: Secondary | ICD-10-CM | POA: Diagnosis present

## 2013-12-26 DIAGNOSIS — J449 Chronic obstructive pulmonary disease, unspecified: Secondary | ICD-10-CM

## 2013-12-26 DIAGNOSIS — Z9861 Coronary angioplasty status: Secondary | ICD-10-CM

## 2013-12-26 DIAGNOSIS — Z8249 Family history of ischemic heart disease and other diseases of the circulatory system: Secondary | ICD-10-CM

## 2013-12-26 DIAGNOSIS — J9621 Acute and chronic respiratory failure with hypoxia: Secondary | ICD-10-CM | POA: Diagnosis present

## 2013-12-26 DIAGNOSIS — E785 Hyperlipidemia, unspecified: Secondary | ICD-10-CM

## 2013-12-26 DIAGNOSIS — B370 Candidal stomatitis: Secondary | ICD-10-CM

## 2013-12-26 DIAGNOSIS — Z7982 Long term (current) use of aspirin: Secondary | ICD-10-CM

## 2013-12-26 DIAGNOSIS — E872 Acidosis, unspecified: Secondary | ICD-10-CM | POA: Diagnosis present

## 2013-12-26 DIAGNOSIS — J441 Chronic obstructive pulmonary disease with (acute) exacerbation: Secondary | ICD-10-CM | POA: Diagnosis present

## 2013-12-26 DIAGNOSIS — F17200 Nicotine dependence, unspecified, uncomplicated: Secondary | ICD-10-CM | POA: Diagnosis present

## 2013-12-26 DIAGNOSIS — J111 Influenza due to unidentified influenza virus with other respiratory manifestations: Secondary | ICD-10-CM

## 2013-12-26 DIAGNOSIS — N50812 Left testicular pain: Secondary | ICD-10-CM

## 2013-12-26 DIAGNOSIS — R509 Fever, unspecified: Secondary | ICD-10-CM

## 2013-12-26 DIAGNOSIS — J101 Influenza due to other identified influenza virus with other respiratory manifestations: Secondary | ICD-10-CM

## 2013-12-26 DIAGNOSIS — J9620 Acute and chronic respiratory failure, unspecified whether with hypoxia or hypercapnia: Secondary | ICD-10-CM

## 2013-12-26 DIAGNOSIS — J9601 Acute respiratory failure with hypoxia: Secondary | ICD-10-CM

## 2013-12-26 DIAGNOSIS — R079 Chest pain, unspecified: Secondary | ICD-10-CM

## 2013-12-26 HISTORY — DX: Unspecified asthma, uncomplicated: J45.909

## 2013-12-26 HISTORY — DX: Cerebral infarction, unspecified: I63.9

## 2013-12-26 HISTORY — DX: Gastro-esophageal reflux disease without esophagitis: K21.9

## 2013-12-26 HISTORY — DX: Osteomyelitis, unspecified: M86.9

## 2013-12-26 HISTORY — DX: Pneumonia, unspecified organism: J18.9

## 2013-12-26 HISTORY — DX: Acute myocardial infarction, unspecified: I21.9

## 2013-12-26 LAB — POCT I-STAT 3, ART BLOOD GAS (G3+)
Acid-Base Excess: 13 mmol/L — ABNORMAL HIGH (ref 0.0–2.0)
Bicarbonate: 42.4 mEq/L — ABNORMAL HIGH (ref 20.0–24.0)
O2 Saturation: 100 %
PCO2 ART: 71.4 mmHg — AB (ref 35.0–45.0)
PO2 ART: 218 mmHg — AB (ref 80.0–100.0)
Patient temperature: 98
TCO2: 45 mmol/L (ref 0–100)
pH, Arterial: 7.38 (ref 7.350–7.450)

## 2013-12-26 LAB — PROTIME-INR
INR: 0.8 (ref 0.00–1.49)
Prothrombin Time: 11 seconds — ABNORMAL LOW (ref 11.6–15.2)

## 2013-12-26 LAB — CBC
HCT: 41.7 % (ref 39.0–52.0)
HEMOGLOBIN: 13.7 g/dL (ref 13.0–17.0)
MCH: 29.3 pg (ref 26.0–34.0)
MCHC: 32.9 g/dL (ref 30.0–36.0)
MCV: 89.3 fL (ref 78.0–100.0)
Platelets: 156 10*3/uL (ref 150–400)
RBC: 4.67 MIL/uL (ref 4.22–5.81)
RDW: 13.8 % (ref 11.5–15.5)
WBC: 11.1 10*3/uL — AB (ref 4.0–10.5)

## 2013-12-26 LAB — COMPREHENSIVE METABOLIC PANEL
ALBUMIN: 3.5 g/dL (ref 3.5–5.2)
ALK PHOS: 50 U/L (ref 39–117)
ALT: 35 U/L (ref 0–53)
AST: 17 U/L (ref 0–37)
BUN: 18 mg/dL (ref 6–23)
CALCIUM: 8.9 mg/dL (ref 8.4–10.5)
CO2: 36 mEq/L — ABNORMAL HIGH (ref 19–32)
CREATININE: 0.78 mg/dL (ref 0.50–1.35)
Chloride: 95 mEq/L — ABNORMAL LOW (ref 96–112)
GFR calc Af Amer: >90 mL/min (ref >90)
GFR calc non Af Amer: >90 mL/min (ref >90)
GLUCOSE: 139 mg/dL — AB (ref 70–99)
Potassium: 4.3 mEq/L (ref 3.7–5.3)
Sodium: 144 mEq/L (ref 137–147)
Total Protein: 6.3 g/dL (ref 6.0–8.3)

## 2013-12-26 LAB — CREATININE, SERUM: CREATININE: 0.74 mg/dL (ref 0.50–1.35)

## 2013-12-26 LAB — CBC WITH DIFFERENTIAL/PLATELET
Basophils Absolute: 0 10*3/uL (ref 0.0–0.1)
Basophils Relative: 0 % (ref 0–1)
EOS ABS: 0 10*3/uL (ref 0.0–0.7)
EOS PCT: 0 % (ref 0–5)
HCT: 45.7 % (ref 39.0–52.0)
HEMOGLOBIN: 15.4 g/dL (ref 13.0–17.0)
LYMPHS ABS: 1.7 10*3/uL (ref 0.7–4.0)
Lymphocytes Relative: 13 % (ref 12–46)
MCH: 30.3 pg (ref 26.0–34.0)
MCHC: 33.7 g/dL (ref 30.0–36.0)
MCV: 90 fL (ref 78.0–100.0)
MONOS PCT: 7 % (ref 3–12)
Monocytes Absolute: 0.9 10*3/uL (ref 0.1–1.0)
Neutro Abs: 10.7 10*3/uL — ABNORMAL HIGH (ref 1.7–7.7)
Neutrophils Relative %: 80 % — ABNORMAL HIGH (ref 43–77)
Platelets: 160 10*3/uL (ref 150–400)
RBC: 5.08 MIL/uL (ref 4.22–5.81)
RDW: 13.8 % (ref 11.5–15.5)
WBC: 13.3 10*3/uL — ABNORMAL HIGH (ref 4.0–10.5)

## 2013-12-26 LAB — D-DIMER, QUANTITATIVE (NOT AT ARMC): D DIMER QUANT: 0.32 ug{FEU}/mL (ref 0.00–0.48)

## 2013-12-26 LAB — TROPONIN I: Troponin I: <0.3 ng/mL (ref <0.30)

## 2013-12-26 LAB — PRO B NATRIURETIC PEPTIDE: Pro B Natriuretic peptide (BNP): 100.5 pg/mL (ref 0–125)

## 2013-12-26 LAB — GLUCOSE, CAPILLARY: Glucose-Capillary: 179 mg/dL — ABNORMAL HIGH (ref 70–99)

## 2013-12-26 MED ORDER — IPRATROPIUM-ALBUTEROL 0.5-2.5 (3) MG/3ML IN SOLN
3.0000 mL | RESPIRATORY_TRACT | Status: DC
  Administered 2013-12-27 (×4): 3 mL via RESPIRATORY_TRACT
  Filled 2013-12-26 (×16): qty 3

## 2013-12-26 MED ORDER — IPRATROPIUM BROMIDE 0.02 % IN SOLN
0.5000 mg | Freq: Once | RESPIRATORY_TRACT | Status: AC
  Administered 2013-12-26: 0.5 mg via RESPIRATORY_TRACT

## 2013-12-26 MED ORDER — FLUTICASONE PROPIONATE 50 MCG/ACT NA SUSP
2.0000 | Freq: Two times a day (BID) | NASAL | Status: DC
  Administered 2013-12-26 – 2013-12-29 (×6): 2 via NASAL
  Filled 2013-12-26: qty 16

## 2013-12-26 MED ORDER — SODIUM CHLORIDE 0.9 % IJ SOLN
3.0000 mL | Freq: Two times a day (BID) | INTRAMUSCULAR | Status: DC
  Administered 2013-12-26 – 2013-12-28 (×6): 3 mL via INTRAVENOUS

## 2013-12-26 MED ORDER — ONDANSETRON HCL 4 MG/2ML IJ SOLN
4.0000 mg | Freq: Once | INTRAMUSCULAR | Status: AC
  Administered 2013-12-26: 4 mg via INTRAVENOUS
  Filled 2013-12-26: qty 2

## 2013-12-26 MED ORDER — OXYMETAZOLINE HCL 0.05 % NA SOLN
1.0000 | Freq: Two times a day (BID) | NASAL | Status: DC
  Administered 2013-12-26 – 2013-12-29 (×6): 1 via NASAL
  Filled 2013-12-26: qty 15

## 2013-12-26 MED ORDER — LEVOFLOXACIN IN D5W 750 MG/150ML IV SOLN
750.0000 mg | INTRAVENOUS | Status: DC
  Administered 2013-12-26 – 2013-12-28 (×3): 750 mg via INTRAVENOUS
  Filled 2013-12-26 (×4): qty 150

## 2013-12-26 MED ORDER — IPRATROPIUM-ALBUTEROL 0.5-2.5 (3) MG/3ML IN SOLN
3.0000 mL | Freq: Four times a day (QID) | RESPIRATORY_TRACT | Status: DC
  Administered 2013-12-26: 3 mL via RESPIRATORY_TRACT
  Filled 2013-12-26: qty 3

## 2013-12-26 MED ORDER — PANTOPRAZOLE SODIUM 40 MG PO TBEC
40.0000 mg | DELAYED_RELEASE_TABLET | Freq: Two times a day (BID) | ORAL | Status: DC
  Administered 2013-12-27 – 2013-12-29 (×5): 40 mg via ORAL
  Filled 2013-12-26 (×5): qty 1

## 2013-12-26 MED ORDER — HYDROCODONE-ACETAMINOPHEN 5-325 MG PO TABS
1.0000 | ORAL_TABLET | ORAL | Status: DC | PRN
  Administered 2013-12-26 – 2013-12-29 (×11): 1 via ORAL
  Filled 2013-12-26 (×11): qty 1

## 2013-12-26 MED ORDER — BUDESONIDE 0.25 MG/2ML IN SUSP
0.2500 mg | Freq: Two times a day (BID) | RESPIRATORY_TRACT | Status: DC
  Administered 2013-12-26 – 2013-12-29 (×7): 0.25 mg via RESPIRATORY_TRACT
  Filled 2013-12-26 (×9): qty 2

## 2013-12-26 MED ORDER — ASPIRIN 325 MG PO TABS
650.0000 mg | ORAL_TABLET | Freq: Every morning | ORAL | Status: DC
  Administered 2013-12-26 – 2013-12-29 (×4): 650 mg via ORAL
  Filled 2013-12-26 (×4): qty 2

## 2013-12-26 MED ORDER — TERBUTALINE SULFATE 2.5 MG PO TABS
2.5000 mg | ORAL_TABLET | Freq: Four times a day (QID) | ORAL | Status: DC
  Administered 2013-12-26 – 2013-12-29 (×12): 2.5 mg via ORAL
  Filled 2013-12-26 (×22): qty 1

## 2013-12-26 MED ORDER — PREDNISONE 20 MG PO TABS
40.0000 mg | ORAL_TABLET | Freq: Every day | ORAL | Status: AC
  Administered 2013-12-26 – 2013-12-28 (×3): 40 mg via ORAL
  Filled 2013-12-26 (×3): qty 2

## 2013-12-26 MED ORDER — FUROSEMIDE 20 MG PO TABS
20.0000 mg | ORAL_TABLET | Freq: Every morning | ORAL | Status: DC
  Administered 2013-12-26 – 2013-12-29 (×4): 20 mg via ORAL
  Filled 2013-12-26 (×4): qty 1

## 2013-12-26 MED ORDER — METOPROLOL TARTRATE 50 MG PO TABS
50.0000 mg | ORAL_TABLET | Freq: Two times a day (BID) | ORAL | Status: DC
  Administered 2013-12-26 – 2013-12-29 (×7): 50 mg via ORAL
  Filled 2013-12-26: qty 2
  Filled 2013-12-26 (×7): qty 1

## 2013-12-26 MED ORDER — IPRATROPIUM BROMIDE 0.02 % IN SOLN
0.5000 mg | Freq: Once | RESPIRATORY_TRACT | Status: AC
  Administered 2013-12-26: 0.5 mg via RESPIRATORY_TRACT
  Filled 2013-12-26: qty 2.5

## 2013-12-26 MED ORDER — GUAIFENESIN ER 600 MG PO TB12
1200.0000 mg | ORAL_TABLET | Freq: Two times a day (BID) | ORAL | Status: DC
  Administered 2013-12-26 – 2013-12-29 (×7): 1200 mg via ORAL
  Filled 2013-12-26 (×9): qty 2

## 2013-12-26 MED ORDER — SALINE SPRAY 0.65 % NA SOLN
1.0000 | Freq: Four times a day (QID) | NASAL | Status: DC
  Administered 2013-12-26 – 2013-12-29 (×11): 1 via NASAL
  Filled 2013-12-26: qty 44

## 2013-12-26 MED ORDER — TRAZODONE HCL 50 MG PO TABS
50.0000 mg | ORAL_TABLET | Freq: Every evening | ORAL | Status: DC | PRN
  Administered 2013-12-26: 50 mg via ORAL
  Filled 2013-12-26: qty 1

## 2013-12-26 MED ORDER — ALBUTEROL SULFATE (2.5 MG/3ML) 0.083% IN NEBU
2.5000 mg | INHALATION_SOLUTION | Freq: Four times a day (QID) | RESPIRATORY_TRACT | Status: DC
  Administered 2013-12-26: 2.5 mg via RESPIRATORY_TRACT
  Filled 2013-12-26: qty 3

## 2013-12-26 MED ORDER — IPRATROPIUM BROMIDE 0.02 % IN SOLN: 0.5000 mg | Freq: Four times a day (QID) | RESPIRATORY_TRACT | Status: DC

## 2013-12-26 MED ORDER — ALBUTEROL SULFATE (2.5 MG/3ML) 0.083% IN NEBU
2.5000 mg | INHALATION_SOLUTION | RESPIRATORY_TRACT | Status: DC | PRN
  Administered 2013-12-26 – 2013-12-29 (×3): 2.5 mg via RESPIRATORY_TRACT
  Filled 2013-12-26 (×2): qty 3

## 2013-12-26 MED ORDER — METHYLPREDNISOLONE SODIUM SUCC 125 MG IJ SOLR
125.0000 mg | Freq: Once | INTRAMUSCULAR | Status: AC
  Administered 2013-12-26: 125 mg via INTRAVENOUS

## 2013-12-26 MED ORDER — FLUCONAZOLE 100 MG PO TABS
100.0000 mg | ORAL_TABLET | Freq: Every day | ORAL | Status: DC
  Administered 2013-12-27 – 2013-12-29 (×3): 100 mg via ORAL
  Filled 2013-12-26 (×3): qty 1

## 2013-12-26 MED ORDER — ALBUTEROL SULFATE (2.5 MG/3ML) 0.083% IN NEBU
5.0000 mg | INHALATION_SOLUTION | Freq: Once | RESPIRATORY_TRACT | Status: AC
  Administered 2013-12-26: 5 mg via RESPIRATORY_TRACT
  Filled 2013-12-26: qty 6

## 2013-12-26 MED ORDER — MORPHINE SULFATE 4 MG/ML IJ SOLN
4.0000 mg | Freq: Once | INTRAMUSCULAR | Status: AC
  Administered 2013-12-26: 4 mg via INTRAVENOUS
  Filled 2013-12-26: qty 1

## 2013-12-26 MED ORDER — CLOPIDOGREL BISULFATE 75 MG PO TABS
75.0000 mg | ORAL_TABLET | Freq: Every day | ORAL | Status: DC
  Administered 2013-12-26 – 2013-12-29 (×4): 75 mg via ORAL
  Filled 2013-12-26 (×4): qty 1

## 2013-12-26 MED ORDER — PANTOPRAZOLE SODIUM 40 MG PO TBEC
40.0000 mg | DELAYED_RELEASE_TABLET | Freq: Every day | ORAL | Status: DC
  Administered 2013-12-26 (×2): 40 mg via ORAL
  Filled 2013-12-26 (×2): qty 1

## 2013-12-26 MED ORDER — ALBUTEROL (5 MG/ML) CONTINUOUS INHALATION SOLN
10.0000 mg/h | INHALATION_SOLUTION | Freq: Once | RESPIRATORY_TRACT | Status: AC
  Administered 2013-12-26: 10 mg/h via RESPIRATORY_TRACT

## 2013-12-26 MED ORDER — FLUCONAZOLE 200 MG PO TABS
200.0000 mg | ORAL_TABLET | Freq: Once | ORAL | Status: AC
Start: 2013-12-26 — End: 2013-12-26
  Administered 2013-12-26: 200 mg via ORAL
  Filled 2013-12-26: qty 1

## 2013-12-26 MED ORDER — HEPARIN SODIUM (PORCINE) 5000 UNIT/ML IJ SOLN
5000.0000 [IU] | Freq: Three times a day (TID) | INTRAMUSCULAR | Status: DC
  Administered 2013-12-26 – 2013-12-29 (×10): 5000 [IU] via SUBCUTANEOUS
  Filled 2013-12-26 (×13): qty 1

## 2013-12-26 NOTE — ED Notes (Signed)
Pt working on continuous neb tx.  Breathing appears much easier.

## 2013-12-26 NOTE — H&P (Signed)
[facility] History and Physical  [name] UJW:[id number] DOB: Oct 02, 1951    PCP:   No PCP Per Patient   Chief Complaint: shortness of breath after discharging home for 2 hours.  HPI: [name] is an 63 y.o. male with hx of severe COPD, on home oxygen and steroids, hx of CAD, s/p cardiac stent, just admitted to the hospital for COPD exacerbation and influenza, discharged to home yesterday on tapering steroid, just finished his Tamiflu, and having home oxygen with home nebs, returned to the ER with increased shortness of breath after being at home for 2 hours.  He was given IV steroid in the ER along with an hour neb, and felt markedly better.  He has normal Cr, and WBC was 13K, CXR showed no infiltrate.  Hospitalist was asked to admit him for respiratory failure.  Rewiew of Systems:  Constitutional: Negative for malaise, fever and chills. No significant weight loss or weight gain Eyes: Negative for eye pain, redness and discharge, diplopia, visual changes, or flashes of light. ENMT: Negative for ear pain, hoarseness, nasal congestion, sinus pressure and sore throat. No headaches; tinnitus, drooling, or problem swallowing. Cardiovascular: Negative for chest pain, palpitations, diaphoresis,  and peripheral edema. ; No orthopnea, PND Respiratory: Negative for cough, hemoptysis, wheezing and stridor. No pleuritic chestpain. Gastrointestinal: Negative for nausea, vomiting, diarrhea, constipation, abdominal pain, melena, blood in stool, hematemesis, jaundice and rectal bleeding.    Genitourinary: Negative for frequency, dysuria, incontinence,flank pain and hematuria; Musculoskeletal: Negative for back pain and neck pain. Negative for swelling and trauma.;  Skin: . Negative for pruritus, rash, abrasions, bruising and skin lesion.; ulcerations Neuro: Negative for headache, lightheadedness and neck stiffness. Negative for weakness, altered level of consciousness , altered mental status,  extremity weakness, burning feet, involuntary movement, seizure and syncope.  Psych: negative for anxiety, depression, insomnia, tearfulness, panic attacks, hallucinations, paranoia, suicidal or homicidal ideation    Past Medical History  Diagnosis Date  . COPD (chronic obstructive pulmonary disease)   . CAD (coronary artery disease)   . Hypertension   . Hyperlipidemia   . Osteomyelitis   . Shortness of breath     Past Surgical History  Procedure Laterality Date  . Appendectomy      Medications:  HOME MEDS: Prior to Admission medications   Medication Sig Start Date End Date Taking? Authorizing Provider  aspirin 325 MG tablet Take 650 mg by mouth every morning.     Historical Provider, MD  aspirin-sod bicarb-citric acid (ALKA-SELTZER) 325 MG TBEF tablet Take 325 mg by mouth every 6 (six) hours as needed (for heartburn).    Historical Provider, MD  budesonide (PULMICORT) 0.25 MG/2ML nebulizer solution Take 2 mLs (0.25 mg total) by nebulization 2 (two) times daily. 12/25/13 12/25/14  Rodolph Bong, MD  clopidogrel (PLAVIX) 75 MG tablet Take 1 tablet (75 mg total) by mouth daily. 12/25/13   Rodolph Bong, MD  furosemide (LASIX) 20 MG tablet Take 1 tablet (20 mg total) by mouth every morning. 12/25/13   Rodolph Bong, MD  guaiFENesin (MUCINEX) 600 MG 12 hr tablet Take 2 tablets (1,200 mg total) by mouth 2 (two) times daily. Take for 4 days the stop. 12/25/13 12/29/13  Rodolph Bong, MD  HYDROcodone-acetaminophen (NORCO/VICODIN) 5-325 MG per tablet Take 1 tablet by mouth every 4 (four) hours as needed for moderate pain. 12/25/13   Rodolph Bong, MD  ipratropium-albuterol (DUONEB) 0.5-2.5 (3) MG/3ML SOLN Take 3 mLs by nebulization every 4 (four)  hours as needed. Use every 4 hours x 4 days then use every 2-4 hours as needed. 12/25/13   Rodolph Bong, MD  metoprolol (LOPRESSOR) 50 MG tablet Take 1 tablet (50 mg total) by mouth 2 (two) times daily. 10/19/13   Leslye Peer, MD   pantoprazole (PROTONIX) 40 MG tablet Take 1 tablet (40 mg total) by mouth daily at 12 noon. 12/25/13   Rodolph Bong, MD  predniSONE (DELTASONE) 20 MG tablet Take 3 tablets (60 mg total) by mouth daily. Take 3 tablets ( 60mg ) daily x 2 days, then 2 tablets (40mg ) daily x 3 days, then 1 tablet (20mg ) daily as previously taken. 12/25/13   Rodolph Bong, MD  traZODone (DESYREL) 50 MG tablet Take 1 tablet (50 mg total) by mouth at bedtime as needed for sleep. 12/25/13   Rodolph Bong, MD     Allergies:  Allergies  Allergen Reactions  . Lipitor [Atorvastatin Calcium] Other (See Comments)    Muscle aches    Social History:   reports that he has been smoking Cigarettes.  He has a 12 pack-year smoking history. He has never used smokeless tobacco. He reports that he does not drink alcohol or use illicit drugs.  Family History: Family History  Problem Relation Age of Onset  . Coronary artery disease    . Hypertension       Physical Exam: Filed Vitals:   12/26/13 0152 12/26/13 0219 12/26/13 0332 12/26/13 0408  BP: 154/87  163/88 136/79  Pulse: 129 128 121 116  Temp: 98 F (36.7 C)     Resp: 13 23 15 17   SpO2: 99% 96% 96% 98%   Blood pressure 136/79, pulse 116, temperature 98 F (36.7 C), resp. rate 17, SpO2 98.00%.  GEN:  Pleasant  patient lying in the stretcher in no acute distress; but clearly dyspneic and tachypneic. cooperative with exam. PSYCH:  alert and oriented x4; does not appear anxious or depressed; affect is appropriate. HEENT: Mucous membranes pink and anicteric; PERRLA; EOM intact; no cervical lymphadenopathy nor thyromegaly or carotid bruit; no JVD; There were no stridor. Neck is very supple. Breasts:: Not examined CHEST WALL: No tenderness CHEST: Normal respiration, decreased BS, with both insp and exp wheezing. HEART: Regular rate and rhythm.  There are no murmur, rub, or gallops.   BACK: No kyphosis or scoliosis; no CVA tenderness ABDOMEN: soft and  non-tender; no masses, no organomegaly, normal abdominal bowel sounds; no pannus; no intertriginous candida. There is no rebound and no distention. Rectal Exam: Not done EXTREMITIES: No bone or joint deformity; age-appropriate arthropathy of the hands and knees; no edema; no ulcerations.  There is no calf tenderness. Genitalia: not examined PULSES: 2+ and symmetric SKIN: Normal hydration no rash or ulceration CNS: Cranial nerves 2-12 grossly intact no focal lateralizing neurologic deficit.  Speech is fluent; uvula elevated with phonation, facial symmetry and tongue midline. DTR are normal bilaterally, cerebella exam is intact, barbinski is negative and strengths are equaled bilaterally.  No sensory loss.   Labs on Admission:  Basic Metabolic Panel:  Recent Labs Lab 12/22/13 0450 12/23/13 0946 12/24/13 0417 12/25/13 0346 12/26/13 0203  NA 144 140 143 142 144  K 4.6 4.4 4.4 4.6 4.3  CL 100 95* 97 95* 95*  CO2 35* 38* 39* 37* 36*  GLUCOSE 193* 276* 110* 167* 139*  BUN 21 21 21 20 18   CREATININE 0.78 0.79 0.75 0.80 0.78  CALCIUM 9.2 8.9 8.9 9.1 8.9   Liver  Function Tests:  Recent Labs Lab 12/26/13 0203  AST 17  ALT 35  ALKPHOS 50  BILITOT <0.2*  PROT 6.3  ALBUMIN 3.5   No results found for this basename: LIPASE, AMYLASE,  in the last 168 hours No results found for this basename: AMMONIA,  in the last 168 hours CBC:  Recent Labs Lab 12/26/13 0203  WBC 13.3*  NEUTROABS 10.7*  HGB 15.4  HCT 45.7  MCV 90.0  PLT 160   Cardiac Enzymes:  Recent Labs Lab 12/26/13 0203  TROPONINI <0.30    CBG:  Recent Labs Lab 12/24/13 1133 12/24/13 1633 12/24/13 2110 12/25/13 0632 12/25/13 1146  GLUCAP 279* 129* 185* 88 160*     Radiological Exams on Admission: Dg Chest Portable 1 View  12/26/2013   CLINICAL DATA:  Shortness of breath  EXAM: PORTABLE CHEST - 1 VIEW  COMPARISON:  12/15/2013 in  FINDINGS: Hyperinflated lungs with apical emphysematous change. Chronic  interstitial coarsening at the bases. No acute infiltrate, edema, effusion, or pneumothorax. Normal heart size.  IMPRESSION: COPD without superimposed pneumonia or edema.   Electronically Signed   By: Tiburcio PeaJonathan  Watts M.D.   On: 12/26/2013 02:27   Assessment/Plan Present on Admission:  . COPD with acute exacerbation . Influenza A . HYPERTENSION . Acute respiratory failure with hypoxia . CAD . COPD exacerbation  PLAN:  Patient is readmitted for respiratory failure, after discharged home for 2 hours.  Please consult Dr Delton CoombesByrum for further recommendation, as he is tenuous and has little reserve.  He has been on steroid, and IV probably won't help anymore than oral steroids, so will give him 40mg  per day of Prednisone.  He should get antibiotics for this COPD exacerbation as well and will give IV Levaquin.  I will continue with his nebs, but would like to add oral terbutaline and will start with Brethrine at 2.5mg  every 6 hours.  Consider using Diamox also to lower his bicarb.  Although theophyllin has not been used anymore because of the low therapeutic index, he may be a candidate because of his severe condition.  Will defer all to Dr Delton CoombesByrum.   [facility]. Thank you for allowing me to participate in his care.  Other plans as per orders.  Code Status: FULL Unk LightningODE.   Aaralynn Shepheard, MD. [facility] Pager [phone] 7pm to 7am.  12/26/2013, 5:06 AM

## 2013-12-26 NOTE — ED Provider Notes (Signed)
CSN: [id number]     Arrival date & time 12/26/13  0140 History   First MD Initiated Contact with Patient 12/26/13 0158     Chief Complaint  Patient presents with  . Shortness of Breath   (Consider location/radiation/quality/duration/timing/severity/associated sxs/prior Treatment) HPI Comments: Patient arrives in respiratory distress. He was discharged less than 12 hours ago after 11 day hospitalization for COPD exacerbation and influenza. Reports shortness of breath began again around 7 PM. He uses albuterol nebulizer without relief. He endorses cough, congestion, wheezing and shortness of breath. Denies any chest pain or fever. He did see some streaks of blood in his cough today. She completed antibiotics and Tamiflu while hospitalized. He was discharged on steroids and nebulizers.  The history is provided by the patient and the EMS personnel. The history is limited by the condition of the patient.    Past Medical History  Diagnosis Date  . COPD (chronic obstructive pulmonary disease)   . CAD (coronary artery disease)   . Hypertension   . Hyperlipidemia   . Osteomyelitis   . Shortness of breath    Past Surgical History  Procedure Laterality Date  . Appendectomy     Family History  Problem Relation Age of Onset  . Coronary artery disease    . Hypertension     History  Substance Use Topics  . Smoking status: Current Some Day Smoker -- 0.30 packs/day for 40 years    Types: Cigarettes  . Smokeless tobacco: Never Used     Comment: approx .5 cigarettes per week   . Alcohol Use: No    Review of Systems  Constitutional: Positive for fatigue. Negative for fever.  Respiratory: Positive for cough and shortness of breath. Negative for chest tightness.   Cardiovascular: Negative for chest pain and leg swelling.  Gastrointestinal: Negative for nausea, vomiting and abdominal pain.  Genitourinary: Negative for dysuria and hematuria.  Musculoskeletal: Negative for back pain.  Skin:  Negative for rash.  Neurological: Positive for weakness. Negative for dizziness and headaches.  A complete 10 system review of systems was obtained and all systems are negative except as noted in the HPI and PMH.    Allergies  Lipitor  Home Medications   Current Outpatient Rx  Name  Route  Sig  Dispense  Refill  . aspirin 325 MG tablet   Oral   Take 650 mg by mouth every morning.          Marland Kitchen. aspirin-sod bicarb-citric acid (ALKA-SELTZER) 325 MG TBEF tablet   Oral   Take 325 mg by mouth every 6 (six) hours as needed (for heartburn).         . budesonide (PULMICORT) 0.25 MG/2ML nebulizer solution   Nebulization   Take 2 mLs (0.25 mg total) by nebulization 2 (two) times daily.   360 mL   0     Dx Code 496   . clopidogrel (PLAVIX) 75 MG tablet   Oral   Take 1 tablet (75 mg total) by mouth daily.   31 tablet   0   . furosemide (LASIX) 20 MG tablet   Oral   Take 1 tablet (20 mg total) by mouth every morning.   30 tablet   0   . guaiFENesin (MUCINEX) 600 MG 12 hr tablet   Oral   Take 2 tablets (1,200 mg total) by mouth 2 (two) times daily. Take for 4 days the stop.   8 tablet   0   . HYDROcodone-acetaminophen (NORCO/VICODIN) 5-325 MG  per tablet   Oral   Take 1 tablet by mouth every 4 (four) hours as needed for moderate pain.   20 tablet   0   . ipratropium-albuterol (DUONEB) 0.5-2.5 (3) MG/3ML SOLN   Nebulization   Take 3 mLs by nebulization every 4 (four) hours as needed. Use every 4 hours x 4 days then use every 2-4 hours as needed.   360 mL   0   . metoprolol (LOPRESSOR) 50 MG tablet   Oral   Take 1 tablet (50 mg total) by mouth 2 (two) times daily.   60 tablet   6   . pantoprazole (PROTONIX) 40 MG tablet   Oral   Take 1 tablet (40 mg total) by mouth daily at 12 noon.   30 tablet   0   . predniSONE (DELTASONE) 20 MG tablet   Oral   Take 3 tablets (60 mg total) by mouth daily. Take 3 tablets ( 60mg ) daily x 2 days, then 2 tablets (40mg ) daily x 3  days, then 1 tablet (20mg ) daily as previously taken.   30 tablet   0   . traZODone (DESYREL) 50 MG tablet   Oral   Take 1 tablet (50 mg total) by mouth at bedtime as needed for sleep.   20 tablet   0    BP 159/67  Pulse 120  Temp(Src) 98 F (36.7 C)  Resp 20  SpO2 95% Physical Exam  Constitutional: He is oriented to person, place, and time. He appears well-developed and well-nourished. He appears distressed.  HENT:  Head: Normocephalic and atraumatic.  Mouth/Throat: Oropharynx is clear and moist. No oropharyngeal exudate.  Eyes: Conjunctivae and EOM are normal. Pupils are equal, round, and reactive to light.  Neck: Normal range of motion. Neck supple.  Cardiovascular: Normal rate, regular rhythm and normal heart sounds.   No murmur heard. Pulmonary/Chest: He is in respiratory distress. He has wheezes.  Decreased breath sounds throughout with scattered expiratory wheezing. Tachypnea with accessory muscle use.  Abdominal: There is no tenderness. There is no rebound and no guarding.  Musculoskeletal: Normal range of motion. He exhibits no edema and no tenderness.  Neurological: He is alert and oriented to person, place, and time. No cranial nerve deficit. He exhibits normal muscle tone. Coordination normal.  Skin: Skin is warm.    ED Course  Procedures (including critical care time) Labs Review Labs Reviewed  CBC WITH DIFFERENTIAL - Abnormal; Notable for the following:    WBC 13.3 (*)    Neutrophils Relative % 80 (*)    Neutro Abs 10.7 (*)    All other components within normal limits  COMPREHENSIVE METABOLIC PANEL - Abnormal; Notable for the following:    Chloride 95 (*)    CO2 36 (*)    Glucose, Bld 139 (*)    Total Bilirubin <0.2 (*)    All other components within normal limits  PROTIME-INR - Abnormal; Notable for the following:    Prothrombin Time 11.0 (*)    All other components within normal limits  POCT I-STAT 3, BLOOD GAS (G3+) - Abnormal; Notable for the  following:    pCO2 arterial 71.4 (*)    pO2, Arterial 218.0 (*)    Bicarbonate 42.4 (*)    Acid-Base Excess 13.0 (*)    All other components within normal limits  TROPONIN I  PRO B NATRIURETIC PEPTIDE  D-DIMER, QUANTITATIVE   Imaging Review Dg Chest Portable 1 View  12/26/2013   CLINICAL DATA:  Shortness of breath  EXAM: PORTABLE CHEST - 1 VIEW  COMPARISON:  12/15/2013 in  FINDINGS: Hyperinflated lungs with apical emphysematous change. Chronic interstitial coarsening at the bases. No acute infiltrate, edema, effusion, or pneumothorax. Normal heart size.  IMPRESSION: COPD without superimposed pneumonia or edema.   Electronically Signed   By: Tiburcio Pea M.D.   On: 12/26/2013 02:27    EKG Interpretation    Date/Time:  Wednesday December 26 2013 01:57:36 EST Ventricular Rate:  128 PR Interval:  147 QRS Duration: 93 QT Interval:  299 QTC Calculation: 436 R Axis:   154 Text Interpretation:  Sinus tachycardia LAE, consider biatrial enlargement Right axis deviation anterior infarct Nonspecific T abnormalities, lateral leads No significant change was found Confirmed by Nymir Ringler  MD, Kip Kautzman ([id number]) on 12/26/2013 2:00:34 AM            MDM   1. COPD exacerbation   2. Acute respiratory failure with hypoxia   3. COPD (chronic obstructive pulmonary disease)   4. Influenza A    Respiratory distress with recent hospitalization for COPD exacerbation and influenza. Patient with tachypnea and increased work of breathing on exam. Decreased air exchange with scattered wheezing.  Patient is given IV steroids, nebulizers. He recently completed a course of antibiotics. Chest x-ray is negative for acute infiltrate.   Sinus tachycardia an EKG. D-dimer was checked given patient's history of tachycardia and reported hemoptysis. This was negative.  ABG shows compensated respiratory acidosis.  Patient will need readmission for respiratory failure and COPD exacerbation.   CRITICAL CARE Performed  by: Glynn Octave Total critical care time: 30 Critical care time was exclusive of separately billable procedures and treating other patients. Critical care was necessary to treat or prevent imminent or life-threatening deterioration. Critical care was time spent personally by me on the following activities: development of treatment plan with patient and/or surrogate as well as nursing, discussions with consultants, evaluation of patient's response to treatment, examination of patient, obtaining history from patient or surrogate, ordering and performing treatments and interventions, ordering and review of laboratory studies, ordering and review of radiographic studies, pulse oximetry and re-evaluation of patient's condition.   Glynn Octave, MD 12/26/13 [phone]

## 2013-12-26 NOTE — ED Notes (Signed)
DISCHARGED 1/6 @ 1600 after 11 days in hospital for same issues.  Began increased dyspnea around 1900; tried breathing treatments without relief.

## 2013-12-26 NOTE — Progress Notes (Signed)
[facility] PROGRESS NOTE  [name]:[id number] DOB: 11/03/1951 DOA: 12/26/2013 PCP: No PCP Per Patient  Assessment/Plan: Acute respiratory failure in the setting of COPD exacerbation  continue o2 via [place]. Currently stable on 3L Oral prednisone. Scheduled nebs IV Levaquin  CXR negative for infiltrate -patient reports being irregular on home o2. Will need to be on it full time  discussed with his pulmonologist Dr Lamonte Sakai . Will obtain formal pulmonary consult to further assist in management. Added terbutaline by admitting hospitalist. Monitor o2 sat  CAD continue ASA, plavix.    HTN  BP stable   TOBACCO ABUSE  counseled strongly on cessation  Code Status: full Family Communication: none at bedside Disposition Plan: currently inpatient   Consultants:  pulmonary  Procedures:  NONE  Antibiotics:  levaquin  HPI/Subjective: Patient seen and examiend this am. Reports some improvementin his dyspnea. still  Objective: Filed Vitals:   12/26/13 1214  BP: 143/72  Pulse: 90  Temp: 97.6 F (36.4 C)  Resp: 24    Intake/Output Summary (Last 24 hours) at 12/26/13 1517 Last data filed at 12/26/13 0808  Gross per 24 hour  Intake      0 ml  Output    550 ml  Net   -550 ml   There were no vitals filed for this visit.  Exam:   General:  Elderly male lying in bed in NAD   HEENT: no pallor, moist mucosa   chest: b/l diffuse wheezing, no crackles   CVS: NS1&S2, no murmurs, rubs or gallop  abd: soft, NT, ND, BS+  Ext: Warm, no edema  CNS: AAOX3   Data Reviewed: Basic Metabolic Panel:  Recent Labs Lab 12/22/13 0450 12/23/13 0946 12/24/13 0417 12/25/13 0346 12/26/13 0203 12/26/13 1352  NA 144 140 143 142 144  --   K 4.6 4.4 4.4 4.6 4.3  --   CL 100 95* 97 95* 95*  --   CO2 35* 38* 39* 37* 36*  --   GLUCOSE 193* 276* 110* 167* 139*  --   BUN _0 --   CREATININE 0.78 0.79 0.75 0.80 0.78 0.74  CALCIUM 9.2 8.9 8.9 9.1 8.9  --     Liver Function Tests:  Recent Labs Lab 12/26/13 0203  AST 17  ALT 35  ALKPHOS 50  BILITOT <0.2*  PROT 6.3  ALBUMIN 3.5   No results found for this basename: LIPASE, AMYLASE,  in the last 168 hours No results found for this basename: AMMONIA,  in the last 168 hours CBC:  Recent Labs Lab 12/26/13 0203 12/26/13 1352  WBC 13.3* 11.1*  NEUTROABS 10.7*  --   HGB 15.4 13.7  HCT 45.7 41.7  MCV 90.0 89.3  PLT 160 156   Cardiac Enzymes:  Recent Labs Lab 12/26/13 0203  TROPONINI <0.30   BNP (last 3 results)  Recent Labs  02/07/13 1025 12/15/13 2200 12/26/13 0203  PROBNP 192.9* 78.6 100.5   CBG:  Recent Labs Lab 12/24/13 1133 12/24/13 1633 12/24/13 2110 12/25/13 0632 12/25/13 1146  GLUCAP 279* 129* 185* 88 160*    Recent Results (from the past 240 hour(s))  RESPIRATORY VIRUS PANEL     Status: Abnormal   Collection Time    12/17/13  8:40 PM      Result Value Range Status   Source - [facility]AN NASAL SWAB   Corrected   Comment: CORRECTED ON 12/30 AT 1827: PREVIOUSLY REPORTED AS NASAL SWAB   Respiratory Syncytial Virus  A NOT DETECTED   Final   Respiratory Syncytial Virus B NOT DETECTED   Final   Influenza A DETECTED (*)  Final   Influenza B NOT DETECTED   Final   Parainfluenza 1 NOT DETECTED   Final   Parainfluenza 2 NOT DETECTED   Final   Parainfluenza 3 NOT DETECTED   Final   Metapneumovirus NOT DETECTED   Final   Rhinovirus NOT DETECTED   Final   Adenovirus NOT DETECTED   Final   Influenza A H1 NOT DETECTED   Final   Influenza A H3 DETECTED (*)  Final   Comment: (NOTE)           Normal Reference Range for each Analyte: NOT DETECTED     Testing performed using the Luminex xTAG Respiratory Viral Panel test     kit.     This test was developed and its performance characteristics determined     by [facility]. It has not been cleared or approved by the [place]     Food and Drug Administration. This test is used for clinical purposes.     It should  not be regarded as investigational or for research. This     laboratory is certified under the [place] (CLIA) as qualified to perform high complexity     clinical laboratory testing.     Performed at [facility]     Studies: Dg Chest Portable 1 View  12/26/2013   CLINICAL DATA:  Shortness of breath  EXAM: PORTABLE CHEST - 1 VIEW  COMPARISON:  12/15/2013 in  FINDINGS: Hyperinflated lungs with apical emphysematous change. Chronic interstitial coarsening at the bases. No acute infiltrate, edema, effusion, or pneumothorax. Normal heart size.  IMPRESSION: COPD without superimposed pneumonia or edema.   Electronically Signed   By: Jorje Guild M.D.   On: 12/26/2013 02:27    Scheduled Meds: . albuterol  2.5 mg Nebulization Q6H  . aspirin  650 mg Oral q morning - 10a  . budesonide  0.25 mg Nebulization BID  . clopidogrel  75 mg Oral Q breakfast  . furosemide  20 mg Oral q morning - 10a  . guaiFENesin  1,200 mg Oral BID  . heparin  5,000 Units Subcutaneous Q8H  . levofloxacin (LEVAQUIN) IV  750 mg Intravenous Q24H  . metoprolol  50 mg Oral BID  . pantoprazole  40 mg Oral Q1200  . predniSONE  40 mg Oral QAC breakfast  . sodium chloride  3 mL Intravenous Q12H  . terbutaline  2.5 mg Oral Q6H   Continuous Infusions:     Time spent: [place] Pager [phone] If 7PM-7AM, please contact night-coverage at [email or web address], password [facility] 12/26/2013, 3:17 PM  LOS: 0 days

## 2013-12-26 NOTE — Consult Note (Signed)
Name: [name] MRN: [id number] DOB: [birth year]    ADMISSION DATE:  12/26/2013 CONSULTATION DATE:  12/26/2013  REFERRING MD :  Theda Belfast Dhungel  CHIEF COMPLAINT: Short of breath  BRIEF PATIENT DESCRIPTION:  63 yo male discharged home on 1/6 after tx for influenza and AECOPD, was readmitted on 1/7 for AECOPD.  PCCM asked to assess.  SIGNIFICANT EVENTS: 1/6 D/c home 1/7 Readmit  STUDIES:    LINES / TUBES:   CULTURES: Influenza A 12/29: Positive  Urine strep 1/7>>>  ANTIBIOTICS: levaquin 1/7>>>  HISTORY OF PRESENT ILLNESS:   63 year old male f/b byrum for COPD stage IV, class D, pred dependant (FEV1 28%, still active smoker) just admitted to the hospital for COPD exacerbation and influenza, discharged to home 1/6, did not feel like he had improved much. Sent home on tapering steroid, just finished his Tamiflu, and having home oxygen with home nebs, returned to the ER with increased shortness of breath after being at home for 2 hours. Of note prior to d/c he was noting sore throat, painful swallowing, increased PND, worsening reflux, worsening gastric distention and worsening SOB. He was given IV steroid in the ER along with an hour neb, and felt markedly better. Hospitalist was asked to admit him for respiratory failure, pccm asked to eval .   PAST MEDICAL HISTORY :  Past Medical History  Diagnosis Date  . COPD (chronic obstructive pulmonary disease)   . CAD (coronary artery disease)   . Hypertension   . Hyperlipidemia   . Osteomyelitis   . Shortness of breath    Past Surgical History  Procedure Laterality Date  . Appendectomy     Prior to Admission medications   Medication Sig Start Date End Date Taking? Authorizing Provider  aspirin 325 MG tablet Take 650 mg by mouth every morning.    Yes Historical Provider, MD  aspirin-sod bicarb-citric acid (ALKA-SELTZER) 325 MG TBEF tablet Take 325 mg by mouth every 6 (six) hours as needed (for heartburn).   Yes Historical  Provider, MD  budesonide (PULMICORT) 0.25 MG/2ML nebulizer solution Take 2 mLs (0.25 mg total) by nebulization 2 (two) times daily. 12/25/13 12/25/14 Yes Rodolph Bong, MD  clopidogrel (PLAVIX) 75 MG tablet Take 1 tablet (75 mg total) by mouth daily. 12/25/13  Yes Rodolph Bong, MD  furosemide (LASIX) 20 MG tablet Take 1 tablet (20 mg total) by mouth every morning. 12/25/13  Yes Rodolph Bong, MD  guaiFENesin (MUCINEX) 600 MG 12 hr tablet Take 2 tablets (1,200 mg total) by mouth 2 (two) times daily. Take for 4 days the stop. 12/25/13 12/29/13 Yes Rodolph Bong, MD  HYDROcodone-acetaminophen (NORCO/VICODIN) 5-325 MG per tablet Take 1 tablet by mouth every 4 (four) hours as needed for moderate pain. 12/25/13  Yes Rodolph Bong, MD  ipratropium-albuterol (DUONEB) 0.5-2.5 (3) MG/3ML SOLN Take 3 mLs by nebulization every 4 (four) hours as needed. Use every 4 hours x 4 days then use every 2-4 hours as needed. 12/25/13  Yes Rodolph Bong, MD  metoprolol (LOPRESSOR) 50 MG tablet Take 1 tablet (50 mg total) by mouth 2 (two) times daily. 10/19/13  Yes Leslye Peer, MD  pantoprazole (PROTONIX) 40 MG tablet Take 1 tablet (40 mg total) by mouth daily at 12 noon. 12/25/13  Yes Rodolph Bong, MD  predniSONE (DELTASONE) 20 MG tablet Take 3 tablets (60 mg total) by mouth daily. Take 3 tablets ( 60mg ) daily x 2 days, then 2 tablets (40mg )  daily x 3 days, then 1 tablet (20mg ) daily as previously taken. 12/25/13  Yes Rodolph Bonganiel V Thompson, MD  traZODone (DESYREL) 50 MG tablet Take 1 tablet (50 mg total) by mouth at bedtime as needed for sleep. 12/25/13   Rodolph Bonganiel V Thompson, MD   Allergies  Allergen Reactions  . Lipitor [Atorvastatin Calcium] Other (See Comments)    Muscle aches    FAMILY HISTORY:  Family History  Problem Relation Age of Onset  . Coronary artery disease    . Hypertension     SOCIAL HISTORY:  reports that he has been smoking Cigarettes.  He has a 12 pack-year smoking history. He has never  used smokeless tobacco. He reports that he does not drink alcohol or use illicit drugs.  Review of Systems:   Bolds are positive  Constitutional: weight loss, gain, night sweats, Fevers, chills, fatigue .  HEENT: headaches, Sore throat, sneezing, nasal congestion, post nasal drip, Difficulty swallowing, Tooth/dental problems, visual complaints visual changes, ear ache CV:  chest pain, radiates: ,Orthopnea, PND, swelling in lower extremities, dizziness, palpitations, syncope.  GI  heartburn, indigestion, abdominal pain, nausea, vomiting, diarrhea, change in bowel habits, loss of appetite, bloody stools.  Resp: cough, productive: white but did have one episode w/ blood and one w/ black sputum  , hemoptysis, dyspnea, chest pain, pleuritic.  Skin: rash or itching or icterus GU: dysuria, change in color of urine, urgency or frequency. flank pain, hematuria  MS: joint pain or swelling. decreased range of motion  Psych: change in mood or affect. depression or anxiety.  Neuro: difficulty with speech, weakness, numbness, ataxia    SUBJECTIVE:  No distress currently  VITAL SIGNS: Temp:  [97.6 F (36.4 C)-98 F (36.7 C)] 97.6 F (36.4 C) (01/07 1214) Pulse Rate:  [76-129] 91 (01/07 1500) Resp:  [13-28] 24 (01/07 1214) BP: (112-163)/(67-88) 126/82 mmHg (01/07 1500) SpO2:  [94 %-99 %] 94 % (01/07 1456)  PHYSICAL EXAMINATION: General:  Chronically ill appearing white male, no acute distress.  Neuro:  Awake, alert, no focal def  HEENT:  Tongue w/ caked white discoloration, posterior pharynx erythremic w/ white discoloration  Neck:  Faint upper airway noises at end of prolonged expiration  Cardiovascular:  rrr Lungs:  Prolonged exhale, no rales  Abdomen:  Soft, non-tender + bowel sounds  Musculoskeletal:  Intact  Skin:  LE edema    Recent Labs Lab 12/24/13 0417 12/25/13 0346 12/26/13 0203 12/26/13 1352  NA 143 142 144  --   K 4.4 4.6 4.3  --   CL 97 95* 95*  --   CO2 39* 37* 36*  --     BUN 21 20 18   --   CREATININE 0.75 0.80 0.78 0.74  GLUCOSE 110* 167* 139*  --     Recent Labs Lab 12/26/13 0203 12/26/13 1352  HGB 15.4 13.7  HCT 45.7 41.7  WBC 13.3* 11.1*  PLT 160 156   Dg Chest Portable 1 View  12/26/2013   CLINICAL DATA:  Shortness of breath  EXAM: PORTABLE CHEST - 1 VIEW  COMPARISON:  12/15/2013 in  FINDINGS: Hyperinflated lungs with apical emphysematous change. Chronic interstitial coarsening at the bases. No acute infiltrate, edema, effusion, or pneumothorax. Normal heart size.  IMPRESSION: COPD without superimposed pneumonia or edema.   Electronically Signed   By: Tiburcio PeaJonathan  Watts M.D.   On: 12/26/2013 02:27  agree   ASSESSMENT / PLAN: Acute on chronic respiratory failure in setting of AECOPD. Think prolonged exacerbation related to thrush infection,  post-nasal gtt and possibly reflux.  Recs: Add nasal hygiene regimen Change ppi to BID rx thrush Taper steroids Continue BDs Agree w/ lasix.  Wean fio2, likely needs 24/7 Add flutter  Pulmonary and Critical Care Medicine [facility] Pager: [phone]  12/26/2013, 3:29 PM  Reviewed above, examined pt, and agree with assessment/plan.  63 yo male with hx of COPD and recent therapy for influenza.  He is readmitted with similar findings, but now complicated by sinus disease, thrush, and reflux.  Will treat with diflucan for thrush, augment sinus regimen, and increase PPI.  Agree with continuing levaquin, diuresis, prednisone, and nebulizer therapy.  Adjust oxygen to keep SpO2 > 90%.  F/u CXR as needed.  Coralyn Helling, MD [facility] Pulmonary/Critical Care 12/26/2013, 4:36 PM Pager:  [phone] After 3pm call: [phone]

## 2013-12-26 NOTE — ED Notes (Addendum)
Ambulated pt on 3L 02 per MD request, pt's 02 sats read 88%. Pt's HR increased to 137. Pt diaphoretic.

## 2013-12-26 NOTE — ED Notes (Signed)
Pt states that he is feeling better after the last breathing treatment. Pt still has inspiratory wheezes upon ausculation, but does not appear in distress. Pt currently sitting on side of bed.

## 2013-12-27 DIAGNOSIS — J441 Chronic obstructive pulmonary disease with (acute) exacerbation: Principal | ICD-10-CM

## 2013-12-27 LAB — GLUCOSE, CAPILLARY
GLUCOSE-CAPILLARY: 142 mg/dL — AB (ref 70–99)
Glucose-Capillary: 111 mg/dL — ABNORMAL HIGH (ref 70–99)
Glucose-Capillary: 144 mg/dL — ABNORMAL HIGH (ref 70–99)
Glucose-Capillary: 160 mg/dL — ABNORMAL HIGH (ref 70–99)

## 2013-12-27 MED ORDER — MAGIC MOUTHWASH W/LIDOCAINE
5.0000 mL | Freq: Four times a day (QID) | ORAL | Status: DC | PRN
  Administered 2013-12-28 (×3): 5 mL via ORAL
  Filled 2013-12-27 (×2): qty 5

## 2013-12-27 MED ORDER — BIOTENE DRY MOUTH MT LIQD
15.0000 mL | Freq: Two times a day (BID) | OROMUCOSAL | Status: DC
  Administered 2013-12-27 – 2013-12-29 (×5): 15 mL via OROMUCOSAL

## 2013-12-27 MED ORDER — IPRATROPIUM BROMIDE 0.02 % IN SOLN
0.5000 mg | Freq: Four times a day (QID) | RESPIRATORY_TRACT | Status: DC
  Administered 2013-12-27 – 2013-12-28 (×3): 0.5 mg via RESPIRATORY_TRACT
  Filled 2013-12-27 (×3): qty 2.5

## 2013-12-27 MED ORDER — ALBUTEROL SULFATE (2.5 MG/3ML) 0.083% IN NEBU
2.5000 mg | INHALATION_SOLUTION | RESPIRATORY_TRACT | Status: DC
  Administered 2013-12-27 – 2013-12-28 (×5): 2.5 mg via RESPIRATORY_TRACT
  Filled 2013-12-27 (×5): qty 3

## 2013-12-27 NOTE — Progress Notes (Signed)
UR Completed Aianna Fahs Graves-Bigelow, RN,BSN [phone]  

## 2013-12-27 NOTE — Progress Notes (Signed)
[facility] Progress Note [place] TEAM [address]   JOSHUAJAMES MOEHRING UMP:[id number] DOB: June 16, 1951 DOA: 12/26/2013 PCP: No PCP Per Patient  Admit HPI / Brief Narrative: 63 yo male discharged home on 1/6 after tx for influenza and AECOPD, was readmitted on 1/7 for AECOPD after spending ~2hrs at home.    HPI/Subjective: Pt states that he still feels SOB, though slightly better than at admit.  He denies cp, n/v, or abdom pain.    Assessment/Plan:  Acute respiratory failure in the setting of acute COPD exacerbation  Refractory/recurring nature possibly related to thrush infection, post-nasal gtt and possibly reflux - CXR negative for infiltrate - patient admits to being inconsistent in use of home O2 - needs to be on it full time - Pulm following in consultation   Hx of CAD s/p stenting  continue ASA, plavix - asymptomatic at this time    HTN  BP stable   TOBACCO ABUSE  counseled strongly on cessation   Code Status: FULL Family Communication: no family present at time of exam Disposition Plan: PT/OT - ultimate plan will be for d/c home   Consultants: Pulmonary  Procedures: none  Antibiotics: Diflucan 1/07 >> Levaquin 1/02 >>  DVT prophylaxis: SQ heparin   Objective: Blood pressure 129/77, pulse 85, temperature 97.9 F (36.6 C), temperature source Oral, resp. rate 18, height $RemoveBe'5\' 11"'SSElmFgXJ$  (1.803 m), weight 91.672 kg (202 lb 1.6 oz), SpO2 96.00%.  Intake/Output Summary (Last 24 hours) at 12/27/13 1228 Last data filed at 12/27/13 0847  Gross per 24 hour  Intake    870 ml  Output   3150 ml  Net  -2280 ml   Exam: General: No acute respiratory distress sitting on side of bed  Lungs: distant bs th/o - mild exp wheeze diffusely - no focal crackles  Cardiovascular: very distant heart sounds - regular rate without murmur gallop or rub Abdomen: Nontender, nondistended, soft, bowel sounds positive, no rebound, no ascites, no appreciable mass Extremities: No significant cyanosis,  clubbing;  1+ edema bilateral lower extremities  Data Reviewed: Basic Metabolic Panel:  Recent Labs Lab 12/22/13 0450 12/23/13 0946 12/24/13 0417 12/25/13 0346 12/26/13 0203 12/26/13 1352  NA 144 140 143 142 144  --   K 4.6 4.4 4.4 4.6 4.3  --   CL 100 95* 97 95* 95*  --   CO2 35* 38* 39* 37* 36*  --   GLUCOSE 193* 276* 110* 167* 139*  --   BUN $Re'21 21 21 20 18  'fbZ$ --   CREATININE 0.78 0.79 0.75 0.80 0.78 0.74  CALCIUM 9.2 8.9 8.9 9.1 8.9  --    Liver Function Tests:  Recent Labs Lab 12/26/13 0203  AST 17  ALT 35  ALKPHOS 50  BILITOT <0.2*  PROT 6.3  ALBUMIN 3.5   CBC:  Recent Labs Lab 12/26/13 0203 12/26/13 1352  WBC 13.3* 11.1*  NEUTROABS 10.7*  --   HGB 15.4 13.7  HCT 45.7 41.7  MCV 90.0 89.3  PLT 160 156   BNP (last 3 results)  Recent Labs  02/07/13 1025 12/15/13 2200 12/26/13 0203  PROBNP 192.9* 78.6 100.5   CBG:  Recent Labs Lab 12/25/13 0632 12/25/13 1146 12/26/13 2042 12/27/13 0745 12/27/13 1203  GLUCAP 88 160* 179* 111* 144*    Recent Results (from the past 240 hour(s))  RESPIRATORY VIRUS PANEL     Status: Abnormal   Collection Time    12/17/13  8:40 PM      Result Value Range  Status   Source - RVPAN NASAL SWAB   Corrected   Comment: CORRECTED ON 12/30 AT 1827: PREVIOUSLY REPORTED AS NASAL SWAB   Respiratory Syncytial Virus A NOT DETECTED   Final   Respiratory Syncytial Virus B NOT DETECTED   Final   Influenza A DETECTED (*)  Final   Influenza B NOT DETECTED   Final   Parainfluenza 1 NOT DETECTED   Final   Parainfluenza 2 NOT DETECTED   Final   Parainfluenza 3 NOT DETECTED   Final   Metapneumovirus NOT DETECTED   Final   Rhinovirus NOT DETECTED   Final   Adenovirus NOT DETECTED   Final   Influenza A H1 NOT DETECTED   Final   Influenza A H3 DETECTED (*)  Final   Comment: (NOTE)           Normal Reference Range for each Analyte: NOT DETECTED     Testing performed using the Luminex xTAG Respiratory Viral Panel test     kit.      This test was developed and its performance characteristics determined     by [facility]. It has not been cleared or approved by the [place]     Food and Drug Administration. This test is used for clinical purposes.     It should not be regarded as investigational or for research. This     laboratory is certified under the [place] (CLIA) as qualified to perform high complexity     clinical laboratory testing.     Performed at [facility]     Studies:  Recent x-ray studies have been reviewed in detail by the Attending Physician  Scheduled Meds:  Scheduled Meds: . antiseptic oral rinse  15 mL Mouth Rinse BID  . aspirin  650 mg Oral q morning - 10a  . budesonide  0.25 mg Nebulization BID  . clopidogrel  75 mg Oral Q breakfast  . fluconazole  100 mg Oral Daily  . fluticasone  2 spray Each Nare BID  . furosemide  20 mg Oral q morning - 10a  . guaiFENesin  1,200 mg Oral BID  . heparin  5,000 Units Subcutaneous Q8H  . ipratropium-albuterol  3 mL Nebulization Q4H  . levofloxacin (LEVAQUIN) IV  750 mg Intravenous Q24H  . metoprolol  50 mg Oral BID  . oxymetazoline  1 spray Each Nare BID  . pantoprazole  40 mg Oral BID AC  . predniSONE  40 mg Oral QAC breakfast  . sodium chloride  1 spray Each Nare QID  . sodium chloride  3 mL Intravenous Q12H  . terbutaline  2.5 mg Oral Q6H    Time spent on care of this patient: 35 mins   [place]  [phone] Pager - Text Page per Shea Evans as per below:  On-Call/Text Page:      [email or web address]      password [email or web address]  If 7PM-7AM, please contact night-coverage [email or web address] Password [email or web address] 12/27/2013, 12:28 PM   LOS: 1 day

## 2013-12-27 NOTE — Care Management Note (Unsigned)
    Page 1 of 1   12/27/2013     11:38:28 AM   CARE MANAGEMENT NOTE 12/27/2013  Patient:  [name],Petro G   Account Number:  [id number]  Date Initiated:  12/27/2013  Documentation initiated by:  GRAVES-BIGELOW,Rameen Quinney  Subjective/Objective Assessment:   Pt admitted for Acute respiratory failure in the setting of COPD exacerbation. Pt is on Home 02. Pt lives with daughter.     Action/Plan:   CM will continue to monitor for disposition needs.   Anticipated DC Date:  12/29/2013   Anticipated DC Plan:  HOME W HOME HEALTH SERVICES      DC Planning Services  CM consult      Choice offered to / List presented to:             Status of service:  In process, will continue to follow Medicare Important Message given?   (If response is "NO", the following Medicare IM given date fields will be blank) Date Medicare IM given:   Date Additional Medicare IM given:    Discharge Disposition:    Per UR Regulation:  Reviewed for med. necessity/level of care/duration of stay  If discussed at Long Length of Stay Meetings, dates discussed:    Comments:

## 2013-12-27 NOTE — Progress Notes (Signed)
 PCCM  Name: Chris ProphetUlysses G [name] MRN: [id number] DOB: 12/12/1951    ADMISSION DATE:  12/26/2013 CONSULTATION DATE:  12/26/2013  REFERRING MD :  Theda BelfastNishant Dhungel  CHIEF COMPLAINT: Short of breath  BRIEF PATIENT DESCRIPTION:  63 yo male discharged home on 1/6 after tx for influenza and AECOPD, was readmitted on 1/7 for AECOPD.  PCCM asked to assess.  SIGNIFICANT EVENTS: 1/6 D/c home 1/7 Readmit  STUDIES:    LINES / TUBES:   CULTURES: Influenza A 12/29: Positive  Urine strep 1/7>>>  ANTIBIOTICS: levaquin 1/7>>>  SUBJECTIVE:  Feeling a bit better.  Still with intermittent SOB, cough with thick sputum and mouth pain.   VITAL SIGNS: Temp:  [97.5 F (36.4 C)-97.9 F (36.6 C)] 97.9 F (36.6 C) (01/08 0446) Pulse Rate:  [83-105] 85 (01/08 0446) Resp:  [15-26] 18 (01/08 0446) BP: (116-143)/(71-82) 129/77 mmHg (01/08 1035) SpO2:  [94 %-98 %] 98 % (01/08 0840) Weight:  [202 lb 1.6 oz (91.672 kg)] 202 lb 1.6 oz (91.672 kg) (01/08 0634)  PHYSICAL EXAMINATION: General:  Chronically ill appearing white male, no acute distress.  Neuro:  Awake, alert, no focal def  HEENT:  Tongue w/ caked white discoloration, posterior pharynx erythremic w/ white discoloration  Lungs: resps even non labored on [place], few scattered rhonchi, mild exp wheeze  Cardiovascular:  rrr Abdomen:  Soft, non-tender + bowel sounds  Musculoskeletal:  Intact  Skin:  1+ BLE edema    Recent Labs Lab 12/24/13 0417 12/25/13 0346 12/26/13 0203 12/26/13 1352  NA 143 142 144  --   K 4.4 4.6 4.3  --   CL 97 95* 95*  --   CO2 39* 37* 36*  --   BUN 21 20 18   --   CREATININE 0.75 0.80 0.78 0.74  GLUCOSE 110* 167* 139*  --     Recent Labs Lab 12/26/13 0203 12/26/13 1352  HGB 15.4 13.7  HCT 45.7 41.7  WBC 13.3* 11.1*  PLT 160 156   Dg Chest Portable 1 View  12/26/2013   CLINICAL DATA:  Shortness of breath  EXAM: PORTABLE CHEST - 1 VIEW  COMPARISON:  12/15/2013 in  FINDINGS: Hyperinflated lungs with apical  emphysematous change. Chronic interstitial coarsening at the bases. No acute infiltrate, edema, effusion, or pneumothorax. Normal heart size.  IMPRESSION: COPD without superimposed pneumonia or edema.   Electronically Signed   By: Tiburcio PeaJonathan  Watts M.D.   On: 12/26/2013 02:27    ASSESSMENT / PLAN: Acute on chronic respiratory failure in setting of AECOPD. Think prolonged exacerbation related to thrush infection, post-nasal gtt and possibly reflux.   Recs: Added nasal hygiene regimen 1/7 -- afrin (x4 days), saline, flonase ppi to BID rx thrush - will add magic mouthwash with lido swish and spit 1/8 Cont steroids and taper  Continue BDs -- pulmicort, duoneb Agree w/ lasix.  Wean fio2, likely needs 24/7 Add flutter - discussed pulm hygiene with pt again 1/8  [facility] LLCWHITEHEART,KATHRYN, NP 12/27/2013  10:47 AM Pager: [phone] or [phone]  *Care during the described time interval was provided by me and/or other providers on the critical care team. I have reviewed this patient's available data, including medical history, events of note, physical examination and test results as part of my evaluation.   Dorcas Carrowatrick WrightMD Beeper  [phone]  Cell  [phone]  If no response or cell goes to voicemail, call beeper [phone]

## 2013-12-28 DIAGNOSIS — R079 Chest pain, unspecified: Secondary | ICD-10-CM

## 2013-12-28 LAB — CBC
HEMATOCRIT: 41 % (ref 39.0–52.0)
Hemoglobin: 13.5 g/dL (ref 13.0–17.0)
MCH: 30 pg (ref 26.0–34.0)
MCHC: 32.9 g/dL (ref 30.0–36.0)
MCV: 91.1 fL (ref 78.0–100.0)
Platelets: 151 10*3/uL (ref 150–400)
RBC: 4.5 MIL/uL (ref 4.22–5.81)
RDW: 14.1 % (ref 11.5–15.5)
WBC: 12.4 10*3/uL — ABNORMAL HIGH (ref 4.0–10.5)

## 2013-12-28 LAB — GLUCOSE, CAPILLARY
GLUCOSE-CAPILLARY: 177 mg/dL — AB (ref 70–99)
Glucose-Capillary: 134 mg/dL — ABNORMAL HIGH (ref 70–99)
Glucose-Capillary: 161 mg/dL — ABNORMAL HIGH (ref 70–99)

## 2013-12-28 MED ORDER — IPRATROPIUM BROMIDE 0.02 % IN SOLN
0.5000 mg | Freq: Two times a day (BID) | RESPIRATORY_TRACT | Status: DC
  Administered 2013-12-28 – 2013-12-29 (×2): 0.5 mg via RESPIRATORY_TRACT
  Filled 2013-12-28 (×2): qty 2.5

## 2013-12-28 MED ORDER — FUROSEMIDE 10 MG/ML IJ SOLN
20.0000 mg | Freq: Once | INTRAMUSCULAR | Status: AC
  Administered 2013-12-28: 20 mg via INTRAVENOUS
  Filled 2013-12-28: qty 2

## 2013-12-28 MED ORDER — ARFORMOTEROL TARTRATE 15 MCG/2ML IN NEBU
15.0000 ug | INHALATION_SOLUTION | Freq: Two times a day (BID) | RESPIRATORY_TRACT | Status: DC
  Administered 2013-12-28 – 2013-12-29 (×2): 15 ug via RESPIRATORY_TRACT
  Filled 2013-12-28 (×7): qty 2

## 2013-12-28 NOTE — Progress Notes (Signed)
[place] PCCM  Name: Chris ProphetUlysses G [name] MRN: [id number] DOB: 11/06/1951    ADMISSION DATE:  12/26/2013 CONSULTATION DATE:  12/26/2013  REFERRING MD :  Theda BelfastNishant Dhungel  CHIEF COMPLAINT: Short of breath  BRIEF PATIENT DESCRIPTION:  63 yo male discharged home on 1/6 after tx for influenza and AECOPD, was readmitted on 1/7 for AECOPD.  PCCM asked to assess.  SIGNIFICANT EVENTS: 1/6 D/c home 1/7 Readmit 1/9 Reports improvement in resp status, no distress, ongoing oral thrush  STUDIES:    LINES / TUBES:   CULTURES: Influenza A 12/29: Positive  Urine strep 1/7>>>neg  ANTIBIOTICS: levaquin 1/7>>>  SUBJECTIVE: Modest improvement, no distress.  Reports throat soreness, nasal drainage  VITAL SIGNS: Temp:  [98.1 F (36.7 C)-98.5 F (36.9 C)] 98.1 F (36.7 C) (01/09 0500) Pulse Rate:  [88-97] 88 (01/09 0500) Resp:  [18-20] 20 (01/09 0500) BP: (111-148)/(61-87) 111/61 mmHg (01/09 0500) SpO2:  [96 %-98 %] 98 % (01/09 0721)  PHYSICAL EXAMINATION: General:  Chronically ill appearing white male, no acute distress.  Neuro:  Awake, alert, no focal def  HEENT:  Tongue w/ caked white discoloration, posterior pharynx erythremic w/ white discoloration  Lungs: resps even non labored on [place], few scattered rhonchi, mild exp wheeze  Cardiovascular:  rrr Abdomen:  Soft, non-tender + bowel sounds  Musculoskeletal:  Intact  Skin:  1+ BLE edema    Recent Labs Lab 12/24/13 0417 12/25/13 0346 12/26/13 0203 12/26/13 1352  NA 143 142 144  --   K 4.4 4.6 4.3  --   CL 97 95* 95*  --   CO2 39* 37* 36*  --   BUN 21 20 18   --   CREATININE 0.75 0.80 0.78 0.74  GLUCOSE 110* 167* 139*  --     Recent Labs Lab 12/26/13 0203 12/26/13 1352 12/28/13 0355  HGB 15.4 13.7 13.5  HCT 45.7 41.7 41.0  WBC 13.3* 11.1* 12.4*  PLT 160 156 151   No results found.  ASSESSMENT / PLAN: Acute on chronic respiratory failure in setting of AECOPD. Think prolonged exacerbation related to thrush infection,  post-nasal gtt and possibly reflux.   Recs: -nasal hygiene regimen 1/7 -- afrin (x4 days), saline, flonase -ppi to BID -rx thrush - will add magic mouthwash with lido swish and spit 1/8 -Cont steroids and taper  -change to atrovent bid, brovana bid, bid budesonide, prn alb -on d/c go back to duoneb qid -Agree w/ lasix.  -Wean fio2, likely needs 24/7 -flutter - discussed pulm hygiene with pt  -ensure oral rinse after inhalers, educated patient -pulmonary follow up arranged for discharge -call prn this weekend. ?d/c 1-2 days -needs HH RN   Canary BrimBrandi Ollis, NP-C [facility] Pgr: [phone] or [phone]     *Care during the described time interval was provided by me and/or other providers on the critical care team. I have reviewed this patient's available data, including medical history, events of note, physical examination and test results as part of my evaluation.  Dorcas Carrowatrick WrightMD Beeper  [phone]  Cell  [phone]  If no response or cell goes to voicemail, call beeper [phone]

## 2013-12-28 NOTE — Progress Notes (Signed)
[facility] Progress Note   [name] ZOX:[id number] DOB: [birth year] DOA: 12/26/2013 PCP: [name]  Admit HPI / Brief Narrative: 63 yo male discharged home on 1/6 after tx for influenza and AECOPD, was readmitted on 1/7 for AECOPD after spending ~2hrs at home.    HPI/Subjective: Pt states that he is feeling much better today.  Assessment/Plan:  Acute respiratory failure in the setting of acute COPD exacerbation  Refractory/recurring nature possibly related to thrush infection, post-nasal gtt and possibly reflux - CXR negative for infiltrate - [name] admits to being inconsistent in use of home O2 - needs to be on it full time - Pulm following in consultation   Hx of CAD s/p stenting  continue ASA, plavix - asymptomatic at this time    HTN  BP stable   TOBACCO ABUSE  counseled strongly on cessation   Code Status: FULL Family Communication: Chris family present at time of exam Disposition Plan: DC home in 24-48 hours.  Consultants: Pulmonary  Procedures: none  Antibiotics: Diflucan 1/07 >> Levaquin 1/02 >>  DVT prophylaxis: SQ heparin   Objective: Blood pressure 138/83, pulse 86, temperature 98.1 F (36.7 C), temperature source Oral, resp. rate 18, height 5\' 11"  (1.803 m), weight 91.672 kg (202 lb 1.6 oz), SpO2 95.00%.  Intake/Output Summary (Last 24 hours) at 12/28/13 1402 Last data filed at 12/28/13 1349  Gross per 24 hour  Intake    663 ml  Output   1525 ml  Net   -862 ml   Exam: General: Chris acute respiratory distress sitting on side of bed  Lungs: distant bs th/o - mild exp wheeze diffusely - Chris focal crackles  Cardiovascular: very distant heart sounds - regular rate without murmur gallop or rub Abdomen: Nontender, nondistended, soft, bowel sounds positive, Chris rebound, Chris ascites, Chris appreciable mass Extremities: Chris significant cyanosis, clubbing;  1+ edema bilateral lower extremities  Data Reviewed: Basic Metabolic Panel:  Recent  Labs Lab 12/22/13 0450 12/23/13 0946 12/24/13 0417 12/25/13 0346 12/26/13 0203 12/26/13 1352  NA 144 140 143 142 144  --   K 4.6 4.4 4.4 4.6 4.3  --   CL 100 95* 97 95* 95*  --   CO2 35* 38* 39* 37* 36*  --   GLUCOSE 193* 276* 110* 167* 139*  --   BUN 21 21 21 20 18   --   CREATININE 0.78 0.79 0.75 0.80 0.78 0.74  CALCIUM 9.2 8.9 8.9 9.1 8.9  --    Liver Function Tests:  Recent Labs Lab 12/26/13 0203  AST 17  ALT 35  ALKPHOS 50  BILITOT <0.2*  PROT 6.3  ALBUMIN 3.5   CBC:  Recent Labs Lab 12/26/13 0203 12/26/13 1352 12/28/13 0355  WBC 13.3* 11.1* 12.4*  NEUTROABS 10.7*  --   --   HGB 15.4 13.7 13.5  HCT 45.7 41.7 41.0  MCV 90.0 89.3 91.1  PLT 160 156 151   BNP (last 3 results)  Recent Labs  02/07/13 1025 12/15/13 2200 12/26/13 0203  PROBNP 192.9* 78.6 100.5   CBG:  Recent Labs Lab 12/27/13 1203 12/27/13 1605 12/27/13 2015 12/28/13 0716 12/28/13 1116  GLUCAP 144* 142* 160* 134* 161*    Chris results found for this or any previous visit (from the past 240 hour(s)).    Scheduled Meds:  Scheduled Meds: . antiseptic oral rinse  15 mL Mouth Rinse BID  . arformoterol  15 mcg Nebulization BID  . aspirin  650 mg Oral q  morning - 10a  . budesonide  0.25 mg Nebulization BID  . clopidogrel  75 mg Oral Q breakfast  . fluconazole  100 mg Oral Daily  . fluticasone  2 spray Each Nare BID  . furosemide  20 mg Oral q morning - 10a  . guaiFENesin  1,200 mg Oral BID  . heparin  5,000 Units Subcutaneous Q8H  . ipratropium  0.5 mg Nebulization BID  . levofloxacin (LEVAQUIN) IV  750 mg Intravenous Q24H  . metoprolol  50 mg Oral BID  . oxymetazoline  1 spray Each Nare BID  . pantoprazole  40 mg Oral BID AC  . sodium chloride  1 spray Each Nare QID  . sodium chloride  3 mL Intravenous Q12H  . terbutaline  2.5 mg Oral Q6H    Time spent on care of this [name]: 35 mins   [name] Pager: [phone] [facility] Office   [phone]   If 7PM-7AM, please contact night-coverage [email or web address] Password [email or web address] 12/28/2013, 2:02 PM   LOS: 2 days

## 2013-12-29 MED ORDER — FLUCONAZOLE 100 MG PO TABS: 100.0000 mg | ORAL_TABLET | Freq: Every day | ORAL | Status: DC

## 2013-12-29 MED ORDER — TERBUTALINE SULFATE 2.5 MG PO TABS: 2.5000 mg | ORAL_TABLET | Freq: Four times a day (QID) | ORAL | Status: DC

## 2013-12-29 MED ORDER — FLUTICASONE PROPIONATE 50 MCG/ACT NA SUSP: 2.0000 | Freq: Two times a day (BID) | NASAL | Status: DC

## 2013-12-29 MED ORDER — ARFORMOTEROL TARTRATE 15 MCG/2ML IN NEBU: 15.0000 ug | INHALATION_SOLUTION | Freq: Two times a day (BID) | RESPIRATORY_TRACT | Status: DC

## 2013-12-29 MED ORDER — PREDNISONE 20 MG PO TABS: 20.0000 mg | ORAL_TABLET | Freq: Every day | ORAL | Status: DC

## 2013-12-29 NOTE — Progress Notes (Signed)
   CARE MANAGEMENT NOTE 12/29/2013  Patient:  [name],[name]   Account Number:  [id number]  Date Initiated:  12/27/2013  Documentation initiated by:  GRAVES-BIGELOW,BRENDA  Subjective/Objective Assessment:   Pt admitted for Acute respiratory failure in the setting of COPD exacerbation. Pt is on Home 02. Pt lives with daughter.     Action/Plan:   CM will continue to monitor for disposition needs.   Anticipated DC Date:  12/29/2013   Anticipated DC Plan:  HOME W HOME HEALTH SERVICES      DC Planning Services  CM consult      Choice offered to / List presented to:             Status of service:  Completed, signed off Medicare Important Message given?   (If response is "NO", the following Medicare IM given date fields will be blank) Date Medicare IM given:   Date Additional Medicare IM given:    Discharge Disposition:  HOME/SELF CARE  Per UR Regulation:  Reviewed for med. necessity/level of care/duration of stay  If discussed at Long Length of Stay Meetings, dates discussed:    Comments:  12/29/13 13:00 Cm spoke with pt in room who states his sister will be picking him up.  CM asked pt if sister could help out with med costs and he agrees to ask.  Pt received MATCH in December 2014.  [facility] handout for follow up care and to pursue an orange car as his insurance does not cover medication.  Pt states he understands importance of making an appt on Monday, 12/31/13.  Pt also states he can get his medication from [facility].  No other Needs were communicated.  Freddy JakschSarah Lovinia Snare, BSN, CM, [phone].

## 2013-12-29 NOTE — Discharge Summary (Signed)
Physician Discharge Summary  [name]:[id number] DOB: 1951-03-31 DOA: 12/26/2013  PCP: No PCP Per Patient  Admit date: 12/26/2013 Discharge date: 12/29/2013  Time spent: 45 minutes  Recommendations for Outpatient Follow-up:  -Will be discharged home today. -Advised to follow up with his pulmonologist as scheduled for 01/04/14.   Discharge Diagnoses:  Active Problems:   HYPERTENSION   CAD   Acute respiratory failure with hypoxia   COPD with acute exacerbation   COPD exacerbation   Influenza A   Discharge Condition: Stable and improved  Filed Weights   12/27/13 0634  Weight: 91.672 kg (202 lb 1.6 oz)    History of present illness:  [name] is an 63 y.o. male with hx of severe COPD, on home oxygen and steroids, hx of CAD, s/p cardiac stent, just admitted to the hospital for COPD exacerbation and influenza, discharged to home yesterday on tapering steroid, just finished his Tamiflu, and having home oxygen with home nebs, returned to the ER with increased shortness of breath after being at home for 2 hours. He was given IV steroid in the ER along with an hour neb, and felt markedly better. He has normal Cr, and WBC was 13K, CXR showed no infiltrate. Hospitalist was asked to admit him for respiratory failure.   Hospital Course:   Acute respiratory failure in the setting of acute COPD exacerbation  Refractory/recurring nature possibly related to thrush infection, post-nasal gtt and possibly reflux - CXR negative for infiltrate - patient admits to being inconsistent in use of home O2 - needs to be on it full time - Pulm following in consultation and appreciate their recommendations.  Hx of CAD s/p stenting  continue ASA, plavix - asymptomatic at this time   HTN  BP stable   TOBACCO ABUSE  counseled strongly on cessation    Procedures:  None   Consultations:  Pulmonology  Discharge Instructions  Discharge Orders   Future Appointments Provider Department  Dept Phone   01/04/2014 4:15 PM Julio Sicks, NP [facility] [phone]   01/10/2014 11:30 AM Doris Cheadle, MD [facility] [phone]   01/25/2014 10:15 AM Waymon Budge, MD [facility] [phone]   Future Orders Complete By Expires   Diet - low sodium heart healthy  As directed    Discontinue IV  As directed    Increase activity slowly  As directed        Medication List    STOP taking these medications       budesonide 0.25 MG/2ML nebulizer solution  Commonly known as:  PULMICORT      TAKE these medications       arformoterol 15 MCG/2ML Nebu  Commonly known as:  BROVANA  Take 2 mLs (15 mcg total) by nebulization 2 (two) times daily.     aspirin 325 MG tablet  Take 650 mg by mouth every morning.     aspirin-sod bicarb-citric acid 325 MG Tbef tablet  Commonly known as:  ALKA-SELTZER  Take 325 mg by mouth every 6 (six) hours as needed (for heartburn).     clopidogrel 75 MG tablet  Commonly known as:  PLAVIX  Take 1 tablet (75 mg total) by mouth daily.     fluconazole 100 MG tablet  Commonly known as:  DIFLUCAN  Take 1 tablet (100 mg total) by mouth daily. For 5 more days     fluticasone 50 MCG/ACT nasal spray  Commonly known as:  FLONASE  Place 2 sprays into both nostrils 2 (two) times daily.     furosemide 20 MG tablet  Commonly known as:  LASIX  Take 1 tablet (20 mg total) by mouth every morning.     guaiFENesin 600 MG 12 hr tablet  Commonly known as:  MUCINEX  Take 2 tablets (1,200 mg total) by mouth 2 (two) times daily. Take for 4 days the stop.     HYDROcodone-acetaminophen 5-325 MG per tablet  Commonly known as:  NORCO/VICODIN  Take 1 tablet by mouth every 4 (four) hours as needed for moderate pain.     ipratropium-albuterol 0.5-2.5 (3) MG/3ML Soln  Commonly known as:  DUONEB  Take 3 mLs by nebulization every 4 (four) hours as needed. Use every 4 hours x 4 days then use every 2-4 hours as  needed.     metoprolol 50 MG tablet  Commonly known as:  LOPRESSOR  Take 1 tablet (50 mg total) by mouth 2 (two) times daily.     pantoprazole 40 MG tablet  Commonly known as:  PROTONIX  Take 1 tablet (40 mg total) by mouth daily at 12 noon.     predniSONE 20 MG tablet  Commonly known as:  DELTASONE  Take 1 tablet (20 mg total) by mouth daily with breakfast.     terbutaline 2.5 MG tablet  Commonly known as:  BRETHINE  Take 1 tablet (2.5 mg total) by mouth every 6 (six) hours.     traZODone 50 MG tablet  Commonly known as:  DESYREL  Take 1 tablet (50 mg total) by mouth at bedtime as needed for sleep.       Allergies  Allergen Reactions  . Lipitor [Atorvastatin Calcium] Other (See Comments)    Muscle aches       Follow-up Information   Follow up with PARRETT,TAMMY, NP On 01/04/2014. (Appt at 4:15)    Specialty:  Nurse Practitioner   Contact information:   [address] [place] [address] [phone]        The results of significant diagnostics from this hospitalization (including imaging, microbiology, ancillary and laboratory) are listed below for reference.    Significant Diagnostic Studies: Dg Chest 2 View  12/15/2013   CLINICAL DATA:  Shortness of breath, chest pain, COPD  EXAM: CHEST  2 VIEW  COMPARISON:  08/25/2013  FINDINGS: Hyperinflation noted compatible with background COPD/ emphysema. No focal pneumonia, edema, effusion or pneumothorax. Trachea midline. Normal heart size and vascularity.  IMPRESSION: Chronic COPD/ emphysema.  No superimposed acute process   Electronically Signed   By: Ruel Favorsrevor  Shick M.D.   On: 12/15/2013 23:01   Dg Chest Portable 1 View  12/26/2013   CLINICAL DATA:  Shortness of breath  EXAM: PORTABLE CHEST - 1 VIEW  COMPARISON:  12/15/2013 in  FINDINGS: Hyperinflated lungs with apical emphysematous change. Chronic interstitial coarsening at the bases. No acute infiltrate, edema, effusion, or pneumothorax. Normal heart size.   IMPRESSION: COPD without superimposed pneumonia or edema.   Electronically Signed   By: Tiburcio PeaJonathan  Watts M.D.   On: 12/26/2013 02:27    Microbiology: No results found for this or any previous visit (from the past 240 hour(s)).   Labs: Basic Metabolic Panel:  Recent Labs Lab 12/23/13 0946 12/24/13 0417 12/25/13 0346 12/26/13 0203 12/26/13 1352  NA 140 143 142 144  --   K 4.4 4.4 4.6 4.3  --   CL 95* 97 95* 95*  --   CO2 38* 39* 37* 36*  --  GLUCOSE 276* 110* 167* 139*  --   BUN 21 21 20 18   --   CREATININE 0.79 0.75 0.80 0.78 0.74  CALCIUM 8.9 8.9 9.1 8.9  --    Liver Function Tests:  Recent Labs Lab 12/26/13 0203  AST 17  ALT 35  ALKPHOS 50  BILITOT <0.2*  PROT 6.3  ALBUMIN 3.5   No results found for this basename: LIPASE, AMYLASE,  in the last 168 hours No results found for this basename: AMMONIA,  in the last 168 hours CBC:  Recent Labs Lab 12/26/13 0203 12/26/13 1352 12/28/13 0355  WBC 13.3* 11.1* 12.4*  NEUTROABS 10.7*  --   --   HGB 15.4 13.7 13.5  HCT 45.7 41.7 41.0  MCV 90.0 89.3 91.1  PLT 160 156 151   Cardiac Enzymes:  Recent Labs Lab 12/26/13 0203  TROPONINI <0.30   BNP: BNP (last 3 results)  Recent Labs  02/07/13 1025 12/15/13 2200 12/26/13 0203  PROBNP 192.9* 78.6 100.5   CBG:  Recent Labs Lab 12/27/13 1605 12/27/13 2015 12/28/13 0716 12/28/13 1116 12/28/13 1615  GLUCAP 142* 160* 134* 161* 177*       Signed:  [name],ESTELA  [facility] Pager: [phone] 12/29/2013, 10:34 AM

## 2014-01-04 ENCOUNTER — Inpatient Hospital Stay: Payer: Self-pay | Admitting: Adult Health

## 2014-01-04 ENCOUNTER — Emergency Department (HOSPITAL_COMMUNITY): Payer: Medicare Other

## 2014-01-04 ENCOUNTER — Telehealth: Payer: Self-pay | Admitting: Emergency Medicine

## 2014-01-04 ENCOUNTER — Inpatient Hospital Stay (HOSPITAL_COMMUNITY)
Admission: EM | Admit: 2014-01-04 | Discharge: 2014-01-09 | DRG: 189 | Disposition: A | Discharge status: Home / Self Care | Payer: Medicare Other | Attending: Internal Medicine | Admitting: Internal Medicine

## 2014-01-04 ENCOUNTER — Encounter (HOSPITAL_COMMUNITY): Payer: Self-pay | Admitting: Emergency Medicine

## 2014-01-04 DIAGNOSIS — I10 Essential (primary) hypertension: Secondary | ICD-10-CM

## 2014-01-04 DIAGNOSIS — I252 Old myocardial infarction: Secondary | ICD-10-CM

## 2014-01-04 DIAGNOSIS — J9601 Acute respiratory failure with hypoxia: Secondary | ICD-10-CM

## 2014-01-04 DIAGNOSIS — J45901 Unspecified asthma with (acute) exacerbation: Secondary | ICD-10-CM

## 2014-01-04 DIAGNOSIS — G8929 Other chronic pain: Secondary | ICD-10-CM | POA: Diagnosis present

## 2014-01-04 DIAGNOSIS — Z8249 Family history of ischemic heart disease and other diseases of the circulatory system: Secondary | ICD-10-CM

## 2014-01-04 DIAGNOSIS — Z7902 Long term (current) use of antithrombotics/antiplatelets: Secondary | ICD-10-CM

## 2014-01-04 DIAGNOSIS — Z79899 Other long term (current) drug therapy: Secondary | ICD-10-CM

## 2014-01-04 DIAGNOSIS — J449 Chronic obstructive pulmonary disease, unspecified: Secondary | ICD-10-CM | POA: Diagnosis present

## 2014-01-04 DIAGNOSIS — J9600 Acute respiratory failure, unspecified whether with hypoxia or hypercapnia: Principal | ICD-10-CM

## 2014-01-04 DIAGNOSIS — IMO0002 Reserved for concepts with insufficient information to code with codable children: Secondary | ICD-10-CM

## 2014-01-04 DIAGNOSIS — J9621 Acute and chronic respiratory failure with hypoxia: Secondary | ICD-10-CM | POA: Diagnosis present

## 2014-01-04 DIAGNOSIS — M545 Low back pain, unspecified: Secondary | ICD-10-CM | POA: Diagnosis present

## 2014-01-04 DIAGNOSIS — J441 Chronic obstructive pulmonary disease with (acute) exacerbation: Secondary | ICD-10-CM

## 2014-01-04 DIAGNOSIS — Z8673 Personal history of transient ischemic attack (TIA), and cerebral infarction without residual deficits: Secondary | ICD-10-CM

## 2014-01-04 DIAGNOSIS — Z9089 Acquired absence of other organs: Secondary | ICD-10-CM

## 2014-01-04 DIAGNOSIS — K219 Gastro-esophageal reflux disease without esophagitis: Secondary | ICD-10-CM | POA: Diagnosis present

## 2014-01-04 DIAGNOSIS — E785 Hyperlipidemia, unspecified: Secondary | ICD-10-CM | POA: Diagnosis present

## 2014-01-04 DIAGNOSIS — I2510 Atherosclerotic heart disease of native coronary artery without angina pectoris: Secondary | ICD-10-CM

## 2014-01-04 DIAGNOSIS — Z9861 Coronary angioplasty status: Secondary | ICD-10-CM

## 2014-01-04 DIAGNOSIS — F17200 Nicotine dependence, unspecified, uncomplicated: Secondary | ICD-10-CM

## 2014-01-04 DIAGNOSIS — Z9981 Dependence on supplemental oxygen: Secondary | ICD-10-CM

## 2014-01-04 DIAGNOSIS — R079 Chest pain, unspecified: Secondary | ICD-10-CM

## 2014-01-04 DIAGNOSIS — Z888 Allergy status to other drugs, medicaments and biological substances status: Secondary | ICD-10-CM

## 2014-01-04 DIAGNOSIS — Z87891 Personal history of nicotine dependence: Secondary | ICD-10-CM

## 2014-01-04 DIAGNOSIS — Z7982 Long term (current) use of aspirin: Secondary | ICD-10-CM

## 2014-01-04 LAB — CBC WITH DIFFERENTIAL/PLATELET
BASOS ABS: 0 10*3/uL (ref 0.0–0.1)
Basophils Relative: 0 % (ref 0–1)
EOS ABS: 0.1 10*3/uL (ref 0.0–0.7)
Eosinophils Relative: 1 % (ref 0–5)
HCT: 40 % (ref 39.0–52.0)
Hemoglobin: 13.3 g/dL (ref 13.0–17.0)
LYMPHS PCT: 13 % (ref 12–46)
Lymphs Abs: 1.1 10*3/uL (ref 0.7–4.0)
MCH: 30.4 pg (ref 26.0–34.0)
MCHC: 33.3 g/dL (ref 30.0–36.0)
MCV: 91.5 fL (ref 78.0–100.0)
MONOS PCT: 8 % (ref 3–12)
Monocytes Absolute: 0.6 10*3/uL (ref 0.1–1.0)
Neutro Abs: 6.3 10*3/uL (ref 1.7–7.7)
Neutrophils Relative %: 78 % — ABNORMAL HIGH (ref 43–77)
Platelets: DECREASED 10*3/uL (ref 150–400)
RBC: 4.37 MIL/uL (ref 4.22–5.81)
RDW: 14.9 % (ref 11.5–15.5)
Smear Review: DECREASED
WBC: 8.1 10*3/uL (ref 4.0–10.5)

## 2014-01-04 LAB — TROPONIN I
Troponin I: <0.3 ng/mL (ref <0.30)
Troponin I: <0.3 ng/mL (ref <0.30)

## 2014-01-04 LAB — BASIC METABOLIC PANEL
BUN: 10 mg/dL (ref 6–23)
CO2: 33 mEq/L — ABNORMAL HIGH (ref 19–32)
Calcium: 8.7 mg/dL (ref 8.4–10.5)
Chloride: 94 mEq/L — ABNORMAL LOW (ref 96–112)
Creatinine, Ser: 0.86 mg/dL (ref 0.50–1.35)
GFR calc Af Amer: >90 mL/min (ref >90)
GFR calc non Af Amer: 90 mL/min — ABNORMAL LOW (ref >90)
Glucose, Bld: 94 mg/dL (ref 70–99)
Potassium: 5.1 mEq/L (ref 3.7–5.3)
Sodium: 138 mEq/L (ref 137–147)

## 2014-01-04 MED ORDER — METHYLPREDNISOLONE SODIUM SUCC 125 MG IJ SOLR
60.0000 mg | Freq: Two times a day (BID) | INTRAMUSCULAR | Status: DC
  Administered 2014-01-04 – 2014-01-07 (×6): 60 mg via INTRAVENOUS
  Filled 2014-01-04 (×6): qty 0.96
  Filled 2014-01-04: qty 2
  Filled 2014-01-04: qty 0.96

## 2014-01-04 MED ORDER — IPRATROPIUM BROMIDE 0.02 % IN SOLN
RESPIRATORY_TRACT | Status: AC
  Filled 2014-01-04: qty 2.5

## 2014-01-04 MED ORDER — IPRATROPIUM-ALBUTEROL 0.5-2.5 (3) MG/3ML IN SOLN
3.0000 mL | RESPIRATORY_TRACT | Status: DC
  Administered 2014-01-04 – 2014-01-05 (×4): 3 mL via RESPIRATORY_TRACT
  Filled 2014-01-04 (×4): qty 3

## 2014-01-04 MED ORDER — OXYCODONE-ACETAMINOPHEN 5-325 MG PO TABS
1.0000 | ORAL_TABLET | ORAL | Status: DC | PRN
  Administered 2014-01-05 – 2014-01-09 (×10): 1 via ORAL
  Filled 2014-01-04 (×10): qty 1

## 2014-01-04 MED ORDER — BIOTENE DRY MOUTH MT LIQD
15.0000 mL | Freq: Two times a day (BID) | OROMUCOSAL | Status: DC
  Administered 2014-01-04 – 2014-01-09 (×10): 15 mL via OROMUCOSAL

## 2014-01-04 MED ORDER — ALBUTEROL SULFATE (2.5 MG/3ML) 0.083% IN NEBU
5.0000 mg | INHALATION_SOLUTION | Freq: Once | RESPIRATORY_TRACT | Status: AC
  Administered 2014-01-04: 5 mg via RESPIRATORY_TRACT
  Filled 2014-01-04: qty 6

## 2014-01-04 MED ORDER — IPRATROPIUM BROMIDE 0.02 % IN SOLN
0.5000 mg | Freq: Once | RESPIRATORY_TRACT | Status: AC
  Administered 2014-01-04: 0.5 mg via RESPIRATORY_TRACT
  Filled 2014-01-04: qty 2.5

## 2014-01-04 MED ORDER — HYDROMORPHONE HCL PF 1 MG/ML IJ SOLN
1.0000 mg | INTRAMUSCULAR | Status: DC | PRN
  Administered 2014-01-04 – 2014-01-05 (×2): 1 mg via INTRAVENOUS
  Filled 2014-01-04 (×2): qty 1

## 2014-01-04 NOTE — H&P (Addendum)
[facility] History and Physical  Haig ProphetUlysses G Blakesley UJW:[id number]:[id number] DOB: 03/19/1951 DOA: 01/04/2014  Referring physician: ED physician PCP: No PCP Per Patient   Chief Complaint: shortness of breath  HPI:  63 year old male with past medical history of COPD on home oxygen, hypertension, CAD who [facility] with worsening shortness of breath and associated chest tightness started over past 24 hours prior to this admission. Pt was recently hospitalized for pneumonia and flu. He did not have associated productive cough, no fever or chills. No palpitations. His chest pain was retrosternal, 7-8/10 ini intensity, non radiating, at rest and has resolved with aspirin and nitro. No abdominal pain, nausea or vomiting. No blood in stool or urine. No lightheadedness or loss of consciousness.  In ED, vitals are stable with BP of 108/80 and then 152/82; HR 85 and T max 97.7 F, oxygen saturation 94% on 2 L  oxygen support. Pt was given 1 time nebulizer treatment in ED and continued to experience shortness of breath. BMP and CBC essentially unremarkable. CXR showed COPD/emphysema but no acute disease.  Assessment and Plan:  Principal Problem:   Acute respiratory failure with hypoxia - secondary to COPD exacerbation - management with xopenex and atrovent every 4 hours scheduled and duoneb every 2 hours as needed. Home medications/nebulizers brovana and pulmicort both BID scheduled - add solumedrol 60 mg every 12 hours IV - oxygen support via nasal canula to keep O2 saturation above 90% - obtain respiratory culture - no fevers and no elevated WBC count, no evidence of pneumonia so antibiotic treatment deferred for now - COPD gold alert and COPD order set placed Active Problems:   COPD with acute exacerbation - management as above with BD and steroids   Chest pain - rule out ACS - cycle cardiac enzymes - the 12 lead EKG on admission showed NSR - last 2 D ECHO in 01/2013 and had normal EF    HYPERTENSION - continue metoprolol   CAD - continue aspirin and plavix  Code Status:Full Family Communication: Pt at bedside Disposition Plan: Admit for further evaluation    Review of Systems:  Constitutional: Negative for fever, chills and malaise/fatigue. Negative for diaphoresis.  HENT: Negative for hearing loss, ear pain, nosebleeds, congestion, sore throat, neck pain, tinnitus and ear discharge.   Eyes: Negative for blurred vision, double vision, photophobia, pain, discharge and redness.  Respiratory: Negative for cough, hemoptysis, sputum production, positive for shortness of breath, wheezing.   Cardiovascular: positive for chest pain, no palpitations, orthopnea, claudication and leg swelling.  Gastrointestinal: Negative for nausea, vomiting and abdominal pain. Negative for heartburn, constipation, blood in stool and melena.  Genitourinary: Negative for dysuria, urgency, frequency, hematuria and flank pain.  Musculoskeletal: Negative for myalgias, back pain, joint pain and falls.  Skin: Negative for itching and rash.  Neurological: Negative for dizziness and weakness. Negative for tingling, tremors, sensory change, speech change, focal weakness, loss of consciousness and headaches.  Endo/Heme/Allergies: Negative for environmental allergies and polydipsia. Does not bruise/bleed easily.  Psychiatric/Behavioral: Negative for suicidal ideas. The patient is not nervous/anxious.      Past Medical History  Diagnosis Date  . COPD (chronic obstructive pulmonary disease)   . CAD (coronary artery disease)   . Hypertension   . Hyperlipidemia   . Osteomyelitis   . Myocardial infarction     "I've had a couple since 2006" (12/26/2013)  . Anginal pain   . Asthma   . Pneumonia     "have had it  several times; maybe now too": (12/26/2013)  . Shortness of breath     "all the time" (12/26/2013)  . On home oxygen therapy     "2-3L; 24/7" (12/26/2013)  . GERD (gastroesophageal reflux disease)   .  Daily headache   . Stroke     "they say I've had a couple" denies residual on 12/26/2013  . Arthritis     "hands; feet" (12/26/2013)  . Osteomyelitis hip     "right"  . Chronic lower back pain     Past Surgical History  Procedure Laterality Date  . Appendectomy  1963  . Cardiac catheterization  ? 2009; ?2012  . Coronary angioplasty with stent placement  2006; ?2008; ?2010    "2 + 2 + 1"    Social History:  reports that he quit smoking about 6 weeks ago. His smoking use included Cigarettes. He has a 153 pack-year smoking history. He has never used smokeless tobacco. He reports that he does not drink alcohol or use illicit drugs.  Allergies  Allergen Reactions  . Lipitor [Atorvastatin Calcium] Other (See Comments)    Muscle aches    Family History  Problem Relation Age of Onset  . Coronary artery disease    . Hypertension      Prior to Admission medications   Medication Sig Start Date End Date Taking? Authorizing Provider  arformoterol (BROVANA) 15 MCG/2ML NEBU Take 2 mLs (15 mcg total) by nebulization 2 (two) times daily. 12/29/13  Yes Henderson Cloud, MD  aspirin 325 MG tablet Take 650 mg by mouth every morning.    Yes Historical Provider, MD  aspirin-sod bicarb-citric acid (ALKA-SELTZER) 325 MG TBEF tablet Take 325 mg by mouth every 6 (six) hours as needed (for heartburn).   Yes Historical Provider, MD  budesonide (PULMICORT) 0.25 MG/2ML nebulizer solution Take 0.25 mg by nebulization 2 (two) times daily.   Yes Historical Provider, MD  clopidogrel (PLAVIX) 75 MG tablet Take 1 tablet (75 mg total) by mouth daily. 12/25/13  Yes Rodolph Bong, MD  fluconazole (DIFLUCAN) 100 MG tablet Take 1 tablet (100 mg total) by mouth daily. For 5 more days 12/29/13  Yes Estela Isaiah Blakes, MD  fluticasone [facility]) 50 MCG/ACT nasal spray Place 2 sprays into both nostrils 2 (two) times daily. 12/29/13  Yes Estela Isaiah Blakes, MD  furosemide (LASIX) 20 MG tablet Take 1  tablet (20 mg total) by mouth every morning. 12/25/13  Yes Rodolph Bong, MD  HYDROcodone-acetaminophen (NORCO/VICODIN) 5-325 MG per tablet Take 1 tablet by mouth every 4 (four) hours as needed for moderate pain. 12/25/13  Yes Rodolph Bong, MD  ipratropium-albuterol (DUONEB) 0.5-2.5 (3) MG/3ML SOLN Take 3 mLs by nebulization every 4 (four) hours as needed. Use every 4 hours x 4 days then use every 2-4 hours as needed. 12/25/13  Yes Rodolph Bong, MD  metoprolol (LOPRESSOR) 50 MG tablet Take 1 tablet (50 mg total) by mouth 2 (two) times daily. 10/19/13  Yes Leslye Peer, MD  pantoprazole (PROTONIX) 40 MG tablet Take 1 tablet (40 mg total) by mouth daily at 12 noon. 12/25/13  Yes Rodolph Bong, MD  predniSONE (DELTASONE) 20 MG tablet Take 1 tablet (20 mg total) by mouth daily with breakfast. 12/29/13  Yes Estela Isaiah Blakes, MD  terbutaline (BRETHINE) 2.5 MG tablet Take 1 tablet (2.5 mg total) by mouth every 6 (six) hours. 12/29/13  Yes Estela Isaiah Blakes, MD  traZODone (DESYREL) 50 MG  tablet Take 1 tablet (50 mg total) by mouth at bedtime as needed for sleep. 12/25/13  Yes Rodolph Bong, MD    Physical Exam: Filed Vitals:   01/04/14 1800 01/04/14 1815 01/04/14 1830 01/04/14 1845  BP: 115/68 121/87 123/74 111/76  Pulse: 87 90  87  Temp:      TempSrc:      Resp: 16 14  22   Height:      Weight:      SpO2: 94% 94% 91% 95%    Physical Exam  Constitutional: Appears well-developed and well-nourished. No distress.  HENT: Normocephalic. External right and left ear normal. Oropharynx is clear and moist.  Eyes: Conjunctivae and EOM are normal. PERRLA, no scleral icterus.  Neck: Normal ROM. Neck supple. No JVD. No tracheal deviation. No thyromegaly.  CVS: RRR, S1/S2 +, no murmurs, no gallops, no carotid bruit.  Pulmonary: diminished breath sounds with some coarse breath sounds, no wheezing Abdominal: Soft. BS +,  no distension, tenderness, rebound or guarding.   Musculoskeletal: Normal range of motion. Pulses palpable bilaterally  Lymphadenopathy: No lymphadenopathy noted, cervical, inguinal. Neuro: Alert. Normal reflexes, muscle tone coordination. No cranial nerve deficit. Skin: Skin is warm and dry. No rash noted. Not diaphoretic. No erythema. No pallor.  Psychiatric: Normal mood and affect. Behavior, judgment, thought content normal.   Labs on Admission:  Basic Metabolic Panel:  Recent Labs Lab 01/04/14 1326  NA 138  K 5.1  CL 94*  CO2 33*  GLUCOSE 94  BUN 10  CREATININE 0.86  CALCIUM 8.7   Liver Function Tests: No results found for this basename: AST, ALT, ALKPHOS, BILITOT, PROT, ALBUMIN,  in the last 168 hours No results found for this basename: LIPASE, AMYLASE,  in the last 168 hours No results found for this basename: AMMONIA,  in the last 168 hours CBC:  Recent Labs Lab 01/04/14 1326  WBC 8.1  NEUTROABS 6.3  HGB 13.3  HCT 40.0  MCV 91.5  PLT PLATELETS APPEAR DECREASED   Cardiac Enzymes:  Recent Labs Lab 01/04/14 1326  TROPONINI <0.30   BNP: No components found with this basename: POCBNP,  CBG: No results found for this basename: GLUCAP,  in the last 168 hours  Radiological Exams on Admission: Dg Chest 2 View  01/04/2014   CLINICAL DATA:  Shortness of breath and cough.  Chest pain.  EXAM: CHEST  2 VIEW  COMPARISON:  Single view of the chest 12/26/2013 and CT chest 07/28/2011.  FINDINGS: The lungs are emphysematous but clear. Heart size is normal. No pneumothorax or pleural fluid.  IMPRESSION: Emphysema without acute disease.   Electronically Signed   By: [name] M.D.   On: 01/04/2014 14:08    Debbora Presto, MD  [facility] Pager [phone]  If 7PM-7AM, please contact night-coverage [email or web address] Password [facility] 01/04/2014, 7:29 PM

## 2014-01-04 NOTE — ED Notes (Signed)
Another cup of coffee given to patient, patient resting and talking on the telephone

## 2014-01-04 NOTE — ED Provider Notes (Signed)
CSN: [id number]     Arrival date & time 01/04/14  1308 History   First MD Initiated Contact with Patient 01/04/14 1530     Chief Complaint  Patient presents with  . Shortness of Breath  . Back Pain  . foot numbness    (Consider location/radiation/quality/duration/timing/severity/associated sxs/prior Treatment) HPI This is a 63 year old man with a history of COPD who has been recently hospitalized for COPD exacerbation and influenza who has been home for the past week. Last night he had 2 episodes of chest pain relieved with nitroglycerin. Today he has had worsening dyspnea. He describes some diffuse chest tightness. His cough has continued to be productive with discolored sputum but has not changed from previous. He has had more dyspnea and noted that he was only able to get his sats up to 82% at home today. He is improved here on the nasal cannula which is the same rate as what he uses at home. He states that this has only been improved in the past hour since he has been here. He states that he was brought into the hospital by his daughters do to his increased dyspnea. He is also felt like he has been very dehydrated with dry mucous membranes and decreased urine output. Past Medical History  Diagnosis Date  . COPD (chronic obstructive pulmonary disease)   . CAD (coronary artery disease)   . Hypertension   . Hyperlipidemia   . Osteomyelitis   . Myocardial infarction     "I've had a couple since 2006" (12/26/2013)  . Anginal pain   . Asthma   . Pneumonia     "have had it several times; maybe now too": (12/26/2013)  . Shortness of breath     "all the time" (12/26/2013)  . On home oxygen therapy     "2-3L; 24/7" (12/26/2013)  . GERD (gastroesophageal reflux disease)   . Daily headache   . Stroke     "they say I've had a couple" denies residual on 12/26/2013  . Arthritis     "hands; feet" (12/26/2013)  . Osteomyelitis hip     "right"  . Chronic lower back pain    Past Surgical History   Procedure Laterality Date  . Appendectomy  1963  . Cardiac catheterization  ? 2009; ?2012  . Coronary angioplasty with stent placement  2006; ?2008; ?2010    "2 + 2 + 1"   Family History  Problem Relation Age of Onset  . Coronary artery disease    . Hypertension     History  Substance Use Topics  . Smoking status: Former Smoker -- 3.00 packs/day for 51 years    Types: Cigarettes    Quit date: 11/19/2013  . Smokeless tobacco: Never Used  . Alcohol Use: No    Review of Systems  All other systems reviewed and are negative.    Allergies  Lipitor  Home Medications   Current Outpatient Rx  Name  Route  Sig  Dispense  Refill  . arformoterol (BROVANA) 15 MCG/2ML NEBU   Nebulization   Take 2 mLs (15 mcg total) by nebulization 2 (two) times daily.   120 mL   0   . aspirin 325 MG tablet   Oral   Take 650 mg by mouth every morning.          Marland Kitchen. aspirin-sod bicarb-citric acid (ALKA-SELTZER) 325 MG TBEF tablet   Oral   Take 325 mg by mouth every 6 (six) hours as needed (for heartburn).         .Marland Kitchen  budesonide (PULMICORT) 0.25 MG/2ML nebulizer solution   Nebulization   Take 0.25 mg by nebulization 2 (two) times daily.         . clopidogrel (PLAVIX) 75 MG tablet   Oral   Take 1 tablet (75 mg total) by mouth daily.   31 tablet   0   . fluconazole (DIFLUCAN) 100 MG tablet   Oral   Take 1 tablet (100 mg total) by mouth daily. For 5 more days   5 tablet   0   . fluticasone (FLONASE) 50 MCG/ACT nasal spray   Each Nare   Place 2 sprays into both nostrils 2 (two) times daily.   16 g   2   . furosemide (LASIX) 20 MG tablet   Oral   Take 1 tablet (20 mg total) by mouth every morning.   30 tablet   0   . HYDROcodone-acetaminophen (NORCO/VICODIN) 5-325 MG per tablet   Oral   Take 1 tablet by mouth every 4 (four) hours as needed for moderate pain.   20 tablet   0   . ipratropium-albuterol (DUONEB) 0.5-2.5 (3) MG/3ML SOLN   Nebulization   Take 3 mLs by  nebulization every 4 (four) hours as needed. Use every 4 hours x 4 days then use every 2-4 hours as needed.   360 mL   0   . metoprolol (LOPRESSOR) 50 MG tablet   Oral   Take 1 tablet (50 mg total) by mouth 2 (two) times daily.   60 tablet   6   . pantoprazole (PROTONIX) 40 MG tablet   Oral   Take 1 tablet (40 mg total) by mouth daily at 12 noon.   30 tablet   0   . predniSONE (DELTASONE) 20 MG tablet   Oral   Take 1 tablet (20 mg total) by mouth daily with breakfast.   30 tablet   2   . terbutaline (BRETHINE) 2.5 MG tablet   Oral   Take 1 tablet (2.5 mg total) by mouth every 6 (six) hours.   120 tablet   1   . traZODone (DESYREL) 50 MG tablet   Oral   Take 1 tablet (50 mg total) by mouth at bedtime as needed for sleep.   20 tablet   0    BP 152/82  Pulse 91  Temp(Src) 97.7 F (36.5 C) (Oral)  Resp 18  Ht 5\' 11"  (1.803 m)  Wt 201 lb (91.173 kg)  BMI 28.05 kg/m2  SpO2 97% Physical Exam  Nursing note and vitals reviewed. Constitutional: He is oriented to person, place, and time. He appears well-developed and well-nourished.  HENT:  Head: Normocephalic and atraumatic.  Right Ear: External ear normal.  Left Ear: External ear normal.  Nose: Nose normal.  Mouth/Throat: Oropharynx is clear and moist.  Mucous membranes are dry  Eyes: Conjunctivae and EOM are normal. Pupils are equal, round, and reactive to light.  Neck: Normal range of motion.  Cardiovascular: Normal rate, regular rhythm, normal heart sounds and intact distal pulses.   Pulmonary/Chest:  Decreased breath sounds diffusely  Abdominal: Soft. Bowel sounds are normal.  Diffuse contusions consistent with injection No distention of the lower abdomen consistent with urinary retention  Genitourinary: Penis normal.  Musculoskeletal: Normal range of motion. He exhibits edema.  Neurological: He is alert and oriented to person, place, and time. He has normal reflexes.  Skin: Skin is warm and dry.   Psychiatric: He has a normal mood and affect. His behavior  is normal. Judgment and thought content normal.    ED Course  Procedures (including critical care time) Labs Review Labs Reviewed  BASIC METABOLIC PANEL - Abnormal; Notable for the following:    Chloride 94 (*)    CO2 33 (*)    GFR calc non Af Amer 90 (*)    All other components within normal limits  CBC WITH DIFFERENTIAL - Abnormal; Notable for the following:    Neutrophils Relative % 78 (*)    All other components within normal limits  TROPONIN I   Imaging Review Dg Chest 2 View  01/04/2014   CLINICAL DATA:  Shortness of breath and cough.  Chest pain.  EXAM: CHEST  2 VIEW  COMPARISON:  Single view of the chest 12/26/2013 and CT chest 07/28/2011.  FINDINGS: The lungs are emphysematous but clear. Heart size is normal. No pneumothorax or pleural fluid.  IMPRESSION: Emphysema without acute disease.   Electronically Signed   By: [name] M.D.   On: 01/04/2014 14:08    EKG Interpretation    Date/Time:  Friday January 04 2014 13:11:36 EST Ventricular Rate:  94 PR Interval:  156 QRS Duration: 94 QT Interval:  330 QTC Calculation: 412 R Axis:   101 Text Interpretation:  Normal sinus rhythm Rightward axis Borderline ECG Confirmed by Tyjuan Demetro MD, Brantlee Hinde ([id number]) on 01/04/2014 6:55:55 PM            MDM  No diagnosis found. This is a 63 year old male with known coronary artery the use and COPD who was recently been admitted due to COPD exacerbation and influenza. He comes in today stating that he had 2 episodes of chest pain last night that were resolved with nitroglycerin. He probably had had some ongoing tightness and continues to have dyspnea from COPD. He appears to be stable from a COPD standpoint does require further assessment of his chest pain.  Discussed with Dr. Izola Price and she will place admission orders.   Hilario Quarry, MD 01/04/14 [place] [place]

## 2014-01-04 NOTE — ED Notes (Signed)
Report attempted x 1

## 2014-01-04 NOTE — ED Notes (Addendum)
[facility] after being tx for pnx/flu.  Last night pt experienced bil chest pain, R greater than L, described as a "bruised muscle", lasting 20 minutes.  Pain was relieved by nitro and asa.  At that same time pt began feeling sob and continues to feel sob.  PT also c/o bil lower back pain (chronic but worse), difficulty urinating and bil foot numbness/burning since yesterday.  Pt sats of 93% on 3L O2.

## 2014-01-04 NOTE — ED Notes (Signed)
Dr Ray at bedside. 

## 2014-01-04 NOTE — Telephone Encounter (Signed)
[email or web address] x1 

## 2014-01-05 DIAGNOSIS — F17200 Nicotine dependence, unspecified, uncomplicated: Secondary | ICD-10-CM

## 2014-01-05 DIAGNOSIS — R079 Chest pain, unspecified: Secondary | ICD-10-CM

## 2014-01-05 LAB — CBC WITH DIFFERENTIAL/PLATELET
BASOS PCT: 0 % (ref 0–1)
Basophils Absolute: 0 10*3/uL (ref 0.0–0.1)
Eosinophils Absolute: 0 10*3/uL (ref 0.0–0.7)
Eosinophils Relative: 0 % (ref 0–5)
HEMATOCRIT: 38.1 % — AB (ref 39.0–52.0)
Hemoglobin: 12.7 g/dL — ABNORMAL LOW (ref 13.0–17.0)
Lymphocytes Relative: 7 % — ABNORMAL LOW (ref 12–46)
Lymphs Abs: 0.4 10*3/uL — ABNORMAL LOW (ref 0.7–4.0)
MCH: 29.8 pg (ref 26.0–34.0)
MCHC: 33.3 g/dL (ref 30.0–36.0)
MCV: 89.4 fL (ref 78.0–100.0)
MONO ABS: 0.1 10*3/uL (ref 0.1–1.0)
MONOS PCT: 1 % — AB (ref 3–12)
NEUTROS ABS: 4.7 10*3/uL (ref 1.7–7.7)
Neutrophils Relative %: 92 % — ABNORMAL HIGH (ref 43–77)
Platelets: 138 10*3/uL — ABNORMAL LOW (ref 150–400)
RBC: 4.26 MIL/uL (ref 4.22–5.81)
RDW: 14.4 % (ref 11.5–15.5)
WBC: 5.1 10*3/uL (ref 4.0–10.5)

## 2014-01-05 LAB — COMPREHENSIVE METABOLIC PANEL
ALT: 39 U/L (ref 0–53)
AST: 19 U/L (ref 0–37)
Albumin: 2.9 g/dL — ABNORMAL LOW (ref 3.5–5.2)
Alkaline Phosphatase: 58 U/L (ref 39–117)
BILIRUBIN TOTAL: 0.3 mg/dL (ref 0.3–1.2)
BUN: 12 mg/dL (ref 6–23)
CHLORIDE: 94 meq/L — AB (ref 96–112)
CO2: 31 mEq/L (ref 19–32)
Calcium: 9 mg/dL (ref 8.4–10.5)
Creatinine, Ser: 0.74 mg/dL (ref 0.50–1.35)
GFR calc Af Amer: >90 mL/min (ref >90)
GFR calc non Af Amer: >90 mL/min (ref >90)
Glucose, Bld: 172 mg/dL — ABNORMAL HIGH (ref 70–99)
Potassium: 4.8 mEq/L (ref 3.7–5.3)
Sodium: 138 mEq/L (ref 137–147)
Total Protein: 6.1 g/dL (ref 6.0–8.3)

## 2014-01-05 LAB — TROPONIN I: Troponin I: <0.3 ng/mL (ref <0.30)

## 2014-01-05 LAB — INFLUENZA PANEL BY PCR (TYPE A & B)
H1N1 flu by pcr: NOT DETECTED
INFLAPCR: NEGATIVE
Influenza B By PCR: NEGATIVE

## 2014-01-05 LAB — PROTIME-INR
INR: 0.84 (ref 0.00–1.49)
Prothrombin Time: 11.4 seconds — ABNORMAL LOW (ref 11.6–15.2)

## 2014-01-05 LAB — MAGNESIUM: Magnesium: 2.2 mg/dL (ref 1.5–2.5)

## 2014-01-05 LAB — PHOSPHORUS: Phosphorus: 4.3 mg/dL (ref 2.3–4.6)

## 2014-01-05 LAB — TSH: TSH: 0.975 u[IU]/mL (ref 0.350–4.500)

## 2014-01-05 LAB — APTT: aPTT: 28 seconds (ref 24–37)

## 2014-01-05 MED ORDER — FUROSEMIDE 20 MG PO TABS
20.0000 mg | ORAL_TABLET | Freq: Every morning | ORAL | Status: DC
  Administered 2014-01-05 – 2014-01-09 (×5): 20 mg via ORAL
  Filled 2014-01-05 (×5): qty 1

## 2014-01-05 MED ORDER — CLOPIDOGREL BISULFATE 75 MG PO TABS
75.0000 mg | ORAL_TABLET | Freq: Every day | ORAL | Status: DC
  Administered 2014-01-05 – 2014-01-09 (×5): 75 mg via ORAL
  Filled 2014-01-05 (×5): qty 1

## 2014-01-05 MED ORDER — LEVALBUTEROL HCL 0.63 MG/3ML IN NEBU
0.6300 mg | INHALATION_SOLUTION | RESPIRATORY_TRACT | Status: DC
  Administered 2014-01-05 – 2014-01-06 (×6): 0.63 mg via RESPIRATORY_TRACT
  Filled 2014-01-05 (×12): qty 3

## 2014-01-05 MED ORDER — PANTOPRAZOLE SODIUM 40 MG PO TBEC
40.0000 mg | DELAYED_RELEASE_TABLET | Freq: Every day | ORAL | Status: DC
  Administered 2014-01-05 – 2014-01-09 (×5): 40 mg via ORAL
  Filled 2014-01-05 (×4): qty 1

## 2014-01-05 MED ORDER — HYDROCODONE-ACETAMINOPHEN 5-325 MG PO TABS
1.0000 | ORAL_TABLET | ORAL | Status: DC | PRN
  Administered 2014-01-08: 1 via ORAL
  Filled 2014-01-05: qty 1

## 2014-01-05 MED ORDER — CALCIUM CARBONATE ANTACID 500 MG PO CHEW
2.0000 | CHEWABLE_TABLET | Freq: Four times a day (QID) | ORAL | Status: DC | PRN
  Filled 2014-01-05: qty 2

## 2014-01-05 MED ORDER — ONDANSETRON HCL 4 MG PO TABS: 4.0000 mg | ORAL_TABLET | Freq: Four times a day (QID) | ORAL | Status: DC | PRN

## 2014-01-05 MED ORDER — ONDANSETRON HCL 4 MG/2ML IJ SOLN: 4.0000 mg | Freq: Four times a day (QID) | INTRAMUSCULAR | Status: DC | PRN

## 2014-01-05 MED ORDER — METOPROLOL TARTRATE 50 MG PO TABS
50.0000 mg | ORAL_TABLET | Freq: Two times a day (BID) | ORAL | Status: DC
  Administered 2014-01-05 – 2014-01-09 (×9): 50 mg via ORAL
  Filled 2014-01-05 (×10): qty 1

## 2014-01-05 MED ORDER — NYSTATIN 100000 UNIT/ML MT SUSP
5.0000 mL | Freq: Four times a day (QID) | OROMUCOSAL | Status: DC
  Administered 2014-01-05 – 2014-01-09 (×15): 500000 [IU] via ORAL
  Filled 2014-01-05 (×20): qty 5

## 2014-01-05 MED ORDER — FLUTICASONE PROPIONATE 50 MCG/ACT NA SUSP
2.0000 | Freq: Two times a day (BID) | NASAL | Status: DC
  Administered 2014-01-05 – 2014-01-09 (×6): 2 via NASAL
  Filled 2014-01-05: qty 16

## 2014-01-05 MED ORDER — SODIUM CHLORIDE 0.9 % IJ SOLN
3.0000 mL | Freq: Two times a day (BID) | INTRAMUSCULAR | Status: DC
  Administered 2014-01-05 – 2014-01-09 (×8): 3 mL via INTRAVENOUS

## 2014-01-05 MED ORDER — ASPIRIN EFFERVESCENT 325 MG PO TBEF: 325.0000 mg | EFFERVESCENT_TABLET | Freq: Four times a day (QID) | ORAL | Status: DC | PRN

## 2014-01-05 MED ORDER — IPRATROPIUM-ALBUTEROL 0.5-2.5 (3) MG/3ML IN SOLN: 3.0000 mL | RESPIRATORY_TRACT | Status: DC | PRN

## 2014-01-05 MED ORDER — ACETAMINOPHEN 325 MG PO TABS: 650.0000 mg | ORAL_TABLET | Freq: Four times a day (QID) | ORAL | Status: DC | PRN

## 2014-01-05 MED ORDER — ASPIRIN 325 MG PO TABS
650.0000 mg | ORAL_TABLET | Freq: Every morning | ORAL | Status: DC
Start: 2014-01-05 — End: 2014-01-09
  Administered 2014-01-05 – 2014-01-09 (×5): 650 mg via ORAL
  Filled 2014-01-05 (×5): qty 2

## 2014-01-05 MED ORDER — TRAZODONE HCL 50 MG PO TABS
50.0000 mg | ORAL_TABLET | Freq: Every evening | ORAL | Status: DC | PRN
  Administered 2014-01-07 – 2014-01-09 (×2): 50 mg via ORAL
  Filled 2014-01-05 (×2): qty 1

## 2014-01-05 MED ORDER — HYDROMORPHONE HCL PF 1 MG/ML IJ SOLN
1.0000 mg | INTRAMUSCULAR | Status: DC | PRN
  Administered 2014-01-05 – 2014-01-07 (×2): 1 mg via INTRAVENOUS
  Filled 2014-01-05 (×2): qty 1

## 2014-01-05 MED ORDER — TERBUTALINE SULFATE 2.5 MG PO TABS
2.5000 mg | ORAL_TABLET | Freq: Four times a day (QID) | ORAL | Status: DC
  Administered 2014-01-05 – 2014-01-09 (×16): 2.5 mg via ORAL
  Filled 2014-01-05 (×22): qty 1

## 2014-01-05 MED ORDER — CEFUROXIME AXETIL 500 MG PO TABS
500.0000 mg | ORAL_TABLET | Freq: Two times a day (BID) | ORAL | Status: DC
  Administered 2014-01-05 – 2014-01-09 (×9): 500 mg via ORAL
  Filled 2014-01-05 (×11): qty 1

## 2014-01-05 MED ORDER — SODIUM CHLORIDE 0.9 % IV SOLN: INTRAVENOUS | Status: DC

## 2014-01-05 MED ORDER — IPRATROPIUM BROMIDE 0.02 % IN SOLN
0.5000 mg | RESPIRATORY_TRACT | Status: DC
  Administered 2014-01-05 – 2014-01-06 (×6): 0.5 mg via RESPIRATORY_TRACT
  Filled 2014-01-05 (×6): qty 2.5

## 2014-01-05 MED ORDER — ACETAMINOPHEN 650 MG RE SUPP: 650.0000 mg | Freq: Four times a day (QID) | RECTAL | Status: DC | PRN

## 2014-01-05 NOTE — Progress Notes (Signed)
Nutrition Consult Note  Patient identified via diet consult for COPD gold protocol.   Wt Readings from Last 5 Encounters:  01/04/14 201 lb (91.173 kg)  12/27/13 202 lb 1.6 oz (91.672 kg)  12/25/13 201 lb 6.4 oz (91.354 kg)  11/28/13 205 lb (92.987 kg)  10/19/13 201 lb 12.8 oz (91.536 kg)    Body mass index is 28.05 kg/(m^2). Patient meets criteria for overweight based on current BMI.   Current diet order is heart healthy, patient is consuming approximately 100% of meals at this time. Labs and medications reviewed. Met with pt who reports excellent appetite PTA , eats 3 meals/day with stable weight. States he tries to avoid high sodium foods and eat a well balanced diet. Denies any nutrition concerns at this time. Currently eating excellent.   No nutrition interventions warranted at this time. If nutrition issues arise, please consult RD.   Mikey College MS, RD, LDN [phone] Weekend/After Hours Pager

## 2014-01-05 NOTE — Evaluation (Signed)
Physical Therapy Evaluation Patient Details Name: [name] MRN: [id number] DOB: 06/20/1951 Today's Date: 01/05/2014 Time: [id number] PT Time Calculation (min): 21 min  PT Assessment / Plan / Recommendation History of Present Illness  pt returns after recent admition of COPD exacerbation with worsening   Clinical Impression  Pt admitted with worsening SOB. Pt currently with functional limitations due to the deficits listed below (see PT Problem List).  Pt will benefit from skilled PT to increase their independence and safety with mobility to allow discharge to the venue listed below.       PT Assessment  Patient needs continued PT services    Follow Up Recommendations  Home health PT;Supervision - Intermittent    Does the patient have the potential to tolerate intense rehabilitation      Barriers to Discharge        Equipment Recommendations       Recommendations for Other Services     Frequency Min 3X/week    Precautions / Restrictions Precautions Precautions: Fall Precaution Comments: limited by SOB Restrictions Weight Bearing Restrictions: No   Pertinent Vitals/Pain 97/98% on 3L [place]  HR in 80's/90's      Mobility  Bed Mobility Overal bed mobility: Modified Independent Transfers Overall transfer level: Modified independent Ambulation/Gait Ambulation/Gait assistance: Modified independent (Device/Increase time) Ambulation Distance (Feet): 250 Feet Assistive device:  (pushing portable O2) Gait Pattern/deviations: Step-through pattern Gait velocity: slow Gait velocity interpretation: Below normal speed for age/gender General Gait Details: sats maintained at approx 97% on 3L [place]    Exercises     PT Diagnosis: Generalized weakness;Other (comment) (decr activity tolerance)  PT Problem List: Decreased activity tolerance;Decreased mobility;Decreased knowledge of use of DME;Cardiopulmonary status limiting activity PT Treatment Interventions: Gait  training;Functional mobility training;Therapeutic activities;Patient/family education;DME instruction     PT Goals(Current goals can be found in the care plan section) Acute Rehab PT Goals Patient Stated Goal: Go home soon PT Goal Formulation: With patient Time For Goal Achievement: 01/12/14 Potential to Achieve Goals: Good  Visit Information  Last PT Received On: 01/05/14 Assistance Needed: +1 History of Present Illness: pt returns after recent admition of COPD exacerbation with worsening        Prior Functioning  Home Living Family/patient expects to be discharged to:: Private residence Living Arrangements: Children Available Help at Discharge: Available 24 hours/day Type of Home: Apartment Home Access: Level entry Home Layout: One level Home Equipment: Cane - single point;Other (comment) Prior Function Level of Independence: Independent Communication Communication: No difficulties    Cognition  Cognition Arousal/Alertness: Awake/alert Behavior During Therapy: WFL for tasks assessed/performed Overall Cognitive Status: Within Functional Limits for tasks assessed    Extremity/Trunk Assessment Lower Extremity Assessment Lower Extremity Assessment: Overall WFL for tasks assessed;Generalized weakness Cervical / Trunk Assessment Cervical / Trunk Assessment: Normal   Balance Balance Overall balance assessment: No apparent balance deficits (not formally assessed)  End of Session PT - End of Session Equipment Utilized During Treatment: Oxygen Activity Tolerance: Patient tolerated treatment well Patient left: Other (comment) (sitting EOB) Nurse Communication: Mobility status  GP     Dashiell Franchino, Eliseo GumKenneth V 01/05/2014, 5:05 PM 01/05/2014  Havana BingKen Yassmin Binegar, PT [phone]  (pager)

## 2014-01-05 NOTE — Progress Notes (Signed)
Patient ID: [name]  male  [id number]    DOB: 1951-09-01    DOA: 01/04/2014  PCP: No PCP Per Patient  Assessment/Plan: Principal Problem:   Acute respiratory failure with hypoxia secondary to COPD exacerbation- still very wheezy - Continue O2 via nasal cannula, nebulizer breathing treatments q4hrs, IV steroids, PPI - Continue Brovana, Pulmicort, placed on nystatin for oral thrush  - Check influenza panel - Placed on Ceftin   Active Problems:   HYPERTENSION - Currently stable    CAD with chest pain- resolved, echo 2014 had normal EF - Continue aspirin, Plavix, metoprolol - Intolerant to statins  DVT Prophylaxis: SCD's  Code Status:  Family Communication:  Disposition:TBD    Subjective: Still wheezing bilaterally, on O2 3 L nasal cannula  Objective: Weight change:   Intake/Output Summary (Last 24 hours) at 01/05/14 0959 Last data filed at 01/05/14 0900  Gross per 24 hour  Intake    360 ml  Output      0 ml  Net    360 ml   Blood pressure 113/73, pulse 85, temperature 98.1 F (36.7 C), temperature source Oral, resp. rate 18, height 5\' 11"  (1.803 m), weight 91.173 kg (201 lb), SpO2 95.00%.  Physical Exam: General: Alert and awake, oriented x3, not in any acute distress. CVS: S1-S2 clear, no murmur rubs or gallops Chest: Bilateral wheezing  Abdomen: soft nontender, nondistended, normal bowel sounds  Extremities: no cyanosis, clubbing or edema noted bilaterally Neuro: Cranial nerves II-XII intact, no focal neurological deficits  Lab Results: Basic Metabolic Panel:  Recent Labs Lab 01/04/14 1326 01/05/14 0845  NA 138 138  K 5.1 4.8  CL 94* 94*  CO2 33* 31  GLUCOSE 94 172*  BUN 10 12  CREATININE 0.86 0.74  CALCIUM 8.7 9.0  MG  --  2.2  PHOS  --  4.3   Liver Function Tests:  Recent Labs Lab 01/05/14 0845  AST 19  ALT 39  ALKPHOS 58  BILITOT 0.3  PROT 6.1  ALBUMIN 2.9*   No results found for this basename: LIPASE, AMYLASE,  in the last  168 hours No results found for this basename: AMMONIA,  in the last 168 hours CBC:  Recent Labs Lab 01/04/14 1326 01/05/14 0845  WBC 8.1 5.1  NEUTROABS 6.3 4.7  HGB 13.3 12.7*  HCT 40.0 38.1*  MCV 91.5 89.4  PLT PLATELETS APPEAR DECREASED 138*   Cardiac Enzymes:  Recent Labs Lab 01/04/14 2215 01/05/14 0400 01/05/14 0750  TROPONINI <0.30 <0.30 <0.30   BNP: No components found with this basename: POCBNP,  CBG: No results found for this basename: GLUCAP,  in the last 168 hours   Micro Results: No results found for this or any previous visit (from the past 240 hour(s)).  Studies/Results: Dg Chest 2 View  01/04/2014   CLINICAL DATA:  Shortness of breath and cough.  Chest pain.  EXAM: CHEST  2 VIEW  COMPARISON:  Single view of the chest 12/26/2013 and CT chest 07/28/2011.  FINDINGS: The lungs are emphysematous but clear. Heart size is normal. No pneumothorax or pleural fluid.  IMPRESSION: Emphysema without acute disease.   Electronically Signed   By: [name] M.D.   On: 01/04/2014 14:08   Dg Chest 2 View  12/15/2013   CLINICAL DATA:  Shortness of breath, chest pain, COPD  EXAM: CHEST  2 VIEW  COMPARISON:  08/25/2013  FINDINGS: Hyperinflation noted compatible with background COPD/ emphysema. No focal pneumonia, edema, effusion or pneumothorax. Trachea  midline. Normal heart size and vascularity.  IMPRESSION: Chronic COPD/ emphysema.  No superimposed acute process   Electronically Signed   By: Ruel Favorsrevor  Shick M.D.   On: 12/15/2013 23:01   Dg Chest Portable 1 View  12/26/2013   CLINICAL DATA:  Shortness of breath  EXAM: PORTABLE CHEST - 1 VIEW  COMPARISON:  12/15/2013 in  FINDINGS: Hyperinflated lungs with apical emphysematous change. Chronic interstitial coarsening at the bases. No acute infiltrate, edema, effusion, or pneumothorax. Normal heart size.  IMPRESSION: COPD without superimposed pneumonia or edema.   Electronically Signed   By: Tiburcio PeaJonathan  Watts M.D.   On: 12/26/2013  02:27    Medications: Scheduled Meds: . antiseptic oral rinse  15 mL Mouth Rinse BID  . aspirin  650 mg Oral q morning - 10a  . cefUROXime  500 mg Oral BID WC  . clopidogrel  75 mg Oral Daily  . fluticasone  2 spray Each Nare BID  . furosemide  20 mg Oral q morning - 10a  . ipratropium  0.5 mg Nebulization Q4H  . ipratropium-albuterol  3 mL Nebulization Q4H  . levalbuterol  0.63 mg Nebulization Q4H  . methylPREDNISolone (SOLU-MEDROL) injection  60 mg Intravenous Q12H  . metoprolol  50 mg Oral BID  . nystatin  5 mL Oral QID  . pantoprazole  40 mg Oral Q1200  . sodium chloride  3 mL Intravenous Q12H  . terbutaline  2.5 mg Oral Q6H      LOS: 1 day   RAI,RIPUDEEP M.D. [facility] 01/05/2014, 9:59 AM Pager: [phone]  If 7PM-7AM, please contact night-coverage [email or web address] Password [email or web address]

## 2014-01-05 NOTE — Plan of Care (Signed)
Problem: Phase I Progression Outcomes Goal: Flu/PneumoVaccines if indicated Outcome: Completed/Met Date Met:  01/05/14 Pt has already had these shots     

## 2014-01-06 LAB — GLUCOSE, CAPILLARY: Glucose-Capillary: 250 mg/dL — ABNORMAL HIGH (ref 70–99)

## 2014-01-06 MED ORDER — LEVALBUTEROL HCL 0.63 MG/3ML IN NEBU: 0.6300 mg | INHALATION_SOLUTION | RESPIRATORY_TRACT | Status: DC

## 2014-01-06 MED ORDER — LEVALBUTEROL HCL 0.63 MG/3ML IN NEBU
INHALATION_SOLUTION | RESPIRATORY_TRACT | Status: AC
  Filled 2014-01-06: qty 3

## 2014-01-06 MED ORDER — ARFORMOTEROL TARTRATE 15 MCG/2ML IN NEBU
15.0000 ug | INHALATION_SOLUTION | Freq: Two times a day (BID) | RESPIRATORY_TRACT | Status: DC
  Administered 2014-01-06 – 2014-01-09 (×7): 15 ug via RESPIRATORY_TRACT
  Filled 2014-01-06 (×9): qty 2

## 2014-01-06 MED ORDER — LEVALBUTEROL HCL 0.63 MG/3ML IN NEBU
0.6300 mg | INHALATION_SOLUTION | RESPIRATORY_TRACT | Status: DC | PRN
  Administered 2014-01-06 – 2014-01-09 (×5): 0.63 mg via RESPIRATORY_TRACT
  Filled 2014-01-06 (×4): qty 3

## 2014-01-06 MED ORDER — BUDESONIDE 0.25 MG/2ML IN SUSP
0.2500 mg | Freq: Two times a day (BID) | RESPIRATORY_TRACT | Status: DC
  Administered 2014-01-06 – 2014-01-09 (×7): 0.25 mg via RESPIRATORY_TRACT
  Filled 2014-01-06 (×9): qty 2

## 2014-01-06 NOTE — Progress Notes (Signed)
Order was written to start Brovana/Pulmicort BID. Pt was still scheduled Xopenex/Atrovent Q4. Per pt's preference, Xopenex was changed to Q4 PRN. He states he liked the BlasdellBrovana before when he took it, and wanted to try it again. Assured pt that if he got into any distress, we would give him PRN treatment. RT will continue to monitor.

## 2014-01-06 NOTE — Progress Notes (Signed)
Patient ID: [name]  male  WUJ:[id number]    DOB: 08-06-1951    DOA: 01/04/2014  PCP: No PCP Per Patient  Assessment/Plan: Principal Problem:   Acute respiratory failure with hypoxia secondary to COPD exacerbation- still wheezy - Continue O2 via [place], nebulizer breathing treatments q4hrs, IV steroids, PPI - Continue Brovana, Pulmicort, placed on nystatin for oral thrush  - influenza panel negative - continue Ceftin   Active Problems:   HYPERTENSION - Currently stable    CAD with chest pain- resolved, echo 2014 had normal EF - Continue aspirin, Plavix, metoprolol - Intolerant to statins  DVT Prophylaxis: SCD's  Code Status:  Family Communication:  Disposition: Not medically ready    Subjective: Still wheezing, no chest pain, nausea vomiting or fevers or chills  Objective: Weight change:   Intake/Output Summary (Last 24 hours) at 01/06/14 1056 Last data filed at 01/06/14 0900  Gross per 24 hour  Intake   1160 ml  Output      0 ml  Net   1160 ml   Blood pressure 121/77, pulse 91, temperature 97.8 F (36.6 C), temperature source Oral, resp. rate 18, height 5\' 11"  (1.803 m), weight 91.173 kg (201 lb), SpO2 95.00%.  Physical Exam: General: Alert and awake, oriented x3 CVS: S1-S2 clear, no murmur rubs or gallops Chest: Bilateral expiratory wheezing  Abdomen: soft nontender, nondistended, normal bowel sounds  Extremities: no cyanosis, clubbing or edema noted bilaterally   Lab Results: Basic Metabolic Panel:  Recent Labs Lab 01/04/14 1326 01/05/14 0845  NA 138 138  K 5.1 4.8  CL 94* 94*  CO2 33* 31  GLUCOSE 94 172*  BUN 10 12  CREATININE 0.86 0.74  CALCIUM 8.7 9.0  MG  --  2.2  PHOS  --  4.3   Liver Function Tests:  Recent Labs Lab 01/05/14 0845  AST 19  ALT 39  ALKPHOS 58  BILITOT 0.3  PROT 6.1  ALBUMIN 2.9*   No results found for this basename: LIPASE, AMYLASE,  in the last 168 hours No results found for this basename: AMMONIA,  in the  last 168 hours CBC:  Recent Labs Lab 01/04/14 1326 01/05/14 0845  WBC 8.1 5.1  NEUTROABS 6.3 4.7  HGB 13.3 12.7*  HCT 40.0 38.1*  MCV 91.5 89.4  PLT PLATELETS APPEAR DECREASED 138*   Cardiac Enzymes:  Recent Labs Lab 01/04/14 2215 01/05/14 0400 01/05/14 0750  TROPONINI <0.30 <0.30 <0.30   BNP: No components found with this basename: POCBNP,  CBG:  Recent Labs Lab 01/06/14 0754  GLUCAP 250*     Micro Results: No results found for this or any previous visit (from the past 240 hour(s)).  Studies/Results: Dg Chest 2 View  01/04/2014   CLINICAL DATA:  Shortness of breath and cough.  Chest pain.  EXAM: CHEST  2 VIEW  COMPARISON:  Single view of the chest 12/26/2013 and CT chest 07/28/2011.  FINDINGS: The lungs are emphysematous but clear. Heart size is normal. No pneumothorax or pleural fluid.  IMPRESSION: Emphysema without acute disease.   Electronically Signed   By: [name] M.D.   On: 01/04/2014 14:08   Dg Chest 2 View  12/15/2013   CLINICAL DATA:  Shortness of breath, chest pain, COPD  EXAM: CHEST  2 VIEW  COMPARISON:  08/25/2013  FINDINGS: Hyperinflation noted compatible with background COPD/ emphysema. No focal pneumonia, edema, effusion or pneumothorax. Trachea midline. Normal heart size and vascularity.  IMPRESSION: Chronic COPD/ emphysema.  No superimposed  acute process   Electronically Signed   By: Ruel Favorsrevor  Shick M.D.   On: 12/15/2013 23:01   Dg Chest Portable 1 View  12/26/2013   CLINICAL DATA:  Shortness of breath  EXAM: PORTABLE CHEST - 1 VIEW  COMPARISON:  12/15/2013 in  FINDINGS: Hyperinflated lungs with apical emphysematous change. Chronic interstitial coarsening at the bases. No acute infiltrate, edema, effusion, or pneumothorax. Normal heart size.  IMPRESSION: COPD without superimposed pneumonia or edema.   Electronically Signed   By: Tiburcio PeaJonathan  Watts M.D.   On: 12/26/2013 02:27    Medications: Scheduled Meds: . antiseptic oral rinse  15 mL Mouth  Rinse BID  . aspirin  650 mg Oral q morning - 10a  . cefUROXime  500 mg Oral BID WC  . clopidogrel  75 mg Oral Daily  . fluticasone  2 spray Each Nare BID  . furosemide  20 mg Oral q morning - 10a  . ipratropium  0.5 mg Nebulization Q4H  . levalbuterol  0.63 mg Nebulization Q4H  . methylPREDNISolone (SOLU-MEDROL) injection  60 mg Intravenous Q12H  . metoprolol  50 mg Oral BID  . nystatin  5 mL Oral QID  . pantoprazole  40 mg Oral Q1200  . sodium chloride  3 mL Intravenous Q12H  . terbutaline  2.5 mg Oral Q6H      LOS: 2 days   RAI,RIPUDEEP M.D. [facility] 01/06/2014, 10:56 AM Pager: [phone]  If 7PM-7AM, please contact night-coverage [email or web address] Password [email or web address]

## 2014-01-06 NOTE — Progress Notes (Signed)
SATURATION QUALIFICATIONS: (This note is used to comply with regulatory documentation for home oxygen)  Patient Saturations on Room Air at Rest = NA  Patient Saturations on Room Air while Ambulating = NA  Patient Saturations on 2 Liters of oxygen while Ambulating = 94%  Please briefly explain why patient needs home oxygen: Patient wears oxygen at home continuously.  Patient requires 2L of O2 while ambulating to maintain oxygen saturation.

## 2014-01-07 ENCOUNTER — Inpatient Hospital Stay: Payer: Self-pay | Admitting: Adult Health

## 2014-01-07 LAB — GLUCOSE, CAPILLARY
GLUCOSE-CAPILLARY: 276 mg/dL — AB (ref 70–99)
Glucose-Capillary: 173 mg/dL — ABNORMAL HIGH (ref 70–99)
Glucose-Capillary: 186 mg/dL — ABNORMAL HIGH (ref 70–99)
Glucose-Capillary: 174 mg/dL — ABNORMAL HIGH (ref 70–99)

## 2014-01-07 LAB — HEMOGLOBIN A1C
Hgb A1c MFr Bld: 6.8 % — ABNORMAL HIGH (ref <5.7)
Mean Plasma Glucose: 148 mg/dL — ABNORMAL HIGH (ref <117)

## 2014-01-07 MED ORDER — METHYLPREDNISOLONE SODIUM SUCC 40 MG IJ SOLR
40.0000 mg | Freq: Two times a day (BID) | INTRAMUSCULAR | Status: DC
  Administered 2014-01-07 – 2014-01-08 (×2): 40 mg via INTRAVENOUS
  Filled 2014-01-07 (×4): qty 1

## 2014-01-07 MED ORDER — INSULIN ASPART 100 UNIT/ML ~~LOC~~ SOLN
0.0000 [IU] | Freq: Three times a day (TID) | SUBCUTANEOUS | Status: DC
  Administered 2014-01-07: 3 [IU] via SUBCUTANEOUS
  Administered 2014-01-08: 8 [IU] via SUBCUTANEOUS
  Administered 2014-01-08: 3 [IU] via SUBCUTANEOUS
  Administered 2014-01-08: 8 [IU] via SUBCUTANEOUS
  Administered 2014-01-09: 3 [IU] via SUBCUTANEOUS

## 2014-01-07 MED ORDER — INSULIN ASPART 100 UNIT/ML ~~LOC~~ SOLN: 0.0000 [IU] | Freq: Every day | SUBCUTANEOUS | Status: DC

## 2014-01-07 NOTE — Care Management Note (Unsigned)
    Page 1 of 1   01/08/2014     10:30:12 AM   CARE MANAGEMENT NOTE 01/08/2014  Patient:  [name],[name]   Account Number:  [id number]  Date Initiated:  01/07/2014  Documentation initiated by:  GRAVES-BIGELOW,Llewellyn Schoenberger  Subjective/Objective Assessment:   Pt admitted for Acute respiratory failure with hypoxia secondary to COPD exacerbation- still wheezing Per MD notes. IV solumedrol initiated.     Action/Plan:   CM will continue to monitor for disposition needs.   Anticipated DC Date:  01/09/2014   Anticipated DC Plan:  HOME/SELF CARE      DC Planning Services  CM consult  Patient refused services      Choice offered to / List presented to:             Status of service:  Completed, signed off Medicare Important Message given?   (If response is "NO", the following Medicare IM given date fields will be blank) Date Medicare IM given:   Date Additional Medicare IM given:    Discharge Disposition:  HOME/SELF CARE  Per UR Regulation:  Reviewed for med. necessity/level of care/duration of stay  If discussed at Long Length of Stay Meetings, dates discussed:    Comments:  01-08-14 1023 Tomi BambergerBrenda Graves-Bigelow, RN,BSN [phone] CM did speak to pt and he lives with daughter. Pt has declined HH services at this time. No further needs from CM at this time.

## 2014-01-07 NOTE — Progress Notes (Signed)
Inpatient Diabetes Program Recommendations  AACE/ADA: New Consensus Statement on Inpatient Glycemic Control (2013)  Target Ranges:  Prepandial:   less than 140 mg/dL      Peak postprandial:   less than 180 mg/dL (1-2 hours)      Critically ill patients:  140 - 180 mg/dL     Results for Haig ProphetDUNN, Hersh G (MRN [id number]) as of 01/07/2014 12:22  Ref. Range 01/06/2014 07:54  Glucose-Capillary Latest Range: 70-99 mg/dL 147250 (H)    Results for Haig ProphetDUNN, Iris G (MRN [id number]) as of 01/07/2014 12:22  Ref. Range 01/07/2014 08:15 01/07/2014 11:18  Glucose-Capillary Latest Range: 70-99 mg/dL 865173 (H) 784276 (H)    Inpatient Diabetes Program Recommendations Correction (SSI): Please add Novolog Moderate SSI tid ac + HS while on IV steroids.   Will follow. Ambrose FinlandJeannine Johnston Carnell Casamento RN, MSN, CDE Diabetes Coordinator Inpatient Diabetes Program Team Pager: [phone] (8a-10p)

## 2014-01-07 NOTE — Progress Notes (Signed)
The Chaplain offered emotional and spiritual support to the patient today. The patient thanked the Chaplain for stopping by visit with him and the Chaplain prayed for the patient at the end of the visit.  Chaplain Bryson HaKwan Voyd Groft

## 2014-01-07 NOTE — Progress Notes (Signed)
Patient ID: Chris ProphetUlysses G Tofte  male  RUE:[id number]    DOB: 06/12/1951    DOA: 01/04/2014  PCP: No PCP Per Patient  Assessment/Plan: Principal Problem:   Acute respiratory failure with hypoxia secondary to COPD exacerbation- still wheezing but improving today  - Continue O2 via [place], nebulizer breathing treatments, patient changed them to PRN (wants brovana and pulmicort scheduled)  - Taper IV steroids, transitioned to oral prednisone in a.m. continue PPI - Continue Brovana, Pulmicort, placed on nystatin for oral thrush  - influenza panel negative - continue Ceftin   Active Problems:   HYPERTENSION - Currently stable    CAD with chest pain- resolved, echo 2014 had normal EF - Continue aspirin, Plavix, metoprolol - Intolerant to statins  DVT Prophylaxis: SCD's  Code Status:  Family Communication:  Disposition: Not medically ready, hopefully in 24-48hrs     Subjective: Still wheezing, but improving   Objective: Weight change:   Intake/Output Summary (Last 24 hours) at 01/07/14 0946 Last data filed at 01/07/14 0700  Gross per 24 hour  Intake    800 ml  Output   2525 ml  Net  -1725 ml   Blood pressure 127/86, pulse 84, temperature 97.8 F (36.6 C), temperature source Oral, resp. rate 18, height 5\' 11"  (1.803 m), weight 91.173 kg (201 lb), SpO2 99.00%.  Physical Exam: General: Alert and awake, oriented x3 CVS: S1-S2 clear Chest: Bilateral expiratory wheezing improving  Abdomen: soft nontender, nondistended, normal bowel sounds  Extremities: no cyanosis, clubbing or edema noted bilaterally   Lab Results: Basic Metabolic Panel:  Recent Labs Lab 01/04/14 1326 01/05/14 0845  NA 138 138  K 5.1 4.8  CL 94* 94*  CO2 33* 31  GLUCOSE 94 172*  BUN 10 12  CREATININE 0.86 0.74  CALCIUM 8.7 9.0  MG  --  2.2  PHOS  --  4.3   Liver Function Tests:  Recent Labs Lab 01/05/14 0845  AST 19  ALT 39  ALKPHOS 58  BILITOT 0.3  PROT 6.1  ALBUMIN 2.9*   No results found  for this basename: LIPASE, AMYLASE,  in the last 168 hours No results found for this basename: AMMONIA,  in the last 168 hours CBC:  Recent Labs Lab 01/04/14 1326 01/05/14 0845  WBC 8.1 5.1  NEUTROABS 6.3 4.7  HGB 13.3 12.7*  HCT 40.0 38.1*  MCV 91.5 89.4  PLT PLATELETS APPEAR DECREASED 138*   Cardiac Enzymes:  Recent Labs Lab 01/04/14 2215 01/05/14 0400 01/05/14 0750  TROPONINI <0.30 <0.30 <0.30   BNP: No components found with this basename: POCBNP,  CBG:  Recent Labs Lab 01/06/14 0754  GLUCAP 250*     Micro Results: No results found for this or any previous visit (from the past 240 hour(s)).  Studies/Results: Dg Chest 2 View  01/04/2014   CLINICAL DATA:  Shortness of breath and cough.  Chest pain.  EXAM: CHEST  2 VIEW  COMPARISON:  Single view of the chest 12/26/2013 and CT chest 07/28/2011.  FINDINGS: The lungs are emphysematous but clear. Heart size is normal. No pneumothorax or pleural fluid.  IMPRESSION: Emphysema without acute disease.   Electronically Signed   By: Drusilla Kannerhomas  Dalessio M.D.   On: 01/04/2014 14:08   Dg Chest 2 View  12/15/2013   CLINICAL DATA:  Shortness of breath, chest pain, COPD  EXAM: CHEST  2 VIEW  COMPARISON:  08/25/2013  FINDINGS: Hyperinflation noted compatible with background COPD/ emphysema. No focal pneumonia, edema, effusion or pneumothorax. Trachea  midline. Normal heart size and vascularity.  IMPRESSION: Chronic COPD/ emphysema.  No superimposed acute process   Electronically Signed   By: Ruel Favors M.D.   On: 12/15/2013 23:01   Dg Chest Portable 1 View  12/26/2013   CLINICAL DATA:  Shortness of breath  EXAM: PORTABLE CHEST - 1 VIEW  COMPARISON:  12/15/2013 in  FINDINGS: Hyperinflated lungs with apical emphysematous change. Chronic interstitial coarsening at the bases. No acute infiltrate, edema, effusion, or pneumothorax. Normal heart size.  IMPRESSION: COPD without superimposed pneumonia or edema.   Electronically Signed   By:  Tiburcio Pea M.D.   On: 12/26/2013 02:27    Medications: Scheduled Meds: . antiseptic oral rinse  15 mL Mouth Rinse BID  . arformoterol  15 mcg Nebulization BID  . aspirin  650 mg Oral q morning - 10a  . budesonide  0.25 mg Nebulization BID  . cefUROXime  500 mg Oral BID WC  . clopidogrel  75 mg Oral Daily  . fluticasone  2 spray Each Nare BID  . furosemide  20 mg Oral q morning - 10a  . methylPREDNISolone (SOLU-MEDROL) injection  60 mg Intravenous Q12H  . metoprolol  50 mg Oral BID  . nystatin  5 mL Oral QID  . pantoprazole  40 mg Oral Q1200  . sodium chloride  3 mL Intravenous Q12H  . terbutaline  2.5 mg Oral Q6H      LOS: 3 days   Sakira Dahmer M.D. [facility] 01/07/2014, 9:46 AM Pager: [phone]  If 7PM-7AM, please contact night-coverage [email or web address] Password [email or web address]

## 2014-01-07 NOTE — Telephone Encounter (Signed)
Pt is currently in hospital-will forward to RB to advise on refill as I do not see Rx on patients med list either.

## 2014-01-07 NOTE — Progress Notes (Signed)
UR Completed Yardley Beltran Graves-Bigelow, RN,BSN [phone]  

## 2014-01-08 LAB — GLUCOSE, CAPILLARY
Glucose-Capillary: 151 mg/dL — ABNORMAL HIGH (ref 70–99)
Glucose-Capillary: 286 mg/dL — ABNORMAL HIGH (ref 70–99)
Glucose-Capillary: 284 mg/dL — ABNORMAL HIGH (ref 70–99)

## 2014-01-08 MED ORDER — FUROSEMIDE 20 MG PO TABS
20.0000 mg | ORAL_TABLET | Freq: Once | ORAL | Status: AC
  Administered 2014-01-08: 20 mg via ORAL
  Filled 2014-01-08: qty 1

## 2014-01-08 MED ORDER — PREDNISONE 50 MG PO TABS
60.0000 mg | ORAL_TABLET | Freq: Once | ORAL | Status: AC
  Administered 2014-01-08: 60 mg via ORAL
  Filled 2014-01-08: qty 1

## 2014-01-08 MED ORDER — PREDNISONE 20 MG PO TABS
40.0000 mg | ORAL_TABLET | Freq: Every day | ORAL | Status: DC
  Administered 2014-01-09: 40 mg via ORAL
  Filled 2014-01-08 (×2): qty 2

## 2014-01-08 NOTE — Progress Notes (Signed)
Physical Therapy Treatment Patient Details Name: [name] MRN: [id number] DOB: 02-10-1951 Today's Date: 01/08/2014 Time: 7829-5621 PT Time Calculation (min): 16 min  PT Assessment / Plan / Recommendation  History of Present Illness 63 y.o. male admitted to [facility] on 01/04/14 with past medical history of COPD on home oxygen, hypertension, CAD who presented to [facility] with worsening shortness of breath and associated chest tightness started over past 24 hours prior to this admission. Pt was recently hospitalized for pneumonia and flu. Dx with acute COPD exacerbation, (-) flu, HTN, and CAD.     PT Comments   Pt is moving at his baseline level of mobility and continues to become dyspneic with gait even on 2 L O2 [place], but was able to maintain sats >93% with 3 standing rest breaks.  He continues to be a candidate for home O2 and home therapy at discharge to help monitor respiratory status and progress activity/exercises.    Follow Up Recommendations  Home health PT;Supervision - Intermittent     Does the patient have the potential to tolerate intense rehabilitation    NA  Barriers to Discharge   None      Equipment Recommendations  None recommended by PT    Recommendations for Other Services   None  Frequency Min 3X/week   Progress towards PT Goals Progress towards PT goals: Progressing toward goals  Plan Current plan remains appropriate    Precautions / Restrictions Precautions Precautions: Other (comment) Precaution Comments: monitor O2 sats with gait.    Pertinent Vitals/Pain O2 sats 93-98% on 2L O2 [place], DOE 2-3/4 with gait.  HR stable.     Mobility  Transfers Overall transfer level: Modified independent Equipment used: None General transfer comment: uses extremities to control transitions Ambulation/Gait Ambulation/Gait assistance: Modified independent (Device/Increase time) Ambulation Distance (Feet): 300 Feet Assistive device:  (pushing portable O2 tank) Gait Pattern/deviations:  Step-through pattern (abnormal gait pattern at baseline) Gait velocity: decreased, 3 standing rest breaks needed due to DOE 2-3/4 General Gait Details: O2 sats ranged from 93-98% on 2 L O2 [place] during gait.  DOE 2-3/4 increased with increased gait distance and O2 sats slowly dropped to 93 % with increased gait distance.  DOE got to the point last 36' pt had to stop talking until he could sit down and rest.     Exercises Other Exercises Other Exercises: educated pt re: energy conservation techniques.      PT Goals (current goals can now be found in the care plan section) Acute Rehab PT Goals Patient Stated Goal: Go home soon  Visit Information  Last PT Received On: 01/08/14 Assistance Needed: +1 History of Present Illness: 63 y.o. male admitted to [facility] on 01/04/14 with past medical history of COPD on home oxygen, hypertension, CAD who presented to [facility] with worsening shortness of breath and associated chest tightness started over past 24 hours prior to this admission. Pt was recently hospitalized for pneumonia and flu. Dx with acute COPD exacerbation, (-) flu, HTN, and CAD.      Subjective Data  Subjective: Pt reports "I am down to 2 L oxygen!" Patient Stated Goal: Go home soon   Cognition  Cognition Arousal/Alertness: Awake/alert Behavior During Therapy: WFL for tasks assessed/performed Overall Cognitive Status: Within Functional Limits for tasks assessed    Balance  Balance Overall balance assessment:  (abnormal gait at baseline, but no LOB)  End of Session PT - End of Session Equipment Utilized During Treatment: Oxygen Activity Tolerance: Other (comment) (limited by DOE)  Patient left: in bed;Other (comment);with call bell/phone within reach (seated EOB)     Lurena Joinerebecca B. Marisah Laker, PT, DPT [phone]   01/08/2014, 5:50 PM

## 2014-01-08 NOTE — Progress Notes (Signed)
Patient ID: [name]  male  ZOX:[id number]    DOB: July 14, 1951    DOA: 01/04/2014  PCP: No PCP Per Patient  Assessment/Plan: Principal Problem:   Acute respiratory failure with hypoxia secondary to COPD exacerbation- still wheezing but improving today  - Continue O2 via [place], nebulizer breathing treatments, patient changed them to PRN (wants brovana and pulmicort scheduled)  - DC IV steroids, transition to oral prednisone today, taper to prednisone 40mg  in a.m. continue PPI - Continue Brovana, Pulmicort, placed on nystatin for oral thrush  - influenza panel negative - continue Ceftin   Active Problems:   HYPERTENSION - Currently stable    CAD with chest pain- resolved, echo 2014 had normal EF - Continue aspirin, Plavix, metoprolol - Intolerant to statins - Patient reports fullness in his abdomen and in swelling in his feet and ankles, will give extra dose of Lasix today  DVT Prophylaxis: SCD's  Code Status:  Family Communication:  Disposition: Hopefully tomorrow morning   Subjective: Wheezing improving, feels that his feet and legs are more swollen, no fever chills or chest pain  Objective: Weight change:   Intake/Output Summary (Last 24 hours) at 01/08/14 1028 Last data filed at 01/08/14 0732  Gross per 24 hour  Intake   1063 ml  Output   3300 ml  Net  -2237 ml   Blood pressure 125/81, pulse 79, temperature 97.4 F (36.3 C), temperature source Oral, resp. rate 18, height 5\' 11"  (1.803 m), weight 77.111 kg (170 lb), SpO2 99.00%.  Physical Exam: General: Alert and awake, oriented x3 CVS: S1-S2 clear Chest: Bilateral expiratory wheezing improving  Abdomen: soft nontender, nondistended, normal bowel sounds  Extremities: no cyanosis, clubbing. trace edema noted bilaterally   Lab Results: Basic Metabolic Panel:  Recent Labs Lab 01/04/14 1326 01/05/14 0845  NA 138 138  K 5.1 4.8  CL 94* 94*  CO2 33* 31  GLUCOSE 94 172*  BUN 10 12  CREATININE 0.86 0.74   CALCIUM 8.7 9.0  MG  --  2.2  PHOS  --  4.3   Liver Function Tests:  Recent Labs Lab 01/05/14 0845  AST 19  ALT 39  ALKPHOS 58  BILITOT 0.3  PROT 6.1  ALBUMIN 2.9*   No results found for this basename: LIPASE, AMYLASE,  in the last 168 hours No results found for this basename: AMMONIA,  in the last 168 hours CBC:  Recent Labs Lab 01/04/14 1326 01/05/14 0845  WBC 8.1 5.1  NEUTROABS 6.3 4.7  HGB 13.3 12.7*  HCT 40.0 38.1*  MCV 91.5 89.4  PLT PLATELETS APPEAR DECREASED 138*   Cardiac Enzymes:  Recent Labs Lab 01/04/14 2215 01/05/14 0400 01/05/14 0750  TROPONINI <0.30 <0.30 <0.30   BNP: No components found with this basename: POCBNP,  CBG:  Recent Labs Lab 01/07/14 0815 01/07/14 1118 01/07/14 1628 01/07/14 2053 01/08/14 0732  GLUCAP 173* 276* 186* 174* 151*     Micro Results: No results found for this or any previous visit (from the past 240 hour(s)).  Studies/Results: Dg Chest 2 View  01/04/2014   CLINICAL DATA:  Shortness of breath and cough.  Chest pain.  EXAM: CHEST  2 VIEW  COMPARISON:  Single view of the chest 12/26/2013 and CT chest 07/28/2011.  FINDINGS: The lungs are emphysematous but clear. Heart size is normal. No pneumothorax or pleural fluid.  IMPRESSION: Emphysema without acute disease.   Electronically Signed   By: [name] M.D.   On: 01/04/2014 14:08  Dg Chest 2 View  12/15/2013   CLINICAL DATA:  Shortness of breath, chest pain, COPD  EXAM: CHEST  2 VIEW  COMPARISON:  08/25/2013  FINDINGS: Hyperinflation noted compatible with background COPD/ emphysema. No focal pneumonia, edema, effusion or pneumothorax. Trachea midline. Normal heart size and vascularity.  IMPRESSION: Chronic COPD/ emphysema.  No superimposed acute process   Electronically Signed   By: Ruel Favorsrevor  Shick M.D.   On: 12/15/2013 23:01   Dg Chest Portable 1 View  12/26/2013   CLINICAL DATA:  Shortness of breath  EXAM: PORTABLE CHEST - 1 VIEW  COMPARISON:  12/15/2013 in   FINDINGS: Hyperinflated lungs with apical emphysematous change. Chronic interstitial coarsening at the bases. No acute infiltrate, edema, effusion, or pneumothorax. Normal heart size.  IMPRESSION: COPD without superimposed pneumonia or edema.   Electronically Signed   By: Tiburcio PeaJonathan  Watts M.D.   On: 12/26/2013 02:27    Medications: Scheduled Meds: . antiseptic oral rinse  15 mL Mouth Rinse BID  . arformoterol  15 mcg Nebulization BID  . aspirin  650 mg Oral q morning - 10a  . budesonide  0.25 mg Nebulization BID  . cefUROXime  500 mg Oral BID WC  . clopidogrel  75 mg Oral Daily  . fluticasone  2 spray Each Nare BID  . furosemide  20 mg Oral q morning - 10a  . furosemide  20 mg Oral Once  . insulin aspart  0-15 Units Subcutaneous TID WC  . insulin aspart  0-5 Units Subcutaneous QHS  . metoprolol  50 mg Oral BID  . nystatin  5 mL Oral QID  . pantoprazole  40 mg Oral Q1200  . [START ON 01/09/2014] predniSONE  40 mg Oral Q breakfast  . predniSONE  60 mg Oral Once  . sodium chloride  3 mL Intravenous Q12H  . terbutaline  2.5 mg Oral Q6H      LOS: 4 days   RAI,RIPUDEEP M.D. [facility] 01/08/2014, 10:28 AM Pager: [phone]  If 7PM-7AM, please contact night-coverage [email or web address] Password [email or web address]

## 2014-01-08 NOTE — Progress Notes (Signed)
CSW not involved in GOLD COPD protocol. Clinical Social Worker will sign off for now as social work intervention is no longer needed. Please consult us again if new need arises.   Sabino NiemannAmy Fareeda Downard, MSW, [employer] IncLCSWA [phone]

## 2014-01-08 NOTE — Progress Notes (Signed)
OT EVALUATION  Clinical Impression: PTA, pt lived with daughter and was independent with ADL and mobility. Pt with recent hospital admission. Completed education with pt regarding use of AE and DME for ADL. Pt has AE and DME at home. Educated pt on E conservation  - given written information. Pt will be able to D/C home with intermittent S of daughter. Pt had questions regarding diet and the role of his diet with COPD/CHF. Discussed with nsg and rec nutritional consult.  Consults: Nutritional  01/08/14 1000  OT Visit Information  Last OT Received On 01/08/14  Assistance Needed +1  History of Present Illness pt returns after recent admition of COPD exacerbation with worsening   Precautions  Precautions Fall  Precaution Comments limited by SOB  Restrictions  Weight Bearing Restrictions No  Home Living  Family/patient expects to be discharged to: Private residence  Living Arrangements Children  Available Help at Discharge Available 24 hours/day  Type of Home Apartment  Home Access Level entry  Home Layout One level  Bathroom Shower/Tub Tub/shower unit  Shower/tub characteristics Curtain  FirefighterBathroom Toilet Handicapped height  Bathroom Accessibility Yes  How Accessible Accessible via walker  Home Equipment [place] - single point;Other (comment);Walker - 2 wheels  Prior Function  Level of Independence Independent  Communication  Communication No difficulties  Cognition  Arousal/Alertness Awake/alert  Behavior During Therapy WFL for tasks assessed/performed  Overall Cognitive Status Within Functional Limits for tasks assessed  Upper Extremity Assessment  Upper Extremity Assessment Overall WFL for tasks assessed  Lower Extremity Assessment  Lower Extremity Assessment RLE deficits/detail  RLE Deficits / Details R hip problem - premorbid  Cervical / Trunk Assessment  Cervical / Trunk Assessment Normal  ADL  Grooming Modified independent  Where Assessed - Grooming Unsupported standing   Upper Body Bathing Supervision/safety  Where Assessed - Upper Body Bathing Unsupported sit to stand  Lower Body Bathing Set up  Where Assessed - Lower Body Bathing Unsupported sit to stand  Upper Body Dressing Set up  Where Assessed - Upper Body Dressing Unsupported sitting  Lower Body Dressing Minimal assistance  Where Assessed - Lower Body Dressing Unsupported sit to stand  Toilet Transfer Modified independent  Toilet Transfer Method Other (comment) (ambulating)  Toileting - Clothing Manipulation and Hygiene Modified independent  Where Assessed - Toileting Clothing Manipulation and Hygiene Sit to stand from 3-in-1 or toilet  Transfers/Ambulation Related to ADLs mod I  ADL Comments Reviewed E conservation techniques and use of AE  Vision - History  Baseline Vision No visual deficits  Perception  Perception WFL  Praxis  Praxis Intact  Bed Mobility  Overal bed mobility Modified Independent  Transfers  Overall transfer level Modified independent  Balance  Overall balance assessment No apparent balance deficits (not formally assessed)  OT - End of Session  Equipment Utilized During Treatment Oxygen  Activity Tolerance Patient tolerated treatment well  Patient left with call bell/phone within reach;in bed  Nurse Communication Mobility status  OT Assessment  OT Recommendation/Assessment Patient does not need any further OT services  OT Problem List Cardiopulmonary status limiting activity;Increased edema  OT Therapy Diagnosis  Generalized weakness  OT Plan  OT Frequency (eval only)  OT Treatment/Interventions Self-care/ADL training;DME and/or AE instruction;Energy conservation  OT Recommendation  Follow Up Recommendations No OT follow up;Supervision - Intermittent  OT Equipment Tub/shower seat;Other (comment) (pt can borrow seat from family)  Individuals Consulted  Consulted and Agree with Results and Recommendations Patient  Acute Rehab OT Goals  Patient  Stated Goal Go  home soon  OT Time Calculation  OT Start Time 1033  OT Stop Time 1059  OT Time Calculation (min) 26 min  OT General Charges  $OT Visit 1 Procedure  OT Evaluation  $Initial OT Evaluation Tier I 1 Procedure  OT Treatments  $Self Care/Home Management  8-22 mins  [facility], OTR/L  [phone] 01/08/2014

## 2014-01-09 LAB — GLUCOSE, CAPILLARY
GLUCOSE-CAPILLARY: 171 mg/dL — AB (ref 70–99)
Glucose-Capillary: 135 mg/dL — ABNORMAL HIGH (ref 70–99)

## 2014-01-09 MED ORDER — OXYCODONE-ACETAMINOPHEN 5-325 MG PO TABS: 1.0000 | ORAL_TABLET | ORAL | Status: DC | PRN

## 2014-01-09 MED ORDER — CEFUROXIME AXETIL 500 MG PO TABS: 500.0000 mg | ORAL_TABLET | Freq: Two times a day (BID) | ORAL | Status: DC

## 2014-01-09 MED ORDER — NYSTATIN 100000 UNIT/ML MT SUSP: 5.0000 mL | Freq: Four times a day (QID) | OROMUCOSAL | Status: DC

## 2014-01-09 MED ORDER — PREDNISONE 20 MG PO TABS: ORAL_TABLET | ORAL | Status: DC

## 2014-01-09 MED ORDER — LEVALBUTEROL HCL 0.63 MG/3ML IN NEBU
0.6300 mg | INHALATION_SOLUTION | RESPIRATORY_TRACT | Status: DC | PRN
Start: 2014-01-09 — End: 2014-02-27

## 2014-01-09 NOTE — Telephone Encounter (Signed)
OK to refill

## 2014-01-09 NOTE — Discharge Summary (Signed)
Physician Discharge Summary  TEDRIC LEETH ZOX:[id number] DOB: [birth year] DOA: 01/04/2014  PCP: No PCP Per Patient  Admit date: 01/04/2014 Discharge date: 01/09/2014  Time spent: 35 minutes  Recommendations for Outpatient Follow-up:  1. Follow up with DR Byrum for further medications management.   Discharge Diagnoses:  Principal Problem:   COPD exacerbation Active Problems:   HYPERTENSION   CAD   Acute respiratory failure with hypoxia   Chest pain   Discharge Condition: stable.   Diet recommendation: Health Healthy diet.   Filed Weights   01/04/14 1314 01/08/14 0433 01/09/14 0642  Weight: 91.173 kg (201 lb) 77.111 kg (170 lb) 87.363 kg (192 lb 9.6 oz)    History of present illness:  63 year old male with past medical history of COPD on home oxygen, hypertension, CAD who presented to [facility] with worsening shortness of breath and associated chest tightness started over past 24 hours prior to this admission. Pt was recently hospitalized for pneumonia and flu. He did not have associated productive cough, no fever or chills. No palpitations. His chest pain was retrosternal, 7-8/10 ini intensity, non radiating, at rest and has resolved with aspirin and nitro. No abdominal pain, nausea or vomiting. No blood in stool or urine. No lightheadedness or loss of consciousness.  In ED, vitals are stable with BP of 108/80 and then 152/82; HR 85 and T max 97.7 F, oxygen saturation 94% on 2 L [place] oxygen support. Pt was given 1 time nebulizer treatment in ED and continued to experience shortness of breath. BMP and CBC essentially unremarkable. CXR showed COPD/emphysema but no acute disease.   Hospital Course:  Acute respiratory failure with hypoxia secondary to COPD exacerbation- improved.  -Patient admitted with dyspnea, wheezing. Was treated for COPD exacerbation with IV solumedrol and nebulizer treatments.  - Continue O2 via [place], nebulizer breathing treatments, patient changed them to PRN ( brovana and  pulmicort scheduled)  - DC IV steroids, transition to oral prednisone today, taper to prednisone continue PPI  - Continue Brovana, Pulmicort, placed on nystatin for oral thrush  - influenza panel negative  - continue Ceftin   HYPERTENSION  - Currently stable  CAD with chest pain- resolved, echo 2014 had normal EF  - Continue aspirin, Plavix, metoprolol  - Intolerant to statins     Procedures: none  Consultations:  none  Discharge Exam: Filed Vitals:   01/09/14 0642  BP: 120/80  Pulse: 79  Temp: 97.7 F (36.5 C)  Resp:     General: no distress.  Cardiovascular: S 1, S 2 RRR Respiratory: CTA  Discharge Instructions  Discharge Orders   Future Appointments Provider Department Dept Phone   01/10/2014 11:30 AM [facility] Covering Provider [facility] [phone]   01/25/2014 10:15 AM Waymon Budge, MD [facility] [phone]   Future Orders Complete By Expires   Diet - low sodium heart healthy  As directed    Increase activity slowly  As directed        Medication List    STOP taking these medications       aspirin-sod bicarb-citric acid 325 MG Tbef tablet  Commonly known as:  ALKA-SELTZER     HYDROcodone-acetaminophen 5-325 MG per tablet  Commonly known as:  NORCO/VICODIN     ipratropium-albuterol 0.5-2.5 (3) MG/3ML Soln  Commonly known as:  DUONEB      TAKE these medications       arformoterol 15 MCG/2ML Nebu  Commonly known as:  BROVANA  Take 2 mLs (15 mcg total) by nebulization 2 (two) times daily.     aspirin 325 MG tablet  Take 650 mg by mouth every morning.     budesonide 0.25 MG/2ML nebulizer solution  Commonly known as:  PULMICORT  Take 0.25 mg by nebulization 2 (two) times daily.     cefUROXime 500 MG tablet  Commonly known as:  CEFTIN  Take 1 tablet (500 mg total) by mouth 2 (two) times daily with a meal.     clopidogrel 75 MG tablet  Commonly known as:  PLAVIX  Take 1 tablet (75 mg total) by  mouth daily.     fluconazole 100 MG tablet  Commonly known as:  DIFLUCAN  Take 1 tablet (100 mg total) by mouth daily. For 5 more days     fluticasone 50 MCG/ACT nasal spray  Commonly known as:  FLONASE  Place 2 sprays into both nostrils 2 (two) times daily.     furosemide 20 MG tablet  Commonly known as:  LASIX  Take 1 tablet (20 mg total) by mouth every morning.     levalbuterol 0.63 MG/3ML nebulizer solution  Commonly known as:  XOPENEX  Take 3 mLs (0.63 mg total) by nebulization every 4 (four) hours as needed for wheezing or shortness of breath.     metoprolol 50 MG tablet  Commonly known as:  LOPRESSOR  Take 1 tablet (50 mg total) by mouth 2 (two) times daily.     nystatin 100000 UNIT/ML suspension  Commonly known as:  MYCOSTATIN  Take 5 mLs (500,000 Units total) by mouth 4 (four) times daily.     oxyCODONE-acetaminophen 5-325 MG per tablet  Commonly known as:  PERCOCET/ROXICET  Take 1 tablet by mouth every 4 (four) hours as needed for severe pain.     pantoprazole 40 MG tablet  Commonly known as:  PROTONIX  Take 1 tablet (40 mg total) by mouth daily at 12 noon.     predniSONE 20 MG tablet  Commonly known as:  DELTASONE  Take 3 tablet for 3 days then 2 tablet for 3 days then 1 tablet     terbutaline 2.5 MG tablet  Commonly known as:  BRETHINE  Take 1 tablet (2.5 mg total) by mouth every 6 (six) hours.     traZODone 50 MG tablet  Commonly known as:  DESYREL  Take 1 tablet (50 mg total) by mouth at bedtime as needed for sleep.       Allergies  Allergen Reactions  . Lipitor [Atorvastatin Calcium] Other (See Comments)    Muscle aches      The results of significant diagnostics from this hospitalization (including imaging, microbiology, ancillary and laboratory) are listed below for reference.    Significant Diagnostic Studies: Dg Chest 2 View  01/04/2014   CLINICAL DATA:  Shortness of breath and cough.  Chest pain.  EXAM: CHEST  2 VIEW  COMPARISON:  Single  view of the chest 12/26/2013 and CT chest 07/28/2011.  FINDINGS: The lungs are emphysematous but clear. Heart size is normal. No pneumothorax or pleural fluid.  IMPRESSION: Emphysema without acute disease.   Electronically Signed   By: [name] M.D.   On: 01/04/2014 14:08   Dg Chest 2 View  12/15/2013   CLINICAL DATA:  Shortness of breath, chest pain, COPD  EXAM: CHEST  2 VIEW  COMPARISON:  08/25/2013  FINDINGS: Hyperinflation noted compatible with background COPD/ emphysema. No focal pneumonia, edema, effusion or pneumothorax. Trachea midline. Normal heart  size and vascularity.  IMPRESSION: Chronic COPD/ emphysema.  No superimposed acute process   Electronically Signed   By: Ruel Favorsrevor  Shick M.D.   On: 12/15/2013 23:01   Dg Chest Portable 1 View  12/26/2013   CLINICAL DATA:  Shortness of breath  EXAM: PORTABLE CHEST - 1 VIEW  COMPARISON:  12/15/2013 in  FINDINGS: Hyperinflated lungs with apical emphysematous change. Chronic interstitial coarsening at the bases. No acute infiltrate, edema, effusion, or pneumothorax. Normal heart size.  IMPRESSION: COPD without superimposed pneumonia or edema.   Electronically Signed   By: Tiburcio PeaJonathan  Watts M.D.   On: 12/26/2013 02:27    Microbiology: No results found for this or any previous visit (from the past 240 hour(s)).   Labs: Basic Metabolic Panel:  Recent Labs Lab 01/04/14 1326 01/05/14 0845  NA 138 138  K 5.1 4.8  CL 94* 94*  CO2 33* 31  GLUCOSE 94 172*  BUN 10 12  CREATININE 0.86 0.74  CALCIUM 8.7 9.0  MG  --  2.2  PHOS  --  4.3   Liver Function Tests:  Recent Labs Lab 01/05/14 0845  AST 19  ALT 39  ALKPHOS 58  BILITOT 0.3  PROT 6.1  ALBUMIN 2.9*   No results found for this basename: LIPASE, AMYLASE,  in the last 168 hours No results found for this basename: AMMONIA,  in the last 168 hours CBC:  Recent Labs Lab 01/04/14 1326 01/05/14 0845  WBC 8.1 5.1  NEUTROABS 6.3 4.7  HGB 13.3 12.7*  HCT 40.0 38.1*  MCV 91.5 89.4   PLT PLATELETS APPEAR DECREASED 138*   Cardiac Enzymes:  Recent Labs Lab 01/04/14 1326 01/04/14 2215 01/05/14 0400 01/05/14 0750  TROPONINI <0.30 <0.30 <0.30 <0.30   BNP: BNP (last 3 results)  Recent Labs  02/07/13 1025 12/15/13 2200 12/26/13 0203  PROBNP 192.9* 78.6 100.5   CBG:  Recent Labs Lab 01/08/14 0732 01/08/14 1137 01/08/14 1645 01/08/14 2225 01/09/14 0739  GLUCAP 151* 286* 284* 135* 171*       Signed:  Mycal Conde  [facility] 01/09/2014, 10:13 AM

## 2014-01-09 NOTE — Progress Notes (Signed)
Pt provided with dc instructions and education. Pt verbalized understanding. Pt has no questions at this time. IV removed with tip intact. Heart mointor cleaned and returned to front. Levonne Spillerhasidy Amontae Ng, RN

## 2014-01-09 NOTE — Progress Notes (Addendum)
Noted HgbA1C of 6.8%. "At Risk" for DM range is 5.7-6.4%  May want to alert patient of this A1C and PCP and indications of Dx of DM.  Pt may benefit from education and RD (Dietician) consult while here. Included exit care handout for patient regarding HgbA1C.  Thank you, Lenor CoffinAnn Jakita Dutkiewicz, RN, CNS, Diabetes Coordinator [phone])

## 2014-01-09 NOTE — Telephone Encounter (Signed)
Pt is still currently admitted. Not sure what med is needed, he has duoneb and also xopenex on med list. I called and spoke with family member and advised to have someone call us if med is still needed.Carron CurieJennifer Castillo, CMA

## 2014-01-10 ENCOUNTER — Ambulatory Visit: Payer: Medicare Other | Attending: Internal Medicine | Admitting: Internal Medicine

## 2014-01-10 ENCOUNTER — Encounter: Payer: Self-pay | Admitting: Internal Medicine

## 2014-01-10 VITALS — BP 114/76 | HR 80 | Temp 98.9°F | Resp 14 | Ht 71.0 in | Wt 209.4 lb

## 2014-01-10 DIAGNOSIS — J440 Chronic obstructive pulmonary disease with acute lower respiratory infection: Secondary | ICD-10-CM

## 2014-01-10 DIAGNOSIS — I5032 Chronic diastolic (congestive) heart failure: Secondary | ICD-10-CM | POA: Insufficient documentation

## 2014-01-10 DIAGNOSIS — J209 Acute bronchitis, unspecified: Secondary | ICD-10-CM

## 2014-01-10 DIAGNOSIS — E119 Type 2 diabetes mellitus without complications: Secondary | ICD-10-CM

## 2014-01-10 DIAGNOSIS — I25810 Atherosclerosis of coronary artery bypass graft(s) without angina pectoris: Secondary | ICD-10-CM

## 2014-01-10 DIAGNOSIS — J441 Chronic obstructive pulmonary disease with (acute) exacerbation: Secondary | ICD-10-CM | POA: Insufficient documentation

## 2014-01-10 LAB — POCT URINALYSIS DIPSTICK
BILIRUBIN UA: NEGATIVE
GLUCOSE UA: NEGATIVE
KETONES UA: NEGATIVE
Leukocytes, UA: NEGATIVE
Nitrite, UA: NEGATIVE
Protein, UA: NEGATIVE
RBC UA: NEGATIVE
SPEC GRAV UA: 1.01
Urobilinogen, UA: 0.2
pH, UA: 5.5

## 2014-01-10 LAB — COMPLETE METABOLIC PANEL WITH GFR
ALT: 40 U/L (ref 0–53)
AST: 18 U/L (ref 0–37)
Albumin: 3.5 g/dL (ref 3.5–5.2)
Alkaline Phosphatase: 52 U/L (ref 39–117)
BUN: 17 mg/dL (ref 6–23)
CALCIUM: 8.5 mg/dL (ref 8.4–10.5)
CHLORIDE: 95 meq/L — AB (ref 96–112)
CO2: 38 mEq/L — ABNORMAL HIGH (ref 19–32)
CREATININE: 0.85 mg/dL (ref 0.50–1.35)
GFR, Est Non African American: >89 mL/min
Glucose, Bld: 221 mg/dL — ABNORMAL HIGH (ref 70–99)
Potassium: 3.9 mEq/L (ref 3.5–5.3)
Sodium: 140 mEq/L (ref 135–145)
Total Bilirubin: 0.4 mg/dL (ref 0.3–1.2)
Total Protein: 5.5 g/dL — ABNORMAL LOW (ref 6.0–8.3)

## 2014-01-10 LAB — LIPID PANEL
CHOLESTEROL: 208 mg/dL — AB (ref 0–200)
HDL: 92 mg/dL (ref >39)
LDL Cholesterol: 50 mg/dL (ref 0–99)
TRIGLYCERIDES: 329 mg/dL — AB (ref <150)
Total CHOL/HDL Ratio: 2.3 Ratio
VLDL: 66 mg/dL — AB (ref 0–40)

## 2014-01-10 LAB — POCT GLYCOSYLATED HEMOGLOBIN (HGB A1C): Hemoglobin A1C: 6.2

## 2014-01-10 NOTE — Progress Notes (Signed)
Patient ID: [name], male   DOB: 08/28/1951, 63 y.o.   MRN: [id number]   CC:  HPI: 63 yo man with PMH of severe COPD on 2 L of oxygen at home, CAD with prior stents in '06, '08, last clean LHC 2010, negative nuclear stress 2011, last known EF 55-60% on 02/14 echo, dyslipidemia, hypertension, chronic tobacco abuse who was recently hospitalized 3 times with several days of cough, subjective fevers, chills, and shortness of breath and chest tightness more , diagnosed with influenza A and COPD exacerbation.   [name] has been on a steroid burst for COPD with slow improvement, taking several nebulizers he had 3 admissions in December and January, was treated with IV solumedrol, 2g IV magnesium and albuterol nebulizer. Cardiology consulted because of ECG with odd baseline in inferior leads. All 3 troponin negative. Treatment for COPD exacerbation and community acquired pneumonia, still on prednisone taper. He has chronic SOB and chest pain has resolved, he states that the chest pain felt like a muscle spasm -   . He's currently CP free. He does not associate the chest pain with deep breathing. The pain occurs with walking or at rest.     Allergies  Allergen Reactions  . Lipitor [Atorvastatin Calcium] Other (See Comments)    Muscle aches   Past Medical History  Diagnosis Date  . COPD (chronic obstructive pulmonary disease)   . CAD (coronary artery disease)   . Hypertension   . Hyperlipidemia   . Osteomyelitis   . Myocardial infarction     "I've had a couple since 2006" (12/26/2013)  . Anginal pain   . Asthma   . Pneumonia     "have had it several times; maybe now too": (12/26/2013)  . Shortness of breath     "all the time" (12/26/2013)  . On home oxygen therapy     "2-3L; 24/7" (12/26/2013)  . GERD (gastroesophageal reflux disease)   . Daily headache   . Stroke     "they say I've had a couple" denies residual on 12/26/2013  . Arthritis     "hands; feet" (12/26/2013)  . Osteomyelitis hip      "right"  . Chronic lower back pain    Current Outpatient Prescriptions on File Prior to Visit  Medication Sig Dispense Refill  . arformoterol (BROVANA) 15 MCG/2ML NEBU Take 2 mLs (15 mcg total) by nebulization 2 (two) times daily.  120 mL  0  . aspirin 325 MG tablet Take 650 mg by mouth every morning.       . budesonide (PULMICORT) 0.25 MG/2ML nebulizer solution Take 0.25 mg by nebulization 2 (two) times daily.      . cefUROXime (CEFTIN) 500 MG tablet Take 1 tablet (500 mg total) by mouth 2 (two) times daily with a meal.  6 tablet  0  . clopidogrel (PLAVIX) 75 MG tablet Take 1 tablet (75 mg total) by mouth daily.  31 tablet  0  . fluconazole (DIFLUCAN) 100 MG tablet Take 1 tablet (100 mg total) by mouth daily. For 5 more days  5 tablet  0  . fluticasone (FLONASE) 50 MCG/ACT nasal spray Place 2 sprays into both nostrils 2 (two) times daily.  16 g  2  . furosemide (LASIX) 20 MG tablet Take 1 tablet (20 mg total) by mouth every morning.  30 tablet  0  . levalbuterol (XOPENEX) 0.63 MG/3ML nebulizer solution Take 3 mLs (0.63 mg total) by nebulization every 4 (four) hours as needed for  wheezing or shortness of breath.  3 mL  12  . metoprolol (LOPRESSOR) 50 MG tablet Take 1 tablet (50 mg total) by mouth 2 (two) times daily.  60 tablet  6  . nystatin (MYCOSTATIN) 100000 UNIT/ML suspension Take 5 mLs (500,000 Units total) by mouth 4 (four) times daily.  60 mL  0  . oxyCODONE-acetaminophen (PERCOCET/ROXICET) 5-325 MG per tablet Take 1 tablet by mouth every 4 (four) hours as needed for severe pain.  30 tablet  0  . pantoprazole (PROTONIX) 40 MG tablet Take 1 tablet (40 mg total) by mouth daily at 12 noon.  30 tablet  0  . predniSONE (DELTASONE) 20 MG tablet Take 3 tablet for 3 days then 2 tablet for 3 days then 1 tablet  20 tablet  0  . terbutaline (BRETHINE) 2.5 MG tablet Take 1 tablet (2.5 mg total) by mouth every 6 (six) hours.  120 tablet  1  . traZODone (DESYREL) 50 MG tablet Take 1 tablet (50 mg  total) by mouth at bedtime as needed for sleep.  20 tablet  0   No current facility-administered medications on file prior to visit.   Family History  Problem Relation Age of Onset  . Coronary artery disease    . Hypertension     History   Social History  . Marital Status: Married    Spouse Name: N/A    Number of Children: N/A  . Years of Education: N/A   Occupational History  . Disabled    Social History Main Topics  . Smoking status: Former Smoker -- 3.00 packs/day for 51 years    Types: Cigarettes    Quit date: 11/19/2013  . Smokeless tobacco: Never Used  . Alcohol Use: No  . Drug Use: No  . Sexual Activity: Not Currently   Other Topics Concern  . Not on file   Social History Narrative  . No narrative on file    Review of Systems  Constitutional: As in history of present illness HENT: Negative for ear pain, nosebleeds, congestion, facial swelling, rhinorrhea, neck pain, neck stiffness and ear discharge.   Eyes: Negative for pain, discharge, redness, itching and visual disturbance.  Respiratory: Negative for cough, choking, chest tightness, shortness of breath, wheezing and stridor.   Cardiovascular: Negative for chest pain, palpitations and leg swelling.  Gastrointestinal: Negative for abdominal distention.  Genitourinary: Negative for dysuria, urgency, frequency, hematuria, flank pain, decreased urine volume, difficulty urinating and dyspareunia.  Musculoskeletal: Negative for back pain, joint swelling, arthralgias and gait problem.  Neurological: Negative for dizziness, tremors, seizures, syncope, facial asymmetry, speech difficulty, weakness, light-headedness, numbness and headaches.  Hematological: Negative for adenopathy. Does not bruise/bleed easily.  Psychiatric/Behavioral: Negative for hallucinations, behavioral problems, confusion, dysphoric mood, decreased concentration and agitation.    Objective:  There were no vitals filed for this visit.  Physical  Exam  Constitutional: Appears well-developed and well-nourished. No distress.  HENT: Normocephalic. External right and left ear normal. Oropharynx is clear and moist.  Eyes: Conjunctivae and EOM are normal. PERRLA, no scleral icterus.  Neck: Normal ROM. Neck supple. No JVD. No tracheal deviation. No thyromegaly.  CVS: RRR, S1/S2 +, no murmurs, no gallops, no carotid bruit.  Pulmonary: Effort and breath sounds normal, no stridor, rhonchi, wheezes, rales.  Abdominal: Soft. BS +,  no distension, tenderness, rebound or guarding.  Musculoskeletal: Normal range of motion. No edema and no tenderness.  Lymphadenopathy: No lymphadenopathy noted, cervical, inguinal. Neuro: Alert. Normal reflexes, muscle tone coordination. No cranial  nerve deficit. Skin: Skin is warm and dry. No rash noted. Not diaphoretic. No erythema. No pallor.  Psychiatric: Normal mood and affect. Behavior, judgment, thought content normal.   Lab Results  Component Value Date   WBC 5.1 01/05/2014   HGB 12.7* 01/05/2014   HCT 38.1* 01/05/2014   MCV 89.4 01/05/2014   PLT 138* 01/05/2014   Lab Results  Component Value Date   CREATININE 0.74 01/05/2014   BUN 12 01/05/2014   NA 138 01/05/2014   K 4.8 01/05/2014   CL 94* 01/05/2014   CO2 31 01/05/2014    Lab Results  Component Value Date   HGBA1C 6.8* 01/07/2014   Lipid Panel     Component Value Date/Time   CHOL 203* 10/12/2011 0504   TRIG 64 10/12/2011 0504   HDL 59 10/12/2011 0504   CHOLHDL 3.4 10/12/2011 0504   VLDL 13 10/12/2011 0504   LDLCALC 131* 10/12/2011 0504       Assessment and plan:   Patient Active Problem List   Diagnosis Date Noted  . Chest pain 01/04/2014  . COPD exacerbation 08/26/2013  . Acute respiratory failure with hypoxia 02/07/2013  . TOBACCO USER 02/11/2010  . HYPERTENSION 08/07/2009  . CAD 08/07/2009   COPD exacerbation    Improving current medications seem to be helping He has an outpatient pulmonologist and had a pulmonary consult in the  hospital because of 3 admissions back-to-back Still on prednisone taper at 40 mg a day, maintenance dose of 10 mg a day, home oxygen dependent    Chest pain now chest pain-free Encouraged to follow up with cardiology for outpatient stress test    Thrush resolved on fluconazole  Chronic diastolic heart failure currently on Lasix does not complain of any symptoms of exacerbation   History of urinary retention Urology referral provided, check urine dipstick   Follow up in 2 months  The patient was given clear instructions to go to ER or return to medical center if symptoms don't improve, worsen or new problems develop. The patient verbalized understanding. The patient was told to call to get any lab results if not heard anything in the next week.

## 2014-01-10 NOTE — Progress Notes (Signed)
Pt is here for a hospital f/u for COPD. Complains of pain in his back still. Constant pain in hip and back. Pain while breathing in the middle of his back.

## 2014-01-11 ENCOUNTER — Telehealth: Payer: Self-pay | Admitting: *Deleted

## 2014-01-11 LAB — CBC WITH DIFFERENTIAL/PLATELET
Basophils Absolute: 0 10*3/uL (ref 0.0–0.1)
Basophils Relative: 0 % (ref 0–1)
EOS ABS: 0 10*3/uL (ref 0.0–0.7)
EOS PCT: 0 % (ref 0–5)
HCT: 39.6 % (ref 39.0–52.0)
HEMOGLOBIN: 13.2 g/dL (ref 13.0–17.0)
LYMPHS ABS: 1.1 10*3/uL (ref 0.7–4.0)
Lymphocytes Relative: 11 % — ABNORMAL LOW (ref 12–46)
MCH: 29.5 pg (ref 26.0–34.0)
MCHC: 33.3 g/dL (ref 30.0–36.0)
MCV: 88.4 fL (ref 78.0–100.0)
MONOS PCT: 4 % (ref 3–12)
Monocytes Absolute: 0.4 10*3/uL (ref 0.1–1.0)
Neutro Abs: 8.4 10*3/uL — ABNORMAL HIGH (ref 1.7–7.7)
Neutrophils Relative %: 85 % — ABNORMAL HIGH (ref 43–77)
Platelets: 155 10*3/uL (ref 150–400)
RBC: 4.48 MIL/uL (ref 4.22–5.81)
RDW: 15.1 % (ref 11.5–15.5)
WBC: 10 10*3/uL (ref 4.0–10.5)

## 2014-01-11 MED ORDER — METFORMIN HCL 500 MG PO TABS: 500.0000 mg | ORAL_TABLET | Freq: Two times a day (BID) | ORAL | Status: DC

## 2014-01-11 NOTE — Addendum Note (Signed)
Addended by: Susie CassetteABROL MD, Germain OsgoodNAYANA on: 01/11/2014 10:09 AM   Modules accepted: Orders

## 2014-01-11 NOTE — Telephone Encounter (Signed)
Tried to leave voicemail for pt, but voicemail box is not set up yet. Unable to reach pt.

## 2014-01-11 NOTE — Telephone Encounter (Signed)
Message copied by Oriyah Lamphear, [place] on Fri Jan 11, 2014  2:36 PM ------      Message from: Susie CassetteABROL MD, [facility]      Created: Fri Jan 11, 2014 10:10 AM       Notify patient of the patient glucose is elevated, so are his triglycerides, the patient is being started on metformin, [facility] ------

## 2014-01-16 ENCOUNTER — Ambulatory Visit: Payer: Medicare Other | Admitting: Cardiology

## 2014-01-22 ENCOUNTER — Other Ambulatory Visit: Payer: Self-pay | Admitting: Emergency Medicine

## 2014-01-22 ENCOUNTER — Telehealth: Payer: Self-pay | Admitting: Emergency Medicine

## 2014-01-22 MED ORDER — ALBUTEROL SULFATE HFA 108 (90 BASE) MCG/ACT IN AERS: 2.0000 | INHALATION_SPRAY | Freq: Four times a day (QID) | RESPIRATORY_TRACT | Status: DC | PRN

## 2014-01-22 NOTE — Telephone Encounter (Signed)
Per RB this is fine, advised pt had sent neb albuterol to [facility] and he can pick up Nothing further is needed

## 2014-01-23 ENCOUNTER — Telehealth: Payer: Self-pay | Admitting: Emergency Medicine

## 2014-01-23 MED ORDER — ALBUTEROL SULFATE (2.5 MG/3ML) 0.083% IN NEBU: INHALATION_SOLUTION | RESPIRATORY_TRACT | Status: DC

## 2014-01-23 NOTE — Telephone Encounter (Signed)
Advised pt would resend rx [facility] Nothing further is needed

## 2014-01-25 ENCOUNTER — Other Ambulatory Visit: Payer: Self-pay | Admitting: Emergency Medicine

## 2014-01-25 ENCOUNTER — Ambulatory Visit: Payer: Self-pay | Admitting: Internal Medicine

## 2014-02-08 ENCOUNTER — Ambulatory Visit (INDEPENDENT_AMBULATORY_CARE_PROVIDER_SITE_OTHER): Payer: Self-pay | Admitting: Internal Medicine

## 2014-02-08 ENCOUNTER — Encounter: Payer: Self-pay | Admitting: Internal Medicine

## 2014-02-08 VITALS — BP 140/80 | HR 81 | Ht 70.0 in | Wt 208.0 lb

## 2014-02-08 DIAGNOSIS — J441 Chronic obstructive pulmonary disease with (acute) exacerbation: Secondary | ICD-10-CM

## 2014-02-08 DIAGNOSIS — F17200 Nicotine dependence, unspecified, uncomplicated: Secondary | ICD-10-CM

## 2014-02-08 DIAGNOSIS — I2510 Atherosclerotic heart disease of native coronary artery without angina pectoris: Secondary | ICD-10-CM

## 2014-02-08 NOTE — Patient Instructions (Addendum)
It looks as if you are doing better since the hospital stays. Continue oxygen 2L/ [place].  You and Dr Delton CoombesByrum will look at need for Brovana and Brethine tabs when he sees you next.   Schedule to see him in about a month.  Please call as needed

## 2014-02-08 NOTE — Progress Notes (Signed)
Subjective:  Patient ID: [name], male    DOB: 12/05/1951, 63 y.o.   MRN: [id number] HPI 63 yo smoker, hx CAD/stents, COPD, admitted 01/2011 for acute exacerbation in setting suspected viral pneumonitis. Current regimen is albuterol/atrovent nebs 2 -3 x a day, budesonide neds bid. He believes that his breathing has declined some since his oral steroids. His breathing is worst with bending over, with exertion.   ROV 07/31/12 -- COPD, continued smoking, frequent exacerbations. Having more nocturnal wheeze compared with last time. No exacerbations since last time. He has been on DuoNebs and Pulmicort Nebs, but he ran out of pulmicort about a month ago - can tell that he misses it. 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   10/26/12 Acute OV  Complains of  wheezing, chest tightness, prod cough w/ yellow phlem, and increased SOB x 1 week. Patient was called in a prednisone taper. 3 days ago. Does feel the symptoms are starting to get better, but still has a congested cough with thick mucus  He denies any hemoptysis, orthopnea, PND, or leg swelling  ROV 12/27/12 -- Hx tobacco (continued smoking, down to 5-6 cig a week), COPD, CAD. Treated as above for AE in 11/13, then again by phone on 11/24/12. He returns today for regular follow up. Regimen is DuoNebs qid + Pulmicort bid. 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 He improved on prednisone, but now that it has finished he is experiencing chest soreness from dyspnea, cough in the am - yellowish phlegm. Exertional SOB is worsening.    ROV 02/22/13 -- severe COPD, continued tobacco, CAD. He was admitted 2/17-2/28 for AE-COPD and resp failure. Returns feeling improved. Still smoking 1/2 pk a week. On DuoNebs  Qid + pulmicort nebs bid.  He is trying the e-cig, hasn't stopped. He has exertional SOB, some wheeze. He has O2 that he uses qhs, with some exertion.    As an aside today he mentions that he has been having some longstanding pain in his L testicle, ? Worse when he was exacerbated. He says no anatomical abnormality, nodule, etc.   ROV 05/24/13 -- continued tobacco, severe COPD, CAD, hypoxemia. Follow up visit.  Has been on chronic pred. We discussed increase to 20mg , but not done yet, still on 10mg . He quit smoking for a period of time, about 1 month. Then had to go back to about 1 cig that he stretches out for the whole day puff by puff. He wants to try new version of e-cig to see if he can stop again. He looked into [employer], hasn't been started because the company said they couldn't ship without insurance.   ROV 06/28/13 -- continued tobacco, severe COPD, CAD, hypoxemia. Last time we increased pred to 20mg  daily. He is wearing o2 at 2-3 L/min w exertion. Unable to get brovana. Tried the nicotine gum briefly, didn't see much effect. He has cut cigs way down.   ROV 10/18/13 -- severe COPD, CAD, hypoxemia. Current pred is 20mg . Continues to smoke about 1/2 pack a week. He has had some episodes of acute chest tightness, on one occasion [facility] and was treated for AE-COPD. He also tells me that he had TIA since last visit for which he did not seek care. [facility] and was rx again for an AE. Taking duonebs qid +  budesonide bid.   ROV 11/28/13 -- severe COPD, CAD, hypoxemia. Current pred is 20mg . Continues to smoke. Last AE was 9/'14. For last 2 days has had some burning when he breaths in/out, helped a bit by BD's. Has minimal cough. Continues to smoke "a little".  Some wheezing. He has oxygen at home, occasionally uses.  With early flare. Still smoking.  P- continue current nebs - short burst increased pred - most important issue here is stopping smoking discussed in detail. We will revisit trying oral meds next visit.  - rov 2   02/08/14- 62 yoM former smoker (3ppd/ 153 pk yrs) followed by [name] for COPD, hypoxemia,  complicated by CAD FOLLOWS FOR:  Follow up hospital d/c -1/5--01/09/14- AECOPD. 3 separate admissions with rapid turnaround. He says he was sent home too fast by hospitalists. Still not back to normal.  Breathing and chest pain improved since d/c. He stopped smoking. He is back to maintenance 20 mg prednisone daily. He is not sure he can afford Brovana and can't tell that Brethine makes any difference. O2 2L/ [facility]. CXR 01/04/14 IMPRESSION:  Emphysema without acute disease.  Electronically Signed  By: [name] M.D.  On: 01/04/2014 14:08  ROS-see HPI Constitutional:   No-   weight loss, night sweats, fevers, chills, fatigue, lassitude. HEENT:   No-  headaches, difficulty swallowing, tooth/dental problems, sore throat,       No-  sneezing, itching, ear ache, nasal congestion, post nasal drip,  CV:  No-   chest pain, orthopnea, PND, swelling in lower extremities, anasarca,                                  dizziness, palpitations Resp: +shortness of breath with exertion or at rest.              No-   productive cough,  + non-productive cough,  No- coughing up of blood.              No-   change in color of mucus.  + wheezing.   Skin: No-   rash or lesions. GI:  No-   heartburn, indigestion, abdominal pain, nausea, vomiting,  GU:  MS:  No-   joint pain or swelling.  . Neuro-     nothing unusual Psych:  No- change in mood or affect. No depression or anxiety.  No memory loss.   Objective:  OBJ- Physical Exam General- Alert, Oriented, Affect-appropriate, Distress- none acute. Overweight. O2 2L Skin- rash-none, lesions- none, excoriation- none Lymphadenopathy- none Head- atraumatic            Eyes- Gross vision intact, PERRLA, conjunctivae and secretions clear            Ears- Hearing, canals-normal            Nose- Clear, no-Septal dev, mucus, polyps, erosion, perforation             Throat- Mallampati II , mucosa clear , drainage- none, tonsils- atrophic Neck- flexible , trachea  midline, no stridor , thyroid nl, carotid no bruit Chest - symmetrical excursion , unlabored           Heart/CV- RRR , no murmur , no gallop  , no rub, nl s1 s2                           - JVD- none , edema-  none, stasis changes- none, varices- none           Lung- +distant, wheeze+end expiratory, cough- none , dullness-none, rub- none           Chest wall-  Abd-  Br/ Gen/ Rectal- Not done, not indicated Extrem- cyanosis- none, clubbing, none, atrophy- none, strength- nl Neuro- grossly intact to observation   Assessment & Plan:

## 2014-02-08 NOTE — Assessment & Plan Note (Signed)
He says he has stopped smoking. Hopefully he can maintain this.

## 2014-02-08 NOTE — Assessment & Plan Note (Signed)
He is becoming more stable after recent hospital. Current therapy is appropriate Plan-continue maintenance prednisone, stepped of cigarettes, f/u w/ Dr Delton CoombesByrum

## 2014-02-27 ENCOUNTER — Encounter: Payer: Self-pay | Admitting: Emergency Medicine

## 2014-02-27 ENCOUNTER — Ambulatory Visit (INDEPENDENT_AMBULATORY_CARE_PROVIDER_SITE_OTHER): Payer: Self-pay | Admitting: Emergency Medicine

## 2014-02-27 VITALS — BP 122/74 | HR 82 | Ht 71.0 in | Wt 208.0 lb

## 2014-02-27 DIAGNOSIS — J449 Chronic obstructive pulmonary disease, unspecified: Secondary | ICD-10-CM

## 2014-02-27 DIAGNOSIS — I2510 Atherosclerotic heart disease of native coronary artery without angina pectoris: Secondary | ICD-10-CM

## 2014-02-27 MED ORDER — PREDNISONE 10 MG PO TABS
10.0000 mg | ORAL_TABLET | Freq: Every day | ORAL | Status: DC
Start: 2014-02-27 — End: 2014-04-01

## 2014-02-27 NOTE — Assessment & Plan Note (Signed)
-   will work on getting brovana bid, pulmicort bid - duonebs qid - oxygen - smoking cessation - increase pred to 30mg  qd - rov 1

## 2014-02-27 NOTE — Addendum Note (Signed)
Addended by: Gwynneth AlimentLAWRENCE, Aerie Donica A on: 02/27/2014 04:13 PM   Modules accepted: Orders

## 2014-02-27 NOTE — Progress Notes (Signed)
Subjective:  Patient ID: [name], male    DOB: 12/05/1951, 63 y.o.   MRN: [id number] HPI 63 yo smoker, hx CAD/stents, COPD, admitted 01/2011 for acute exacerbation in setting suspected viral pneumonitis. Current regimen is albuterol/atrovent nebs 2 -3 x a day, budesonide neds bid. He believes that his breathing has declined some since his oral steroids. His breathing is worst with bending over, with exertion.   ROV 07/31/12 -- COPD, continued smoking, frequent exacerbations. Having more nocturnal wheeze compared with last time. No exacerbations since last time. He has been on DuoNebs and Pulmicort Nebs, but he ran out of pulmicort about a month ago - can tell that he misses it. 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   10/26/12 Acute OV  Complains of  wheezing, chest tightness, prod cough w/ yellow phlem, and increased SOB x 1 week. Patient was called in a prednisone taper. 3 days ago. Does feel the symptoms are starting to get better, but still has a congested cough with thick mucus  He denies any hemoptysis, orthopnea, PND, or leg swelling  ROV 12/27/12 -- Hx tobacco (continued smoking, down to 5-6 cig a week), COPD, CAD. Treated as above for AE in 11/13, then again by phone on 11/24/12. He returns today for regular follow up. Regimen is DuoNebs qid + Pulmicort bid. 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 He improved on prednisone, but now that it has finished he is experiencing chest soreness from dyspnea, cough in the am - yellowish phlegm. Exertional SOB is worsening.    ROV 02/22/13 -- severe COPD, continued tobacco, CAD. He was admitted 2/17-2/28 for AE-COPD and resp failure. Returns feeling improved. Still smoking 1/2 pk a week. On DuoNebs  Qid + pulmicort nebs bid.  He is trying the e-cig, hasn't stopped. He has exertional SOB, some wheeze. He has O2 that he uses qhs, with some exertion.    As an aside today he mentions that he has been having some longstanding pain in his L testicle, ? Worse when he was exacerbated. He says no anatomical abnormality, nodule, etc.   ROV 05/24/13 -- continued tobacco, severe COPD, CAD, hypoxemia. Follow up visit.  Has been on chronic pred. We discussed increase to 20mg , but not done yet, still on 10mg . He quit smoking for a period of time, about 1 month. Then had to go back to about 1 cig that he stretches out for the whole day puff by puff. He wants to try new version of e-cig to see if he can stop again. He looked into [employer], hasn't been started because the company said they couldn't ship without insurance.   ROV 06/28/13 -- continued tobacco, severe COPD, CAD, hypoxemia. Last time we increased pred to 20mg  daily. He is wearing o2 at 2-3 L/min w exertion. Unable to get brovana. Tried the nicotine gum briefly, didn't see much effect. He has cut cigs way down.   ROV 10/18/13 -- severe COPD, CAD, hypoxemia. Current pred is 20mg . Continues to smoke about 1/2 pack a week. He has had some episodes of acute chest tightness, on one occasion had to [facility] and was treated for AE-COPD. He also tells me that he had TIA since last visit for which he did not seek care. [facility]13 and was rx again for an AE. Taking duonebs qid +  budesonide bid.   ROV 11/28/13 -- severe COPD, CAD, hypoxemia. Current pred is 20mg . Continues to smoke. Last AE was 9/'14. For last 2 days has had some burning when he breaths in/out, helped a bit by BD's. Has minimal cough. Continues to smoke "a little".  Some wheezing. He has oxygen at home, occasionally uses.  With early flare. Still smoking.  P- continue current nebs - short burst increased pred - most important issue here is stopping smoking discussed in detail. We will revisit trying oral meds next visit.  - rov 2  ROV 02/27/14 -- severe COPD, continued tobacco, hypoxemia and frequent exacerbations. He was  hospitalized 12/14 - 1/15 for the flu and flares and acute-on-chronic resp failure. He has been on pred 20mg  chronically - occasionally he take an extra 10mg  at bedtime. He notices a big improvement from this. He was sent out on brovana, pulmicort, duonebs prn. He ran out of brovana, has been using duonebs. He has terbutaline 2.5mg  that he has run out of. His breathing has been worse since he ran out of these meds.      Objective:  OBJ- Physical Exam Filed Vitals:   02/27/14 1527  BP: 122/74  Pulse: 82  Height: 5\' 11"  (1.803 m)  Weight: 208 lb (94.348 kg)  SpO2: 97%  Gen: Pleasant, in no distress, normal affect  ENT: No lesions, mouth clear, oropharynx clear, no postnasal drip  Neck: No JVD, no TMG, no carotid bruits  Lungs: Few rhonchi w/o use of accessory muscles, distant, no wheezing  Cardiovascular: RRR, heart sounds normal, no murmur or gallops, no peripheral edema  Musculoskeletal: No deformities, no cyanosis or clubbing  Neuro: alert, non focal  Skin: Warm, no lesions or rashes   Assessment & Plan:  COPD (chronic obstructive pulmonary disease) - will work on getting brovana bid, pulmicort bid - duonebs qid - oxygen - smoking cessation - increase pred to 30mg  qd - rov 1

## 2014-02-27 NOTE — Patient Instructions (Signed)
We will restart brovana and pulmicort twice a day  Start doing your albuterol/ipratropium 4 times a day Wear your oxygen at all times Work hard to stop smoking.  We will increase your prednisone to 30mg  daily.  Follow with Dr Delton CoombesByrum in 1 month

## 2014-03-11 ENCOUNTER — Ambulatory Visit: Payer: Medicare Other | Attending: Internal Medicine | Admitting: Internal Medicine

## 2014-03-11 ENCOUNTER — Encounter: Payer: Self-pay | Admitting: Internal Medicine

## 2014-03-11 VITALS — BP 114/71 | HR 86 | Temp 98.2°F | Resp 15

## 2014-03-11 DIAGNOSIS — I252 Old myocardial infarction: Secondary | ICD-10-CM | POA: Insufficient documentation

## 2014-03-11 DIAGNOSIS — K219 Gastro-esophageal reflux disease without esophagitis: Secondary | ICD-10-CM | POA: Insufficient documentation

## 2014-03-11 DIAGNOSIS — E785 Hyperlipidemia, unspecified: Secondary | ICD-10-CM | POA: Insufficient documentation

## 2014-03-11 DIAGNOSIS — Z79899 Other long term (current) drug therapy: Secondary | ICD-10-CM | POA: Insufficient documentation

## 2014-03-11 DIAGNOSIS — Z9981 Dependence on supplemental oxygen: Secondary | ICD-10-CM | POA: Insufficient documentation

## 2014-03-11 DIAGNOSIS — I10 Essential (primary) hypertension: Secondary | ICD-10-CM | POA: Insufficient documentation

## 2014-03-11 DIAGNOSIS — J449 Chronic obstructive pulmonary disease, unspecified: Secondary | ICD-10-CM

## 2014-03-11 DIAGNOSIS — IMO0002 Reserved for concepts with insufficient information to code with codable children: Secondary | ICD-10-CM | POA: Insufficient documentation

## 2014-03-11 DIAGNOSIS — I2510 Atherosclerotic heart disease of native coronary artery without angina pectoris: Secondary | ICD-10-CM | POA: Insufficient documentation

## 2014-03-11 DIAGNOSIS — E119 Type 2 diabetes mellitus without complications: Secondary | ICD-10-CM | POA: Insufficient documentation

## 2014-03-11 DIAGNOSIS — J4489 Other specified chronic obstructive pulmonary disease: Secondary | ICD-10-CM | POA: Insufficient documentation

## 2014-03-11 LAB — GLUCOSE, POCT (MANUAL RESULT ENTRY): POC Glucose: 129 mg/dl — AB (ref 70–99)

## 2014-03-11 NOTE — Progress Notes (Signed)
MRN: [id number] Name: [name]  Sex: male Age: 63 y.o. DOB: 09/21/1951  Allergies: Lipitor  Chief Complaint  Patient presents with  . Follow-up    HPI: Patient is 63 y.o. male who  history of diabetes, hypertension, CAD, COPD, patient has been following up with the pulmonologist, patient has lost follow up with her cardiologist, as per patient he had stents placed and is taking aspirin and Plavix, reported to have lower extremity edema denies any orthopnea or PND. Patient was recently seen by his pulmonologist and medications were optimized., He has diabetes which is well controlled   Past Medical History  Diagnosis Date  . COPD (chronic obstructive pulmonary disease)   . CAD (coronary artery disease)   . Hypertension   . Hyperlipidemia   . Osteomyelitis   . Myocardial infarction     "I've had a couple since 2006" (12/26/2013)  . Anginal pain   . Asthma   . Pneumonia     "have had it several times; maybe now too": (12/26/2013)  . Shortness of breath     "all the time" (12/26/2013)  . On home oxygen therapy     "2-3L; 24/7" (12/26/2013)  . GERD (gastroesophageal reflux disease)   . Daily headache   . Stroke     "they say I've had a couple" denies residual on 12/26/2013  . Arthritis     "hands; feet" (12/26/2013)  . Osteomyelitis hip     "right"  . Chronic lower back pain     Past Surgical History  Procedure Laterality Date  . Appendectomy  1963  . Cardiac catheterization  ? 2009; ?2012  . Coronary angioplasty with stent placement  2006; ?2008; ?2010    "2 + 2 + 1"      Medication List       This list is accurate as of: 03/11/14  4:14 PM.  Always use your most recent med list.               albuterol 108 (90 BASE) MCG/ACT inhaler  Commonly known as:  PROVENTIL HFA;VENTOLIN HFA  Inhale 2 puffs into the lungs every 6 (six) hours as needed for wheezing or shortness of breath.     albuterol (2.5 MG/3ML) 0.083% nebulizer solution  Commonly known as:  PROVENTIL    INHALE CONTENTS OF ONE VIAL IN NEBULIZER 4 TIMES DAILY     arformoterol 15 MCG/2ML Nebu  Commonly known as:  BROVANA  Take 2 mLs (15 mcg total) by nebulization 2 (two) times daily.     aspirin 325 MG tablet  Take 650 mg by mouth every morning.     budesonide 0.25 MG/2ML nebulizer solution  Commonly known as:  PULMICORT  Take 0.25 mg by nebulization 2 (two) times daily.     clopidogrel 75 MG tablet  Commonly known as:  PLAVIX  Take 1 tablet (75 mg total) by mouth daily.     fluticasone 50 MCG/ACT nasal spray  Commonly known as:  FLONASE  Place 2 sprays into both nostrils 2 (two) times daily.     furosemide 20 MG tablet  Commonly known as:  LASIX  Take 1 tablet (20 mg total) by mouth every morning.     metFORMIN 500 MG tablet  Commonly known as:  GLUCOPHAGE  Take 1 tablet (500 mg total) by mouth 2 (two) times daily with a meal.     metoprolol 50 MG tablet  Commonly known as:  LOPRESSOR  Take 1 tablet (  50 mg total) by mouth 2 (two) times daily.     nystatin 100000 UNIT/ML suspension  Commonly known as:  MYCOSTATIN  Take 5 mLs (500,000 Units total) by mouth 4 (four) times daily.     oxyCODONE-acetaminophen 5-325 MG per tablet  Commonly known as:  PERCOCET/ROXICET  Take 1 tablet by mouth every 4 (four) hours as needed for severe pain.     pantoprazole 40 MG tablet  Commonly known as:  PROTONIX  Take 1 tablet (40 mg total) by mouth daily at 12 noon.     predniSONE 10 MG tablet  Commonly known as:  DELTASONE  Take 1 tablet (10 mg total) by mouth daily with breakfast. 2 tabs daily     terbutaline 2.5 MG tablet  Commonly known as:  BRETHINE  Take 1 tablet (2.5 mg total) by mouth every 6 (six) hours.     traZODone 50 MG tablet  Commonly known as:  DESYREL  Take 1 tablet (50 mg total) by mouth at bedtime as needed for sleep.        No orders of the defined types were placed in this encounter.    Immunization History  Administered Date(s) Administered  .  Influenza Split 10/19/2011, 02/07/2013  . Influenza Whole 11/19/2010  . Influenza,inj,Quad PF,36+ Mos 12/17/2013  . Pneumococcal Polysaccharide-23 07/20/2010    Family History  Problem Relation Age of Onset  . Coronary artery disease    . Hypertension      History  Substance Use Topics  . Smoking status: Former Smoker -- 3.00 packs/day for 51 years    Types: Cigarettes    Quit date: 11/19/2013  . Smokeless tobacco: Never Used  . Alcohol Use: No    Review of Systems   As noted in HPI  Filed Vitals:   03/11/14 1529  BP: 114/71  Pulse: 86  Temp: 98.2 F (36.8 C)  Resp: 15    Physical Exam  Physical Exam  Eyes: EOM are normal. Pupils are equal, round, and reactive to light.  Cardiovascular: Normal rate and regular rhythm.   Pulmonary/Chest: No respiratory distress. He has no wheezes.  Minimal wheezing   Musculoskeletal: He exhibits edema.    CBC    Component Value Date/Time   WBC 10.0 01/10/2014 1215   RBC 4.48 01/10/2014 1215   HGB 13.2 01/10/2014 1215   HCT 39.6 01/10/2014 1215   PLT 155 01/10/2014 1215   MCV 88.4 01/10/2014 1215   LYMPHSABS 1.1 01/10/2014 1215   MONOABS 0.4 01/10/2014 1215   EOSABS 0.0 01/10/2014 1215   BASOSABS 0.0 01/10/2014 1215    CMP     Component Value Date/Time   NA 140 01/10/2014 1215   K 3.9 01/10/2014 1215   CL 95* 01/10/2014 1215   CO2 38* 01/10/2014 1215   GLUCOSE 221* 01/10/2014 1215   BUN 17 01/10/2014 1215   CREATININE 0.85 01/10/2014 1215   CREATININE 0.74 01/05/2014 0845   CALCIUM 8.5 01/10/2014 1215   PROT 5.5* 01/10/2014 1215   ALBUMIN 3.5 01/10/2014 1215   AST 18 01/10/2014 1215   ALT 40 01/10/2014 1215   ALKPHOS 52 01/10/2014 1215   BILITOT 0.4 01/10/2014 1215   GFRNONAA >90 01/05/2014 0845   GFRAA >90 01/05/2014 0845    Lab Results  Component Value Date/Time   CHOL 208* 01/10/2014 12:15 PM    No components found with this basename: hga1c    Lab Results  Component Value Date/Time   AST 18 01/10/2014 12:15 PM  Assessment and Plan  DM (diabetes mellitus) - Plan: Glucose (CBG) Results for orders placed in visit on 03/11/14  GLUCOSE, POCT (MANUAL RESULT ENTRY)      Result Value Ref Range   POC Glucose 129 (*) 70 - 99 mg/dl    continue with metformin, keep the fingerstick log.   COPD (chronic obstructive pulmonary disease)  Following up with pulmonology on continuous home oxygen   CAD (coronary artery disease) Patient is referred to cardiology.    Return in about 3 months (around 06/11/2014) for diabetes.  Doris CheadleADVANI, Jayliani Wanner, MD

## 2014-03-11 NOTE — Progress Notes (Signed)
Patient here for follow up- copd

## 2014-03-22 ENCOUNTER — Other Ambulatory Visit: Payer: Self-pay | Admitting: Emergency Medicine

## 2014-03-28 ENCOUNTER — Other Ambulatory Visit: Payer: Self-pay

## 2014-04-01 ENCOUNTER — Ambulatory Visit (INDEPENDENT_AMBULATORY_CARE_PROVIDER_SITE_OTHER): Payer: Self-pay | Admitting: Emergency Medicine

## 2014-04-01 ENCOUNTER — Encounter: Payer: Self-pay | Admitting: Emergency Medicine

## 2014-04-01 VITALS — BP 128/84 | HR 95 | Ht 71.0 in | Wt 210.0 lb

## 2014-04-01 DIAGNOSIS — J449 Chronic obstructive pulmonary disease, unspecified: Secondary | ICD-10-CM

## 2014-04-01 MED ORDER — PREDNISONE 10 MG PO TABS: ORAL_TABLET | ORAL | Status: DC

## 2014-04-01 NOTE — Assessment & Plan Note (Signed)
We will continue your prednisone at 30mg daily.  Please continue oxygen  Continue your albuterol / ipratropium nebulizers 4 x a day Continue your pulmicort twice a day Continue to work on stopping smoking Follow with Dr Harper Vandervoort in 2 months or sooner if you have any problems.  

## 2014-04-01 NOTE — Patient Instructions (Addendum)
We will continue your prednisone at 30mg  daily.  Please continue oxygen  Continue your albuterol / ipratropium nebulizers 4 x a day Continue your pulmicort twice a day Continue to work on stopping smoking Follow with Dr Delton CoombesByrum in 2 months or sooner if you have any problems.

## 2014-04-01 NOTE — Progress Notes (Signed)
Subjective:  Patient ID: [name], male    DOB: 12/05/1951, 63 y.o.   MRN: [id number] HPI 63 yo smoker, hx CAD/stents, COPD, admitted 01/2011 for acute exacerbation in setting suspected viral pneumonitis. Current regimen is albuterol/atrovent nebs 2 -3 x a day, budesonide neds bid. He believes that his breathing has declined some since his oral steroids. His breathing is worst with bending over, with exertion.   ROV 07/31/12 -- COPD, continued smoking, frequent exacerbations. Having more nocturnal wheeze compared with last time. No exacerbations since last time. He has been on DuoNebs and Pulmicort Nebs, but he ran out of pulmicort about a month ago - can tell that he misses it. 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   10/26/12 Acute OV  Complains of  wheezing, chest tightness, prod cough w/ yellow phlem, and increased SOB x 1 week. Patient was called in a prednisone taper. 3 days ago. Does feel the symptoms are starting to get better, but still has a congested cough with thick mucus  He denies any hemoptysis, orthopnea, PND, or leg swelling  ROV 12/27/12 -- Hx tobacco (continued smoking, down to 5-6 cig a week), COPD, CAD. Treated as above for AE in 11/13, then again by phone on 11/24/12. He returns today for regular follow up. Regimen is DuoNebs qid + Pulmicort bid. 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 He improved on prednisone, but now that it has finished he is experiencing chest soreness from dyspnea, cough in the am - yellowish phlegm. Exertional SOB is worsening.    ROV 02/22/13 -- severe COPD, continued tobacco, CAD. He was admitted 2/17-2/28 for AE-COPD and resp failure. Returns feeling improved. Still smoking 1/2 pk a week. On DuoNebs  Qid + pulmicort nebs bid.  He is trying the e-cig, hasn't stopped. He has exertional SOB, some wheeze. He has O2 that he uses qhs, with some exertion.    As an aside today he mentions that he has been having some longstanding pain in his L testicle, ? Worse when he was exacerbated. He says no anatomical abnormality, nodule, etc.   ROV 05/24/13 -- continued tobacco, severe COPD, CAD, hypoxemia. Follow up visit.  Has been on chronic pred. We discussed increase to 20mg , but not done yet, still on 10mg . He quit smoking for a period of time, about 1 month. Then had to go back to about 1 cig that he stretches out for the whole day puff by puff. He wants to try new version of e-cig to see if he can stop again. He looked into [employer], hasn't been started because the company said they couldn't ship without insurance.   ROV 06/28/13 -- continued tobacco, severe COPD, CAD, hypoxemia. Last time we increased pred to 20mg  daily. He is wearing o2 at 2-3 L/min w exertion. Unable to get brovana. Tried the nicotine gum briefly, didn't see much effect. He has cut cigs way down.   ROV 10/18/13 -- severe COPD, CAD, hypoxemia. Current pred is 20mg . Continues to smoke about 1/2 pack a week. He has had some episodes of acute chest tightness, on one occasion [facility] and was treated for AE-COPD. He also tells me that he had TIA since last visit for which he did not seek care. [facility]13 and was rx again for an AE. Taking duonebs qid +  budesonide bid.   ROV 11/28/13 -- severe COPD, CAD, hypoxemia. Current pred is 20mg . Continues to smoke. Last AE was 9/'14. For last 2 days has had some burning when he breaths in/out, helped a bit by BD's. Has minimal cough. Continues to smoke "a little".  Some wheezing. He has oxygen at home, occasionally uses.  With early flare. Still smoking.  P- continue current nebs - short burst increased pred - most important issue here is stopping smoking discussed in detail. We will revisit trying oral meds next visit.  - rov 2  ROV 02/27/14 -- severe COPD, continued tobacco, hypoxemia and frequent exacerbations. He was  hospitalized 12/14 - 1/15 for the flu and flares and acute-on-chronic resp failure. He has been on pred 20mg  chronically - occasionally he take an extra 10mg  at bedtime. He notices a big improvement from this. He was sent out on brovana, pulmicort, duonebs prn. He ran out of brovana, has been using duonebs. He has terbutaline 2.5mg  that he has run out of. His breathing has been worse since he ran out of these meds.   ROV 04/01/14 -- smoker with COPD, hypoxemia, frequent exacerbations. He has cut the cigarettes down to sometimes none, sometimes 2-3 a day. On chronic pred. We attempted to get brovana / pulmicort, but his insurance will not allow. We increased his pred to 30mg  last time.  He is on duonebs qid + pulmicort bid. Not using nasal steroid on a schedule, just prn.     Objective:  OBJ- Physical Exam Filed Vitals:   04/01/14 1644  BP: 128/84  Pulse: 95  Height: 5\' 11"  (1.803 m)  Weight: 210 lb (95.255 kg)  SpO2: 94%  Gen: Pleasant, in no distress, normal affect  ENT: No lesions, mouth clear, oropharynx clear, no postnasal drip  Neck: No JVD, no TMG, no carotid bruits  Lungs: Few rhonchi w/o use of accessory muscles, distant, no wheezing  Cardiovascular: RRR, heart sounds normal, no murmur or gallops, no peripheral edema  Musculoskeletal: No deformities, no cyanosis or clubbing  Neuro: alert, non focal  Skin: Warm, no lesions or rashes   Assessment & Plan:  COPD (chronic obstructive pulmonary disease) We will continue your prednisone at 30mg  daily.  Please continue oxygen  Continue your albuterol / ipratropium nebulizers 4 x a day Continue your pulmicort twice a day Continue to work on stopping smoking Follow with Dr Delton CoombesByrum in 2 months or sooner if you have any problems

## 2014-05-25 ENCOUNTER — Other Ambulatory Visit: Payer: Self-pay | Admitting: Emergency Medicine

## 2014-06-03 ENCOUNTER — Ambulatory Visit (INDEPENDENT_AMBULATORY_CARE_PROVIDER_SITE_OTHER): Payer: Self-pay | Admitting: Emergency Medicine

## 2014-06-03 ENCOUNTER — Encounter: Payer: Self-pay | Admitting: Emergency Medicine

## 2014-06-03 VITALS — BP 130/82 | HR 81 | Ht 71.0 in | Wt 210.0 lb

## 2014-06-03 DIAGNOSIS — I2510 Atherosclerotic heart disease of native coronary artery without angina pectoris: Secondary | ICD-10-CM

## 2014-06-03 DIAGNOSIS — J449 Chronic obstructive pulmonary disease, unspecified: Secondary | ICD-10-CM

## 2014-06-03 DIAGNOSIS — F17200 Nicotine dependence, unspecified, uncomplicated: Secondary | ICD-10-CM

## 2014-06-03 MED ORDER — ALBUTEROL SULFATE (2.5 MG/3ML) 0.083% IN NEBU: 2.5000 mg | INHALATION_SOLUTION | Freq: Four times a day (QID) | RESPIRATORY_TRACT | Status: DC | PRN

## 2014-06-03 NOTE — Patient Instructions (Signed)
Please continue your albuterol / ipratropium nebs 4 times a day.  We will restart your pulmicort nebs twice a day when you get the paper work completed.  Continue your prednisone 30mg daily for now. We will revisit decreasing this at your next appointment  Follow with Dr Lakesha Levinson in 3 months or sooner if you have any problems 

## 2014-06-03 NOTE — Assessment & Plan Note (Signed)
Discussed cessation 

## 2014-06-03 NOTE — Progress Notes (Signed)
Subjective:  Patient ID: [name], male    DOB: 12/05/1951, 63 y.o.   MRN: [id number] HPI 63 yo smoker, hx CAD/stents, COPD, admitted 01/2011 for acute exacerbation in setting suspected viral pneumonitis. Current regimen is albuterol/atrovent nebs 2 -3 x a day, budesonide neds bid. He believes that his breathing has declined some since his oral steroids. His breathing is worst with bending over, with exertion.   ROV 07/31/12 -- COPD, continued smoking, frequent exacerbations. Having more nocturnal wheeze compared with last time. No exacerbations since last time. He has been on DuoNebs and Pulmicort Nebs, but he ran out of pulmicort about a month ago - can tell that he misses it. 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   10/26/12 Acute OV  Complains of  wheezing, chest tightness, prod cough w/ yellow phlem, and increased SOB x 1 week. Patient was called in a prednisone taper. 3 days ago. Does feel the symptoms are starting to get better, but still has a congested cough with thick mucus  He denies any hemoptysis, orthopnea, PND, or leg swelling  ROV 12/27/12 -- Hx tobacco (continued smoking, down to 5-6 cig a week), COPD, CAD. Treated as above for AE in 11/13, then again by phone on 11/24/12. He returns today for regular follow up. Regimen is DuoNebs qid + Pulmicort bid. 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 He improved on prednisone, but now that it has finished he is experiencing chest soreness from dyspnea, cough in the am - yellowish phlegm. Exertional SOB is worsening.    ROV 02/22/13 -- severe COPD, continued tobacco, CAD. He was admitted 2/17-2/28 for AE-COPD and resp failure. Returns feeling improved. Still smoking 1/2 pk a week. On DuoNebs  Qid + pulmicort nebs bid.  He is trying the e-cig, hasn't stopped. He has exertional SOB, some wheeze. He has O2 that he uses qhs, with some exertion.    As an aside today he mentions that he has been having some longstanding pain in his L testicle, ? Worse when he was exacerbated. He says no anatomical abnormality, nodule, etc.   ROV 05/24/13 -- continued tobacco, severe COPD, CAD, hypoxemia. Follow up visit.  Has been on chronic pred. We discussed increase to 20mg , but not done yet, still on 10mg . He quit smoking for a period of time, about 1 month. Then had to go back to about 1 cig that he stretches out for the whole day puff by puff. He wants to try new version of e-cig to see if he can stop again. He looked into [employer], hasn't been started because the company said they couldn't ship without insurance.   ROV 06/28/13 -- continued tobacco, severe COPD, CAD, hypoxemia. Last time we increased pred to 20mg  daily. He is wearing o2 at 2-3 L/min w exertion. Unable to get brovana. Tried the nicotine gum briefly, didn't see much effect. He has cut cigs way down.   ROV 10/18/13 -- severe COPD, CAD, hypoxemia. Current pred is 20mg . Continues to smoke about 1/2 pack a week. He has had some episodes of acute chest tightness, on one occasion had to [facility] and was treated for AE-COPD. He also tells me that he had TIA since last visit for which he did not seek care. He went [facility] 08[facility]25/13 and was rx again for an AE. Taking duonebs qid +  budesonide bid.   ROV 11/28/13 -- severe COPD, CAD, hypoxemia. Current pred is 20mg . Continues to smoke. Last AE was 9/'14. For last 2 days has had some burning when he breaths in/out, helped a bit by BD's. Has minimal cough. Continues to smoke "a little".  Some wheezing. He has oxygen at home, occasionally uses.  With early flare. Still smoking.  P- continue current nebs - short burst increased pred - most important issue here is stopping smoking discussed in detail. We will revisit trying oral meds next visit.  - rov 2  ROV 02/27/14 -- severe COPD, continued tobacco, hypoxemia and frequent exacerbations. He was  hospitalized 12/14 - 1/15 for the flu and flares and acute-on-chronic resp failure. He has been on pred 20mg  chronically - occasionally he take an extra 10mg  at bedtime. He notices a big improvement from this. He was sent out on brovana, pulmicort, duonebs prn. He ran out of brovana, has been using duonebs. He has terbutaline 2.5mg  that he has run out of. His breathing has been worse since he ran out of these meds.   ROV 04/01/14 -- smoker with COPD, hypoxemia, frequent exacerbations. He has cut the cigarettes down to sometimes none, sometimes 2-3 a day. On chronic pred. We attempted to get brovana / pulmicort, but his insurance will not allow. We increased his pred to 30mg  last time.  He is on duonebs qid + pulmicort bid. Not using nasal steroid on a schedule, just prn.   ROV 06/03/14 -- smoker with COPD, hypoxemia, frequent exacerbations. He has been on duonebs qid + budesonide bid. Currently off budesonide while trying to get payment assistance sorted out. Also on chronic pred 30mg . He was having trouble sleeping on terbutaline, so he stopped it > better. Not taking nasal steroid.   Objective:  OBJ- Physical Exam Filed Vitals:   06/03/14 1624  BP: 130/82  Pulse: 81  Height: 5\' 11"  (1.803 m)  Weight: 210 lb (95.255 kg)  SpO2: 93%  Gen: Pleasant, in no distress, normal affect  ENT: No lesions, mouth clear, oropharynx clear, no postnasal drip  Neck: No JVD, no TMG, no carotid bruits  Lungs: Few rhonchi w/o use of accessory muscles, distant, no wheezing  Cardiovascular: RRR, heart sounds normal, no murmur or gallops, no peripheral edema  Musculoskeletal: No deformities, no cyanosis or clubbing  Neuro: alert, non focal  Skin: Warm, no lesions or rashes   Assessment & Plan:  COPD (chronic obstructive pulmonary disease) Please continue your albuterol / ipratropium nebs 4 times a day.  We will restart your pulmicort nebs twice a day when you get the paper work completed.  Continue your  prednisone 30mg  daily for now. We will revisit decreasing this at your next appointment  Follow with Dr Delton CoombesByrum in 3 months or sooner if you have any problems  TOBACCO USER Discussed cessation

## 2014-06-03 NOTE — Assessment & Plan Note (Signed)
Please continue your albuterol / ipratropium nebs 4 times a day.  We will restart your pulmicort nebs twice a day when you get the paper work completed.  Continue your prednisone 30mg  daily for now. We will revisit decreasing this at your next appointment  Follow with Dr Delton CoombesByrum in 3 months or sooner if you have any problems

## 2014-06-24 ENCOUNTER — Other Ambulatory Visit: Payer: Self-pay | Admitting: Emergency Medicine

## 2014-07-08 ENCOUNTER — Ambulatory Visit: Payer: Medicare Other | Attending: Internal Medicine | Admitting: Internal Medicine

## 2014-07-08 ENCOUNTER — Encounter: Payer: Self-pay | Admitting: Internal Medicine

## 2014-07-08 VITALS — BP 111/76 | HR 72 | Temp 98.3°F | Resp 14 | Ht 71.0 in | Wt 211.0 lb

## 2014-07-08 DIAGNOSIS — M545 Low back pain, unspecified: Secondary | ICD-10-CM | POA: Insufficient documentation

## 2014-07-08 DIAGNOSIS — Z8673 Personal history of transient ischemic attack (TIA), and cerebral infarction without residual deficits: Secondary | ICD-10-CM | POA: Insufficient documentation

## 2014-07-08 DIAGNOSIS — E785 Hyperlipidemia, unspecified: Secondary | ICD-10-CM | POA: Insufficient documentation

## 2014-07-08 DIAGNOSIS — G8929 Other chronic pain: Secondary | ICD-10-CM | POA: Insufficient documentation

## 2014-07-08 DIAGNOSIS — K219 Gastro-esophageal reflux disease without esophagitis: Secondary | ICD-10-CM | POA: Insufficient documentation

## 2014-07-08 DIAGNOSIS — I252 Old myocardial infarction: Secondary | ICD-10-CM | POA: Insufficient documentation

## 2014-07-08 DIAGNOSIS — I2510 Atherosclerotic heart disease of native coronary artery without angina pectoris: Secondary | ICD-10-CM | POA: Insufficient documentation

## 2014-07-08 DIAGNOSIS — J4489 Other specified chronic obstructive pulmonary disease: Secondary | ICD-10-CM | POA: Insufficient documentation

## 2014-07-08 DIAGNOSIS — R51 Headache: Secondary | ICD-10-CM | POA: Insufficient documentation

## 2014-07-08 DIAGNOSIS — E119 Type 2 diabetes mellitus without complications: Secondary | ICD-10-CM

## 2014-07-08 DIAGNOSIS — Z79899 Other long term (current) drug therapy: Secondary | ICD-10-CM | POA: Insufficient documentation

## 2014-07-08 DIAGNOSIS — J449 Chronic obstructive pulmonary disease, unspecified: Secondary | ICD-10-CM | POA: Insufficient documentation

## 2014-07-08 DIAGNOSIS — I10 Essential (primary) hypertension: Secondary | ICD-10-CM

## 2014-07-08 DIAGNOSIS — Z87891 Personal history of nicotine dependence: Secondary | ICD-10-CM | POA: Insufficient documentation

## 2014-07-08 LAB — GLUCOSE, POCT (MANUAL RESULT ENTRY): POC GLUCOSE: 110 mg/dL — AB (ref 70–99)

## 2014-07-08 LAB — POCT GLYCOSYLATED HEMOGLOBIN (HGB A1C): HEMOGLOBIN A1C: 6.5

## 2014-07-08 MED ORDER — TRAMADOL HCL 50 MG PO TABS: 50.0000 mg | ORAL_TABLET | Freq: Three times a day (TID) | ORAL | Status: DC | PRN

## 2014-07-08 MED ORDER — ACETAMINOPHEN-CODEINE #3 300-30 MG PO TABS: 1.0000 | ORAL_TABLET | ORAL | Status: DC | PRN

## 2014-07-08 NOTE — Patient Instructions (Signed)
Hypertension Hypertension, commonly called high blood pressure, is when the force of blood pumping through your arteries is too strong. Your arteries are the blood vessels that carry blood from your heart throughout your body. A blood pressure reading consists of a higher number over a lower number, such as 110/72. The higher number (systolic) is the pressure inside your arteries when your heart pumps. The lower number (diastolic) is the pressure inside your arteries when your heart relaxes. Ideally you want your blood pressure below 120/80. Hypertension forces your heart to work harder to pump blood. Your arteries may become narrow or stiff. Having hypertension puts you at risk for heart disease, stroke, and other problems.  RISK FACTORS Some risk factors for high blood pressure are controllable. Others are not.  Risk factors you cannot control include:   Race. You may be at higher risk if you are African American.  Age. Risk increases with age.  Gender. Men are at higher risk than women before age 45 years. After age 65, women are at higher risk than men. Risk factors you can control include:  Not getting enough exercise or physical activity.  Being overweight.  Getting too much fat, sugar, calories, or salt in your diet.  Drinking too much alcohol. SIGNS AND SYMPTOMS Hypertension does not usually cause signs or symptoms. Extremely high blood pressure (hypertensive crisis) may cause headache, anxiety, shortness of breath, and nosebleed. DIAGNOSIS  To check if you have hypertension, your health care provider will measure your blood pressure while you are seated, with your arm held at the level of your heart. It should be measured at least twice using the same arm. Certain conditions can cause a difference in blood pressure between your right and left arms. A blood pressure reading that is higher than normal on one occasion does not mean that you need treatment. If one blood pressure reading  is high, ask your health care provider about having it checked again. TREATMENT  Treating high blood pressure includes making lifestyle changes and possibly taking medication. Living a healthy lifestyle can help lower high blood pressure. You may need to change some of your habits. Lifestyle changes may include:  Following the DASH diet. This diet is high in fruits, vegetables, and whole grains. It is low in salt, red meat, and added sugars.  Getting at least 2 1/2 hours of brisk physical activity every week.  Losing weight if necessary.  Not smoking.  Limiting alcoholic beverages.  Learning ways to reduce stress. If lifestyle changes are not enough to get your blood pressure under control, your health care provider may prescribe medicine. You may need to take more than one. Work closely with your health care provider to understand the risks and benefits. HOME CARE INSTRUCTIONS  Have your blood pressure rechecked as directed by your health care provider.   Only take medicine as directed by your health care provider. Follow the directions carefully. Blood pressure medicines must be taken as prescribed. The medicine does not work as well when you skip doses. Skipping doses also puts you at risk for problems.   Do not smoke.   Monitor your blood pressure at home as directed by your health care provider. SEEK MEDICAL CARE IF:   You think you are having a reaction to medicines taken.  You have recurrent headaches or feel dizzy.  You have swelling in your ankles.  You have trouble with your vision. SEEK IMMEDIATE MEDICAL CARE IF:  You develop a severe headache or   confusion.  You have unusual weakness, numbness, or feel faint.  You have severe chest or abdominal pain.  You vomit repeatedly.  You have trouble breathing. MAKE SURE YOU:   Understand these instructions.  Will watch your condition.  Will get help right away if you are not doing well or get  worse. Document Released: 12/06/2005 Document Revised: 12/11/2013 Document Reviewed: 09/28/2013 Exit[employer] Patient Information 2015 [employer]. This information is not intended to replace advice given to you by your health care provider. Make sure you discuss any questions you have with your health care provider.  

## 2014-07-08 NOTE — Progress Notes (Signed)
Pt is here having pain in his right rib cage and lower back. Pt states that his legs are very weak.

## 2014-07-08 NOTE — Progress Notes (Addendum)
Patient ID: [name], male   DOB: 04/29/1951, 63 y.o.   MRN: [id number]  CC: follow up  HPI: 63 year-old male with past medical history of hypertension, diabetes, COPD on oxygen who presented to clinic for followup. Patient follows with pulmonary. He has no shortness of breath at this time. No chest pain. No fevers or chills. He is compliant with his medications.  Allergies  Allergen Reactions  . Lipitor [Atorvastatin Calcium] Other (See Comments)    Muscle aches   Past Medical History  Diagnosis Date  . COPD (chronic obstructive pulmonary disease)   . CAD (coronary artery disease)   . Hypertension   . Hyperlipidemia   . Osteomyelitis   . Myocardial infarction     "I've had a couple since 2006" (12/26/2013)  . Anginal pain   . Asthma   . Pneumonia     "have had it several times; maybe now too": (12/26/2013)  . Shortness of breath     "all the time" (12/26/2013)  . On home oxygen therapy     "2-3L; 24/7" (12/26/2013)  . GERD (gastroesophageal reflux disease)   . Daily headache   . Stroke     "they say I've had a couple" denies residual on 12/26/2013  . Arthritis     "hands; feet" (12/26/2013)  . Osteomyelitis hip     "right"  . Chronic lower back pain    Current Outpatient Prescriptions on File Prior to Visit  Medication Sig Dispense Refill  . albuterol (PROVENTIL HFA;VENTOLIN HFA) 108 (90 BASE) MCG/ACT inhaler Inhale 2 puffs into the lungs every 6 (six) hours as needed for wheezing or shortness of breath.  1 Inhaler  11  . albuterol (PROVENTIL) (2.5 MG/3ML) 0.083% nebulizer solution INHALE CONTENTS OF ONE VIAL IN NEBULIZER 4 TIMES DAILY  150 mL  9  . aspirin 325 MG tablet Take 650 mg by mouth every morning.       . budesonide (PULMICORT) 0.25 MG/2ML nebulizer solution Take 0.25 mg by nebulization 2 (two) times daily.      . fluticasone (FLONASE) 50 MCG/ACT nasal spray Place 2 sprays into both nostrils 2 (two) times daily.  16 g  2  . furosemide (LASIX) 20 MG tablet Take 1 tablet  (20 mg total) by mouth every morning.  30 tablet  0  . metFORMIN (GLUCOPHAGE) 500 MG tablet Take 1 tablet (500 mg total) by mouth 2 (two) times daily with a meal.  180 tablet  3  . metoprolol (LOPRESSOR) 50 MG tablet TAKE 1 TABLET (50 MG TOTAL) BY MOUTH 2 (TWO) TIMES DAILY.  60 tablet  5  . nystatin (MYCOSTATIN) 100000 UNIT/ML suspension Take 5 mLs (500,000 Units total) by mouth 4 (four) times daily.  60 mL  0  . pantoprazole (PROTONIX) 40 MG tablet Take 1 tablet (40 mg total) by mouth daily at 12 noon.  30 tablet  0  . predniSONE (DELTASONE) 10 MG tablet Take 10 mg by mouth daily with breakfast. 30 mg daily      . traZODone (DESYREL) 50 MG tablet Take 1 tablet (50 mg total) by mouth at bedtime as needed for sleep.  20 tablet  0  . arformoterol (BROVANA) 15 MCG/2ML NEBU Take 2 mLs (15 mcg total) by nebulization 2 (two) times daily.  120 mL  0  . clopidogrel (PLAVIX) 75 MG tablet Take 1 tablet (75 mg total) by mouth daily.  31 tablet  0  . terbutaline (BRETHINE) 2.5 MG tablet Take 1  tablet (2.5 mg total) by mouth every 6 (six) hours.  120 tablet  1   No current facility-administered medications on file prior to visit.   Family History  Problem Relation Age of Onset  . Coronary artery disease    . Hypertension     History   Social History  . Marital Status: Married    Spouse Name: N/A    Number of Children: N/A  . Years of Education: N/A   Occupational History  . Disabled    Social History Main Topics  . Smoking status: Former Smoker -- 3.00 packs/day for 51 years    Types: Cigarettes    Quit date: 11/19/2013  . Smokeless tobacco: Never Used  . Alcohol Use: No  . Drug Use: No  . Sexual Activity: Not Currently   Other Topics Concern  . Not on file   Social History Narrative  . No narrative on file    Review of Systems  Constitutional: Negative for fever, chills, diaphoresis, activity change, appetite change and fatigue.  HENT: Negative for ear pain, nosebleeds, congestion,  facial swelling, rhinorrhea, neck pain, neck stiffness and ear discharge.   Eyes: Negative for pain, discharge, redness, itching and visual disturbance.  Respiratory: Negative for cough, choking, chest tightness, shortness of breath, wheezing and stridor.   Cardiovascular: Negative for chest pain, palpitations and leg swelling.  Gastrointestinal: Negative for abdominal distention.  Genitourinary: Negative for dysuria, urgency, frequency, hematuria, flank pain, decreased urine volume, difficulty urinating and dyspareunia.  Musculoskeletal: Negative for back pain, joint swelling, arthralgias and gait problem.  Neurological: Negative for dizziness, tremors, seizures, syncope, facial asymmetry, speech difficulty, weakness, light-headedness, numbness and headaches.  Hematological: Negative for adenopathy. Does not bruise/bleed easily.  Psychiatric/Behavioral: Negative for hallucinations, behavioral problems, confusion, dysphoric mood, decreased concentration and agitation.    Objective:  There were no vitals filed for this visit.  Physical Exam  Constitutional: Appears well-developed and well-nourished. No distress.  HENT: Normocephalic. External right and left ear normal. Oropharynx is clear and moist.  Eyes: Conjunctivae and EOM are normal. PERRLA, no scleral icterus.  Neck: Normal ROM. Neck supple. No JVD. No tracheal deviation. No thyromegaly.  CVS: RRR, S1/S2 +, no murmurs, no gallops, no carotid bruit.  Pulmonary: Effort and breath sounds normal, no stridor, rhonchi, wheezes, rales.  Abdominal: Soft. BS +,  no distension, tenderness, rebound or guarding.  Musculoskeletal: Normal range of motion. No edema and no tenderness.  Lymphadenopathy: No lymphadenopathy noted, cervical, inguinal. Neuro: Alert. Normal reflexes, muscle tone coordination. No cranial nerve deficit. Skin: Skin is warm and dry. No rash noted. Not diaphoretic. No erythema. No pallor.  Psychiatric: Normal mood and affect.  Behavior, judgment, thought content normal.   Lab Results  Component Value Date   WBC 10.0 01/10/2014   HGB 13.2 01/10/2014   HCT 39.6 01/10/2014   MCV 88.4 01/10/2014   PLT 155 01/10/2014   Lab Results  Component Value Date   CREATININE 0.85 01/10/2014   BUN 17 01/10/2014   NA 140 01/10/2014   K 3.9 01/10/2014   CL 95* 01/10/2014   CO2 38* 01/10/2014    Lab Results  Component Value Date   HGBA1C 6.2 01/10/2014   Lipid Panel     Component Value Date/Time   CHOL 208* 01/10/2014 1215   TRIG 329* 01/10/2014 1215   HDL 92 01/10/2014 1215   CHOLHDL 2.3 01/10/2014 1215   VLDL 66* 01/10/2014 1215   LDLCALC 50 01/10/2014 1215  Assessment and plan:   Patient Active Problem List   Diagnosis Date Noted  . Diabetes 07/31/2013    Priority: Medium - check A1c today - continue current home meds  . Essential hypertension, benign 07/31/2013    Priority: Medium - We have discussed target BP range - I have advised pt to check BP regularly and to call us back if the numbers are higher than 140/90 - discussed the importance of compliance with medical therapy and diet

## 2014-07-17 ENCOUNTER — Ambulatory Visit: Payer: Medicare Other | Admitting: Cardiology

## 2014-07-26 ENCOUNTER — Other Ambulatory Visit: Payer: Self-pay | Admitting: Emergency Medicine

## 2014-08-07 ENCOUNTER — Ambulatory Visit: Payer: Medicare Other | Admitting: Cardiology

## 2014-08-21 ENCOUNTER — Other Ambulatory Visit: Payer: Self-pay | Admitting: Emergency Medicine

## 2014-08-23 ENCOUNTER — Other Ambulatory Visit: Payer: Self-pay | Admitting: Emergency Medicine

## 2014-08-30 ENCOUNTER — Other Ambulatory Visit: Payer: Self-pay | Admitting: Emergency Medicine

## 2014-09-06 ENCOUNTER — Telehealth: Payer: Self-pay | Admitting: Emergency Medicine

## 2014-09-06 NOTE — Telephone Encounter (Signed)
We do not refill lasix for him  I called him and advised that he needs to call PCP for this  Pt verbalized understanding  Nothing further needed

## 2014-09-13 ENCOUNTER — Other Ambulatory Visit: Payer: Self-pay | Admitting: *Deleted

## 2014-09-13 MED ORDER — FUROSEMIDE 20 MG PO TABS: ORAL_TABLET | ORAL | Status: DC

## 2014-09-20 ENCOUNTER — Ambulatory Visit: Payer: Medicare Other | Admitting: Emergency Medicine

## 2014-09-23 ENCOUNTER — Encounter: Payer: Self-pay | Admitting: Emergency Medicine

## 2014-10-04 ENCOUNTER — Telehealth: Payer: Self-pay | Admitting: Emergency Medicine

## 2014-10-04 MED ORDER — PREDNISONE 10 MG PO TABS: ORAL_TABLET | ORAL | Status: DC

## 2014-10-04 MED ORDER — AZITHROMYCIN 250 MG PO TABS: 250.0000 mg | ORAL_TABLET | ORAL | Status: DC

## 2014-10-04 NOTE — Telephone Encounter (Signed)
Spoke with the pt and notified of recs per RB  He verbalized understanding  Rxs were sent to pharm

## 2014-10-04 NOTE — Telephone Encounter (Signed)
Called and spoke with pt and he stated that he is wheezing and his oxygen is dropping down into the 80's with activity.    Pt stated that he started back on the mucinex but this is not helping.  He stated that he is not running a fever now but was a day or two ago.  He stated that his chest feels tight and he is coughiing but unable to get anything up yet.  Pt is requesting to get something called to his pharmacy.  RB please advise. Thanks  Allergies  Allergen Reactions  . Lipitor [Atorvastatin Calcium] Other (See Comments)    Muscle aches    Current Outpatient Prescriptions on File Prior to Visit  Medication Sig Dispense Refill  . acetaminophen-codeine (TYLENOL #3) 300-30 MG per tablet Take 1 tablet by mouth every 4 (four) hours as needed for moderate pain.  45 tablet  0  . albuterol (PROVENTIL HFA;VENTOLIN HFA) 108 (90 BASE) MCG/ACT inhaler Inhale 2 puffs into the lungs every 6 (six) hours as needed for wheezing or shortness of breath.  1 Inhaler  11  . albuterol (PROVENTIL) (2.5 MG/3ML) 0.083% nebulizer solution INHALE CONTENTS OF ONE VIAL IN NEBULIZER 4 TIMES DAILY  150 mL  9  . albuterol (PROVENTIL) (2.5 MG/3ML) 0.083% nebulizer solution Take 3 mLs (2.5 mg total) by nebulization every 6 (six) hours as needed for wheezing or shortness of breath. Dx 496  360 mL  11  . arformoterol (BROVANA) 15 MCG/2ML NEBU Take 2 mLs (15 mcg total) by nebulization 2 (two) times daily.  120 mL  0  . aspirin 325 MG tablet Take 650 mg by mouth every morning.       . budesonide (PULMICORT) 0.25 MG/2ML nebulizer solution Take 0.25 mg by nebulization 2 (two) times daily.      . clopidogrel (PLAVIX) 75 MG tablet Take 1 tablet (75 mg total) by mouth daily.  31 tablet  0  . fluticasone (FLONASE) 50 MCG/ACT nasal spray Place 2 sprays into both nostrils 2 (two) times daily.  16 g  2  . furosemide (LASIX) 20 MG tablet Take 1 tablet (20 mg total) by mouth every morning.  30 tablet  0  . furosemide (LASIX) 20 MG tablet TAKE  1 TABLET (20 MG TOTAL) BY MOUTH DAILY.  90 tablet  1  . ipratropium (ATROVENT) 0.02 % nebulizer solution INHALE CONTENTS OF ONE VIAL IN NEBULIZER 4 TIMES DAILY  300 mL  5  . metFORMIN (GLUCOPHAGE) 500 MG tablet Take 1 tablet (500 mg total) by mouth 2 (two) times daily with a meal.  180 tablet  3  . metoprolol (LOPRESSOR) 50 MG tablet TAKE 1 TABLET (50 MG TOTAL) BY MOUTH 2 (TWO) TIMES DAILY.  60 tablet  5  . nystatin (MYCOSTATIN) 100000 UNIT/ML suspension Take 5 mLs (500,000 Units total) by mouth 4 (four) times daily.  60 mL  0  . pantoprazole (PROTONIX) 40 MG tablet Take 1 tablet (40 mg total) by mouth daily at 12 noon.  30 tablet  0  . predniSONE (DELTASONE) 10 MG tablet Take 10 mg by mouth daily with breakfast. 30 mg daily      . predniSONE (DELTASONE) 10 MG tablet TAKE 3 TABLETS BY MOUTH DAILY  90 tablet  2  . terbutaline (BRETHINE) 2.5 MG tablet Take 1 tablet (2.5 mg total) by mouth every 6 (six) hours.  120 tablet  1  . traMADol (ULTRAM) 50 MG tablet Take 1 tablet (50 mg total) by  mouth every 8 (eight) hours as needed (for breakthrough pain).  45 tablet  0  . traZODone (DESYREL) 50 MG tablet Take 1 tablet (50 mg total) by mouth at bedtime as needed for sleep.  20 tablet  0   No current facility-administered medications on file prior to visit.

## 2014-10-04 NOTE — Telephone Encounter (Signed)
please call in pred taper > Take 40mg  daily for 3 days, then 30mg  daily for 3 days, then 20mg  daily for 3 days, then 10mg  daily for 3 days, then stop Azithromycin z-pack

## 2014-10-27 ENCOUNTER — Encounter (HOSPITAL_COMMUNITY): Payer: Self-pay | Admitting: Emergency Medicine

## 2014-10-27 ENCOUNTER — Emergency Department (HOSPITAL_COMMUNITY): Payer: Medicare Other

## 2014-10-27 ENCOUNTER — Inpatient Hospital Stay (HOSPITAL_COMMUNITY)
Admission: EM | Admit: 2014-10-27 | Discharge: 2014-10-31 | DRG: 190 | Disposition: A | Discharge status: Home / Self Care | Payer: Medicare Other | Attending: Family Medicine | Admitting: Family Medicine

## 2014-10-27 DIAGNOSIS — I5032 Chronic diastolic (congestive) heart failure: Secondary | ICD-10-CM | POA: Diagnosis present

## 2014-10-27 DIAGNOSIS — E785 Hyperlipidemia, unspecified: Secondary | ICD-10-CM | POA: Diagnosis present

## 2014-10-27 DIAGNOSIS — T380X5A Adverse effect of glucocorticoids and synthetic analogues, initial encounter: Secondary | ICD-10-CM | POA: Diagnosis present

## 2014-10-27 DIAGNOSIS — R739 Hyperglycemia, unspecified: Secondary | ICD-10-CM | POA: Diagnosis present

## 2014-10-27 DIAGNOSIS — I2510 Atherosclerotic heart disease of native coronary artery without angina pectoris: Secondary | ICD-10-CM | POA: Diagnosis present

## 2014-10-27 DIAGNOSIS — G8929 Other chronic pain: Secondary | ICD-10-CM | POA: Diagnosis present

## 2014-10-27 DIAGNOSIS — K219 Gastro-esophageal reflux disease without esophagitis: Secondary | ICD-10-CM | POA: Diagnosis present

## 2014-10-27 DIAGNOSIS — R0602 Shortness of breath: Secondary | ICD-10-CM | POA: Diagnosis not present

## 2014-10-27 DIAGNOSIS — M545 Low back pain: Secondary | ICD-10-CM | POA: Diagnosis present

## 2014-10-27 DIAGNOSIS — I2781 Cor pulmonale (chronic): Secondary | ICD-10-CM | POA: Diagnosis present

## 2014-10-27 DIAGNOSIS — I10 Essential (primary) hypertension: Secondary | ICD-10-CM | POA: Diagnosis present

## 2014-10-27 DIAGNOSIS — Z7902 Long term (current) use of antithrombotics/antiplatelets: Secondary | ICD-10-CM

## 2014-10-27 DIAGNOSIS — Z955 Presence of coronary angioplasty implant and graft: Secondary | ICD-10-CM

## 2014-10-27 DIAGNOSIS — R600 Localized edema: Secondary | ICD-10-CM | POA: Diagnosis present

## 2014-10-27 DIAGNOSIS — Z720 Tobacco use: Secondary | ICD-10-CM

## 2014-10-27 DIAGNOSIS — J9622 Acute and chronic respiratory failure with hypercapnia: Secondary | ICD-10-CM | POA: Diagnosis present

## 2014-10-27 DIAGNOSIS — E1165 Type 2 diabetes mellitus with hyperglycemia: Secondary | ICD-10-CM | POA: Diagnosis present

## 2014-10-27 DIAGNOSIS — R079 Chest pain, unspecified: Secondary | ICD-10-CM

## 2014-10-27 DIAGNOSIS — Z6830 Body mass index (BMI) 30.0-30.9, adult: Secondary | ICD-10-CM

## 2014-10-27 DIAGNOSIS — J9621 Acute and chronic respiratory failure with hypoxia: Secondary | ICD-10-CM

## 2014-10-27 DIAGNOSIS — J441 Chronic obstructive pulmonary disease with (acute) exacerbation: Principal | ICD-10-CM | POA: Diagnosis present

## 2014-10-27 DIAGNOSIS — D649 Anemia, unspecified: Secondary | ICD-10-CM | POA: Diagnosis present

## 2014-10-27 DIAGNOSIS — Z7952 Long term (current) use of systemic steroids: Secondary | ICD-10-CM

## 2014-10-27 DIAGNOSIS — R0603 Acute respiratory distress: Secondary | ICD-10-CM | POA: Diagnosis present

## 2014-10-27 DIAGNOSIS — E119 Type 2 diabetes mellitus without complications: Secondary | ICD-10-CM | POA: Diagnosis present

## 2014-10-27 DIAGNOSIS — Z888 Allergy status to other drugs, medicaments and biological substances status: Secondary | ICD-10-CM

## 2014-10-27 DIAGNOSIS — Z9981 Dependence on supplemental oxygen: Secondary | ICD-10-CM

## 2014-10-27 DIAGNOSIS — Z7982 Long term (current) use of aspirin: Secondary | ICD-10-CM

## 2014-10-27 DIAGNOSIS — F17200 Nicotine dependence, unspecified, uncomplicated: Secondary | ICD-10-CM

## 2014-10-27 DIAGNOSIS — E669 Obesity, unspecified: Secondary | ICD-10-CM | POA: Diagnosis present

## 2014-10-27 DIAGNOSIS — J439 Emphysema, unspecified: Secondary | ICD-10-CM

## 2014-10-27 DIAGNOSIS — I252 Old myocardial infarction: Secondary | ICD-10-CM

## 2014-10-27 DIAGNOSIS — G4733 Obstructive sleep apnea (adult) (pediatric): Secondary | ICD-10-CM | POA: Diagnosis present

## 2014-10-27 LAB — I-STAT CHEM 8, ED
BUN: 26 mg/dL — AB (ref 6–23)
CALCIUM ION: 1.17 mmol/L (ref 1.13–1.30)
CHLORIDE: 100 meq/L (ref 96–112)
Creatinine, Ser: 1 mg/dL (ref 0.50–1.35)
Glucose, Bld: 141 mg/dL — ABNORMAL HIGH (ref 70–99)
HCT: 41 % (ref 39.0–52.0)
Hemoglobin: 13.9 g/dL (ref 13.0–17.0)
Potassium: 3.9 mEq/L (ref 3.7–5.3)
Sodium: 142 mEq/L (ref 137–147)
TCO2: 40 mmol/L (ref 0–100)

## 2014-10-27 LAB — CBC WITH DIFFERENTIAL/PLATELET
Basophils Absolute: 0 10*3/uL (ref 0.0–0.1)
Basophils Relative: 0 % (ref 0–1)
EOS ABS: 0 10*3/uL (ref 0.0–0.7)
EOS PCT: 0 % (ref 0–5)
HCT: 40.4 % (ref 39.0–52.0)
HEMOGLOBIN: 12.4 g/dL — AB (ref 13.0–17.0)
Lymphocytes Relative: 7 % — ABNORMAL LOW (ref 12–46)
Lymphs Abs: 0.6 10*3/uL — ABNORMAL LOW (ref 0.7–4.0)
MCH: 29.4 pg (ref 26.0–34.0)
MCHC: 30.7 g/dL (ref 30.0–36.0)
MCV: 95.7 fL (ref 78.0–100.0)
MONOS PCT: 8 % (ref 3–12)
Monocytes Absolute: 0.6 10*3/uL (ref 0.1–1.0)
Neutro Abs: 6.3 10*3/uL (ref 1.7–7.7)
Neutrophils Relative %: 85 % — ABNORMAL HIGH (ref 43–77)
Platelets: 180 10*3/uL (ref 150–400)
RBC: 4.22 MIL/uL (ref 4.22–5.81)
RDW: 15.5 % (ref 11.5–15.5)
WBC: 7.4 10*3/uL (ref 4.0–10.5)

## 2014-10-27 LAB — I-STAT TROPONIN, ED: Troponin i, poc: 0.01 ng/mL (ref 0.00–0.08)

## 2014-10-27 LAB — I-STAT VENOUS BLOOD GAS, ED
Acid-Base Excess: 11 mmol/L — ABNORMAL HIGH (ref 0.0–2.0)
Bicarbonate: 40.7 mEq/L — ABNORMAL HIGH (ref 20.0–24.0)
O2 SAT: 91 %
PCO2 VEN: 78.3 mmHg — AB (ref 45.0–50.0)
TCO2: 43 mmol/L (ref 0–100)
pH, Ven: 7.324 — ABNORMAL HIGH (ref 7.250–7.300)
pO2, Ven: 71 mmHg — ABNORMAL HIGH (ref 30.0–45.0)

## 2014-10-27 LAB — I-STAT CG4 LACTIC ACID, ED: Lactic Acid, Venous: 1.64 mmol/L (ref 0.5–2.2)

## 2014-10-27 MED ORDER — IPRATROPIUM BROMIDE 0.02 % IN SOLN
0.5000 mg | Freq: Once | RESPIRATORY_TRACT | Status: AC
  Administered 2014-10-27: 0.5 mg via RESPIRATORY_TRACT
  Filled 2014-10-27 (×2): qty 2.5

## 2014-10-27 MED ORDER — LEVOFLOXACIN IN D5W 750 MG/150ML IV SOLN
750.0000 mg | Freq: Once | INTRAVENOUS | Status: AC
  Administered 2014-10-27: 750 mg via INTRAVENOUS
  Filled 2014-10-27: qty 150

## 2014-10-27 MED ORDER — MAGNESIUM SULFATE 2 GM/50ML IV SOLN
2.0000 g | Freq: Once | INTRAVENOUS | Status: AC
  Administered 2014-10-27: 2 g via INTRAVENOUS
  Filled 2014-10-27: qty 50

## 2014-10-27 MED ORDER — METHYLPREDNISOLONE SODIUM SUCC 125 MG IJ SOLR
125.0000 mg | Freq: Once | INTRAMUSCULAR | Status: AC
  Administered 2014-10-27: 125 mg via INTRAVENOUS
  Filled 2014-10-27: qty 2

## 2014-10-27 MED ORDER — ALBUTEROL (5 MG/ML) CONTINUOUS INHALATION SOLN
10.0000 mg/h | INHALATION_SOLUTION | RESPIRATORY_TRACT | Status: DC
  Administered 2014-10-27: 10 mg/h via RESPIRATORY_TRACT
  Filled 2014-10-27 (×2): qty 20

## 2014-10-27 NOTE — ED Notes (Signed)
Patient with increased shortness of breath since this morning, continuing throughout the days, was using home nebs with no relief.  Patient was found to be tachypneic and tachycardic.  Patient called EMS for more distress, patient placed on CPAP with some relief of the tachypnea.  Patient has breath sounds of rales, rhonchii and expiratory wheezing.

## 2014-10-27 NOTE — ED Notes (Signed)
VBG results reported to Dr. Mora Bellmanni

## 2014-10-27 NOTE — ED Provider Notes (Signed)
CSN: [id number]     Arrival date & time 10/27/14  2257 History   First MD Initiated Contact with Patient 10/27/14 2301     Chief Complaint  Patient presents with  . Respiratory Distress     (Consider location/radiation/quality/duration/timing/severity/associated sxs/prior Treatment) HPI  Chris ANTKOWIAK is a 63 y.o. male with past medical history of coronary artery disease, COPD, hypertension, hyperlipidemia, acute MI on home O2 2 L nasal cannula coming in with shortness of breath. History is difficult to obtain from patient due to acuity of condition. Per EMS, he was found at home tachypnic, tachycardic and pale. He was in the middle for breathing treatment and oxygen saturation was 90%. He is placed on CPAP and given another albuterol treatment by EMS.  Patient states she's had worsening shortness of breath and productive cough over the last week. As result he's had bilateral rib pain is worse with a cough. He admits to subjective fevers as well. He states he is worsening swelling in his bilateral lower extremities. He has been compliant with all COPD and heart failure medications. He has history of one intubation in the past due to COPD.     Past Medical History  Diagnosis Date  . COPD (chronic obstructive pulmonary disease)   . CAD (coronary artery disease)   . Hypertension   . Hyperlipidemia   . Osteomyelitis   . Myocardial infarction     "I've had a couple since 2006" (12/26/2013)  . Anginal pain   . Asthma   . Pneumonia     "have had it several times; maybe now too": (12/26/2013)  . Shortness of breath     "all the time" (12/26/2013)  . On home oxygen therapy     "2-3L; 24/7" (12/26/2013)  . GERD (gastroesophageal reflux disease)   . Daily headache   . Stroke     "they say I've had a couple" denies residual on 12/26/2013  . Arthritis     "hands; feet" (12/26/2013)  . Osteomyelitis hip     "right"  . Chronic lower back pain    Past Surgical History  Procedure Laterality Date  .  Appendectomy  1963  . Cardiac catheterization  ? 2009; ?2012  . Coronary angioplasty with stent placement  2006; ?2008; ?2010    "2 + 2 + 1"   Family History  Problem Relation Age of Onset  . Coronary artery disease    . Hypertension     History  Substance Use Topics  . Smoking status: Former Smoker -- 3.00 packs/day for 51 years    Types: Cigarettes    Quit date: 11/19/2013  . Smokeless tobacco: Never Used  . Alcohol Use: No    Review of Systems  Unable to perform ROS: Acuity of condition      Allergies  Lipitor  Home Medications   Prior to Admission medications   Medication Sig Start Date End Date Taking? Authorizing Provider  albuterol (PROVENTIL HFA;VENTOLIN HFA) 108 (90 BASE) MCG/ACT inhaler Inhale 2 puffs into the lungs every 6 (six) hours as needed for wheezing or shortness of breath. 01/22/14  Yes Leslye Peer, MD  albuterol (PROVENTIL) (2.5 MG/3ML) 0.083% nebulizer solution INHALE CONTENTS OF ONE VIAL IN NEBULIZER 4 TIMES DAILY   Yes Leslye Peer, MD  aspirin 325 MG tablet Take 650 mg by mouth every morning.    Yes Historical Provider, MD  budesonide (PULMICORT) 0.25 MG/2ML nebulizer solution Take 0.25 mg by nebulization 2 (two) times daily.  Yes Historical Provider, MD  clopidogrel (PLAVIX) 75 MG tablet Take 1 tablet (75 mg total) by mouth daily. 12/25/13  Yes Rodolph Bonganiel Thompson V, MD  fluticasone (FLONASE) 50 MCG/ACT nasal spray Place 2 sprays into both nostrils 2 (two) times daily. 12/29/13  Yes Estela Isaiah BlakesY [name], MD  furosemide (LASIX) 20 MG tablet Take 1 tablet (20 mg total) by mouth every morning. 12/25/13  Yes Rodolph Bonganiel Thompson V, MD  ipratropium (ATROVENT) 0.02 % nebulizer solution INHALE CONTENTS OF ONE VIAL IN NEBULIZER 4 TIMES DAILY 07/26/14  Yes Leslye Peerobert S Byrum, MD  metFORMIN (GLUCOPHAGE) 500 MG tablet Take 1 tablet (500 mg total) by mouth 2 (two) times daily with a meal. 01/11/14  Yes Richarda OverlieNayana Abrol, MD  metoprolol (LOPRESSOR) 50 MG tablet TAKE 1 TABLET (50  MG TOTAL) BY MOUTH 2 (TWO) TIMES DAILY. 06/24/14  Yes Leslye Peerobert S Byrum, MD  predniSONE (DELTASONE) 10 MG tablet Take 10 mg by mouth daily with breakfast. 30 mg daily 02/27/14  Yes Leslye Peerobert S Byrum, MD  acetaminophen-codeine (TYLENOL #3) 300-30 MG per tablet Take 1 tablet by mouth every 4 (four) hours as needed for moderate pain. 07/08/14   Alison MurrayAlma M Devine, MD  albuterol (PROVENTIL) (2.5 MG/3ML) 0.083% nebulizer solution Take 3 mLs (2.5 mg total) by nebulization every 6 (six) hours as needed for wheezing or shortness of breath. Dx 496 08/23/14   Leslye Peerobert S Byrum, MD  arformoterol (BROVANA) 15 MCG/2ML NEBU Take 2 mLs (15 mcg total) by nebulization 2 (two) times daily. 12/29/13   Henderson CloudEstela Y [name], MD  azithromycin (ZITHROMAX) 250 MG tablet Take 1 tablet (250 mg total) by mouth as directed. 10/04/14   Leslye Peerobert S Byrum, MD  furosemide (LASIX) 20 MG tablet TAKE 1 TABLET (20 MG TOTAL) BY MOUTH DAILY. 09/13/14   Leslye Peerobert S Byrum, MD  nystatin (MYCOSTATIN) 100000 UNIT/ML suspension Take 5 mLs (500,000 Units total) by mouth 4 (four) times daily. 01/09/14   Belkys A Regalado, MD  pantoprazole (PROTONIX) 40 MG tablet Take 1 tablet (40 mg total) by mouth daily at 12 noon. 12/25/13   Rodolph Bonganiel Thompson V, MD  predniSONE (DELTASONE) 10 MG tablet Take 3 tablets daily OR as directed 10/04/14   Leslye Peerobert S Byrum, MD  terbutaline (BRETHINE) 2.5 MG tablet Take 1 tablet (2.5 mg total) by mouth every 6 (six) hours. 12/29/13   Henderson CloudEstela Y [name], MD  traMADol (ULTRAM) 50 MG tablet Take 1 tablet (50 mg total) by mouth every 8 (eight) hours as needed (for breakthrough pain). 07/08/14   Alison MurrayAlma M Devine, MD  traZODone (DESYREL) 50 MG tablet Take 1 tablet (50 mg total) by mouth at bedtime as needed for sleep. 12/25/13   Rodolph Bonganiel Thompson V, MD   BP 154/81 mmHg  Pulse 97  Temp(Src) 97.4 F (36.3 C) (Axillary)  Resp 25  SpO2 100% Physical Exam  Constitutional: He is oriented to person, place, and time. Vital signs are normal. He appears  well-developed and well-nourished.  Non-toxic appearance. He does not appear ill. He appears distressed.  HENT:  Head: Normocephalic and atraumatic.  Eyes: Conjunctivae and EOM are normal. Pupils are equal, round, and reactive to light. No scleral icterus.  Neck: Normal range of motion. Neck supple. No tracheal deviation, no edema, no erythema and normal range of motion present. No thyroid mass and no thyromegaly present.  Cardiovascular: Normal rate, regular rhythm, S1 normal, S2 normal, normal heart sounds, intact distal pulses and normal pulses.  Exam reveals no gallop and no friction rub.  No murmur heard. Pulses:      Radial pulses are 2+ on the right side, and 2+ on the left side.       Dorsalis pedis pulses are 2+ on the right side, and 2+ on the left side.  Pulmonary/Chest: He is in respiratory distress. He has wheezes. He has no rhonchi. He has no rales.  Decreased breath sounds bilaterally. Minimal expiratory wheezing heard bilaterally. Patient is more decreased on the left. There is tachypnea noted, increase use of accessory muscles.  Abdominal: Soft. Normal appearance and bowel sounds are normal. He exhibits no distension, no ascites and no mass. There is no hepatosplenomegaly. There is no tenderness. There is no rebound, no guarding and no CVA tenderness.  Musculoskeletal: Normal range of motion. He exhibits edema. He exhibits no tenderness.  2+ bilateral lower extremity edema  Lymphadenopathy:    He has no cervical adenopathy.  Neurological: He is alert and oriented to person, place, and time. He has normal strength. No cranial nerve deficit or sensory deficit. He exhibits normal muscle tone. GCS eye subscore is 4. GCS verbal subscore is 5. GCS motor subscore is 6.  Skin: Skin is warm and intact. No petechiae and no rash noted. He is diaphoretic. No erythema. No pallor.  Psychiatric: He has a normal mood and affect. His behavior is normal. Judgment normal.  Nursing note and vitals  reviewed.   ED Course  Procedures (including critical care time) Labs Review Labs Reviewed  CBC WITH DIFFERENTIAL - Abnormal; Notable for the following:    Hemoglobin 12.4 (*)    Neutrophils Relative % 85 (*)    Lymphocytes Relative 7 (*)    Lymphs Abs 0.6 (*)    All other components within normal limits  BASIC METABOLIC PANEL - Abnormal; Notable for the following:    CO2 37 (*)    Glucose, Bld 140 (*)    All other components within normal limits  I-STAT CHEM 8, ED - Abnormal; Notable for the following:    BUN 26 (*)    Glucose, Bld 141 (*)    All other components within normal limits  I-STAT VENOUS BLOOD GAS, ED - Abnormal; Notable for the following:    pH, Ven 7.324 (*)    pCO2, Ven 78.3 (*)    pO2, Ven 71.0 (*)    Bicarbonate 40.7 (*)    Acid-Base Excess 11.0 (*)    All other components within normal limits  CULTURE, BLOOD (ROUTINE X 2)  CULTURE, BLOOD (ROUTINE X 2)  MRSA PCR SCREENING  CULTURE, EXPECTORATED SPUTUM-ASSESSMENT  PRO B NATRIURETIC PEPTIDE  PROTIME-INR  COMPREHENSIVE METABOLIC PANEL  CBC  HEMOGLOBIN A1C  LEGIONELLA ANTIGEN, URINE  STREP PNEUMONIAE URINARY ANTIGEN  I-STAT CG4 LACTIC ACID, ED  Rosezena Sensor, ED    Imaging Review Dg Chest Port 1 View  10/27/2014   CLINICAL DATA:  Chest pain, cough and shortness of breath.  EXAM: PORTABLE CHEST - 1 VIEW  COMPARISON:  PA and lateral chest 01/04/2014. Single view of the chest 02/07/2013.  FINDINGS: The chest is hyperexpanded with attenuation of the pulmonary vasculature but the lungs are clear. Heart size is normal. No pneumothorax or pleural effusion.  IMPRESSION: No acute disease.  Emphysema.   Electronically Signed   By: [name] M.D.   On: 10/27/2014 23:21     EKG Interpretation   Date/Time:  Sunday October 27 2014 23:02:12 EST Ventricular Rate:  92 PR Interval:  154 QRS Duration: 95 QT Interval:  361  QTC Calculation: 447 R Axis:   95 Text Interpretation:  Sinus rhythm Right axis  deviation No significant  change since last tracing Confirmed by Erroll Lunani, Tanmay Halteman Ayokunle [phone]([id number]) on  10/27/2014 11:35:28 PM      MDM   Final diagnoses:  COPD exacerbation    Patient presents to the emergency department for shortness of breath. Bedside ultrasound did not reveal any B-lines in his lungs. I believe this is like a COPD exacerbation. He was immediately placed on BiPAP, given breathing treatments, Solu-Medrol, 2 g of magnesium. Upon my repeat assessment the patient appears much more comfortable. Tachypnea has resolved and use of accessory muscles have resolved. Patient also states he feels better. He denies any recent admissions. He was given Levaquin despite negative chest x-ray. He'll be admitted to step down for continued treatment.  Emergency Ultrasound: Limited Thoracic Performed and interpreted by Dr Mora Bellmanni Longitudinal view of anterior left and right lung fields in real-time with linear probe. Indication: respiratory distress Findings: positive lung sliding positive B lines Interpretation: no evidence of pneumothorax.       CRITICAL CARE Performed by: Tomasita CrumbleNI,[employer]   Total critical care time: 40min  Critical care time was exclusive of separately billable procedures and treating other patients.  Critical care was necessary to treat or prevent imminent or life-threatening deterioration.  Critical care was time spent personally by me on the following activities: development of treatment plan with patient and/or surrogate as well as nursing, discussions with consultants, evaluation of patient's response to treatment, examination of patient, obtaining history from patient or surrogate, ordering and performing treatments and interventions, ordering and review of laboratory studies, ordering and review of radiographic studies, pulse oximetry and re-evaluation of patient's condition.     Tomasita CrumbleAdeleke Tayler Lassen, MD 10/28/14 [phone]

## 2014-10-28 ENCOUNTER — Encounter (HOSPITAL_COMMUNITY): Payer: Self-pay | Admitting: *Deleted

## 2014-10-28 DIAGNOSIS — Z955 Presence of coronary angioplasty implant and graft: Secondary | ICD-10-CM | POA: Diagnosis not present

## 2014-10-28 DIAGNOSIS — J9621 Acute and chronic respiratory failure with hypoxia: Secondary | ICD-10-CM | POA: Diagnosis present

## 2014-10-28 DIAGNOSIS — E785 Hyperlipidemia, unspecified: Secondary | ICD-10-CM | POA: Diagnosis present

## 2014-10-28 DIAGNOSIS — Z7902 Long term (current) use of antithrombotics/antiplatelets: Secondary | ICD-10-CM | POA: Diagnosis not present

## 2014-10-28 DIAGNOSIS — K219 Gastro-esophageal reflux disease without esophagitis: Secondary | ICD-10-CM | POA: Diagnosis present

## 2014-10-28 DIAGNOSIS — I252 Old myocardial infarction: Secondary | ICD-10-CM | POA: Diagnosis not present

## 2014-10-28 DIAGNOSIS — E1165 Type 2 diabetes mellitus with hyperglycemia: Secondary | ICD-10-CM | POA: Diagnosis present

## 2014-10-28 DIAGNOSIS — E669 Obesity, unspecified: Secondary | ICD-10-CM | POA: Diagnosis present

## 2014-10-28 DIAGNOSIS — I10 Essential (primary) hypertension: Secondary | ICD-10-CM

## 2014-10-28 DIAGNOSIS — T380X5A Adverse effect of glucocorticoids and synthetic analogues, initial encounter: Secondary | ICD-10-CM | POA: Diagnosis present

## 2014-10-28 DIAGNOSIS — J441 Chronic obstructive pulmonary disease with (acute) exacerbation: Principal | ICD-10-CM

## 2014-10-28 DIAGNOSIS — R739 Hyperglycemia, unspecified: Secondary | ICD-10-CM | POA: Diagnosis present

## 2014-10-28 DIAGNOSIS — Z888 Allergy status to other drugs, medicaments and biological substances status: Secondary | ICD-10-CM | POA: Diagnosis not present

## 2014-10-28 DIAGNOSIS — I2781 Cor pulmonale (chronic): Secondary | ICD-10-CM | POA: Diagnosis present

## 2014-10-28 DIAGNOSIS — E119 Type 2 diabetes mellitus without complications: Secondary | ICD-10-CM | POA: Diagnosis present

## 2014-10-28 DIAGNOSIS — J9622 Acute and chronic respiratory failure with hypercapnia: Secondary | ICD-10-CM | POA: Diagnosis present

## 2014-10-28 DIAGNOSIS — G4733 Obstructive sleep apnea (adult) (pediatric): Secondary | ICD-10-CM | POA: Diagnosis present

## 2014-10-28 DIAGNOSIS — Z7952 Long term (current) use of systemic steroids: Secondary | ICD-10-CM | POA: Diagnosis not present

## 2014-10-28 DIAGNOSIS — Z720 Tobacco use: Secondary | ICD-10-CM | POA: Diagnosis not present

## 2014-10-28 DIAGNOSIS — G8929 Other chronic pain: Secondary | ICD-10-CM | POA: Diagnosis present

## 2014-10-28 DIAGNOSIS — I5032 Chronic diastolic (congestive) heart failure: Secondary | ICD-10-CM | POA: Diagnosis present

## 2014-10-28 DIAGNOSIS — R0602 Shortness of breath: Secondary | ICD-10-CM | POA: Diagnosis present

## 2014-10-28 DIAGNOSIS — I2510 Atherosclerotic heart disease of native coronary artery without angina pectoris: Secondary | ICD-10-CM | POA: Diagnosis present

## 2014-10-28 DIAGNOSIS — M545 Low back pain: Secondary | ICD-10-CM | POA: Diagnosis present

## 2014-10-28 DIAGNOSIS — Z7982 Long term (current) use of aspirin: Secondary | ICD-10-CM | POA: Diagnosis not present

## 2014-10-28 DIAGNOSIS — J989 Respiratory disorder, unspecified: Secondary | ICD-10-CM | POA: Insufficient documentation

## 2014-10-28 DIAGNOSIS — Z6830 Body mass index (BMI) 30.0-30.9, adult: Secondary | ICD-10-CM | POA: Diagnosis not present

## 2014-10-28 DIAGNOSIS — Z9981 Dependence on supplemental oxygen: Secondary | ICD-10-CM | POA: Diagnosis not present

## 2014-10-28 DIAGNOSIS — R600 Localized edema: Secondary | ICD-10-CM | POA: Diagnosis present

## 2014-10-28 DIAGNOSIS — J439 Emphysema, unspecified: Secondary | ICD-10-CM

## 2014-10-28 DIAGNOSIS — D649 Anemia, unspecified: Secondary | ICD-10-CM | POA: Diagnosis present

## 2014-10-28 LAB — COMPREHENSIVE METABOLIC PANEL
ALT: 33 U/L (ref 0–53)
AST: 16 U/L (ref 0–37)
Albumin: 3.4 g/dL — ABNORMAL LOW (ref 3.5–5.2)
Alkaline Phosphatase: 63 U/L (ref 39–117)
Anion gap: 8 (ref 5–15)
BUN: 22 mg/dL (ref 6–23)
CALCIUM: 8.8 mg/dL (ref 8.4–10.5)
CHLORIDE: 99 meq/L (ref 96–112)
CO2: 37 mEq/L — ABNORMAL HIGH (ref 19–32)
Creatinine, Ser: 0.61 mg/dL (ref 0.50–1.35)
GFR calc Af Amer: >90 mL/min (ref >90)
GLUCOSE: 241 mg/dL — AB (ref 70–99)
Potassium: 4.5 mEq/L (ref 3.7–5.3)
SODIUM: 144 meq/L (ref 137–147)
Total Protein: 6 g/dL (ref 6.0–8.3)

## 2014-10-28 LAB — BASIC METABOLIC PANEL
Anion gap: 8 (ref 5–15)
BUN: 21 mg/dL (ref 6–23)
CALCIUM: 9.1 mg/dL (ref 8.4–10.5)
CO2: 37 mEq/L — ABNORMAL HIGH (ref 19–32)
Chloride: 101 mEq/L (ref 96–112)
Creatinine, Ser: 0.7 mg/dL (ref 0.50–1.35)
GFR calc Af Amer: >90 mL/min (ref >90)
Glucose, Bld: 140 mg/dL — ABNORMAL HIGH (ref 70–99)
Potassium: 4.1 mEq/L (ref 3.7–5.3)
Sodium: 146 mEq/L (ref 137–147)

## 2014-10-28 LAB — CBC
HCT: 39 % (ref 39.0–52.0)
Hemoglobin: 11.8 g/dL — ABNORMAL LOW (ref 13.0–17.0)
MCH: 28.6 pg (ref 26.0–34.0)
MCHC: 30.3 g/dL (ref 30.0–36.0)
MCV: 94.4 fL (ref 78.0–100.0)
PLATELETS: 167 10*3/uL (ref 150–400)
RBC: 4.13 MIL/uL — AB (ref 4.22–5.81)
RDW: 15.5 % (ref 11.5–15.5)
WBC: 6.4 10*3/uL (ref 4.0–10.5)

## 2014-10-28 LAB — PROTIME-INR
INR: 0.85 (ref 0.00–1.49)
PROTHROMBIN TIME: 11.7 s (ref 11.6–15.2)

## 2014-10-28 LAB — GLUCOSE, CAPILLARY
GLUCOSE-CAPILLARY: 204 mg/dL — AB (ref 70–99)
GLUCOSE-CAPILLARY: 281 mg/dL — AB (ref 70–99)
Glucose-Capillary: 297 mg/dL — ABNORMAL HIGH (ref 70–99)

## 2014-10-28 LAB — EXPECTORATED SPUTUM ASSESSMENT W GRAM STAIN, RFLX TO RESP C

## 2014-10-28 LAB — MRSA PCR SCREENING: MRSA BY PCR: NEGATIVE

## 2014-10-28 LAB — HEMOGLOBIN A1C
Hgb A1c MFr Bld: 7.4 % — ABNORMAL HIGH (ref <5.7)
Mean Plasma Glucose: 166 mg/dL — ABNORMAL HIGH (ref <117)

## 2014-10-28 LAB — STREP PNEUMONIAE URINARY ANTIGEN: STREP PNEUMO URINARY ANTIGEN: NEGATIVE

## 2014-10-28 LAB — EXPECTORATED SPUTUM ASSESSMENT W REFEX TO RESP CULTURE

## 2014-10-28 LAB — PRO B NATRIURETIC PEPTIDE: Pro B Natriuretic peptide (BNP): 89 pg/mL (ref 0–125)

## 2014-10-28 MED ORDER — METHYLPREDNISOLONE SODIUM SUCC 125 MG IJ SOLR
60.0000 mg | Freq: Four times a day (QID) | INTRAMUSCULAR | Status: DC
  Administered 2014-10-28 (×3): 60 mg via INTRAVENOUS
  Filled 2014-10-28 (×6): qty 0.96

## 2014-10-28 MED ORDER — ALBUTEROL SULFATE (2.5 MG/3ML) 0.083% IN NEBU
2.5000 mg | INHALATION_SOLUTION | RESPIRATORY_TRACT | Status: DC | PRN
Start: 2014-10-28 — End: 2014-10-31
  Administered 2014-10-29 – 2014-10-31 (×4): 2.5 mg via RESPIRATORY_TRACT
  Filled 2014-10-28 (×4): qty 3

## 2014-10-28 MED ORDER — ASPIRIN 325 MG PO TABS
650.0000 mg | ORAL_TABLET | Freq: Every morning | ORAL | Status: DC
  Administered 2014-10-28 – 2014-10-31 (×4): 650 mg via ORAL
  Filled 2014-10-28 (×4): qty 2

## 2014-10-28 MED ORDER — ACETAMINOPHEN 650 MG RE SUPP: 650.0000 mg | Freq: Four times a day (QID) | RECTAL | Status: DC | PRN

## 2014-10-28 MED ORDER — FUROSEMIDE 20 MG PO TABS
20.0000 mg | ORAL_TABLET | Freq: Every morning | ORAL | Status: DC
  Administered 2014-10-28 – 2014-10-31 (×4): 20 mg via ORAL
  Filled 2014-10-28 (×4): qty 1

## 2014-10-28 MED ORDER — INFLUENZA VAC SPLIT QUAD 0.5 ML IM SUSY
0.5000 mL | PREFILLED_SYRINGE | INTRAMUSCULAR | Status: AC
  Administered 2014-10-29: 0.5 mL via INTRAMUSCULAR
  Filled 2014-10-28: qty 0.5

## 2014-10-28 MED ORDER — BUDESONIDE 0.25 MG/2ML IN SUSP
0.2500 mg | Freq: Two times a day (BID) | RESPIRATORY_TRACT | Status: DC
  Administered 2014-10-28 – 2014-10-30 (×5): 0.25 mg via RESPIRATORY_TRACT
  Filled 2014-10-28 (×7): qty 2

## 2014-10-28 MED ORDER — ACETAMINOPHEN 325 MG PO TABS
650.0000 mg | ORAL_TABLET | Freq: Four times a day (QID) | ORAL | Status: DC | PRN
  Administered 2014-10-28 – 2014-10-29 (×3): 650 mg via ORAL
  Filled 2014-10-28 (×3): qty 2

## 2014-10-28 MED ORDER — METOPROLOL TARTRATE 50 MG PO TABS
50.0000 mg | ORAL_TABLET | Freq: Two times a day (BID) | ORAL | Status: DC
  Administered 2014-10-28 – 2014-10-31 (×8): 50 mg via ORAL
  Filled 2014-10-28 (×9): qty 1

## 2014-10-28 MED ORDER — ENOXAPARIN SODIUM 40 MG/0.4ML ~~LOC~~ SOLN
40.0000 mg | SUBCUTANEOUS | Status: DC
  Administered 2014-10-28 – 2014-10-31 (×4): 40 mg via SUBCUTANEOUS
  Filled 2014-10-28 (×4): qty 0.4

## 2014-10-28 MED ORDER — LEVOFLOXACIN IN D5W 500 MG/100ML IV SOLN
500.0000 mg | INTRAVENOUS | Status: DC
  Administered 2014-10-28 – 2014-10-29 (×2): 500 mg via INTRAVENOUS
  Filled 2014-10-28 (×2): qty 100

## 2014-10-28 MED ORDER — INSULIN ASPART 100 UNIT/ML ~~LOC~~ SOLN
0.0000 [IU] | Freq: Three times a day (TID) | SUBCUTANEOUS | Status: DC
  Administered 2014-10-28: 3 [IU] via SUBCUTANEOUS
  Administered 2014-10-28 – 2014-10-29 (×3): 5 [IU] via SUBCUTANEOUS
  Administered 2014-10-29: 2 [IU] via SUBCUTANEOUS
  Administered 2014-10-30 (×2): 3 [IU] via SUBCUTANEOUS
  Administered 2014-10-30: 1 [IU] via SUBCUTANEOUS
  Administered 2014-10-31: 5 [IU] via SUBCUTANEOUS

## 2014-10-28 MED ORDER — GUAIFENESIN ER 600 MG PO TB12
600.0000 mg | ORAL_TABLET | Freq: Two times a day (BID) | ORAL | Status: DC
  Administered 2014-10-28 – 2014-10-31 (×7): 600 mg via ORAL
  Filled 2014-10-28 (×8): qty 1

## 2014-10-28 MED ORDER — CLOPIDOGREL BISULFATE 75 MG PO TABS
75.0000 mg | ORAL_TABLET | Freq: Every day | ORAL | Status: DC
  Administered 2014-10-28 – 2014-10-31 (×4): 75 mg via ORAL
  Filled 2014-10-28 (×4): qty 1

## 2014-10-28 MED ORDER — ONDANSETRON HCL 4 MG/2ML IJ SOLN: 4.0000 mg | Freq: Four times a day (QID) | INTRAMUSCULAR | Status: DC | PRN

## 2014-10-28 MED ORDER — SODIUM CHLORIDE 0.9 % IJ SOLN
3.0000 mL | Freq: Two times a day (BID) | INTRAMUSCULAR | Status: DC
  Administered 2014-10-28 – 2014-10-31 (×8): 3 mL via INTRAVENOUS

## 2014-10-28 MED ORDER — IPRATROPIUM-ALBUTEROL 0.5-2.5 (3) MG/3ML IN SOLN
3.0000 mL | RESPIRATORY_TRACT | Status: DC
  Administered 2014-10-28 (×4): 3 mL via RESPIRATORY_TRACT
  Filled 2014-10-28 (×4): qty 3

## 2014-10-28 MED ORDER — ONDANSETRON HCL 4 MG PO TABS: 4.0000 mg | ORAL_TABLET | Freq: Four times a day (QID) | ORAL | Status: DC | PRN

## 2014-10-28 MED ORDER — METHYLPREDNISOLONE SODIUM SUCC 125 MG IJ SOLR
60.0000 mg | Freq: Two times a day (BID) | INTRAMUSCULAR | Status: DC
  Administered 2014-10-29 – 2014-10-30 (×3): 60 mg via INTRAVENOUS
  Filled 2014-10-28 (×5): qty 0.96

## 2014-10-28 MED ORDER — IPRATROPIUM-ALBUTEROL 0.5-2.5 (3) MG/3ML IN SOLN
3.0000 mL | Freq: Four times a day (QID) | RESPIRATORY_TRACT | Status: DC
  Administered 2014-10-28 – 2014-10-29 (×5): 3 mL via RESPIRATORY_TRACT
  Filled 2014-10-28 (×5): qty 3

## 2014-10-28 NOTE — Care Management Note (Addendum)
    Page 1 of 1   10/31/2014     4:05:34 PM CARE MANAGEMENT NOTE 10/31/2014  Patient:  [name],[name]   Account Number:  [id number]  Date Initiated:  10/28/2014  Documentation initiated by:  MAYO,HENRIETTA  Subjective/Objective Assessment:   dx COPD exac; lives with sister    PCP  Flo Shankslugemiga Jegede with [facility]     Action/Plan:   pt eval- rec hhpt- patient does not want hhpt   Anticipated DC Date:  10/31/2014   Anticipated DC Plan:  HOME/SELF CARE      DC Planning Services  CM consult  Medication Assistance      Choice offered to / List presented to:             Status of service:  Completed, signed off Medicare Important Message given?  YES (If response is "NO", the following Medicare IM given date fields will be blank) Date Medicare IM given:  10/30/2014 Medicare IM given by:  [facility],ALESIA Date Additional Medicare IM given:   Additional Medicare IM given by:    Discharge Disposition:  HOME/SELF CARE  Per UR Regulation:  Reviewed for med. necessity/level of care/duration of stay  If discussed at Long Length of Stay Meetings, dates discussed:    Comments:  10/30/2014 1600 NCM spoke to pt and states he lives alone. He does not want HH PT at this time. He has RW at home. Pt does want to have sleep study and CPAP at home. [facility] at 8:00 pm. Center will mail out a package for pt to complete. Notified [facility] for CPAP for scheduled dc home tom. [facility].  10/28/14 0954 Verdis PrimeHenrietta Mayo RN MSN BSN CCM Pt has been getting Pulmicort through [employer] pt assistance program, states he needs new application to complete and send in with his financial information. Provided application.  Pt also states he does not have Medicare B or D.  States pulmonoligist has just recently started billing for his office visits.  Provided pt with [facility] contact information, advised him to call to determine eligibility for LIS as  well as to request assistance enrolling in B and D.

## 2014-10-28 NOTE — ED Notes (Signed)
Not sleeping for 2-3 weeks.  At present the pt is comfortable.  Recent cold

## 2014-10-28 NOTE — Plan of Care (Signed)
Problem: ICU Phase Progression Outcomes Goal: Dyspnea controlled at rest Outcome: Progressing Goal: Hemodynamically stable Outcome: Progressing Goal: Pain controlled with appropriate interventions Outcome: Completed/Met Date Met:  10/28/14 Goal: Voiding-avoid urinary catheter unless indicated Outcome: Completed/Met Date Met:  10/28/14  Problem: Phase I Progression Outcomes Goal: O2 sats > or equal 90% or at baseline Outcome: Completed/Met Date Met:  10/28/14 Goal: Dyspnea controlled at rest Outcome: Progressing Goal: Pain controlled Outcome: Completed/Met Date Met:  10/28/14

## 2014-10-28 NOTE — H&P (Signed)
[facility] History and Physical  Patient: [name]  ZOX:[id number]  DOB: Dec 30, 1950  DOS: the patient was seen and examined on 10/28/2014 PCP: Jeanann Lewandowsky, MD  Chief Complaint: shortness of breath  HPI: [name] is a 63 y.o. male with Past medical history of COPD, coronary artery disease, hypertension, on chronic oxygen, GERD. The patient is presenting with complaints of shortness of breath which has been progressively worsening since last 2 days ongoing since last few weeks. He also had cough with chills. Denies any fever or chest pain denies any nausea complains of some chronic abdominal pain. Denies any constipation or diarrhea. Denies any burning urination or decreased urination. He complains of chronic leg swelling which has not changed. Denies any immobilization. He was recently treated for as outpatient COPD exacerbation.  The patient is coming from home. And at his baseline independent for most of his ADL.  Review of Systems: as mentioned in the history of present illness.  A Comprehensive review of the other systems is negative.  Past Medical History  Diagnosis Date  . COPD (chronic obstructive pulmonary disease)   . CAD (coronary artery disease)   . Hypertension   . Hyperlipidemia   . Osteomyelitis   . Myocardial infarction     "I've had a couple since 2006" (12/26/2013)  . Anginal pain   . Asthma   . Pneumonia     "have had it several times; maybe now too": (12/26/2013)  . Shortness of breath     "all the time" (12/26/2013)  . On home oxygen therapy     "2-3L; 24/7" (12/26/2013)  . GERD (gastroesophageal reflux disease)   . Daily headache   . Stroke     "they say I've had a couple" denies residual on 12/26/2013  . Arthritis     "hands; feet" (12/26/2013)  . Osteomyelitis hip     "right"  . Chronic lower back pain    Past Surgical History  Procedure Laterality Date  . Appendectomy  1963  . Cardiac catheterization  ? 2009; ?2012  . Coronary  angioplasty with stent placement  2006; ?2008; ?2010    "2 + 2 + 1"   Social History:  reports that he quit smoking about 4 weeks ago. His smoking use included Cigarettes. He has a 12.75 pack-year smoking history. He has never used smokeless tobacco. He reports that he does not drink alcohol or use illicit drugs.  Allergies  Allergen Reactions  . Lipitor [Atorvastatin Calcium] Other (See Comments)    Muscle aches    Family History  Problem Relation Age of Onset  . Coronary artery disease    . Hypertension      Prior to Admission medications   Medication Sig Start Date End Date Taking? Authorizing Provider  albuterol (PROVENTIL HFA;VENTOLIN HFA) 108 (90 BASE) MCG/ACT inhaler Inhale 2 puffs into the lungs every 6 (six) hours as needed for wheezing or shortness of breath. 01/22/14  Yes Leslye Peer, MD  albuterol (PROVENTIL) (2.5 MG/3ML) 0.083% nebulizer solution INHALE CONTENTS OF ONE VIAL IN NEBULIZER 4 TIMES DAILY   Yes Leslye Peer, MD  aspirin 325 MG tablet Take 650 mg by mouth every morning.    Yes Historical Provider, MD  budesonide (PULMICORT) 0.25 MG/2ML nebulizer solution Take 0.25 mg by nebulization 2 (two) times daily.   Yes Historical Provider, MD  clopidogrel (PLAVIX) 75 MG tablet Take 1 tablet (75 mg total) by mouth daily. 12/25/13  Yes Rodolph Bong, MD  fluticasone (FLONASE) 50 MCG/ACT nasal spray Place 2 sprays into both nostrils 2 (two) times daily. 12/29/13  Yes Estela Isaiah BlakesY [name], MD  furosemide (LASIX) 20 MG tablet Take 1 tablet (20 mg total) by mouth every morning. 12/25/13  Yes Rodolph Bonganiel Thompson V, MD  ipratropium (ATROVENT) 0.02 % nebulizer solution INHALE CONTENTS OF ONE VIAL IN NEBULIZER 4 TIMES DAILY 07/26/14  Yes Leslye Peerobert S Byrum, MD  metFORMIN (GLUCOPHAGE) 500 MG tablet Take 1 tablet (500 mg total) by mouth 2 (two) times daily with a meal. 01/11/14  Yes Richarda OverlieNayana Abrol, MD  metoprolol (LOPRESSOR) 50 MG tablet TAKE 1 TABLET (50 MG TOTAL) BY MOUTH 2 (TWO) TIMES  DAILY. 06/24/14  Yes Leslye Peerobert S Byrum, MD  predniSONE (DELTASONE) 10 MG tablet Take 10 mg by mouth daily with breakfast. 30 mg daily 02/27/14  Yes Leslye Peerobert S Byrum, MD  acetaminophen-codeine (TYLENOL #3) 300-30 MG per tablet Take 1 tablet by mouth every 4 (four) hours as needed for moderate pain. 07/08/14   Alison MurrayAlma M Devine, MD  albuterol (PROVENTIL) (2.5 MG/3ML) 0.083% nebulizer solution Take 3 mLs (2.5 mg total) by nebulization every 6 (six) hours as needed for wheezing or shortness of breath. Dx 496 08/23/14   Leslye Peerobert S Byrum, MD  arformoterol (BROVANA) 15 MCG/2ML NEBU Take 2 mLs (15 mcg total) by nebulization 2 (two) times daily. 12/29/13   Henderson CloudEstela Y [name], MD  azithromycin (ZITHROMAX) 250 MG tablet Take 1 tablet (250 mg total) by mouth as directed. 10/04/14   Leslye Peerobert S Byrum, MD  furosemide (LASIX) 20 MG tablet TAKE 1 TABLET (20 MG TOTAL) BY MOUTH DAILY. 09/13/14   Leslye Peerobert S Byrum, MD  nystatin (MYCOSTATIN) 100000 UNIT/ML suspension Take 5 mLs (500,000 Units total) by mouth 4 (four) times daily. 01/09/14   Belkys A Regalado, MD  pantoprazole (PROTONIX) 40 MG tablet Take 1 tablet (40 mg total) by mouth daily at 12 noon. 12/25/13   Rodolph Bonganiel Thompson V, MD  predniSONE (DELTASONE) 10 MG tablet Take 3 tablets daily OR as directed 10/04/14   Leslye Peerobert S Byrum, MD  terbutaline (BRETHINE) 2.5 MG tablet Take 1 tablet (2.5 mg total) by mouth every 6 (six) hours. 12/29/13   Henderson CloudEstela Y [name], MD  traMADol (ULTRAM) 50 MG tablet Take 1 tablet (50 mg total) by mouth every 8 (eight) hours as needed (for breakthrough pain). 07/08/14   Alison MurrayAlma M Devine, MD  traZODone (DESYREL) 50 MG tablet Take 1 tablet (50 mg total) by mouth at bedtime as needed for sleep. 12/25/13   Rodolph Bonganiel Thompson V, MD    Physical Exam: Filed Vitals:   10/28/14 0203 10/28/14 0300 10/28/14 0334 10/28/14 0345  BP:  137/72  138/74  Pulse: 86 84  83  Temp:      TempSrc:    Oral  Resp: 16   21  Height:      Weight:    97.7 kg (215 lb 6.2 oz)  SpO2: 97%   94% 95%    General: Alert, Awake and Oriented to Time, Place and Person. Appear in mild distress Eyes: PERRL ENT: Oral Mucosa clear moist. Neck: no JVD Cardiovascular: S1 and S2 Present, no Murmur, Peripheral Pulses Present Respiratory: Bilateral Air entry equal and Decreased, no Crackles, bilateral expiratory wheezes Abdomen: Bowel Sound present, Soft and non tender Skin: no Rash Extremities: bilateral Pedal edema, no calf tenderness Neurologic: Grossly no focal neuro deficit.  Labs on Admission:  CBC:  Recent Labs Lab 10/27/14 2315 10/27/14 2343 10/28/14 0324  WBC  7.4  --  6.4  NEUTROABS 6.3  --   --   HGB 12.4* 13.9 11.8*  HCT 40.4 41.0 39.0  MCV 95.7  --  94.4  PLT 180  --  167    CMP     Component Value Date/Time   NA 144 10/28/2014 0324   K 4.5 10/28/2014 0324   CL 99 10/28/2014 0324   CO2 37* 10/28/2014 0324   GLUCOSE 241* 10/28/2014 0324   BUN 22 10/28/2014 0324   CREATININE 0.61 10/28/2014 0324   CREATININE 0.85 01/10/2014 1215   CALCIUM 8.8 10/28/2014 0324   PROT 6.0 10/28/2014 0324   ALBUMIN 3.4* 10/28/2014 0324   AST 16 10/28/2014 0324   ALT 33 10/28/2014 0324   ALKPHOS 63 10/28/2014 0324   BILITOT <0.2* 10/28/2014 0324   GFRNONAA >90 10/28/2014 0324   GFRNONAA >89 01/10/2014 1215   GFRAA >90 10/28/2014 0324   GFRAA >89 01/10/2014 1215    No results for input(s): LIPASE, AMYLASE in the last 168 hours. No results for input(s): AMMONIA in the last 168 hours.  No results for input(s): CKTOTAL, CKMB, CKMBINDEX, TROPONINI in the last 168 hours. BNP (last 3 results)  Recent Labs  12/15/13 2200 12/26/13 0203 10/27/14 2311  PROBNP 78.6 100.5 89.0    Radiological Exams on Admission: Dg Chest Port 1 View  10/27/2014   CLINICAL DATA:  Chest pain, cough and shortness of breath.  EXAM: PORTABLE CHEST - 1 VIEW  COMPARISON:  PA and lateral chest 01/04/2014. Single view of the chest 02/07/2013.  FINDINGS: The chest is hyperexpanded with attenuation  of the pulmonary vasculature but the lungs are clear. Heart size is normal. No pneumothorax or pleural effusion.  IMPRESSION: No acute disease.  Emphysema.   Electronically Signed   By: Drusilla Kannerhomas  Dalessio M.D.   On: 10/27/2014 23:21    Assessment/Plan Principal Problem:   Acute exacerbation of chronic obstructive pulmonary disease (COPD) Active Problems:   Essential hypertension   Coronary atherosclerosis   COPD (chronic obstructive pulmonary disease)   1. Acute exacerbation of chronic obstructive pulmonary disease (COPD) The patient is presenting with complaints of cough and shortness of breath. He is found to have elevated PCO2 with increased oxygen requirement suggesting acute on chronic respiratory failure due to acute exacerbation of COPD. At present he'll be admitted in stepdown unit. Continue use of BiPAP as needed oxygen as needed. IV Solu-Medrol 60 mg every 6 hours and the IV Levaquin. Duo nebs. Follow sputum cultures and urine antigens.  2.chronic diastolic heart failure. Patient is on Lasix patient is proBNP does not appear elevated. Patient denies any significant weight changes. At present we will continue her home Lasix dose.  3.hypertension. Continue home medication.  4.history of CAD. Continue Plavix. Patient takes 650 mg aspirin in the morning which I would continue  Advance goals of care discussion: full code   DVT Prophylaxis: subcutaneous Heparin Nutrition: cardiac diet  Disposition: Admitted to inpatient in stepdown unit.  Author: Lynden OxfordPranav Rajiv Parlato, MD [facility] Pager: [phone]   If 7PM-7AM, please contact night-coverage [email or web address] Password [email or web address]

## 2014-10-28 NOTE — Progress Notes (Signed)
Pt's CAT is now done and at MD's request placed pt on Venturi Mask pt tolerating well at this time informed pt if he felt SOB to let his RN know and RT will place pt back on CPAP.

## 2014-10-28 NOTE — Progress Notes (Signed)
[facility]  Haig ProphetUlysses G Kondo XBJ:[id number] DOB: 08/26/1951 DOA: 10/27/2014 PCP: Jeanann LewandowskyJEGEDE, OLUGBEMIGA, MD  Brief narrative: 63 yo male with hx of 02 dependent COPD, CAD with prior stents, and GERD who presented with progressive shortness of breath and dyspnea on exertion for several weeks which had worsened over the 2 days prior to coming to the hospital. These symptoms were associated with coughing and chills but no fever. Was recently treated by his Pulmonologist for a COPD exacerbation.  When EMS arrived at the patient's home he was noted to be tachycardic and pale.  Family reportedly had been giving him multiple breathing treatments and was in the process of receiving a treatment when EMS arrived. His O2 saturation was 90%. Paramedics placed him on CPAP with improvement in symptoms.   Upon arrival to the ER he was stable on CPAP.  His chest x-ray showed no acute process.  He did not have leukocytosis, BNP was 89 and point-of-care troponin was 0.01.  Since admission his symptoms have improved significantly. Patient endorses continued issue with pleuritic-type pain that radiates around the ribs bilaterally into the mid back. He is now having productive frothy white cough. He reports he feels he is about 50% of his usual baseline. He has weaned to nasal cannula oxygen.  He is moving air adequately with coarse lung sounds without wheezing.  Patient is on chronic prednisone at home but has developed mild hyperglycemia with use of IV Solu-Medrol.   HPI/Subjective: Alert and reports improvement in respiratory symptoms.  Assessment/Plan:    Acute on chronic respiratory failure with hypoxia/Acute exacerbation of chronic obstructive pulmonary disease  Improving- weaning oxygen toward baseline but still with exercise intolerance-cont IV steroids and other supportive care- home dose Prednisone 30 mg daily-in process of obtaining insurance approval for pulmicort nebs  (see OP pulmonology notes)-pt reports rare tobacco use-cont anbx's for likely bronchitis  Chronic diastolic dysfunction,grade 1 Compensated- cont Lasix cautiously since has concurrent URI    Essential hypertension BP controlled-on Lopressor    Bilateral lower extremity edema Last echo in 2014 with preserved LV function but poor windows so ? RHF/cor pulmonale-w/new sx's of edema will repeat this admit    Coronary atherosclerosis No sx's at present but FU on echo-cont ASA, plavix    Acute hyperglycemia Due to higher dose steroids- follow- SSI if needed   DVT prophylaxis: Lovenox Code Status: Full Family Communication: No family at bedside Disposition Plan/Expected LOS: Transfer to floor  Consultants: None  Procedures: ECHO pending  Antibiotics: Levaquin 11/08 >  Objective: Blood pressure 147/77, pulse 79, temperature 97.8 F (36.6 C), temperature source Oral, resp. rate 19, height 5\' 11"  (1.803 m), weight 215 lb 6.2 oz (97.7 kg), SpO2 99 %.  Intake/Output Summary (Last 24 hours) at 10/28/14 1211 Last data filed at 10/28/14 1000  Gross per 24 hour  Intake    369 ml  Output    100 ml  Net    269 ml   Exam: F/u exam completed   Scheduled Meds:  Scheduled Meds: . aspirin  650 mg Oral q morning - 10a  . budesonide  0.25 mg Nebulization BID  . clopidogrel  75 mg Oral Daily  . enoxaparin (LOVENOX) injection  40 mg Subcutaneous Q24H  . furosemide  20 mg Oral q morning - 10a  . guaiFENesin  600 mg Oral BID  . [START ON 10/29/2014] Influenza vac split quadrivalent PF  0.5 mL Intramuscular Tomorrow-1000  .  insulin aspart  0-9 Units Subcutaneous TID WC  . ipratropium-albuterol  3 mL Nebulization Q4H  . [START ON 10/29/2014] levofloxacin (LEVAQUIN) IV  500 mg Intravenous Q24H  . methylPREDNISolone (SOLU-MEDROL) injection  60 mg Intravenous Q6H  . metoprolol  50 mg Oral BID  . sodium chloride  3 mL Intravenous Q12H   Data Reviewed: Basic Metabolic Panel:  Recent  Labs Lab 10/27/14 2315 10/27/14 2343 10/28/14 0324  NA 146 142 144  K 4.1 3.9 4.5  CL 101 100 99  CO2 37*  --  37*  GLUCOSE 140* 141* 241*  BUN 21 26* 22  CREATININE 0.70 1.00 0.61  CALCIUM 9.1  --  8.8   Liver Function Tests:  Recent Labs Lab 10/28/14 0324  AST 16  ALT 33  ALKPHOS 63  BILITOT <0.2*  PROT 6.0  ALBUMIN 3.4*   CBC:  Recent Labs Lab 10/27/14 2315 10/27/14 2343 10/28/14 0324  WBC 7.4  --  6.4  NEUTROABS 6.3  --   --   HGB 12.4* 13.9 11.8*  HCT 40.4 41.0 39.0  MCV 95.7  --  94.4  PLT 180  --  167   BNP (last 3 results)  Recent Labs  12/15/13 2200 12/26/13 0203 10/27/14 2311  PROBNP 78.6 100.5 89.0    Recent Results (from the past 240 hour(s))  MRSA PCR Screening     Status: None   Collection Time: 10/28/14  1:47 AM  Result Value Ref Range Status   MRSA by PCR NEGATIVE NEGATIVE Final    Comment:        The GeneXpert MRSA Assay (FDA approved for NASAL specimens only), is one component of a comprehensive MRSA colonization surveillance program. It is not intended to diagnose MRSA infection nor to guide or monitor treatment for MRSA infections.   Culture, sputum-assessment     Status: None   Collection Time: 10/28/14  8:22 AM  Result Value Ref Range Status   Specimen Description SPUTUM  Final   Special Requests NONE  Final   Sputum evaluation   Final    MICROSCOPIC FINDINGS SUGGEST THAT THIS SPECIMEN IS NOT REPRESENTATIVE OF LOWER RESPIRATORY SECRETIONS. PLEASE RECOLLECT. Gram Stain Report Called to,Read Back By and Verified With: TATA CARBONE,RN AT 0920 10/28/14 BY K BARR    Report Status 10/28/2014 FINAL  Final     Studies:  Recent x-ray studies have been reviewed in detail by the Attending Physician  Time spent :  25+ mins  Junious Silkllison Ellis, ANP [facility] Office  [phone] Pager [phone]  On-Call/Text Page:      [email or web address]      password [email or web address]  If 7PM-7AM, please contact  night-coverage [email or web address] Password [email or web address] 10/28/2014, 12:11 PM   LOS: 1 day   I have personally examined this patient and reviewed the entire database. I have reviewed the above note, made any necessary editorial changes, and agree with its content.  Lonia BloodJeffrey T. Johanny Segers, MD [facility]

## 2014-10-28 NOTE — ED Notes (Signed)
Report called to rn on 2600

## 2014-10-28 NOTE — Progress Notes (Signed)
Pt transported from ED to 2600 on CPAP without complications.

## 2014-10-28 NOTE — Progress Notes (Signed)
63yo male c/o SOB since the am, no relief w/ nebs at home, to begin IV ABX for COPD exacerbation. Rec'd Levaquin 750mg  IV in ED; will continue with Levaquin 500mg  IV Q24H for CrCl ~4090ml/min and monitor CBC, Cx.  Vernard GamblesVeronda Vedder Brittian, PharmD, BCPS 10/28/2014 2:11 AM

## 2014-10-28 NOTE — Progress Notes (Signed)
Utilization review completed.  

## 2014-10-28 NOTE — Plan of Care (Signed)
Problem: ICU Phase Progression Outcomes Goal: O2 sats trending toward baseline Outcome: Progressing     

## 2014-10-29 DIAGNOSIS — R0603 Acute respiratory distress: Secondary | ICD-10-CM | POA: Diagnosis present

## 2014-10-29 DIAGNOSIS — R600 Localized edema: Secondary | ICD-10-CM

## 2014-10-29 DIAGNOSIS — R7309 Other abnormal glucose: Secondary | ICD-10-CM

## 2014-10-29 DIAGNOSIS — J441 Chronic obstructive pulmonary disease with (acute) exacerbation: Secondary | ICD-10-CM | POA: Insufficient documentation

## 2014-10-29 DIAGNOSIS — R0600 Dyspnea, unspecified: Secondary | ICD-10-CM

## 2014-10-29 DIAGNOSIS — J9621 Acute and chronic respiratory failure with hypoxia: Secondary | ICD-10-CM

## 2014-10-29 DIAGNOSIS — I359 Nonrheumatic aortic valve disorder, unspecified: Secondary | ICD-10-CM

## 2014-10-29 LAB — LEGIONELLA ANTIGEN, URINE

## 2014-10-29 LAB — GLUCOSE, CAPILLARY
GLUCOSE-CAPILLARY: 274 mg/dL — AB (ref 70–99)
Glucose-Capillary: 162 mg/dL — ABNORMAL HIGH (ref 70–99)
Glucose-Capillary: 263 mg/dL — ABNORMAL HIGH (ref 70–99)
Glucose-Capillary: 277 mg/dL — ABNORMAL HIGH (ref 70–99)

## 2014-10-29 MED ORDER — IPRATROPIUM-ALBUTEROL 0.5-2.5 (3) MG/3ML IN SOLN
3.0000 mL | Freq: Four times a day (QID) | RESPIRATORY_TRACT | Status: DC
  Administered 2014-10-30 – 2014-10-31 (×7): 3 mL via RESPIRATORY_TRACT
  Filled 2014-10-29 (×7): qty 3

## 2014-10-29 MED ORDER — LIVING WELL WITH DIABETES BOOK
Freq: Once | Status: AC
  Administered 2014-10-29: 11:00:00
  Filled 2014-10-29: qty 1

## 2014-10-29 MED ORDER — HYDROCODONE-ACETAMINOPHEN 5-325 MG PO TABS
1.0000 | ORAL_TABLET | Freq: Once | ORAL | Status: AC
  Administered 2014-10-29: 1 via ORAL
  Filled 2014-10-29: qty 1

## 2014-10-29 MED ORDER — HYDROCODONE-ACETAMINOPHEN 5-325 MG PO TABS
1.0000 | ORAL_TABLET | Freq: Four times a day (QID) | ORAL | Status: DC | PRN
Start: 2014-10-29 — End: 2014-10-31
  Administered 2014-10-29 – 2014-10-31 (×6): 1 via ORAL
  Filled 2014-10-29 (×6): qty 1

## 2014-10-29 NOTE — Evaluation (Signed)
Physical Therapy Evaluation Patient Details Name: [name] MRN: [id number] DOB: 08/01/1951 Today's Date: 10/29/2014   History of Present Illness  [name] is a 63 y.o. male with Past medical history of COPD, coronary artery disease, hypertension, on chronic oxygen, GERD. The patient is presenting with complaints of shortness of breath which has been progressively worsening since last 2 days ongoing since last few weeks. He also had cough with chills. He was recently treated for a COPD exacerbation as an outpatient.  Clinical Impression  Pt admitted with the above. Pt currently with functional limitations due to the deficits listed below (see PT Problem List). At the time of PT eval pt was able to perform transfers with min guard assist. States that he required a seated rest break after ambulating bed to sink in room and declined any further ambulation during session. Pt will benefit from skilled PT to increase their independence and safety with mobility to allow discharge to the venue listed below.       Follow Up Recommendations Home health PT;Supervision - Intermittent    Equipment Recommendations  None recommended by PT    Recommendations for Other Services       Precautions / Restrictions Precautions Precautions: Fall Restrictions Weight Bearing Restrictions: No      Mobility  Bed Mobility               General bed mobility comments: Pt sitting up on EOB when PT arrived.  Transfers Overall transfer level: Needs assistance Equipment used: None Transfers: Sit to/from UGI CorporationStand;Stand Pivot Transfers Sit to Stand: Min guard Stand pivot transfers: Min guard       General transfer comment: Pt was able to perform transfers with close guard for safety. Pt did not show any unsteadiness or LOB during pivotal steps around to the chair.   Ambulation/Gait             General Gait Details: Pt reports being very "worn out" from ambulating around the room earlier,  and declines any further mobility.   Stairs            Wheelchair Mobility    Modified Rankin (Stroke Patients Only)       Balance                                             Pertinent Vitals/Pain Pain Assessment: No/denies pain    Home Living Family/patient expects to be discharged to:: Private residence Living Arrangements: Other relatives Available Help at Discharge: Available 24 hours/day Type of Home: House Home Access: Level entry     Home Layout: One level Home Equipment: Cane - single point;Walker - 2 wheels (O2 all the time) Additional Comments: If goes home with daughter she has 5 steps to enter and can stay on main level.     Prior Function Level of Independence: Independent with assistive device(s)         Comments: Occasionally uses cane. independent with all ADL's however last few days PTA not able to get in the shower.      Hand Dominance   Dominant Hand: Right    Extremity/Trunk Assessment   Upper Extremity Assessment: Defer to OT evaluation           Lower Extremity Assessment: Generalized weakness (grossly 4/5 strength bilaterally.)      Cervical / Trunk Assessment: Normal  Communication   Communication: No difficulties  Cognition Arousal/Alertness: Awake/alert Behavior During Therapy: WFL for tasks assessed/performed Overall Cognitive Status: Within Functional Limits for tasks assessed                      General Comments      Exercises        Assessment/Plan    PT Assessment Patient needs continued PT services  PT Diagnosis Difficulty walking;Generalized weakness   PT Problem List Decreased strength;Decreased range of motion;Decreased activity tolerance;Decreased balance;Decreased mobility;Decreased knowledge of use of DME;Decreased safety awareness;Decreased knowledge of precautions;Cardiopulmonary status limiting activity  PT Treatment Interventions DME instruction;Gait training;Stair  training;Functional mobility training;Therapeutic activities;Therapeutic exercise;Neuromuscular re-education;Patient/family education   PT Goals (Current goals can be found in the Care Plan section) Acute Rehab PT Goals Patient Stated Goal: To return home independently.  PT Goal Formulation: With patient Time For Goal Achievement: 11/12/14 Potential to Achieve Goals: Good    Frequency Min 3X/week   Barriers to discharge        Co-evaluation               End of Session Equipment Utilized During Treatment: Oxygen Activity Tolerance: Patient limited by fatigue Patient left: in chair;with call bell/phone within reach Nurse Communication: Mobility status         Time: [id number] PT Time Calculation (min) (ACUTE ONLY): 19 min   Charges:   PT Evaluation $Initial PT Evaluation Tier I: 1 Procedure PT Treatments $Therapeutic Activity: 8-22 mins   PT G Codes:          [name], Loralee Weitzman 10/29/2014, 1:30 PM   Conni SlipperLaura Geryl Dohn, PT, DPT Acute Rehabilitation Services Pager: [phone]

## 2014-10-29 NOTE — Progress Notes (Signed)
  Echocardiogram 2D Echocardiogram has been performed.  [name] 10/29/2014, 10:33 AM

## 2014-10-29 NOTE — Plan of Care (Signed)
Problem: ICU Phase Progression Outcomes Goal: Dyspnea controlled at rest Outcome: Progressing     

## 2014-10-29 NOTE — Progress Notes (Signed)
Consult received for new onset DM. [facility] I entered an OP ed order if patient qualifies. Also ordered Education here by RN, watching. ed'l videos, dietician consult, cbg practice, etc. Ordered the pt ed booklet, Living Well with Diabetes. Will talk with patient as needed. Thank you, Lenor CoffinAnn Froilan Mclean, RN, CNS, Diabetes Coordinator [phone])

## 2014-10-29 NOTE — Progress Notes (Signed)
CPT VEST on hold due to pt states he still feels short of breath at times. Neb tx done. Will cont to follow. RN aware.

## 2014-10-29 NOTE — Progress Notes (Signed)
[facility] PROGRESS NOTE  [name] ZOX:[id number] DOB: 03/04/1951 DOA: 10/27/2014 PCP: Jeanann LewandowskyJEGEDE, OLUGBEMIGA, MD  Assessment/Plan:  COPD exacerbation -O2 sats 99% on 4L [place]- minimal expiratory wheezes bilaterally -CXR- no acute abnormalities -Levofloxacin (day 2) -Continue IV Solumedrol 60mg  q 12 -Continue Albuterol inhaler, and Duoneb/Pulmicort nebs -Influenza PCR, Urine legionella/strep antigen negative -Sputum culture pending -Chest physiotherapy and Incentive Spirometry ordered -PT consult pending -Admits to tobacco use in recent past-counseled on smoking cessation  Chronic diastolic CHF -2D echo 2014- EF 55-60%; grade 1 diastolic dysfucntion -Continue home Lasix -Repeat 2D echo pending  DM -A1c 7.4- prior A1C 6.8 -Could be exacerbated by chronic steroid use at home -Continue SSI -Diabetes education consult pending -Will resume home Metformin on discharge  Pain initiated by coughing -Pain along bilateral ribs that radiates to mid back, worsened with coughing -PRN pain meds  Normocytic Anemia -Likely dilutional -continue to monitor CBC  HTN -BP stable -Continue home Metoprolol  CAD -Continue home regimen of Plavix and ASA  Obesity -BMI 30.1   DVT Prophylaxis:  SQ Heparin Code Status: Full Family Communication: No family at bedside Disposition Plan: Home when stable   Consultants:  None  Procedures:  2D echocardiogram  Antibiotics:  Levofloxacin (day 2)  HPI/Subjective: Chris PennaUlysses [name] is a 63 yo male with PMH of O2 dependent COPD , CAD with prior stents, HTN, and CHF who presented with worsening shortness of breath and dyspnea on exertion for several weeks. He had associated cough and chills but no fever. He received multiple breathing treatments at home without improvement.  Was recently treated by his Pulmonologist for a COPD exacerbation. When EMS arrived patient was tachycardic, pale, and had 90% O2 sats. He was placed on CPAP with  much improvement in symptoms. In ED, patient was stable on CPAP, CXR without any acute process. Patient was admitted to SDU.  Patient has since improved significantly,  and is now weaned to [place] O2.  Patient is transferred to floor. Patient is on chronic prednisone at home but has developed mild hyperglycemia with use of IV Solu-Medrol.    Patient states he is still not at his baseline.  Continues to endorse pain that radiates around his ribs bilaterally into his mid back, worsened by coughing. States  cough is now productive of frothy white sputum.  Objective: Filed Vitals:   10/29/14 0909  BP: 142/88  Pulse: 78  Temp:   Resp:     Intake/Output Summary (Last 24 hours) at 10/29/14 0928 Last data filed at 10/29/14 0636  Gross per 24 hour  Intake   1303 ml  Output   2000 ml  Net   -697 ml   Filed Weights   10/28/14 0142 10/28/14 0345 10/29/14 0621  Weight: 97.7 kg (215 lb 6.2 oz) 97.7 kg (215 lb 6.2 oz) 96.253 kg (212 lb 3.2 oz)    Exam:  Gen: Alert and oriented overweight Caucaisan male in NAD. On 4L [place] HEENT: Normocephalic, atraumatic.  Pupils symmertrical.  Moist mucosa.   Chest: good air movement bilaterally, diffuse coarse sounds, minimal bilateral expiratory wheezes Cardiac: Regular rate and rhythm, S1-S2, no rubs murmurs or gallops  Abdomen: soft, non tender, non distended, +bowel sounds. No guarding or rigidity  Extremities: Symmetrical in appearance without cyanosis.  2+bilateral pedal edema Neurological: Alert awake oriented to time place and person.  Psychiatric: Appears normal.   Data Reviewed: Basic Metabolic Panel:  Recent Labs Lab 10/27/14 2315 10/27/14 2343 10/28/14 0324  NA 146 142 144  K  4.1 3.9 4.5  CL 101 100 99  CO2 37*  --  37*  GLUCOSE 140* 141* 241*  BUN 21 26* 22  CREATININE 0.70 1.00 0.61  CALCIUM 9.1  --  8.8   Liver Function Tests:  Recent Labs Lab 10/28/14 0324  AST 16  ALT 33  ALKPHOS 63  BILITOT <0.2*  PROT 6.0  ALBUMIN 3.4*    No results for input(s): LIPASE, AMYLASE in the last 168 hours. No results for input(s): AMMONIA in the last 168 hours. CBC:  Recent Labs Lab 10/27/14 2315 10/27/14 2343 10/28/14 0324  WBC 7.4  --  6.4  NEUTROABS 6.3  --   --   HGB 12.4* 13.9 11.8*  HCT 40.4 41.0 39.0  MCV 95.7  --  94.4  PLT 180  --  167   Cardiac Enzymes: No results for input(s): CKTOTAL, CKMB, CKMBINDEX, TROPONINI in the last 168 hours. BNP (last 3 results)  Recent Labs  12/15/13 2200 12/26/13 0203 10/27/14 2311  PROBNP 78.6 100.5 89.0   CBG:  Recent Labs Lab 10/28/14 1321 10/28/14 1731 10/28/14 2112 10/29/14 0807  GLUCAP 204* 297* 281* 162*    Recent Results (from the past 240 hour(s))  Culture, blood (routine x 2)     Status: None (Preliminary result)   Collection Time: 10/27/14 11:15 PM  Result Value Ref Range Status   Specimen Description BLOOD RIGHT ARM  Final   Special Requests BOTTLES DRAWN AEROBIC AND ANAEROBIC 5CC EACH  Final   Culture  Setup Time   Final    10/28/2014 02:51 Performed at [facility]    Culture   Final           BLOOD CULTURE RECEIVED NO GROWTH TO DATE CULTURE WILL BE HELD FOR 5 DAYS BEFORE ISSUING A FINAL NEGATIVE REPORT Performed at [facility]    Report Status PENDING  Incomplete  Culture, blood (routine x 2)     Status: None (Preliminary result)   Collection Time: 10/27/14 11:24 PM  Result Value Ref Range Status   Specimen Description BLOOD RIGHT FOREARM  Final   Special Requests BOTTLES DRAWN AEROBIC AND ANAEROBIC 10CC EACH  Final   Culture  Setup Time   Final    10/28/2014 02:52 Performed at [facility]    Culture   Final           BLOOD CULTURE RECEIVED NO GROWTH TO DATE CULTURE WILL BE HELD FOR 5 DAYS BEFORE ISSUING A FINAL NEGATIVE REPORT Performed at [facility]    Report Status PENDING  Incomplete  MRSA PCR Screening     Status: None   Collection Time: 10/28/14  1:47 AM  Result Value Ref Range Status    MRSA by PCR NEGATIVE NEGATIVE Final    Comment:        The GeneXpert MRSA Assay (FDA approved for NASAL specimens only), is one component of a comprehensive MRSA colonization surveillance program. It is not intended to diagnose MRSA infection nor to guide or monitor treatment for MRSA infections.   Culture, sputum-assessment     Status: None   Collection Time: 10/28/14  8:22 AM  Result Value Ref Range Status   Specimen Description SPUTUM  Final   Special Requests NONE  Final   Sputum evaluation   Final    MICROSCOPIC FINDINGS SUGGEST THAT THIS SPECIMEN IS NOT REPRESENTATIVE OF LOWER RESPIRATORY SECRETIONS. PLEASE RECOLLECT. Gram Stain Report Called to,Read Back By and Verified With: TATA CARBONE,RN  AT 0920 10/28/14 BY K BARR    Report Status 10/28/2014 FINAL  Final     Studies: Dg Chest Port 1 View  10/27/2014   CLINICAL DATA:  Chest pain, cough and shortness of breath.  EXAM: PORTABLE CHEST - 1 VIEW  COMPARISON:  PA and lateral chest 01/04/2014. Single view of the chest 02/07/2013.  FINDINGS: The chest is hyperexpanded with attenuation of the pulmonary vasculature but the lungs are clear. Heart size is normal. No pneumothorax or pleural effusion.  IMPRESSION: No acute disease.  Emphysema.   Electronically Signed   By: [name] M.D.   On: 10/27/2014 23:21    Scheduled Meds: . aspirin  650 mg Oral q morning - 10a  . budesonide  0.25 mg Nebulization BID  . clopidogrel  75 mg Oral Daily  . enoxaparin (LOVENOX) injection  40 mg Subcutaneous Q24H  . furosemide  20 mg Oral q morning - 10a  . guaiFENesin  600 mg Oral BID  . Influenza vac split quadrivalent PF  0.5 mL Intramuscular Tomorrow-1000  . insulin aspart  0-9 Units Subcutaneous TID WC  . ipratropium-albuterol  3 mL Nebulization QID  . levofloxacin (LEVAQUIN) IV  500 mg Intravenous Q24H  . methylPREDNISolone (SOLU-MEDROL) injection  60 mg Intravenous Q12H  . metoprolol  50 mg Oral BID  . sodium chloride  3 mL  Intravenous Q12H   Continuous Infusions:   Active Problems:   Essential hypertension   Coronary atherosclerosis   Acute on chronic respiratory failure with hypoxia   Acute exacerbation of chronic obstructive pulmonary disease (COPD)   Bilateral lower extremity edema   Acute hyperglycemia    Time spent: 41    Illa Level [facility]  [facility] Pager [phone]. If 7PM-7AM, please contact night-coverage at [email or web address], password [facility] 10/29/2014, 9:28 AM  LOS: 2 days

## 2014-10-29 NOTE — Progress Notes (Signed)
Patient cannot tolerate CPT at this time. RT will continue to assist as needed.

## 2014-10-29 NOTE — Progress Notes (Signed)
Patient does not feel he can tolerate any CPT at this time. Neb TX just finished. RT will continue to assist as needed.

## 2014-10-29 NOTE — Progress Notes (Signed)
PRN neb tx done for increased WOB

## 2014-10-30 LAB — GLUCOSE, CAPILLARY
GLUCOSE-CAPILLARY: 236 mg/dL — AB (ref 70–99)
GLUCOSE-CAPILLARY: 137 mg/dL — AB (ref 70–99)
Glucose-Capillary: 222 mg/dL — ABNORMAL HIGH (ref 70–99)
Glucose-Capillary: 129 mg/dL — ABNORMAL HIGH (ref 70–99)

## 2014-10-30 MED ORDER — PREDNISONE 50 MG PO TABS
60.0000 mg | ORAL_TABLET | Freq: Every day | ORAL | Status: DC
  Administered 2014-10-31: 60 mg via ORAL
  Filled 2014-10-30 (×2): qty 1

## 2014-10-30 MED ORDER — LEVOFLOXACIN 500 MG PO TABS
500.0000 mg | ORAL_TABLET | Freq: Every day | ORAL | Status: DC
  Administered 2014-10-30 – 2014-10-31 (×2): 500 mg via ORAL
  Filled 2014-10-30 (×3): qty 1

## 2014-10-30 MED ORDER — PREDNISONE 10 MG PO TABS
60.0000 mg | ORAL_TABLET | Freq: Two times a day (BID) | ORAL | Status: DC
  Filled 2014-10-30 (×2): qty 1

## 2014-10-30 MED ORDER — METFORMIN HCL 500 MG PO TABS
500.0000 mg | ORAL_TABLET | Freq: Every day | ORAL | Status: DC
  Administered 2014-10-31: 500 mg via ORAL
  Filled 2014-10-30 (×2): qty 1

## 2014-10-30 MED ORDER — MOMETASONE FURO-FORMOTEROL FUM 100-5 MCG/ACT IN AERO
2.0000 | INHALATION_SPRAY | Freq: Two times a day (BID) | RESPIRATORY_TRACT | Status: DC
  Administered 2014-10-30 – 2014-10-31 (×2): 2 via RESPIRATORY_TRACT
  Filled 2014-10-30: qty 8.8

## 2014-10-30 NOTE — Plan of Care (Signed)
Problem: Inadequate Intake (NI-2.1) Goal: Food and/or nutrient delivery Individualized approach for food/nutrient provision.  RD consulted for nutrition education regarding diabetes.     Lab Results  Component Value Date    HGBA1C 7.4* 10/28/2014    RD provided "Carbohydrate Counting for People with Diabetes" handout from the Academy of Nutrition and Dietetics. Discussed different food groups and their effects on blood sugar, emphasizing carbohydrate-containing foods. Provided list of carbohydrates and recommended serving sizes of common foods.  Discussed importance of controlled and consistent carbohydrate intake throughout the day. Provided examples of ways to balance meals/snacks and encouraged intake of high-fiber, whole grain complex carbohydrates. Teach back method used.  Expect good compliance.  Body mass index is 29.61 kg/(m^2). Pt meets criteria for overweight based on current BMI.  Current diet order is Heart Healthy/Carb Modified, patient is consuming approximately 100% of meals at this time. Labs and medications reviewed. No further nutrition interventions warranted at this time. RD contact information provided. If additional nutrition issues arise, please re-consult RD.  Petrea Fredenburg A. Mayford KnifeWilliams, RD, LDN Pager: [phone] After hours Pager: [phone]

## 2014-10-30 NOTE — Progress Notes (Signed)
RT Note: Rt called Anders SimmondsPete Babcock, NP on 10/30/14 @ 1131 regarding the patients order for a NIF/VC order that is showing up on his work list as needing to be completed. Cindee Lameete stated that the order can be discontinued. RT is unable to discontinue the order in EPIC. RT contacted EPIC to assist in finding the order and discontinuing it but they have not called back yet. NIF/VC has not been done this shift per Anders SimmondsPete Babcock, NP. Patient has been maintaining all day with no issues nor distress. Rt will continue to monitor.

## 2014-10-30 NOTE — Progress Notes (Signed)
Transitional Care Clinic Care Coordination Note:  Admit date:  10/27/2014 Discharge date: TBD Discharge Disposition: Home with family-? [facility] PT Patient contact information:  [phone] (patient's cell), [phone] (contact for daughter-Faith)  This Case Manager reviewed patient's EMR and determined patient would benefit from chronic care management services through the [facility]. Patient has had 4 hospital admissions in the last year. Patient has a history of chronic diastolic failure, diabetes mellitus, COPD and is receiving treatment for COPD exacerbation. This Case Manager met with patient to discuss the services and medical management that can be provided at the [facility].  Written consent obtained for Transitional Care Clinic follow-up upon discharge.  Patient aware he will receive a post-discharge transition of care phone call within 24-48 hours of discharge and will be evaluated for close follow-up for up to 30 days following discharge. Transitional Care Clinic appointment scheduled for 11/04/2014 at 1415.  Explained that [facility] services does not replace or interfere with any services that are arranged by inpatient case management or social work. For additional questions or referrals please contact [facility] at [phone].    Consent for Chronic Care Management for Transitional Care Clinic signed by patient.  Patient scheduled for Transitional Care appointment on 11/04/14 at 1415.  Clinic information and appointment time provided to patient. Appointment time on AVS.   Assessment:       Home Environment: Patient living with his daughter in a private residence.       Support System: daughter       Level of functioning: Patient is independent with daily activities. Patient does indicate he has experienced weakness with mobility. Inpatient PT recommending [facility].  Inpatient Case Manager to discuss with patient.       Home DME: Patient is  on continuous home oxygen (2-3L). [facility] is oxygen supplier.  Patient also has a home nebulizer as well as a walker and a cane; however, patient indicates he does not use his walker or cane for mobility.       Home care services: none prior to admission. ? HH PT        Transportation: Patient indicates his daughter will provide transportation to his [facility] appointment.        Medications: Patient indicates he has been having difficulty obtaining Plavix and Pulmicort.  He indicates he was able to obtain these medications for a year; however, [employer] needing updated income and benefits statement so his yearly application can be renewed.  He indicates he has been unable to get to the Social Security office to get the needed documentation because he has difficulty waiting at the office and walking the distance to get into the building. This was discussed with Jonnie Finner, RN Case Manager who indicates patient may be able to provide [employer] with other documentation such as bank statements.  She indicates she will discuss this with him.  Also spoke with Belenda Cruise at the [facility] who indicates she will assist patient at his appointment; he will need to bring the last disability benefit letter that he has. Attempted to reach patient to inform; however, unable to get through to patient. Will need to discuss with patient when called for post-discharge follow-up phone call.          Arranged services:  Transitional Care Clinic appointment on 11/04/14 at 1415.      Services communicated to: Jonnie Finner, RN Case Manager     Patient  verbalizes understanding of Transitional Care Clinic services and aware of appointment time/location.

## 2014-10-30 NOTE — Progress Notes (Signed)
CARE MANAGEMENT NOTE 10/30/2014  Patient:  [name],[name]   Account Number:  [id number]  Date Initiated:  10/28/2014  Documentation initiated by:  MAYO,HENRIETTA  Subjective/Objective Assessment:   dx COPD exac; lives with sister    PCP  Flo Shankslugemiga Jegede with [facility]     Action/Plan:   Anticipated DC Date:  10/31/2014   Anticipated DC Plan:  HOME/SELF CARE      DC Planning Services  CM consult  Medication Assistance      Choice offered to / List presented to:             Status of service:  Completed, signed off Medicare Important Message given?  YES (If response is "NO", the following Medicare IM given date fields will be blank) Date Medicare IM given:  10/30/2014 Medicare IM given by:  [facility],Rodina Pinales Date Additional Medicare IM given:   Additional Medicare IM given by:    Discharge Disposition:  HOME/SELF CARE  Per UR Regulation:  Reviewed for med. necessity/level of care/duration of stay  If discussed at Long Length of Stay Meetings, dates discussed:    Comments:  10/30/2014 1600 NCM spoke to pt and states he lives alone. He does not want HH PT at this time. He has RW at home. Pt does want to have sleep study and CPAP at home. [facility] for Jan 28 at 8:00 pm. Center will mail out a package for pt to complete. Notified [facility] for CPAP for scheduled dc home tom. F[facility]ed [facility].  10/28/14 0954 Verdis PrimeHenrietta Mayo RN MSN BSN CCM Pt has been getting Pulmicort through [employer] pt assistance program, states he needs new application to complete and send in with his financial information. Provided application.  Pt also states he does not have Medicare B or D.  States pulmonoligist has just recently started billing for his office visits.  Provided pt with [facility] contact information, advised him to call to determine eligibility for LIS as well as to request assistance enrolling in B and D.

## 2014-10-30 NOTE — Progress Notes (Signed)
Inpatient Diabetes Program Recommendations  AACE/ADA: New Consensus Statement on Inpatient Glycemic Control (2013)  Target Ranges:  Prepandial:   less than 140 mg/dL      Peak postprandial:   less than 180 mg/dL (1-2 hours)      Critically ill patients:  140 - 180 mg/dL   Inpatient Diabetes Program Recommendations Insulin - Basal: While on solumedrol, please consider adding levemir/lahtus at 10 units daily or HS Insulin - Meal Coverage: While on solumedrol, please consider addition of novolog meal coverage of 3 units tidwc.  Thank you, Lenor CoffinAnn Kieffer Blatz, RN, CNS, Diabetes Coordinator [phone])

## 2014-10-30 NOTE — Plan of Care (Signed)
Problem: Phase I Progression Outcomes Goal: Hemodynamically stable Outcome: Progressing     

## 2014-10-30 NOTE — Progress Notes (Signed)
Patient cannot tolerate CPT at this time. Will continue to work with patient throughout the day with CPT either by chest vest or flutter. RT will continue to assist as needed.

## 2014-10-30 NOTE — Progress Notes (Signed)
[facility] PROGRESS NOTE  [name] VOZ:[id number] DOB: 07/02/1951 DOA: 10/27/2014 PCP: Jeanann LewandowskyJEGEDE, OLUGBEMIGA, MD  Assessment/Plan:  COPD exacerbation - Weaned to home O2 requirement of 3L with 98% O2 sat- on exam, good air movement with diffuse    expiratory wheeze -Continue Levaquin 500 mg daily (day 3) -Transitioned from 60mg  IV Solumedrol q 12 to 60mg  PO Prednisone daily -Continue DuoNeb and inhaler.  -PT recommends home health PT -Continue chest PT and Incentive spirometry -Admits to tobacco use in recent past- counseled on smoking cessation -Outpatient follow up with pulmonologist  for PFTs  Chronic Diastolic CHF -1+ BLE edema -2D echo 2014- EF 55-60%; grade 1 diastolic dysfunction -Repeat 2D echo 10/2014- EF55%; mild LVH, no wall motion abnormalities -Continue home Lasix 20 mg daily  DM -Hgb A1c 7.4, prior 6.8 -Newly diagnosed- no known history of DM, but patient is on chronic steroids at home and due to   hyperglycemia, he was placed on Metformin for a short period -Continue SSI -Placed Metformin 500 mg PO daily -Provided diabetes education and resources  Pain initiated by coughing -Pain along bilateral ribs that radiates to mid back, worsened by coughing -PRN Vicodin  Normocytic Anemia -Likely dilutional -Continue to monitor CBC  HTN -BP stable -Continue home Metoprolol 50 mg BID  CAD -Continue home regimen of Plavix 75 mg and ASA 650 mg daily  Obesity -BMI 30.1    DVT Prophylaxis:  SQ Heparin Code Status: Full Family Communication: No family at bedside Disposition Plan: Home when stable   Consultants:  None  Procedures: 2D echocardiogram:   Left ventricle: The cavity size was normal. Wall thickness was  increased in a pattern of mild LVH. The estimated ejection  fraction was 55%. Wall motion was normal; there were no regional  wall motion abnormalities.  -Aortic valve: Sclerosis without stenosis. There was  mild  regurgitation.   -Right ventricle: The cavity size was normal. Systolic function  was normal.  Antibiotics:  Levofloxacin 500mg  PO daily (day3)  HPI/Subjective: [name] is a  63 yo male with PMH of O2 dependednt COPD, CAD, CAD with prior stents, HTN, and CHF who presented with worsening shortness of breath and dyspnea on exertion for several weeks.   He had had associated cough and chills but no fever.  He received multiple breathing treatments at home without improvement.  Was recently treated by his Pulmonologist for a COPD exacerbation.  When EMS arrived patient was tachycardic, pale, and had 90% O2 sats.  He was placed on CPAP with much improvement in symptoms. In ED, patient was stable on CPAP, CXR without any acute process. Patient was admitted to SDU.  Patient has since improved significantly, and is now weaned to [place] O2.  Subsequently, he is transferred to the floor.    States he is improved today from yesterday.  Still endorses back/rip pain.  Has not ambulated much due to SOB, and states he couldn't tolerate Chest PT.     Objective: Filed Vitals:   10/30/14 1057  BP: 179/100  Pulse: 83  Temp:   Resp:     Intake/Output Summary (Last 24 hours) at 10/30/14 1137 Last data filed at 10/30/14 1001  Gross per 24 hour  Intake   1120 ml  Output   3150 ml  Net  -2030 ml   Filed Weights   10/28/14 0142 10/28/14 0345 10/29/14 0621  Weight: 97.7 kg (215 lb 6.2 oz) 97.7 kg (215 lb 6.2 oz) 96.253 kg (212 lb 3.2 oz)  Exam:  Gen: Alert and oriented overweight Caucasian male in NAD.  On 3L [place] HEENT: Normocephalic, atraumatic.  Pupils symmertrical.  Moist mucosa.   Chest: good air movement bilaterally, diffuse wheeze Cardiac: Regular rate and rhythm, S1-S2, no rubs murmurs or gallops  Abdomen: soft, non tender, non distended, +bowel sounds. No guarding or rigidity  Extremities: Symmetrical in appearance without cyanosis, 1+ edema  Neurological: Alert awake oriented  to time place and person.  Psychiatric: Appears normal.   Data Reviewed: Basic Metabolic Panel:  Recent Labs Lab 10/27/14 2315 10/27/14 2343 10/28/14 0324  NA 146 142 144  K 4.1 3.9 4.5  CL 101 100 99  CO2 37*  --  37*  GLUCOSE 140* 141* 241*  BUN 21 26* 22  CREATININE 0.70 1.00 0.61  CALCIUM 9.1  --  8.8   Liver Function Tests:  Recent Labs Lab 10/28/14 0324  AST 16  ALT 33  ALKPHOS 63  BILITOT <0.2*  PROT 6.0  ALBUMIN 3.4*   No results for input(s): LIPASE, AMYLASE in the last 168 hours. No results for input(s): AMMONIA in the last 168 hours. CBC:  Recent Labs Lab 10/27/14 2315 10/27/14 2343 10/28/14 0324  WBC 7.4  --  6.4  NEUTROABS 6.3  --   --   HGB 12.4* 13.9 11.8*  HCT 40.4 41.0 39.0  MCV 95.7  --  94.4  PLT 180  --  167   Cardiac Enzymes: No results for input(s): CKTOTAL, CKMB, CKMBINDEX, TROPONINI in the last 168 hours. BNP (last 3 results)  Recent Labs  12/15/13 2200 12/26/13 0203 10/27/14 2311  PROBNP 78.6 100.5 89.0   CBG:  Recent Labs Lab 10/29/14 0807 10/29/14 1154 10/29/14 1718 10/29/14 2105 10/30/14 0827  GLUCAP 162* 274* 263* 277* 236*    Recent Results (from the past 240 hour(s))  Culture, blood (routine x 2)     Status: None (Preliminary result)   Collection Time: 10/27/14 11:15 PM  Result Value Ref Range Status   Specimen Description BLOOD RIGHT ARM  Final   Special Requests BOTTLES DRAWN AEROBIC AND ANAEROBIC 5CC EACH  Final   Culture  Setup Time   Final    10/28/2014 02:51 [facility]    Culture   Final           BLOOD CULTURE RECEIVED NO GROWTH TO DATE CULTURE WILL BE HELD FOR 5 DAYS BEFORE ISSUING A FINAL NEGATIVE REPORT Performed at [facility]    Report Status PENDING  Incomplete  Culture, blood (routine x 2)     Status: None (Preliminary result)   Collection Time: 10/27/14 11:24 PM  Result Value Ref Range Status   Specimen Description BLOOD RIGHT FOREARM  Final   Special  Requests BOTTLES DRAWN AEROBIC AND ANAEROBIC 10CC EACH  Final   Culture  Setup Time   Final    10/28/2014 02:52 Performed at [facility]    Culture   Final           BLOOD CULTURE RECEIVED NO GROWTH TO DATE CULTURE WILL BE HELD FOR 5 DAYS BEFORE ISSUING A FINAL NEGATIVE REPORT Performed at [facility]    Report Status PENDING  Incomplete  MRSA PCR Screening     Status: None   Collection Time: 10/28/14  1:47 AM  Result Value Ref Range Status   MRSA by PCR NEGATIVE NEGATIVE Final    Comment:        The GeneXpert MRSA Assay (FDA  approved for NASAL specimens only), is one component of a comprehensive MRSA colonization surveillance program. It is not intended to diagnose MRSA infection nor to guide or monitor treatment for MRSA infections.   Culture, sputum-assessment     Status: None   Collection Time: 10/28/14  8:22 AM  Result Value Ref Range Status   Specimen Description SPUTUM  Final   Special Requests NONE  Final   Sputum evaluation   Final    MICROSCOPIC FINDINGS SUGGEST THAT THIS SPECIMEN IS NOT REPRESENTATIVE OF LOWER RESPIRATORY SECRETIONS. PLEASE RECOLLECT. Gram Stain Report Called to,Read Back By and Verified With: TATA CARBONE,RN AT 0920 10/28/14 BY K BARR    Report Status 10/28/2014 FINAL  Final     Studies: No results found.  Scheduled Meds: . aspirin  650 mg Oral q morning - 10a  . budesonide  0.25 mg Nebulization BID  . clopidogrel  75 mg Oral Daily  . enoxaparin (LOVENOX) injection  40 mg Subcutaneous Q24H  . furosemide  20 mg Oral q morning - 10a  . guaiFENesin  600 mg Oral BID  . insulin aspart  0-9 Units Subcutaneous TID WC  . ipratropium-albuterol  3 mL Nebulization Q6H  . levofloxacin  500 mg Oral Daily  . metoprolol  50 mg Oral BID  . [START ON 10/31/2014] predniSONE  60 mg Oral Q breakfast  . sodium chloride  3 mL Intravenous Q12H   Continuous Infusions:   Active Problems:   Essential hypertension   Coronary  atherosclerosis   Acute on chronic respiratory failure with hypoxia   Acute exacerbation of chronic obstructive pulmonary disease (COPD)   Bilateral lower extremity edema   Acute hyperglycemia   COPD exacerbation   Respiratory distress    Time spent: 40    Illa Level [facility]  [facility] Pager [phone]. If 7PM-7AM, please contact night-coverage at [email or web address], password [facility] 10/30/2014, 11:37 AM  LOS: 3 days

## 2014-10-31 DIAGNOSIS — E119 Type 2 diabetes mellitus without complications: Secondary | ICD-10-CM

## 2014-10-31 DIAGNOSIS — G4733 Obstructive sleep apnea (adult) (pediatric): Secondary | ICD-10-CM | POA: Diagnosis present

## 2014-10-31 LAB — CBC
HCT: 38 % — ABNORMAL LOW (ref 39.0–52.0)
HEMOGLOBIN: 11.9 g/dL — AB (ref 13.0–17.0)
MCH: 29.2 pg (ref 26.0–34.0)
MCHC: 31.3 g/dL (ref 30.0–36.0)
MCV: 93.4 fL (ref 78.0–100.0)
Platelets: 158 10*3/uL (ref 150–400)
RBC: 4.07 MIL/uL — ABNORMAL LOW (ref 4.22–5.81)
RDW: 15.1 % (ref 11.5–15.5)
WBC: 6.8 10*3/uL (ref 4.0–10.5)

## 2014-10-31 LAB — BASIC METABOLIC PANEL
ANION GAP: 12 (ref 5–15)
BUN: 17 mg/dL (ref 6–23)
CHLORIDE: 97 meq/L (ref 96–112)
CO2: 34 mEq/L — ABNORMAL HIGH (ref 19–32)
CREATININE: 0.55 mg/dL (ref 0.50–1.35)
Calcium: 8.8 mg/dL (ref 8.4–10.5)
GFR calc Af Amer: >90 mL/min (ref >90)
GFR calc non Af Amer: >90 mL/min (ref >90)
Glucose, Bld: 286 mg/dL — ABNORMAL HIGH (ref 70–99)
Potassium: 4.4 mEq/L (ref 3.7–5.3)
Sodium: 143 mEq/L (ref 137–147)

## 2014-10-31 LAB — GLUCOSE, CAPILLARY
Glucose-Capillary: 282 mg/dL — ABNORMAL HIGH (ref 70–99)
Glucose-Capillary: 282 mg/dL — ABNORMAL HIGH (ref 70–99)

## 2014-10-31 MED ORDER — ACETAMINOPHEN-CODEINE #3 300-30 MG PO TABS: 1.0000 | ORAL_TABLET | ORAL | Status: DC | PRN

## 2014-10-31 MED ORDER — LEVOFLOXACIN 500 MG PO TABS: 500.0000 mg | ORAL_TABLET | Freq: Every day | ORAL | Status: DC

## 2014-10-31 MED ORDER — TRAMADOL HCL 50 MG PO TABS: 50.0000 mg | ORAL_TABLET | Freq: Three times a day (TID) | ORAL | Status: DC | PRN

## 2014-10-31 MED ORDER — MOMETASONE FURO-FORMOTEROL FUM 100-5 MCG/ACT IN AERO: 2.0000 | INHALATION_SPRAY | Freq: Two times a day (BID) | RESPIRATORY_TRACT | Status: DC

## 2014-10-31 MED ORDER — PREDNISONE 20 MG PO TABS: ORAL_TABLET | ORAL | Status: DC

## 2014-10-31 MED ORDER — PREDNISONE 20 MG PO TABS: 20.0000 mg | ORAL_TABLET | Freq: Every day | ORAL | Status: DC

## 2014-10-31 NOTE — Progress Notes (Signed)
Physical Therapy Treatment Patient Details Name: [name] MRN: [id number] DOB: 08/15/1951 Today's Date: 10/31/2014    History of Present Illness [name] is a 63 y.o. male with Past medical history of COPD, coronary artery disease, hypertension, on chronic oxygen, GERD. The patient is presenting with complaints of shortness of breath which has been progressively worsening since last 2 days ongoing since last few weeks. He also had cough with chills. He was recently treated for a COPD exacerbation as an outpatient.    PT Comments    Pt anticipating d/c home this evening. He was able to maintain O2 sats fairly well throughout gait training on 3L/min supplemental O2. Pt was educated on home safety, and recommended use of RW for a short time for energy conservation. Will continue to follow.   Follow Up Recommendations  Home health PT;Supervision/Assistance - 24 hour     Equipment Recommendations  None recommended by PT    Recommendations for Other Services       Precautions / Restrictions Precautions Precautions: Fall Restrictions Weight Bearing Restrictions: No    Mobility  Bed Mobility               General bed mobility comments: Pt sitting up in recliner chair upon PT arrival.   Transfers Overall transfer level: Needs assistance Equipment used: Rolling walker (2 wheeled) Transfers: Sit to/from Stand Sit to Stand: Supervision         General transfer comment: Pt did not require any assistance to power-up to full standing position. He was able to maintain balance well.   Ambulation/Gait Ambulation/Gait assistance: Min guard Ambulation Distance (Feet): 60 Feet Assistive device: Rolling walker (2 wheeled) Gait Pattern/deviations: Step-through pattern;Decreased stride length;Trunk flexed Gait velocity: Decreased Gait velocity interpretation: Below normal speed for age/gender General Gait Details: Pt nervous about ambulating prior to d/c. Encouraged pt  to participate so he would know what to expect when he returned home this evening. Pt was able to ambulate in room ~60 feet on 3L/min supplemental O2. Sats remained 89-92% throughout.    Stairs            Wheelchair Mobility    Modified Rankin (Stroke Patients Only)       Balance Overall balance assessment: Needs assistance Sitting-balance support: Feet supported;No upper extremity supported Sitting balance-Leahy Scale: Fair     Standing balance support: Bilateral upper extremity supported;During functional activity Standing balance-Leahy Scale: Fair Standing balance comment: Feel pt is able to maintain standing balance without UE support.                    Cognition Arousal/Alertness: Awake/alert Behavior During Therapy: WFL for tasks assessed/performed Overall Cognitive Status: Within Functional Limits for tasks assessed                      Exercises      General Comments        Pertinent Vitals/Pain Pain Assessment: No/denies pain    Home Living                      Prior Function            PT Goals (current goals can now be found in the care plan section) Acute Rehab PT Goals Patient Stated Goal: To return home independently.  PT Goal Formulation: With patient Time For Goal Achievement: 11/12/14 Potential to Achieve Goals: Good Progress towards PT goals: Progressing toward goals  Frequency  Min 3X/week    PT Plan Current plan remains appropriate    Co-evaluation             End of Session Equipment Utilized During Treatment: Gait belt;Oxygen Activity Tolerance: Patient limited by fatigue Patient left: in chair;with call bell/phone within reach     Time: 1359-1414 PT Time Calculation (min) (ACUTE ONLY): 15 min  Charges:  $Gait Training: 8-22 mins                    G Codes:      [name] 10/31/2014, 3:03 PM   [name], PT, DPT Acute Rehabilitation Services Pager: [phone]

## 2014-10-31 NOTE — Discharge Summary (Signed)
Physician Discharge Summary  Chris ProphetUlysses G [name] OZH:[id number]:[id number] DOB: 09/21/1951 DOA: 10/27/2014  PCP: Jeanann LewandowskyJEGEDE, OLUGBEMIGA, MD  Admit date: 10/27/2014 Discharge date: 10/31/2014  Time spent: 60 minutes  Recommendations for Outpatient Follow-up:  1. [facility] for follow up on hospitalization, CBC to reassess hemoglobin, management of Diabetes, COPD, and CHF 2. [facility] for [facility]. 3. Follow up with [place] pul[facility]gy for repeat Pulmonary Function Test 4. Discharge home  Discharge Diagnoses:   Active Problems:   Essential hypertension   Coronary atherosclerosis   Acute on chronic respiratory failure with hypoxia   Acute exacerbation of chronic obstructive pulmonary disease (COPD)   Bilateral lower extremity edema   Acute hyperglycemia   COPD exacerbation   Respiratory distress   Diabetes   OSA (obstructive sleep apnea)   Discharge Condition: Stable  Diet recommendation: Heart Healthy/ Carb modified  Filed Weights   10/28/14 0142 10/28/14 0345 10/29/14 [id number]  Weight: 97.7 kg (215 lb 6.2 oz) 97.7 kg (215 lb 6.2 oz) 96.253 kg (212 lb 3.2 oz)    History of present illness:  COPD exacerbation -Patient presented with shortness of breath and cough and was found to be in acute respiratory failure secondary to  exacerbation of COPD.       Patient is supported with BiPAP and placed on IV steroids, IV antibiotics, nebs, inhalers, and pulmonary hygiene. Patient started improving with   less O2 demands and [facility] and transitioned to oral prednisone.  Additionally, patient is placed on  On discharge, patient is on 3L   [place] with 98% O2 sats.  On exam, he has good air movement with diffuse wheeze.  Physical therapy recommends that patient has home   health  PT.  -On discharge, take Levofloxacin 500mg  PO daily for two days.   -Pulmicort inhaler is discontinued.  Take Dulera inhaler 2 puffs daily.   -Continue albuterol  inhaler, albuterol neb, and Brovana. -Continue oral prednisone.  Take 60mg  for 5 days.  Then take 30mg  daily for one month. -Patient is counseled on smoking cessation  -Outpatient follow up with pulmonologist  for PFTs  Chronic Diastolic CHF -Mild BLE edema -2D echo 2014- EF 55-60%; grade 1 diastolic dysfunction -Repeat 2D echo 10/2014- EF55%; mild LVH, no wall motion abnormalities -Continue home Lasix 20 mg daily  DM, Newly Diagnosed -Patient with no prior history of diabetes. Hgb A1c of 7.4, with prior A1x of 6.8.  He is on chronic steroids at home for COPD, which is thought to contribute to his newly diagnosed diabetes. Was placed on SSI during hospitalization.   Patient is provided with education and counseling about diabetes. -On discharge, placed Metformin 500 mg PO BID -Follow up with PCP for management of diabetes  Pain initiated by coughing -Pain along bilateral ribs that radiates to mid back, worsened by coughing.   -Continue Tylenol #3 and tramadol as needed for pain  OSA -Patient with increases O2 requirements at night, requiring use of CPAP during stay -Continue CPAP at home -[facility] with [facility] for sleep [facility]  Normocytic Anemia -Hgb stable on discharge with no history of bleeding. -Follow up CBC with PCP  HTN -BP stable on discharge -Continue home Metoprolol 50 mg BID  CAD -Continue home regimen of Plavix 75 mg and ASA 650 mg daily  GERD -continue home regimen of Protonix 40mg  daily  Obesity -BMI 30.1 -Counseled on proper dieting and exercise  Hospital Course:  [name] is a  63 yo male with PMH of O2 dependednt COPD, CAD, CAD with prior stents, HTN, and CHF who presented with worsening shortness of breath and dyspnea on exertion for several weeks.   He had had associated cough and chills but no fever.  He received multiple breathing treatments at home without improvement.  Was recently treated by his Pulmonologist for a COPD exacerbation.  When  EMS arrived patient was tachycardic, pale, and had 90% O2 sats.  He was placed on CPAP with much improvement in symptoms. In ED, patient was stable on CPAP, CXR without any acute process. Patient was admitted to SDU.  Patient has since improved significantly, and is now weaned to [place] O2.  Subsequently, he is transferred to the floor.    Procedures:  2  echocardiogram: Left ventricle: The cavity size was normal. Wall thickness was increased in a pattern of mild LVH. The estimated ejection fraction was 55%. Wall motion was normal; there were no regional wall motion abnormalities. - Aortic valve: Sclerosis without stenosis. There was mild regurgitation. - Right ventricle: The cavity size was normal. Systolic function was normal.   Consultations:  None  Discharge Exam: Filed Vitals:   10/31/14 0949  BP: 141/78  Pulse:   Temp:   Resp:      Exam General: Alert and oriented overweight Caucasian male in NAD.  On [place] Cardiovascular: Regular rate and rhythm.  No murmurs, rubs, or gallops. Respiratory: Good air movement bilaterally.  Diffuse wheeze Abdomen: Soft nontender bowel sounds present. No guarding or rigidity.  Musculoskeletal: 1+ edema Psychiatric: Appears normal.  Neurologic: Alert awake oriented to time place and person.    Discharge Instructions       Discharge Instructions    Ambulatory referral to Nutrition and Diabetic Education    Complete by:  As directed   Pt consult for OP education. [facility] Hyman HopesJegede. New onset DM on Metformin            Medication List    STOP taking these medications        azithromycin 250 MG tablet  Commonly known as:  ZITHROMAX     budesonide 0.25 MG/2ML nebulizer solution  Commonly known as:  PULMICORT      TAKE these medications        acetaminophen-codeine 300-30 MG per tablet  Commonly known as:  TYLENOL #3  Take 1 tablet by mouth every 4 (four) hours as needed for moderate pain.      albuterol (2.5 MG/3ML) 0.083% nebulizer solution  Commonly known as:  PROVENTIL  INHALE CONTENTS OF ONE VIAL IN NEBULIZER 4 TIMES DAILY     albuterol 108 (90 BASE) MCG/ACT inhaler  Commonly known as:  PROVENTIL HFA;VENTOLIN HFA  Inhale 2 puffs into the lungs every 6 (six) hours as needed for wheezing or shortness of breath.     albuterol (2.5 MG/3ML) 0.083% nebulizer solution  Commonly known as:  PROVENTIL  Take 3 mLs (2.5 mg total) by nebulization every 6 (six) hours as needed for wheezing or shortness of breath. Dx 496     arformoterol 15 MCG/2ML Nebu  Commonly known as:  BROVANA  Take 2 mLs (15 mcg total) by nebulization 2 (two) times daily.     aspirin 325 MG tablet  Take 650 mg by mouth every morning.     clopidogrel 75 MG tablet  Commonly known as:  PLAVIX  Take 1 tablet (75 mg total) by mouth daily.  fluticasone 50 MCG/ACT nasal spray  Commonly known as:  FLONASE  Place 2 sprays into both nostrils 2 (two) times daily.     furosemide 20 MG tablet  Commonly known as:  LASIX  Take 1 tablet (20 mg total) by mouth every morning.     furosemide 20 MG tablet  Commonly known as:  LASIX  TAKE 1 TABLET (20 MG TOTAL) BY MOUTH DAILY.     ipratropium 0.02 % nebulizer solution  Commonly known as:  ATROVENT  INHALE CONTENTS OF ONE VIAL IN NEBULIZER 4 TIMES DAILY     levofloxacin 500 MG tablet  Commonly known as:  LEVAQUIN  Take 1 tablet (500 mg total) by mouth daily. Take one tablet daily for a total of 2 days     metFORMIN 500 MG tablet  Commonly known as:  GLUCOPHAGE  Take 1 tablet (500 mg total) by mouth 2 (two) times daily with a meal.     metoprolol 50 MG tablet  Commonly known as:  LOPRESSOR  TAKE 1 TABLET (50 MG TOTAL) BY MOUTH 2 (TWO) TIMES DAILY.     mometasone-formoterol 100-5 MCG/ACT Aero  Commonly known as:  DULERA  Inhale 2 puffs into the lungs 2 (two) times daily.     nystatin 100000 UNIT/ML suspension  Commonly known as:  MYCOSTATIN  Take 5 mLs  (500,000 Units total) by mouth 4 (four) times daily.     pantoprazole 40 MG tablet  Commonly known as:  PROTONIX  Take 1 tablet (40 mg total) by mouth daily at 12 noon.     predniSONE 20 MG tablet  Commonly known as:  DELTASONE  Take 3 pills, 60 mg total, for 5 days.  Then take one and half tablet (30 mg) for 30 days.     terbutaline 2.5 MG tablet  Commonly known as:  BRETHINE  Take 1 tablet (2.5 mg total) by mouth every 6 (six) hours.     traMADol 50 MG tablet  Commonly known as:  ULTRAM  Take 1 tablet (50 mg total) by mouth every 8 (eight) hours as needed (for breakthrough pain).     traZODone 50 MG tablet  Commonly known as:  DESYREL  Take 1 tablet (50 mg total) by mouth at bedtime as needed for sleep.       Allergies  Allergen Reactions  . Lipitor [Atorvastatin Calcium] Other (See Comments)    Muscle aches   Follow-up Information    Follow up with [facility]    .   Why:  Seen in [facility]. Appointment on November 05, 2015 at 2:15 pm. bring medications and discharge paperwork   Contact information:   [address] [place] [address] [phone]      Follow up with [facility].   Why:  appointment Jan 16, 2015 at 8:00 pm. Sleep Center will mail a information package.   Contact information:   [address] [place] [address] [phone]      Follow up with Jeanann Lewandowsky, MD.   Specialty:  Internal Medicine   Contact information:   [address] [place] [address] [phone]        The results of significant diagnostics from this hospitalization (including imaging, microbiology, ancillary and laboratory) are listed below for reference.    Significant Diagnostic Studies: Dg Chest Port 1 View  10/27/2014   CLINICAL DATA:  Chest pain, cough and shortness  of breath.  EXAM: PORTABLE CHEST - 1 VIEW  COMPARISON:  PA and lateral chest  01/04/2014. Single view of the chest 02/07/2013.  FINDINGS: The chest is hyperexpanded with attenuation of the pulmonary vasculature but the lungs are clear. Heart size is normal. No pneumothorax or pleural effusion.  IMPRESSION: No acute disease.  Emphysema.   Electronically Signed   By: [name] M.D.   On: 10/27/2014 23:21    Microbiology: Recent Results (from the past 240 hour(s))  Culture, blood (routine x 2)     Status: None (Preliminary result)   Collection Time: 10/27/14 11:15 PM  Result Value Ref Range Status   Specimen Description BLOOD RIGHT ARM  Final   Special Requests BOTTLES DRAWN AEROBIC AND ANAEROBIC 5CC EACH  Final   Culture  Setup Time   Final    10/28/2014 02:51 Performed at [facility]    Culture   Final           BLOOD CULTURE RECEIVED NO GROWTH TO DATE CULTURE WILL BE HELD FOR 5 DAYS BEFORE ISSUING A FINAL NEGATIVE REPORT Performed at [facility]    Report Status PENDING  Incomplete  Culture, blood (routine x 2)     Status: None (Preliminary result)   Collection Time: 10/27/14 11:24 PM  Result Value Ref Range Status   Specimen Description BLOOD RIGHT FOREARM  Final   Special Requests BOTTLES DRAWN AEROBIC AND ANAEROBIC 10CC EACH  Final   Culture  Setup Time   Final    10/28/2014 02:52 Performed at [facility]    Culture   Final           BLOOD CULTURE RECEIVED NO GROWTH TO DATE CULTURE WILL BE HELD FOR 5 DAYS BEFORE ISSUING A FINAL NEGATIVE REPORT Performed at [facility]    Report Status PENDING  Incomplete  MRSA PCR Screening     Status: None   Collection Time: 10/28/14  1:47 AM  Result Value Ref Range Status   MRSA by PCR NEGATIVE NEGATIVE Final    Comment:        The GeneXpert MRSA Assay (FDA approved for NASAL specimens only), is one component of a comprehensive MRSA colonization surveillance program. It is not intended to diagnose MRSA infection nor to guide or monitor treatment for MRSA  infections.   Culture, sputum-assessment     Status: None   Collection Time: 10/28/14  8:22 AM  Result Value Ref Range Status   Specimen Description SPUTUM  Final   Special Requests NONE  Final   Sputum evaluation   Final    MICROSCOPIC FINDINGS SUGGEST THAT THIS SPECIMEN IS NOT REPRESENTATIVE OF LOWER RESPIRATORY SECRETIONS. PLEASE RECOLLECT. Gram Stain Report Called to,Read Back By and Verified With: TATA CARBONE,RN AT 0920 10/28/14 BY K BARR    Report Status 10/28/2014 FINAL  Final     Labs: Basic Metabolic Panel:  Recent Labs Lab 10/27/14 2315 10/27/14 2343 10/28/14 0324 10/31/14 0750  NA 146 142 144 143  K 4.1 3.9 4.5 4.4  CL 101 100 99 97  CO2 37*  --  37* 34*  GLUCOSE 140* 141* 241* 286*  BUN 21 26* 22 17  CREATININE 0.70 1.00 0.61 0.55  CALCIUM 9.1  --  8.8 8.8   Liver Function Tests:  Recent Labs Lab 10/28/14 0324  AST 16  ALT 33  ALKPHOS 63  BILITOT <0.2*  PROT 6.0  ALBUMIN 3.4*   No results for input(s): LIPASE, AMYLASE  in the last 168 hours. No results for input(s): AMMONIA in the last 168 hours. CBC:  Recent Labs Lab 10/27/14 2315 10/27/14 2343 10/28/14 0324 10/31/14 0750  WBC 7.4  --  6.4 6.8  NEUTROABS 6.3  --   --   --   HGB 12.4* 13.9 11.8* 11.9*  HCT 40.4 41.0 39.0 38.0*  MCV 95.7  --  94.4 93.4  PLT 180  --  167 158   Cardiac Enzymes: No results for input(s): CKTOTAL, CKMB, CKMBINDEX, TROPONINI in the last 168 hours. BNP: BNP (last 3 results)  Recent Labs  12/15/13 2200 12/26/13 0203 10/27/14 2311  PROBNP 78.6 100.5 89.0   CBG:  Recent Labs Lab 10/30/14 0827 10/30/14 1241 10/30/14 1814 10/30/14 2211 10/31/14 0805  GLUCAP 236* 222* 137* 129* 282*       Signed:  Illa Level PA-C  [facility] 10/31/2014, 10:05 AM

## 2014-10-31 NOTE — Progress Notes (Signed)
NURSING PROGRESS NOTE  Haig ProphetUlysses G Chaplin [id number] Discharge Data: 10/31/2014 2:23 PM Attending Provider: Rhetta MuraJai-Gurmukh Samtani, MD WUJ:[id number]:JEGEDE, Keane ScrapeLUGBEMIGA, MD   Haig ProphetUlysses G Espinal to be D/C'd Home per MD order.    All IV's will be discontinued and monitored for bleeding.  All belongings will be returned to patient for patient to take home.  Last Documented Vital Signs:  Blood pressure 141/78, pulse 86, temperature 97.7 F (36.5 C), temperature source Oral, resp. rate 18, height 5\' 11"  (1.803 m), weight 96.253 kg (212 lb 3.2 oz), SpO2 98 %.  Madelin RearLonnie Alyxander Kollmann, MSN, RN, [employer]

## 2014-10-31 NOTE — Progress Notes (Signed)
Inpatient Diabetes Program Recommendations  AACE/ADA: New Consensus Statement on Inpatient Glycemic Control (2013)  Target Ranges:  Prepandial:   less than 140 mg/dL      Peak postprandial:   less than 180 mg/dL (1-2 hours)      Critically ill patients:  140 - 180 mg/dL   Inpatient Diabetes Program Recommendations Insulin - Basal: [id number] Insulin - Meal Coverage: Prednisone at 60 mg (or any dose over 10 mg) tends to elevate the post-prandial glucose levels. Please add 3 units meal coverage while on any dose Prednisone greater than 20 mg/dy. Thank you, Lenor CoffinAnn Amillia Biffle, RN, CNS, Diabetes Coordinator [phone])

## 2014-11-01 ENCOUNTER — Telehealth: Payer: Self-pay

## 2014-11-01 NOTE — Telephone Encounter (Signed)
Spoke with Natalia LeatherwoodKatherine in [facility] who indicted [employer] states income documentation not needed for application for [employer]ulera.  Patient will need to complete a PASS application after [facility] appointment on 11/04/14.  Pharmacy has samples of Dulera that patient can use until application is processed. Patient updated, and he verbalized understanding.

## 2014-11-01 NOTE — Telephone Encounter (Signed)
[facility] Post-discharge Follow-Up Phone Call: Date of Discharge: 10/31/14 Principal Discharge Diagnosis: COPD exacerbation Reason for Chronic Case Management: Patient has had 4 hospitalizations in the last year.  Patient has history of COPD, essential hypertension, coronary artery disease, new onset diabetes mellitus. Call Completed: Yes-spoke with patient for 15 minutes.   Interpreter Needed: No              Language/Dialect:  English   Please check all that apply: X  Patient is knowledgeable of his/her condition(s) and/or treatment. ? Family and/or caregiver is knowledgeable of patient's condition(s) and/or treatment. X  Patient is caring for self at home. ? Patient is receiving home health services. If so, name of agency.    Medication Reconciliation:  X  Medication list reviewed with patient. X  Patient able to get medications; however, [facility].  Patient indicates he currently has a Dulera inhaler but [facility] not be [facility] his inhaler runs out.  Will speak with Pharmacy staff about this medication and if any assistance available for patient to continue to get it in the future.   Activities of Daily Living:  X  Independent ? Needs assist (describe) ? Total Care (describe)   Community resources in place for patient:   X  None-Patient indicates he was unable to afford CPAP as he was told he would have to pay $500/month until he has a sleep study. Patient has a sleep study scheduled on 01/16/15.  ? Home Health  ? Assisted Living ? Hospice ? Support Group          Patient Education: Patient aware of [facility] appointment on 11/04/14 at 1415. Patient has transportation to appointment.

## 2014-11-03 ENCOUNTER — Telehealth: Payer: Self-pay | Admitting: Surgery

## 2014-11-03 LAB — CULTURE, BLOOD (ROUTINE X 2)
Culture: NO GROWTH
Culture: NO GROWTH

## 2014-11-03 NOTE — Telephone Encounter (Deleted)
dkk

## 2014-11-04 ENCOUNTER — Ambulatory Visit: Payer: Medicare Other | Attending: Internal Medicine

## 2014-11-04 ENCOUNTER — Ambulatory Visit: Payer: Medicare Other | Attending: Internal Medicine | Admitting: Internal Medicine

## 2014-11-04 VITALS — BP 129/79 | HR 116 | Temp 98.2°F | Resp 20

## 2014-11-04 DIAGNOSIS — I10 Essential (primary) hypertension: Secondary | ICD-10-CM

## 2014-11-04 DIAGNOSIS — F17200 Nicotine dependence, unspecified, uncomplicated: Secondary | ICD-10-CM

## 2014-11-04 DIAGNOSIS — G4733 Obstructive sleep apnea (adult) (pediatric): Secondary | ICD-10-CM

## 2014-11-04 DIAGNOSIS — R600 Localized edema: Secondary | ICD-10-CM

## 2014-11-04 DIAGNOSIS — I2510 Atherosclerotic heart disease of native coronary artery without angina pectoris: Secondary | ICD-10-CM

## 2014-11-04 MED ORDER — CLOPIDOGREL BISULFATE 75 MG PO TABS: 75.0000 mg | ORAL_TABLET | Freq: Every day | ORAL | Status: AC

## 2014-11-04 MED ORDER — ALBUTEROL SULFATE (2.5 MG/3ML) 0.083% IN NEBU: 2.5000 mg | INHALATION_SOLUTION | Freq: Four times a day (QID) | RESPIRATORY_TRACT | Status: AC | PRN

## 2014-11-04 MED ORDER — METOPROLOL TARTRATE 50 MG PO TABS: 50.0000 mg | ORAL_TABLET | Freq: Two times a day (BID) | ORAL | Status: DC

## 2014-11-04 MED ORDER — CLOPIDOGREL BISULFATE 75 MG PO TABS: 75.0000 mg | ORAL_TABLET | Freq: Every day | ORAL | Status: DC

## 2014-11-04 MED ORDER — IPRATROPIUM BROMIDE 0.02 % IN SOLN: 0.2500 mg | Freq: Four times a day (QID) | RESPIRATORY_TRACT | Status: DC | PRN

## 2014-11-04 MED ORDER — FUROSEMIDE 20 MG PO TABS
40.0000 mg | ORAL_TABLET | Freq: Every day | ORAL | Status: DC
Start: 2014-11-04 — End: 2015-05-19

## 2014-11-04 MED ORDER — ALBUTEROL SULFATE HFA 108 (90 BASE) MCG/ACT IN AERS: 2.0000 | INHALATION_SPRAY | RESPIRATORY_TRACT | Status: DC | PRN

## 2014-11-04 MED ORDER — ALBUTEROL SULFATE (2.5 MG/3ML) 0.083% IN NEBU: 2.5000 mg | INHALATION_SOLUTION | Freq: Four times a day (QID) | RESPIRATORY_TRACT | Status: DC | PRN

## 2014-11-04 MED ORDER — METFORMIN HCL 500 MG PO TABS: 500.0000 mg | ORAL_TABLET | Freq: Two times a day (BID) | ORAL | Status: DC

## 2014-11-04 MED ORDER — ASPIRIN 325 MG PO TABS: 325.0000 mg | ORAL_TABLET | Freq: Every morning | ORAL | Status: DC

## 2014-11-04 MED ORDER — MOMETASONE FURO-FORMOTEROL FUM 100-5 MCG/ACT IN AERO: 2.0000 | INHALATION_SPRAY | Freq: Two times a day (BID) | RESPIRATORY_TRACT | Status: DC

## 2014-11-04 MED ORDER — PREDNISONE 10 MG PO TABS: ORAL_TABLET | ORAL | Status: DC

## 2014-11-04 MED ORDER — PANTOPRAZOLE SODIUM 40 MG PO TBEC: 40.0000 mg | DELAYED_RELEASE_TABLET | Freq: Every day | ORAL | Status: DC

## 2014-11-04 MED ORDER — TERBUTALINE SULFATE 2.5 MG PO TABS: 2.5000 mg | ORAL_TABLET | Freq: Four times a day (QID) | ORAL | Status: DC

## 2014-11-04 MED ORDER — TRAMADOL HCL 50 MG PO TABS: 50.0000 mg | ORAL_TABLET | Freq: Four times a day (QID) | ORAL | Status: DC | PRN

## 2014-11-04 MED ORDER — FLUTICASONE PROPIONATE 50 MCG/ACT NA SUSP: 2.0000 | Freq: Two times a day (BID) | NASAL | Status: AC

## 2014-11-04 MED ORDER — FLUTICASONE PROPIONATE 50 MCG/ACT NA SUSP: 2.0000 | Freq: Two times a day (BID) | NASAL | Status: DC

## 2014-11-04 MED ORDER — POTASSIUM CHLORIDE CRYS ER 20 MEQ PO TBCR: 20.0000 meq | EXTENDED_RELEASE_TABLET | Freq: Every day | ORAL | Status: AC

## 2014-11-04 MED ORDER — IPRATROPIUM BROMIDE 0.02 % IN SOLN: 0.5000 mg | RESPIRATORY_TRACT | Status: DC | PRN

## 2014-11-04 MED ORDER — ARFORMOTEROL TARTRATE 15 MCG/2ML IN NEBU: 15.0000 ug | INHALATION_SOLUTION | Freq: Two times a day (BID) | RESPIRATORY_TRACT | Status: DC

## 2014-11-04 MED ORDER — TRAZODONE HCL 50 MG PO TABS: 50.0000 mg | ORAL_TABLET | Freq: Every evening | ORAL | Status: DC | PRN

## 2014-11-04 NOTE — Progress Notes (Unsigned)
Patient ID: [name], male   DOB: 10/05/1951, 63 y.o.   MRN: [id number]                                     Transitional Care Clinic   [facility] Discharge Acute Issues Care Follow Up                                                                        Patient Demographics  [name], is a 63 y.o. male  DOB 08/19/1951  MRN [id number].  Primary MD  Jeanann LewandowskyJEGEDE, OLUGBEMIGA, MD   Reason for TCC follow Up [facility]- Post Hospital Discharge.   Past Medical History  Diagnosis Date  . COPD (chronic obstructive pulmonary disease)   . CAD (coronary artery disease)   . Hypertension   . Hyperlipidemia   . Osteomyelitis   . Myocardial infarction     "I've had a couple since 2006" (12/26/2013)  . Anginal pain   . Asthma   . Pneumonia     "have had it several times; maybe now too": (12/26/2013)  . Shortness of breath     "all the time" (12/26/2013)  . On home oxygen therapy     "2-3L; 24/7" (12/26/2013)  . GERD (gastroesophageal reflux disease)   . Daily headache   . Stroke     "they say I've had a couple" denies residual on 12/26/2013  . Arthritis     "hands; feet" (12/26/2013)  . Osteomyelitis hip     "right"  . Chronic lower back pain     Past Surgical History  Procedure Laterality Date  . Appendectomy  1963  . Cardiac catheterization  ? 2009; ?2012  . Coronary angioplasty with stent placement  2006; ?2008; ?2010    "2 + 2 + 1"       Recent HPI and Hospital Course [name] is a 63 yo male with PMH of O2 and prednisone dependent COPD, CAD, CAD with prior stents, HTN, and Chronic Diastolic CHF who presents today for a post hospital discharge follow-up visit. Patient has a history of oxygen dependent COPD, and was admitted on11/8/15 for acute on chronic respiratory failure from a COPD exacerbation. He was provided with as needed BiPAP, IV steroids, nebulized bronchodilators with significant improvement, and subsequently discharged on 10/31/14.Since discharge, he has been doing  fairly well,his breathing is close to baseline however he continues to have bilateral axillary area, back pain when he coughs. He claims he has not smoked a cigarette for approximately 3-4 weeks now. He continues to have chronic exertional dyspnea that is essentially unchanged. He claims that his swelling in his legs are essentially the same.  Post Hospital Acute Care Issue to be followed in the Clinic 1. Optimization of COPD regimen   Subjective:   [name] today hasis close to his usual baseline in terms of shortness of breath. His only complaint is ongoing bilateral infra-axillary area chest pain particularly when coughing.  Assessment & Plan    Patient Active Problem List   Diagnosis Date Noted  . Diabetes 10/31/2014  . OSA (obstructive sleep apnea) 10/31/2014  . COPD exacerbation   .  Respiratory distress   . Acute exacerbation of chronic obstructive pulmonary disease (COPD) 10/28/2014  . Bilateral lower extremity edema 10/28/2014  . Acute hyperglycemia 10/28/2014  . Chest pain 01/04/2014  . COPD (chronic obstructive pulmonary disease) 08/26/2013  . Acute on chronic respiratory failure with hypoxia 02/07/2013  . TOBACCO USER 02/11/2010  . Essential hypertension 08/07/2009  . Coronary atherosclerosis 08/07/2009   Severe COPD with frequent exacerbation:Doing relatively well, claims that shortness of breath is very close to usual baseline. Moving air on exam, with some wheezing. Claims he finished his antibiotic course, and is now back on usual 30 mg dosing of prednisone. He claims he uses nebulized bronchodilators 4-5 times a day, he could not afford inhaled nebulized steroids-but is using the [facility] inhaler occasionally. He continues to be on 2-3 L of oxygen at all times. I have asked social work/case management to see if we can provide him more assistance at home so patient can access more resources. I have made him a follow-up appointment with the pulmonary clinic on 11/23 at 4  PM. For now, continue with current nebulized bronchodilators, I have encouraged him to regularly use his inhaled steroids. I will ask social work/case management to see if he can get more assistance for medications as well.Suspect, that bilateral chest pain is musculoskeletal, will provide a refill for tramadol. Encouraged use of Tylenol as well.  Chronic respiratory failure: secondary to severe COPD, continue oxygen.  Obstructive sleep apnea: unable to afford CPAP, we'll ask case management/social work to see if he can qualify for patient assistance program. He claims he has a sleep study coming up soon.  Tobacco abuse: patient claims he has not smoked a significant for the past 3-4 weeks, he was congratulated, and counseled against further tobacco abuse.  CAD: claims that he has not taken Plavix for the past 2-3 months because of financial issues, will see if he can be provided with some patient assistance programs. Continue aspirin. May need to change metoprolol to bisoprolol at some point.  Hypertension: continue with metoprolol, may need to be switched to bisoprolol at some point if bronchospasm continues.  Diabetes: started on metformin this past admission, recheck A1c in the next 3-6 months.  Bilateral lower extremity edema: on chronic Lasix, will increase Lasix to 40 mg, add KCl. Will need to recheck electrolytes in the week.  Reason for frequent admissions/ER visits:COPD excessive sedation  Health Maintenance :Will be done at subsequent visits  Next follow up visit at [facility] - one week  Any follow ups or referrals:appointment with Tammy Parrott-pulmonology-November 23 at 4 PM   Objective:   Filed Vitals:   11/04/14 1438  BP: 129/79  Pulse: 116  Temp: 98.2 F (36.8 C)  TempSrc: Oral  Resp: 20  SpO2: 96%    Wt Readings from Last 3 Encounters:  10/29/14 212 lb 3.2 oz (96.253 kg)  07/08/14 211 lb (95.709 kg)  06/03/14 210 lb (95.255 kg)      Medication List         This list is accurate as of: 11/04/14  3:18 PM.  Always use your most recent med list.               albuterol 108 (90 BASE) MCG/ACT inhaler  Commonly known as:  PROVENTIL HFA;VENTOLIN HFA  Inhale 2 puffs into the lungs every 4 (four) hours as needed for wheezing or shortness of breath.     albuterol (2.5 MG/3ML) 0.083% nebulizer solution  Commonly known as:  PROVENTIL  Take 3 mLs (2.5 mg total) by nebulization every 6 (six) hours as needed for wheezing or shortness of breath. Dx 496     albuterol (2.5 MG/3ML) 0.083% nebulizer solution  Commonly known as:  PROVENTIL  Take 3 mLs (2.5 mg total) by nebulization every 6 (six) hours as needed for wheezing or shortness of breath.     arformoterol 15 MCG/2ML Nebu  Commonly known as:  BROVANA  Take 2 mLs (15 mcg total) by nebulization 2 (two) times daily.     aspirin 325 MG tablet  Take 1 tablet (325 mg total) by mouth every morning.     clopidogrel 75 MG tablet  Commonly known as:  PLAVIX  Take 1 tablet (75 mg total) by mouth daily.     fluticasone 50 MCG/ACT nasal spray  Commonly known as:  FLONASE  Place 2 sprays into both nostrils 2 (two) times daily.     furosemide 20 MG tablet  Commonly known as:  LASIX  TAKE 1 TABLET (20 MG TOTAL) BY MOUTH DAILY.     furosemide 20 MG tablet  Commonly known as:  LASIX  Take 2 tablets (40 mg total) by mouth daily.     ipratropium 0.02 % nebulizer solution  Commonly known as:  ATROVENT  Take 1.25 mLs (0.25 mg total) by nebulization every 6 (six) hours as needed for wheezing or shortness of breath.     levofloxacin 500 MG tablet  Commonly known as:  LEVAQUIN  Take 1 tablet (500 mg total) by mouth daily. Take one tablet daily for a total of 2 days     metFORMIN 500 MG tablet  Commonly known as:  GLUCOPHAGE  Take 1 tablet (500 mg total) by mouth 2 (two) times daily with a meal.     metoprolol 50 MG tablet  Commonly known as:  LOPRESSOR  Take 1 tablet (50 mg total) by mouth 2 (two)  times daily.     mometasone-formoterol 100-5 MCG/ACT Aero  Commonly known as:  DULERA  Inhale 2 puffs into the lungs 2 (two) times daily.     nystatin 100000 UNIT/ML suspension  Commonly known as:  MYCOSTATIN  Take 5 mLs (500,000 Units total) by mouth 4 (four) times daily.     pantoprazole 40 MG tablet  Commonly known as:  PROTONIX  Take 1 tablet (40 mg total) by mouth daily at 12 noon.     potassium chloride SA 20 MEQ tablet  Commonly known as:  K-DUR,KLOR-CON  Take 1 tablet (20 mEq total) by mouth daily.     predniSONE 10 MG tablet  Commonly known as:  DELTASONE  Take 3 pills (30 mg) daily     terbutaline 2.5 MG tablet  Commonly known as:  BRETHINE  Take 1 tablet (2.5 mg total) by mouth every 6 (six) hours.     traMADol 50 MG tablet  Commonly known as:  ULTRAM  Take 1 tablet (50 mg total) by mouth every 6 (six) hours as needed (for breakthrough pain).     traZODone 50 MG tablet  Commonly known as:  DESYREL  Take 1 tablet (50 mg total) by mouth at bedtime as needed for sleep.        Physical Exam Awake Alert, Oriented X 3, No new F.N deficits, Normal affect .AT,PERRAL Supple Neck,No JVD, No cervical lymphadenopathy appriciated.  Symmetrical Chest wall movement, Good air movement bilaterally, +rhonchi-but appears comfortable RRR,No Gallops,Rubs or new Murmurs, No Parasternal Heave +ve B.Sounds, Abd Soft, No tenderness, No  organomegaly appriciated, No rebound - guarding or rigidity. No Cyanosis, Clubbing , No new Rash or bruise .++ Edema   Data Review   Micro Results Recent Results (from the past 240 hour(s))  Culture, blood (routine x 2)     Status: None   Collection Time: 10/27/14 11:15 PM  Result Value Ref Range Status   Specimen Description BLOOD RIGHT ARM  Final   Special Requests BOTTLES DRAWN AEROBIC AND ANAEROBIC 5CC EACH  Final   Culture  Setup Time   Final    10/28/2014 02:51 Performed at [facility]    Culture   Final    NO GROWTH 5  DAYS Performed at [facility]    Report Status 11/03/2014 FINAL  Final  Culture, blood (routine x 2)     Status: None   Collection Time: 10/27/14 11:24 PM  Result Value Ref Range Status   Specimen Description BLOOD RIGHT FOREARM  Final   Special Requests BOTTLES DRAWN AEROBIC AND ANAEROBIC 10CC EACH  Final   Culture  Setup Time   Final    10/28/2014 02:52 Performed at [facility]    Culture   Final    NO GROWTH 5 DAYS Performed at [facility]    Report Status 11/03/2014 FINAL  Final  MRSA PCR Screening     Status: None   Collection Time: 10/28/14  1:47 AM  Result Value Ref Range Status   MRSA by PCR NEGATIVE NEGATIVE Final    Comment:        The GeneXpert MRSA Assay (FDA approved for NASAL specimens only), is one component of a comprehensive MRSA colonization surveillance program. It is not intended to diagnose MRSA infection nor to guide or monitor treatment for MRSA infections.   Culture, sputum-assessment     Status: None   Collection Time: 10/28/14  8:22 AM  Result Value Ref Range Status   Specimen Description SPUTUM  Final   Special Requests NONE  Final   Sputum evaluation   Final    MICROSCOPIC FINDINGS SUGGEST THAT THIS SPECIMEN IS NOT REPRESENTATIVE OF LOWER RESPIRATORY SECRETIONS. PLEASE RECOLLECT. Gram Stain Report Called to,Read Back By and Verified With: TATA CARBONE,RN AT 0920 10/28/14 BY K BARR    Report Status 10/28/2014 FINAL  Final     CBC  Recent Labs Lab 10/31/14 0750  WBC 6.8  HGB 11.9*  HCT 38.0*  PLT 158  MCV 93.4  MCH 29.2  MCHC 31.3  RDW 15.1    Chemistries   Recent Labs Lab 10/31/14 0750  NA 143  K 4.4  CL 97  CO2 34*  GLUCOSE 286*  BUN 17  CREATININE 0.55  CALCIUM 8.8   ------------------------------------------------------------------------------------------------------------------ estimated creatinine clearance is 109 mL/min (by C-G formula based on Cr of  0.55). ------------------------------------------------------------------------------------------------------------------ No results for input(s): HGBA1C in the last 72 hours. ------------------------------------------------------------------------------------------------------------------ No results for input(s): CHOL, HDL, LDLCALC, TRIG, CHOLHDL, LDLDIRECT in the last 72 hours. ------------------------------------------------------------------------------------------------------------------ No results for input(s): TSH, T4TOTAL, T3FREE, THYROIDAB in the last 72 hours.  Invalid input(s): FREET3 ------------------------------------------------------------------------------------------------------------------ No results for input(s): VITAMINB12, FOLATE, FERRITIN, TIBC, IRON, RETICCTPCT in the last 72 hours.  Coagulation profile No results for input(s): INR, PROTIME in the last 168 hours.  No results for input(s): DDIMER in the last 72 hours.  Cardiac Enzymes No results for input(s): CKMB, TROPONINI, MYOGLOBIN in the last 168 hours.  Invalid input(s): CK ------------------------------------------------------------------------------------------------------------------ Invalid input(s): POCBNP   Time Spent in minutes  45  Jeoffrey Massed M.D on 11/04/2014 at 3:18 PM

## 2014-11-04 NOTE — Progress Notes (Signed)
Transitional Care Clinic Care Coordination Note:   Admit date: 10/27/14 Discharge date: 10/31/14 Discharge Disposition: Home/ self care Patient contact information: [phone]  Patient evaluated for community Transitional Care Management services with TCC Program as a benefit of patient's [employer]. Discussed TCC Care Management services. TCC Services has been accepted by verbal consent. Chris GullyFaith [name] [phone] (daughter) is patient's authorized contact. PMH Essential hypertension, Coronary atherosclerosis , Acute on chronic respiratory failure with hypoxia,COPD,Acute hyperglycemia, Respiratory distress, Diabetes, OSA.   TCC patient will receive a post discharge transition of care call 24- 48 hours, and will be evaluated for close f/u for up to initial 30 days post discharge.  TCC has Scheduled post discharge f/u visit for assessments and disease process education for 11/04/14 at 2:15pm.  Explained that TCC Care Management services does not replace or interfere with any services that are arranged by inpatient case management or social work. For additional questions or referrals please contact   [facility] Liaison at [phone]    Consented for Chronic Care Management for Transitional Care Clinic by patient.  Patient scheduled for Transitional Care appointment on Wed.  11/04/14 at 2:15.  Clinic information and appointment time provided to patient.   Hospital Course:   Chief Complaint: Generalized weakness  Principal Problem: Active Problems:  Essential hypertension  Coronary atherosclerosis  Acute on chronic respiratory failure with hypoxia  Acute exacerbation of chronic obstructive pulmonary disease (COPD)  Bilateral lower extremity edema  Acute hyperglycemia  COPD exacerbation  Respiratory distress  Diabetes  OSA (obstructive sleep     Assessment:       Home Environment[place]       Support System: Daughter Chris GullyFaith [name]    Level of functioning: independent       Home DME: none       [facility]       Transportation: Private Vehicle       Medications: Patient is on Pulmicort he [facility], he needs to        Complete a new application for assistance with drug. Will notify pharmacy       [facility]        Identified Barriers:        PCP[facility]  [facility]           Last office visit: 07/08/2014  Arranged services:    Services communicated  Patient Education: COPD self management    Patient verbalizes understanding of Transitional Care Clinic services and aware of appointment time/location. All information provided.

## 2014-11-04 NOTE — Progress Notes (Unsigned)
Pt comes in per HFU- COPD, chronic respiratory failure Pt is on 2-3 liter n/c continuous oxygen Sob at rest,difficulty sleeping at night due to sob Orthopnea noted Sats 95-96% 2 liters n/c Need med refills Chronic back/rib cage pain

## 2014-11-07 ENCOUNTER — Ambulatory Visit: Payer: Medicare Other | Admitting: Emergency Medicine

## 2014-11-07 ENCOUNTER — Telehealth: Payer: Self-pay | Admitting: Emergency Medicine

## 2014-11-07 NOTE — Telephone Encounter (Signed)
I spoke with the pt and he is c/o having increased SOB with minimal activity while using oxygen. He states his saturations are dropping to the low 80's with 3 liters. He also c/o feeling like he has fluid build-up and hears a gurgling sound in his chest. He states at rest his saturations are low 90's with 3 liters oxygen. Appt set today at 4:30 with RB. Carron CurieJennifer Lennie Vasco, CMA

## 2014-11-11 ENCOUNTER — Inpatient Hospital Stay: Payer: Medicare Other | Admitting: Adult Health

## 2014-11-12 ENCOUNTER — Ambulatory Visit: Payer: Medicare Other | Admitting: Internal Medicine

## 2014-11-20 ENCOUNTER — Other Ambulatory Visit: Payer: Self-pay | Admitting: Internal Medicine

## 2014-11-20 MED ORDER — MOMETASONE FURO-FORMOTEROL FUM 100-5 MCG/ACT IN AERO: 2.0000 | INHALATION_SPRAY | Freq: Two times a day (BID) | RESPIRATORY_TRACT | Status: AC

## 2014-12-03 ENCOUNTER — Telehealth: Payer: Self-pay | Admitting: Internal Medicine

## 2014-12-03 ENCOUNTER — Ambulatory Visit: Payer: Medicare Other | Admitting: *Deleted

## 2014-12-03 NOTE — Telephone Encounter (Signed)
Pt.'s daughter came into facility stating that the pt had fell and injured his left arm, pt's daughter states that she believes physical therapy is not helping her father and would like to speak to nurse. Please f/u with pt.

## 2014-12-11 NOTE — Telephone Encounter (Signed)
[name] at 12/03/2014 2:55 PM     Status: Signed       Expand All Collapse All   Pt.'s daughter came into facility stating that the pt had fell and injured his left arm, pt's daughter states that she believes physical therapy is not helping her father and would like to speak to nurse. Please f/u with pt.

## 2014-12-18 ENCOUNTER — Emergency Department: Payer: Self-pay | Admitting: Emergency Medicine

## 2014-12-18 LAB — BASIC METABOLIC PANEL
Anion Gap: 8 (ref 7–16)
BUN: 9 mg/dL (ref 7–18)
Calcium, Total: 8.9 mg/dL (ref 8.5–10.1)
Chloride: 97 mmol/L — ABNORMAL LOW (ref 98–107)
Co2: 34 mmol/L — ABNORMAL HIGH (ref 21–32)
Creatinine: 0.86 mg/dL (ref 0.60–1.30)
EGFR (Non-African Amer.): >60
Glucose: 195 mg/dL — ABNORMAL HIGH (ref 65–99)
OSMOLALITY: 282 (ref 275–301)
POTASSIUM: 4.5 mmol/L (ref 3.5–5.1)
Sodium: 139 mmol/L (ref 136–145)

## 2014-12-18 LAB — PRO B NATRIURETIC PEPTIDE: B-Type Natriuretic Peptide: 121 pg/mL (ref 0–125)

## 2014-12-18 LAB — CBC
HCT: 34.2 % — ABNORMAL LOW (ref 40.0–52.0)
HGB: 10.9 g/dL — AB (ref 13.0–18.0)
MCH: 29.1 pg (ref 26.0–34.0)
MCHC: 32 g/dL (ref 32.0–36.0)
MCV: 91 fL (ref 80–100)
Platelet: 239 10*3/uL (ref 150–440)
RBC: 3.76 10*6/uL — AB (ref 4.40–5.90)
RDW: 17 % — ABNORMAL HIGH (ref 11.5–14.5)
WBC: 6.3 10*3/uL (ref 3.8–10.6)

## 2014-12-18 LAB — TROPONIN I
Troponin-I: <0.02 ng/mL
Troponin-I: <0.02 ng/mL

## 2014-12-21 NOTE — Telephone Encounter (Signed)
Enter record in error

## 2014-12-25 ENCOUNTER — Ambulatory Visit: Payer: Medicare Other | Admitting: Internal Medicine

## 2014-12-26 ENCOUNTER — Other Ambulatory Visit: Payer: Self-pay | Admitting: Emergency Medicine

## 2014-12-27 ENCOUNTER — Other Ambulatory Visit: Payer: Self-pay | Admitting: *Deleted

## 2014-12-27 MED ORDER — IPRATROPIUM BROMIDE 0.02 % IN SOLN: 0.5000 mg | RESPIRATORY_TRACT | Status: AC | PRN

## 2015-01-01 ENCOUNTER — Ambulatory Visit: Payer: Medicare Other | Admitting: Internal Medicine

## 2015-01-16 ENCOUNTER — Ambulatory Visit (HOSPITAL_BASED_OUTPATIENT_CLINIC_OR_DEPARTMENT_OTHER): Payer: Medicare Other

## 2015-01-23 ENCOUNTER — Telehealth: Payer: Self-pay | Admitting: Emergency Medicine

## 2015-01-23 MED ORDER — PREDNISONE 10 MG PO TABS: ORAL_TABLET | ORAL | Status: DC

## 2015-01-23 NOTE — Telephone Encounter (Signed)
Called pt and is aware refill sent in. Nothing further needed

## 2015-02-25 ENCOUNTER — Other Ambulatory Visit: Payer: Self-pay | Admitting: Internal Medicine

## 2015-02-27 ENCOUNTER — Ambulatory Visit: Payer: Medicare Other | Admitting: Internal Medicine

## 2015-03-02 ENCOUNTER — Other Ambulatory Visit: Payer: Self-pay | Admitting: Internal Medicine

## 2015-03-06 ENCOUNTER — Inpatient Hospital Stay: Payer: Medicare Other | Admitting: Internal Medicine

## 2015-03-20 ENCOUNTER — Encounter (HOSPITAL_BASED_OUTPATIENT_CLINIC_OR_DEPARTMENT_OTHER): Payer: Medicare Other

## 2015-05-04 ENCOUNTER — Other Ambulatory Visit: Payer: Self-pay | Admitting: Internal Medicine

## 2015-05-04 ENCOUNTER — Inpatient Hospital Stay (HOSPITAL_COMMUNITY)
Admission: EM | Admit: 2015-05-04 | Discharge: 2015-05-19 | DRG: 190 | Disposition: A | Discharge status: Skilled Nursing Facility | Payer: Medicare Other | Attending: Internal Medicine | Admitting: Internal Medicine

## 2015-05-04 ENCOUNTER — Encounter (HOSPITAL_COMMUNITY): Payer: Self-pay | Admitting: Emergency Medicine

## 2015-05-04 ENCOUNTER — Emergency Department (HOSPITAL_COMMUNITY): Payer: Medicare Other

## 2015-05-04 DIAGNOSIS — L8990 Pressure ulcer of unspecified site, unspecified stage: Secondary | ICD-10-CM | POA: Insufficient documentation

## 2015-05-04 DIAGNOSIS — Z7982 Long term (current) use of aspirin: Secondary | ICD-10-CM

## 2015-05-04 DIAGNOSIS — R0600 Dyspnea, unspecified: Secondary | ICD-10-CM

## 2015-05-04 DIAGNOSIS — J9690 Respiratory failure, unspecified, unspecified whether with hypoxia or hypercapnia: Secondary | ICD-10-CM

## 2015-05-04 DIAGNOSIS — IMO0002 Reserved for concepts with insufficient information to code with codable children: Secondary | ICD-10-CM | POA: Insufficient documentation

## 2015-05-04 DIAGNOSIS — Z888 Allergy status to other drugs, medicaments and biological substances status: Secondary | ICD-10-CM

## 2015-05-04 DIAGNOSIS — I472 Ventricular tachycardia: Secondary | ICD-10-CM | POA: Diagnosis not present

## 2015-05-04 DIAGNOSIS — J851 Abscess of lung with pneumonia: Secondary | ICD-10-CM

## 2015-05-04 DIAGNOSIS — Z6825 Body mass index (BMI) 25.0-25.9, adult: Secondary | ICD-10-CM

## 2015-05-04 DIAGNOSIS — I959 Hypotension, unspecified: Secondary | ICD-10-CM | POA: Diagnosis present

## 2015-05-04 DIAGNOSIS — Z7902 Long term (current) use of antithrombotics/antiplatelets: Secondary | ICD-10-CM

## 2015-05-04 DIAGNOSIS — G8929 Other chronic pain: Secondary | ICD-10-CM | POA: Diagnosis present

## 2015-05-04 DIAGNOSIS — E538 Deficiency of other specified B group vitamins: Secondary | ICD-10-CM | POA: Diagnosis present

## 2015-05-04 DIAGNOSIS — E119 Type 2 diabetes mellitus without complications: Secondary | ICD-10-CM

## 2015-05-04 DIAGNOSIS — R0789 Other chest pain: Secondary | ICD-10-CM | POA: Insufficient documentation

## 2015-05-04 DIAGNOSIS — I5033 Acute on chronic diastolic (congestive) heart failure: Secondary | ICD-10-CM | POA: Diagnosis present

## 2015-05-04 DIAGNOSIS — Z7952 Long term (current) use of systemic steroids: Secondary | ICD-10-CM

## 2015-05-04 DIAGNOSIS — R52 Pain, unspecified: Secondary | ICD-10-CM

## 2015-05-04 DIAGNOSIS — Z87891 Personal history of nicotine dependence: Secondary | ICD-10-CM

## 2015-05-04 DIAGNOSIS — J189 Pneumonia, unspecified organism: Secondary | ICD-10-CM | POA: Diagnosis not present

## 2015-05-04 DIAGNOSIS — Y95 Nosocomial condition: Secondary | ICD-10-CM | POA: Diagnosis not present

## 2015-05-04 DIAGNOSIS — J9620 Acute and chronic respiratory failure, unspecified whether with hypoxia or hypercapnia: Secondary | ICD-10-CM | POA: Clinically undetermined

## 2015-05-04 DIAGNOSIS — K219 Gastro-esophageal reflux disease without esophagitis: Secondary | ICD-10-CM | POA: Diagnosis present

## 2015-05-04 DIAGNOSIS — R0902 Hypoxemia: Secondary | ICD-10-CM | POA: Insufficient documentation

## 2015-05-04 DIAGNOSIS — J9621 Acute and chronic respiratory failure with hypoxia: Secondary | ICD-10-CM | POA: Diagnosis present

## 2015-05-04 DIAGNOSIS — J852 Abscess of lung without pneumonia: Secondary | ICD-10-CM

## 2015-05-04 DIAGNOSIS — Z8679 Personal history of other diseases of the circulatory system: Secondary | ICD-10-CM

## 2015-05-04 DIAGNOSIS — E785 Hyperlipidemia, unspecified: Secondary | ICD-10-CM | POA: Diagnosis present

## 2015-05-04 DIAGNOSIS — Z79891 Long term (current) use of opiate analgesic: Secondary | ICD-10-CM

## 2015-05-04 DIAGNOSIS — I2510 Atherosclerotic heart disease of native coronary artery without angina pectoris: Secondary | ICD-10-CM | POA: Diagnosis present

## 2015-05-04 DIAGNOSIS — R600 Localized edema: Secondary | ICD-10-CM | POA: Diagnosis present

## 2015-05-04 DIAGNOSIS — L0291 Cutaneous abscess, unspecified: Secondary | ICD-10-CM

## 2015-05-04 DIAGNOSIS — J15212 Pneumonia due to Methicillin resistant Staphylococcus aureus: Secondary | ICD-10-CM | POA: Diagnosis not present

## 2015-05-04 DIAGNOSIS — G92 Toxic encephalopathy: Secondary | ICD-10-CM | POA: Diagnosis present

## 2015-05-04 DIAGNOSIS — E1165 Type 2 diabetes mellitus with hyperglycemia: Secondary | ICD-10-CM | POA: Diagnosis present

## 2015-05-04 DIAGNOSIS — Z79899 Other long term (current) drug therapy: Secondary | ICD-10-CM

## 2015-05-04 DIAGNOSIS — J441 Chronic obstructive pulmonary disease with (acute) exacerbation: Principal | ICD-10-CM | POA: Diagnosis present

## 2015-05-04 DIAGNOSIS — D638 Anemia in other chronic diseases classified elsewhere: Secondary | ICD-10-CM | POA: Diagnosis present

## 2015-05-04 DIAGNOSIS — J9602 Acute respiratory failure with hypercapnia: Secondary | ICD-10-CM | POA: Diagnosis not present

## 2015-05-04 DIAGNOSIS — Z9981 Dependence on supplemental oxygen: Secondary | ICD-10-CM

## 2015-05-04 DIAGNOSIS — I10 Essential (primary) hypertension: Secondary | ICD-10-CM | POA: Diagnosis present

## 2015-05-04 DIAGNOSIS — J850 Gangrene and necrosis of lung: Secondary | ICD-10-CM

## 2015-05-04 DIAGNOSIS — I5032 Chronic diastolic (congestive) heart failure: Secondary | ICD-10-CM | POA: Diagnosis present

## 2015-05-04 DIAGNOSIS — T40605A Adverse effect of unspecified narcotics, initial encounter: Secondary | ICD-10-CM | POA: Diagnosis not present

## 2015-05-04 DIAGNOSIS — R29898 Other symptoms and signs involving the musculoskeletal system: Secondary | ICD-10-CM | POA: Diagnosis present

## 2015-05-04 DIAGNOSIS — E1151 Type 2 diabetes mellitus with diabetic peripheral angiopathy without gangrene: Secondary | ICD-10-CM | POA: Insufficient documentation

## 2015-05-04 DIAGNOSIS — Z955 Presence of coronary angioplasty implant and graft: Secondary | ICD-10-CM

## 2015-05-04 DIAGNOSIS — I252 Old myocardial infarction: Secondary | ICD-10-CM

## 2015-05-04 HISTORY — DX: Heart failure, unspecified: I50.9

## 2015-05-04 LAB — BASIC METABOLIC PANEL
Anion gap: 9 (ref 5–15)
BUN: 15 mg/dL (ref 6–20)
CALCIUM: 8.4 mg/dL — AB (ref 8.9–10.3)
CHLORIDE: 96 mmol/L — AB (ref 101–111)
CO2: 32 mmol/L (ref 22–32)
Creatinine, Ser: 0.57 mg/dL — ABNORMAL LOW (ref 0.61–1.24)
GFR calc non Af Amer: >60 mL/min (ref >60)
Glucose, Bld: 206 mg/dL — ABNORMAL HIGH (ref 65–99)
Potassium: 4.3 mmol/L (ref 3.5–5.1)
Sodium: 137 mmol/L (ref 135–145)

## 2015-05-04 LAB — CBC
HEMATOCRIT: 29.2 % — AB (ref 39.0–52.0)
Hemoglobin: 8.7 g/dL — ABNORMAL LOW (ref 13.0–17.0)
MCH: 27.9 pg (ref 26.0–34.0)
MCHC: 29.8 g/dL — ABNORMAL LOW (ref 30.0–36.0)
MCV: 93.6 fL (ref 78.0–100.0)
Platelets: 205 10*3/uL (ref 150–400)
RBC: 3.12 MIL/uL — AB (ref 4.22–5.81)
RDW: 17.7 % — ABNORMAL HIGH (ref 11.5–15.5)
WBC: 9.2 10*3/uL (ref 4.0–10.5)

## 2015-05-04 LAB — I-STAT TROPONIN, ED: Troponin i, poc: 0.02 ng/mL (ref 0.00–0.08)

## 2015-05-04 MED ORDER — NITROGLYCERIN 2 % TD OINT
1.0000 [in_us] | TOPICAL_OINTMENT | Freq: Once | TRANSDERMAL | Status: AC
  Administered 2015-05-04: 1 [in_us] via TOPICAL
  Filled 2015-05-04: qty 1

## 2015-05-04 NOTE — ED Notes (Signed)
Pt arrives via ems with c/o sob and chest pain that began this morning. Pt stated sob and cp had gotten better and after he laid down to go to bed it came back. Pt had a total of 6.5 mg of atrovent prior to arrival. Pt wears 4L 02 at home. Some accessory muscle use present.

## 2015-05-04 NOTE — ED Provider Notes (Signed)
CSN: [id number]     Arrival date & time 05/04/15  2248 History   First MD Initiated Contact with Patient 05/04/15 2257     This chart was scribed for Shon Baton, MD by Arlan Organ, ED Scribe. This patient was seen in room A07C/A07C and the patient's care was started 11:07 PM.   Chief Complaint  Patient presents with  . Shortness of Breath  . Chest Pain   The history is provided by the patient. No language interpreter was used.    HPI Comments: Jarone Ostergaard. brought in by EMS is a 64 y.o. male with a PMHx of COPD, CAD, HTN, hyperlipidemia, MI-January 2015, GERD, and stroke  who presents to the Emergency Department complaining of constant, ongoing, unchanged chest pain x 1 day. Pain is described as "prior heart attack" and rated 7-8/10 at this time. Discomfort is exacerbated when in a supine position and mildly alleviated when sitting up. Pt also reports ongoing shortness of breath and cough. Total of 6.5 mg of Atrovent given prior to arrival. No recent fever or chills. Mr. Woodford typically uses 4L of oxygen at home. Pt with known allergy to Lipitor.  Of note, patient reports admission to [facility] in February. He states at that time he was found to have a low hemoglobin and required transfusion. Past Medical History  Diagnosis Date  . COPD (chronic obstructive pulmonary disease)   . CAD (coronary artery disease)   . Hypertension   . Hyperlipidemia   . Osteomyelitis   . Myocardial infarction     "I've had a couple since 2006" (12/26/2013)  . Anginal pain   . Asthma   . Pneumonia     "have had it several times; maybe now too": (12/26/2013)  . Shortness of breath     "all the time" (12/26/2013)  . On home oxygen therapy     "2-3L; 24/7" (12/26/2013)  . GERD (gastroesophageal reflux disease)   . Daily headache   . Stroke     "they say I've had a couple" denies residual on 12/26/2013  . Arthritis     "hands; feet" (12/26/2013)  . Osteomyelitis hip     "right"   . Chronic lower back pain    Past Surgical History  Procedure Laterality Date  . Appendectomy  1963  . Cardiac catheterization  ? 2009; ?2012  . Coronary angioplasty with stent placement  2006; ?2008; ?2010    "2 + 2 + 1"   Family History  Problem Relation Age of Onset  . Coronary artery disease    . Hypertension     History  Substance Use Topics  . Smoking status: Former Smoker -- 0.25 packs/day for 51 years    Types: Cigarettes    Quit date: 09/27/2014  . Smokeless tobacco: Never Used  . Alcohol Use: No    Review of Systems  Constitutional: Negative.  Negative for fever and chills.  Respiratory: Positive for cough and shortness of breath. Negative for chest tightness.   Cardiovascular: Positive for chest pain and leg swelling.  Gastrointestinal: Negative.  Negative for nausea, vomiting, abdominal pain and diarrhea.  Genitourinary: Negative.  Negative for dysuria.  Musculoskeletal: Negative for back pain.  Psychiatric/Behavioral: Negative for confusion.  All other systems reviewed and are negative.     Allergies  Lipitor  Home Medications   Prior to Admission medications   Medication Sig Start Date End Date Taking? Authorizing Provider  albuterol (PROVENTIL) (2.5 MG/3ML) 0.083%  nebulizer solution Take 3 mLs (2.5 mg total) by nebulization every 6 (six) hours as needed for wheezing or shortness of breath. 11/04/14  Yes Shanker Levora DredgeM Ghimire, MD  aspirin 325 MG tablet Take 1 tablet (325 mg total) by mouth every morning. 11/04/14  Yes Shanker Levora DredgeM Ghimire, MD  clopidogrel (PLAVIX) 75 MG tablet Take 1 tablet (75 mg total) by mouth daily. 11/04/14  Yes Shanker Levora DredgeM Ghimire, MD  fluticasone (FLONASE) 50 MCG/ACT nasal spray Place 2 sprays into both nostrils 2 (two) times daily. 11/04/14  Yes Shanker Levora DredgeM Ghimire, MD  furosemide (LASIX) 40 MG tablet Take 40 mg by mouth daily.   Yes Historical Provider, MD  ipratropium-albuterol (DUONEB) 0.5-2.5 (3) MG/3ML SOLN Take 3 mLs by nebulization  every 4 (four) hours as needed (sob).   Yes Historical Provider, MD  metFORMIN (GLUCOPHAGE) 500 MG tablet Take 1 tablet (500 mg total) by mouth 2 (two) times daily with a meal. 11/04/14  Yes Shanker Levora DredgeM Ghimire, MD  metoprolol (LOPRESSOR) 50 MG tablet Take 1 tablet (50 mg total) by mouth 2 (two) times daily. 11/04/14  Yes Shanker Levora DredgeM Ghimire, MD  Oxycodone-Acetaminophen (PERCOCET PO) Take 1-2 tablets by mouth every 6 (six) hours as needed (pain).   Yes Historical Provider, MD  pantoprazole (PROTONIX) 40 MG tablet Take 1 tablet (40 mg total) by mouth daily at 12 noon. 11/04/14  Yes Shanker Levora DredgeM Ghimire, MD  potassium chloride SA (K-DUR,KLOR-CON) 20 MEQ tablet Take 1 tablet (20 mEq total) by mouth daily. 11/04/14  Yes Shanker Levora DredgeM Ghimire, MD  predniSONE (DELTASONE) 10 MG tablet Take 3 pills (30 mg) daily 01/23/15  Yes Leslye Peerobert S Byrum, MD  terbutaline (BRETHINE) 2.5 MG tablet Take 1 tablet (2.5 mg total) by mouth every 6 (six) hours. 11/04/14  Yes Shanker Levora DredgeM Ghimire, MD  traMADol (ULTRAM) 50 MG tablet Take 1 tablet (50 mg total) by mouth every 6 (six) hours as needed (for breakthrough pain). 11/04/14  Yes Shanker Levora DredgeM Ghimire, MD  traZODone (DESYREL) 50 MG tablet Take 1 tablet (50 mg total) by mouth at bedtime as needed for sleep. 11/04/14  Yes Shanker Levora DredgeM Ghimire, MD  albuterol (PROVENTIL HFA;VENTOLIN HFA) 108 (90 BASE) MCG/ACT inhaler Inhale 2 puffs into the lungs every 4 (four) hours as needed for wheezing or shortness of breath. Patient not taking: Reported on 05/05/2015 11/04/14   Maretta BeesShanker M Ghimire, MD  albuterol (PROVENTIL) (2.5 MG/3ML) 0.083% nebulizer solution Take 3 mLs (2.5 mg total) by nebulization every 6 (six) hours as needed for wheezing or shortness of breath. Dx 496 11/04/14   Shanker Levora DredgeM Ghimire, MD  arformoterol (BROVANA) 15 MCG/2ML NEBU Take 2 mLs (15 mcg total) by nebulization 2 (two) times daily. Patient not taking: Reported on 05/05/2015 11/04/14   Maretta BeesShanker M Ghimire, MD  furosemide (LASIX) 20 MG tablet  TAKE 1 TABLET (20 MG TOTAL) BY MOUTH DAILY. 09/13/14   Leslye Peerobert S Byrum, MD  furosemide (LASIX) 20 MG tablet Take 2 tablets (40 mg total) by mouth daily. 11/04/14   Shanker Levora DredgeM Ghimire, MD  ipratropium (ATROVENT) 0.02 % nebulizer solution Take 2.5 mLs (0.5 mg total) by nebulization every 4 (four) hours as needed for wheezing or shortness of breath. 12/27/14   Leslye Peerobert S Byrum, MD  mometasone-formoterol (DULERA) 100-5 MCG/ACT AERO Inhale 2 puffs into the lungs 2 (two) times daily. Patient not taking: Reported on 05/05/2015 11/20/14   Quentin Angstlugbemiga E Jegede, MD  nystatin (MYCOSTATIN) 100000 UNIT/ML suspension Take 5 mLs (500,000 Units total) by mouth 4 (four) times  daily. Patient not taking: Reported on 05/05/2015 01/09/14   Belkys A Regalado, MD   Triage Vitals: BP 123/71 mmHg  Pulse 91  Temp(Src) 97.9 F (36.6 C) (Oral)  Resp 15  SpO2 96%   Physical Exam  Constitutional: He is oriented to person, place, and time.  Chronically ill-appearing, no acute distress  HENT:  Head: Normocephalic and atraumatic.  Eyes: Pupils are equal, round, and reactive to light.  Cardiovascular: Normal rate, regular rhythm and normal heart sounds.   No murmur heard. Pulmonary/Chest: Effort normal. No respiratory distress. He has wheezes.  Diffuse expiratory wheezing, crackles noted, mild tachypnea  Abdominal: Soft. Bowel sounds are normal. There is no tenderness. There is no rebound.  Musculoskeletal: He exhibits edema.  2+ bilateral lower extremity edema  Neurological: He is alert and oriented to person, place, and time.  Skin: Skin is warm and dry.  Bruising noted over the bilateral forearms  Psychiatric: He has a normal mood and affect.  Nursing note and vitals reviewed.   ED Course  Procedures (including critical care time)  CRITICAL CARE Performed by: Shon BatonHORTON, Shinika Estelle F   Total critical care time: 35 min  Critical care time was exclusive of separately billable procedures and treating other  patients.  Critical care was necessary to treat or prevent imminent or life-threatening deterioration.  Critical care was time spent personally by me on the following activities: development of treatment plan with patient and/or surrogate as well as nursing, discussions with consultants, evaluation of patient's response to treatment, examination of patient, obtaining history from patient or surrogate, ordering and performing treatments and interventions, ordering and review of laboratory studies, ordering and review of radiographic studies, pulse oximetry and re-evaluation of patient's condition.   DIAGNOSTIC STUDIES:   COORDINATION OF CARE: 11:11 PM- Will give Nitroglycerin. Will order CBC, BMP, i-stat troponin, and EKG. Discussed treatment plan with pt at bedside and pt agreed to plan.     Labs Review Labs Reviewed  BRAIN NATRIURETIC PEPTIDE - Abnormal; Notable for the following:    B Natriuretic Peptide 136.0 (*)    All other components within normal limits  CBC - Abnormal; Notable for the following:    RBC 3.12 (*)    Hemoglobin 8.7 (*)    HCT 29.2 (*)    MCHC 29.8 (*)    RDW 17.7 (*)    All other components within normal limits  BASIC METABOLIC PANEL - Abnormal; Notable for the following:    Chloride 96 (*)    Glucose, Bld 206 (*)    Creatinine, Ser 0.57 (*)    Calcium 8.4 (*)    All other components within normal limits  I-STAT TROPOININ, ED    Imaging Review Dg Chest Portable 1 View  05/05/2015   CLINICAL DATA:  Chest pain and shortness of breath.  EXAM: PORTABLE CHEST - 1 VIEW  COMPARISON:  04/21/2015  FINDINGS: Lungs remain hyperinflated. The cardiomediastinal contours are unchanged, heart at the upper limits of normal. Bibasilar atelectasis or scarring is again seen. Pulmonary vasculature is normal. No consolidation, pleural effusion, or pneumothorax. No acute osseous abnormalities are seen.  IMPRESSION: Unchanged hyperinflation without acute pulmonary process.    Electronically Signed   By: Rubye OaksMelanie  Ehinger M.D.   On: 05/05/2015 00:45     EKG Interpretation   Date/Time:  Sunday May 04 2015 22:56:32 EDT Ventricular Rate:  86 PR Interval:  154 QRS Duration: 96 QT Interval:  339 QTC Calculation: 405 R Axis:   94 Text Interpretation:  Sinus rhythm Multiple premature complexes, vent   Right axis deviation Confirmed by Rechy Bost  MD, Emiah Pellicano ([id number]) on  05/04/2015 11:35:04 PM      MDM   Final diagnoses:  COPD exacerbation  History of CHF (congestive heart failure)    Patient presents with worsening shortness of breath over the last day. Mildly tachypnea and wheezing on exam. On home oxygen.  Patient is status post DuoNeb. Repeat DuoNeb ordered. Patient also has evidence of lower extremity edema and volume overload. On Lasix 20 mg daily. No recent medication changes.  EKG and troponin reassuring. BNP 136. No evidence of pulmonary edema on chest x-ray. However, given evidence of lower extremity edema, will dose with IV Lasix. Patient likely is a mixed picture of COPD exacerbation versus mild acute on chronic heart failure.  On multiple rechecks, patient continues to wheeze and have tachypnea a third DuoNeb was ordered. Patient status post Solu-Medrol. Nitroglycerin patch also applied for any element of heart failure. He has been chest pain-free.  Patient noted to be anemic on lab work when compared to prior. Hemoglobin of 8.7 last hemoglobin 6 months ago was 11.9; however, he does report recent admission requiring transfusion. Denies any GI bleeding.  Will admit for symptom control and frequent DuoNeb. Likely COPD exacerbation with mild acute on chronic heart failure.  I personally performed the services described in this documentation, which was scribed in my presence. The recorded information has been reviewed and is accurate.   Shon Baton, MD 05/05/15 [phone]

## 2015-05-04 NOTE — ED Notes (Signed)
MD at bedside. 

## 2015-05-05 DIAGNOSIS — J15212 Pneumonia due to Methicillin resistant Staphylococcus aureus: Secondary | ICD-10-CM | POA: Diagnosis not present

## 2015-05-05 DIAGNOSIS — J851 Abscess of lung with pneumonia: Secondary | ICD-10-CM | POA: Diagnosis not present

## 2015-05-05 DIAGNOSIS — J9620 Acute and chronic respiratory failure, unspecified whether with hypoxia or hypercapnia: Secondary | ICD-10-CM | POA: Diagnosis not present

## 2015-05-05 DIAGNOSIS — Y95 Nosocomial condition: Secondary | ICD-10-CM | POA: Diagnosis not present

## 2015-05-05 DIAGNOSIS — I959 Hypotension, unspecified: Secondary | ICD-10-CM | POA: Diagnosis present

## 2015-05-05 DIAGNOSIS — I2510 Atherosclerotic heart disease of native coronary artery without angina pectoris: Secondary | ICD-10-CM | POA: Diagnosis present

## 2015-05-05 DIAGNOSIS — Z79891 Long term (current) use of opiate analgesic: Secondary | ICD-10-CM | POA: Diagnosis not present

## 2015-05-05 DIAGNOSIS — I472 Ventricular tachycardia: Secondary | ICD-10-CM | POA: Diagnosis not present

## 2015-05-05 DIAGNOSIS — G92 Toxic encephalopathy: Secondary | ICD-10-CM | POA: Diagnosis present

## 2015-05-05 DIAGNOSIS — I252 Old myocardial infarction: Secondary | ICD-10-CM | POA: Diagnosis not present

## 2015-05-05 DIAGNOSIS — Z79899 Other long term (current) drug therapy: Secondary | ICD-10-CM | POA: Diagnosis not present

## 2015-05-05 DIAGNOSIS — R29898 Other symptoms and signs involving the musculoskeletal system: Secondary | ICD-10-CM | POA: Diagnosis present

## 2015-05-05 DIAGNOSIS — I10 Essential (primary) hypertension: Secondary | ICD-10-CM | POA: Diagnosis not present

## 2015-05-05 DIAGNOSIS — Z888 Allergy status to other drugs, medicaments and biological substances status: Secondary | ICD-10-CM | POA: Diagnosis not present

## 2015-05-05 DIAGNOSIS — J9602 Acute respiratory failure with hypercapnia: Secondary | ICD-10-CM | POA: Diagnosis not present

## 2015-05-05 DIAGNOSIS — Z7902 Long term (current) use of antithrombotics/antiplatelets: Secondary | ICD-10-CM | POA: Diagnosis not present

## 2015-05-05 DIAGNOSIS — J852 Abscess of lung without pneumonia: Secondary | ICD-10-CM | POA: Diagnosis not present

## 2015-05-05 DIAGNOSIS — Z955 Presence of coronary angioplasty implant and graft: Secondary | ICD-10-CM | POA: Diagnosis not present

## 2015-05-05 DIAGNOSIS — R600 Localized edema: Secondary | ICD-10-CM | POA: Diagnosis not present

## 2015-05-05 DIAGNOSIS — Z7952 Long term (current) use of systemic steroids: Secondary | ICD-10-CM | POA: Diagnosis not present

## 2015-05-05 DIAGNOSIS — Z7982 Long term (current) use of aspirin: Secondary | ICD-10-CM | POA: Diagnosis not present

## 2015-05-05 DIAGNOSIS — I5033 Acute on chronic diastolic (congestive) heart failure: Secondary | ICD-10-CM | POA: Diagnosis present

## 2015-05-05 DIAGNOSIS — J9622 Acute and chronic respiratory failure with hypercapnia: Secondary | ICD-10-CM | POA: Diagnosis not present

## 2015-05-05 DIAGNOSIS — J850 Gangrene and necrosis of lung: Secondary | ICD-10-CM | POA: Diagnosis not present

## 2015-05-05 DIAGNOSIS — I5032 Chronic diastolic (congestive) heart failure: Secondary | ICD-10-CM | POA: Diagnosis present

## 2015-05-05 DIAGNOSIS — R0902 Hypoxemia: Secondary | ICD-10-CM | POA: Diagnosis not present

## 2015-05-05 DIAGNOSIS — Z87891 Personal history of nicotine dependence: Secondary | ICD-10-CM | POA: Diagnosis not present

## 2015-05-05 DIAGNOSIS — E109 Type 1 diabetes mellitus without complications: Secondary | ICD-10-CM | POA: Diagnosis not present

## 2015-05-05 DIAGNOSIS — G8929 Other chronic pain: Secondary | ICD-10-CM | POA: Diagnosis present

## 2015-05-05 DIAGNOSIS — J9621 Acute and chronic respiratory failure with hypoxia: Secondary | ICD-10-CM | POA: Diagnosis present

## 2015-05-05 DIAGNOSIS — T40605A Adverse effect of unspecified narcotics, initial encounter: Secondary | ICD-10-CM | POA: Diagnosis not present

## 2015-05-05 DIAGNOSIS — Z6825 Body mass index (BMI) 25.0-25.9, adult: Secondary | ICD-10-CM | POA: Diagnosis not present

## 2015-05-05 DIAGNOSIS — R0789 Other chest pain: Secondary | ICD-10-CM

## 2015-05-05 DIAGNOSIS — Z8679 Personal history of other diseases of the circulatory system: Secondary | ICD-10-CM | POA: Insufficient documentation

## 2015-05-05 DIAGNOSIS — E1165 Type 2 diabetes mellitus with hyperglycemia: Secondary | ICD-10-CM | POA: Diagnosis present

## 2015-05-05 DIAGNOSIS — K219 Gastro-esophageal reflux disease without esophagitis: Secondary | ICD-10-CM | POA: Diagnosis present

## 2015-05-05 DIAGNOSIS — D638 Anemia in other chronic diseases classified elsewhere: Secondary | ICD-10-CM | POA: Diagnosis present

## 2015-05-05 DIAGNOSIS — J441 Chronic obstructive pulmonary disease with (acute) exacerbation: Secondary | ICD-10-CM | POA: Diagnosis present

## 2015-05-05 DIAGNOSIS — I509 Heart failure, unspecified: Secondary | ICD-10-CM | POA: Diagnosis not present

## 2015-05-05 DIAGNOSIS — E538 Deficiency of other specified B group vitamins: Secondary | ICD-10-CM | POA: Diagnosis present

## 2015-05-05 DIAGNOSIS — J189 Pneumonia, unspecified organism: Secondary | ICD-10-CM | POA: Diagnosis not present

## 2015-05-05 DIAGNOSIS — E785 Hyperlipidemia, unspecified: Secondary | ICD-10-CM | POA: Diagnosis present

## 2015-05-05 DIAGNOSIS — Z9981 Dependence on supplemental oxygen: Secondary | ICD-10-CM | POA: Diagnosis not present

## 2015-05-05 DIAGNOSIS — E1159 Type 2 diabetes mellitus with other circulatory complications: Secondary | ICD-10-CM | POA: Diagnosis not present

## 2015-05-05 LAB — TROPONIN I
Troponin I: <0.03 ng/mL (ref <0.031)
Troponin I: 0.04 ng/mL — ABNORMAL HIGH (ref <0.031)

## 2015-05-05 LAB — GLUCOSE, CAPILLARY
GLUCOSE-CAPILLARY: 209 mg/dL — AB (ref 65–99)
Glucose-Capillary: 277 mg/dL — ABNORMAL HIGH (ref 65–99)
Glucose-Capillary: 157 mg/dL — ABNORMAL HIGH (ref 65–99)
Glucose-Capillary: 328 mg/dL — ABNORMAL HIGH (ref 65–99)

## 2015-05-05 LAB — BRAIN NATRIURETIC PEPTIDE: B NATRIURETIC PEPTIDE 5: 136 pg/mL — AB (ref 0.0–100.0)

## 2015-05-05 MED ORDER — FUROSEMIDE 10 MG/ML IJ SOLN
40.0000 mg | Freq: Two times a day (BID) | INTRAMUSCULAR | Status: DC
  Administered 2015-05-05 (×2): 40 mg via INTRAVENOUS
  Filled 2015-05-05 (×4): qty 4

## 2015-05-05 MED ORDER — SODIUM CHLORIDE 0.9 % IV SOLN: 250.0000 mL | INTRAVENOUS | Status: DC | PRN

## 2015-05-05 MED ORDER — CLOPIDOGREL BISULFATE 75 MG PO TABS
75.0000 mg | ORAL_TABLET | Freq: Every day | ORAL | Status: DC
  Administered 2015-05-05 – 2015-05-15 (×11): 75 mg via ORAL
  Filled 2015-05-05 (×11): qty 1

## 2015-05-05 MED ORDER — ASPIRIN 325 MG PO TABS
325.0000 mg | ORAL_TABLET | Freq: Every morning | ORAL | Status: DC
  Administered 2015-05-05 – 2015-05-19 (×15): 325 mg via ORAL
  Filled 2015-05-05 (×15): qty 1

## 2015-05-05 MED ORDER — SODIUM CHLORIDE 0.9 % IJ SOLN
3.0000 mL | Freq: Two times a day (BID) | INTRAMUSCULAR | Status: DC
  Administered 2015-05-08 – 2015-05-11 (×8): 3 mL via INTRAVENOUS
  Administered 2015-05-14: 10 mL via INTRAVENOUS
  Administered 2015-05-14 – 2015-05-15 (×2): 3 mL via INTRAVENOUS
  Administered 2015-05-15: 22:00:00 via INTRAVENOUS
  Administered 2015-05-16: 3 mL via INTRAVENOUS
  Administered 2015-05-17: 10 mL via INTRAVENOUS
  Administered 2015-05-18 (×2): 3 mL via INTRAVENOUS

## 2015-05-05 MED ORDER — SODIUM CHLORIDE 0.9 % IJ SOLN
3.0000 mL | Freq: Two times a day (BID) | INTRAMUSCULAR | Status: DC
  Administered 2015-05-06: 3 mL via INTRAVENOUS

## 2015-05-05 MED ORDER — ONDANSETRON HCL 4 MG/2ML IJ SOLN
4.0000 mg | Freq: Four times a day (QID) | INTRAMUSCULAR | Status: DC | PRN
  Administered 2015-05-18: 4 mg via INTRAVENOUS
  Filled 2015-05-05: qty 2

## 2015-05-05 MED ORDER — SODIUM CHLORIDE 0.9 % IJ SOLN: 3.0000 mL | INTRAMUSCULAR | Status: DC | PRN

## 2015-05-05 MED ORDER — IPRATROPIUM-ALBUTEROL 0.5-2.5 (3) MG/3ML IN SOLN
3.0000 mL | RESPIRATORY_TRACT | Status: DC | PRN
  Administered 2015-05-05 – 2015-05-06 (×6): 3 mL via RESPIRATORY_TRACT
  Filled 2015-05-05 (×6): qty 3

## 2015-05-05 MED ORDER — IPRATROPIUM-ALBUTEROL 0.5-2.5 (3) MG/3ML IN SOLN
3.0000 mL | Freq: Once | RESPIRATORY_TRACT | Status: AC
  Administered 2015-05-05: 3 mL via RESPIRATORY_TRACT
  Filled 2015-05-05: qty 3

## 2015-05-05 MED ORDER — TRAZODONE HCL 50 MG PO TABS
50.0000 mg | ORAL_TABLET | Freq: Every evening | ORAL | Status: DC | PRN
  Administered 2015-05-05 – 2015-05-08 (×4): 50 mg via ORAL
  Filled 2015-05-05 (×7): qty 1

## 2015-05-05 MED ORDER — POTASSIUM CHLORIDE CRYS ER 20 MEQ PO TBCR
20.0000 meq | EXTENDED_RELEASE_TABLET | Freq: Every day | ORAL | Status: AC
  Administered 2015-05-05 – 2015-05-06 (×2): 20 meq via ORAL
  Filled 2015-05-05 (×2): qty 1

## 2015-05-05 MED ORDER — ALUM & MAG HYDROXIDE-SIMETH 200-200-20 MG/5ML PO SUSP
30.0000 mL | Freq: Four times a day (QID) | ORAL | Status: DC | PRN
  Administered 2015-05-11: 30 mL via ORAL
  Filled 2015-05-05: qty 30

## 2015-05-05 MED ORDER — ALBUTEROL SULFATE (2.5 MG/3ML) 0.083% IN NEBU
2.5000 mg | INHALATION_SOLUTION | Freq: Four times a day (QID) | RESPIRATORY_TRACT | Status: DC
  Administered 2015-05-05 – 2015-05-06 (×4): 2.5 mg via RESPIRATORY_TRACT
  Filled 2015-05-05 (×4): qty 3

## 2015-05-05 MED ORDER — INSULIN ASPART 100 UNIT/ML ~~LOC~~ SOLN
0.0000 [IU] | Freq: Every day | SUBCUTANEOUS | Status: DC
Start: 2015-05-05 — End: 2015-05-07
  Administered 2015-05-05: 2 [IU] via SUBCUTANEOUS

## 2015-05-05 MED ORDER — ONDANSETRON HCL 4 MG PO TABS: 4.0000 mg | ORAL_TABLET | Freq: Four times a day (QID) | ORAL | Status: DC | PRN

## 2015-05-05 MED ORDER — METHYLPREDNISOLONE SODIUM SUCC 125 MG IJ SOLR
80.0000 mg | Freq: Once | INTRAMUSCULAR | Status: AC
  Administered 2015-05-05: 80 mg via INTRAVENOUS
  Filled 2015-05-05: qty 2

## 2015-05-05 MED ORDER — SODIUM CHLORIDE 0.9 % IJ SOLN
3.0000 mL | Freq: Two times a day (BID) | INTRAMUSCULAR | Status: DC
  Administered 2015-05-05 – 2015-05-07 (×6): 3 mL via INTRAVENOUS

## 2015-05-05 MED ORDER — OXYCODONE-ACETAMINOPHEN 5-325 MG PO TABS
1.0000 | ORAL_TABLET | Freq: Once | ORAL | Status: AC
  Administered 2015-05-05: 1 via ORAL
  Filled 2015-05-05: qty 1

## 2015-05-05 MED ORDER — FUROSEMIDE 10 MG/ML IJ SOLN
40.0000 mg | Freq: Once | INTRAMUSCULAR | Status: AC
  Administered 2015-05-05: 40 mg via INTRAVENOUS
  Filled 2015-05-05: qty 4

## 2015-05-05 MED ORDER — OXYCODONE HCL 5 MG PO TABS
5.0000 mg | ORAL_TABLET | ORAL | Status: DC | PRN
  Administered 2015-05-05 – 2015-05-08 (×15): 5 mg via ORAL
  Filled 2015-05-05 (×15): qty 1

## 2015-05-05 MED ORDER — NITROGLYCERIN 2 % TD OINT
0.5000 [in_us] | TOPICAL_OINTMENT | Freq: Four times a day (QID) | TRANSDERMAL | Status: DC
  Administered 2015-05-05 – 2015-05-19 (×26): 0.5 [in_us] via TOPICAL
  Filled 2015-05-05: qty 30

## 2015-05-05 MED ORDER — LISINOPRIL 2.5 MG PO TABS
2.5000 mg | ORAL_TABLET | Freq: Every day | ORAL | Status: DC
  Administered 2015-05-05 – 2015-05-11 (×7): 2.5 mg via ORAL
  Filled 2015-05-05 (×8): qty 1

## 2015-05-05 MED ORDER — CETYLPYRIDINIUM CHLORIDE 0.05 % MT LIQD
7.0000 mL | Freq: Two times a day (BID) | OROMUCOSAL | Status: DC
  Administered 2015-05-05 – 2015-05-11 (×12): 7 mL via OROMUCOSAL

## 2015-05-05 MED ORDER — ACETAMINOPHEN 325 MG PO TABS
650.0000 mg | ORAL_TABLET | Freq: Four times a day (QID) | ORAL | Status: DC | PRN
  Administered 2015-05-05 – 2015-05-19 (×19): 650 mg via ORAL
  Filled 2015-05-05 (×20): qty 2

## 2015-05-05 MED ORDER — METHYLPREDNISOLONE SODIUM SUCC 125 MG IJ SOLR
125.0000 mg | Freq: Once | INTRAMUSCULAR | Status: AC
  Administered 2015-05-05: 125 mg via INTRAVENOUS
  Filled 2015-05-05: qty 2

## 2015-05-05 MED ORDER — ENOXAPARIN SODIUM 40 MG/0.4ML ~~LOC~~ SOLN
40.0000 mg | SUBCUTANEOUS | Status: DC
  Administered 2015-05-05 – 2015-05-19 (×15): 40 mg via SUBCUTANEOUS
  Filled 2015-05-05 (×15): qty 0.4

## 2015-05-05 MED ORDER — METOPROLOL TARTRATE 50 MG PO TABS
50.0000 mg | ORAL_TABLET | Freq: Two times a day (BID) | ORAL | Status: DC
  Administered 2015-05-05 – 2015-05-11 (×14): 50 mg via ORAL
  Filled 2015-05-05 (×16): qty 1

## 2015-05-05 MED ORDER — HYDROMORPHONE HCL 1 MG/ML IJ SOLN
0.5000 mg | INTRAMUSCULAR | Status: DC | PRN
  Administered 2015-05-05 – 2015-05-06 (×4): 1 mg via INTRAVENOUS
  Filled 2015-05-05 (×4): qty 1

## 2015-05-05 MED ORDER — INSULIN ASPART 100 UNIT/ML ~~LOC~~ SOLN
0.0000 [IU] | Freq: Three times a day (TID) | SUBCUTANEOUS | Status: DC
  Administered 2015-05-05: 8 [IU] via SUBCUTANEOUS
  Administered 2015-05-05: 3 [IU] via SUBCUTANEOUS
  Administered 2015-05-05: 11 [IU] via SUBCUTANEOUS
  Administered 2015-05-06: 3 [IU] via SUBCUTANEOUS
  Administered 2015-05-06: 2 [IU] via SUBCUTANEOUS
  Administered 2015-05-06: 11 [IU] via SUBCUTANEOUS
  Administered 2015-05-07: 8 [IU] via SUBCUTANEOUS

## 2015-05-05 MED ORDER — ACETAMINOPHEN 650 MG RE SUPP: 650.0000 mg | Freq: Four times a day (QID) | RECTAL | Status: DC | PRN

## 2015-05-05 NOTE — Progress Notes (Signed)
[facility] PROGRESS NOTE   [name]. [id number] DOB: 11/01/1951 DOA: 05/04/2015 PCP: [name], OLUGBEMIGA, [name]  HPI/Subjective: Denies fever and chills. Has significant shortness of breath and orthopnea. Cough and yellow colored sputum production.  Assessment/Plan: Principal Problem:   COPD exacerbation Active Problems:   Essential hypertension   Coronary atherosclerosis   Acute on chronic respiratory failure with hypoxia   Bilateral lower extremity edema   Diabetes   Acute on chronic diastolic CHF (congestive heart failure)   Weakness of both legs   History of CHF (congestive heart failure)   Other chest pain    Acute COPD exacerbation, patient started on antibiotics and steroids. Supportive respiratory management with bronchodilators, mucolytics and antitussives. Acute on chronic respiratory failure, patient on 3 L of oxygen at home. History of CHF was preserved LV function, a last echo LVEF of 55%. Patient has lower extremity edema, started on diuresis with IV Lasix.   Code Status: Full Code Family Communication: Plan discussed with the patient. Disposition Plan: Remains inpatient Diet: Diet heart healthy/carb modified Room service appropriate?: Yes; Fluid consistency:: Thin  Consultants:  None  Procedures:  None  Antibiotics:  Rocephin and azithromycin   Objective: Filed Vitals:   05/05/15 0417  BP: 115/69  Pulse: 89  Temp: 97.7 F (36.5 C)  Resp: 20    Intake/Output Summary (Last 24 hours) at 05/05/15 1235 Last data filed at 05/05/15 1038  Gross per 24 hour  Intake   1018 ml  Output   1400 ml  Net   -382 ml   Filed Weights   05/05/15 0526  Weight: 85.503 kg (188 lb 8 oz)    Exam: General: Alert and awake, oriented x3, not in any acute distress. HEENT: anicteric sclera, pupils reactive to light and accommodation, EOMI CVS: S1-S2 clear, no murmur rubs or gallops Chest: clear to auscultation bilaterally, no wheezing,  rales or rhonchi Abdomen: soft nontender, nondistended, normal bowel sounds, no organomegaly Extremities: no cyanosis, clubbing or edema noted bilaterally Neuro: Cranial nerves II-XII intact, no focal neurological deficits  Data Reviewed: Basic Metabolic Panel:  Recent Labs Lab 05/04/15 2318  NA 137  K 4.3  CL 96*  CO2 32  GLUCOSE 206*  BUN 15  CREATININE 0.57*  CALCIUM 8.4*   Liver Function Tests: No results for input(s): AST, ALT, ALKPHOS, BILITOT, PROT, ALBUMIN in the last 168 hours. No results for input(s): LIPASE, AMYLASE in the last 168 hours. No results for input(s): AMMONIA in the last 168 hours. CBC:  Recent Labs Lab 05/04/15 2318  WBC 9.2  HGB 8.7*  HCT 29.2*  MCV 93.6  PLT 205   Cardiac Enzymes:  Recent Labs Lab 05/05/15 0558  TROPONINI <0.03   BNP (last 3 results)  Recent Labs  05/04/15 2318  BNP 136.0*    ProBNP (last 3 results)  Recent Labs  10/27/14 2311  PROBNP 89.0    CBG:  Recent Labs Lab 05/05/15 0614 05/05/15 1100  GLUCAP 277* 157*    Micro No results found for this or any previous visit (from the past 240 hour(s)).   Studies: Dg Chest Portable 1 View  05/05/2015   CLINICAL DATA:  Chest pain and shortness of breath.  EXAM: PORTABLE CHEST - 1 VIEW  COMPARISON:  04/21/2015  FINDINGS: Lungs remain hyperinflated. The cardiomediastinal contours are unchanged, heart at the upper limits of normal. Bibasilar atelectasis or scarring is again seen. Pulmonary vasculature is normal. No consolidation, pleural effusion, or pneumothorax. No acute osseous abnormalities  are seen.  IMPRESSION: Unchanged hyperinflation without acute pulmonary process.   Electronically Signed   By: Melanie  Ehinger M.D.   On: 05/05/2015 00:45    Scheduled Meds: . albuterol  2.5Rubye Oaks mg Nebulization QID  . antiseptic oral rinse  7 mL Mouth Rinse BID  . aspirin  325 mg Oral q morning - 10a  . clopidogrel  75 mg Oral Daily  . enoxaparin (LOVENOX) injection  40  mg Subcutaneous Q24H  . furosemide  40 mg Intravenous Q12H  . insulin aspart  0-15 Units Subcutaneous TID WC  . insulin aspart  0-5 Units Subcutaneous QHS  . lisinopril  2.5 mg Oral Daily  . metoprolol  50 mg Oral BID  . nitroGLYCERIN  0.5 inch Topical 4 times per day  . potassium chloride  20 mEq Oral Daily  . sodium chloride  3 mL Intravenous Q12H  . sodium chloride  3 mL Intravenous Q12H  . sodium chloride  3 mL Intravenous Q12H   Continuous Infusions:      Time spent: 35 minutes    [facility] Center [facility] IncELMAHI,[name]  [facility] Pager [phone] If 7PM-7AM, please contact night-coverage at [email or web address], password [email or web address] 05/05/2015, 12:35 PM  LOS: 0 days

## 2015-05-05 NOTE — Progress Notes (Signed)
Utilization review completed. Lorenzo Pereyra, RN, BSN. 

## 2015-05-05 NOTE — Progress Notes (Signed)
Patient states that he wears 3-4L at home.

## 2015-05-05 NOTE — ED Notes (Signed)
Lowered back of bed for patient comfort.

## 2015-05-05 NOTE — Progress Notes (Signed)
PT Cancellation Note  Patient Details Name: Chris BandaUlysses G Marmolejos Jr. MRN: [id number] DOB: 03/22/1951   Cancelled Treatment:    Reason Eval/Treat Not Completed: Patient declined.  Attempted to see pt twice, but pt politely declined, stating "my oxygen is dropping too much right now."  Will check back as schedule permits.   [name] 05/05/2015, 12:39 PM

## 2015-05-05 NOTE — H&P (Signed)
[facility] Admission History and Physical       Chris BandaUlysses G Glick Jr. WUJ:[id number] DOB: 05/28/1951 DOA: 05/04/2015  Referring physician: EDP PCP: Jeanann LewandowskyJEGEDE, OLUGBEMIGA, MD  Specialists:   Chief Complaint: SOB  HPI: Chris BandaUlysses G [name] Jr. is a 64 y.o. male with history of CAD, O2 dependent COPD, Diastolic CHF with EF 55%  on last 2D ECHO on 10/2014, HTN,  DM2 who resents to the ED with complaints of worsening SOB chest tightness and wheezing over the past 24 hours.  He denies any fevers or chills.   He does reports having chest pian and pain radiating into both of his shoulders.   He was treated with Duonebs, and IV Solumedrol x 1 along with 1 dose of IV Lasix and had mild improvement and was referred for medical admission.     Review of Systems:  Constitutional: No Weight Loss, No Weight Gain, Night Sweats, Fevers, Chills, Dizziness, Light Headedness, Fatigue, +Generalized Weakness HEENT: No Headaches, Difficulty Swallowing,Tooth/Dental Problems,Sore Throat,  No Sneezing, Rhinitis, Ear Ache, Nasal Congestion, or Post Nasal Drip,  Cardio-vascular:  +Chest pain, +Orthopnea, PND, Edema in Lower Extremities, Anasarca, Dizziness, Palpitations  Resp:  +Dyspnea, +DOE, No Productive Cough, No Non-Productive Cough, No Hemoptysis, +Wheezing.    GI: No Heartburn, Indigestion, Abdominal Pain, Nausea, Vomiting, Diarrhea, Constipation, Hematemesis, Hematochezia, Melena, Change in Bowel Habits,  Loss of Appetite  GU: No Dysuria, No Change in Color of Urine, No Urgency or Urinary Frequency, No Flank pain.  Musculoskeletal: No Joint Pain or Swelling, No Decreased Range of Motion, No Back Pain.  Neurologic: No Syncope, No Seizures, Muscle Weakness, Paresthesia, Vision Disturbance or Loss, No Diplopia, No Vertigo, No Difficulty Walking,  Skin: No Rash or Lesions. Psych: No Change in Mood or Affect, No Depression or Anxiety, No Memory loss, No Confusion, or Hallucinations   Past Medical History    Diagnosis Date  . COPD (chronic obstructive pulmonary disease)   . CAD (coronary artery disease)   . Hypertension   . Hyperlipidemia   . Osteomyelitis   . Myocardial infarction     "I've had a couple since 2006" (12/26/2013)  . Anginal pain   . Asthma   . Pneumonia     "have had it several times; maybe now too": (12/26/2013)  . Shortness of breath     "all the time" (12/26/2013)  . On home oxygen therapy     "2-3L; 24/7" (12/26/2013)  . GERD (gastroesophageal reflux disease)   . Daily headache   . Stroke     "they say I've had a couple" denies residual on 12/26/2013  . Arthritis     "hands; feet" (12/26/2013)  . Osteomyelitis hip     "right"  . Chronic lower back pain      Past Surgical History  Procedure Laterality Date  . Appendectomy  1963  . Cardiac catheterization  ? 2009; ?2012  . Coronary angioplasty with stent placement  2006; ?2008; ?2010    "2 + 2 + 1"      Prior to Admission medications   Medication Sig Start Date End Date Taking? Authorizing Provider  albuterol (PROVENTIL) (2.5 MG/3ML) 0.083% nebulizer solution Take 3 mLs (2.5 mg total) by nebulization every 6 (six) hours as needed for wheezing or shortness of breath. 11/04/14  Yes Shanker Levora DredgeM Ghimire, MD  aspirin 325 MG tablet Take 1 tablet (325 mg total) by mouth every morning. 11/04/14  Yes Shanker Levora DredgeM Ghimire, MD  clopidogrel (PLAVIX) 75 MG tablet Take 1 tablet (  75 mg total) by mouth daily. 11/04/14  Yes Shanker Levora Dredge, MD  fluticasone (FLONASE) 50 MCG/ACT nasal spray Place 2 sprays into both nostrils 2 (two) times daily. 11/04/14  Yes Shanker Levora Dredge, MD  furosemide (LASIX) 40 MG tablet Take 40 mg by mouth daily.   Yes Historical Provider, MD  ipratropium-albuterol (DUONEB) 0.5-2.5 (3) MG/3ML SOLN Take 3 mLs by nebulization every 4 (four) hours as needed (sob).   Yes Historical Provider, MD  metFORMIN (GLUCOPHAGE) 500 MG tablet Take 1 tablet (500 mg total) by mouth 2 (two) times daily with a meal. 11/04/14  Yes  Shanker Levora Dredge, MD  metoprolol (LOPRESSOR) 50 MG tablet Take 1 tablet (50 mg total) by mouth 2 (two) times daily. 11/04/14  Yes Shanker Levora Dredge, MD  Oxycodone-Acetaminophen (PERCOCET PO) Take 1-2 tablets by mouth every 6 (six) hours as needed (pain).   Yes Historical Provider, MD  pantoprazole (PROTONIX) 40 MG tablet Take 1 tablet (40 mg total) by mouth daily at 12 noon. 11/04/14  Yes Shanker Levora Dredge, MD  potassium chloride SA (K-DUR,KLOR-CON) 20 MEQ tablet Take 1 tablet (20 mEq total) by mouth daily. 11/04/14  Yes Shanker Levora Dredge, MD  predniSONE (DELTASONE) 10 MG tablet Take 3 pills (30 mg) daily 01/23/15  Yes Leslye Peer, MD  terbutaline (BRETHINE) 2.5 MG tablet Take 1 tablet (2.5 mg total) by mouth every 6 (six) hours. 11/04/14  Yes Shanker Levora Dredge, MD  traMADol (ULTRAM) 50 MG tablet Take 1 tablet (50 mg total) by mouth every 6 (six) hours as needed (for breakthrough pain). 11/04/14  Yes Shanker Levora Dredge, MD  traZODone (DESYREL) 50 MG tablet Take 1 tablet (50 mg total) by mouth at bedtime as needed for sleep. 11/04/14  Yes Shanker Levora Dredge, MD  albuterol (PROVENTIL HFA;VENTOLIN HFA) 108 (90 BASE) MCG/ACT inhaler Inhale 2 puffs into the lungs every 4 (four) hours as needed for wheezing or shortness of breath. Patient not taking: Reported on 05/05/2015 11/04/14   Maretta Bees, MD  albuterol (PROVENTIL) (2.5 MG/3ML) 0.083% nebulizer solution Take 3 mLs (2.5 mg total) by nebulization every 6 (six) hours as needed for wheezing or shortness of breath. Dx 496 11/04/14   Shanker Levora Dredge, MD  arformoterol (BROVANA) 15 MCG/2ML NEBU Take 2 mLs (15 mcg total) by nebulization 2 (two) times daily. Patient not taking: Reported on 05/05/2015 11/04/14   Maretta Bees, MD  furosemide (LASIX) 20 MG tablet TAKE 1 TABLET (20 MG TOTAL) BY MOUTH DAILY. 09/13/14   Leslye Peer, MD  furosemide (LASIX) 20 MG tablet Take 2 tablets (40 mg total) by mouth daily. 11/04/14   Shanker Levora Dredge, MD    ipratropium (ATROVENT) 0.02 % nebulizer solution Take 2.5 mLs (0.5 mg total) by nebulization every 4 (four) hours as needed for wheezing or shortness of breath. 12/27/14   Leslye Peer, MD  mometasone-formoterol (DULERA) 100-5 MCG/ACT AERO Inhale 2 puffs into the lungs 2 (two) times daily. Patient not taking: Reported on 05/05/2015 11/20/14   Quentin Angst, MD  nystatin (MYCOSTATIN) 100000 UNIT/ML suspension Take 5 mLs (500,000 Units total) by mouth 4 (four) times daily. Patient not taking: Reported on 05/05/2015 01/09/14   Alba Cory, MD     Allergies  Allergen Reactions  . Lipitor [Atorvastatin Calcium] Other (See Comments)    Muscle aches    Social History:  reports that he quit smoking about 7 months ago. His smoking use included Cigarettes. He has a 12.75  pack-year smoking history. He has never used smokeless tobacco. He reports that he does not drink alcohol or use illicit drugs.    Family History  Problem Relation Age of Onset  . Coronary artery disease    . Hypertension         Physical Exam:  GEN:  Pleasant Obese Elderly 64 y.o. Caucasian male examined and in no acute distress; cooperative with exam Filed Vitals:   05/05/15 0231 05/05/15 0330 05/05/15 0417 05/05/15 0443  BP: 123/71 115/66 115/69   Pulse: 91 84 89   Temp:   97.7 F (36.5 C)   TempSrc:   Oral   Resp: 15 17 20    Height:    5\' 11"  (1.803 m)  SpO2: 96% 100% 99%    Blood pressure 115/69, pulse 89, temperature 97.7 F (36.5 C), temperature source Oral, resp. rate 20, height 5\' 11"  (1.803 m), SpO2 99 %. PSYCH: He is alert and oriented x4; does not appear anxious does not appear depressed; affect is normal HEENT: Normocephalic and Atraumatic, Mucous membranes pink; PERRLA; EOM intact; Fundi:  Benign;  No scleral icterus, Nares: Patent, Oropharynx: Clear,     Neck:  FROM, No Cervical Lymphadenopathy nor Thyromegaly or Carotid Bruit; No JVD; Breasts:: Not examined CHEST WALL: No tenderness CHEST:  Normal respiration, clear to auscultation bilaterally HEART: Regular rate and rhythm; no murmurs rubs or gallops BACK: No kyphosis or scoliosis; No CVA tenderness ABDOMEN: Positive Bowel Sounds, Obese, Soft Non-Tender, No Rebound or Guarding; No Masses, No Organomegaly. Rectal Exam: Not done EXTREMITIES: No  Cyanosis, Clubbing, 2+ BLE Edema; No Ulcerations. Genitalia: not examined PULSES: 2+ and symmetric SKIN: Normal hydration no rash or ulceration CNS:  Alert and Oriented x 4, No Focal Deficits Vascular: pulses palpable throughout    Labs on Admission:  Basic Metabolic Panel:  Recent Labs Lab 05/04/15 2318  NA 137  K 4.3  CL 96*  CO2 32  GLUCOSE 206*  BUN 15  CREATININE 0.57*  CALCIUM 8.4*   Liver Function Tests: No results for input(s): AST, ALT, ALKPHOS, BILITOT, PROT, ALBUMIN in the last 168 hours. No results for input(s): LIPASE, AMYLASE in the last 168 hours. No results for input(s): AMMONIA in the last 168 hours. CBC:  Recent Labs Lab 05/04/15 2318  WBC 9.2  HGB 8.7*  HCT 29.2*  MCV 93.6  PLT 205   Cardiac Enzymes: No results for input(s): CKTOTAL, CKMB, CKMBINDEX, TROPONINI in the last 168 hours.  BNP (last 3 results)  Recent Labs  05/04/15 2318  BNP 136.0*    ProBNP (last 3 results)  Recent Labs  10/27/14 2311  PROBNP 89.0    CBG: No results for input(s): GLUCAP in the last 168 hours.  Radiological Exams on Admission: Dg Chest Portable 1 View  05/05/2015   CLINICAL DATA:  Chest pain and shortness of breath.  EXAM: PORTABLE CHEST - 1 VIEW  COMPARISON:  04/21/2015  FINDINGS: Lungs remain hyperinflated. The cardiomediastinal contours are unchanged, heart at the upper limits of normal. Bibasilar atelectasis or scarring is again seen. Pulmonary vasculature is normal. No consolidation, pleural effusion, or pneumothorax. No acute osseous abnormalities are seen.  IMPRESSION: Unchanged hyperinflation without acute pulmonary process.   Electronically  Signed   By: Rubye OaksMelanie  Ehinger M.D.   On: 05/05/2015 00:45     EKG: Independently reviewed. Normal Sinus Rhythm Rate =86   Assessment/Plan:   64 y.o. male with  Principal Problem:   1.   COPD exacerbation   DuoNebs  IV steroids   Monitor O2 Sats   Continue NCO2  Active Problems:   2.   Acute on chronic respiratory failure with hypoxia   NCO2   Monitor O2 sats      3.   Acute on chronic diastolic CHF (congestive heart failure)   Acute CHF protocol ordered Diurese with IV Lasix   Last 2D ECHO was in 10/2014   Continue Metorpolol Rx,     Add low dose Lisinopril Rx as BP tolerates   Monitor I/Os and daily weights   Monitor K+ levels     4.   Chest Pain   Telemetry Monitoring   Cycle Troponins     5.   Essential hypertension   Continue Metoprolol Rx   Monitor BPs     6.   Coronary atherosclerosis   Continue Plavix, ASA and Metoprolol Rx   Note: allergy to Statin Rx      7.   Bilateral lower extremity edema- due to #3   Diurese with IV Lasix   Elevate BLEs of bed     8.   Diabetes   Hold metformin Rx   SSI coverage PRN   Check HbA1C in AM     9.   Weakness of both legs   Physical Therapy Evaluation   10.   DVT Prophylaxis    Lovenox         Code Status:     FULL CODE       Family Communication:      No Family Present    Disposition Plan:    Inpatient   Status        Time spent:  63 Minutes      Ron Parker [facility] Pager [phone]   If 7AM -7PM Please Contact the Day Rounding Team MD for [facility]  If 7PM-7AM, Please Contact Night-Floor Coverage  [email or web address] Password [email or web address] 05/05/2015, 5:34 AM     ADDENDUM:   Patient was seen and examined on 05/05/2015

## 2015-05-06 ENCOUNTER — Other Ambulatory Visit: Payer: Self-pay | Admitting: Internal Medicine

## 2015-05-06 DIAGNOSIS — E118 Type 2 diabetes mellitus with unspecified complications: Secondary | ICD-10-CM

## 2015-05-06 DIAGNOSIS — I2510 Atherosclerotic heart disease of native coronary artery without angina pectoris: Secondary | ICD-10-CM

## 2015-05-06 DIAGNOSIS — I10 Essential (primary) hypertension: Secondary | ICD-10-CM

## 2015-05-06 DIAGNOSIS — R29898 Other symptoms and signs involving the musculoskeletal system: Secondary | ICD-10-CM

## 2015-05-06 LAB — GLUCOSE, CAPILLARY
GLUCOSE-CAPILLARY: 317 mg/dL — AB (ref 65–99)
Glucose-Capillary: 136 mg/dL — ABNORMAL HIGH (ref 65–99)
Glucose-Capillary: 178 mg/dL — ABNORMAL HIGH (ref 65–99)
Glucose-Capillary: 188 mg/dL — ABNORMAL HIGH (ref 65–99)

## 2015-05-06 LAB — CBC
HEMATOCRIT: 26.9 % — AB (ref 39.0–52.0)
HEMOGLOBIN: 8.2 g/dL — AB (ref 13.0–17.0)
MCH: 28.3 pg (ref 26.0–34.0)
MCHC: 30.5 g/dL (ref 30.0–36.0)
MCV: 92.8 fL (ref 78.0–100.0)
Platelets: 203 10*3/uL (ref 150–400)
RBC: 2.9 MIL/uL — ABNORMAL LOW (ref 4.22–5.81)
RDW: 17.5 % — ABNORMAL HIGH (ref 11.5–15.5)
WBC: 8.3 10*3/uL (ref 4.0–10.5)

## 2015-05-06 LAB — BASIC METABOLIC PANEL
Anion gap: 7 (ref 5–15)
BUN: 16 mg/dL (ref 6–20)
CO2: 39 mmol/L — ABNORMAL HIGH (ref 22–32)
CREATININE: 0.67 mg/dL (ref 0.61–1.24)
Calcium: 9.1 mg/dL (ref 8.9–10.3)
Chloride: 95 mmol/L — ABNORMAL LOW (ref 101–111)
GFR calc Af Amer: >60 mL/min (ref >60)
GFR calc non Af Amer: >60 mL/min (ref >60)
Glucose, Bld: 133 mg/dL — ABNORMAL HIGH (ref 65–99)
Potassium: 5.1 mmol/L (ref 3.5–5.1)
Sodium: 141 mmol/L (ref 135–145)

## 2015-05-06 MED ORDER — TRAMADOL HCL 50 MG PO TABS
50.0000 mg | ORAL_TABLET | Freq: Four times a day (QID) | ORAL | Status: DC | PRN
  Administered 2015-05-06 – 2015-05-07 (×3): 50 mg via ORAL
  Filled 2015-05-06 (×3): qty 1

## 2015-05-06 MED ORDER — DEXTROSE 5 % IV SOLN
500.0000 mg | INTRAVENOUS | Status: DC
  Administered 2015-05-06 – 2015-05-08 (×3): 500 mg via INTRAVENOUS
  Filled 2015-05-06 (×3): qty 500

## 2015-05-06 MED ORDER — METHYLPREDNISOLONE SODIUM SUCC 125 MG IJ SOLR
80.0000 mg | Freq: Three times a day (TID) | INTRAMUSCULAR | Status: DC
  Administered 2015-05-06 – 2015-05-08 (×7): 80 mg via INTRAVENOUS
  Filled 2015-05-06: qty 1.28
  Filled 2015-05-06: qty 2
  Filled 2015-05-06 (×4): qty 1.28
  Filled 2015-05-06: qty 2
  Filled 2015-05-06: qty 1.28
  Filled 2015-05-06: qty 2

## 2015-05-06 MED ORDER — GUAIFENESIN ER 600 MG PO TB12
1200.0000 mg | ORAL_TABLET | Freq: Two times a day (BID) | ORAL | Status: DC
  Administered 2015-05-06 – 2015-05-17 (×16): 1200 mg via ORAL
  Filled 2015-05-06 (×23): qty 2

## 2015-05-06 MED ORDER — FUROSEMIDE 10 MG/ML IJ SOLN
40.0000 mg | Freq: Two times a day (BID) | INTRAMUSCULAR | Status: DC
  Administered 2015-05-06 – 2015-05-12 (×12): 40 mg via INTRAVENOUS
  Filled 2015-05-06 (×15): qty 4

## 2015-05-06 MED ORDER — FUROSEMIDE 10 MG/ML IJ SOLN
40.0000 mg | Freq: Once | INTRAMUSCULAR | Status: DC
  Administered 2015-05-06: 40 mg via INTRAVENOUS

## 2015-05-06 MED ORDER — METHYLPREDNISOLONE SODIUM SUCC 125 MG IJ SOLR
125.0000 mg | Freq: Once | INTRAMUSCULAR | Status: AC
  Administered 2015-05-06: 125 mg via INTRAVENOUS
  Filled 2015-05-06: qty 2

## 2015-05-06 MED ORDER — MORPHINE SULFATE 2 MG/ML IJ SOLN
1.0000 mg | INTRAMUSCULAR | Status: DC | PRN
  Administered 2015-05-06: 1 mg via INTRAVENOUS
  Filled 2015-05-06: qty 1

## 2015-05-06 MED ORDER — IPRATROPIUM-ALBUTEROL 0.5-2.5 (3) MG/3ML IN SOLN
3.0000 mL | RESPIRATORY_TRACT | Status: DC
  Administered 2015-05-06 – 2015-05-12 (×33): 3 mL via RESPIRATORY_TRACT
  Filled 2015-05-06 (×35): qty 3

## 2015-05-06 MED ORDER — IPRATROPIUM-ALBUTEROL 0.5-2.5 (3) MG/3ML IN SOLN
3.0000 mL | RESPIRATORY_TRACT | Status: DC | PRN
  Administered 2015-05-06 – 2015-05-12 (×5): 3 mL via RESPIRATORY_TRACT
  Filled 2015-05-06 (×3): qty 3

## 2015-05-06 MED ORDER — HYDROMORPHONE HCL 1 MG/ML IJ SOLN
1.0000 mg | INTRAMUSCULAR | Status: DC | PRN
  Administered 2015-05-06 – 2015-05-12 (×27): 1 mg via INTRAVENOUS
  Filled 2015-05-06 (×28): qty 1

## 2015-05-06 MED ORDER — CEFTRIAXONE SODIUM IN DEXTROSE 20 MG/ML IV SOLN
1.0000 g | INTRAVENOUS | Status: DC
  Administered 2015-05-06 – 2015-05-08 (×3): 1 g via INTRAVENOUS
  Filled 2015-05-06 (×4): qty 50

## 2015-05-06 NOTE — Progress Notes (Signed)
Pt has been in respiratory distress all morning. MD notified multiple times and few orders written. Asked for anti-anxiety medications while pt vitals were stable to help relax pt, but no success. Pt given steroids and lasix, but no changes with pt status. Pt continued to struggle with respiratory, nurse tech and nurse at bedside every few minutes. Rapid Response notified of pt status and will continue to monitor pt closely. Jillyn HiddenStone,Roey Coopman R, RN

## 2015-05-06 NOTE — Evaluation (Signed)
Physical Therapy Evaluation Patient Details Name: [name] MRN: [id number] DOB: 09/29/1951 Today's Date: 05/06/2015   History of Present Illness  Margit BandaUlysses G Ledesma Jr. is a 64 y.o. male with history of CAD, O2 dependent COPD, Diastolic CHF with EF 55% on last 2D ECHO on 10/2014, HTN, DM2 who resents to the ED with complaints of worsening SOB chest tightness and wheezing  x 1 day.  Clinical Impression  Pt admitted with above diagnosis. Pt currently with functional limitations due to the deficits listed below (see PT Problem List).  Pt will benefit from skilled PT to increase their independence and safety with mobility to allow discharge to the venue listed below.  Evaluation limited by pt's anxiety and SOB/dyspnea.  Repetitive multiple transfers supine <> sit and multiple sit <> stands and multiple SPT.  Unable to attempt gait.  Feel that once pt is breathing better he will be ok to go home with HHPT.  o2 fluctuated during session 84-99% with HR low 150s. May benefit from w/c for longer distances if need be.     Follow Up Recommendations Home health PT    Equipment Recommendations  Wheelchair (measurements PT) (possibly?)    Recommendations for Other Services       Precautions / Restrictions Precautions Precaution Comments: o2 dependent Restrictions Weight Bearing Restrictions: No      Mobility  Bed Mobility Overal bed mobility: Needs Assistance Bed Mobility: Sit to Supine;Supine to Sit     Supine to sit: Min assist Sit to supine: Min assist   General bed mobility comments: A for legs to get supine and MIN A of pulling on PT's hand to get trunk up for supine >sit.  HOB elevated throughout these transfers. Pt appeared to have decreased memeory, as he would lay down find it uncomfortable, sit back up , but 2 mins later want to lay back down again, multiple reps.  Transfers Overall transfer level: Needs assistance   Transfers: Sit to/from Stand;Stand Pivot Transfers Sit to  Stand: Min assist Stand pivot transfers: Min assist       General transfer comment: [facility] then back to sitting EOB, then to recliner. Pt with heavy use of arms on arms of chair and flexed posture. Pt very fidgety and anxious.  Ambulation/Gait                Stairs            Wheelchair Mobility    Modified Rankin (Stroke Patients Only)       Balance Overall balance assessment: Needs assistance Sitting-balance support: Feet supported Sitting balance-Leahy Scale: Good       Standing balance-Leahy Scale: Fair                               Pertinent Vitals/Pain Pain Assessment: Faces Faces Pain Scale: Hurts little more Pain Location: back Pain Descriptors / Indicators: Aching    Home Living Family/patient expects to be discharged to:: Private residence Living Arrangements: Other relatives (sister) Available Help at Discharge: Available 24 hours/day Type of Home: House Home Access: Level entry     Home Layout: One level Home Equipment: Cane - single point;Walker - 2 wheels      Prior Function Level of Independence: Independent with assistive device(s)         Comments: AMb with RW     Hand Dominance        Extremity/Trunk Assessment  Upper Extremity Assessment: Generalized weakness           Lower Extremity Assessment: Generalized weakness      Cervical / Trunk Assessment: Normal  Communication      Cognition Arousal/Alertness: Awake/alert Behavior During Therapy: Anxious Overall Cognitive Status: No family/caregiver present to determine baseline cognitive functioning       Memory: Decreased short-term memory              General Comments General comments (skin integrity, edema, etc.): Pt very fidgety and anxious.  Constantly telling PT to turn up his o2.  When pt "relaxed" o2 in upper 90's on 3 L/min. At other times, it did drop to low-mid 80's but fluctuating throughout session.     Exercises        Assessment/Plan    PT Assessment Patient needs continued PT services  PT Diagnosis Difficulty walking   PT Problem List Cardiopulmonary status limiting activity;Decreased activity tolerance;Decreased balance;Decreased mobility  PT Treatment Interventions Gait training;Functional mobility training;Therapeutic activities;Therapeutic exercise;Balance training   PT Goals (Current goals can be found in the Care Plan section) Acute Rehab PT Goals Patient Stated Goal: To breathe better. PT Goal Formulation: With patient Time For Goal Achievement: 05/20/15 Potential to Achieve Goals: Good    Frequency Min 3X/week   Barriers to discharge        Co-evaluation               End of Session   Activity Tolerance: Patient limited by fatigue Patient left: in chair;with call bell/phone within reach Nurse Communication: Mobility status;Other (comment) (anxiety and o2 )         Time: [id number] PT Time Calculation (min) (ACUTE ONLY): 32 min   Charges:   PT Evaluation $Initial PT Evaluation Tier I: 1 Procedure PT Treatments $Therapeutic Activity: 8-22 mins   PT G Codes:        Tallie Hevia LUBECK 05/06/2015, 9:51 AM

## 2015-05-06 NOTE — Progress Notes (Signed)
Notified Dr. Arthor CaptainElmahi on the floor that pt needs ativan and still with difficulty breathing.  Insttucted to continue with breathing tx as ordered and no ativan at this time.  Will continue to monitor.  Earlena Werst,RN

## 2015-05-06 NOTE — Progress Notes (Signed)
RN called d/t pt SOB, Rapid Response RN at bedside, HHN tx already started by RN.  BBSH diminished w/ exp. Wheezes.

## 2015-05-06 NOTE — Progress Notes (Signed)
[facility] PROGRESS NOTE   Haig ProphetUlysses G Stites BJY:[id number] DOB: 12/23/1950 DOA: 05/04/2015 PCP: Jeanann LewandowskyJEGEDE, OLUGBEMIGA, MD  HPI/Subjective: Seen this morning is nursing staff at bedside, patient is short of breath, moderate respiratory distress. For some reason the steroids were not continued. Restarted steroids, DuoNeb every 2 hours when necessary, give extra dose of Lasix. Nursing staff asked about benzodiazepines, hold giving any sedating medications as patient oxygen saturation is low 90s on 3 L of oxygen.  Assessment/Plan: Principal Problem:   COPD exacerbation Active Problems:   Essential hypertension   Coronary atherosclerosis   Acute on chronic respiratory failure with hypoxia   Bilateral lower extremity edema   Diabetes   Acute on chronic diastolic CHF (congestive heart failure)   Weakness of both legs   History of CHF (congestive heart failure)   Other chest pain    Acute on chronic respiratory failure with hypoxia Came into the hospital complaining about worsening shortness of breath, wheezing and more oxygen requirement. This is likely secondary to COPD exacerbation and acute on chronic diastolic CHF. Required 4 L of oxygen on admission currently needs 3 L of oxygen. Continue current respiratory management.  Acute COPD exacerbation Presented with shortness of breath, wheezing and cough as well as sputum production. Placed on antibiotics and steroids, apparently steroids were not continued yesterday after the initial dose in the ED. Restarted the steroids that 80 mg every 8 hours, continue antibiotics, bronchodilators, mucolytics and antitussives.  Acute on chronic diastolic CHF CHF with preserved LVEF, last LVEF was 55% from November 2015, obtain another echocardiogram. Patient presented with shortness of breath, lower extremity edema and slightly elevated BNP at 136. Started on Lasix, continue, sodium restriction and 1200 fluid restriction. Daily weight,  follow intake/output.  Chest pain Likely pleuritic from cough and respiratory distress, patient has history of CAD. 3 sets of cardiac enzymes were negative, the fourth was slightly positive at 0.04, likely secondary to CHF. Check 2-D echo, rule out new WMA.  Diabetes mellitus2 Less hemoglobin A1c was 7.4, patient is on Glucophage 500 mg twice a day. Hold oral medications, insulin sliding scale, likely CBGs will increase with a steroids.  Weakness Patient has generalized weakness especially his legs, PT to evaluate after respiratory status improves.  Code Status: Full Code Family Communication: Plan discussed with the patient. Disposition Plan: Remains inpatient Diet: Diet heart healthy/carb modified Room service appropriate?: Yes; Fluid consistency:: Thin  Consultants:  None  Procedures:  None  Antibiotics:  Rocephin and azithromycin   Objective: Filed Vitals:   05/06/15 0649  BP: 124/66  Pulse: 114  Temp: 98.2 F (36.8 C)  Resp: 21    Intake/Output Summary (Last 24 hours) at 05/06/15 1128 Last data filed at 05/06/15 1001  Gross per 24 hour  Intake   1358 ml  Output   2650 ml  Net  -1292 ml   Filed Weights   05/05/15 0526 05/06/15 0500 05/06/15 0649  Weight: 85.503 kg (188 lb 8 oz) 81.012 kg (178 lb 9.6 oz) 84.913 kg (187 lb 3.2 oz)    Exam: General: Alert and awake, oriented x3, not in any acute distress. HEENT: anicteric sclera, pupils reactive to light and accommodation, EOMI CVS: S1-S2 clear, no murmur rubs or gallops Chest: clear to auscultation bilaterally, no wheezing, rales or rhonchi Abdomen: soft nontender, nondistended, normal bowel sounds, no organomegaly Extremities: no cyanosis, clubbing or edema noted bilaterally Neuro: Cranial nerves II-XII intact, no focal neurological deficits  Data Reviewed: Basic Metabolic Panel:  Recent Labs  Lab 05/04/15 2318 05/06/15 0235  NA 137 141  K 4.3 5.1  CL 96* 95*  CO2 32 39*  GLUCOSE 206* 133*    BUN 15 16  CREATININE 0.57* 0.67  CALCIUM 8.4* 9.1   Liver Function Tests: No results for input(s): AST, ALT, ALKPHOS, BILITOT, PROT, ALBUMIN in the last 168 hours. No results for input(s): LIPASE, AMYLASE in the last 168 hours. No results for input(s): AMMONIA in the last 168 hours. CBC:  Recent Labs Lab 05/04/15 2318 05/06/15 0235  WBC 9.2 8.3  HGB 8.7* 8.2*  HCT 29.2* 26.9*  MCV 93.6 92.8  PLT 205 203   Cardiac Enzymes:  Recent Labs Lab 05/05/15 0558 05/05/15 1151 05/05/15 1820  TROPONINI <0.03 <0.03 0.04*   BNP (last 3 results)  Recent Labs  05/04/15 2318  BNP 136.0*    ProBNP (last 3 results)  Recent Labs  10/27/14 2311  PROBNP 89.0    CBG:  Recent Labs Lab 05/05/15 1100 05/05/15 1609 05/05/15 2141 05/06/15 0647 05/06/15 1112  GLUCAP 157* 328* 209* 136* 178*    Micro No results found for this or any previous visit (from the past 240 hour(s)).   Studies: Dg Chest Portable 1 View  05/05/2015   CLINICAL DATA:  Chest pain and shortness of breath.  EXAM: PORTABLE CHEST - 1 VIEW  COMPARISON:  04/21/2015  FINDINGS: Lungs remain hyperinflated. The cardiomediastinal contours are unchanged, heart at the upper limits of normal. Bibasilar atelectasis or scarring is again seen. Pulmonary vasculature is normal. No consolidation, pleural effusion, or pneumothorax. No acute osseous abnormalities are seen.  IMPRESSION: Unchanged hyperinflation without acute pulmonary process.   Electronically Signed   By: Rubye OaksMelanie  Ehinger M.D.   On: 05/05/2015 00:45    Scheduled Meds: . antiseptic oral rinse  7 mL Mouth Rinse BID  . aspirin  325 mg Oral q morning - 10a  . clopidogrel  75 mg Oral Daily  . enoxaparin (LOVENOX) injection  40 mg Subcutaneous Q24H  . furosemide  40 mg Intravenous BID  . insulin aspart  0-15 Units Subcutaneous TID WC  . insulin aspart  0-5 Units Subcutaneous QHS  . ipratropium-albuterol  3 mL Nebulization Q4H  . lisinopril  2.5 mg Oral Daily   . methylPREDNISolone (SOLU-MEDROL) injection  80 mg Intravenous 3 times per day  . metoprolol  50 mg Oral BID  . nitroGLYCERIN  0.5 inch Topical 4 times per day  . potassium chloride  20 mEq Oral Daily  . sodium chloride  3 mL Intravenous Q12H  . sodium chloride  3 mL Intravenous Q12H  . sodium chloride  3 mL Intravenous Q12H   Continuous Infusions:      Time spent: 35 minutes    [facility] Surgery CenterELMAHI,Mahad Newstrom A  [facility] Pager [phone] If 7PM-7AM, please contact night-coverage at [email or web address], password [email or web address] 05/06/2015, 11:28 AM  LOS: 1 day

## 2015-05-06 NOTE — Care Management Note (Signed)
Case Management Note  Patient Details  Name: [name] MRN: [id number] Date of Birth: 01/16/1951  Subjective/Objective:   Pt admitted on 05/04/15 with COPD exacerbation.  PTA, pt resides at home with sister.  He is on chronic home oxygen at 3-4L/[place].                  Action/Plan:  PT recommending HH follow up at dc.  [facility]   Expected Discharge Date:                  Expected Discharge Plan:  Home w Home Health Services  In-House Referral:     Discharge planning Services  CM Consult  Post Acute Care Choice:    Choice offered to:     DME Arranged:    DME Agency:     HH Arranged:    HH Agency:     Status of Service:  In process, will continue to follow  Medicare Important Message Given:    Date Medicare IM Given:    Medicare IM give by:    Date Additional Medicare IM Given:    Additional Medicare Important Message give by:     If discussed at Long Length of Stay Meetings, dates discussed:    Additional Comments:  Glennon Macmerson, Fardowsa Authier M, RN 05/06/2015, 4:57 PM Phone #[phone]

## 2015-05-06 NOTE — Significant Event (Signed)
Rapid Response Event Note  Overview: Called to assist with patient with ongoing resp distres Time Called: 1103 Arrival Time: 1110 Event Type: Respiratory  Initial Focused Assessment:  On arrival patient sitting on edge of bed - warm and dry - alert - able to speak full sentences - on n/c 4 liters - purse lip breathing - patient denies chest pain - states he has back pain which is chronic from "bad vertebrae".  Bil BS very diminished posterior = anterior with exp wheezing - some fine crackles.  RR 24 - moderate distress.  States he feels anxious and jittery - has had frequent nebulizers this AM for reported SOB.  He states he takes nebs every 2 hours at home - even at nighttime.  RN Herbert SetaHeather at bedside - reports he has had IV lasix 40 mg this AM and 125 mg Solumedrol IV per Dr. Arthor CaptainElmahi orders.  Patient states he is not much better - states sometimes he needs CAT neb treatment.  Bil feet 4+ pitting edema.  ST 134.   BP 135/72 O2 sats 93% on 4 liters - normal home O2 3 liters - states his O2 sats at MD office normally 92-94%.     Interventions: Support and reassurance given - patient to chair - tol well.  Feet elevated - high fowlers with pillows. Prn nebulizer treatment given.  Patient relaxing some - decreased purse lip breathing - states he really needs his pain meds from home.  Chart reviewed - Percocet tablet given per Herbert SetaHeather RN.  Patient resting in chair.  RT Sarah in - assessment - remains moderate distress - but some better.  Resting better in chair - 113/84 HR 114 RR 22 O2 sats 96% on 4 liters - weaned to 3.5 liters - will continue to home dose as tolerated.  Dr. Arthor CaptainElmahi in to reassess patient - sitting in chair - mild SOB - patient discussing meds with Dr.  Latina Craveremains on 3.5 liters - O2 sats 93%.  1245:  Reassesment :  Patient sitting in chair - HR 101 - talking on phone without problems - states he feels better - continue to ask for Ultram meds - RR 20-22 - O2 sats 96% on 3.5 liters - weaned to 3 - his  baseline.  Bil BS with mild exp wheeze - NAD.  Asking to eat - sat lunch tray up - no trouble with eating.  Handoff to Best BuyHeather RN.  Will follow as needed.     Event Summary: Name of Physician Notified: Dr. Arthor CaptainElmahi at  (pta RRT)    at          [place], [name]

## 2015-05-06 NOTE — Significant Event (Addendum)
Patient did have mild to moderate respiratory distress this morning with respiratory rate 21 and oxygen saturation in the low 90s on 3 L. Have seen the patient with nursing staff at bedside. I have ordered DuoNeb every 2 hours when necessary added to his scheduled every 4 hours DuoNeb. Instructed the nurse to give patient Lasix earlier, start Solu-Medrol right away. Appears to be COPD exacerbation involved as well, I will add antibiotics.  Nursing staff asked me 4 times about adding Xanax or Ativan as he having respiratory distress. Explained that probably his anxiety is because of his SOB/respiratory distress. Has patient has clear pathology (acute COPD exacerbation/acute on chronic CHF), according to my assessment patient needs treatment for his pathology rather than anxiolytic for now.  Clint LippsMutaz A Katriona Schmierer Pager: [phone]: [phone] 05/06/2015, 11:57 AM   Addendum: I reviewed the chart, noted that nursing staff placed a note after I wrote my note explaining that patient was in distress. Please review the response team note. Vitals as documented from their note BP 113/84, HR 114, RR 22, sat 96% on 4 L. Again patient was in mild to moderate distress, not "struggling" when evaluating him personally again he was not in distress. He was able to complete sentences, no use of accessory muscles, he is on 3 L of oxygen (that would he does at home). When rapid response called, the attending physician (me) was not notified. Again explained to the patient and to the nursing staff what he needs is treatment for acute COPD exacerbation and his CHF rather than anxiolytic which might decrease his respiratory drive. Continue current respiratory regimen with antibiotics, mucolytics, antitussives, oxygen, Lasix and CHF medicines.  Clint LippsMutaz A Zorina Mallin Pager: [phone]: [phone] 05/06/2015, 5:36 PM

## 2015-05-07 ENCOUNTER — Other Ambulatory Visit (HOSPITAL_COMMUNITY): Payer: Medicare Other

## 2015-05-07 ENCOUNTER — Inpatient Hospital Stay (HOSPITAL_COMMUNITY): Payer: Medicare Other

## 2015-05-07 DIAGNOSIS — E109 Type 1 diabetes mellitus without complications: Secondary | ICD-10-CM

## 2015-05-07 DIAGNOSIS — I509 Heart failure, unspecified: Secondary | ICD-10-CM

## 2015-05-07 LAB — GLUCOSE, CAPILLARY
GLUCOSE-CAPILLARY: 253 mg/dL — AB (ref 65–99)
GLUCOSE-CAPILLARY: 242 mg/dL — AB (ref 65–99)
GLUCOSE-CAPILLARY: 236 mg/dL — AB (ref 65–99)
GLUCOSE-CAPILLARY: 191 mg/dL — AB (ref 65–99)

## 2015-05-07 MED ORDER — INSULIN ASPART 100 UNIT/ML ~~LOC~~ SOLN
0.0000 [IU] | Freq: Three times a day (TID) | SUBCUTANEOUS | Status: DC
  Administered 2015-05-07 – 2015-05-08 (×3): 7 [IU] via SUBCUTANEOUS
  Administered 2015-05-08 (×2): 11 [IU] via SUBCUTANEOUS
  Administered 2015-05-09: 7 [IU] via SUBCUTANEOUS
  Administered 2015-05-09: 11 [IU] via SUBCUTANEOUS
  Administered 2015-05-09: 15 [IU] via SUBCUTANEOUS
  Administered 2015-05-10: 4 [IU] via SUBCUTANEOUS
  Administered 2015-05-10: 3 [IU] via SUBCUTANEOUS
  Administered 2015-05-10: 7 [IU] via SUBCUTANEOUS
  Administered 2015-05-11: 3 [IU] via SUBCUTANEOUS

## 2015-05-07 MED ORDER — INSULIN GLARGINE 100 UNIT/ML ~~LOC~~ SOLN
5.0000 [IU] | Freq: Every day | SUBCUTANEOUS | Status: DC
  Administered 2015-05-07: 5 [IU] via SUBCUTANEOUS
  Filled 2015-05-07 (×2): qty 0.05

## 2015-05-07 NOTE — Progress Notes (Signed)
Pt constantly asked for pain medication for his chronic back pain. Writer of this note is alternating pt IV and PO medication and asked pt his home pain management.  Explained to pt the effects of too much narcotics but pt still wanting to get any pain reliever at this time. Vital signs stable and in no acute distress. We will continue to monitor.

## 2015-05-07 NOTE — Progress Notes (Signed)
  Echocardiogram 2D Echocardiogram has been performed.  Aris EvertsRix, Devanee Pomplun A 05/07/2015, 12:35 PM

## 2015-05-07 NOTE — Progress Notes (Addendum)
Pt states he wishes to take Nitro paste once a day at most due to headaches. Tylenol not effective. Pt with hx of daily headaches, however. Will continue to monitor.

## 2015-05-07 NOTE — Progress Notes (Signed)
[facility] PROGRESS NOTE  Haig ProphetUlysses G Konicek ZOX:[id number] DOB: 12/30/1950 DOA: 05/04/2015 PCP: Jeanann LewandowskyJEGEDE, OLUGBEMIGA, MD  Assessment/Plan: #1 acute on chronic respiratory failure with hypoxia Likely multifactorial secondary to acute COPD exacerbation and acute on chronic diastolic heart failure. Patient required 4 L on admission currently at 3 L. Some clinical improvement since starting IV Lasix and IV steroids. Continue empiric antibiotics, IV diuretics, IV steroid taper, bronchodilators, nebulizer treatments, Mucinex. Follow.  #2 acute COPD exacerbation Some clinical improvement since starting IV steroids. Patient still with wheezing and some cough as well as sputum production. Continue empiric IV Rocephin IV azithromycin. Continue IV steroids. Continue bronchodilators, mucolytics, antitussives.  #3 acute on chronic diastolic CHF Some clinical improvement. 2-D echo with EF of 60-65% with no wall motion abnormalities. Grade 1 diastolic dysfunction. I/O equal -3.05 L during this hospitalization. Continue empiric IV Lasix, aspirin, metoprolol, lisinopril. Follow.  #4 chest pain Likely secondary to cough or respiratory distress. Patient history of coronary artery disease. Cardiac enzymes negative 3. 2-D echo with EF of 60-65% with no wall motion abnormalities. Patient currently asymptomatic. Continue current regimen.  #5 diabetes mellitus type 2 Last hemoglobin A1c was 7.4. Oral hypoglycemic agents on hold. Place on Lantus 5 units daily as patient is on steroids. Change sliding scale insulin to resistant scale. Follow.  #6 weakness PT following.  #7 prophylaxis Lovenox for DVT prophylaxis.  Code Status: Full Family Communication: Updated patient no family at bedside. Disposition Plan: Home when medically stable.   Consultants:  None  Procedures:  Chest x-ray 05/04/2015  2-D echo 05/07/2015    Antibiotics:  IV azithromycin 05/06/2015  IV Rocephin  05/06/2015  HPI/Subjective: Patient states his breathing has improved since yesterday. Patient still with some shortness of breath or wheezing. Patient denies any chest pain.  Objective: Filed Vitals:   05/07/15 2030  BP: 129/78  Pulse: 94  Temp: 97.8 F (36.6 C)  Resp: 19    Intake/Output Summary (Last 24 hours) at 05/07/15 2043 Last data filed at 05/07/15 2031  Gross per 24 hour  Intake   1860 ml  Output   1725 ml  Net    135 ml   Filed Weights   05/06/15 0500 05/06/15 0649 05/07/15 0532  Weight: 81.012 kg (178 lb 9.6 oz) 84.913 kg (187 lb 3.2 oz) 84.641 kg (186 lb 9.6 oz)    Exam:   General:  NAD  Cardiovascular: RRR  Respiratory: Expiratory wheezes. Poor to fair air movement.  Abdomen: Soft, nontender, nondistended, positive bowel sounds.  Musculoskeletal: No clubbing cyanosis. 1+ bilateral lower extremity edema.  Data Reviewed: Basic Metabolic Panel:  Recent Labs Lab 05/04/15 2318 05/06/15 0235  NA 137 141  K 4.3 5.1  CL 96* 95*  CO2 32 39*  GLUCOSE 206* 133*  BUN 15 16  CREATININE 0.57* 0.67  CALCIUM 8.4* 9.1   Liver Function Tests: No results for input(s): AST, ALT, ALKPHOS, BILITOT, PROT, ALBUMIN in the last 168 hours. No results for input(s): LIPASE, AMYLASE in the last 168 hours. No results for input(s): AMMONIA in the last 168 hours. CBC:  Recent Labs Lab 05/04/15 2318 05/06/15 0235  WBC 9.2 8.3  HGB 8.7* 8.2*  HCT 29.2* 26.9*  MCV 93.6 92.8  PLT 205 203   Cardiac Enzymes:  Recent Labs Lab 05/05/15 0558 05/05/15 1151 05/05/15 1820  TROPONINI <0.03 <0.03 0.04*   BNP (last 3 results)  Recent Labs  05/04/15 2318  BNP 136.0*    ProBNP (last 3 results)  Recent  Labs  10/27/14 2311  PROBNP 89.0    CBG:  Recent Labs Lab 05/06/15 1617 05/06/15 2207 05/07/15 0558 05/07/15 1122 05/07/15 1614  GLUCAP 317* 188* 253* 242* 236*    No results found for this or any previous visit (from the past 240 hour(s)).    Studies: No results found.  Scheduled Meds: . antiseptic oral rinse  7 mL Mouth Rinse BID  . aspirin  325 mg Oral q morning - 10a  . azithromycin  500 mg Intravenous Q24H  . cefTRIAXone (ROCEPHIN)  IV  1 g Intravenous Q24H  . clopidogrel  75 mg Oral Daily  . enoxaparin (LOVENOX) injection  40 mg Subcutaneous Q24H  . furosemide  40 mg Intravenous BID  . guaiFENesin  1,200 mg Oral BID  . insulin aspart  0-20 Units Subcutaneous TID WC  . insulin glargine  5 Units Subcutaneous Daily  . ipratropium-albuterol  3 mL Nebulization Q4H  . lisinopril  2.5 mg Oral Daily  . methylPREDNISolone (SOLU-MEDROL) injection  80 mg Intravenous 3 times per day  . metoprolol  50 mg Oral BID  . nitroGLYCERIN  0.5 inch Topical 4 times per day  . sodium chloride  3 mL Intravenous Q12H  . sodium chloride  3 mL Intravenous Q12H  . sodium chloride  3 mL Intravenous Q12H   Continuous Infusions:   Principal Problem:   COPD exacerbation Active Problems:   Essential hypertension   Coronary atherosclerosis   Acute on chronic respiratory failure with hypoxia   Bilateral lower extremity edema   Diabetes   Acute on chronic diastolic CHF (congestive heart failure)   Weakness of both legs   History of CHF (congestive heart failure)   Other chest pain    Time spent: 35 minutes    Huda Petrey M.D. [facility] Pager [phone]. If 7PM-7AM, please contact night-coverage at [email or web address], password [email or web address] 05/07/2015, 8:43 PM  LOS: 2 days

## 2015-05-08 LAB — CBC
HEMATOCRIT: 28 % — AB (ref 39.0–52.0)
HEMOGLOBIN: 8.7 g/dL — AB (ref 13.0–17.0)
MCH: 28.4 pg (ref 26.0–34.0)
MCHC: 31.1 g/dL (ref 30.0–36.0)
MCV: 91.5 fL (ref 78.0–100.0)
PLATELETS: 175 10*3/uL (ref 150–400)
RBC: 3.06 MIL/uL — AB (ref 4.22–5.81)
RDW: 17 % — ABNORMAL HIGH (ref 11.5–15.5)
WBC: 10.2 10*3/uL (ref 4.0–10.5)

## 2015-05-08 LAB — GLUCOSE, CAPILLARY
GLUCOSE-CAPILLARY: 297 mg/dL — AB (ref 65–99)
Glucose-Capillary: 274 mg/dL — ABNORMAL HIGH (ref 65–99)
Glucose-Capillary: 249 mg/dL — ABNORMAL HIGH (ref 65–99)
Glucose-Capillary: 194 mg/dL — ABNORMAL HIGH (ref 65–99)

## 2015-05-08 LAB — BASIC METABOLIC PANEL
Anion gap: 12 (ref 5–15)
BUN: 16 mg/dL (ref 6–20)
CALCIUM: 9.1 mg/dL (ref 8.9–10.3)
CO2: 36 mmol/L — ABNORMAL HIGH (ref 22–32)
Chloride: 90 mmol/L — ABNORMAL LOW (ref 101–111)
Creatinine, Ser: 0.5 mg/dL — ABNORMAL LOW (ref 0.61–1.24)
GFR calc Af Amer: >60 mL/min (ref >60)
Glucose, Bld: 258 mg/dL — ABNORMAL HIGH (ref 65–99)
Potassium: 3.5 mmol/L (ref 3.5–5.1)
Sodium: 138 mmol/L (ref 135–145)

## 2015-05-08 LAB — HEMOGLOBIN A1C
HEMOGLOBIN A1C: 6.8 % — AB (ref 4.8–5.6)
Mean Plasma Glucose: 148 mg/dL

## 2015-05-08 MED ORDER — INSULIN GLARGINE 100 UNIT/ML ~~LOC~~ SOLN
10.0000 [IU] | Freq: Every day | SUBCUTANEOUS | Status: DC
  Administered 2015-05-08: 10 [IU] via SUBCUTANEOUS
  Filled 2015-05-08 (×2): qty 0.1

## 2015-05-08 MED ORDER — POTASSIUM CHLORIDE CRYS ER 20 MEQ PO TBCR
20.0000 meq | EXTENDED_RELEASE_TABLET | Freq: Every day | ORAL | Status: DC
  Administered 2015-05-08 – 2015-05-10 (×3): 20 meq via ORAL
  Filled 2015-05-08 (×4): qty 1

## 2015-05-08 MED ORDER — POTASSIUM CHLORIDE CRYS ER 20 MEQ PO TBCR
40.0000 meq | EXTENDED_RELEASE_TABLET | Freq: Once | ORAL | Status: AC
  Administered 2015-05-08: 40 meq via ORAL
  Filled 2015-05-08: qty 2

## 2015-05-08 MED ORDER — FLUTICASONE PROPIONATE 50 MCG/ACT NA SUSP
2.0000 | Freq: Two times a day (BID) | NASAL | Status: DC
  Administered 2015-05-08 – 2015-05-11 (×8): 2 via NASAL
  Filled 2015-05-08: qty 16

## 2015-05-08 MED ORDER — AZITHROMYCIN 500 MG PO TABS
500.0000 mg | ORAL_TABLET | Freq: Every day | ORAL | Status: DC
  Filled 2015-05-08: qty 1

## 2015-05-08 MED ORDER — PANTOPRAZOLE SODIUM 40 MG PO TBEC
40.0000 mg | DELAYED_RELEASE_TABLET | Freq: Every day | ORAL | Status: DC
  Administered 2015-05-08 – 2015-05-11 (×4): 40 mg via ORAL
  Filled 2015-05-08 (×2): qty 1

## 2015-05-08 MED ORDER — METHYLPREDNISOLONE SODIUM SUCC 125 MG IJ SOLR
80.0000 mg | Freq: Two times a day (BID) | INTRAMUSCULAR | Status: DC
  Administered 2015-05-09 (×2): 80 mg via INTRAVENOUS
  Filled 2015-05-08: qty 1.28
  Filled 2015-05-08: qty 2
  Filled 2015-05-08: qty 1.28

## 2015-05-08 MED ORDER — TRAMADOL HCL 50 MG PO TABS
100.0000 mg | ORAL_TABLET | Freq: Four times a day (QID) | ORAL | Status: DC | PRN
  Administered 2015-05-08 – 2015-05-12 (×11): 100 mg via ORAL
  Filled 2015-05-08 (×11): qty 2

## 2015-05-08 MED ORDER — OXYCODONE-ACETAMINOPHEN 5-325 MG PO TABS
1.0000 | ORAL_TABLET | Freq: Four times a day (QID) | ORAL | Status: DC | PRN
Start: 2015-05-08 — End: 2015-05-11
  Administered 2015-05-08 – 2015-05-11 (×11): 2 via ORAL
  Filled 2015-05-08 (×11): qty 2

## 2015-05-08 NOTE — Progress Notes (Signed)
Physical Therapy Treatment Patient Details Name: [name] MRN: [id number] DOB: 04/24/1951 Today's Date: 05/08/2015    History of Present Illness Chris BandaUlysses G [name] Jr. is a 64 y.o. male with history of CAD, O2 dependent COPD, Diastolic CHF with EF 55% on last 2D ECHO on 10/2014, HTN, DM2 who resents to the ED with complaints of worsening SOB chest tightness and wheezing  x 1 day.    PT Comments    Patient making slow progress with mobility.  Follow Up Recommendations  Home health PT     Equipment Recommendations  Wheelchair (measurements PT)    Recommendations for Other Services       Precautions / Restrictions Precautions Precautions: Fall Restrictions Weight Bearing Restrictions: No    Mobility  Bed Mobility Overal bed mobility: Needs Assistance Bed Mobility: Supine to Sit     Supine to sit: Min guard;HOB elevated     General bed mobility comments: Patient able to raise trunk to sitting with HOB elevated and use of rail.  O2 sats drop with activity into 80's, and then return into 90's with deep breathing.  Transfers Overall transfer level: Needs assistance Equipment used: Rolling walker (2 wheeled) Transfers: Sit to/from Stand Sit to Stand: Min assist         General transfer comment: Verbal cues for hand placement.  Assist to steady as patient moves to standing.  Assist for balance initially.  Ambulation/Gait Ambulation/Gait assistance: Min assist Ambulation Distance (Feet): 4 Feet Assistive device: Rolling walker (2 wheeled) Gait Pattern/deviations: Step-through pattern;Decreased stride length;Shuffle;Trunk flexed Gait velocity: Decreased Gait velocity interpretation: Below normal speed for age/gender General Gait Details: Verbal cues for safe use of RW.  Patient began ambulation well, with assist for safety/balance.  At 4', patient reports his legs feel weak and he needs to sit down.  Moved to sitting in chair.  Patient declined further ambulation.   Again, O2 dropped to 84% with mobility, and returned to 95% within 30 seconds after sitting.   Stairs            Wheelchair Mobility    Modified Rankin (Stroke Patients Only)       Balance                                    Cognition Arousal/Alertness: Awake/alert Behavior During Therapy: Anxious Overall Cognitive Status: No family/caregiver present to determine baseline cognitive functioning       Memory: Decreased short-term memory              Exercises      General Comments        Pertinent Vitals/Pain Pain Assessment: 0-10 Pain Score: 6  Pain Location: Back and headache Pain Descriptors / Indicators: Aching;Sore;Headache Pain Intervention(s): Monitored during session;Patient requesting pain meds-RN notified;Repositioned    Home Living                      Prior Function            PT Goals (current goals can now be found in the care plan section) Progress towards PT goals: Progressing toward goals    Frequency  Min 3X/week    PT Plan Current plan remains appropriate    Co-evaluation             End of Session Equipment Utilized During Treatment: Gait belt;Oxygen Activity Tolerance: Patient limited by fatigue (Limited by anxiety;  Limited by decrease in O2 sats) Patient left: in chair;with call bell/phone within reach     Time: [id number] PT Time Calculation (min) (ACUTE ONLY): 14 min  Charges:  $Therapeutic Activity: 8-22 mins                    G Codes:      [name], [name] Durenda HurtSusan H. Renaldo [name], PT, [facility] Facility[facility] Acute Rehab Services Pager [phone]

## 2015-05-08 NOTE — Progress Notes (Signed)
[facility] PROGRESS NOTE  Chris ProphetUlysses G [name] ZOX:[id number] DOB: 12/26/1950 DOA: 05/04/2015 PCP: Jeanann LewandowskyJEGEDE, OLUGBEMIGA, MD  Assessment/Plan: #1 acute on chronic respiratory failure with hypoxia Likely multifactorial secondary to acute COPD exacerbation and acute on chronic diastolic heart failure. Patient required 4 L on admission currently at 3 L. Some clinical improvement since starting IV Lasix and IV steroids. Continue empiric antibiotics, IV diuretics, IV steroid taper, bronchodilators, nebulizer treatments, Mucinex. Follow.  #2 acute COPD exacerbation Some clinical improvement since starting IV steroids. Patient still with wheezing and some cough as well as sputum production. Change empiric IV Rocephin IV azithromycin to oral levaquin. Taper IV steroids. Continue bronchodilators, mucolytics, antitussives.  #3 acute on chronic diastolic CHF Some clinical improvement. 2-D echo with EF of 60-65% with no wall motion abnormalities. Grade 1 diastolic dysfunction. I/O equal -2.96 L/24 hrs. Continue empiric IV Lasix, aspirin, metoprolol, lisinopril. Follow.  #4 chest pain Likely secondary to cough or respiratory distress. Patient history of coronary artery disease. Cardiac enzymes negative 3. 2-D echo with EF of 60-65% with no wall motion abnormalities. Patient currently asymptomatic. Continue current regimen.  #5 diabetes mellitus type 2 Last hemoglobin A1c was 7.4. Oral hypoglycemic agents on hold. Increase Lantus 10 units daily as patient is on steroids. Continue resistant sliding scale insulin. Follow.  #6 weakness PT following.  #7 prophylaxis Lovenox for DVT prophylaxis.  Code Status: Full Family Communication: Updated patient no family at bedside. Disposition Plan: Home when medically stable.   Consultants:  None  Procedures:  Chest x-ray 05/04/2015  2-D echo 05/07/2015    Antibiotics:  IV azithromycin 05/06/2015>>>>05/08/15  IV Rocephin  05/06/2015>>>>>05/08/15  Oral levaquin 05/08/15  HPI/Subjective: Patient states his breathing has improved. since yesterday. Patient denies any chest pain.  Objective: Filed Vitals:   05/08/15 1352  BP: 110/66  Pulse: 82  Temp: 98.3 F (36.8 C)  Resp: 16    Intake/Output Summary (Last 24 hours) at 05/08/15 1540 Last data filed at 05/08/15 1353  Gross per 24 hour  Intake   1300 ml  Output   1350 ml  Net    -50 ml   Filed Weights   05/06/15 0649 05/07/15 0532 05/08/15 0631  Weight: 84.913 kg (187 lb 3.2 oz) 84.641 kg (186 lb 9.6 oz) 85.004 kg (187 lb 6.4 oz)    Exam:   General:  NAD  Cardiovascular: RRR  Respiratory: Min-mild expiratory wheezes. Poor to fair air movement.  Abdomen: Soft, nontender, nondistended, positive bowel sounds.  Musculoskeletal: No clubbing cyanosis. 1+ bilateral lower extremity edema.  Data Reviewed: Basic Metabolic Panel:  Recent Labs Lab 05/04/15 2318 05/06/15 0235 05/08/15 0530  NA 137 141 138  K 4.3 5.1 3.5  CL 96* 95* 90*  CO2 32 39* 36*  GLUCOSE 206* 133* 258*  BUN 15 16 16   CREATININE 0.57* 0.67 0.50*  CALCIUM 8.4* 9.1 9.1   Liver Function Tests: No results for input(s): AST, ALT, ALKPHOS, BILITOT, PROT, ALBUMIN in the last 168 hours. No results for input(s): LIPASE, AMYLASE in the last 168 hours. No results for input(s): AMMONIA in the last 168 hours. CBC:  Recent Labs Lab 05/04/15 2318 05/06/15 0235 05/08/15 0530  WBC 9.2 8.3 10.2  HGB 8.7* 8.2* 8.7*  HCT 29.2* 26.9* 28.0*  MCV 93.6 92.8 91.5  PLT 205 203 175   Cardiac Enzymes:  Recent Labs Lab 05/05/15 0558 05/05/15 1151 05/05/15 1820  TROPONINI <0.03 <0.03 0.04*   BNP (last 3 results)  Recent Labs  05/04/15 2318  BNP 136.0*    ProBNP (last 3 results)  Recent Labs  10/27/14 2311  PROBNP 89.0    CBG:  Recent Labs Lab 05/07/15 1122 05/07/15 1614 05/07/15 2056 05/08/15 0555 05/08/15 1200  GLUCAP 242* 236* 191* 274* 249*    No  results found for this or any previous visit (from the past 240 hour(s)).   Studies: No results found.  Scheduled Meds: . antiseptic oral rinse  7 mL Mouth Rinse BID  . aspirin  325 mg Oral q morning - 10a  . [START ON 05/09/2015] azithromycin  500 mg Oral Daily  . cefTRIAXone (ROCEPHIN)  IV  1 g Intravenous Q24H  . clopidogrel  75 mg Oral Daily  . enoxaparin (LOVENOX) injection  40 mg Subcutaneous Q24H  . fluticasone  2 spray Each Nare BID  . furosemide  40 mg Intravenous BID  . guaiFENesin  1,200 mg Oral BID  . insulin aspart  0-20 Units Subcutaneous TID WC  . insulin glargine  10 Units Subcutaneous Daily  . ipratropium-albuterol  3 mL Nebulization Q4H  . lisinopril  2.5 mg Oral Daily  . methylPREDNISolone (SOLU-MEDROL) injection  80 mg Intravenous 3 times per day  . metoprolol  50 mg Oral BID  . nitroGLYCERIN  0.5 inch Topical 4 times per day  . pantoprazole  40 mg Oral Q1200  . potassium chloride SA  20 mEq Oral Daily  . sodium chloride  3 mL Intravenous Q12H   Continuous Infusions:   Principal Problem:   COPD exacerbation Active Problems:   Essential hypertension   Coronary atherosclerosis   Acute on chronic respiratory failure with hypoxia   Bilateral lower extremity edema   Diabetes   Acute on chronic diastolic CHF (congestive heart failure)   Weakness of both legs   History of CHF (congestive heart failure)   Other chest pain    Time spent: 35 minutes    [name] M.D. [facility] Pager [phone]. If 7PM-7AM, please contact night-coverage at [email or web address], password [email or web address] 05/08/2015, 3:40 PM  LOS: 3 days

## 2015-05-09 DIAGNOSIS — IMO0002 Reserved for concepts with insufficient information to code with codable children: Secondary | ICD-10-CM | POA: Insufficient documentation

## 2015-05-09 DIAGNOSIS — E1165 Type 2 diabetes mellitus with hyperglycemia: Secondary | ICD-10-CM

## 2015-05-09 DIAGNOSIS — E1151 Type 2 diabetes mellitus with diabetic peripheral angiopathy without gangrene: Secondary | ICD-10-CM | POA: Insufficient documentation

## 2015-05-09 LAB — VITAMIN B12: VITAMIN B 12: 166 pg/mL — AB (ref 180–914)

## 2015-05-09 LAB — BASIC METABOLIC PANEL
Anion gap: 9 (ref 5–15)
BUN: 19 mg/dL (ref 6–20)
CO2: 38 mmol/L — ABNORMAL HIGH (ref 22–32)
Calcium: 8.8 mg/dL — ABNORMAL LOW (ref 8.9–10.3)
Chloride: 90 mmol/L — ABNORMAL LOW (ref 101–111)
Creatinine, Ser: 0.46 mg/dL — ABNORMAL LOW (ref 0.61–1.24)
GFR calc Af Amer: >60 mL/min (ref >60)
Glucose, Bld: 292 mg/dL — ABNORMAL HIGH (ref 65–99)
POTASSIUM: 3.9 mmol/L (ref 3.5–5.1)
SODIUM: 137 mmol/L (ref 135–145)

## 2015-05-09 LAB — FOLATE: FOLATE: 5 ng/mL — AB (ref >5.9)

## 2015-05-09 LAB — CBC
HCT: 24.6 % — ABNORMAL LOW (ref 39.0–52.0)
Hemoglobin: 7.4 g/dL — ABNORMAL LOW (ref 13.0–17.0)
MCH: 27.6 pg (ref 26.0–34.0)
MCHC: 30.1 g/dL (ref 30.0–36.0)
MCV: 91.8 fL (ref 78.0–100.0)
Platelets: 192 10*3/uL (ref 150–400)
RBC: 2.68 MIL/uL — ABNORMAL LOW (ref 4.22–5.81)
RDW: 17 % — AB (ref 11.5–15.5)
WBC: 8.4 10*3/uL (ref 4.0–10.5)

## 2015-05-09 LAB — GLUCOSE, CAPILLARY
GLUCOSE-CAPILLARY: 264 mg/dL — AB (ref 65–99)
GLUCOSE-CAPILLARY: 322 mg/dL — AB (ref 65–99)
GLUCOSE-CAPILLARY: 211 mg/dL — AB (ref 65–99)
Glucose-Capillary: 208 mg/dL — ABNORMAL HIGH (ref 65–99)

## 2015-05-09 LAB — FERRITIN: Ferritin: 901 ng/mL — ABNORMAL HIGH (ref 24–336)

## 2015-05-09 LAB — IRON AND TIBC
Iron: 36 ug/dL — ABNORMAL LOW (ref 45–182)
Saturation Ratios: 16 % — ABNORMAL LOW (ref 17.9–39.5)
TIBC: 224 ug/dL — AB (ref 250–450)
UIBC: 188 ug/dL

## 2015-05-09 MED ORDER — INSULIN GLARGINE 100 UNIT/ML ~~LOC~~ SOLN
25.0000 [IU] | Freq: Every day | SUBCUTANEOUS | Status: DC
  Administered 2015-05-10 – 2015-05-11 (×2): 25 [IU] via SUBCUTANEOUS
  Filled 2015-05-09 (×2): qty 0.25

## 2015-05-09 MED ORDER — BUDESONIDE 0.25 MG/2ML IN SUSP
0.2500 mg | Freq: Two times a day (BID) | RESPIRATORY_TRACT | Status: DC
  Administered 2015-05-09 – 2015-05-11 (×5): 0.25 mg via RESPIRATORY_TRACT
  Filled 2015-05-09 (×9): qty 2

## 2015-05-09 MED ORDER — ARFORMOTEROL TARTRATE 15 MCG/2ML IN NEBU
15.0000 ug | INHALATION_SOLUTION | Freq: Two times a day (BID) | RESPIRATORY_TRACT | Status: DC
  Administered 2015-05-09 – 2015-05-12 (×5): 15 ug via RESPIRATORY_TRACT
  Filled 2015-05-09 (×8): qty 2

## 2015-05-09 MED ORDER — LEVOFLOXACIN 500 MG PO TABS
500.0000 mg | ORAL_TABLET | Freq: Every day | ORAL | Status: DC
  Administered 2015-05-09 – 2015-05-11 (×3): 500 mg via ORAL
  Filled 2015-05-09 (×4): qty 1

## 2015-05-09 MED ORDER — INSULIN GLARGINE 100 UNIT/ML ~~LOC~~ SOLN: 10.0000 [IU] | Freq: Once | SUBCUTANEOUS | Status: DC

## 2015-05-09 MED ORDER — PREDNISONE 50 MG PO TABS
60.0000 mg | ORAL_TABLET | Freq: Every day | ORAL | Status: AC
  Administered 2015-05-10 – 2015-05-12 (×3): 60 mg via ORAL
  Filled 2015-05-09 (×3): qty 1

## 2015-05-09 MED ORDER — INSULIN GLARGINE 100 UNIT/ML ~~LOC~~ SOLN
14.0000 [IU] | Freq: Every day | SUBCUTANEOUS | Status: DC
  Administered 2015-05-09: 14 [IU] via SUBCUTANEOUS
  Filled 2015-05-09 (×2): qty 0.14

## 2015-05-09 NOTE — Progress Notes (Signed)
PT Cancellation Note  Patient Details Name: Chris ProphetUlysses G [name] MRN: [id number] DOB: 11/14/1951   Cancelled Treatment:    Reason Eval/Treat Not Completed: Other (comment) Pt declined PT this AM due to wanting to have his breathing treatment first. Left note in chart for future PT to check with RN first about breathing treatment schedule to best accommodate pt's participation. Will follow up in PM if able.  [name], [name]  [name], PT, DPT Pager #: [phone] 05/09/2015, 11:37 AM

## 2015-05-09 NOTE — Progress Notes (Signed)
[facility] PROGRESS NOTE  Chris ProphetUlysses G [name] BJY:[id number] DOB: 11/16/1951 DOA: 05/04/2015 PCP: Jeanann LewandowskyJEGEDE, OLUGBEMIGA, MD  Assessment/Plan: #1 acute on chronic respiratory failure with hypoxia Likely multifactorial secondary to acute COPD exacerbation and acute on chronic diastolic heart failure. Patient required 4 L on admission currently at 3.5 L. Some clinical improvement since starting IV Lasix and IV steroids. Continue empiric antibiotics, IV diuretics, Change IV steroid taper to oral steriods, bronchodilators, nebulizer treatments, Mucinex. Follow.  #2 acute COPD exacerbation Some clinical improvement since starting IV steroids. Patient still with wheezing and some cough as well as sputum production and some upper airway noise. Continue oral levaquin. Change IV steroids to oral steroids. Continue bronchodilators, mucolytics, antitussives.  #3 acute on chronic diastolic CHF Some clinical improvement. 2-D echo with EF of 60-65% with no wall motion abnormalities. Grade 1 diastolic dysfunction. I/O equal -2.98 L/24 hrs. Continue empiric IV Lasix, aspirin, metoprolol, lisinopril. Follow.  #4 chest pain Likely secondary to cough or respiratory distress. Patient history of coronary artery disease. Cardiac enzymes negative 3. 2-D echo with EF of 60-65% with no wall motion abnormalities. Patient currently asymptomatic. Continue current regimen.  #5 diabetes mellitus type 2 Last hemoglobin A1c was 7.4. Oral hypoglycemic agents on hold. Increase Lantus 20 units daily as patient is on steroids. Continue resistant sliding scale insulin. Follow.  #6 weakness PT following.  #7 prophylaxis Lovenox for DVT prophylaxis.  Code Status: Full Family Communication: Updated patient no family at bedside. Disposition Plan: Home when medically stable.   Consultants:  None  Procedures:  Chest x-ray 05/04/2015  2-D echo 05/07/2015    Antibiotics:  IV azithromycin 05/06/2015>>>>05/08/15  IV  Rocephin 05/06/2015>>>>>05/08/15  Oral levaquin 05/08/15  HPI/Subjective: Patient states his breathing has improved. Patient denies any chest pain.  Objective: Filed Vitals:   05/09/15 1022  BP: 130/76  Pulse: 88  Temp: 98 F (36.7 C)  Resp: 18    Intake/Output Summary (Last 24 hours) at 05/09/15 1416 Last data filed at 05/09/15 1415  Gross per 24 hour  Intake   1660 ml  Output   2400 ml  Net   -740 ml   Filed Weights   05/07/15 0532 05/08/15 0631 05/09/15 0516  Weight: 84.641 kg (186 lb 9.6 oz) 85.004 kg (187 lb 6.4 oz) 84.5 kg (186 lb 4.6 oz)    Exam:   General:  NAD.  Cardiovascular: RRR  Respiratory: Min-mild expiratory wheezes. Poor to fair air movement.  Abdomen: Soft, nontender, nondistended, positive bowel sounds.  Musculoskeletal: No clubbing cyanosis. 2+ bilateral lower extremity edema.  Data Reviewed: Basic Metabolic Panel:  Recent Labs Lab 05/04/15 2318 05/06/15 0235 05/08/15 0530 05/09/15 0546  NA 137 141 138 137  K 4.3 5.1 3.5 3.9  CL 96* 95* 90* 90*  CO2 32 39* 36* 38*  GLUCOSE 206* 133* 258* 292*  BUN 15 16 16 19   CREATININE 0.57* 0.67 0.50* 0.46*  CALCIUM 8.4* 9.1 9.1 8.8*   Liver Function Tests: No results for input(s): AST, ALT, ALKPHOS, BILITOT, PROT, ALBUMIN in the last 168 hours. No results for input(s): LIPASE, AMYLASE in the last 168 hours. No results for input(s): AMMONIA in the last 168 hours. CBC:  Recent Labs Lab 05/04/15 2318 05/06/15 0235 05/08/15 0530 05/09/15 0546  WBC 9.2 8.3 10.2 8.4  HGB 8.7* 8.2* 8.7* 7.4*  HCT 29.2* 26.9* 28.0* 24.6*  MCV 93.6 92.8 91.5 91.8  PLT 205 203 175 192   Cardiac Enzymes:  Recent Labs Lab 05/05/15 0558 05/05/15 1151  05/05/15 1820  TROPONINI <0.03 <0.03 0.04*   BNP (last 3 results)  Recent Labs  05/04/15 2318  BNP 136.0*    ProBNP (last 3 results)  Recent Labs  10/27/14 2311  PROBNP 89.0    CBG:  Recent Labs Lab 05/08/15 1200 05/08/15 1714  05/08/15 2049 05/09/15 0558 05/09/15 1105  GLUCAP 249* 297* 194* 264* 322*    No results found for this or any previous visit (from the past 240 hour(s)).   Studies: No results found.  Scheduled Meds: . antiseptic oral rinse  7 mL Mouth Rinse BID  . aspirin  325 mg Oral q morning - 10a  . clopidogrel  75 mg Oral Daily  . enoxaparin (LOVENOX) injection  40 mg Subcutaneous Q24H  . fluticasone  2 spray Each Nare BID  . furosemide  40 mg Intravenous BID  . guaiFENesin  1,200 mg Oral BID  . insulin aspart  0-20 Units Subcutaneous TID WC  . insulin glargine  14 Units Subcutaneous Daily  . ipratropium-albuterol  3 mL Nebulization Q4H  . levofloxacin  500 mg Oral Daily  . lisinopril  2.5 mg Oral Daily  . methylPREDNISolone (SOLU-MEDROL) injection  80 mg Intravenous Q12H  . metoprolol  50 mg Oral BID  . nitroGLYCERIN  0.5 inch Topical 4 times per day  . pantoprazole  40 mg Oral Q1200  . potassium chloride SA  20 mEq Oral Daily  . sodium chloride  3 mL Intravenous Q12H   Continuous Infusions:   Principal Problem:   COPD exacerbation Active Problems:   Essential hypertension   Coronary atherosclerosis   Acute on chronic respiratory failure with hypoxia   Bilateral lower extremity edema   Diabetes   Acute on chronic diastolic CHF (congestive heart failure)   Weakness of both legs   History of CHF (congestive heart failure)   Other chest pain    Time spent: 35 minutes    Delta Pichon M.D. [facility] Pager [phone]. If 7PM-7AM, please contact night-coverage at [email or web address], password [email or web address] 05/09/2015, 2:16 PM  LOS: 4 days

## 2015-05-09 NOTE — Progress Notes (Signed)
Physical Therapy Treatment Patient Details Name: Haig ProphetUlysses G Marceaux MRN: [id number] DOB: 12/02/1951 Today's Date: 05/09/2015    History of Present Illness Margit BandaUlysses G Moylan Jr. is a 64 y.o. male with history of CAD, O2 dependent COPD, Diastolic CHF with EF 55% on last 2D ECHO on 10/2014, HTN, DM2 who resents to the ED with complaints of worsening SOB chest tightness and wheezing  x 1 day.    PT Comments    Pt admitted with above diagnosis. Pt currently with functional limitations due to balance and endurance deficits.  Pt ambulates but is limited due to DOE4/4 and weakness.  Continue as pt tolerates.   Pt will benefit from skilled PT to increase their independence and safety with mobility to allow discharge to the venue listed below.    Follow Up Recommendations  Home health PT     Equipment Recommendations  Wheelchair (measurements PT)    Recommendations for Other Services       Precautions / Restrictions Precautions Precautions: Fall Precaution Comments: o2 dependent Restrictions Weight Bearing Restrictions: No    Mobility  Bed Mobility Overal bed mobility: Needs Assistance Bed Mobility: Supine to Sit     Supine to sit: Min guard;HOB elevated     General bed mobility comments: Patient able to raise trunk to sitting with HOB elevated and use of rail.  O2 sats drop with activity into 80's, and then return into 90's with deep breathing.  Transfers Overall transfer level: Needs assistance Equipment used: Rolling walker (2 wheeled) Transfers: Sit to/from Stand Sit to Stand: Min assist         General transfer comment: Verbal cues for hand placement.  Assist to steady as patient moves to standing.  Assist for balance initially.Stood first time and sats dropped to 81% and pt sat back down.  Encouranged pt to try steps so second time pt ambulated 4 steps to chair.   Ambulation/Gait Ambulation/Gait assistance: Min assist Ambulation Distance (Feet): 4 Feet Assistive device:  Rolling walker (2 wheeled) Gait Pattern/deviations: Step-through pattern;Decreased stride length;Shuffle;Trunk flexed Gait velocity: Decreased Gait velocity interpretation: Below normal speed for age/gender General Gait Details: Verbal cues for safe use of RW.  Patient began ambulation well, with assist for safety/balance.  At 4', patient reports he just wanted to get to chair that was set up  so he moved to sitting in chair.  Patient declined further ambulation.  Again, O2 dropped to 81% with mobility, and pt asked PT to turn up O2  therefore turned up from 4L to 5LO2 for a minute and then back to 4L and returned to 95% within 30 seconds.   Stairs            Wheelchair Mobility    Modified Rankin (Stroke Patients Only)       Balance Overall balance assessment: Needs assistance;History of Falls Sitting-balance support: No upper extremity supported;Feet supported Sitting balance-Leahy Scale: Good     Standing balance support: Bilateral upper extremity supported;During functional activity Standing balance-Leahy Scale: Poor Standing balance comment: Pt needs UE support for balance with rW.                     Cognition Arousal/Alertness: Awake/alert Behavior During Therapy: Anxious Overall Cognitive Status: No family/caregiver present to determine baseline cognitive functioning       Memory: Decreased short-term memory              Exercises      General Comments  Pertinent Vitals/Pain Pain Assessment: Faces Faces Pain Scale: Hurts even more Pain Location: back Pain Descriptors / Indicators: Aching;Sore Pain Intervention(s): Monitored during session;Limited activity within patient's tolerance;Repositioned;Patient requesting pain meds-RN notified  Desats to 81% with any transitional movement.  Takes rest breaks of 2-3 min to recover each time. 94% on departure.     Home Living                      Prior Function            PT Goals  (current goals can now be found in the care plan section) Progress towards PT goals: Progressing toward goals    Frequency  Min 3X/week    PT Plan Current plan remains appropriate    Co-evaluation             End of Session Equipment Utilized During Treatment: Gait belt;Oxygen Activity Tolerance: Patient limited by fatigue Patient left: in chair;with call bell/phone within reach     Time: [id number] PT Time Calculation (min) (ACUTE ONLY): 16 min  Charges:  $Gait Training: 8-22 mins                    G CodesBerline Lopes:      Amarius Toto F 05/09/2015, 4:46 PM [employer] CorporationDawn Amazin Pincock,PT Acute Rehabilitation [phone] (pager)

## 2015-05-09 NOTE — Progress Notes (Signed)
Instructed pt of 1200 ml fluid restriction. Pt verbalized understanding but wishes to still have an additional cup at this time. Will continue to educate pt.

## 2015-05-09 NOTE — Progress Notes (Signed)
Talked to patient about DCP; patient lives with his sister, has home [phone] and a walker; [facility]; Attending MD at discharge please order Disease Management program for COPD at discharge; Mary with [facility] called for arrangements; Alexis GoodellB Kyandre Okray RN,BSN,MHA [phone]

## 2015-05-10 LAB — GLUCOSE, CAPILLARY
GLUCOSE-CAPILLARY: 170 mg/dL — AB (ref 65–99)
GLUCOSE-CAPILLARY: 236 mg/dL — AB (ref 65–99)
GLUCOSE-CAPILLARY: 64 mg/dL — AB (ref 65–99)
Glucose-Capillary: 145 mg/dL — ABNORMAL HIGH (ref 65–99)
Glucose-Capillary: 58 mg/dL — ABNORMAL LOW (ref 65–99)
Glucose-Capillary: 147 mg/dL — ABNORMAL HIGH (ref 65–99)
Glucose-Capillary: 56 mg/dL — ABNORMAL LOW (ref 65–99)

## 2015-05-10 LAB — COMPREHENSIVE METABOLIC PANEL
ALBUMIN: 2.2 g/dL — AB (ref 3.5–5.0)
ALT: 79 U/L — ABNORMAL HIGH (ref 17–63)
AST: 26 U/L (ref 15–41)
Alkaline Phosphatase: 143 U/L — ABNORMAL HIGH (ref 38–126)
Anion gap: 12 (ref 5–15)
BILIRUBIN TOTAL: 0.3 mg/dL (ref 0.3–1.2)
BUN: 20 mg/dL (ref 6–20)
CHLORIDE: 90 mmol/L — AB (ref 101–111)
CO2: 37 mmol/L — ABNORMAL HIGH (ref 22–32)
CREATININE: 0.53 mg/dL — AB (ref 0.61–1.24)
Calcium: 8.8 mg/dL — ABNORMAL LOW (ref 8.9–10.3)
GFR calc Af Amer: >60 mL/min (ref >60)
GLUCOSE: 220 mg/dL — AB (ref 65–99)
Potassium: 4.4 mmol/L (ref 3.5–5.1)
Sodium: 139 mmol/L (ref 135–145)
Total Protein: 5.9 g/dL — ABNORMAL LOW (ref 6.5–8.1)

## 2015-05-10 LAB — CBC
HCT: 28.1 % — ABNORMAL LOW (ref 39.0–52.0)
HEMOGLOBIN: 8.5 g/dL — AB (ref 13.0–17.0)
MCH: 28.1 pg (ref 26.0–34.0)
MCHC: 30.2 g/dL (ref 30.0–36.0)
MCV: 93 fL (ref 78.0–100.0)
Platelets: 218 10*3/uL (ref 150–400)
RBC: 3.02 MIL/uL — ABNORMAL LOW (ref 4.22–5.81)
RDW: 16.9 % — AB (ref 11.5–15.5)
WBC: 10.1 10*3/uL (ref 4.0–10.5)

## 2015-05-10 MED ORDER — DEXTROSE 50 % IV SOLN
INTRAVENOUS | Status: AC
  Administered 2015-05-10: 23:00:00
  Filled 2015-05-10: qty 50

## 2015-05-10 NOTE — Progress Notes (Signed)
[facility] PROGRESS NOTE  [name] BJY:[id number] DOB: January 12, 1951 DOA: 05/04/2015 PCP: Jeanann Lewandowsky, MD  Assessment/Plan: #1 acute on chronic respiratory failure with hypoxia Likely multifactorial secondary to acute COPD exacerbation and acute on chronic diastolic heart failure. Patient required 4 L on admission currently at 3.5 L. Some clinical improvement since starting IV Lasix and IV steroids. Continue empiric antibiotics, IV diuretics, oral steriods, bronchodilators, nebulizer treatments, Mucinex. Follow.  #2 acute COPD exacerbation Some clinical improvement since starting IV steroids. Patient still with wheezing and some cough as well as sputum production and some upper airway noise. Continue oral levaquin. Continue oral steroids. Continue bronchodilators, mucolytics, antitussives.  #3 acute on chronic diastolic CHF Some clinical improvement. 2-D echo with EF of 60-65% with no wall motion abnormalities. Grade 1 diastolic dysfunction. I/O equal -2.9 L/during this hospitalization. Continue empiric IV Lasix, aspirin, metoprolol, lisinopril. Follow.  #4 chest pain Likely secondary to cough or respiratory distress. Patient history of coronary artery disease. Cardiac enzymes negative 3. 2-D echo with EF of 60-65% with no wall motion abnormalities. Patient currently asymptomatic. Continue current regimen.  #5 diabetes mellitus type 2 Last hemoglobin A1c was 7.4. Oral hypoglycemic agents on hold. Continue Lantus 20 units daily as patient is on steroids. Continue resistant sliding scale insulin. Follow.  #6 weakness PT following.  #7 prophylaxis Lovenox for DVT prophylaxis.  Code Status: Full Family Communication: Updated patient no family at bedside. Disposition Plan: Home when medically stable, in 1-2 days.   Consultants:  None  Procedures:  Chest x-ray 05/04/2015  2-D echo 05/07/2015    Antibiotics:  IV azithromycin 05/06/2015>>>>05/08/15  IV Rocephin  05/06/2015>>>>>05/08/15  Oral levaquin 05/08/15  HPI/Subjective: Patient states his breathing has improved. Patient denies any chest pain.  Objective: Filed Vitals:   05/10/15 0449  BP: 148/71  Pulse: 85  Temp: 97.9 F (36.6 C)  Resp: 20    Intake/Output Summary (Last 24 hours) at 05/10/15 1143 Last data filed at 05/10/15 0955  Gross per 24 hour  Intake   1440 ml  Output   1000 ml  Net    440 ml   Filed Weights   05/08/15 0631 05/09/15 0516 05/10/15 0449  Weight: 85.004 kg (187 lb 6.4 oz) 84.5 kg (186 lb 4.6 oz) 85.412 kg (188 lb 4.8 oz)    Exam:   General:  NAD.  Cardiovascular: RRR  Respiratory: Min-mild expiratory wheezes. Fair air movement.  Abdomen: Soft, nontender, nondistended, positive bowel sounds.  Musculoskeletal: No clubbing cyanosis. 1-2+ bilateral lower extremity edema.  Data Reviewed: Basic Metabolic Panel:  Recent Labs Lab 05/04/15 2318 05/06/15 0235 05/08/15 0530 05/09/15 0546 05/10/15 0349  NA 137 141 138 137 139  K 4.3 5.1 3.5 3.9 4.4  CL 96* 95* 90* 90* 90*  CO2 32 39* 36* 38* 37*  GLUCOSE 206* 133* 258* 292* 220*  BUN CREATININE 0.57* 0.67 0.50* 0.46* 0.53*  CALCIUM 8.4* 9.1 9.1 8.8* 8.8*   Liver Function Tests:  Recent Labs Lab 05/10/15 0349  AST 26  ALT 79*  ALKPHOS 143*  BILITOT 0.3  PROT 5.9*  ALBUMIN 2.2*   No results for input(s): LIPASE, AMYLASE in the last 168 hours. No results for input(s): AMMONIA in the last 168 hours. CBC:  Recent Labs Lab 05/04/15 2318 05/06/15 0235 05/08/15 0530 05/09/15 0546 05/10/15 0349  WBC 9.2 8.3 10.2 8.4 10.1  HGB 8.7* 8.2* 8.7* 7.4* 8.5*  HCT 29.2* 26.9* 28.0* 24.6* 28.1*  MCV 93.6  92.8 91.5 91.8 93.0  PLT 205 203 175 192 218   Cardiac Enzymes:  Recent Labs Lab 05/05/15 0558 05/05/15 1151 05/05/15 1820  TROPONINI <0.03 <0.03 0.04*   BNP (last 3 results)  Recent Labs  05/04/15 2318  BNP 136.0*    ProBNP (last 3 results)  Recent Labs   10/27/14 2311  PROBNP 89.0    CBG:  Recent Labs Lab 05/09/15 0558 05/09/15 1105 05/09/15 1610 05/09/15 2107 05/10/15 0601  GLUCAP 264* 322* 211* 208* 170*    No results found for this or any previous visit (from the past 240 hour(s)).   Studies: No results found.  Scheduled Meds: . antiseptic oral rinse  7 mL Mouth Rinse BID  . arformoterol  15 mcg Nebulization BID  . aspirin  325 mg Oral q morning - 10a  . budesonide (PULMICORT) nebulizer solution  0.25 mg Nebulization BID  . clopidogrel  75 mg Oral Daily  . enoxaparin (LOVENOX) injection  40 mg Subcutaneous Q24H  . fluticasone  2 spray Each Nare BID  . furosemide  40 mg Intravenous BID  . guaiFENesin  1,200 mg Oral BID  . insulin aspart  0-20 Units Subcutaneous TID WC  . insulin glargine  25 Units Subcutaneous Daily  . ipratropium-albuterol  3 mL Nebulization Q4H  . levofloxacin  500 mg Oral Daily  . lisinopril  2.5 mg Oral Daily  . metoprolol  50 mg Oral BID  . nitroGLYCERIN  0.5 inch Topical 4 times per day  . pantoprazole  40 mg Oral Q1200  . potassium chloride SA  20 mEq Oral Daily  . predniSONE  60 mg Oral QAC breakfast  . sodium chloride  3 mL Intravenous Q12H   Continuous Infusions:   Principal Problem:   COPD exacerbation Active Problems:   Essential hypertension   Coronary atherosclerosis   Acute on chronic respiratory failure with hypoxia   Bilateral lower extremity edema   Diabetes   Acute on chronic diastolic CHF (congestive heart failure)   Weakness of both legs   History of CHF (congestive heart failure)   Other chest pain   DM (diabetes mellitus) type II uncontrolled, periph vascular disorder    Time spent: 35 minutes    Fionna Merriott M.D. [facility] Pager [phone]. If 7PM-7AM, please contact night-coverage at [email or web address], password [email or web address] 05/10/2015, 11:43 AM  LOS: 5 days

## 2015-05-10 NOTE — Progress Notes (Addendum)
Pt. OOB to the chair. Made comfortable 02 at 3.5 L pt. C/o back pain see MAR.

## 2015-05-11 LAB — GLUCOSE, CAPILLARY
Glucose-Capillary: 149 mg/dL — ABNORMAL HIGH (ref 65–99)
Glucose-Capillary: 265 mg/dL — ABNORMAL HIGH (ref 65–99)
Glucose-Capillary: 80 mg/dL (ref 65–99)
Glucose-Capillary: 94 mg/dL (ref 65–99)

## 2015-05-11 LAB — BASIC METABOLIC PANEL
Anion gap: 7 (ref 5–15)
BUN: 15 mg/dL (ref 6–20)
CALCIUM: 8.7 mg/dL — AB (ref 8.9–10.3)
CO2: 42 mmol/L — ABNORMAL HIGH (ref 22–32)
CREATININE: 0.45 mg/dL — AB (ref 0.61–1.24)
Chloride: 89 mmol/L — ABNORMAL LOW (ref 101–111)
GFR calc Af Amer: >60 mL/min (ref >60)
Glucose, Bld: 144 mg/dL — ABNORMAL HIGH (ref 65–99)
Potassium: 3.3 mmol/L — ABNORMAL LOW (ref 3.5–5.1)
SODIUM: 138 mmol/L (ref 135–145)

## 2015-05-11 LAB — CBC
HEMATOCRIT: 29.3 % — AB (ref 39.0–52.0)
Hemoglobin: 8.7 g/dL — ABNORMAL LOW (ref 13.0–17.0)
MCH: 27.4 pg (ref 26.0–34.0)
MCHC: 29.7 g/dL — ABNORMAL LOW (ref 30.0–36.0)
MCV: 92.1 fL (ref 78.0–100.0)
Platelets: 198 10*3/uL (ref 150–400)
RBC: 3.18 MIL/uL — ABNORMAL LOW (ref 4.22–5.81)
RDW: 16.9 % — ABNORMAL HIGH (ref 11.5–15.5)
WBC: 5 10*3/uL (ref 4.0–10.5)

## 2015-05-11 MED ORDER — POTASSIUM CHLORIDE CRYS ER 20 MEQ PO TBCR
40.0000 meq | EXTENDED_RELEASE_TABLET | Freq: Every day | ORAL | Status: DC
  Administered 2015-05-11 – 2015-05-19 (×9): 40 meq via ORAL
  Filled 2015-05-11 (×9): qty 2

## 2015-05-11 MED ORDER — ACETYLCYSTEINE 20 % IN SOLN
4.0000 mL | RESPIRATORY_TRACT | Status: DC
  Administered 2015-05-11 – 2015-05-12 (×3): 4 mL via RESPIRATORY_TRACT
  Filled 2015-05-11 (×14): qty 4

## 2015-05-11 MED ORDER — INSULIN GLARGINE 100 UNIT/ML ~~LOC~~ SOLN
10.0000 [IU] | Freq: Every day | SUBCUTANEOUS | Status: DC
  Filled 2015-05-11: qty 0.1

## 2015-05-11 MED ORDER — OXYCODONE-ACETAMINOPHEN 5-325 MG PO TABS
1.0000 | ORAL_TABLET | ORAL | Status: DC | PRN
  Filled 2015-05-11: qty 2

## 2015-05-11 MED ORDER — OXYCODONE HCL 5 MG PO TABS
5.0000 mg | ORAL_TABLET | ORAL | Status: DC | PRN
  Administered 2015-05-11 – 2015-05-12 (×4): 10 mg via ORAL
  Filled 2015-05-11 (×5): qty 2

## 2015-05-11 NOTE — Progress Notes (Signed)
[facility] PROGRESS NOTE  TRAY KLAYMAN ZOX:[id number] DOB: 02/04/1951 DOA: 05/04/2015 PCP: Jeanann Lewandowsky, MD  Assessment/Plan: #1 acute on chronic respiratory failure with hypoxia Likely multifactorial secondary to acute COPD exacerbation and acute on chronic diastolic heart failure. Patient required 4 L on admission currently at 3.5 L. Some clinical improvement since starting IV Lasix and IV steroids. Continue empiric antibiotics, IV diuretics, oral steriods taper, bronchodilators, nebulizer treatments, Mucinex. Follow.  #2 acute COPD exacerbation Some clinical improvement since starting IV steroids. Patient still with wheezing and some cough as well as sputum production and some upper airway noise. Continue oral levaquin. Continue oral steroids taper. Continue bronchodilators, mucolytics, antitussives.  #3 acute on chronic diastolic CHF Some clinical improvement. 2-D echo with EF of 60-65% with no wall motion abnormalities. Grade 1 diastolic dysfunction. I/O equal -4.1 L/during this hospitalization. Continue empiric IV Lasix, aspirin, metoprolol, lisinopril. Follow.  #4 chest pain Likely secondary to cough or respiratory distress. Patient history of coronary artery disease. Cardiac enzymes negative 3. 2-D echo with EF of 60-65% with no wall motion abnormalities. Patient currently asymptomatic. Continue current regimen.  #5 diabetes mellitus type 2 Last hemoglobin A1c was 7.4. Oral hypoglycemic agents on hold. CBG 80-149. Decrease Lantus 10 units daily as patient is on steroids taper. Continue resistant sliding scale insulin. Follow.  #6 weakness PT following.  #7 prophylaxis Lovenox for DVT prophylaxis.  Code Status: Full Family Communication: Updated patient no family at bedside. Disposition Plan: Home when medically stable, in 1-2 days.   Consultants:  None  Procedures:  Chest x-ray 05/04/2015  2-D echo 05/07/2015    Antibiotics:  IV azithromycin  05/06/2015>>>>05/08/15  IV Rocephin 05/06/2015>>>>>05/08/15  Oral levaquin 05/08/15  HPI/Subjective: Patient states his breathing has improved. Patient denies any chest pain. Patient c/o chronic back pain.  Objective: Filed Vitals:   05/11/15 1009  BP: 111/65  Pulse: 120  Temp: 98.4 F (36.9 C)  Resp:     Intake/Output Summary (Last 24 hours) at 05/11/15 1111 Last data filed at 05/11/15 0720  Gross per 24 hour  Intake    960 ml  Output   2175 ml  Net  -1215 ml   Filed Weights   05/09/15 0516 05/10/15 0449 05/11/15 0412  Weight: 84.5 kg (186 lb 4.6 oz) 85.412 kg (188 lb 4.8 oz) 84.8 kg (186 lb 15.2 oz)    Exam:   General:  NAD.  Cardiovascular: RRR  Respiratory: Min-mild expiratory wheezes. Fair air movement.  Abdomen: Soft, nontender, nondistended, positive bowel sounds.  Musculoskeletal: No clubbing cyanosis. 1-2+ bilateral lower extremity edema.  Data Reviewed: Basic Metabolic Panel:  Recent Labs Lab 05/06/15 0235 05/08/15 0530 05/09/15 0546 05/10/15 0349 05/11/15 0600  NA 141 138 137 139 138  K 5.1 3.5 3.9 4.4 3.3*  CL 95* 90* 90* 90* 89*  CO2 39* 36* 38* 37* 42*  GLUCOSE 133* 258* 292* 220* 144*  BUN CREATININE 0.67 0.50* 0.46* 0.53* 0.45*  CALCIUM 9.1 9.1 8.8* 8.8* 8.7*   Liver Function Tests:  Recent Labs Lab 05/10/15 0349  AST 26  ALT 79*  ALKPHOS 143*  BILITOT 0.3  PROT 5.9*  ALBUMIN 2.2*   No results for input(s): LIPASE, AMYLASE in the last 168 hours. No results for input(s): AMMONIA in the last 168 hours. CBC:  Recent Labs Lab 05/06/15 0235 05/08/15 0530 05/09/15 0546 05/10/15 0349 05/11/15 0600  WBC 8.3 10.2 8.4 10.1 5.0  HGB 8.2* 8.7* 7.4* 8.5* 8.7*  HCT 26.9* 28.0* 24.6* 28.1* 29.3*  MCV 92.8 91.5 91.8 93.0 92.1  PLT 203 175 192 218 198   Cardiac Enzymes:  Recent Labs Lab 05/05/15 0558 05/05/15 1151 05/05/15 1820  TROPONINI <0.03 <0.03 0.04*   BNP (last 3 results)  Recent Labs   05/04/15 2318  BNP 136.0*    ProBNP (last 3 results)  Recent Labs  10/27/14 2311  PROBNP 89.0    CBG:  Recent Labs Lab 05/10/15 2146 05/10/15 2213 05/10/15 2238 05/10/15 2317 05/11/15 0536  GLUCAP 58* 64* 56* 147* 149*    No results found for this or any previous visit (from the past 240 hour(s)).   Studies: No results found.  Scheduled Meds: . acetylcysteine  4 mL Nebulization Q4H  . antiseptic oral rinse  7 mL Mouth Rinse BID  . arformoterol  15 mcg Nebulization BID  . aspirin  325 mg Oral q morning - 10a  . budesonide (PULMICORT) nebulizer solution  0.25 mg Nebulization BID  . clopidogrel  75 mg Oral Daily  . enoxaparin (LOVENOX) injection  40 mg Subcutaneous Q24H  . fluticasone  2 spray Each Nare BID  . furosemide  40 mg Intravenous BID  . guaiFENesin  1,200 mg Oral BID  . insulin aspart  0-20 Units Subcutaneous TID WC  . insulin glargine  25 Units Subcutaneous Daily  . ipratropium-albuterol  3 mL Nebulization Q4H  . levofloxacin  500 mg Oral Daily  . lisinopril  2.5 mg Oral Daily  . metoprolol  50 mg Oral BID  . nitroGLYCERIN  0.5 inch Topical 4 times per day  . pantoprazole  40 mg Oral Q1200  . potassium chloride SA  40 mEq Oral Daily  . predniSONE  60 mg Oral QAC breakfast  . sodium chloride  3 mL Intravenous Q12H   Continuous Infusions:   Principal Problem:   COPD exacerbation Active Problems:   Essential hypertension   Coronary atherosclerosis   Acute on chronic respiratory failure with hypoxia   Bilateral lower extremity edema   Diabetes   Acute on chronic diastolic CHF (congestive heart failure)   Weakness of both legs   History of CHF (congestive heart failure)   Other chest pain   DM (diabetes mellitus) type II uncontrolled, periph vascular disorder    Time spent: 35 minutes    THOMPSON,DANIEL M.D. [facility] Pager [phone]. If 7PM-7AM, please contact night-coverage at [email or web address], password [email or web address] 05/11/2015, 11:11 AM   LOS: 6 days

## 2015-05-11 NOTE — Care Management Note (Signed)
Case Management Note  Patient Details  Name: [name] MRN: [id number] Date of Birth: 07/07/1951  Subjective/Objective:                   SOB Action/Plan: Discharge planning  Expected Discharge Date:  05/12/15               Expected Discharge Plan:  Home w Home Health Services  In-House Referral:     Discharge planning Services  CM Consult  Post Acute Care Choice:  Home Health Choice offered to:  Patient  DME Arranged:    DME Agency:     HH Arranged:  RN, PT, OT, Nurse's Aide [facility]  Status of Service:  Completed, signed off  Medicare Important Message Given:    Date Medicare IM Given:    Medicare IM give by:    Date Additional Medicare IM Given:    Additional Medicare Important Message give by:     If discussed at Long Length of Stay Meetings, dates discussed:    Additional Comments: CM confirms [facility] (set up by previous CM ) with pt.  Orders and F2F  Placed.  No other CM needs were communicated. Yves DillJeffries, Meredith Mells Christine, RN 05/11/2015, 7:58 AM

## 2015-05-12 ENCOUNTER — Inpatient Hospital Stay (HOSPITAL_COMMUNITY): Payer: Medicare Other

## 2015-05-12 DIAGNOSIS — J441 Chronic obstructive pulmonary disease with (acute) exacerbation: Principal | ICD-10-CM

## 2015-05-12 DIAGNOSIS — J9620 Acute and chronic respiratory failure, unspecified whether with hypoxia or hypercapnia: Secondary | ICD-10-CM | POA: Clinically undetermined

## 2015-05-12 DIAGNOSIS — I5033 Acute on chronic diastolic (congestive) heart failure: Secondary | ICD-10-CM

## 2015-05-12 DIAGNOSIS — J9622 Acute and chronic respiratory failure with hypercapnia: Secondary | ICD-10-CM

## 2015-05-12 DIAGNOSIS — R600 Localized edema: Secondary | ICD-10-CM

## 2015-05-12 DIAGNOSIS — J189 Pneumonia, unspecified organism: Secondary | ICD-10-CM

## 2015-05-12 LAB — TROPONIN I
Troponin I: 0.03 ng/mL (ref <0.031)
Troponin I: <0.03 ng/mL (ref <0.031)
Troponin I: <0.03 ng/mL

## 2015-05-12 LAB — POCT I-STAT 3, ART BLOOD GAS (G3+)
Acid-Base Excess: 15 mmol/L — ABNORMAL HIGH (ref 0.0–2.0)
Bicarbonate: 42.5 mEq/L — ABNORMAL HIGH (ref 20.0–24.0)
O2 SAT: 88 %
TCO2: 45 mmol/L (ref 0–100)
pCO2 arterial: 68.5 mmHg (ref 35.0–45.0)
pH, Arterial: 7.401 (ref 7.350–7.450)
pO2, Arterial: 58 mmHg — ABNORMAL LOW (ref 80.0–100.0)

## 2015-05-12 LAB — HIV ANTIBODY (ROUTINE TESTING W REFLEX): HIV SCREEN 4TH GENERATION: NONREACTIVE

## 2015-05-12 LAB — BLOOD GAS, ARTERIAL
ACID-BASE EXCESS: 14.7 mmol/L — AB (ref 0.0–2.0)
Acid-Base Excess: 16.4 mmol/L — ABNORMAL HIGH (ref 0.0–2.0)
BICARBONATE: 41.8 meq/L — AB (ref 20.0–24.0)
Bicarbonate: 40.8 mEq/L — ABNORMAL HIGH (ref 20.0–24.0)
Drawn by: 312761
Drawn by: 257701
FIO2: 55 %
O2 CONTENT: 8 L/min
O2 Saturation: 98 %
O2 Saturation: 98.9 %
PCO2 ART: 64.2 mmHg — AB (ref 35.0–45.0)
PH ART: 7.429 (ref 7.350–7.450)
PO2 ART: 109 mmHg — AB (ref 80.0–100.0)
Patient temperature: 98.6
Patient temperature: 98.6
TCO2: 43.7 mmol/L (ref 0–100)
TCO2: 43 mmol/L (ref 0–100)
pCO2 arterial: 74.2 mmHg (ref 35.0–45.0)
pH, Arterial: 7.359 (ref 7.350–7.450)
pO2, Arterial: 117 mmHg — ABNORMAL HIGH (ref 80.0–100.0)

## 2015-05-12 LAB — CBC
HEMATOCRIT: 31.7 % — AB (ref 39.0–52.0)
HEMOGLOBIN: 9.4 g/dL — AB (ref 13.0–17.0)
MCH: 27 pg (ref 26.0–34.0)
MCHC: 29.7 g/dL — ABNORMAL LOW (ref 30.0–36.0)
MCV: 91.1 fL (ref 78.0–100.0)
Platelets: 224 10*3/uL (ref 150–400)
RBC: 3.48 MIL/uL — ABNORMAL LOW (ref 4.22–5.81)
RDW: 17 % — ABNORMAL HIGH (ref 11.5–15.5)
WBC: 6.2 10*3/uL (ref 4.0–10.5)

## 2015-05-12 LAB — BASIC METABOLIC PANEL
ANION GAP: 12 (ref 5–15)
BUN: 18 mg/dL (ref 6–20)
CO2: 38 mmol/L — ABNORMAL HIGH (ref 22–32)
Calcium: 8.5 mg/dL — ABNORMAL LOW (ref 8.9–10.3)
Chloride: 86 mmol/L — ABNORMAL LOW (ref 101–111)
Creatinine, Ser: 0.54 mg/dL — ABNORMAL LOW (ref 0.61–1.24)
GFR calc non Af Amer: >60 mL/min (ref >60)
GLUCOSE: 81 mg/dL (ref 65–99)
Potassium: 3.7 mmol/L (ref 3.5–5.1)
Sodium: 136 mmol/L (ref 135–145)

## 2015-05-12 LAB — LACTIC ACID, PLASMA: Lactic Acid, Venous: 0.9 mmol/L (ref 0.5–2.0)

## 2015-05-12 LAB — GLUCOSE, CAPILLARY
GLUCOSE-CAPILLARY: 158 mg/dL — AB (ref 65–99)
GLUCOSE-CAPILLARY: 162 mg/dL — AB (ref 65–99)
Glucose-Capillary: 83 mg/dL (ref 65–99)
Glucose-Capillary: 93 mg/dL (ref 65–99)
Glucose-Capillary: 172 mg/dL — ABNORMAL HIGH (ref 65–99)

## 2015-05-12 LAB — STREP PNEUMONIAE URINARY ANTIGEN: Strep Pneumo Urinary Antigen: NEGATIVE

## 2015-05-12 LAB — MRSA PCR SCREENING: MRSA by PCR: POSITIVE — AB

## 2015-05-12 MED ORDER — METHYLPREDNISOLONE SODIUM SUCC 125 MG IJ SOLR
60.0000 mg | Freq: Two times a day (BID) | INTRAMUSCULAR | Status: DC
  Administered 2015-05-12 – 2015-05-13 (×2): 60 mg via INTRAVENOUS
  Filled 2015-05-12: qty 2
  Filled 2015-05-12: qty 0.96
  Filled 2015-05-12: qty 2
  Filled 2015-05-12: qty 0.96

## 2015-05-12 MED ORDER — VANCOMYCIN HCL IN DEXTROSE 1-5 GM/200ML-% IV SOLN
1000.0000 mg | Freq: Three times a day (TID) | INTRAVENOUS | Status: DC
  Filled 2015-05-12: qty 200

## 2015-05-12 MED ORDER — SODIUM CHLORIDE 0.9 % IV SOLN: Freq: Once | INTRAVENOUS | Status: AC

## 2015-05-12 MED ORDER — SODIUM CHLORIDE 0.9 % IJ SOLN: 10.0000 mL | INTRAMUSCULAR | Status: DC | PRN

## 2015-05-12 MED ORDER — SODIUM CHLORIDE 0.9 % IJ SOLN
10.0000 mL | Freq: Two times a day (BID) | INTRAMUSCULAR | Status: DC
  Administered 2015-05-12: 10 mL

## 2015-05-12 MED ORDER — BUDESONIDE 0.5 MG/2ML IN SUSP
0.5000 mg | Freq: Two times a day (BID) | RESPIRATORY_TRACT | Status: DC
  Administered 2015-05-12 – 2015-05-19 (×14): 0.5 mg via RESPIRATORY_TRACT
  Filled 2015-05-12 (×15): qty 2

## 2015-05-12 MED ORDER — METHYLPREDNISOLONE SODIUM SUCC 125 MG IJ SOLR: 60.0000 mg | Freq: Every day | INTRAMUSCULAR | Status: DC

## 2015-05-12 MED ORDER — DEXTROSE 5 % IV SOLN
1.0000 g | Freq: Three times a day (TID) | INTRAVENOUS | Status: DC
  Administered 2015-05-12 – 2015-05-17 (×15): 1 g via INTRAVENOUS
  Filled 2015-05-12 (×19): qty 1

## 2015-05-12 MED ORDER — LEVALBUTEROL HCL 0.63 MG/3ML IN NEBU
0.6300 mg | INHALATION_SOLUTION | Freq: Once | RESPIRATORY_TRACT | Status: AC
  Administered 2015-05-12: 0.63 mg via RESPIRATORY_TRACT

## 2015-05-12 MED ORDER — INSULIN ASPART 100 UNIT/ML ~~LOC~~ SOLN
0.0000 [IU] | Freq: Three times a day (TID) | SUBCUTANEOUS | Status: DC
Start: 2015-05-12 — End: 2015-05-19
  Administered 2015-05-12: 4 [IU] via SUBCUTANEOUS
  Administered 2015-05-13: 11 [IU] via SUBCUTANEOUS
  Administered 2015-05-13: 3 [IU] via SUBCUTANEOUS
  Administered 2015-05-14: 7 [IU] via SUBCUTANEOUS
  Administered 2015-05-14: 4 [IU] via SUBCUTANEOUS
  Administered 2015-05-14 – 2015-05-15 (×2): 7 [IU] via SUBCUTANEOUS
  Administered 2015-05-15: 4 [IU] via SUBCUTANEOUS
  Administered 2015-05-16: 7 [IU] via SUBCUTANEOUS
  Administered 2015-05-16: 15 [IU] via SUBCUTANEOUS
  Administered 2015-05-17 (×2): 4 [IU] via SUBCUTANEOUS
  Administered 2015-05-18: 3 [IU] via SUBCUTANEOUS
  Administered 2015-05-18: 4 [IU] via SUBCUTANEOUS
  Administered 2015-05-19: 11 [IU] via SUBCUTANEOUS

## 2015-05-12 MED ORDER — CHLORHEXIDINE GLUCONATE 0.12 % MT SOLN
15.0000 mL | Freq: Two times a day (BID) | OROMUCOSAL | Status: DC
  Administered 2015-05-12 (×2): 15 mL via OROMUCOSAL
  Filled 2015-05-12 (×2): qty 15

## 2015-05-12 MED ORDER — SODIUM CHLORIDE 0.9 % IV SOLN: INTRAVENOUS | Status: DC

## 2015-05-12 MED ORDER — INSULIN ASPART 100 UNIT/ML ~~LOC~~ SOLN
0.0000 [IU] | SUBCUTANEOUS | Status: DC
  Administered 2015-05-12: 4 [IU] via SUBCUTANEOUS

## 2015-05-12 MED ORDER — INSULIN ASPART 100 UNIT/ML ~~LOC~~ SOLN: 0.0000 [IU] | SUBCUTANEOUS | Status: DC

## 2015-05-12 MED ORDER — PANTOPRAZOLE SODIUM 40 MG IV SOLR
40.0000 mg | Freq: Two times a day (BID) | INTRAVENOUS | Status: DC
  Administered 2015-05-12 (×2): 40 mg via INTRAVENOUS
  Filled 2015-05-12 (×4): qty 40

## 2015-05-12 MED ORDER — CETYLPYRIDINIUM CHLORIDE 0.05 % MT LIQD
7.0000 mL | Freq: Two times a day (BID) | OROMUCOSAL | Status: DC
  Administered 2015-05-12: 7 mL via OROMUCOSAL

## 2015-05-12 MED ORDER — CHLORHEXIDINE GLUCONATE CLOTH 2 % EX PADS
6.0000 | MEDICATED_PAD | Freq: Every day | CUTANEOUS | Status: AC
  Administered 2015-05-13 – 2015-05-17 (×4): 6 via TOPICAL

## 2015-05-12 MED ORDER — FENTANYL CITRATE (PF) 100 MCG/2ML IJ SOLN
25.0000 ug | INTRAMUSCULAR | Status: DC | PRN
  Administered 2015-05-12 – 2015-05-14 (×8): 50 ug via INTRAVENOUS
  Filled 2015-05-12 (×8): qty 2

## 2015-05-12 MED ORDER — INSULIN GLARGINE 100 UNIT/ML ~~LOC~~ SOLN
5.0000 [IU] | Freq: Every day | SUBCUTANEOUS | Status: DC
  Administered 2015-05-12: 5 [IU] via SUBCUTANEOUS
  Filled 2015-05-12 (×2): qty 0.05

## 2015-05-12 MED ORDER — VANCOMYCIN HCL IN DEXTROSE 1-5 GM/200ML-% IV SOLN
1000.0000 mg | Freq: Three times a day (TID) | INTRAVENOUS | Status: DC
  Administered 2015-05-12: 1000 mg via INTRAVENOUS
  Filled 2015-05-12 (×4): qty 200

## 2015-05-12 MED ORDER — MUPIROCIN 2 % EX OINT
1.0000 "application " | TOPICAL_OINTMENT | Freq: Two times a day (BID) | CUTANEOUS | Status: AC
  Administered 2015-05-12 – 2015-05-17 (×10): 1 via NASAL
  Filled 2015-05-12 (×3): qty 22

## 2015-05-12 MED ORDER — ALBUTEROL SULFATE (2.5 MG/3ML) 0.083% IN NEBU
2.5000 mg | INHALATION_SOLUTION | RESPIRATORY_TRACT | Status: DC
  Administered 2015-05-12 – 2015-05-19 (×43): 2.5 mg via RESPIRATORY_TRACT
  Filled 2015-05-12 (×44): qty 3

## 2015-05-12 MED ORDER — VANCOMYCIN HCL IN DEXTROSE 1-5 GM/200ML-% IV SOLN
1000.0000 mg | Freq: Three times a day (TID) | INTRAVENOUS | Status: DC
  Administered 2015-05-12 – 2015-05-14 (×7): 1000 mg via INTRAVENOUS
  Filled 2015-05-12 (×11): qty 200

## 2015-05-12 MED ORDER — INSULIN ASPART 100 UNIT/ML ~~LOC~~ SOLN: 0.0000 [IU] | Freq: Three times a day (TID) | SUBCUTANEOUS | Status: DC

## 2015-05-12 MED ORDER — SODIUM CHLORIDE 0.9 % IV BOLUS (SEPSIS): 750.0000 mL | Freq: Once | INTRAVENOUS | Status: DC

## 2015-05-12 MED ORDER — LORAZEPAM 0.5 MG PO TABS
0.5000 mg | ORAL_TABLET | Freq: Once | ORAL | Status: AC
  Administered 2015-05-12: 0.5 mg via ORAL
  Filled 2015-05-12: qty 1

## 2015-05-12 NOTE — Progress Notes (Signed)
CRITICAL VALUE ALERT  Critical value received:  MRSA PCR swab positive  Date of notification:  05/12/2015  Time of notification:  1555  Critical value read back:Yes.    Nurse who received alert:  Deneise LeverPatricia Yizel Canby RN  MD notified (1st page):  Not required, order set utilized  Time of first page:  1555  MD notified (2nd page):  Time of second page:  Responding MD:  Not applicable, order set utilized  Time MD responded:  Not applicable, order set utilized

## 2015-05-12 NOTE — Progress Notes (Signed)
[facility] PROGRESS NOTE  [name] ZOX:[id number] DOB: 1951/09/07 DOA: 05/04/2015 PCP: Jeanann Lewandowsky, MD  Assessment/Plan: #1 acute on chronic respiratory failure with hypoxia Patient now with worsening respiratory status with thoracoabdominal breathing and significant hypoxia requiring Ventimask and now to be placed on the BiPAP. Chest x-ray showed a new right upper lobe pneumonia. Patient's worsening worsening respiratory status likely multifactorial secondary to new right upper lobe pneumonia in the setting of a COPD exacerbation and acute on chronic diastolic heart failure. Patient is diuresing well. Check a urine Legionella antigen. Check a urine strep pneumococcus antigen. Check ABG. Will broaden antibiotic coverage to IV vancomycin and IV cefepime. Place on Solu-Medrol 60 mg IV daily. Continue bronchodilators, nebulizer treatments, mucolytics. Change IV Lasix to oral Lasix. Transferred to the stepdown unit. Place on BiPAP. Will consult with critical care medicine for further evaluation and management.   #2 HCAP Per chest x-ray. Patient went worsening respiratory status. Check a urine Legionella antigen. Check a urine pneumococcus antigen. Broaden antibiotic coverage to IV vancomycin and IV cefepime. Will change oral prednisone to IV Solu-Medrol. Continue nebulizer treatments, mucolytics, antitussives.  #2 acute COPD exacerbation Patient currently back in acute on chronic respiratory distress with increased O2 requirements. Chest x-ray with a new right upper lobe pneumonia. Patient with some wheezing on examination and use of accessory muscles of respiration.  Patient still with wheezing and some cough as well as sputum production and some upper airway noise. Oral Levaquin has been broadened to IV vancomycin and IV cefepime in light of new right upper lobe pneumonia noted on chest x-ray. Continue bronchodilators, mucolytics, antitussives. Change oral prednisone to IV Solu-Medrol  60 mg daily. PCCM consult pending.  #3 acute on chronic diastolic CHF Some clinical improvement. 2-D echo with EF of 60-65% with no wall motion abnormalities. Grade 1 diastolic dysfunction. I/O equal -3.67 L/during this hospitalization. Continue aspirin, metoprolol, lisinopril. Change Lasix to by mouth. Follow.  #4 chest pain Likely secondary to cough or respiratory distress. Patient history of coronary artery disease. Cardiac enzymes negative 3. 2-D echo with EF of 60-65% with no wall motion abnormalities. Patient currently asymptomatic. Continue current regimen.  #5 diabetes mellitus type 2 Last hemoglobin A1c was 7.4. Oral hypoglycemic agents on hold. CBG 83-93. Decrease Lantus 5 units daily as patient is on steroids taper. Continue resistant sliding scale insulin. Follow.  #6 weakness PT following.  #7 prophylaxis Lovenox for DVT prophylaxis.  Code Status: Full Family Communication: Updated patient no family at bedside. Disposition Plan: Transfer to stepdown unit.   Consultants:  None  Procedures:  Chest x-ray 05/04/2015, 05/12/2015  2-D echo 05/07/2015    Antibiotics:  IV azithromycin 05/06/2015>>>>05/08/15  IV Rocephin 05/06/2015>>>>>05/08/15  Oral levaquin 05/08/15>>>>> 05/12/2015  IV vancomycin 05/12/2015  IV cefepime 05/12/2015  HPI/Subjective: Events overnight noted. Patient complaining of significant shortness of breath. Patient noted to be hypoxic overnight with sats dropping into the 70s. Patient on Ventimask. Patient with use of accessory muscles of respiration with thoracoabdominal breathing. Patient denies any chest pain.  Objective: Filed Vitals:   05/12/15 0815  BP: 124/67  Pulse: 106  Temp:   Resp: 32    Intake/Output Summary (Last 24 hours) at 05/12/15 0834 Last data filed at 05/12/15 0653  Gross per 24 hour  Intake   1280 ml  Output    875 ml  Net    405 ml   Filed Weights   05/10/15 0449 05/11/15 0412 05/12/15 0522  Weight: 85.412  kg (188 lb  4.8 oz) 84.8 kg (186 lb 15.2 oz) 83.992 kg (185 lb 2.7 oz)    Exam:   General:  Patient in mild respiratory distress on Ventimask. Use of accessory muscles of respiration. Thoracoabdominal breathing.  Cardiovascular: Tachycardic  Respiratory: Min-mild expiratory wheezes. Coarse breath sounds with some gurgling noted. Thoracoabdominal breathing. Use of accessory muscles of respiration.  Abdomen: Soft, nontender, nondistended, positive bowel sounds.  Musculoskeletal: No clubbing cyanosis. 1-2+ bilateral lower extremity edema improving.  Data Reviewed: Basic Metabolic Panel:  Recent Labs Lab 05/08/15 0530 05/09/15 0546 05/10/15 0349 05/11/15 0600 05/12/15 0315  NA 138 137 139 138 136  K 3.5 3.9 4.4 3.3* 3.7  CL 90* 90* 90* 89* 86*  CO2 36* 38* 37* 42* 38*  GLUCOSE 258* 292* 220* 144* 81  BUN 16 19 20 15 18   CREATININE 0.50* 0.46* 0.53* 0.45* 0.54*  CALCIUM 9.1 8.8* 8.8* 8.7* 8.5*   Liver Function Tests:  Recent Labs Lab 05/10/15 0349  AST 26  ALT 79*  ALKPHOS 143*  BILITOT 0.3  PROT 5.9*  ALBUMIN 2.2*   No results for input(s): LIPASE, AMYLASE in the last 168 hours. No results for input(s): AMMONIA in the last 168 hours. CBC:  Recent Labs Lab 05/08/15 0530 05/09/15 0546 05/10/15 0349 05/11/15 0600 05/12/15 0315  WBC 10.2 8.4 10.1 5.0 6.2  HGB 8.7* 7.4* 8.5* 8.7* 9.4*  HCT 28.0* 24.6* 28.1* 29.3* 31.7*  MCV 91.5 91.8 93.0 92.1 91.1  PLT 175 192 218 198 224   Cardiac Enzymes:  Recent Labs Lab 05/05/15 1151 05/05/15 1820  TROPONINI <0.03 0.04*   BNP (last 3 results)  Recent Labs  05/04/15 2318  BNP 136.0*    ProBNP (last 3 results)  Recent Labs  10/27/14 2311  PROBNP 89.0    CBG:  Recent Labs Lab 05/11/15 1118 05/11/15 1627 05/11/15 2134 05/12/15 0144 05/12/15 0557  GLUCAP 265* 80 94 83 93    No results found for this or any previous visit (from the past 240 hour(s)).   Studies: Dg Chest Port 1  View  05/12/2015   CLINICAL DATA:  Dyspnea  EXAM: PORTABLE CHEST - 1 VIEW  COMPARISON:  05/04/2015  FINDINGS: New ill-defined airspace disease in the right upper lung.  Background of hyperinflation and emphysematous change. No edema, effusion, or pneumothorax.  Normal heart size and mediastinal contours.  IMPRESSION: 1. Right upper lobe pneumonia, new from 05/04/2015. 2. Emphysema.   Electronically Signed   By: Marnee SpringJonathon  Watts M.D.   On: 05/12/2015 03:14    Scheduled Meds: . acetylcysteine  4 mL Nebulization Q4H  . antiseptic oral rinse  7 mL Mouth Rinse BID  . arformoterol  15 mcg Nebulization BID  . aspirin  325 mg Oral q morning - 10a  . budesonide (PULMICORT) nebulizer solution  0.25 mg Nebulization BID  . ceFEPime (MAXIPIME) IV  1 g Intravenous 3 times per day  . clopidogrel  75 mg Oral Daily  . enoxaparin (LOVENOX) injection  40 mg Subcutaneous Q24H  . fluticasone  2 spray Each Nare BID  . furosemide  40 mg Intravenous BID  . guaiFENesin  1,200 mg Oral BID  . insulin aspart  0-20 Units Subcutaneous TID WC  . insulin glargine  10 Units Subcutaneous Daily  . ipratropium-albuterol  3 mL Nebulization Q4H  . lisinopril  2.5 mg Oral Daily  . metoprolol  50 mg Oral BID  . nitroGLYCERIN  0.5 inch Topical 4 times per day  . pantoprazole  40  mg Oral Q1200  . potassium chloride SA  40 mEq Oral Daily  . sodium chloride  3 mL Intravenous Q12H  . vancomycin  1,000 mg Intravenous 3 times per day   Continuous Infusions:   Principal Problem:   Acute on chronic respiratory failure Active Problems:   HCAP (healthcare-associated pneumonia)   Essential hypertension   Coronary atherosclerosis   Acute on chronic respiratory failure with hypoxia   Bilateral lower extremity edema   COPD exacerbation   Diabetes   Acute on chronic diastolic CHF (congestive heart failure)   Weakness of both legs   History of CHF (congestive heart failure)   Other chest pain   DM (diabetes mellitus) type II  uncontrolled, periph vascular disorder    Time spent: 35 minutes    THOMPSON,DANIEL M.D. [facility] Pager [phone]. If 7PM-7AM, please contact night-coverage at [email or web address], password [facility] 05/12/2015, 8:34 AM  LOS: 7 days

## 2015-05-12 NOTE — Progress Notes (Signed)
PT Cancellation Note  Patient Details Name: [name] MRN: [id number] DOB: 08/06/1951   Cancelled Treatment:    Reason Eval/Treat Not Completed: Medical issues which prohibited therapy, pt with decline in respiratory status, transferred to SDU. Spoke with RN who advised PT hold today. Will check back tomorrow.    [name], [place] 05/12/2015, 10:21 AM

## 2015-05-12 NOTE — Care Management Note (Signed)
Case Management Note  Patient Details  Name: [name] MRN: [id number] Date of Birth: 07/14/1951  Subjective/Objective:                    Action/Plan:   Expected Discharge Date:                  Expected Discharge Plan:  Home w Home Health Services  In-House Referral:     Discharge planning Services  CM Consult  Post Acute Care Choice:  Home Health Choice offered to:  Patient  DME Arranged:    DME Agency:     HH Arranged:  RN, PT, OT, Nurse's Aide HH [facility]  Status of Service:  Completed, signed off  Medicare Important Message Given:  Yes Date Medicare IM Given:  05/12/15 Medicare IM give by:  debbie Daiden Coltrane rn,bsn Date Additional Medicare IM Given:    Additional Medicare Important Message give by:     If discussed at Long Length of Stay Meetings, dates discussed:  05/13/15  Additional Comments:  Hanley HaysDowell, Cagney Steenson T, RN 05/12/2015, 11:17 AM

## 2015-05-12 NOTE — Progress Notes (Signed)
Report given to Dennie BiblePat, RN on [facility].

## 2015-05-12 NOTE — Progress Notes (Signed)
Informed by pt's nurse this am that pt having mental status changes.  Pt A&0x2 self and name of president.  Note pt using accessory muscle when breathing at a rate of 32.  Audible rhonchi bilaterly.  BP 124/67, p106, 02 97% on 50% vent. Mask.  Primary nurse notified rapid response nurse and MD.  Will continue to monitor.  Amanda PeaNellie Corrisa Gibby, Charity fundraiserN.

## 2015-05-12 NOTE — Consult Note (Signed)
PULMONARY / CRITICAL CARE MEDICINE   Name: [name] MRN: [id number] DOB: 13-Nov-1951    ADMISSION DATE:  05/04/2015 CONSULTATION DATE:  5/23  REFERRING MD :  Janee Morn   CHIEF COMPLAINT:   Acute respiratory failure   INITIAL PRESENTATION:  64 yr male w/ chronic respiratory failure (on 3 liters at home) f/b [place] w/ COPD, also has h/o CAD and gd I diastolic dysfxn. Initially admitted 5/15 w/ working dx of AECOPD +/- decompensated diastolic heart failure and volume overload. Had been making slow improvements until the am of 5/23 when he developed acute respiratory distress and progressive hypoxia and hypercarbia. CXR w/ new RUL airspace disease. PCCM asked to assist w/ care.    STUDIES:  ECHO 5/18: EF 60-65%; Doppler parameters are consistent with abnormal left ventricularrelaxation (grade 1 diastolic dysfunction).  SIGNIFICANT EVENTS:    HISTORY OF PRESENT ILLNESS:    64 yr male w/ chronic respiratory failure (on 3 liters at home); and calculated baseline CO2 ~ , initially presented on 5/19 with 24 hr h/o progressive dyspnea and wheeze. Admitted w/ working dx of AECOPD c/b decompensated HF. He was treated in the usual fashion which included: empiric antibiotics, scheduled BDs, systemic steroids, oxygen, and diuresis for the Heart failure component. He had been making slow progress, Then on the am of 5/23 he began to have increased work of breathing. Had no relief w/ rescue SABA. His gas exchange got progressively worse. ABG on venturi mask: w/ PCO2 74 (baseline 60s); and PAO2 117 on 50%, had marked accessory muscle use w/ paroxysmal respiratory efforts, PCCM called to assist w/ care. On arrival pt was in acute distress. He was mottled, unable to complete 2 word phrases, and had not responded to therapy (O2 and lasix) given prior to team arrival. He was moved to the intensive care for acute on chronic respiratory failure.  PAST MEDICAL HISTORY :   has a past medical history of COPD  (chronic obstructive pulmonary disease); CAD (coronary artery disease); Hypertension; Hyperlipidemia; Osteomyelitis; Myocardial infarction; Anginal pain; Asthma; Pneumonia; Shortness of breath; On home oxygen therapy; GERD (gastroesophageal reflux disease); Daily headache; Stroke; Arthritis; Osteomyelitis hip; and Chronic lower back pain.  has past surgical history that includes Appendectomy (4098); Cardiac catheterization (? 2009; ?2012); and Coronary angioplasty with stent (2006; ?2008; ?2010). Prior to Admission medications   Medication Sig Start Date End Date Taking? Authorizing Provider  albuterol (PROVENTIL) (2.5 MG/3ML) 0.083% nebulizer solution Take 3 mLs (2.5 mg total) by nebulization every 6 (six) hours as needed for wheezing or shortness of breath. 11/04/14  Yes Shanker Levora Dredge, MD  aspirin 325 MG tablet Take 1 tablet (325 mg total) by mouth every morning. 11/04/14  Yes Shanker Levora Dredge, MD  clopidogrel (PLAVIX) 75 MG tablet Take 1 tablet (75 mg total) by mouth daily. 11/04/14  Yes Shanker Levora Dredge, MD  fluticasone (FLONASE) 50 MCG/ACT nasal spray Place 2 sprays into both nostrils 2 (two) times daily. 11/04/14  Yes Shanker Levora Dredge, MD  furosemide (LASIX) 40 MG tablet Take 40 mg by mouth daily.   Yes Historical Provider, MD  ipratropium-albuterol (DUONEB) 0.5-2.5 (3) MG/3ML SOLN Take 3 mLs by nebulization every 4 (four) hours as needed (sob).   Yes Historical Provider, MD  metFORMIN (GLUCOPHAGE) 500 MG tablet Take 1 tablet (500 mg total) by mouth 2 (two) times daily with a meal. 11/04/14  Yes Shanker Levora Dredge, MD  metoprolol (LOPRESSOR) 50 MG tablet Take 1 tablet (50 mg total) by mouth 2 (  two) times daily. 11/04/14  Yes Shanker Levora DredgeM Ghimire, MD  Oxycodone-Acetaminophen (PERCOCET PO) Take 1-2 tablets by mouth every 6 (six) hours as needed (pain).   Yes Historical Provider, MD  pantoprazole (PROTONIX) 40 MG tablet Take 1 tablet (40 mg total) by mouth daily at 12 noon. 11/04/14  Yes Shanker Levora DredgeM  Ghimire, MD  potassium chloride SA (K-DUR,KLOR-CON) 20 MEQ tablet Take 1 tablet (20 mEq total) by mouth daily. 11/04/14  Yes Shanker Levora DredgeM Ghimire, MD  predniSONE (DELTASONE) 10 MG tablet Take 3 pills (30 mg) daily 01/23/15  Yes Leslye Peerobert S [place]rum, MD  terbutaline (BRETHINE) 2.5 MG tablet Take 1 tablet (2.5 mg total) by mouth every 6 (six) hours. 11/04/14  Yes Shanker Levora DredgeM Ghimire, MD  traMADol (ULTRAM) 50 MG tablet Take 1 tablet (50 mg total) by mouth every 6 (six) hours as needed (for breakthrough pain). 11/04/14  Yes Shanker Levora DredgeM Ghimire, MD  traZODone (DESYREL) 50 MG tablet Take 1 tablet (50 mg total) by mouth at bedtime as needed for sleep. 11/04/14  Yes Shanker Levora DredgeM Ghimire, MD  albuterol (PROVENTIL HFA;VENTOLIN HFA) 108 (90 BASE) MCG/ACT inhaler Inhale 2 puffs into the lungs every 4 (four) hours as needed for wheezing or shortness of breath. Patient not taking: Reported on 05/05/2015 11/04/14   Maretta BeesShanker M Ghimire, MD  albuterol (PROVENTIL) (2.5 MG/3ML) 0.083% nebulizer solution Take 3 mLs (2.5 mg total) by nebulization every 6 (six) hours as needed for wheezing or shortness of breath. Dx 496 11/04/14   Shanker Levora DredgeM Ghimire, MD  arformoterol (BROVANA) 15 MCG/2ML NEBU Take 2 mLs (15 mcg total) by nebulization 2 (two) times daily. Patient not taking: Reported on 05/05/2015 11/04/14   Maretta BeesShanker M Ghimire, MD  furosemide (LASIX) 20 MG tablet TAKE 1 TABLET (20 MG TOTAL) BY MOUTH DAILY. 09/13/14   Leslye Peerobert S [place]rum, MD  furosemide (LASIX) 20 MG tablet Take 2 tablets (40 mg total) by mouth daily. 11/04/14   Shanker Levora DredgeM Ghimire, MD  ipratropium (ATROVENT) 0.02 % nebulizer solution Take 2.5 mLs (0.5 mg total) by nebulization every 4 (four) hours as needed for wheezing or shortness of breath. 12/27/14   Leslye Peerobert S [place]rum, MD  mometasone-formoterol (DULERA) 100-5 MCG/ACT AERO Inhale 2 puffs into the lungs 2 (two) times daily. Patient not taking: Reported on 05/05/2015 11/20/14   Quentin Angstlugbemiga E Jegede, MD  nystatin (MYCOSTATIN) 100000 UNIT/ML  suspension Take 5 mLs (500,000 Units total) by mouth 4 (four) times daily. Patient not taking: Reported on 05/05/2015 01/09/14   Alba CoryBelkys A Regalado, MD   Allergies  Allergen Reactions  . Lipitor [Atorvastatin Calcium] Other (See Comments)    Muscle aches    FAMILY HISTORY:  has no family status information on file.  SOCIAL HISTORY:  reports that he quit smoking about 7 months ago. His smoking use included Cigarettes. He has a 12.75 pack-year smoking history. He has never used smokeless tobacco. He reports that he does not drink alcohol or use illicit drugs.  REVIEW OF SYSTEMS:  Unable   VITAL SIGNS: Temp:  [97.3 F (36.3 C)-98.4 F (36.9 C)] 97.3 F (36.3 C) (05/23 0522) Pulse Rate:  [82-120] 107 (05/23 0918) Resp:  [16-32] 18 (05/23 0918) BP: (105-138)/(65-74) 105/68 mmHg (05/23 0915) SpO2:  [92 %-100 %] 99 % (05/23 0933) FiO2 (%):  [30 %-55 %] 30 % (05/23 0933) Weight:  [83.992 kg (185 lb 2.7 oz)] 83.992 kg (185 lb 2.7 oz) (05/23 0522) HEMODYNAMICS:   VENTILATOR SETTINGS: Vent Mode:  [-]  FiO2 (%):  [30 %-55 %]  30 % INTAKE / OUTPUT:  Intake/Output Summary (Last 24 hours) at 05/12/15 0954 Last data filed at 05/12/15 0913  Gross per 24 hour  Intake   1330 ml  Output    875 ml  Net    455 ml    PHYSICAL EXAMINATION: General:  Chronically ill appearing white male, now on BIPAP. Work of breathing appears improved  Neuro:  Opens eyes, can answer questions, oriented X 2, moves all ext  HEENT:  Neck large,  No clear JVD, marked course upper airway wheeze  Cardiovascular:  RRR; ST on tele  Lungs:  Prolonged exp wheeze. Marked accessory muscle use scattered rhonchi  Abdomen:  Distended; tympanic to percussion, hypoactive bowel sounds..  Musculoskeletal:  Intact  Skin:  Mottled, 4+ pitting edema   LABS:  CBC  Recent Labs Lab 05/10/15 0349 05/11/15 0600 05/12/15 0315  WBC 10.1 5.0 6.2  HGB 8.5* 8.7* 9.4*  HCT 28.1* 29.3* 31.7*  PLT 218 198 224   Coag's No results  for input(s): APTT, INR in the last 168 hours. BMET  Recent Labs Lab 05/10/15 0349 05/11/15 0600 05/12/15 0315  NA 139 138 136  K 4.4 3.3* 3.7  CL 90* 89* 86*  CO2 37* 42* 38*  BUN CREATININE 0.53* 0.45* 0.54*  GLUCOSE 220* 144* 81   Electrolytes  Recent Labs Lab 05/10/15 0349 05/11/15 0600 05/12/15 0315  CALCIUM 8.8* 8.7* 8.5*   Sepsis Markers No results for input(s): LATICACIDVEN, PROCALCITON, O2SATVEN in the last 168 hours. ABG  Recent Labs Lab 05/12/15 0303 05/12/15 0906  PHART 7.429 7.359  PCO2ART 64.2* 74.2*  PO2ART 109* 117*   Liver Enzymes  Recent Labs Lab 05/10/15 0349  AST 26  ALT 79*  ALKPHOS 143*  BILITOT 0.3  ALBUMIN 2.2*   Cardiac Enzymes  Recent Labs Lab 05/05/15 1151 05/05/15 1820  TROPONINI <0.03 0.04*   Glucose  Recent Labs Lab 05/11/15 0536 05/11/15 1118 05/11/15 1627 05/11/15 2134 05/12/15 0144 05/12/15 0557  GLUCAP 149* 265* 80 94 83 93    Imaging No results found.   ASSESSMENT / PLAN:  PULMONARY OETT A: Acute on Chronic Respiratory failure in the setting of RUL pneumonia, further complicated by element of vocal cord dysfunction/ upper airway irritation, and AECOPD. Seems as though the upper airway component is a major factor in his symptom burden. Seems a little better w/ positive pressure/ upper airway stenting. Need to r/o additional acute cardiac event.  P:   Move to ICU  Trial NIPPV, repeat ABG and exam after arrival; decide on intubation based on work of breathing.  Scheduled BDs Cont systemic steroids Limit sedating meds D/c mucomyst (will aggravate upper airway) Maximize GERD rx  See ID section    CARDIOVASCULAR CVL A:  H/o gd I diastolic dysfunction  H/o CAD Sepsis Hypotension r/o septic shock  He's mottled on exam. ? Occult shock vs simply work of breathing  P:  12 lead Ck lactic acid after 1/2 hr on NIPPV  Cycle CEs d/c further lasix for now;  if LA elevated will need to  initiate 30 ml/kg fluid challenge   RENAL A:   No acute/ has chronic hypercarbic resp failure w./ metabolic compensation     P:   Trend K w/ earlier diuresis  KVO for now Serial chemistries  Strict I&O  GASTROINTESTINAL A:   No acute  P:   NPO  PPI   HEMATOLOGIC A:   Chronic anemia  P:  Trend  cbc LMWH    INFECTIOUS A:  Probable HCAP P:   BCx2 5/23>>> maxipime 5/23>>> vanc 5/23>>>  ENDOCRINE A:   DM  P:   Trend glucose  NEUROLOGIC A:   Acute encephalopathy in setting of acute respiratory failure and hypoxia  P:   RASS goal: 0 Hold all sedation  Supportive care.    FAMILY  - Updates: pending   - Inter-disciplinary family meet or Palliative Care meeting due by: 5/30     TODAY'S SUMMARY:  64 year old male w/ chronic resp failure on basis of COPD. Admitted w/ prob acute exacerbation of COPD +/- volume overload. Had improved. Now acutely worse w/ new RUL airspace disease. His work of breathing seems out of proportion to his airspace disease on CXR. Now moved to ICU w/ worsening acute on chronic resp failure. His upper airway seems to be responsible for most of his symptom burden. His new RUL infiltrate could represent PNA vs possible asymmetric edema. For now plan is to widen for HCAP coverage, cont NIPPV to stent airway open, hold further lasix given hypotension and SIRS/sepsis markers and re-assess w/ lactic acid and ABG. May need intubation.   Simonne Martinet ACNP-BC [facility] Pulmonary/Critical Care Pager # [phone] OR # [phone] if no answer

## 2015-05-12 NOTE — Progress Notes (Signed)
[facility] PCCM  More awake now, breathing comfortably, PICC in place, some nausea  BIPAP to PRN Titrate O2 as needed for O2 sat > 92% zofran prn Regular diet  Heber CarolinaBrent Saadiya Wilfong, MD [facility] Pager: [phone] Cell: [phone] If no response, call [phone]

## 2015-05-12 NOTE — Progress Notes (Signed)
Peripherally Inserted Central Catheter/Midline Placement  The IV Nurse has discussed with the patient and/or persons authorized to consent for the patient, the purpose of this procedure and the potential benefits and risks involved with this procedure.  The benefits include less needle sticks, lab draws from the catheter and patient may be discharged home with the catheter.  Risks include, but not limited to, infection, bleeding, blood clot (thrombus formation), and puncture of an artery; nerve damage and irregular heat beat.  Alternatives to this procedure were also discussed.  PICC/Midline Placement Documentation        [name], [name] 05/12/2015, 1:08 PM Consent was signed by Anders SimmondsPete Babcock, PA as medical necessity because unable to contact family for consent.

## 2015-05-12 NOTE — Progress Notes (Signed)
ANTIBIOTIC CONSULT NOTE - INITIAL  Pharmacy Consult for Vancomycin Indication: pneumonia  Allergies  Allergen Reactions  . Lipitor [Atorvastatin Calcium] Other (See Comments)    Muscle aches    Patient Measurements: Height: 5\' 11"  (180.3 cm) Weight: 185 lb 2.7 oz (83.992 kg) IBW/kg (Calculated) : 75.3   Vital Signs: Temp: 97.3 F (36.3 C) (05/23 0522) Temp Source: Oral (05/23 0522) BP: 138/74 mmHg (05/23 0522) Pulse Rate: 102 (05/23 0522) Intake/Output from previous day: 05/22 0701 - 05/23 0700 In: 1080 [P.O.:1080] Out: 1200 [Urine:1200] Intake/Output from this shift: Total I/O In: 720 [P.O.:720] Out: 500 [Urine:500]  Labs:  Recent Labs  05/10/15 0349 05/11/15 0600 05/12/15 0315  WBC 10.1 5.0 6.2  HGB 8.5* 8.7* 9.4*  PLT 218 198 224  CREATININE 0.53* 0.45* 0.54*   Estimated Creatinine Clearance: 98 mL/min (by C-G formula based on Cr of 0.54). No results for input(s): VANCOTROUGH, VANCOPEAK, VANCORANDOM, GENTTROUGH, GENTPEAK, GENTRANDOM, TOBRATROUGH, TOBRAPEAK, TOBRARND, AMIKACINPEAK, AMIKACINTROU, AMIKACIN in the last 72 hours.   Microbiology: No results found for this or any previous visit (from the past 720 hour(s)).  Medical History: Past Medical History  Diagnosis Date  . COPD (chronic obstructive pulmonary disease)   . CAD (coronary artery disease)   . Hypertension   . Hyperlipidemia   . Osteomyelitis   . Myocardial infarction     "I've had a couple since 2006" (12/26/2013)  . Anginal pain   . Asthma   . Pneumonia     "have had it several times; maybe now too": (12/26/2013)  . Shortness of breath     "all the time" (12/26/2013)  . On home oxygen therapy     "2-3L; 24/7" (12/26/2013)  . GERD (gastroesophageal reflux disease)   . Daily headache   . Stroke     "they say I've had a couple" denies residual on 12/26/2013  . Arthritis     "hands; feet" (12/26/2013)  . Osteomyelitis hip     "right"  . Chronic lower back pain     Medications:   Prescriptions prior to admission  Medication Sig Dispense Refill Last Dose  . albuterol (PROVENTIL) (2.5 MG/3ML) 0.083% nebulizer solution Take 3 mLs (2.5 mg total) by nebulization every 6 (six) hours as needed for wheezing or shortness of breath. 150 mL 9 05/04/2015 at Unknown time  . aspirin 325 MG tablet Take 1 tablet (325 mg total) by mouth every morning. 30 tablet 3 05/04/2015 at Unknown time  . clopidogrel (PLAVIX) 75 MG tablet Take 1 tablet (75 mg total) by mouth daily. 31 tablet 3 05/04/2015 at Unknown time  . fluticasone (FLONASE) 50 MCG/ACT nasal spray Place 2 sprays into both nostrils 2 (two) times daily. 16 g 2 unknown prn  . furosemide (LASIX) 40 MG tablet Take 40 mg by mouth daily.   05/04/2015 at Unknown time  . ipratropium-albuterol (DUONEB) 0.5-2.5 (3) MG/3ML SOLN Take 3 mLs by nebulization every 4 (four) hours as needed (sob).   05/04/2015 at Unknown time  . metFORMIN (GLUCOPHAGE) 500 MG tablet Take 1 tablet (500 mg total) by mouth 2 (two) times daily with a meal. 180 tablet 3 05/04/2015 at Unknown time  . metoprolol (LOPRESSOR) 50 MG tablet Take 1 tablet (50 mg total) by mouth 2 (two) times daily. 60 tablet 5 05/04/2015 at 1700  . Oxycodone-Acetaminophen (PERCOCET PO) Take 1-2 tablets by mouth every 6 (six) hours as needed (pain).   Past Month at Unknown time  . pantoprazole (PROTONIX) 40 MG tablet Take 1  tablet (40 mg total) by mouth daily at 12 noon. 30 tablet 3 05/04/2015 at Unknown time  . potassium chloride SA (K-DUR,KLOR-CON) 20 MEQ tablet Take 1 tablet (20 mEq total) by mouth daily. 30 tablet 3 05/04/2015 at Unknown time  . predniSONE (DELTASONE) 10 MG tablet Take 3 pills (30 mg) daily 160 tablet 3 05/04/2015 at Unknown time  . terbutaline (BRETHINE) 2.5 MG tablet Take 1 tablet (2.5 mg total) by mouth every 6 (six) hours. 120 tablet 1 unknown at Unknown time  . traMADol (ULTRAM) 50 MG tablet Take 1 tablet (50 mg total) by mouth every 6 (six) hours as needed (for breakthrough pain).  60 tablet 0 Past Month at Unknown time  . traZODone (DESYREL) 50 MG tablet Take 1 tablet (50 mg total) by mouth at bedtime as needed for sleep. 30 tablet 0 Past Month at Unknown time  . albuterol (PROVENTIL HFA;VENTOLIN HFA) 108 (90 BASE) MCG/ACT inhaler Inhale 2 puffs into the lungs every 4 (four) hours as needed for wheezing or shortness of breath. (Patient not taking: Reported on 05/05/2015) 1 Inhaler 11 Not Taking at Unknown time  . albuterol (PROVENTIL) (2.5 MG/3ML) 0.083% nebulizer solution Take 3 mLs (2.5 mg total) by nebulization every 6 (six) hours as needed for wheezing or shortness of breath. Dx 496 360 mL 11   . arformoterol (BROVANA) 15 MCG/2ML NEBU Take 2 mLs (15 mcg total) by nebulization 2 (two) times daily. (Patient not taking: Reported on 05/05/2015) 120 mL 0 Not Taking at Unknown time  . furosemide (LASIX) 20 MG tablet TAKE 1 TABLET (20 MG TOTAL) BY MOUTH DAILY. 90 tablet 1 Taking  . furosemide (LASIX) 20 MG tablet Take 2 tablets (40 mg total) by mouth daily. 30 tablet 3   . ipratropium (ATROVENT) 0.02 % nebulizer solution Take 2.5 mLs (0.5 mg total) by nebulization every 4 (four) hours as needed for wheezing or shortness of breath. 300 mL 5   . mometasone-formoterol (DULERA) 100-5 MCG/ACT AERO Inhale 2 puffs into the lungs 2 (two) times daily. (Patient not taking: Reported on 05/05/2015) 3 Inhaler 3 Not Taking at Unknown time  . nystatin (MYCOSTATIN) 100000 UNIT/ML suspension Take 5 mLs (500,000 Units total) by mouth 4 (four) times daily. (Patient not taking: Reported on 05/05/2015) 60 mL 0 Completed Course at Unknown time   Scheduled:  . acetylcysteine  4 mL Nebulization Q4H  . antiseptic oral rinse  7 mL Mouth Rinse BID  . arformoterol  15 mcg Nebulization BID  . aspirin  325 mg Oral q morning - 10a  . budesonide (PULMICORT) nebulizer solution  0.25 mg Nebulization BID  . ceFEPime (MAXIPIME) IV  1 g Intravenous 3 times per day  . clopidogrel  75 mg Oral Daily  . enoxaparin  (LOVENOX) injection  40 mg Subcutaneous Q24H  . fluticasone  2 spray Each Nare BID  . furosemide  40 mg Intravenous BID  . guaiFENesin  1,200 mg Oral BID  . insulin aspart  0-20 Units Subcutaneous TID WC  . insulin glargine  10 Units Subcutaneous Daily  . ipratropium-albuterol  3 mL Nebulization Q4H  . lisinopril  2.5 mg Oral Daily  . metoprolol  50 mg Oral BID  . nitroGLYCERIN  0.5 inch Topical 4 times per day  . pantoprazole  40 mg Oral Q1200  . potassium chloride SA  40 mEq Oral Daily  . predniSONE  60 mg Oral QAC breakfast  . sodium chloride  3 mL Intravenous Q12H  . vancomycin  1,000 mg Intravenous Q8H   Assessment: 64 y.o male with acute on chronic respiratory failure with hypoxia on day #6 of antibiotics. Levofloxacin DC'd.  Now starting IV cefepime and vancomycin for pneumonia.  SCr 0.54, estimated CrCl ~ 98 ml/min.  Goal of Therapy:  Vancomycin trough level 15-20 mcg/ml  Plan:  Vancomycin 1000 mg IV q8hr No change in cefepime dose. Continue Cefepime 1g IV q8h Monitor clinical status, renal function and culture results daily. Check vancomycin trough after 3rd dose given.  Thank you for allowing pharmacy to be part of this patients care team. Noah Delaine, RPh Clinical Pharmacist Pager: [phone] 05/12/2015,6:04 AM

## 2015-05-12 NOTE — Significant Event (Signed)
Rapid Response Event Note  Overview: Time Called: 0815 Arrival Time: 0820 Event Type: Respiratory  Initial Focused Assessment: Patient with increased WOB, desats with movement on 50% Venturi mask. Patient states that he is very SOB. Lung sounds with upper airway wheezes and rhonchi, poor airmovement Knees mottled  Interventions: Patient with little reserve.  Desats quickly with any movement and takes a few minutes to recover. Dr Janee Mornhompson at bedside to assess patient. Anders SimmondsPete Babcock NP at bedside to assess patient.  Unable to start 2nd IV site. ABG done Respiratory treatment given  Transferred to 2H12 via bed with O2 and heart monitor.  Plan Bipap.  RN to notify family.  Event Summary: Name of Physician Notified: Dr Janee Mornhompson at [phone]  Name of Consulting Physician Notified: PCCM, Anders SimmondsPete Babcock NP at 0830  Outcome: Transferred (Comment) [phone](2H12)  Event End Time: [id number]  [name], [name]

## 2015-05-12 NOTE — Progress Notes (Signed)
Received from night shift RN that patient was A&Ox4, when day shift RN went to complete morning assessment pt was only A&Ox2, confused. Pt is using accessory muscles to breath. Pt is on a venti mask and is pulling mask off. O2 sats drop into the 70's. Rapid response called. Respiratory is on the floor, suggesting the patient needs to be on continuous bipap. MD has been paged. MD has put in orders for patient to be transferred to stepdown.

## 2015-05-12 NOTE — Progress Notes (Signed)
Respiratory called with panic ABG.  Made aware that patient transferred to 2H but still wanted to give results pH 73.5, PO2 117, PCO2 74.6150m, Bicarb 40.  RN who received patient on 2H made aware of results.

## 2015-05-12 NOTE — Progress Notes (Addendum)
RT called to room due to pt in distress. Breathing tx treatment given with little relief. Pulled pt up in bed and placed on 55% VM to give pt more flow. MD notified. Orders recived for ABG.

## 2015-05-13 ENCOUNTER — Other Ambulatory Visit: Payer: Self-pay

## 2015-05-13 DIAGNOSIS — E1159 Type 2 diabetes mellitus with other circulatory complications: Secondary | ICD-10-CM

## 2015-05-13 DIAGNOSIS — K219 Gastro-esophageal reflux disease without esophagitis: Secondary | ICD-10-CM

## 2015-05-13 DIAGNOSIS — J9621 Acute and chronic respiratory failure with hypoxia: Secondary | ICD-10-CM

## 2015-05-13 LAB — BASIC METABOLIC PANEL
Anion gap: 11 (ref 5–15)
BUN: 12 mg/dL (ref 6–20)
CHLORIDE: 88 mmol/L — AB (ref 101–111)
CO2: 37 mmol/L — ABNORMAL HIGH (ref 22–32)
Calcium: 8.3 mg/dL — ABNORMAL LOW (ref 8.9–10.3)
Creatinine, Ser: 0.42 mg/dL — ABNORMAL LOW (ref 0.61–1.24)
Glucose, Bld: 200 mg/dL — ABNORMAL HIGH (ref 65–99)
POTASSIUM: 3.8 mmol/L (ref 3.5–5.1)
Sodium: 136 mmol/L (ref 135–145)

## 2015-05-13 LAB — CBC WITH DIFFERENTIAL/PLATELET
Basophils Absolute: 0 10*3/uL (ref 0.0–0.1)
Basophils Relative: 0 % (ref 0–1)
EOS PCT: 0 % (ref 0–5)
Eosinophils Absolute: 0 10*3/uL (ref 0.0–0.7)
HCT: 25.1 % — ABNORMAL LOW (ref 39.0–52.0)
Hemoglobin: 7.7 g/dL — ABNORMAL LOW (ref 13.0–17.0)
LYMPHS ABS: 0.1 10*3/uL — AB (ref 0.7–4.0)
Lymphocytes Relative: 3 % — ABNORMAL LOW (ref 12–46)
MCH: 27.8 pg (ref 26.0–34.0)
MCHC: 30.7 g/dL (ref 30.0–36.0)
MCV: 90.6 fL (ref 78.0–100.0)
Monocytes Absolute: 0.1 10*3/uL (ref 0.1–1.0)
Monocytes Relative: 3 % (ref 3–12)
NEUTROS ABS: 4.9 10*3/uL (ref 1.7–7.7)
Neutrophils Relative %: 94 % — ABNORMAL HIGH (ref 43–77)
PLATELETS: 188 10*3/uL (ref 150–400)
RBC: 2.77 MIL/uL — ABNORMAL LOW (ref 4.22–5.81)
RDW: 17 % — ABNORMAL HIGH (ref 11.5–15.5)
WBC: 5.2 10*3/uL (ref 4.0–10.5)

## 2015-05-13 LAB — LEGIONELLA ANTIGEN, URINE

## 2015-05-13 LAB — GLUCOSE, CAPILLARY
GLUCOSE-CAPILLARY: 261 mg/dL — AB (ref 65–99)
GLUCOSE-CAPILLARY: 112 mg/dL — AB (ref 65–99)
GLUCOSE-CAPILLARY: 108 mg/dL — AB (ref 65–99)
Glucose-Capillary: 128 mg/dL — ABNORMAL HIGH (ref 65–99)

## 2015-05-13 LAB — MAGNESIUM: MAGNESIUM: 2.2 mg/dL (ref 1.7–2.4)

## 2015-05-13 MED ORDER — CETYLPYRIDINIUM CHLORIDE 0.05 % MT LIQD
7.0000 mL | Freq: Two times a day (BID) | OROMUCOSAL | Status: DC
  Administered 2015-05-13 – 2015-05-19 (×8): 7 mL via OROMUCOSAL

## 2015-05-13 MED ORDER — BENZONATATE 100 MG PO CAPS
200.0000 mg | ORAL_CAPSULE | Freq: Three times a day (TID) | ORAL | Status: DC
  Administered 2015-05-13 – 2015-05-19 (×19): 200 mg via ORAL
  Filled 2015-05-13 (×19): qty 2

## 2015-05-13 MED ORDER — METOPROLOL TARTRATE 50 MG PO TABS
50.0000 mg | ORAL_TABLET | Freq: Two times a day (BID) | ORAL | Status: DC
  Administered 2015-05-13 – 2015-05-19 (×13): 50 mg via ORAL
  Filled 2015-05-13 (×8): qty 1
  Filled 2015-05-13: qty 2
  Filled 2015-05-13 (×5): qty 1

## 2015-05-13 MED ORDER — METHYLPREDNISOLONE SODIUM SUCC 40 MG IJ SOLR
40.0000 mg | INTRAMUSCULAR | Status: DC
  Administered 2015-05-13: 40 mg via INTRAVENOUS
  Filled 2015-05-13 (×2): qty 1

## 2015-05-13 MED ORDER — OXYCODONE HCL 5 MG PO TABS
5.0000 mg | ORAL_TABLET | Freq: Four times a day (QID) | ORAL | Status: DC | PRN
  Administered 2015-05-13 – 2015-05-16 (×10): 5 mg via ORAL
  Filled 2015-05-13 (×10): qty 1

## 2015-05-13 MED ORDER — FUROSEMIDE 40 MG PO TABS
40.0000 mg | ORAL_TABLET | Freq: Every day | ORAL | Status: DC
Start: 2015-05-13 — End: 2015-05-19
  Administered 2015-05-13 – 2015-05-19 (×7): 40 mg via ORAL
  Filled 2015-05-13 (×7): qty 1

## 2015-05-13 MED ORDER — INSULIN GLARGINE 100 UNIT/ML ~~LOC~~ SOLN
10.0000 [IU] | Freq: Every day | SUBCUTANEOUS | Status: DC
  Administered 2015-05-13 – 2015-05-19 (×7): 10 [IU] via SUBCUTANEOUS
  Filled 2015-05-13 (×7): qty 0.1

## 2015-05-13 MED ORDER — PANTOPRAZOLE SODIUM 40 MG PO TBEC
40.0000 mg | DELAYED_RELEASE_TABLET | Freq: Two times a day (BID) | ORAL | Status: DC
  Administered 2015-05-13 – 2015-05-19 (×13): 40 mg via ORAL
  Filled 2015-05-13 (×13): qty 1

## 2015-05-13 MED ORDER — ARFORMOTEROL TARTRATE 15 MCG/2ML IN NEBU
15.0000 ug | INHALATION_SOLUTION | Freq: Two times a day (BID) | RESPIRATORY_TRACT | Status: DC
  Administered 2015-05-13 – 2015-05-19 (×11): 15 ug via RESPIRATORY_TRACT
  Filled 2015-05-13 (×18): qty 2

## 2015-05-13 MED ORDER — MAGIC MOUTHWASH
15.0000 mL | Freq: Four times a day (QID) | ORAL | Status: DC | PRN
Start: 2015-05-13 — End: 2015-05-19
  Administered 2015-05-14 – 2015-05-16 (×4): 15 mL via ORAL
  Filled 2015-05-13 (×6): qty 15

## 2015-05-13 MED ORDER — NYSTATIN 100000 UNIT/ML MT SUSP
5.0000 mL | Freq: Four times a day (QID) | OROMUCOSAL | Status: DC
  Administered 2015-05-13 – 2015-05-19 (×25): 500000 [IU] via ORAL
  Filled 2015-05-13 (×26): qty 5

## 2015-05-13 NOTE — Progress Notes (Signed)
Physical Therapy Treatment Patient Details Name: [name] MRN: [id number] DOB: Jun 04, 1951 Today's Date: 05/13/2015    History of Present Illness [name]. is a 64 y.o. male with history of CAD, O2 dependent COPD, Diastolic CHF with EF 55% on last 2D ECHO on 10/2014, HTN, DM2 who resents to the ED with complaints of worsening SOB chest tightness and wheezing  x 1 day.    PT Comments    Pt overall anxious with mobility feeling he is going to fall or be SOB although sats remained in 90s throughout. Pt educated for transfers, RW use, safety with mobility and need to improve in order to go home as his sister can not lift him and currently cannot walk household distances. Will continue to follow.   Follow Up Recommendations  SNF;Supervision/Assistance - 24 hour     Equipment Recommendations  Wheelchair (measurements PT)    Recommendations for Other Services       Precautions / Restrictions Precautions Precautions: Fall Precaution Comments: o2 dependent    Mobility  Bed Mobility Overal bed mobility: Needs Assistance       Supine to sit: Supervision     General bed mobility comments: cues for sequence with use of rail and increased time  Transfers Overall transfer level: Needs assistance   Transfers: Sit to/from Stand Sit to Stand: Min assist         General transfer comment: min assist to stand from bed and BSC with cues for hand placement, safety and sequence pt impulsive with transfer to [facility] and not controlling descent with assist to prevent fall.   Ambulation/Gait Ambulation/Gait assistance: Min assist Ambulation Distance (Feet): 12 Feet Assistive device: Rolling walker (2 wheeled) Gait Pattern/deviations: Step-through pattern;Trunk flexed;Decreased stride length   Gait velocity interpretation: Below normal speed for age/gender General Gait Details: cues for posture, position in Rw and safety with pt walking 12' then 6' after seated rest. pt  impulsive with quick descent to chair unsafely. Sats 93-97% throughout mobility on 6L with sats 94% on 4L at rest. Pt declined further gait   Stairs            Wheelchair Mobility    Modified Rankin (Stroke Patients Only)       Balance Overall balance assessment: Needs assistance   Sitting balance-Leahy Scale: Good       Standing balance-Leahy Scale: Poor                      Cognition Arousal/Alertness: Awake/alert Behavior During Therapy: Anxious Overall Cognitive Status: No family/caregiver present to determine baseline cognitive functioning       Memory: Decreased short-term memory              Exercises      General Comments        Pertinent Vitals/Pain Pain Assessment: 0-10 Pain Score: 9  Pain Location: back Pain Descriptors / Indicators: Aching Pain Intervention(s): Premedicated before session;Repositioned;Patient requesting pain meds-RN notified  HR 84-95 sats 94% 4L    Home Living                      Prior Function            PT Goals (current goals can now be found in the care plan section) Progress towards PT goals: Progressing toward goals (very slowly)    Frequency       PT Plan Discharge plan needs to be updated  Co-evaluation             End of Session Equipment Utilized During Treatment: Gait belt;Oxygen Activity Tolerance: Patient limited by fatigue Patient left: in chair;with call bell/phone within reach;with chair alarm set     Time: 1129-1200 PT Time Calculation (min) (ACUTE ONLY): 31 min  Charges:  $Gait Training: 8-22 mins $Therapeutic Activity: 8-22 mins                    G Codes:      Delorse Lekabor, Caylah Plouff Beth 05/13/2015, 12:24 PM Delaney MeigsMaija Tabor Ramla Hase, PT [phone]

## 2015-05-13 NOTE — Progress Notes (Addendum)
PULMONARY / CRITICAL CARE MEDICINE   Name: [name] MRN: [id number] DOB: October 26, 1951    ADMISSION DATE:  05/04/2015 CONSULTATION DATE:  5/23  REFERRING MD :  Janee Morn   CHIEF COMPLAINT:   Acute respiratory failure   INITIAL PRESENTATION:  63 yr male w/ chronic respiratory failure (on 3 liters at home) f/b [place] w/ COPD, also has h/o CAD and gd I diastolic dysfxn. Initially admitted 5/15 w/ working dx of AECOPD +/- decompensated diastolic heart failure and volume overload. Had been making slow improvements until the am of 5/23 when he developed acute respiratory distress and progressive hypoxia and hypercarbia. CXR w/ new RUL airspace disease. PCCM asked to assist w/ care.    STUDIES:  ECHO 5/18: EF 60-65%; Doppler parameters are consistent with abnormal left ventricularrelaxation (grade 1 diastolic dysfunction).  SIGNIFICANT EVENTS: Transient BIPAP 5/23 AM   SUBJECTIVE: Feeling much better today, breathing improved  VITAL SIGNS: Temp:  [97.3 F (36.3 C)-98.2 F (36.8 C)] 98.2 F (36.8 C) (05/24 0800) Pulse Rate:  [91-111] 97 (05/24 0600) Resp:  [12-28] 14 (05/24 0600) BP: (86-162)/(57-89) 117/67 mmHg (05/24 0600) SpO2:  [83 %-100 %] 96 % (05/24 0830) FiO2 (%):  [30 %-50 %] 30 % (05/23 1335) Weight:  [84.5 kg (186 lb 4.6 oz)] 84.5 kg (186 lb 4.6 oz) (05/24 0458) HEMODYNAMICS:   VENTILATOR SETTINGS: Vent Mode:  [-]  FiO2 (%):  [30 %-50 %] 30 % INTAKE / OUTPUT:  Intake/Output Summary (Last 24 hours) at 05/13/15 0907 Last data filed at 05/13/15 0600  Gross per 24 hour  Intake   1650 ml  Output   1750 ml  Net   -100 ml    PHYSICAL EXAMINATION: General:  Chronically ill appearing, awake alert HENT: NCAT OP clear Eyes: EOMi, sclera anicteric CV: RRR, no mgr PULM: normal air movement, no wheezign, RUL rhonchi noted GI: BS+, soft, nontender Derm: chronic edema noted, bruising extensor surfaces, thin skin Neuro: Awake, alert, oriented x4,  maew  LABS:  CBC  Recent Labs Lab 05/11/15 0600 05/12/15 0315 05/13/15 0528  WBC 5.0 6.2 5.2  HGB 8.7* 9.4* 7.7*  HCT 29.3* 31.7* 25.1*  PLT 198 224 188   Coag's No results for input(s): APTT, INR in the last 168 hours. BMET  Recent Labs Lab 05/11/15 0600 05/12/15 0315 05/13/15 0528  NA 138 136 136  K 3.3* 3.7 3.8  CL 89* 86* 88*  CO2 42* 38* 37*  BUN CREATININE 0.45* 0.54* 0.42*  GLUCOSE 144* 81 200*   Electrolytes  Recent Labs Lab 05/11/15 0600 05/12/15 0315 05/13/15 0528  CALCIUM 8.7* 8.5* 8.3*  MG  --   --  2.2   Sepsis Markers  Recent Labs Lab 05/12/15 1003  LATICACIDVEN 0.9   ABG  Recent Labs Lab 05/12/15 0303 05/12/15 0906 05/12/15 1138  PHART 7.429 7.359 7.401  PCO2ART 64.2* 74.2* 68.5*  PO2ART 109* 117* 58.0*   Liver Enzymes  Recent Labs Lab 05/10/15 0349  AST 26  ALT 79*  ALKPHOS 143*  BILITOT 0.3  ALBUMIN 2.2*   Cardiac Enzymes  Recent Labs Lab 05/12/15 1025 05/12/15 1625 05/12/15 2225  TROPONINI 0.03 <0.03 <0.03   Glucose  Recent Labs Lab 05/12/15 0144 05/12/15 0557 05/12/15 1146 05/12/15 1731 05/12/15 2216 05/13/15 0808  GLUCAP 83 93 158* 162* 172* 261*    Imaging 5/23 CXR images personally reviewed> new RUL infiltrate  ASSESSMENT / PLAN:  PULMONARY OETT A: Acute on Chronic Respiratory failure in  the setting of RUL HCAP complicated by upper airway wheezing from acid reflux/upper airway irritation and encephalopathy from narcotics Recent AE COPD> improving Severe COPD baseline, chronic prednisone use 5/24 All improved P:   continue O2 as needed Albuterol prn Continue pulmicort, add brovana bid> [facility] BID PPI> change to oral and maintain on BID dosing for 2-3 weeks after discharge Tessalon prn Repeat CXR in AM  INFECTIOUS A:  HCAP P:   BCx2 5/23>>> maxipime 5/23>>> vanc 5/23>>> would stop tomorrow if afebrile and stable  NEUROLOGIC A:    Acute encephalopathy due primarily to narcotic over use complicated by hypercarbia; improved 5/24 after stopping dilaudid, percocet, oxycodone, and tramadol P:   Cautiously add back oral narcotic, would only use one (oxycodone) rather than multiple narcotic agents   OK from my standpoint to transfer to tele; management discussed with Dr. Janee Mornhompson PCCM will see again tomorrow   Heber CarolinaBrent McQuaid, MD [place] [facility] Pager: [phone] Cell: [phone] If no response, call [phone]

## 2015-05-13 NOTE — Progress Notes (Addendum)
[facility] PROGRESS NOTE  [name] MWU:[id number] DOB: 02/02/1951 DOA: 05/04/2015 PCP: Jeanann LewandowskyJEGEDE, OLUGBEMIGA, MD  Brief interval history Patient is a 64 year old gentleman with history of chronic respiratory failure on home O2 at 3 L nasal cannula, chronic pain on chronic pain medications history of coronary artery disease with grade 1 diastolic dysfunction was initially admitted on 05/04/2015 with acute on chronic respiratory failure felt to be multifactorial secondary to acute COPD exacerbation and decompensated diastolic heart failure. Patient was slowly improving however on the morning of 05/12/2015 patient deteriorated and went into an acute respiratory failure with hypercarbia and was hypoxic. Patient was subsequently transferred to the stepdown unit/ICU and placed on BiPAP. Chest x-ray obtained showed a new right upper lobe pneumonia. Patient is on about a coverage was broadened for HCAP. Patient also noted to have received multiple narcotics including Dilantin, Percocet, oxycodone, tramadol which may have decrease his respiratory drive leading to his respiratory distress. Narcotics were discontinued. Patient improved clinically. Patient subsequently transferred back to the telemetry floor. Will be cultures with resuming his narcotic medications and resume them 1 at a time. Patient currently stable.     Assessment/Plan: #1 acute on chronic respiratory failure with hypoxia Patient with worsening respiratory status with thoracoabdominal breathing and significant hypoxia requiring Ventimask and now to be placed on the BiPAP yesterday. Patient with clinical improvement. Felt in part to be narcotic mediated in addition to pneumonia noted on chest x-ray and COPD exacerbation. Chest x-ray showed a new right upper lobe pneumonia. Patient's worsening worsening respiratory status likely multifactorial secondary to new right upper lobe pneumonia in the setting of a COPD exacerbation and acute on  chronic diastolic heart failure. Patient is diuresing well. Urine Legionella antigen pending. Urine strep pneumococcus antigen negative. Continue IV vancomycin and IV cefepime, Solu-Medrol 60 mg IV 12. Continue bronchodilators, nebulizer treatments, mucolytics. Change IV Lasix to oral Lasix. PPI. Transfer to telemetry. Be cautious with resuming pain medications. PCCM ff.   #2 HCAP Per chest x-ray. Patient went worsening respiratory status. Check a urine Legionella antigen. Check a urine pneumococcus antigen. Continue IV vancomycin and IV cefepime. Continue IV Solu-Medrol. Continue nebulizer treatments, mucolytics, antitussives.  #2 acute COPD exacerbation Patient with improvement from yesterday when he was in acute on chronic respiratory distress with increased O2 requirements. Chest x-ray with a new right upper lobe pneumonia. Patient with upper airway noise and currently receiving nebulizer treatments. Poor to fair airway movement. Continue empiric IV vancomycin and IV cefepime in light of new right upper lobe pneumonia noted on chest x-ray. Continue bronchodilators, mucolytics, antitussives. PPI. Continue IV Solu-Medrol 60 mg 12hours. PCCM is following patient. Continue pulmonary toilet and sedation medications held per PCCM.  #3 acute on chronic diastolic CHF Some clinical improvement. 2-D echo with EF of 60-65% with no wall motion abnormalities. Grade 1 diastolic dysfunction. I/O equal -3.77 L/during this hospitalization. Continue aspirin, lisinopril. Change Lasix 40mg  daily hgome dose. Resume beta blocker secondary to tachycardia.  Follow.  #4 V. tach overnight/tachycardia May be secondary to acute issues versus rebound tachycardia secondary to not having his beta blocker. Will resume beta blocker monitor closely.  #5 chest pain Likely secondary to cough or respiratory distress. Patient history of coronary artery disease. Cardiac enzymes negative 3. 2-D echo with EF of 60-65% with no wall  motion abnormalities. Patient currently asymptomatic. Continue current regimen.  #6 diabetes mellitus type 2 Last hemoglobin A1c was 7.4. Oral hypoglycemic agents on hold. CBG 172-261. Increase Lantus 10 units daily, and  titrate with steriod taper.  Continue resistant sliding scale insulin. Follow.  #7 chronic pain Patient's respiratory symptoms may have been narcotic induced in part. Patient's narcotic medications have been held with significant clinical improvement. Patient on fentanyl prn. When resuming pain medications, resume slowly and cautiously.  #8 weakness PT following.  #9 prophylaxis Lovenox for DVT prophylaxis.  Code Status: Full Family Communication: Updated patient no family at bedside. Disposition Plan: Transfer to telemetry   Consultants:  PCCM: Dr Kendrick Fries 05/12/15  Procedures:  Chest x-ray 05/04/2015, 05/12/2015  2-D echo 05/07/2015    Antibiotics:  IV azithromycin 05/06/2015>>>>05/08/15  IV Rocephin 05/06/2015>>>>>05/08/15  Oral levaquin 05/08/15>>>>> 05/12/2015  IV vancomycin 05/12/2015  IV cefepime 05/12/2015  HPI/Subjective: Patient states feeling better. Patient noted to go into vtach overnight per nursing briefly, patient asymprtomatic and resolved on awakening with coughing. Patient now on [place]. Patient also noted to have candy at bedside.  Objective: Filed Vitals:   05/13/15 0800  BP:   Pulse:   Temp: 98.2 F (36.8 C)  Resp:     Intake/Output Summary (Last 24 hours) at 05/13/15 0842 Last data filed at 05/13/15 0600  Gross per 24 hour  Intake   1650 ml  Output   1750 ml  Net   -100 ml   Filed Weights   05/11/15 0412 05/12/15 0522 05/13/15 0458  Weight: 84.8 kg (186 lb 15.2 oz) 83.992 kg (185 lb 2.7 oz) 84.5 kg (186 lb 4.6 oz)    Exam:   General:  Patient on breathing treatment.   Cardiovascular: Tachycardic  Respiratory: Min-mild expiratory wheezes. Coarse breath sounds R > L.. Bibasilar crackles  Abdomen: Soft,  nontender, nondistended, positive bowel sounds.  Musculoskeletal: No clubbing cyanosis. 2+ bilateral lower extremity edema improving.  Data Reviewed: Basic Metabolic Panel:  Recent Labs Lab 05/09/15 0546 05/10/15 0349 05/11/15 0600 05/12/15 0315 05/13/15 0528  NA 137 139 138 136 136  K 3.9 4.4 3.3* 3.7 3.8  CL 90* 90* 89* 86* 88*  CO2 38* 37* 42* 38* 37*  GLUCOSE 292* 220* 144* 81 200*  BUN CREATININE 0.46* 0.53* 0.45* 0.54* 0.42*  CALCIUM 8.8* 8.8* 8.7* 8.5* 8.3*  MG  --   --   --   --  2.2   Liver Function Tests:  Recent Labs Lab 05/10/15 0349  AST 26  ALT 79*  ALKPHOS 143*  BILITOT 0.3  PROT 5.9*  ALBUMIN 2.2*   No results for input(s): LIPASE, AMYLASE in the last 168 hours. No results for input(s): AMMONIA in the last 168 hours. CBC:  Recent Labs Lab 05/09/15 0546 05/10/15 0349 05/11/15 0600 05/12/15 0315 05/13/15 0528  WBC 8.4 10.1 5.0 6.2 5.2  NEUTROABS  --   --   --   --  4.9  HGB 7.4* 8.5* 8.7* 9.4* 7.7*  HCT 24.6* 28.1* 29.3* 31.7* 25.1*  MCV 91.8 93.0 92.1 91.1 90.6  PLT 192 218 198 224 188   Cardiac Enzymes:  Recent Labs Lab 05/12/15 1025 05/12/15 1625 05/12/15 2225  TROPONINI 0.03 <0.03 <0.03   BNP (last 3 results)  Recent Labs  05/04/15 2318  BNP 136.0*    ProBNP (last 3 results)  Recent Labs  10/27/14 2311  PROBNP 89.0    CBG:  Recent Labs Lab 05/12/15 0557 05/12/15 1146 05/12/15 1731 05/12/15 2216 05/13/15 0808  GLUCAP 93 158* 162* 172* 261*    Recent Results (from the past 240 hour(s))  Culture, blood (routine x 2) Call  MD if unable to obtain prior to antibiotics being given     Status: None (Preliminary result)   Collection Time: 05/12/15  7:04 AM  Result Value Ref Range Status   Specimen Description BLOOD RIGHT ARM  Final   Special Requests BOTTLES DRAWN AEROBIC ONLY 5CC  Final   Culture   Final           BLOOD CULTURE RECEIVED NO GROWTH TO DATE CULTURE WILL BE HELD FOR 5 DAYS BEFORE  ISSUING A FINAL NEGATIVE REPORT Performed at [facility]    Report Status PENDING  Incomplete  Culture, blood (routine x 2) Call MD if unable to obtain prior to antibiotics being given     Status: None (Preliminary result)   Collection Time: 05/12/15  7:14 AM  Result Value Ref Range Status   Specimen Description BLOOD RIGHT HAND  Final   Special Requests BOTTLES DRAWN AEROBIC ONLY 1CC  Final   Culture   Final           BLOOD CULTURE RECEIVED NO GROWTH TO DATE CULTURE WILL BE HELD FOR 5 DAYS BEFORE ISSUING A FINAL NEGATIVE REPORT Performed at [facility]    Report Status PENDING  Incomplete  MRSA PCR Screening     Status: Abnormal   Collection Time: 05/12/15  2:10 PM  Result Value Ref Range Status   MRSA by PCR POSITIVE (A) NEGATIVE Final    Comment:        The GeneXpert MRSA Assay (FDA approved for NASAL specimens only), is one component of a comprehensive MRSA colonization surveillance program. It is not intended to diagnose MRSA infection nor to guide or monitor treatment for MRSA infections. RESULT CALLED TO, READ BACK BY AND VERIFIED WITH: Liz Beach RN 15:55 05/12/15 ([email or web address])      Studies: Dg Chest Port 1 View  05/12/2015   CLINICAL DATA:  Dyspnea  EXAM: PORTABLE CHEST - 1 VIEW  COMPARISON:  05/04/2015  FINDINGS: New ill-defined airspace disease in the right upper lung.  Background of hyperinflation and emphysematous change. No edema, effusion, or pneumothorax.  Normal heart size and mediastinal contours.  IMPRESSION: 1. Right upper lobe pneumonia, new from 05/04/2015. 2. Emphysema.   Electronically Signed   By: Marnee Spring M.D.   On: 05/12/2015 03:14    Scheduled Meds: . albuterol  2.5 mg Nebulization Q4H  . antiseptic oral rinse  7 mL Mouth Rinse q12n4p  . antiseptic oral rinse  7 mL Mouth Rinse BID  . aspirin  325 mg Oral q morning - 10a  . budesonide (PULMICORT) nebulizer solution  0.5 mg Nebulization Q12H  . ceFEPime (MAXIPIME) IV  1 g  Intravenous 3 times per day  . chlorhexidine  15 mL Mouth Rinse BID  . Chlorhexidine Gluconate Cloth  6 each Topical Q0600  . clopidogrel  75 mg Oral Daily  . enoxaparin (LOVENOX) injection  40 mg Subcutaneous Q24H  . guaiFENesin  1,200 mg Oral BID  . insulin aspart  0-20 Units Subcutaneous TID WC  . insulin glargine  5 Units Subcutaneous Daily  . methylPREDNISolone (SOLU-MEDROL) injection  60 mg Intravenous Q12H  . mupirocin ointment  1 application Nasal BID  . nitroGLYCERIN  0.5 inch Topical 4 times per day  . pantoprazole (PROTONIX) IV  40 mg Intravenous Q12H  . potassium chloride SA  40 mEq Oral Daily  . sodium chloride  750 mL Intravenous Once  . sodium chloride  3 mL Intravenous Q12H  . vancomycin  1,000 mg Intravenous 3 times per day   Continuous Infusions: . sodium chloride 10 mL/hr at 05/12/15 2000  . sodium chloride 10 mL/hr at 05/12/15 2000    Principal Problem:   Acute on chronic respiratory failure Active Problems:   HCAP (healthcare-associated pneumonia)   Essential hypertension   Coronary atherosclerosis   Acute on chronic respiratory failure with hypoxia   Bilateral lower extremity edema   COPD exacerbation   Diabetes   Acute on chronic diastolic CHF (congestive heart failure)   Weakness of both legs   History of CHF (congestive heart failure)   Other chest pain   DM (diabetes mellitus) type II uncontrolled, periph vascular disorder    Time spent: 35 minutes    Haillie Radu M.D. [facility] Pager [phone]. If 7PM-7AM, please contact night-coverage at [email or web address], password [facility] 05/13/2015, 8:42 AM  LOS: 8 days

## 2015-05-14 ENCOUNTER — Inpatient Hospital Stay (HOSPITAL_COMMUNITY): Payer: Medicare Other

## 2015-05-14 LAB — GLUCOSE, CAPILLARY
GLUCOSE-CAPILLARY: 222 mg/dL — AB (ref 65–99)
GLUCOSE-CAPILLARY: 150 mg/dL — AB (ref 65–99)
Glucose-Capillary: 117 mg/dL — ABNORMAL HIGH (ref 65–99)
Glucose-Capillary: 223 mg/dL — ABNORMAL HIGH (ref 65–99)
Glucose-Capillary: 197 mg/dL — ABNORMAL HIGH (ref 65–99)

## 2015-05-14 LAB — BASIC METABOLIC PANEL
ANION GAP: 10 (ref 5–15)
BUN: 13 mg/dL (ref 6–20)
CO2: 36 mmol/L — ABNORMAL HIGH (ref 22–32)
CREATININE: 0.44 mg/dL — AB (ref 0.61–1.24)
Calcium: 8.1 mg/dL — ABNORMAL LOW (ref 8.9–10.3)
Chloride: 89 mmol/L — ABNORMAL LOW (ref 101–111)
GFR calc Af Amer: >60 mL/min (ref >60)
GFR calc non Af Amer: >60 mL/min (ref >60)
GLUCOSE: 250 mg/dL — AB (ref 65–99)
Potassium: 4.1 mmol/L (ref 3.5–5.1)
Sodium: 135 mmol/L (ref 135–145)

## 2015-05-14 LAB — CBC
HCT: 25.5 % — ABNORMAL LOW (ref 39.0–52.0)
Hemoglobin: 7.8 g/dL — ABNORMAL LOW (ref 13.0–17.0)
MCH: 27.7 pg (ref 26.0–34.0)
MCHC: 30.6 g/dL (ref 30.0–36.0)
MCV: 90.4 fL (ref 78.0–100.0)
PLATELETS: 184 10*3/uL (ref 150–400)
RBC: 2.82 MIL/uL — ABNORMAL LOW (ref 4.22–5.81)
RDW: 16.7 % — AB (ref 11.5–15.5)
WBC: 6.4 10*3/uL (ref 4.0–10.5)

## 2015-05-14 LAB — MAGNESIUM: Magnesium: 2.2 mg/dL (ref 1.7–2.4)

## 2015-05-14 LAB — VANCOMYCIN, TROUGH: Vancomycin Tr: 24 ug/mL — ABNORMAL HIGH (ref 10.0–20.0)

## 2015-05-14 MED ORDER — ZOLPIDEM TARTRATE 5 MG PO TABS
5.0000 mg | ORAL_TABLET | Freq: Every evening | ORAL | Status: DC | PRN
  Administered 2015-05-14 – 2015-05-16 (×3): 5 mg via ORAL
  Filled 2015-05-14 (×3): qty 1

## 2015-05-14 MED ORDER — VANCOMYCIN HCL 10 G IV SOLR
1250.0000 mg | Freq: Two times a day (BID) | INTRAVENOUS | Status: DC
  Administered 2015-05-15 – 2015-05-17 (×5): 1250 mg via INTRAVENOUS
  Filled 2015-05-14 (×7): qty 1250

## 2015-05-14 MED ORDER — FENTANYL CITRATE (PF) 100 MCG/2ML IJ SOLN
25.0000 ug | INTRAMUSCULAR | Status: DC | PRN
  Administered 2015-05-15 – 2015-05-16 (×3): 25 ug via INTRAVENOUS
  Filled 2015-05-14 (×3): qty 2

## 2015-05-14 MED ORDER — DIPHENHYDRAMINE HCL 25 MG PO CAPS: 25.0000 mg | ORAL_CAPSULE | Freq: Every day | ORAL | Status: DC

## 2015-05-14 MED ORDER — KETOROLAC TROMETHAMINE 15 MG/ML IJ SOLN
15.0000 mg | Freq: Four times a day (QID) | INTRAMUSCULAR | Status: DC | PRN
  Administered 2015-05-14 – 2015-05-17 (×7): 15 mg via INTRAVENOUS
  Filled 2015-05-14 (×7): qty 1

## 2015-05-14 MED ORDER — ALUM & MAG HYDROXIDE-SIMETH 200-200-20 MG/5ML PO SUSP
30.0000 mL | ORAL | Status: DC | PRN
  Administered 2015-05-14 – 2015-05-18 (×2): 30 mL via ORAL
  Filled 2015-05-14 (×2): qty 30

## 2015-05-14 MED ORDER — PREDNISONE 20 MG PO TABS
60.0000 mg | ORAL_TABLET | Freq: Every day | ORAL | Status: DC
  Administered 2015-05-15 – 2015-05-16 (×2): 60 mg via ORAL
  Filled 2015-05-14 (×2): qty 3

## 2015-05-14 NOTE — Clinical Social Work Note (Signed)
Clinical Social Work Assessment  Patient Details  Name: [name] MRN: [id number] Date of Birth: May 28, 1951  Date of referral:  05/14/15               Reason for consult:  Facility Placement                Permission sought to share information with:  Family Supports Permission granted to share information::  Yes, Verbal Permission Granted  Name::     Veneta Penton  Relationship::  Sister  Contact Information:  [phone] / [phone]  Housing/Transportation Living arrangements for the past 2 months:  Single Family Home Source of Information:  Patient Patient Interpreter Needed:  None Criminal Activity/Legal Involvement Pertinent to Current Situation/Hospitalization:  No - Comment as needed Significant Relationships:  Adult Children Lives with:  Siblings Do you feel safe going back to the place where you live?  No (Physical limitations) Need for family participation in patient care:  Yes (Comment)  Care giving concerns:  No patient family at bedside, however per patient and RN, patient family has been at bedside visiting.  Patient states that his sister's concern is that she will not be able to physically manage him at home by herself.  Patient sister is agreeable to patient return home following SNF stay.   Social Worker assessment / plan:  Holiday representative met with patient at bedside to offer support and discuss patient needs at discharge.  Patient states that he has been in and out of the hospital as well as SNF's for the better part of this year.  Patient can recollect placement at [facility] in [facility].  Patient feels certain that he no longer has available Medicare days for SNF placement, however not able to confirm.  Patient agreeable to SNF search in [place] initiated search and notified of the possibility of no available Medicare days.  Patient states that he has a sister at [facility] and another sister who has previously worked at  [employer].  Patient had intentions of initiating a Medicaid application, however has not yet done so.  CSW to follow up with patient regarding available bed offers pending patient available Medicare days.  CSW to update CM during morning progression of the possibility for patient need to return home with home health.  CSW remains available for support and to assist with facilitating patient discharge needs.  Employment status:  Retired Forensic scientist:  Medicare PT Recommendations:  [place] / Referral to community resources:  [place]  Patient/Family's Response to care:  Patient verbalized understanding of CSW role and appreciation for support and concern.  Patient aware that SNF options may be limited and that home with home health may be the potential discharge plan pending available Medicare days.  Patient/Family's Understanding of and Emotional Response to Diagnosis, Current Treatment, and Prognosis:  Patient is aware of his limitations and is hopeful to gain back some of his original independence.  Patient states that his family is a good support system and fully aware of patient medical conditions.  Emotional Assessment Appearance:  Disheveled, Appears older than stated age Attitude/Demeanor/Rapport:  Guarded (Appropriate / Cooperative) Affect (typically observed):  Accepting, Appropriate, Guarded Orientation:  Oriented to Self, Oriented to Place, Oriented to  Time, Oriented to Situation Alcohol / Substance use:  Not Applicable Psych involvement (Current and /or in the community):  No (Comment)  Discharge Needs  Concerns to be addressed:  Discharge Planning  Concerns, Care Coordination Readmission within the last 30 days:  No Current discharge risk:  Dependent with Mobility, Lack of support system Barriers to Discharge:  Continued Medical Work up, Other (Potential for no Medicare days left for SNF placement)   Barbette Or, [place]

## 2015-05-14 NOTE — Care Management Note (Signed)
Case Management Note  Patient Details  Name: Chris ProphetUlysses G Brueckner MRN: [id number] Date of Birth: 02/05/1951  Subjective/Objective:  Pt continues on IV vancomycin and maxipime.                     Action/Plan: PT recommendations for SNF once stable. CSW aware of disposition needs.    Expected Discharge Date:                  Expected Discharge Plan:  Home w Home Health Services  In-House Referral:     Discharge planning Services  CM Consult  Post Acute Care Choice:  Home Health Choice offered to:  Patient  DME Arranged:    DME Agency:     HH Arranged:  RN, PT, OT, Nurse's Aide [facility]  Status of Service:  Completed, signed off  Medicare Important Message Given:  Yes Date Medicare IM Given:  05/12/15 Medicare IM give by:  debbie dowell rn,bsn Date Additional Medicare IM Given:    Additional Medicare Important Message give by:     If discussed at Long Length of Stay Meetings, dates discussed:    Additional Comments:  Gala LewandowskyGraves-Bigelow, Mikeyla Music Kaye, RN 05/14/2015, 2:30 PM

## 2015-05-14 NOTE — Progress Notes (Addendum)
[facility] Progress Note   [name]:[id number] DOB: 28-Apr-1951 DOA: 05/04/2015 PCP: Jeanann Lewandowsky, MD  Brief narrative: [name] is a 64 y.o. male with history of chronic respiratory failure on home O2 at 3 L nasal cannula, chronic pain on chronic pain medications history of coronary artery disease with grade 1 diastolic dysfunction was initially admitted on 05/04/2015 with acute on chronic respiratory failure felt to be multifactorial secondary to acute COPD exacerbation and decompensated diastolic heart failure.   He was was slowly improving however on the morning of 05/12/2015 patient deteriorated and went into an acute respiratory failure with hypercarbia and was hypoxic and was subsequently transferred to the stepdown unit/ICU and placed on BiPAP. Chest x-ray obtained showed a new right upper lobe pneumonia. Anti-biotic coverage broadened for HCAP.   Patient also noted to have received multiple narcotics including Dilantin, Percocet, oxycodone, tramadol which may have decrease his respiratory drive leading to his respiratory distress. Narcotics were discontinued.   Subjective: Patient is complaining of pain in his mid upper back and asking to increase pain medication. I have explained to him in detail the complications of receiving too much narcotics. He admits to having shortness of breath on exertion and has been using the bedside commode rather than ambulating to the bathroom. He has a mild cough. No complaints of chest pain nausea, vomiting, abdominal pain or diarrhea.  Assessment/Plan: Principal Problem:   Acute on chronic respiratory failure - Initially due to COPD exacerbation and subsequently due HCAP /possible aspiration pneumonia  Active Problems: COPD exacerbation- chronic respiratory failure secondary to severe COPD -Requires 3 L of oxygen at baseline-he is at his baseline O2 requirements at this time - Currently receiving methylprednisolone at a  dose of 40 mg every 24 hours- will wean to oral Prednisone tomorrow -He is on chronic prednisone at home which he states is 30 mg a day -Continue Pulmicort, Brovana and when necessary albuterol  HCAP -Right upper lobe infiltrate noted when patient developed acute hypoxic rest for a failure on 5/23 -I am highly suspicious that this is related to aspiration -Chest x-ray today more suspicious of a right upper lobe abscess -Continue vancomycin and cefepime-may need to consider CT of the chest to further evaluate -Pulmonary also following  Acute diastolic heart failure -Resolved -Has been transitioned to oral daily Lasix as he is now euvolemic -Continue lisinopril -2-D echo reveals a normal EF with only grade 1 diastolic dysfunction  Anemia of chronic disease -Continue to follow hemoglobin  Ventricular tachycardia -Suspected to be secondary to being off of beta blocker-it a blocker has been resumed-continue to follow or recurrence  Chronic pain -He was not being prescribed any narcotics as outpatient - Limiting narcotics at this point do to above-mentioned respiratory failure on 5/23 which was suspected to be due to excessive sedative medications -We'll start IV Toradol-will not be discharging him home or to SNF with any narcotics-he understands this  Generalized weakness - Physical therapy recommending SNF versus 24-hour supervision at home  Morbid obesity Body mass index is 26.15 kg/(m^2).   Code Status: Full code Family Communication:  Disposition Plan: SNF DVT prophylaxis:  Lovenox Consultants: Pulmonary Procedures:  2-D echo 5/18- EF 60-65%-grade 1 diastolic dysfunction  Antibiotics: Anti-infectives    Start     Dose/Rate Route Frequency Ordered Stop   05/12/15 1600  vancomycin (VANCOCIN) IVPB 1000 mg/200 mL premix     1,000 mg 200 mL/hr over 60 Minutes Intravenous 3 times per day 05/12/15 1437  05/12/15 0700  ceFEPIme (MAXIPIME) 1 g in dextrose 5 % 50 mL IVPB      1 g 100 mL/hr over 30 Minutes Intravenous 3 times per day 05/12/15 0557 05/20/15 0859   05/12/15 0700  vancomycin (VANCOCIN) IVPB 1000 mg/200 mL premix  Status:  Discontinued     1,000 mg 200 mL/hr over 60 Minutes Intravenous Every 8 hours 05/12/15 0603 05/12/15 0616   05/12/15 0630  vancomycin (VANCOCIN) IVPB 1000 mg/200 mL premix  Status:  Discontinued     1,000 mg 200 mL/hr over 60 Minutes Intravenous 3 times per day 05/12/15 0616 05/12/15 1437   05/09/15 1000  azithromycin (ZITHROMAX) tablet 500 mg  Status:  Discontinued     500 mg Oral Daily 05/08/15 1307 05/09/15 0840   05/09/15 1000  levofloxacin (LEVAQUIN) tablet 500 mg  Status:  Discontinued     500 mg Oral Daily 05/09/15 0840 05/12/15 0557   05/06/15 1300  cefTRIAXone (ROCEPHIN) 1 g in dextrose 5 % 50 mL IVPB - Premix  Status:  Discontinued     1 g 100 mL/hr over 30 Minutes Intravenous Every 24 hours 05/06/15 1204 05/09/15 0840   05/06/15 1300  azithromycin (ZITHROMAX) 500 mg in dextrose 5 % 250 mL IVPB  Status:  Discontinued     500 mg 250 mL/hr over 60 Minutes Intravenous Every 24 hours 05/06/15 1204 05/08/15 1307      Objective: Filed Weights   05/12/15 0915 05/13/15 0458 05/14/15 0400  Weight: 83.5 kg (184 lb 1.4 oz) 84.5 kg (186 lb 4.6 oz) 85 kg (187 lb 6.3 oz)    Intake/Output Summary (Last 24 hours) at 05/14/15 1131 Last data filed at 05/14/15 1100  Gross per 24 hour  Intake   1000 ml  Output   1550 ml  Net   -550 ml     Vitals Filed Vitals:   05/14/15 0430 05/14/15 0451 05/14/15 0843 05/14/15 0921  BP: 120/60  128/74   Pulse: 77  96   Temp:   97.7 F (36.5 C)   TempSrc:   Oral   Resp: 15     Height:      Weight:      SpO2: 100% 97% 99% 99%    Exam:  General:  Pt is alert, not in acute distress  HEENT: No icterus, No thrush  Cardiovascular: regular rate and rhythm, S1/S2 No murmur  Respiratory: Extremely poor air entry bilaterally-no wheezing rhonchi or crackles  Abdomen: Soft, +Bowel  sounds, non tender, non distended, no guarding  MSK: No LE edema, cyanosis or clubbing  Data Reviewed: Basic Metabolic Panel:  Recent Labs Lab 05/10/15 0349 05/11/15 0600 05/12/15 0315 05/13/15 0528 05/14/15 0555  NA 139 138 136 136 135  K 4.4 3.3* 3.7 3.8 4.1  CL 90* 89* 86* 88* 89*  CO2 37* 42* 38* 37* 36*  GLUCOSE 220* 144* 81 200* 250*  BUN CREATININE 0.53* 0.45* 0.54* 0.42* 0.44*  CALCIUM 8.8* 8.7* 8.5* 8.3* 8.1*  MG  --   --   --  2.2 2.2   Liver Function Tests:  Recent Labs Lab 05/10/15 0349  AST 26  ALT 79*  ALKPHOS 143*  BILITOT 0.3  PROT 5.9*  ALBUMIN 2.2*   No results for input(s): LIPASE, AMYLASE in the last 168 hours. No results for input(s): AMMONIA in the last 168 hours. CBC:  Recent Labs Lab 05/10/15 0349 05/11/15 0600 05/12/15 0315 05/13/15 0528 05/14/15 0555  WBC  10.1 5.0 6.2 5.2 6.4  NEUTROABS  --   --   --  4.9  --   HGB 8.5* 8.7* 9.4* 7.7* 7.8*  HCT 28.1* 29.3* 31.7* 25.1* 25.5*  MCV 93.0 92.1 91.1 90.6 90.4  PLT 218 198 224 188 184   Cardiac Enzymes:  Recent Labs Lab 05/12/15 1025 05/12/15 1625 05/12/15 2225  TROPONINI 0.03 <0.03 <0.03   BNP (last 3 results)  Recent Labs  05/04/15 2318  BNP 136.0*    ProBNP (last 3 results)  Recent Labs  10/27/14 2311  PROBNP 89.0    CBG:  Recent Labs Lab 05/13/15 1240 05/13/15 1419 05/13/15 1638 05/13/15 2121 05/14/15 0744  GLUCAP 117* 112* 128* 108* 223*    Recent Results (from the past 240 hour(s))  Culture, blood (routine x 2) Call MD if unable to obtain prior to antibiotics being given     Status: None (Preliminary result)   Collection Time: 05/12/15  7:04 AM  Result Value Ref Range Status   Specimen Description BLOOD RIGHT ARM  Final   Special Requests BOTTLES DRAWN AEROBIC ONLY 5CC  Final   Culture   Final           BLOOD CULTURE RECEIVED NO GROWTH TO DATE CULTURE WILL BE HELD FOR 5 DAYS BEFORE ISSUING A FINAL NEGATIVE REPORT Performed at  [facility]    Report Status PENDING  Incomplete  Culture, blood (routine x 2) Call MD if unable to obtain prior to antibiotics being given     Status: None (Preliminary result)   Collection Time: 05/12/15  7:14 AM  Result Value Ref Range Status   Specimen Description BLOOD RIGHT HAND  Final   Special Requests BOTTLES DRAWN AEROBIC ONLY 1CC  Final   Culture   Final           BLOOD CULTURE RECEIVED NO GROWTH TO DATE CULTURE WILL BE HELD FOR 5 DAYS BEFORE ISSUING A FINAL NEGATIVE REPORT Performed at [facility]    Report Status PENDING  Incomplete  MRSA PCR Screening     Status: Abnormal   Collection Time: 05/12/15  2:10 PM  Result Value Ref Range Status   MRSA by PCR POSITIVE (A) NEGATIVE Final    Comment:        The GeneXpert MRSA Assay (FDA approved for NASAL specimens only), is one component of a comprehensive MRSA colonization surveillance program. It is not intended to diagnose MRSA infection nor to guide or monitor treatment for MRSA infections. RESULT CALLED TO, READ BACK BY AND VERIFIED WITH: Liz Beach RN 15:55 05/12/15 ([email or web address])      Studies: Dg Chest 2 View  05/14/2015   CLINICAL DATA:  Chest pain and cough.  EXAM: CHEST  2 VIEW  COMPARISON:  05/12/2015.  05/04/2015.  CT 03/20/2015 .  FINDINGS: Left PICC line stable position. Mediastinum hilar structures are normal. Cavitary lesion containing a small amount of fluid is noted in the right upper lobe. This could represent fluid within a bullae secondary to infection. Developing abscess cannot be excluded. No pleural effusion or pneumothorax. Stable cardiomegaly with normal pulmonary vascularity. Diffuse multilevel degenerative change with multiple compression fractures again noted.  IMPRESSION: 1.  Left PICC line stable position.  2. Right upper lobe bullae containing fluid, possibly secondary to infection. Developing pulmonary abscess cannot be excluded .   Electronically Signed   By: Maisie Fus  Register    On: 05/14/2015 08:24    Scheduled Meds:  Scheduled Meds: . albuterol  2.5 mg Nebulization Q4H  . antiseptic oral rinse  7 mL Mouth Rinse BID  . arformoterol  15 mcg Nebulization BID  . aspirin  325 mg Oral q morning - 10a  . benzonatate  200 mg Oral TID  . budesonide (PULMICORT) nebulizer solution  0.5 mg Nebulization Q12H  . ceFEPime (MAXIPIME) IV  1 g Intravenous 3 times per day  . Chlorhexidine Gluconate Cloth  6 each Topical Q0600  . clopidogrel  75 mg Oral Daily  . enoxaparin (LOVENOX) injection  40 mg Subcutaneous Q24H  . furosemide  40 mg Oral Daily  . guaiFENesin  1,200 mg Oral BID  . insulin aspart  0-20 Units Subcutaneous TID WC  . insulin glargine  10 Units Subcutaneous Daily  . methylPREDNISolone (SOLU-MEDROL) injection  40 mg Intravenous Q24H  . metoprolol  50 mg Oral BID  . mupirocin ointment  1 application Nasal BID  . nitroGLYCERIN  0.5 inch Topical 4 times per day  . nystatin  5 mL Oral QID  . pantoprazole  40 mg Oral BID  . potassium chloride SA  40 mEq Oral Daily  . sodium chloride  750 mL Intravenous Once  . sodium chloride  3 mL Intravenous Q12H  . vancomycin  1,000 mg Intravenous 3 times per day   Continuous Infusions: . sodium chloride Stopped (05/13/15 1200)  . sodium chloride Stopped (05/13/15 1200)    Time spent on care of this patient: 35 minutes   Kyandre Okray, MD 05/14/2015, 11:31 AM  LOS: 9 days   [facility] Office  [phone] Pager - Text Page per [email or web address]  If 7PM-7AM, please contact night-coverage [email or web address]

## 2015-05-14 NOTE — Clinical Social Work Placement (Signed)
   CLINICAL SOCIAL WORK PLACEMENT  NOTE  Date:  05/14/2015  Patient Details  Name: [name] MRN: [id number] Date of Birth: 09/13/1951  Clinical Social Work is seeking post-discharge placement for this patient at the Skilled  Nursing Facility level of care (*CSW will initial, date and re-position this form in  chart as items are completed):  Yes   Patient/family provided with [facility] Work Department's list of facilities offering this level of care within the geographic area requested by the patient (or if unable, by the patient's family).  Yes   Patient/family informed of their freedom to choose among providers that offer the needed level of care, that participate in Medicare, Medicaid or managed care program needed by the patient, have an available bed and are willing to accept the patient.  Yes   Patient/family informed of [facility]'s ownership interest in [facility] and [facility], as well as of the fact that they are under no obligation to receive care at these facilities.  PASRR submitted to EDS on       PASRR number received on       Existing PASRR number confirmed on 04/01/14     FL2 transmitted to all facilities in geographic area requested by pt/family on 05/14/15     FL2 transmitted to all facilities within larger geographic area on       Patient informed that his/her managed care company has contracts with or will negotiate with certain facilities, including the following:            Patient/family informed of bed offers received.  Patient chooses bed at       Physician recommends and patient chooses bed at      Patient to be transferred to   on  .  Patient to be transferred to facility by       Patient family notified on   of transfer.  Name of family member notified:        PHYSICIAN       Additional Comment:    Macario GoldsJesse Chistine Dematteo, LCSW [phone]

## 2015-05-14 NOTE — Progress Notes (Signed)
ANTIBIOTIC CONSULT NOTE - FOLLOW UP  Pharmacy Consult for Vancomycin Indication: HCAp  Allergies  Allergen Reactions  . Lipitor [Atorvastatin Calcium] Other (See Comments)    Muscle aches    Patient Measurements: Height: 5\' 11"  (180.3 cm) Weight: 187 lb 6.3 oz (85 kg) IBW/kg (Calculated) : 75.3  Vital Signs: Temp: 97.7 F (36.5 C) (05/25 0843) Temp Source: Oral (05/25 0843) BP: 128/74 mmHg (05/25 0843) Pulse Rate: 96 (05/25 0843) Intake/Output from previous day: 05/24 0701 - 05/25 0700 In: 2027 [P.O.:1200; I.V.:77; IV Piggyback:750] Out: 1500 [Urine:1500] Intake/Output from this shift: Total I/O In: -  Out: 525 [Urine:525]  Labs:  Recent Labs  05/12/15 0315 05/13/15 0528 05/14/15 0555  WBC 6.2 5.2 6.4  HGB 9.4* 7.7* 7.8*  PLT 224 188 184  CREATININE 0.54* 0.42* 0.44*   Estimated Creatinine Clearance: 98 mL/min (by C-G formula based on Cr of 0.44).  Recent Labs  05/14/15 1500  VANCOTROUGH 24*     Microbiology: Recent Results (from the past 720 hour(s))  Culture, blood (routine x 2) Call MD if unable to obtain prior to antibiotics being given     Status: None (Preliminary result)   Collection Time: 05/12/15  7:04 AM  Result Value Ref Range Status   Specimen Description BLOOD RIGHT ARM  Final   Special Requests BOTTLES DRAWN AEROBIC ONLY 5CC  Final   Culture   Final           BLOOD CULTURE RECEIVED NO GROWTH TO DATE CULTURE WILL BE HELD FOR 5 DAYS BEFORE ISSUING A FINAL NEGATIVE REPORT [facility]formed at [facility]    Report Status PENDING  Incomplete  Culture, blood (routine x 2) Call MD if unable to obtain prior to antibiotics being given     Status: None (Preliminary result)   Collection Time: 05/12/15  7:14 AM  Result Value Ref Range Status   Specimen Description BLOOD RIGHT HAND  Final   Special Requests BOTTLES DRAWN AEROBIC ONLY 1CC  Final   Culture   Final           BLOOD CULTURE RECEIVED NO GROWTH TO DATE CULTURE WILL BE HELD FOR 5 DAYS  BEFORE ISSUING A FINAL NEGATIVE REPORT Performed at [facility]    Report Status PENDING  Incomplete  MRSA PCR Screening     Status: Abnormal   Collection Time: 05/12/15  2:10 PM  Result Value Ref Range Status   MRSA by PCR POSITIVE (A) NEGATIVE Final    Comment:        The GeneXpert MRSA Assay (FDA approved for NASAL specimens only), is one component of a comprehensive MRSA colonization surveillance program. It is not intended to diagnose MRSA infection nor to guide or monitor treatment for MRSA infections. RESULT CALLED TO, READ BACK BY AND VERIFIED WITH: Liz BeachP. STRAMOSKI RN 15:55 05/12/15 ([email or web address])     Anti-infectives    Start     Dose/Rate Route Frequency Ordered Stop   05/12/15 1600  vancomycin (VANCOCIN) IVPB 1000 mg/200 mL premix     1,000 mg 200 mL/hr over 60 Minutes Intravenous 3 times per day 05/12/15 1437     05/12/15 0700  ceFEPIme (MAXIPIME) 1 g in dextrose 5 % 50 mL IVPB     1 g 100 mL/hr over 30 Minutes Intravenous 3 times per day 05/12/15 0557 05/20/15 0859   05/12/15 0700  vancomycin (VANCOCIN) IVPB 1000 mg/200 mL premix  Status:  Discontinued     1,000 mg 200 mL/hr over 60  Minutes Intravenous Every 8 hours 05/12/15 0603 05/12/15 0616   05/12/15 0630  vancomycin (VANCOCIN) IVPB 1000 mg/200 mL premix  Status:  Discontinued     1,000 mg 200 mL/hr over 60 Minutes Intravenous 3 times per day 05/12/15 0616 05/12/15 1437   05/09/15 1000  azithromycin (ZITHROMAX) tablet 500 mg  Status:  Discontinued     500 mg Oral Daily 05/08/15 1307 05/09/15 0840   05/09/15 1000  levofloxacin (LEVAQUIN) tablet 500 mg  Status:  Discontinued     500 mg Oral Daily 05/09/15 0840 05/12/15 0557   05/06/15 1300  cefTRIAXone (ROCEPHIN) 1 g in dextrose 5 % 50 mL IVPB - Premix  Status:  Discontinued     1 g 100 mL/hr over 30 Minutes Intravenous Every 24 hours 05/06/15 1204 05/09/15 0840   05/06/15 1300  azithromycin (ZITHROMAX) 500 mg in dextrose 5 % 250 mL IVPB  Status:   Discontinued     500 mg 250 mL/hr over 60 Minutes Intravenous Every 24 hours 05/06/15 1204 05/08/15 1307      Assessment: 63 YOM on vancomycin and cefepime for HCAP. His renal function has been stable, with est. crcl ~ 90-100 ml/min. Vancomycin trough is slightly above goal at 24. Level was drawn appropriately. RN gave 1600 dose already.   IV azithromycin 05/06/2015>>>>05/08/15 IV Rocephin 05/06/2015>>>>>05/08/15 Oral levaquin 05/08/15>>>>> 05/12/2015 IV vancomycin 05/12/2015>> IV cefepime 05/12/2015>>  5/23 Bld Cx2>>NGTD   Goal of Therapy:  Vancomycin trough level 15-20 mcg/ml  Plan:  - Change vancomycin to 1250 mg IV Q 12 hrs, next dose tomorrow at 0800 - Monitor renal function and f/u cultures. - Recheck trough at new steady state if continues. - Continue cefepime 1g IV Q 8 hrs  Bayard Hugger, PharmD, BCPS  Clinical Pharmacist  Pager: [phone]   05/14/2015,5:26 PM

## 2015-05-15 ENCOUNTER — Inpatient Hospital Stay (HOSPITAL_COMMUNITY): Payer: Medicare Other

## 2015-05-15 ENCOUNTER — Encounter (HOSPITAL_COMMUNITY): Payer: Self-pay | Admitting: Radiology

## 2015-05-15 DIAGNOSIS — J9620 Acute and chronic respiratory failure, unspecified whether with hypoxia or hypercapnia: Secondary | ICD-10-CM

## 2015-05-15 DIAGNOSIS — J850 Gangrene and necrosis of lung: Secondary | ICD-10-CM | POA: Insufficient documentation

## 2015-05-15 DIAGNOSIS — R0902 Hypoxemia: Secondary | ICD-10-CM | POA: Insufficient documentation

## 2015-05-15 DIAGNOSIS — J852 Abscess of lung without pneumonia: Secondary | ICD-10-CM

## 2015-05-15 DIAGNOSIS — L8990 Pressure ulcer of unspecified site, unspecified stage: Secondary | ICD-10-CM | POA: Insufficient documentation

## 2015-05-15 LAB — CBC
HCT: 25.9 % — ABNORMAL LOW (ref 39.0–52.0)
HEMATOCRIT: 24.9 % — AB (ref 39.0–52.0)
Hemoglobin: 7.6 g/dL — ABNORMAL LOW (ref 13.0–17.0)
Hemoglobin: 8.1 g/dL — ABNORMAL LOW (ref 13.0–17.0)
MCH: 27.8 pg (ref 26.0–34.0)
MCH: 27.6 pg (ref 26.0–34.0)
MCHC: 30.5 g/dL (ref 30.0–36.0)
MCHC: 31.3 g/dL (ref 30.0–36.0)
MCV: 91.2 fL (ref 78.0–100.0)
MCV: 88.4 fL (ref 78.0–100.0)
Platelets: 166 10*3/uL (ref 150–400)
Platelets: 161 10*3/uL (ref 150–400)
RBC: 2.73 MIL/uL — AB (ref 4.22–5.81)
RBC: 2.93 MIL/uL — ABNORMAL LOW (ref 4.22–5.81)
RDW: 17 % — AB (ref 11.5–15.5)
RDW: 17.3 % — AB (ref 11.5–15.5)
WBC: 6.5 10*3/uL (ref 4.0–10.5)
WBC: 8.8 10*3/uL (ref 4.0–10.5)

## 2015-05-15 LAB — BASIC METABOLIC PANEL
Anion gap: 8 (ref 5–15)
BUN: 13 mg/dL (ref 6–20)
CHLORIDE: 90 mmol/L — AB (ref 101–111)
CO2: 38 mmol/L — ABNORMAL HIGH (ref 22–32)
CREATININE: 0.51 mg/dL — AB (ref 0.61–1.24)
Calcium: 8.1 mg/dL — ABNORMAL LOW (ref 8.9–10.3)
GFR calc Af Amer: >60 mL/min (ref >60)
GFR calc non Af Amer: >60 mL/min (ref >60)
Glucose, Bld: 110 mg/dL — ABNORMAL HIGH (ref 65–99)
Potassium: 3.9 mmol/L (ref 3.5–5.1)
Sodium: 136 mmol/L (ref 135–145)

## 2015-05-15 LAB — PREPARE RBC (CROSSMATCH)

## 2015-05-15 LAB — PLATELET INHIBITION P2Y12: Platelet Function  P2Y12: 338 [PRU] (ref 194–418)

## 2015-05-15 LAB — GLUCOSE, CAPILLARY
GLUCOSE-CAPILLARY: 224 mg/dL — AB (ref 65–99)
Glucose-Capillary: 107 mg/dL — ABNORMAL HIGH (ref 65–99)
Glucose-Capillary: 156 mg/dL — ABNORMAL HIGH (ref 65–99)
Glucose-Capillary: 138 mg/dL — ABNORMAL HIGH (ref 65–99)

## 2015-05-15 MED ORDER — SENNOSIDES-DOCUSATE SODIUM 8.6-50 MG PO TABS
2.0000 | ORAL_TABLET | Freq: Two times a day (BID) | ORAL | Status: DC
  Administered 2015-05-15 – 2015-05-19 (×9): 2 via ORAL
  Filled 2015-05-15 (×9): qty 2

## 2015-05-15 MED ORDER — POLYETHYLENE GLYCOL 3350 17 G PO PACK
17.0000 g | PACK | Freq: Once | ORAL | Status: DC
  Filled 2015-05-15: qty 1

## 2015-05-15 MED ORDER — CLOPIDOGREL BISULFATE 75 MG PO TABS
75.0000 mg | ORAL_TABLET | Freq: Every day | ORAL | Status: DC
  Administered 2015-05-16 – 2015-05-19 (×4): 75 mg via ORAL
  Filled 2015-05-15 (×4): qty 1

## 2015-05-15 MED ORDER — KETOROLAC TROMETHAMINE 30 MG/ML IJ SOLN
30.0000 mg | Freq: Four times a day (QID) | INTRAMUSCULAR | Status: DC | PRN
  Administered 2015-05-15: 30 mg via INTRAVENOUS
  Administered 2015-05-15: 15 mg via INTRAVENOUS
  Administered 2015-05-16 (×2): 30 mg via INTRAVENOUS
  Filled 2015-05-15 (×4): qty 1

## 2015-05-15 MED ORDER — VITAMIN B-12 1000 MCG PO TABS
1000.0000 ug | ORAL_TABLET | Freq: Every day | ORAL | Status: DC
  Administered 2015-05-16 – 2015-05-19 (×4): 1000 ug via ORAL
  Filled 2015-05-15 (×5): qty 1

## 2015-05-15 MED ORDER — FOLIC ACID 5 MG/ML IJ SOLN
1.0000 mg | Freq: Every day | INTRAMUSCULAR | Status: DC
  Administered 2015-05-15: 1 mg via INTRAVENOUS
  Filled 2015-05-15 (×2): qty 0.2

## 2015-05-15 MED ORDER — IOHEXOL 300 MG/ML  SOLN
75.0000 mL | Freq: Once | INTRAMUSCULAR | Status: AC | PRN
  Administered 2015-05-15: 75 mL via INTRAVENOUS

## 2015-05-15 MED ORDER — CYANOCOBALAMIN 1000 MCG/ML IJ SOLN
1000.0000 ug | Freq: Once | INTRAMUSCULAR | Status: AC
  Administered 2015-05-15: 1000 ug via SUBCUTANEOUS
  Filled 2015-05-15: qty 1

## 2015-05-15 MED ORDER — SODIUM CHLORIDE 0.9 % IV SOLN
Freq: Once | INTRAVENOUS | Status: AC
  Administered 2015-05-15: 11:00:00 via INTRAVENOUS

## 2015-05-15 NOTE — Consult Note (Signed)
[address]       [place] [address]             [phone]        [facility] Medical Record #[id number] Date of Birth: July 04, 1951  Referring: No ref. provider found Primary Care: Jeanann Lewandowsky, MD  Chief Complaint:    Chief Complaint  Patient presents with  . Shortness of Breath  . Chest Pain   patient examined, recent chest CT scan and 2-D echocardiogram images personally reviewed  History of Present Illness:     Asked to evaluate this 64 year old Caucasian male smoker with severe COPD for recently diagnosed right upper lobe lung abscess versus infected bleb. Patient has had recurrent pulmonary infections requiring hospitalizations since last November. He was admitted to this hospital 10 days ago with decompensated COPD, shortness of breath and volume overload. He responded to therapy but then suddenly worsened 72 hours ago with progressive hypoxia and hypercarbia-PCO2 greater than 70 mmHg. Until then his chest x-ray had been with only mild abnormalities but now he has sudden appearance of a cavity 8 cm diameter in the anterior segment of the right upper lobe with an air-fluid level on CT scan. He continues on IV antibodies including vancomycin and Maxipime. He has a minimally productive cough. His  temperature and WBC have been normal  The patient has severe COPD with almost no activity tolerance. He is on home oxygen 3 L-4 L and uses a wheelchair which sits next to his bed. He is unable to walk. His baseline PCO2 over the past year has been greater than 70. He stopped smoking a few months ago.  The patient had an echocardiogram earlier this year which showed normal LV and RV systolic function without significant TR.  The patient has had prior coronary stents-the last was apparently 2015. He takes Plavix 75 mg daily.  The patient is on chronic prednisone and is a diabetic.  Current Activity/ Functional Status: No exercise tolerance-at  all.   Zubrod Score: At the time of surgery this patient's most appropriate activity status/level should be described as: []     0    Normal activity, no symptoms []     1    Restricted in physical strenuous activity but ambulatory, able to do out light work []     2    Ambulatory and capable of self care, unable to do work activities, up and about                 more than 50%  Of the time                            []     3    Only limited self care, in bed greater than 50% of waking hours [x]     4    Completely disabled, no self care, confined to bed or chair []     5    Moribund  Past Medical History  Diagnosis Date  . COPD (chronic obstructive pulmonary disease)   . CAD (coronary artery disease)   . Hypertension   . Hyperlipidemia   . Osteomyelitis   . Myocardial infarction     "I've had a couple since 2006" (12/26/2013)  . Anginal pain   . Asthma   . Pneumonia     "have had it several times; maybe now too": (12/26/2013)  . Shortness of breath     "  all the time" (12/26/2013)  . On home oxygen therapy     "2-3L; 24/7" (12/26/2013)  . GERD (gastroesophageal reflux disease)   . Daily headache   . Stroke     "they say I've had a couple" denies residual on 12/26/2013  . Arthritis     "hands; feet" (12/26/2013)  . Osteomyelitis hip     "right"  . Chronic lower back pain   . Diabetes mellitus without complication   . CHF (congestive heart failure)     Past Surgical History  Procedure Laterality Date  . Appendectomy  1963  . Cardiac catheterization  ? 2009; ?2012  . Coronary angioplasty with stent placement  2006; ?2008; ?2010    "2 + 2 + 1"    History  Smoking status  . Former Smoker -- 0.25 packs/day for 51 years  . Types: Cigarettes  . Quit date: 09/27/2014  Smokeless tobacco  . Never Used    History  Alcohol Use No    History   Social History  . Marital Status: Divorced    Spouse Name: N/A  . Number of Children: N/A  . Years of Education: N/A   Occupational  History  . Disabled    Social History Main Topics  . Smoking status: Former Smoker -- 0.25 packs/day for 51 years    Types: Cigarettes    Quit date: 09/27/2014  . Smokeless tobacco: Never Used  . Alcohol Use: No  . Drug Use: No  . Sexual Activity: Not Currently   Other Topics Concern  . Not on file   Social History Narrative    Allergies  Allergen Reactions  . Lipitor [Atorvastatin Calcium] Other (See Comments)    Muscle aches    Current Facility-Administered Medications  Medication Dose Route Frequency Provider Last Rate Last Dose  . 0.9 %  sodium chloride infusion   Intravenous Continuous Lupita Leashouglas B McQuaid, MD   Stopped at 05/13/15 1200  . 0.9 %  sodium chloride infusion   Intravenous Continuous Lupita Leashouglas B McQuaid, MD   Stopped at 05/13/15 1200  . acetaminophen (TYLENOL) tablet 650 mg  650 mg Oral Q6H PRN Ron ParkerHarvette C [name], MD   650 mg at 05/14/15 2037   Or  . acetaminophen (TYLENOL) suppository 650 mg  650 mg Rectal Q6H PRN Ron ParkerHarvette C [name], MD      . albuterol (PROVENTIL) (2.5 MG/3ML) 0.083% nebulizer solution 2.5 mg  2.5 mg Nebulization Q4H Simonne MartinetPeter E Babcock, NP   2.5 mg at 05/15/15 1243  . alum & mag hydroxide-simeth (MAALOX/MYLANTA) 200-200-20 MG/5ML suspension 30 mL  30 mL Oral PRN Rodolph Bonganiel Thompson V, MD   30 mL at 05/14/15 0053  . antiseptic oral rinse (CPC / CETYLPYRIDINIUM CHLORIDE 0.05%) solution 7 mL  7 mL Mouth Rinse BID Rodolph Bonganiel Thompson V, MD   7 mL at 05/14/15 0800  . arformoterol (BROVANA) nebulizer solution 15 mcg  15 mcg Nebulization BID Lupita Leashouglas B McQuaid, MD   15 mcg at 05/14/15 2107  . aspirin tablet 325 mg  325 mg Oral q morning - 10a Ron ParkerHarvette C [name], MD   325 mg at 05/15/15 1104  . benzonatate (TESSALON) capsule 200 mg  200 mg Oral TID Rodolph Bonganiel Thompson V, MD   200 mg at 05/15/15 1105  . budesonide (PULMICORT) nebulizer solution 0.5 mg  0.5 mg Nebulization Q12H Simonne MartinetPeter E Babcock, NP   0.5 mg at 05/15/15 0935  . ceFEPIme (MAXIPIME) 1 g in dextrose 5 % 50 mL  IVPB  1  g Intravenous 3 times per day Rolan Lipa, NP   1 g at 05/15/15 1105  . Chlorhexidine Gluconate Cloth 2 % PADS 6 each  6 each Topical Q0600 Lupita Leash, MD   6 each at 05/15/15 0600  . [START ON 05/16/2015] clopidogrel (PLAVIX) tablet 75 mg  75 mg Oral Daily Kerin Perna, MD      . enoxaparin (LOVENOX) injection 40 mg  40 mg Subcutaneous Q24H Ron Parker, MD   40 mg at 05/15/15 1102  . fentaNYL (SUBLIMAZE) injection 25 mcg  25 mcg Intravenous Q4H PRN Calvert Cantor, MD   25 mcg at 05/15/15 1610  . folic acid injection 1 mg  1 mg Intravenous Daily Calvert Cantor, MD   1 mg at 05/15/15 1330  . furosemide (LASIX) tablet 40 mg  40 mg Oral Daily Rodolph Bong, MD   40 mg at 05/15/15 1104  . guaiFENesin (MUCINEX) 12 hr tablet 1,200 mg  1,200 mg Oral BID Clydia Llano, MD   1,200 mg at 05/15/15 1104  . insulin aspart (novoLOG) injection 0-20 Units  0-20 Units Subcutaneous TID WC Rodolph Bong, MD   7 Units at 05/15/15 1333  . insulin glargine (LANTUS) injection 10 Units  10 Units Subcutaneous Daily Rodolph Bong, MD   10 Units at 05/15/15 1106  . ketorolac (TORADOL) 15 MG/ML injection 15 mg  15 mg Intravenous Q6H PRN Calvert Cantor, MD   15 mg at 05/15/15 0927  . ketorolac (TORADOL) 30 MG/ML injection 30 mg  30 mg Intravenous Q6H PRN Calvert Cantor, MD   15 mg at 05/15/15 1053  . magic mouthwash  15 mL Oral QID PRN Rodolph Bong, MD   15 mL at 05/15/15 0610  . metoprolol (LOPRESSOR) tablet 50 mg  50 mg Oral BID Rodolph Bong, MD   50 mg at 05/15/15 1103  . mupirocin ointment (BACTROBAN) 2 % 1 application  1 application Nasal BID Lupita Leash, MD   1 application at 05/15/15 1105  . nitroGLYCERIN (NITROGLYN) 2 % ointment 0.5 inch  0.5 inch Topical 4 times per day Ron Parker, MD   Stopped at 05/13/15 [phone]  . nystatin (MYCOSTATIN) 100000 UNIT/ML suspension 500,000 Units  5 mL Oral QID Rodolph Bong, MD   500,000 Units at 05/15/15 1334  .  ondansetron (ZOFRAN) injection 4 mg  4 mg Intravenous Q6H PRN Ron Parker, MD      . oxyCODONE (Oxy IR/ROXICODONE) immediate release tablet 5 mg  5 mg Oral Q6H PRN Rodolph Bong, MD   5 mg at 05/15/15 1315  . pantoprazole (PROTONIX) EC tablet 40 mg  40 mg Oral BID Lupita Leash, MD   40 mg at 05/15/15 1104  . polyethylene glycol (MIRALAX / GLYCOLAX) packet 17 g  17 g Oral Once Calvert Cantor, MD   17 g at 05/15/15 1045  . potassium chloride SA (K-DUR,KLOR-CON) CR tablet 40 mEq  40 mEq Oral Daily Rodolph Bong, MD   40 mEq at 05/15/15 1104  . predniSONE (DELTASONE) tablet 60 mg  60 mg Oral Q breakfast Calvert Cantor, MD   60 mg at 05/15/15 0853  . senna-docusate (Senokot-S) tablet 2 tablet  2 tablet Oral BID Calvert Cantor, MD   2 tablet at 05/15/15 1333  . sodium chloride 0.9 % bolus 750 mL  750 mL Intravenous Once Simonne Martinet, NP   750 mL at 05/12/15 1030  . sodium chloride 0.9 %  injection 3 mL  3 mL Intravenous Q12H Ron Parker, MD   3 mL at 05/15/15 1335  . vancomycin (VANCOCIN) 1,250 mg in sodium chloride 0.9 % 250 mL IVPB  1,250 mg Intravenous Q12H Robinette Haines, RPH   1,250 mg at 05/15/15 0857  . [START ON 05/16/2015] vitamin B-12 (CYANOCOBALAMIN) tablet 1,000 mcg  1,000 mcg Oral Daily Calvert Cantor, MD      . zolpidem (AMBIEN) tablet 5 mg  5 mg Oral QHS PRN Gerhard Munch Hammons, RPH   5 mg at 05/14/15 2142    Prescriptions prior to admission  Medication Sig Dispense Refill Last Dose  . albuterol (PROVENTIL) (2.5 MG/3ML) 0.083% nebulizer solution Take 3 mLs (2.5 mg total) by nebulization every 6 (six) hours as needed for wheezing or shortness of breath. 150 mL 9 05/04/2015 at Unknown time  . aspirin 325 MG tablet Take 1 tablet (325 mg total) by mouth every morning. 30 tablet 3 05/04/2015 at Unknown time  . clopidogrel (PLAVIX) 75 MG tablet Take 1 tablet (75 mg total) by mouth daily. 31 tablet 3 05/04/2015 at Unknown time  . fluticasone (FLONASE) 50 MCG/ACT nasal spray Place 2  sprays into both nostrils 2 (two) times daily. 16 g 2 unknown prn  . furosemide (LASIX) 40 MG tablet Take 40 mg by mouth daily.   05/04/2015 at Unknown time  . ipratropium-albuterol (DUONEB) 0.5-2.5 (3) MG/3ML SOLN Take 3 mLs by nebulization every 4 (four) hours as needed (sob).   05/04/2015 at Unknown time  . metFORMIN (GLUCOPHAGE) 500 MG tablet Take 1 tablet (500 mg total) by mouth 2 (two) times daily with a meal. 180 tablet 3 05/04/2015 at Unknown time  . metoprolol (LOPRESSOR) 50 MG tablet Take 1 tablet (50 mg total) by mouth 2 (two) times daily. 60 tablet 5 05/04/2015 at 1700  . pantoprazole (PROTONIX) 40 MG tablet Take 1 tablet (40 mg total) by mouth daily at 12 noon. 30 tablet 3 05/04/2015 at Unknown time  . potassium chloride SA (K-DUR,KLOR-CON) 20 MEQ tablet Take 1 tablet (20 mEq total) by mouth daily. 30 tablet 3 05/04/2015 at Unknown time  . predniSONE (DELTASONE) 10 MG tablet Take 3 pills (30 mg) daily 160 tablet 3 05/04/2015 at Unknown time  . terbutaline (BRETHINE) 2.5 MG tablet Take 1 tablet (2.5 mg total) by mouth every 6 (six) hours. 120 tablet 1 unknown at Unknown time  . traMADol (ULTRAM) 50 MG tablet Take 1 tablet (50 mg total) by mouth every 6 (six) hours as needed (for breakthrough pain). 60 tablet 0 Past Month at Unknown time  . albuterol (PROVENTIL HFA;VENTOLIN HFA) 108 (90 BASE) MCG/ACT inhaler Inhale 2 puffs into the lungs every 4 (four) hours as needed for wheezing or shortness of breath. (Patient not taking: Reported on 05/05/2015) 1 Inhaler 11 Not Taking at Unknown time  . albuterol (PROVENTIL) (2.5 MG/3ML) 0.083% nebulizer solution Take 3 mLs (2.5 mg total) by nebulization every 6 (six) hours as needed for wheezing or shortness of breath. Dx 496 360 mL 11   . arformoterol (BROVANA) 15 MCG/2ML NEBU Take 2 mLs (15 mcg total) by nebulization 2 (two) times daily. (Patient not taking: Reported on 05/05/2015) 120 mL 0 Not Taking at Unknown time  . furosemide (LASIX) 20 MG tablet TAKE 1  TABLET (20 MG TOTAL) BY MOUTH DAILY. 90 tablet 1 Taking  . furosemide (LASIX) 20 MG tablet Take 2 tablets (40 mg total) by mouth daily. 30 tablet 3   . ipratropium (ATROVENT)  0.02 % nebulizer solution Take 2.5 mLs (0.5 mg total) by nebulization every 4 (four) hours as needed for wheezing or shortness of breath. 300 mL 5   . mometasone-formoterol (DULERA) 100-5 MCG/ACT AERO Inhale 2 puffs into the lungs 2 (two) times daily. (Patient not taking: Reported on 05/05/2015) 3 Inhaler 3 Not Taking at Unknown time  . nystatin (MYCOSTATIN) 100000 UNIT/ML suspension Take 5 mLs (500,000 Units total) by mouth 4 (four) times daily. (Patient not taking: Reported on 05/05/2015) 60 mL 0 Completed Course at Unknown time    Family History  Problem Relation Age of Onset  . Coronary artery disease    . Hypertension       Review of Systems:       Cardiac Review of Systems: Y or N  Chest Pain [ yes   ]  Resting SOB [ yes  ] Exertional SOB  Mahler.Beck  ]  Orthopnea [ yes ]   Pedal Edema [  no ]    Palpitations [no  ] Syncope  [ no ]   Presyncope [no   ]  General Review of Systems: [Y] = yes [  ]=no Constitional: recent weight change [ yes weight lost 12 pounds ]; anorexia [  ]; fatigue Mahler.Beck  ]; nausea [  ]; night sweats [  ]; fever [  ]; or chills [  ]                                                               Dental: poor dentition[  ]; Last Dentist visit: Full dental plates   Eye : blurred vision [  ]; diplopia [   ]; vision changes [  ];  Amaurosis fugax[  ]; Resp: cough [  ];  wheezing[ yes ];  hemoptysis[  ]; shortness of breath[  ]; paroxysmal nocturnal dyspnea[  ]; dyspnea on exertion[yes  ]; or orthopnea[  ];  GI:  gallstones[  ], vomiting[  ];  dysphagia[  ]; melena[  ];  hematochezia [  ]; heartburn[  ];   Hx of  Colonoscopy[ yes-polyps ]; GU: kidney stones [  ]; hematuria[  ];   dysuria [  ];  nocturia[  ];  history of     obstruction [  ]; urinary frequency [  ]             Skin: rash, swelling[  ];, hair  loss[  ];  peripheral edema[  ];  or itching[  ]; Musculosketetal: myalgias[  ];  joint swelling[  ];  joint erythema[  ];  joint pain[ yes ];  back pain[yes  ];  Heme/Lymph: bruising[ yes-Plavix plus prednisone ];  bleeding[  ];  anemia[  ];  Neuro: TIA[  ];  headaches[  ];  stroke[  ];  vertigo[  ];  seizures[  ];   paresthesias[  ];  difficulty walking[  ];  Psych:depression[  ]; anxiety[  ];  Endocrine: diabetes[ yes ];  thyroid dysfunction[  ];  Immunizations: Flu [  ]; Pneumococcal[  ];  Other:  Physical Exam: BP 116/64 mmHg  Pulse 108  Temp(Src) 98.8 F (37.1 C) (Oral)  Resp 18  Ht  (1.803 m)  Wt 181 lb 9.6 oz (82.373 kg)  BMI 25.34 kg/m2  SpO2 100%       Physical Exam  General: Obese chronically ill Caucasian male with chronic bronchitis on oxygen. Mildly dyspneic at rest HEENT: Normocephalic pupils equal , dentition with full dental plates Neck: Supple without JVD, adenopathy, or bruit Chest: Scattered wheezes on  auscultation, symmetrical breath sounds,, no tenderness, increased A-P diameter of the chest  Cardiovascular: Regular rate and rhythm, no murmur, no gallop, peripheral pulses 1 +            in all extremities Abdomen:  Soft, nontender, no palpable mass or organomegaly Extremities: Warm, well-perfused, no clubbing cyanosis, mild pedal edema without tenderness,              no venous stasis changes of the legs Rectal/GU: Deferred Neuro: Grossly non--focal and symmetrical throughout Skin: Severe confluent areas of ecchymosis over both upper extremities Diagnostic Studies & Laboratory data:     Recent Radiology Findings:   Dg Chest 2 View  05/15/2015   CLINICAL DATA:  Pneumonia and right upper lobe abscess.  EXAM: CHEST - 2 VIEW  COMPARISON:  05/14/2015  FINDINGS: The anterior right upper lobe abscess containing an air-fluid level likely has enlarged with maximum diameter approaching 10 cm in the lateral projection. There is more fluid in the abscess with  air-fluid level visualized dependently. This abscess developed quite quickly and was not present on 05/04/2015. Followup CT of the chest would be helpful for further characterization, as there may be a component of bronchopleural fistula or overt communication with a bronchus. No pneumothorax is identified.  No pulmonary edema or significant pleural fluid identified. The heart size and mediastinal contours remain normal.  IMPRESSION: Enlarging right upper lobe pulmonary abscess. Followup CT of the chest would be helpful for further characterization, as there may be an overt communication with a bronchus or component of bronchopleural fistula.   Electronically Signed   By: Irish Lack M.D.   On: 05/15/2015 08:09   Dg Chest 2 View  05/14/2015   CLINICAL DATA:  Chest pain and cough.  EXAM: CHEST  2 VIEW  COMPARISON:  05/12/2015.  05/04/2015.  CT 03/20/2015 .  FINDINGS: Left PICC line stable position. Mediastinum hilar structures are normal. Cavitary lesion containing a small amount of fluid is noted in the right upper lobe. This could represent fluid within a bullae secondary to infection. Developing abscess cannot be excluded. No pleural effusion or pneumothorax. Stable cardiomegaly with normal pulmonary vascularity. Diffuse multilevel degenerative change with multiple compression fractures again noted.  IMPRESSION: 1.  Left PICC line stable position.  2. Right upper lobe bullae containing fluid, possibly secondary to infection. Developing pulmonary abscess cannot be excluded .   Electronically Signed   By: Maisie Fus  Register   On: 05/14/2015 08:24   Ct Chest W Contrast  05/15/2015   CLINICAL DATA:  Abnormal chest radiograph, right upper lobe pneumonia  EXAM: CT CHEST WITH CONTRAST  TECHNIQUE: Multidetector CT imaging of the chest was performed during intravenous contrast administration.  CONTRAST:  75mL OMNIPAQUE IOHEXOL 300 MG/ML  SOLN  COMPARISON:  Chest radiograph dated 05/15/2015. CTA chest dated  03/20/2015.  FINDINGS: Mediastinum/Nodes: Heart is normal in size. No pericardial effusion.  Coronary atherosclerosis. Postsurgical changes related to prior CABG.  Small mediastinal lymph nodes, including a 9 mm short axis right paratracheal node (series 2/ image 26), likely reactive.  Visualized thyroid is unremarkable.  Lungs/Pleura: 6.7 x 8.5 x 7.7 cm mildly thick-walled cavitary lesion in the anterior/medial right upper lobe (series 3/image 23  with associated air-fluid level and surrounding ground-glass opacity/inflammatory changes. Lesion was not present on prior CT. These findings are most suggestive of pulmonary abscess.  Underlying mild emphysematous changes.  No pleural effusion or pneumothorax.  Upper abdomen: Visualized upper abdomen is notable for vascular calcifications.  Musculoskeletal: Degenerative changes of the visualized thoracolumbar spine.  Multiple mild to moderate thoracic compression fracture deformities involving the mid to lower thoracic spine, chronic.  IMPRESSION: 8.5 cm cavitary lesion with air-fluid level and surrounding inflammatory changes in the medial right upper lobe, new from prior CT, compatible with pulmonary abscess.  9 mm short axis right paratracheal node, likely reactive.  Consider follow-up CT chest in 4-6 weeks to document improvement/resolution.   Electronically Signed   By: Charline Bills M.D.   On: 05/15/2015 13:56     I have independently reviewed the above radiologic studies.  Recent Lab Findings: Lab Results  Component Value Date   WBC 6.5 05/15/2015   HGB 7.6* 05/15/2015   HCT 24.9* 05/15/2015   PLT 166 05/15/2015   GLUCOSE 110* 05/15/2015   CHOL 208* 01/10/2014   TRIG 329* 01/10/2014   HDL 92 01/10/2014   LDLCALC 50 01/10/2014   ALT 79* 05/10/2015   AST 26 05/10/2015   NA 136 05/15/2015   K 3.9 05/15/2015   CL 90* 05/15/2015   CREATININE 0.51* 05/15/2015   BUN 13 05/15/2015   CO2 38* 05/15/2015   TSH 0.975 01/05/2014   INR 0.85  10/27/2014   HGBA1C 6.8* 05/07/2015      Assessment / Plan:      Patient with long and extended COPD related deterioration over the past several months now with acute development of a right upper lobe infected bleb versus abscess. He does not appear septic. He is on broad-spectrum IV antibodics  The patient is not a candidate for pulmonary resection and would probably not survive thoracotomy because of his severe COPD and severe deconditioning-frailty .  Recommend continue IV anti-biotics. Percutaneous pigtail catheter in the infected space by interventional radiology could be considered if he develops generalized sepsis. However,  Prognosis poor.  Consider changing Maxipime to Zosyn.    .    @ 05/15/2015 2:16 PM

## 2015-05-15 NOTE — Progress Notes (Signed)
PT Cancellation Note  Patient Details Name: Chris ProphetUlysses G Dirocco MRN: [id number] DOB: 12/13/1951   Cancelled Treatment:    Reason Eval/Treat Not Completed: Patient at procedure or test/unavailableWHEN Pt ATTEMPTED EARLIER. UNABLE TO RETURN THIS pm. RETURN Bea LauraOMORROW,   Rada HayHill, Chandra Feger Elizabeth 05/15/2015, 4:27 PM Blanchard KelchKaren Raziah Funnell PT [phone]

## 2015-05-15 NOTE — Progress Notes (Signed)
PULMONARY / CRITICAL CARE MEDICINE   Name: [name] MRN: [id number] DOB: 11/12/1951    ADMISSION DATE:  05/04/2015 CONSULTATION DATE:  5/23  REFERRING MD :  Janee Morn   CHIEF COMPLAINT:   Acute respiratory failure   INITIAL PRESENTATION:  64 yr male w/ chronic respiratory failure (on 3 liters at home) f/b [place] w/ COPD, also has h/o CAD and gd I diastolic dysfxn. Initially admitted 5/15 w/ working dx of AECOPD +/- decompensated diastolic heart failure and volume overload. Had been making slow improvements until the am of 5/23 when he developed acute respiratory distress and progressive hypoxia and hypercarbia. CXR w/ new RUL airspace disease. PCCM asked to assist w/ care.    STUDIES:  ECHO 5/18: EF 60-65%; Doppler parameters are consistent with abnormal left ventricularrelaxation (grade 1 diastolic dysfunction). CXR 5/24 >>> RUL bullae with fluid, ? Abscess. CXR 5/25 >>> enlarging RUL abscess.  SIGNIFICANT EVENTS: Transient BIPAP 5/23 AM   SUBJECTIVE: Still having SOB mainly with exertion only.  Also gets mildly dizzy if he tries to get up.  Also has cough, non-productive.  VITAL SIGNS: Temp:  [98.8 F (37.1 C)-99.1 F (37.3 C)] 98.8 F (37.1 C) (05/26 0452) Pulse Rate:  [81-108] 108 (05/26 0935) Resp:  [17-20] 18 (05/26 0935) BP: (109-134)/(64-82) 116/64 mmHg (05/26 0452) SpO2:  [97 %-100 %] 100 % (05/26 0935) Weight:  [82.373 kg (181 lb 9.6 oz)] 82.373 kg (181 lb 9.6 oz) (05/26 0452) HEMODYNAMICS:   VENTILATOR SETTINGS:   INTAKE / OUTPUT:  Intake/Output Summary (Last 24 hours) at 05/15/15 1124 Last data filed at 05/15/15 1045  Gross per 24 hour  Intake    240 ml  Output    500 ml  Net   -260 ml    PHYSICAL EXAMINATION: General:  Chronically ill appearing, resting in bed, in NAD. HENT: NCAT OP clear. Eyes: EOMi, sclera anicteric CV: RRR, no mgr. PULM: normal air movement, RUL rhonchi with very faint wheeze. GI: BS+, soft, nontender. Derm: chronic edema  noted, bruising extensor surfaces, thin skin. Neuro: Awake, alert, oriented x4, MAE's.  LABS:  CBC  Recent Labs Lab 05/13/15 0528 05/14/15 0555 05/15/15 0426  WBC 5.2 6.4 6.5  HGB 7.7* 7.8* 7.6*  HCT 25.1* 25.5* 24.9*  PLT 188 184 166   Coag's No results for input(s): APTT, INR in the last 168 hours. BMET  Recent Labs Lab 05/13/15 0528 05/14/15 0555 05/15/15 0426  NA 136 135 136  K 3.8 4.1 3.9  CL 88* 89* 90*  CO2 37* 36* 38*  BUN CREATININE 0.42* 0.44* 0.51*  GLUCOSE 200* 250* 110*   Electrolytes  Recent Labs Lab 05/13/15 0528 05/14/15 0555 05/15/15 0426  CALCIUM 8.3* 8.1* 8.1*  MG 2.2 2.2  --    Sepsis Markers  Recent Labs Lab 05/12/15 1003  LATICACIDVEN 0.9   ABG  Recent Labs Lab 05/12/15 0303 05/12/15 0906 05/12/15 1138  PHART 7.429 7.359 7.401  PCO2ART 64.2* 74.2* 68.5*  PO2ART 109* 117* 58.0*   Liver Enzymes  Recent Labs Lab 05/10/15 0349  AST 26  ALT 79*  ALKPHOS 143*  BILITOT 0.3  ALBUMIN 2.2*   Cardiac Enzymes  Recent Labs Lab 05/12/15 1025 05/12/15 1625 05/12/15 2225  TROPONINI 0.03 <0.03 <0.03   Glucose  Recent Labs Lab 05/13/15 2121 05/14/15 0744 05/14/15 1152 05/14/15 1703 05/14/15 2023 05/15/15 0806  GLUCAP 108* 223* 197* 222* 150* 107*    Imaging 5/25 CXR images personally reviewed> increasing RUL  abscess  ASSESSMENT / PLAN:  PULMONARY A: Acute on Chronic Respiratory failure in the setting of RUL HCAP complicated by upper airway wheezing from acid reflux/upper airway irritation and encephalopathy from narcotics ? RUL abscess - v/s large bullae Recent AE COPD> improving Severe COPD baseline, chronic prednisone use P:   Continue O2 as needed F/u CT chest (ordered by [facility] already) Albuterol prn Continue pulmicort, add brovana bid > would [facility] Continue PO prednisone (he is chronically on 30mg  / day) BID PPI > change to oral and maintain on BID dosing for  2-3 weeks after discharge Tessalon prn Repeat CXR in AM  INFECTIOUS A:  HCAP P:   BCx2 5/23>>> maxipime 5/23>>> vanc 5/23>>>  Ceftriaxone 5/17 >>> 5/20 Levaquin 5/20 >>> 5/23  NEUROLOGIC A:   Acute encephalopathy due primarily to narcotic over use complicated by hypercarbia; improved 5/24 after stopping dilaudid, percocet, oxycodone, and tramadol P:   Limit his narcotic usage Agree attempting to use other agents ie. Toradol instead   Rutherford Guysahul Desai, PA - [facility] Pager: [phone]  or [phone] 05/15/2015, 11:43 AM  Attending note:  64 year old male with acute on chronic respiratory failure secondary to COPD exacerbation and RUL PNA.    I reviewed CXR myself, enlarging likely necrotic pneumonia vs abscess, doubt a bullae given that it was not present on a CT I reviewed myself where none was present.  RUL PNA: likely necrotizing pneumonia  - Repeat CT now, I ordered.  - Continue cefepime and vanc.  - F/U on cultures.  - Will likely need involvement of CVTS.  COPD exacerbation:  - Albuterol prn  - Continue pulmicort, add brovana bid > would [facility] to [facility] discharge  - Continue PO prednisone (he is chronically on 30mg  / day)  - BID PPI > change to oral and maintain on BID dosing for 2-3 weeks after discharge  - Tessalon prn  Hypoxemia:  - Supplemental O2.  - Titrate O2 for sat of 88-92%.  - Will need ambulatory desaturation study for home O2 prior to discharge.  Acute encephalopathy: due to narcotics use.  - D/C narcotics.  - Minimize sedating drugs.  Discussed with primary MD and PCCM NP.  Patient seen and examined, agree with above note.  I dictated the care and orders written for this patient under my direction.  Alyson ReedyWesam G Candas Deemer, MD [phone]

## 2015-05-15 NOTE — Progress Notes (Addendum)
[facility] Progress Note   [name] VWU:[id number] DOB: [birth year] DOA: 05/04/2015 PCP: Jeanann Lewandowsky, MD  Brief narrative: [name] is a 64 y.o. male with history of chronic respiratory failure on home O2 at 3 L nasal cannula, CAD, chronic pain on chronic pain medications history of coronary artery disease with grade 1 diastolic dysfunction was initially admitted on 05/04/2015 with acute on chronic respiratory failure felt to be multifactorial secondary to acute COPD exacerbation and decompensated diastolic heart failure.   He was was slowly improving however on the morning of 05/12/2015 patient deteriorated and went into an acute respiratory failure with hypercarbia and was hypoxic and was subsequently transferred to the stepdown unit/ICU and placed on BiPAP. Chest x-ray obtained showed a new right upper lobe pneumonia. Anti-biotic coverage broadened for HCAP.   Patient also noted to have received multiple narcotics including Dilantin, Percocet, oxycodone, tramadol which may have decrease his respiratory drive leading to his respiratory distress. Narcotics were discontinued.   Subjective: Patient is complaining of pain in his mid upper back and this morning. Continues to have shortness of breath on exertion and a mild cough. I was called by the nurse later in the morning to be told that he is also complaining of pain in the substernal area which he now states has been present constantly since admission. He had no complaints of nausea vomiting abdominal pain or diarrhea.  Assessment/Plan: Principal Problem:   Acute on chronic respiratory failure - Initially due to COPD exacerbation and subsequently due HCAP /possible aspiration pneumonia  Active Problems: COPD exacerbation- chronic respiratory failure secondary to severe COPD -Requires 3 L of oxygen at baseline-he is at his baseline O2 requirements at this time -IV Solu-Medrol weaned to oral prednisone Prednisone -He  is on chronic prednisone at home which he states is 30 mg a day -Continue Pulmicort, Brovana and when necessary albuterol  HCAP/ right upper lobe abscess -Right upper lobe infiltrate noted when patient developed acute hypoxic rest for a failure on 5/23 -I am highly suspicious that this is related to aspiration -Chest x-ray repeated today shows increasing abscess with a suspicion for bronchopleural fistula -will obtain CT of chest -Continue vancomycin and cefepime-may need to consider CT of the chest to further evaluate -Pulmonary also following- have discussed chest x-ray findings with them today  Acute diastolic heart failure- history of pedal edema -Resolved -Has been transitioned to oral daily Lasix she takes for chronic pedal edema  -Continue lisinopril -2-D echo reveals a normal EF with only grade 1 diastolic dysfunction  Anemia of acute inflammation - vitamin B-12 and folic acid deficiency -Anemia panel performed on 5/20 reveals adequate iron  -Folic acid level slightly low at 5.0-will start replacing -Vitamin B 12 level low at 166-will give one subcutaneous injection of B-12 and then start oral replacement -Hemoglobin persistently less than 8- baseline appeared to be 8-9 -We'll transfuse one unit of packed red blood cells today  Ventricular tachycardia -Noted to have a few short runs on 5/24-Suspected to be secondary to being off of beta blocker-metoprolol has been resumed-continue to follow or recurrence  Chronic pain -States that he has chronic upper back pain due to vertebral compression fractures-I am unable to find any imaging in epic to prove this- we'll obtain a thoracic x-ray today -He was not being prescribed any narcotics as outpatient - Limiting narcotics at this point due to above-mentioned respiratory failure on 5/23 which was suspected to be due to excessive sedative medications -Continues to  complain of pain in his back and his chest which he says is 8 out of  10-We'll start IV Toradol-will not be discharging him home or to SNF with any narcotics-he understands this  Generalized weakness - Physical therapy recommending SNF versus 24-hour supervision at home-will be going to skilled nursing facility when he is stable  Morbid obesity   Code Status: Full code Family Communication:  Disposition Plan: SNF DVT prophylaxis:  Lovenox Consultants: Pulmonary Procedures:  2-D echo 5/18- EF 60-65%-grade 1 diastolic dysfunction  Antibiotics: Anti-infectives    Start     Dose/Rate Route Frequency Ordered Stop   05/15/15 0800  vancomycin (VANCOCIN) 1,250 mg in sodium chloride 0.9 % 250 mL IVPB     1,250 mg 166.7 mL/hr over 90 Minutes Intravenous Every 12 hours 05/14/15 1737     05/12/15 1600  vancomycin (VANCOCIN) IVPB 1000 mg/200 mL premix  Status:  Discontinued     1,000 mg 200 mL/hr over 60 Minutes Intravenous 3 times per day 05/12/15 1437 05/14/15 1737   05/12/15 0700  ceFEPIme (MAXIPIME) 1 g in dextrose 5 % 50 mL IVPB     1 g 100 mL/hr over 30 Minutes Intravenous 3 times per day 05/12/15 0557 05/20/15 0859   05/12/15 0700  vancomycin (VANCOCIN) IVPB 1000 mg/200 mL premix  Status:  Discontinued     1,000 mg 200 mL/hr over 60 Minutes Intravenous Every 8 hours 05/12/15 0603 05/12/15 0616   05/12/15 0630  vancomycin (VANCOCIN) IVPB 1000 mg/200 mL premix  Status:  Discontinued     1,000 mg 200 mL/hr over 60 Minutes Intravenous 3 times per day 05/12/15 0616 05/12/15 1437   05/09/15 1000  azithromycin (ZITHROMAX) tablet 500 mg  Status:  Discontinued     500 mg Oral Daily 05/08/15 1307 05/09/15 0840   05/09/15 1000  levofloxacin (LEVAQUIN) tablet 500 mg  Status:  Discontinued     500 mg Oral Daily 05/09/15 0840 05/12/15 0557   05/06/15 1300  cefTRIAXone (ROCEPHIN) 1 g in dextrose 5 % 50 mL IVPB - Premix  Status:  Discontinued     1 g 100 mL/hr over 30 Minutes Intravenous Every 24 hours 05/06/15 1204 05/09/15 0840   05/06/15 1300  azithromycin  (ZITHROMAX) 500 mg in dextrose 5 % 250 mL IVPB  Status:  Discontinued     500 mg 250 mL/hr over 60 Minutes Intravenous Every 24 hours 05/06/15 1204 05/08/15 1307      Objective: Filed Weights   05/13/15 0458 05/14/15 0400 05/15/15 0452  Weight: 84.5 kg (186 lb 4.6 oz) 85 kg (187 lb 6.3 oz) 82.373 kg (181 lb 9.6 oz)    Intake/Output Summary (Last 24 hours) at 05/15/15 1021 Last data filed at 05/15/15 0945  Gross per 24 hour  Intake      0 ml  Output    675 ml  Net   -675 ml     Vitals Filed Vitals:   05/14/15 2108 05/15/15 0003 05/15/15 0452 05/15/15 0935  BP:  109/69 116/64   Pulse: 91 81 89 108  Temp:  99.1 F (37.3 C) 98.8 F (37.1 C)   TempSrc:  Oral Oral   Resp: Height:    (1.803 m)   Weight:   82.373 kg (181 lb 9.6 oz)   SpO2: 100% 98% 100% 100%    Exam:  General:  Pt is alert, not in acute distress  HEENT: No icterus, No thrush  Cardiovascular: regular rate and  rhythm, S1/S2 No murmur  Respiratory: Extremely poor air entry bilaterally-no wheezing rhonchi or crackles  Abdomen: Soft, +Bowel sounds, non tender, non distended, no guarding  MSK: No LE edema, cyanosis or clubbing  Data Reviewed: Basic Metabolic Panel:  Recent Labs Lab 05/11/15 0600 05/12/15 0315 05/13/15 0528 05/14/15 0555 05/15/15 0426  NA 138 136 136 135 136  K 3.3* 3.7 3.8 4.1 3.9  CL 89* 86* 88* 89* 90*  CO2 42* 38* 37* 36* 38*  GLUCOSE 144* 81 200* 250* 110*  BUN 15 18 12 13 13   CREATININE 0.45* 0.54* 0.42* 0.44* 0.51*  CALCIUM 8.7* 8.5* 8.3* 8.1* 8.1*  MG  --   --  2.2 2.2  --    Liver Function Tests:  Recent Labs Lab 05/10/15 0349  AST 26  ALT 79*  ALKPHOS 143*  BILITOT 0.3  PROT 5.9*  ALBUMIN 2.2*   No results for input(s): LIPASE, AMYLASE in the last 168 hours. No results for input(s): AMMONIA in the last 168 hours. CBC:  Recent Labs Lab 05/11/15 0600 05/12/15 0315 05/13/15 0528 05/14/15 0555 05/15/15 0426  WBC 5.0 6.2 5.2 6.4 6.5   NEUTROABS  --   --  4.9  --   --   HGB 8.7* 9.4* 7.7* 7.8* 7.6*  HCT 29.3* 31.7* 25.1* 25.5* 24.9*  MCV 92.1 91.1 90.6 90.4 91.2  PLT 198 224 188 184 166   Cardiac Enzymes:  Recent Labs Lab 05/12/15 1025 05/12/15 1625 05/12/15 2225  TROPONINI 0.03 <0.03 <0.03   BNP (last 3 results)  Recent Labs  05/04/15 2318  BNP 136.0*    ProBNP (last 3 results)  Recent Labs  10/27/14 2311  PROBNP 89.0    CBG:  Recent Labs Lab 05/14/15 0744 05/14/15 1152 05/14/15 1703 05/14/15 2023 05/15/15 0806  GLUCAP 223* 197* 222* 150* 107*    Recent Results (from the past 240 hour(s))  Culture, blood (routine x 2) Call MD if unable to obtain prior to antibiotics being given     Status: None (Preliminary result)   Collection Time: 05/12/15  7:04 AM  Result Value Ref Range Status   Specimen Description BLOOD RIGHT ARM  Final   Special Requests BOTTLES DRAWN AEROBIC ONLY 5CC  Final   Culture   Final           BLOOD CULTURE RECEIVED NO GROWTH TO DATE CULTURE WILL BE HELD FOR 5 DAYS BEFORE ISSUING A FINAL NEGATIVE REPORT [facility]formed [facility]    Report Status PENDING  Incomplete  Culture, blood (routine x 2) Call MD if unable to obtain prior to antibiotics being given     Status: None (Preliminary result)   Collection Time: 05/12/15  7:14 AM  Result Value Ref Range Status   Specimen Description BLOOD RIGHT HAND  Final   Special Requests BOTTLES DRAWN AEROBIC ONLY 1CC  Final   Culture   Final           BLOOD CULTURE RECEIVED NO GROWTH TO DATE CULTURE WILL BE HELD FOR 5 DAYS BEFORE ISSUING A FINAL NEGATIVE REPORT Performed at [facility]    Report Status PENDING  Incomplete  MRSA PCR Screening     Status: Abnormal   Collection Time: 05/12/15  2:10 PM  Result Value Ref Range Status   MRSA by PCR POSITIVE (A) NEGATIVE Final    Comment:        The GeneXpert MRSA Assay (FDA approved for NASAL specimens only), is one component of  a comprehensive MRSA  colonization surveillance program. It is not intended to diagnose MRSA infection nor to guide or monitor treatment for MRSA infections. RESULT CALLED TO, READ BACK BY AND VERIFIED WITH: Liz Beach RN 15:55 05/12/15 ([email or web address])      Studies: Dg Chest 2 View  05/15/2015   CLINICAL DATA:  Pneumonia and right upper lobe abscess.  EXAM: CHEST - 2 VIEW  COMPARISON:  05/14/2015  FINDINGS: The anterior right upper lobe abscess containing an air-fluid level likely has enlarged with maximum diameter approaching 10 cm in the lateral projection. There is more fluid in the abscess with air-fluid level visualized dependently. This abscess developed quite quickly and was not present on 05/04/2015. Followup CT of the chest would be helpful for further characterization, as there may be a component of bronchopleural fistula or overt communication with a bronchus. No pneumothorax is identified.  No pulmonary edema or significant pleural fluid identified. The heart size and mediastinal contours remain normal.  IMPRESSION: Enlarging right upper lobe pulmonary abscess. Followup CT of the chest would be helpful for further characterization, as there may be an overt communication with a bronchus or component of bronchopleural fistula.   Electronically Signed   By: Irish Lack M.D.   On: 05/15/2015 08:09   Dg Chest 2 View  05/14/2015   CLINICAL DATA:  Chest pain and cough.  EXAM: CHEST  2 VIEW  COMPARISON:  05/12/2015.  05/04/2015.  CT 03/20/2015 .  FINDINGS: Left PICC line stable position. Mediastinum hilar structures are normal. Cavitary lesion containing a small amount of fluid is noted in the right upper lobe. This could represent fluid within a bullae secondary to infection. Developing abscess cannot be excluded. No pleural effusion or pneumothorax. Stable cardiomegaly with normal pulmonary vascularity. Diffuse multilevel degenerative change with multiple compression fractures again noted.  IMPRESSION: 1.  Left PICC  line stable position.  2. Right upper lobe bullae containing fluid, possibly secondary to infection. Developing pulmonary abscess cannot be excluded .   Electronically Signed   By: Maisie Fus  Register   On: 05/14/2015 08:24    Scheduled Meds:  Scheduled Meds: . albuterol  2.5 mg Nebulization Q4H  . antiseptic oral rinse  7 mL Mouth Rinse BID  . arformoterol  15 mcg Nebulization BID  . aspirin  325 mg Oral q morning - 10a  . benzonatate  200 mg Oral TID  . budesonide (PULMICORT) nebulizer solution  0.5 mg Nebulization Q12H  . ceFEPime (MAXIPIME) IV  1 g Intravenous 3 times per day  . Chlorhexidine Gluconate Cloth  6 each Topical Q0600  . clopidogrel  75 mg Oral Daily  . enoxaparin (LOVENOX) injection  40 mg Subcutaneous Q24H  . furosemide  40 mg Oral Daily  . guaiFENesin  1,200 mg Oral BID  . insulin aspart  0-20 Units Subcutaneous TID WC  . insulin glargine  10 Units Subcutaneous Daily  . metoprolol  50 mg Oral BID  . mupirocin ointment  1 application Nasal BID  . nitroGLYCERIN  0.5 inch Topical 4 times per day  . nystatin  5 mL Oral QID  . pantoprazole  40 mg Oral BID  . potassium chloride SA  40 mEq Oral Daily  . predniSONE  60 mg Oral Q breakfast  . sodium chloride  750 mL Intravenous Once  . sodium chloride  3 mL Intravenous Q12H  . vancomycin  1,250 mg Intravenous Q12H   Continuous Infusions: . sodium chloride Stopped (05/13/15 1200)  . sodium chloride  Stopped (05/13/15 1200)    Time spent on care of this patient: 35 minutes   Tanaja Ganger, MD 05/15/2015, 10:21 AM  LOS: 10 days   [facility] Office  [phone] Pager - Text Page per [email or web address]  If 7PM-7AM, please contact night-coverage [email or web address]

## 2015-05-15 NOTE — Care Management Note (Signed)
Case Management Note  Patient Details  Name: [name] MRN: [id number] Date of Birth: 07/07/1951  Subjective/Objective:      Pt admitted for Acute CHF. Treating for PNA with IV antibiotic therapy.           Action/Plan: [facility] Per CSW pt has 76 Medicare days left and [facility]. No further needs at this time.    Expected Discharge Date:                  Expected Discharge Plan:  Home w Home Health Services  In-House Referral:     Discharge planning Services  CM Consult  Post Acute Care Choice:  Home Health Choice offered to:  Patient  DME Arranged:    DME Agency:     HH Arranged:  RN, PT, OT, Nurse's Aide HH [facility]  Status of Service:  Completed, signed off  Medicare Important Message Given:  Yes Date Medicare IM Given:  05/12/15 Medicare IM give by:  debbie dowell rn,bsn Date Additional Medicare IM Given:   05-15-15 Additional Medicare Important Message give by: Tomi BambergerBrenda Graves-Bigelow, RN,BSN    If discussed at Long Length of Stay Meetings, dates discussed:    Additional Comments:  Gala LewandowskyGraves-Bigelow, Vira Chaplin Kaye, RN 05/15/2015, 2:02 PM

## 2015-05-16 ENCOUNTER — Inpatient Hospital Stay (HOSPITAL_COMMUNITY): Payer: Medicare Other

## 2015-05-16 LAB — BASIC METABOLIC PANEL
Anion gap: 7 (ref 5–15)
BUN: 9 mg/dL (ref 6–20)
CO2: 39 mmol/L — ABNORMAL HIGH (ref 22–32)
Calcium: 7.9 mg/dL — ABNORMAL LOW (ref 8.9–10.3)
Chloride: 89 mmol/L — ABNORMAL LOW (ref 101–111)
Creatinine, Ser: 0.44 mg/dL — ABNORMAL LOW (ref 0.61–1.24)
GFR calc Af Amer: >60 mL/min (ref >60)
GFR calc non Af Amer: >60 mL/min (ref >60)
Glucose, Bld: 159 mg/dL — ABNORMAL HIGH (ref 65–99)
Potassium: 3.6 mmol/L (ref 3.5–5.1)
Sodium: 135 mmol/L (ref 135–145)

## 2015-05-16 LAB — GLUCOSE, CAPILLARY
GLUCOSE-CAPILLARY: 80 mg/dL (ref 65–99)
Glucose-Capillary: 106 mg/dL — ABNORMAL HIGH (ref 65–99)
Glucose-Capillary: 247 mg/dL — ABNORMAL HIGH (ref 65–99)
Glucose-Capillary: 302 mg/dL — ABNORMAL HIGH (ref 65–99)

## 2015-05-16 LAB — TYPE AND SCREEN
ABO/RH(D): A NEG
Antibody Screen: NEGATIVE
UNIT DIVISION: 0

## 2015-05-16 LAB — CBC
HEMATOCRIT: 26.5 % — AB (ref 39.0–52.0)
Hemoglobin: 8.3 g/dL — ABNORMAL LOW (ref 13.0–17.0)
MCH: 27.7 pg (ref 26.0–34.0)
MCHC: 31.3 g/dL (ref 30.0–36.0)
MCV: 88.3 fL (ref 78.0–100.0)
PLATELETS: 166 10*3/uL (ref 150–400)
RBC: 3 MIL/uL — AB (ref 4.22–5.81)
RDW: 17.4 % — AB (ref 11.5–15.5)
WBC: 7.3 10*3/uL (ref 4.0–10.5)

## 2015-05-16 MED ORDER — FOLIC ACID 1 MG PO TABS
1.0000 mg | ORAL_TABLET | Freq: Every day | ORAL | Status: DC
  Administered 2015-05-16 – 2015-05-19 (×4): 1 mg via ORAL
  Filled 2015-05-16 (×4): qty 1

## 2015-05-16 MED ORDER — PREDNISONE 20 MG PO TABS
50.0000 mg | ORAL_TABLET | Freq: Every day | ORAL | Status: DC
  Administered 2015-05-17 – 2015-05-19 (×3): 50 mg via ORAL
  Filled 2015-05-16: qty 1
  Filled 2015-05-16 (×3): qty 2
  Filled 2015-05-16 (×2): qty 1

## 2015-05-16 MED ORDER — FENTANYL CITRATE (PF) 100 MCG/2ML IJ SOLN
12.5000 ug | Freq: Four times a day (QID) | INTRAMUSCULAR | Status: DC | PRN
  Administered 2015-05-16 – 2015-05-17 (×2): 12.5 ug via INTRAVENOUS
  Filled 2015-05-16 (×2): qty 2

## 2015-05-16 MED ORDER — TRAMADOL HCL 50 MG PO TABS
100.0000 mg | ORAL_TABLET | Freq: Four times a day (QID) | ORAL | Status: DC | PRN
  Administered 2015-05-16 – 2015-05-19 (×11): 100 mg via ORAL
  Filled 2015-05-16 (×11): qty 2

## 2015-05-16 NOTE — Clinical Social Work Note (Signed)
Patient has [facility] at [facility].  [name] MSW, [place], [place], [phone]

## 2015-05-16 NOTE — Progress Notes (Signed)
PT Cancellation Note  Patient Details Name: [name] MRN: [id number] DOB: 01/21/1951   Cancelled Treatment:    Reason Eval/Treat Not Completed: Other--Pt declined to participate at this time. States he would like to have PT lined up with his pain meds and respiratory treatments. Explained to pt that it is difficult to time it with respiratory(we don't know when they come by). Pt had already received toradol, tylenol, and oxycodone prior to my arrival. Will leave note downstairs to see if anyone is able to check back. Otherwise, it will be another day. Thanks.    Rebeca AlertJannie Camy Leder, MPT Pager: [phone]

## 2015-05-16 NOTE — Progress Notes (Signed)
Ruddy colored sputum expectorated 2.  Also offerred to go OOB today but indicated he is not ready.

## 2015-05-16 NOTE — Progress Notes (Signed)
[facility] Progress Note   [name] ZOX:[id number] DOB: [birth year] DOA: 05/04/2015 PCP: Jeanann Lewandowsky, MD  Brief narrative: [name] is a 64 y.o. male with history of chronic respiratory failure on home O2 at 3 L nasal cannula, CAD, chronic pain on chronic pain medications history of coronary artery disease with grade 1 diastolic dysfunction was initially admitted on 05/04/2015 with acute on chronic respiratory failure felt to be secondary to acute COPD exacerbation and decompensated diastolic heart failure.   He was was slowly improving however on the morning of 05/12/2015 patient deteriorated and went into an acute respiratory failure with hypercarbia and hypoxia and was subsequently transferred to the stepdown unit/ICU and placed on BiPAP. Chest x-ray obtained showed a new right upper lobe pneumonia. Antibiotic coverage broadened for HCAP.   Patient also noted to have received multiple narcotics including Dilantin, Percocet, oxycodone, tramadol which may have decrease his respiratory drive leading to his respiratory distress.   Subjective: I evaluated the patient earlier this morning. He continued to complain of mid upper back pain which was 8 out of 10. He tells me this pain started after a fall in January. He was told in [facility] that he had compression fractures but a kyphoplasty could not be performed due to his medical illnesses.  Assessment/Plan: Principal Problem:   Acute on chronic respiratory failure - Initially due to COPD exacerbation and subsequently due HCAP /possible aspiration pneumonia  Active Problems: COPD exacerbation- chronic respiratory failure secondary to severe COPD -Requires 3 L of oxygen at baseline-he is at his baseline O2 requirements at this time -IV Solu-Medrol weaned to oral prednisone -will wean to 50 mg tomorrow -He is on chronic prednisone at home which he states is 30 mg a day -Continue Pulmicort, Brovana and when necessary  albuterol  HCAP/ right upper lobe abscess -Right upper lobe infiltrate noted when patient developed acute hypoxic rest for a failure on 5/23 -I am highly suspicious that this is related to aspiration -5/26 Chest x-ray shows abscess with a suspicion for bronchopleural fistula - CT of chest- reveals right upper lobe abscess-pulmonary team consulted CT surgery-per CT surgery, he is not a candidate for surgery due to severe COPD-at the most, a drain could be placed if the abscess was not resolving -Continue vancomycin and cefepime- duration? -Pulmonary following  Chronic pain -Ongoing complaint of mid upper back pain since January which apparently is secondary to a fall with a resultant compression fracture-I am unable to [place] records [place] -X-ray of the thoracic spine obtained yesterday reveals multiple thoracic and upper lumbar vertebral compression deformities -Cording to the patient, he was not a candidate for vertebral plasty when evaluated at [facility] therefore at this time pain management is the only option for him -However due to above-mentioned episode of respiratory distressed and aspiration, we are trying to limit his narcotics use-he was not on narcotics as outpatient  -I started Toradol yesterday for a short period in hopes to get him weaned off of fentanyl-despite this the patient is asking to increase narcotics which I have told him we will not be doing-he has a heating pad to his back as well -We'll wean his fentanyl back to 12.5 mg every 6 hours -He will need an outpatient referral to pain management-his primary care physician appears to be the [place] [facility] where, unfortunately narcotics are not prescribed - Will change oxycodone to tramadol  Acute diastolic heart failure- history of pedal edema -Resolved -  Has been transitioned to oral daily Lasix which he takes for chronic pedal edema  -Continue lisinopril -2-D echo reveals a normal EF  with only grade 1 diastolic dysfunction  Anemia of acute inflammation - vitamin B-12 and folic acid deficiency -Anemia panel performed on 5/20 reveals adequate iron  -Folic acid level slightly low at 5.0-will start replacing -Vitamin B 12 level low at 166-will give one subcutaneous injection of B-12 and then start oral replacement -Hemoglobin persistently less than 8- baseline appeared to be 8-9 -transfused one unit of packed red blood cells on 5/26-11 improved to 8.3  Ventricular tachycardia -Noted to have a few short runs on 5/24-Suspected to be secondary to being off of beta blocker-metoprolol has been resumed-continue to follow or recurrence  Generalized weakness - Physical therapy recommending SNF versus 24-hour supervision at home-will be going to skilled nursing facility when he is stable  Morbid obesity   Code Status: Full code Family Communication:  Disposition Plan: SNF DVT prophylaxis:  Lovenox Consultants: Pulmonary Procedures:  2-D echo 5/18- EF 60-65%-grade 1 diastolic dysfunction  Antibiotics: Anti-infectives    Start     Dose/Rate Route Frequency Ordered Stop   05/15/15 0800  vancomycin (VANCOCIN) 1,250 mg in sodium chloride 0.9 % 250 mL IVPB     1,250 mg 166.7 mL/hr over 90 Minutes Intravenous Every 12 hours 05/14/15 1737     05/12/15 1600  vancomycin (VANCOCIN) IVPB 1000 mg/200 mL premix  Status:  Discontinued     1,000 mg 200 mL/hr over 60 Minutes Intravenous 3 times per day 05/12/15 1437 05/14/15 1737   05/12/15 0700  ceFEPIme (MAXIPIME) 1 g in dextrose 5 % 50 mL IVPB     1 g 100 mL/hr over 30 Minutes Intravenous 3 times per day 05/12/15 0557 05/20/15 0859   05/12/15 0700  vancomycin (VANCOCIN) IVPB 1000 mg/200 mL premix  Status:  Discontinued     1,000 mg 200 mL/hr over 60 Minutes Intravenous Every 8 hours 05/12/15 0603 05/12/15 0616   05/12/15 0630  vancomycin (VANCOCIN) IVPB 1000 mg/200 mL premix  Status:  Discontinued     1,000 mg 200 mL/hr over 60  Minutes Intravenous 3 times per day 05/12/15 0616 05/12/15 1437   05/09/15 1000  azithromycin (ZITHROMAX) tablet 500 mg  Status:  Discontinued     500 mg Oral Daily 05/08/15 1307 05/09/15 0840   05/09/15 1000  levofloxacin (LEVAQUIN) tablet 500 mg  Status:  Discontinued     500 mg Oral Daily 05/09/15 0840 05/12/15 0557   05/06/15 1300  cefTRIAXone (ROCEPHIN) 1 g in dextrose 5 % 50 mL IVPB - Premix  Status:  Discontinued     1 g 100 mL/hr over 30 Minutes Intravenous Every 24 hours 05/06/15 1204 05/09/15 0840   05/06/15 1300  azithromycin (ZITHROMAX) 500 mg in dextrose 5 % 250 mL IVPB  Status:  Discontinued     500 mg 250 mL/hr over 60 Minutes Intravenous Every 24 hours 05/06/15 1204 05/08/15 1307      Objective: Filed Weights   05/14/15 0400 05/15/15 0452 05/16/15 0440  Weight: 85 kg (187 lb 6.3 oz) 82.373 kg (181 lb 9.6 oz) 85.049 kg (187 lb 8 oz)    Intake/Output Summary (Last 24 hours) at 05/16/15 1200 Last data filed at 05/16/15 1114  Gross per 24 hour  Intake    575 ml  Output   1100 ml  Net   -525 ml     Vitals Filed Vitals:   05/16/15 0309 05/16/15  0440 05/16/15 0813 05/16/15 1112  BP:  128/73 123/73 155/88  Pulse:  78 93 98  Temp:  97.9 F (36.6 C) 97.7 F (36.5 C) 97.9 F (36.6 C)  TempSrc:  Oral Oral Oral  Resp:  14 18 18   Height:  5\' 11"  (1.803 m)    Weight:  85.049 kg (187 lb 8 oz)    SpO2: 100% 100% 100% 98%    Exam:  General:  Pt is alert, not in acute distress  HEENT: No icterus, No thrush  Cardiovascular: regular rate and rhythm, S1/S2 No murmur  Respiratory: Extremely poor air entry bilaterally-no wheezing rhonchi or crackles  Abdomen: Soft, +Bowel sounds, non tender, non distended, no guarding  MSK: No LE edema, cyanosis or clubbing  Data Reviewed: Basic Metabolic Panel:  Recent Labs Lab 05/12/15 0315 05/13/15 0528 05/14/15 0555 05/15/15 0426 05/16/15 0528  NA 136 136 135 136 135  K 3.7 3.8 4.1 3.9 3.6  CL 86* 88* 89* 90* 89*   CO2 38* 37* 36* 38* 39*  GLUCOSE 81 200* 250* 110* 159*  BUN 18 12 13 13 9   CREATININE 0.54* 0.42* 0.44* 0.51* 0.44*  CALCIUM 8.5* 8.3* 8.1* 8.1* 7.9*  MG  --  2.2 2.2  --   --    Liver Function Tests:  Recent Labs Lab 05/10/15 0349  AST 26  ALT 79*  ALKPHOS 143*  BILITOT 0.3  PROT 5.9*  ALBUMIN 2.2*   No results for input(s): LIPASE, AMYLASE in the last 168 hours. No results for input(s): AMMONIA in the last 168 hours. CBC:  Recent Labs Lab 05/13/15 0528 05/14/15 0555 05/15/15 0426 05/15/15 2220 05/16/15 0528  WBC 5.2 6.4 6.5 8.8 7.3  NEUTROABS 4.9  --   --   --   --   HGB 7.7* 7.8* 7.6* 8.1* 8.3*  HCT 25.1* 25.5* 24.9* 25.9* 26.5*  MCV 90.6 90.4 91.2 88.4 88.3  PLT 188 184 166 161 166   Cardiac Enzymes:  Recent Labs Lab 05/12/15 1025 05/12/15 1625 05/12/15 2225  TROPONINI 0.03 <0.03 <0.03   BNP (last 3 results)  Recent Labs  05/04/15 2318  BNP 136.0*    ProBNP (last 3 results)  Recent Labs  10/27/14 2311  PROBNP 89.0    CBG:  Recent Labs Lab 05/15/15 1143 05/15/15 1620 05/15/15 2202 05/16/15 0750 05/16/15 1111  GLUCAP 224* 156* 138* 106* 247*    Recent Results (from the past 240 hour(s))  Culture, blood (routine x 2) Call MD if unable to obtain prior to antibiotics being given     Status: None (Preliminary result)   Collection Time: 05/12/15  7:04 AM  Result Value Ref Range Status   Specimen Description BLOOD RIGHT ARM  Final   Special Requests BOTTLES DRAWN AEROBIC ONLY 5CC  Final   Culture   Final           BLOOD CULTURE RECEIVED NO GROWTH TO DATE CULTURE WILL BE HELD FOR 5 DAYS BEFORE ISSUING A FINAL NEGATIVE REPORT Performed at [facility]    Report Status PENDING  Incomplete  Culture, blood (routine x 2) Call MD if unable to obtain prior to antibiotics being given     Status: None (Preliminary result)   Collection Time: 05/12/15  7:14 AM  Result Value Ref Range Status   Specimen Description BLOOD RIGHT HAND   Final   Special Requests BOTTLES DRAWN AEROBIC ONLY 1CC  Final   Culture   Final  BLOOD CULTURE RECEIVED NO GROWTH TO DATE CULTURE WILL BE HELD FOR 5 DAYS BEFORE ISSUING A FINAL NEGATIVE REPORT Performed at [facility]    Report Status PENDING  Incomplete  MRSA PCR Screening     Status: Abnormal   Collection Time: 05/12/15  2:10 PM  Result Value Ref Range Status   MRSA by PCR POSITIVE (A) NEGATIVE Final    Comment:        The GeneXpert MRSA Assay (FDA approved for NASAL specimens only), is one component of a comprehensive MRSA colonization surveillance program. It is not intended to diagnose MRSA infection nor to guide or monitor treatment for MRSA infections. RESULT CALLED TO, READ BACK BY AND VERIFIED WITH: Liz Beach RN 15:55 05/12/15 ([email or web address])      Studies: Dg Chest 2 View  05/15/2015   CLINICAL DATA:  Pneumonia and right upper lobe abscess.  EXAM: CHEST - 2 VIEW  COMPARISON:  05/14/2015  FINDINGS: The anterior right upper lobe abscess containing an air-fluid level likely has enlarged with maximum diameter approaching 10 cm in the lateral projection. There is more fluid in the abscess with air-fluid level visualized dependently. This abscess developed quite quickly and was not present on 05/04/2015. Followup CT of the chest would be helpful for further characterization, as there may be a component of bronchopleural fistula or overt communication with a bronchus. No pneumothorax is identified.  No pulmonary edema or significant pleural fluid identified. The heart size and mediastinal contours remain normal.  IMPRESSION: Enlarging right upper lobe pulmonary abscess. Followup CT of the chest would be helpful for further characterization, as there may be an overt communication with a bronchus or component of bronchopleural fistula.   Electronically Signed   By: Irish Lack M.D.   On: 05/15/2015 08:09   Dg Thoracic Spine 2 View  05/15/2015   CLINICAL DATA:   Diffuse back pain following a recent fall. History of chronic mid back pain.  EXAM: THORACIC SPINE - 2 VIEW  COMPARISON:  Chest CT obtained earlier today.  FINDINGS: Previously noted multiple chronic thoracic and upper lumbar vertebral compression deformities. No acute fractures or subluxations seen. One of the lower thoracic vertebral compression deformities has a linear vacuum phenomenon beneath the superior endplate, unchanged from the CT earlier. Left PICC tip in the inferior aspect of the superior vena cava. Thoracic and upper lumbar spine degenerative changes. The previously noted right upper lobe cavitary lesion is again demonstrated.  IMPRESSION: 1. Stable multiple thoracic and upper lumbar vertebral compression deformities compared to the CT earlier today. These were also previously compared to previous radiographs and shown to be stable. 2. Left PICC tip in the inferior aspect of the superior vena cava. If a position at the cavoatrial junction is desired, it would be recommended that this be advanced 2 cm. 3. Stable right upper lobe cavitary lesion.   Electronically Signed   By: Beckie Salts M.D.   On: 05/15/2015 21:41   Ct Chest W Contrast  05/15/2015   CLINICAL DATA:  Abnormal chest radiograph, right upper lobe pneumonia  EXAM: CT CHEST WITH CONTRAST  TECHNIQUE: Multidetector CT imaging of the chest was performed during intravenous contrast administration.  CONTRAST:  75mL OMNIPAQUE IOHEXOL 300 MG/ML  SOLN  COMPARISON:  Chest radiograph dated 05/15/2015. CTA chest dated 03/20/2015.  FINDINGS: Mediastinum/Nodes: Heart is normal in size. No pericardial effusion.  Coronary atherosclerosis. Postsurgical changes related to prior CABG.  Small mediastinal lymph nodes, including a 9 mm short axis right  paratracheal node (series 2/ image 26), likely reactive.  Visualized thyroid is unremarkable.  Lungs/Pleura: 6.7 x 8.5 x 7.7 cm mildly thick-walled cavitary lesion in the anterior/medial right upper lobe  (series 3/image 23 with associated air-fluid level and surrounding ground-glass opacity/inflammatory changes. Lesion was not present on prior CT. These findings are most suggestive of pulmonary abscess.  Underlying mild emphysematous changes.  No pleural effusion or pneumothorax.  Upper abdomen: Visualized upper abdomen is notable for vascular calcifications.  Musculoskeletal: Degenerative changes of the visualized thoracolumbar spine.  Multiple mild to moderate thoracic compression fracture deformities involving the mid to lower thoracic spine, chronic.  IMPRESSION: 8.5 cm cavitary lesion with air-fluid level and surrounding inflammatory changes in the medial right upper lobe, new from prior CT, compatible with pulmonary abscess.  9 mm short axis right paratracheal node, likely reactive.  Consider follow-up CT chest in 4-6 weeks to document improvement/resolution.   Electronically Signed   By: Charline BillsSriyesh  Krishnan M.D.   On: 05/15/2015 13:56   Dg Chest Port 1 View  05/16/2015   CLINICAL DATA:  Shortness of breath, respiratory failure  EXAM: PORTABLE CHEST - 1 VIEW  COMPARISON:  05/15/2015  FINDINGS: Medial right upper lobe large cavitary mass again evident consistent with a pulmonary abscess when compared to yesterday's chest CT. This measures approximate 8.8 x 8.3 cm by plain radiography. Left lung remains clear. No developing effusion. Negative for pneumothorax. Trachea midline. Normal heart size and vascularity. Left PICC line tip mid SVC level.  IMPRESSION: Medial right upper lobe large cavitary mass compatible with a pulmonary abscess. No new finding by plain radiography.   Electronically Signed   By: Judie PetitM.  Shick M.D.   On: 05/16/2015 07:56    Scheduled Meds:  Scheduled Meds: . albuterol  2.5 mg Nebulization Q4H  . antiseptic oral rinse  7 mL Mouth Rinse BID  . arformoterol  15 mcg Nebulization BID  . aspirin  325 mg Oral q morning - 10a  . benzonatate  200 mg Oral TID  . budesonide (PULMICORT)  nebulizer solution  0.5 mg Nebulization Q12H  . ceFEPime (MAXIPIME) IV  1 g Intravenous 3 times per day  . Chlorhexidine Gluconate Cloth  6 each Topical Q0600  . clopidogrel  75 mg Oral Daily  . enoxaparin (LOVENOX) injection  40 mg Subcutaneous Q24H  . folic acid  1 mg Intravenous Daily  . furosemide  40 mg Oral Daily  . guaiFENesin  1,200 mg Oral BID  . insulin aspart  0-20 Units Subcutaneous TID WC  . insulin glargine  10 Units Subcutaneous Daily  . metoprolol  50 mg Oral BID  . mupirocin ointment  1 application Nasal BID  . nitroGLYCERIN  0.5 inch Topical 4 times per day  . nystatin  5 mL Oral QID  . pantoprazole  40 mg Oral BID  . polyethylene glycol  17 g Oral Once  . potassium chloride SA  40 mEq Oral Daily  . predniSONE  60 mg Oral Q breakfast  . senna-docusate  2 tablet Oral BID  . sodium chloride  750 mL Intravenous Once  . sodium chloride  3 mL Intravenous Q12H  . vancomycin  1,250 mg Intravenous Q12H  . vitamin B-12  1,000 mcg Oral Daily   Continuous Infusions: . sodium chloride Stopped (05/13/15 1200)  . sodium chloride Stopped (05/13/15 1200)    Time spent on care of this patient: 35 minutes   Veeda Virgo, MD 05/16/2015, 12:00 PM  LOS: 11 days  [facility] Office  [phone] Pager - Text Page per [email or web address]  If 7PM-7AM, please contact night-coverage [email or web address]

## 2015-05-17 DIAGNOSIS — J852 Abscess of lung without pneumonia: Secondary | ICD-10-CM

## 2015-05-17 LAB — EXPECTORATED SPUTUM ASSESSMENT W GRAM STAIN, RFLX TO RESP C

## 2015-05-17 LAB — EXPECTORATED SPUTUM ASSESSMENT W REFEX TO RESP CULTURE

## 2015-05-17 LAB — GLUCOSE, CAPILLARY
GLUCOSE-CAPILLARY: 106 mg/dL — AB (ref 65–99)
Glucose-Capillary: 159 mg/dL — ABNORMAL HIGH (ref 65–99)
Glucose-Capillary: 175 mg/dL — ABNORMAL HIGH (ref 65–99)

## 2015-05-17 LAB — VANCOMYCIN, TROUGH: VANCOMYCIN TR: 19 ug/mL (ref 10.0–20.0)

## 2015-05-17 MED ORDER — FENTANYL 12 MCG/HR TD PT72
12.5000 ug | MEDICATED_PATCH | TRANSDERMAL | Status: DC
  Administered 2015-05-17: 12.5 ug via TRANSDERMAL
  Filled 2015-05-17: qty 1

## 2015-05-17 MED ORDER — FENTANYL CITRATE (PF) 100 MCG/2ML IJ SOLN
12.5000 ug | Freq: Three times a day (TID) | INTRAMUSCULAR | Status: DC | PRN
  Administered 2015-05-17 – 2015-05-19 (×6): 12.5 ug via INTRAVENOUS
  Filled 2015-05-17 (×7): qty 2

## 2015-05-17 MED ORDER — DEXTROSE 5 % IV SOLN
2.0000 g | Freq: Two times a day (BID) | INTRAVENOUS | Status: DC
  Administered 2015-05-17 – 2015-05-19 (×5): 2 g via INTRAVENOUS
  Filled 2015-05-17 (×8): qty 2

## 2015-05-17 MED ORDER — PIPERACILLIN-TAZOBACTAM 3.375 G IVPB
3.3750 g | Freq: Three times a day (TID) | INTRAVENOUS | Status: DC
  Filled 2015-05-17 (×3): qty 50

## 2015-05-17 MED ORDER — GUAIFENESIN 100 MG/5ML PO SOLN
10.0000 mL | Freq: Four times a day (QID) | ORAL | Status: DC
  Administered 2015-05-17 – 2015-05-18 (×2): 200 mg via ORAL
  Filled 2015-05-17 (×11): qty 10

## 2015-05-17 NOTE — Progress Notes (Signed)
PULMONARY / CRITICAL CARE MEDICINE   Name: [name] MRN: [id number] DOB: 1951-08-25    ADMISSION DATE:  05/04/2015 CONSULTATION DATE:  5/23  REFERRING MD :  Janee Morn   CHIEF COMPLAINT:   Acute respiratory failure   INITIAL PRESENTATION:  64 yr male w/ chronic respiratory failure (on 3 liters at home) f/b [place] w/ COPD, also has h/o CAD and gd I diastolic dysfxn. Initially admitted 5/15 w/ working dx of AECOPD +/- decompensated diastolic heart failure and volume overload. Had been making slow improvements until the am of 5/23 when he developed acute respiratory distress and progressive hypoxia and hypercarbia. CXR w/ new RUL airspace disease. PCCM asked to assist w/ care.    STUDIES:  ECHO 5/18: EF 60-65%; Doppler parameters are consistent with abnormal left ventricularrelaxation (grade 1 diastolic dysfunction). CXR 5/24 >>> RUL bullae with fluid, ? Abscess. CXR 5/25 >>> enlarging RUL abscess.  SIGNIFICANT EVENTS: Transient BIPAP 5/23 AM   SUBJECTIVE:  Still prod cough  VITAL SIGNS: Temp:  [97.7 F (36.5 C)-98 F (36.7 C)] 97.7 F (36.5 C) (05/28 0541) Pulse Rate:  [86-95] 86 (05/28 0541) Resp:  [15] 15 (05/27 2047) BP: (119-142)/(65-75) 119/65 mmHg (05/28 0949) SpO2:  [96 %-100 %] 98 % (05/28 0754) Weight:  [86.501 kg (190 lb 11.2 oz)] 86.501 kg (190 lb 11.2 oz) (05/28 0541)       INTAKE / OUTPUT:  Intake/Output Summary (Last 24 hours) at 05/17/15 1209 Last data filed at 05/17/15 0455  Gross per 24 hour  Intake    480 ml  Output    800 ml  Net   -320 ml    PHYSICAL EXAMINATION: General:  Chronically ill appearing, resting in bed, in NAD. HENT: NCAT OP clear. Eyes: EOMi, sclera anicteric CV: RRR, no mgr. PULM: normal air movement, RUL rhonchi with very faint wheeze. GI: BS+, soft, nontender. Derm: chronic edema noted, bruising extensor surfaces, thin skin. Neuro: Awake, alert, oriented x4, MAE's.  LABS:  CBC  Recent Labs Lab 05/15/15 0426  05/15/15 2220 05/16/15 0528  WBC 6.5 8.8 7.3  HGB 7.6* 8.1* 8.3*  HCT 24.9* 25.9* 26.5*  PLT 166 161 166   Coag's No results for input(s): APTT, INR in the last 168 hours. BMET  Recent Labs Lab 05/14/15 0555 05/15/15 0426 05/16/15 0528  NA 135 136 135  K 4.1 3.9 3.6  CL 89* 90* 89*  CO2 36* 38* 39*  BUN CREATININE 0.44* 0.51* 0.44*  GLUCOSE 250* 110* 159*   Electrolytes  Recent Labs Lab 05/13/15 0528 05/14/15 0555 05/15/15 0426 05/16/15 0528  CALCIUM 8.3* 8.1* 8.1* 7.9*  MG 2.2 2.2  --   --    Sepsis Markers  Recent Labs Lab 05/12/15 1003  LATICACIDVEN 0.9   ABG  Recent Labs Lab 05/12/15 0303 05/12/15 0906 05/12/15 1138  PHART 7.429 7.359 7.401  PCO2ART 64.2* 74.2* 68.5*  PO2ART 109* 117* 58.0*   Liver Enzymes No results for input(s): AST, ALT, ALKPHOS, BILITOT, ALBUMIN in the last 168 hours. Cardiac Enzymes  Recent Labs Lab 05/12/15 1025 05/12/15 1625 05/12/15 2225  TROPONINI 0.03 <0.03 <0.03   Glucose  Recent Labs Lab 05/16/15 0750 05/16/15 1111 05/16/15 1600 05/16/15 2057 05/17/15 0724 05/17/15 1119  GLUCAP 106* 247* 302* 80 106* 159*    Imaging 5/28 CXR images personally reviewed>RUL cavity , less air fluid level  ASSESSMENT / PLAN:  PULMONARY A: Acute on Chronic Respiratory failure in the setting of RUL HCAP complicated  by upper airway wheezing from acid reflux/upper airway irritation and encephalopathy from narcotics RUL abscess -centered near RUL bullae Recent AE COPD> improving Severe COPD baseline, chronic prednisone use P:   Continue O2 as needed Cont BD therapy Narrow ABX coverage, cefepime should be adequate No need for zosyn/vanco Cont cefepime through weekend and then change to PO Levaquin x 2 weeks  INFECTIOUS A:  HCAP P:   BCx2 5/23>>>neg Sputum cult 5/28>>> maxipime 5/23>>>d/c 5/27 >>resume 5/28>> Zosyn  5/27>> 5/28 vanc 5/23>>> 5/28 Ceftriaxone 5/17 >>> 5/20 Levaquin 5/20 >>> 5/23  I  will ask PCCM to f/u on this pt through next week  Caryl Bisatrick WrightMD Beeper  [phone]  Cell  [phone]  If no response or cell goes to voicemail, call beeper [phone]  05/17/2015, 12:09 PM

## 2015-05-17 NOTE — Progress Notes (Signed)
[facility] Progress Note   Chris ProphetUlysses G [name] JXB:[id number] DOB: 04/27/1951 DOA: 05/04/2015 PCP: Jeanann LewandowskyJEGEDE, OLUGBEMIGA, MD  Brief narrative: [name] is a 64 y.o. male with history of chronic respiratory failure on home O2 at 3 L nasal cannula, CAD, chronic pain on chronic pain medications history of coronary artery disease with grade 1 diastolic dysfunction was initially admitted on 05/04/2015 with acute on chronic respiratory failure felt to be secondary to acute COPD exacerbation and decompensated diastolic heart failure.   He was was slowly improving however on the morning of 05/12/2015 patient deteriorated and went into an acute respiratory failure with hypercarbia and hypoxia and was subsequently transferred to the stepdown unit/ICU and placed on BiPAP. Chest x-ray obtained showed a new right upper lobe pneumonia. Antibiotic coverage broadened for HCAP.   Patient also noted to have received multiple narcotics including Dilantin, Percocet, oxycodone, tramadol which may have decrease his respiratory drive leading to his respiratory distress.   Subjective: Patient is alert, complaining of back chest pain. Started to cough small amount of blood. Sputum, is more clear.  He wants to speak with SW regarding SNF, and if his insurance would pay for it.   Assessment/Plan:    Acute on chronic respiratory failure - Initially due to COPD exacerbation and subsequently due HCAP /possible aspiration pneumonia  Active Problems: COPD exacerbation- chronic respiratory failure secondary to severe COPD -Requires 3 L of oxygen at baseline-he is at his baseline O2 requirements at this time -continue with prednisone.  -He is on chronic prednisone at home which he states is 30 mg a day -Continue Pulmicort, Brovana and when necessary albuterol  HCAP/ right upper lobe abscess -Right upper lobe infiltrate noted when patient developed acute hypoxic rest for a failure on 5/23 -I am highly suspicious  that this is related to aspiration -5/26 Chest x-ray shows abscess with a suspicion for bronchopleural fistula - CT of chest- reveals right upper lobe abscess-pulmonary team consulted CT surgery-per CT surgery, he is not a candidate for surgery due to severe COPD-at the most, a drain could be placed if the abscess was not resolving -Appreciate Dr Delford FieldWright assistance. Vancomycin discontinue. Continue with cefepime. Transition to Levaquin next week for 2 weeks.  -Pulmonary following  Chronic pain -X-ray of the thoracic spine obtained yesterday reveals multiple thoracic and upper lumbar vertebral compression deformities -Acording to the patient, he was not a candidate for vertebral plasty when evaluated at [facility] therefore at this time pain management is the only option for him. -However due to above-mentioned episode of respiratory distressed and aspiration, we are trying to limit his narcotics use-he was not on narcotics as outpatient  -will discontinue Toradol due to hemoptysis.  -He will need an outpatient referral to pain management-his primary care physician appears to be [facility] where, unfortunately narcotics are not prescribed - now on tramadol.  -will order fentanyl patch and try to decrease IV fentanyl.   Acute diastolic heart failure- history of pedal edema -Resolved -Continue lisinopril and lasix.  -2-D echo reveals a normal EF with only grade 1 diastolic dysfunction  Anemia of acute inflammation - vitamin B-12 and folic acid deficiency -Anemia panel performed on 5/20 reveals adequate iron  -Folic acid level slightly low at 5.0-will start replacing -Vitamin B 12 level low at 166 -recieved one dose Subc. Now on oral supplement -Hemoglobin persistently less than 8- baseline appeared to be 8-9 -Transfused one unit of packed red blood cells on 5/26-11  improved to 8.3 -hb stable.   Ventricular tachycardia -Noted to have a few short runs on  5/24-Suspected to be secondary to being off of beta blocker-metoprolol has been resumed-continue to follow or recurrence  Generalized weakness - Physical therapy recommending SNF versus 24-hour supervision at home-will be going to skilled nursing facility when he is stable  Morbid obesity   Code Status: Full code Family Communication:  Disposition Plan: SNF DVT prophylaxis:  Lovenox Consultants: Pulmonary Procedures:  2-D echo 5/18- EF 60-65%-grade 1 diastolic dysfunction  Antibiotics: Anti-infectives    Start     Dose/Rate Route Frequency Ordered Stop   05/15/15 0800  vancomycin (VANCOCIN) 1,250 mg in sodium chloride 0.9 % 250 mL IVPB     1,250 mg 166.7 mL/hr over 90 Minutes Intravenous Every 12 hours 05/14/15 1737     05/12/15 1600  vancomycin (VANCOCIN) IVPB 1000 mg/200 mL premix  Status:  Discontinued     1,000 mg 200 mL/hr over 60 Minutes Intravenous 3 times per day 05/12/15 1437 05/14/15 1737   05/12/15 0700  ceFEPIme (MAXIPIME) 1 g in dextrose 5 % 50 mL IVPB  Status:  Discontinued     1 g 100 mL/hr over 30 Minutes Intravenous 3 times per day 05/12/15 0557 05/17/15 0854   05/12/15 0700  vancomycin (VANCOCIN) IVPB 1000 mg/200 mL premix  Status:  Discontinued     1,000 mg 200 mL/hr over 60 Minutes Intravenous Every 8 hours 05/12/15 0603 05/12/15 0616   05/12/15 0630  vancomycin (VANCOCIN) IVPB 1000 mg/200 mL premix  Status:  Discontinued     1,000 mg 200 mL/hr over 60 Minutes Intravenous 3 times per day 05/12/15 0616 05/12/15 1437   05/09/15 1000  azithromycin (ZITHROMAX) tablet 500 mg  Status:  Discontinued     500 mg Oral Daily 05/08/15 1307 05/09/15 0840   05/09/15 1000  levofloxacin (LEVAQUIN) tablet 500 mg  Status:  Discontinued     500 mg Oral Daily 05/09/15 0840 05/12/15 0557   05/06/15 1300  cefTRIAXone (ROCEPHIN) 1 g in dextrose 5 % 50 mL IVPB - Premix  Status:  Discontinued     1 g 100 mL/hr over 30 Minutes Intravenous Every 24 hours 05/06/15 1204 05/09/15 0840    05/06/15 1300  azithromycin (ZITHROMAX) 500 mg in dextrose 5 % 250 mL IVPB  Status:  Discontinued     500 mg 250 mL/hr over 60 Minutes Intravenous Every 24 hours 05/06/15 1204 05/08/15 1307      Objective: Filed Weights   05/15/15 0452 05/16/15 0440 05/17/15 0541  Weight: 82.373 kg (181 lb 9.6 oz) 85.049 kg (187 lb 8 oz) 86.501 kg (190 lb 11.2 oz)    Intake/Output Summary (Last 24 hours) at 05/17/15 0914 Last data filed at 05/17/15 0455  Gross per 24 hour  Intake    480 ml  Output    950 ml  Net   -470 ml     Vitals Filed Vitals:   05/17/15 0004 05/17/15 0417 05/17/15 0541 05/17/15 0754  BP:   142/72   Pulse:   86   Temp:   97.7 F (36.5 C)   TempSrc:   Oral   Resp:      Height:      Weight:   86.501 kg (190 lb 11.2 oz)   SpO2: 100% 100% 96% 98%    Exam:  General:  Pt is alert, not in acute distress  HEENT: No icterus, No thrush  Cardiovascular: regular rate and rhythm, S1/S2 No  murmur  Respiratory: Extremely poor air entry bilaterally-  Abdomen: Soft, +Bowel sounds, non tender, non distended, no guarding  MSK: No LE edema, cyanosis or clubbing  Data Reviewed: Basic Metabolic Panel:  Recent Labs Lab 05/12/15 0315 05/13/15 0528 05/14/15 0555 05/15/15 0426 05/16/15 0528  NA 136 136 135 136 135  K 3.7 3.8 4.1 3.9 3.6  CL 86* 88* 89* 90* 89*  CO2 38* 37* 36* 38* 39*  GLUCOSE 81 200* 250* 110* 159*  BUN CREATININE 0.54* 0.42* 0.44* 0.51* 0.44*  CALCIUM 8.5* 8.3* 8.1* 8.1* 7.9*  MG  --  2.2 2.2  --   --    Liver Function Tests: No results for input(s): AST, ALT, ALKPHOS, BILITOT, PROT, ALBUMIN in the last 168 hours. No results for input(s): LIPASE, AMYLASE in the last 168 hours. No results for input(s): AMMONIA in the last 168 hours. CBC:  Recent Labs Lab 05/13/15 0528 05/14/15 0555 05/15/15 0426 05/15/15 2220 05/16/15 0528  WBC 5.2 6.4 6.5 8.8 7.3  NEUTROABS 4.9  --   --   --   --   HGB 7.7* 7.8* 7.6* 8.1* 8.3*  HCT  25.1* 25.5* 24.9* 25.9* 26.5*  MCV 90.6 90.4 91.2 88.4 88.3  PLT 188 184 166 161 166   Cardiac Enzymes:  Recent Labs Lab 05/12/15 1025 05/12/15 1625 05/12/15 2225  TROPONINI 0.03 <0.03 <0.03   BNP (last 3 results)  Recent Labs  05/04/15 2318  BNP 136.0*    ProBNP (last 3 results)  Recent Labs  10/27/14 2311  PROBNP 89.0    CBG:  Recent Labs Lab 05/16/15 0750 05/16/15 1111 05/16/15 1600 05/16/15 2057 05/17/15 0724  GLUCAP 106* 247* 302* 80 106*    Recent Results (from the past 240 hour(s))  Culture, blood (routine x 2) Call MD if unable to obtain prior to antibiotics being given     Status: None (Preliminary result)   Collection Time: 05/12/15  7:04 AM  Result Value Ref Range Status   Specimen Description BLOOD RIGHT ARM  Final   Special Requests BOTTLES DRAWN AEROBIC ONLY 5CC  Final   Culture   Final           BLOOD CULTURE RECEIVED NO GROWTH TO DATE CULTURE WILL BE HELD FOR 5 DAYS BEFORE ISSUING A FINAL NEGATIVE REPORT Performed at [facility]    Report Status PENDING  Incomplete  Culture, blood (routine x 2) Call MD if unable to obtain prior to antibiotics being given     Status: None (Preliminary result)   Collection Time: 05/12/15  7:14 AM  Result Value Ref Range Status   Specimen Description BLOOD RIGHT HAND  Final   Special Requests BOTTLES DRAWN AEROBIC ONLY 1CC  Final   Culture   Final           BLOOD CULTURE RECEIVED NO GROWTH TO DATE CULTURE WILL BE HELD FOR 5 DAYS BEFORE ISSUING A FINAL NEGATIVE REPORT Performed at [facility]    Report Status PENDING  Incomplete  MRSA PCR Screening     Status: Abnormal   Collection Time: 05/12/15  2:10 PM  Result Value Ref Range Status   MRSA by PCR POSITIVE (A) NEGATIVE Final    Comment:        The GeneXpert MRSA Assay (FDA approved for NASAL specimens only), is one component of a comprehensive MRSA colonization surveillance program. It is not intended to diagnose MRSA infection  nor to guide  or monitor treatment for MRSA infections. RESULT CALLED TO, READ BACK BY AND VERIFIED WITH: Liz Beach RN 15:55 05/12/15 ([email or web address])   Culture, sputum-assessment     Status: None   Collection Time: 05/17/15  5:10 AM  Result Value Ref Range Status   Specimen Description SPUTUM  Final   Special Requests NONE  Final   Sputum evaluation   Final    THIS SPECIMEN IS ACCEPTABLE. RESPIRATORY CULTURE REPORT TO FOLLOW.   Report Status 05/17/2015 FINAL  Final     Studies: Dg Thoracic Spine 2 View  05/15/2015   CLINICAL DATA:  Diffuse back pain following a recent fall. History of chronic mid back pain.  EXAM: THORACIC SPINE - 2 VIEW  COMPARISON:  Chest CT obtained earlier today.  FINDINGS: Previously noted multiple chronic thoracic and upper lumbar vertebral compression deformities. No acute fractures or subluxations seen. One of the lower thoracic vertebral compression deformities has a linear vacuum phenomenon beneath the superior endplate, unchanged from the CT earlier. Left PICC tip in the inferior aspect of the superior vena cava. Thoracic and upper lumbar spine degenerative changes. The previously noted right upper lobe cavitary lesion is again demonstrated.  IMPRESSION: 1. Stable multiple thoracic and upper lumbar vertebral compression deformities compared to the CT earlier today. These were also previously compared to previous radiographs and shown to be stable. 2. Left PICC tip in the inferior aspect of the superior vena cava. If a position at the cavoatrial junction is desired, it would be recommended that this be advanced 2 cm. 3. Stable right upper lobe cavitary lesion.   Electronically Signed   By: Beckie Salts M.D.   On: 05/15/2015 21:41   Ct Chest W Contrast  05/15/2015   CLINICAL DATA:  Abnormal chest radiograph, right upper lobe pneumonia  EXAM: CT CHEST WITH CONTRAST  TECHNIQUE: Multidetector CT imaging of the chest was performed during intravenous contrast administration.   CONTRAST:  75mL OMNIPAQUE IOHEXOL 300 MG/ML  SOLN  COMPARISON:  Chest radiograph dated 05/15/2015. CTA chest dated 03/20/2015.  FINDINGS: Mediastinum/Nodes: Heart is normal in size. No pericardial effusion.  Coronary atherosclerosis. Postsurgical changes related to prior CABG.  Small mediastinal lymph nodes, including a 9 mm short axis right paratracheal node (series 2/ image 26), likely reactive.  Visualized thyroid is unremarkable.  Lungs/Pleura: 6.7 x 8.5 x 7.7 cm mildly thick-walled cavitary lesion in the anterior/medial right upper lobe (series 3/image 23 with associated air-fluid level and surrounding ground-glass opacity/inflammatory changes. Lesion was not present on prior CT. These findings are most suggestive of pulmonary abscess.  Underlying mild emphysematous changes.  No pleural effusion or pneumothorax.  Upper abdomen: Visualized upper abdomen is notable for vascular calcifications.  Musculoskeletal: Degenerative changes of the visualized thoracolumbar spine.  Multiple mild to moderate thoracic compression fracture deformities involving the mid to lower thoracic spine, chronic.  IMPRESSION: 8.5 cm cavitary lesion with air-fluid level and surrounding inflammatory changes in the medial right upper lobe, new from prior CT, compatible with pulmonary abscess.  9 mm short axis right paratracheal node, likely reactive.  Consider follow-up CT chest in 4-6 weeks to document improvement/resolution.   Electronically Signed   By: Charline Bills M.D.   On: 05/15/2015 13:56   Dg Chest Port 1 View  05/16/2015   CLINICAL DATA:  Shortness of breath, respiratory failure  EXAM: PORTABLE CHEST - 1 VIEW  COMPARISON:  05/15/2015  FINDINGS: Medial right upper lobe large cavitary mass again evident consistent with a pulmonary abscess when compared  to yesterday's chest CT. This measures approximate 8.8 x 8.3 cm by plain radiography. Left lung remains clear. No developing effusion. Negative for pneumothorax. Trachea  midline. Normal heart size and vascularity. Left PICC line tip mid SVC level.  IMPRESSION: Medial right upper lobe large cavitary mass compatible with a pulmonary abscess. No new finding by plain radiography.   Electronically Signed   By: Judie Petit.  Shick M.D.   On: 05/16/2015 07:56    Scheduled Meds:  Scheduled Meds: . albuterol  2.5 mg Nebulization Q4H  . antiseptic oral rinse  7 mL Mouth Rinse BID  . arformoterol  15 mcg Nebulization BID  . aspirin  325 mg Oral q morning - 10a  . benzonatate  200 mg Oral TID  . budesonide (PULMICORT) nebulizer solution  0.5 mg Nebulization Q12H  . Chlorhexidine Gluconate Cloth  6 each Topical Q0600  . clopidogrel  75 mg Oral Daily  . enoxaparin (LOVENOX) injection  40 mg Subcutaneous Q24H  . fentaNYL  12.5 mcg Transdermal Q72H  . folic acid  1 mg Oral Daily  . furosemide  40 mg Oral Daily  . guaiFENesin  1,200 mg Oral BID  . insulin aspart  0-20 Units Subcutaneous TID WC  . insulin glargine  10 Units Subcutaneous Daily  . metoprolol  50 mg Oral BID  . mupirocin ointment  1 application Nasal BID  . nitroGLYCERIN  0.5 inch Topical 4 times per day  . nystatin  5 mL Oral QID  . pantoprazole  40 mg Oral BID  . polyethylene glycol  17 g Oral Once  . potassium chloride SA  40 mEq Oral Daily  . predniSONE  50 mg Oral Q breakfast  . senna-docusate  2 tablet Oral BID  . sodium chloride  750 mL Intravenous Once  . sodium chloride  3 mL Intravenous Q12H  . vancomycin  1,250 mg Intravenous Q12H  . vitamin B-12  1,000 mcg Oral Daily   Continuous Infusions: . sodium chloride Stopped (05/13/15 1200)  . sodium chloride Stopped (05/13/15 1200)    Time spent on care of this patient: 35 minutes   Andrzej Scully A, MD 05/17/2015, 9:14 AM  LOS: 12 days  [phone] [facility] Office  [phone] Pager - Text Page per [email or web address]  If 7PM-7AM, please contact night-coverage [email or web address]

## 2015-05-17 NOTE — Progress Notes (Signed)
ANTIBIOTIC CONSULT NOTE - FOLLOW UP  Pharmacy Consult for Vancomycin and Zosyn Indication: HCAP  Allergies  Allergen Reactions  . Lipitor [Atorvastatin Calcium] Other (See Comments)    Muscle aches    Patient Measurements: Height: 5\' 11"  (180.3 cm) Weight: 190 lb 11.2 oz (86.501 kg) IBW/kg (Calculated) : 75.3  Vital Signs: Temp: 97.7 F (36.5 C) (05/28 0541) Temp Source: Oral (05/28 0541) BP: 142/72 mmHg (05/28 0541) Pulse Rate: 86 (05/28 0541) Intake/Output from previous day: 05/27 0701 - 05/28 0700 In: 720 [P.O.:720] Out: 1050 [Urine:1050] Intake/Output from this shift:    Labs:  Recent Labs  05/15/15 0426 05/15/15 2220 05/16/15 0528  WBC 6.5 8.8 7.3  HGB 7.6* 8.1* 8.3*  PLT 166 161 166  CREATININE 0.51*  --  0.44*   Estimated Creatinine Clearance: 98 mL/min (by C-G formula based on Cr of 0.44).  Recent Labs  05/14/15 1500  VANCOTROUGH 24*     Microbiology: Recent Results (from the past 720 hour(s))  Culture, blood (routine x 2) Call MD if unable to obtain prior to antibiotics being given     Status: None (Preliminary result)   Collection Time: 05/12/15  7:04 AM  Result Value Ref Range Status   Specimen Description BLOOD RIGHT ARM  Final   Special Requests BOTTLES DRAWN AEROBIC ONLY 5CC  Final   Culture   Final           BLOOD CULTURE RECEIVED NO GROWTH TO DATE CULTURE WILL BE HELD FOR 5 DAYS BEFORE ISSUING A FINAL NEGATIVE REPORT Performed at [facility]    Report Status PENDING  Incomplete  Culture, blood (routine x 2) Call MD if unable to obtain prior to antibiotics being given     Status: None (Preliminary result)   Collection Time: 05/12/15  7:14 AM  Result Value Ref Range Status   Specimen Description BLOOD RIGHT HAND  Final   Special Requests BOTTLES DRAWN AEROBIC ONLY 1CC  Final   Culture   Final           BLOOD CULTURE RECEIVED NO GROWTH TO DATE CULTURE WILL BE HELD FOR 5 DAYS BEFORE ISSUING A FINAL NEGATIVE REPORT Performed at  [facility]    Report Status PENDING  Incomplete  MRSA PCR Screening     Status: Abnormal   Collection Time: 05/12/15  2:10 PM  Result Value Ref Range Status   MRSA by PCR POSITIVE (A) NEGATIVE Final    Comment:        The GeneXpert MRSA Assay (FDA approved for NASAL specimens only), is one component of a comprehensive MRSA colonization surveillance program. It is not intended to diagnose MRSA infection nor to guide or monitor treatment for MRSA infections. RESULT CALLED TO, READ BACK BY AND VERIFIED WITH: Liz BeachP. STRAMOSKI RN 15:55 05/12/15 ([email or web address])   Culture, sputum-assessment     Status: None   Collection Time: 05/17/15  5:10 AM  Result Value Ref Range Status   Specimen Description SPUTUM  Final   Special Requests NONE  Final   Sputum evaluation   Final    THIS SPECIMEN IS ACCEPTABLE. RESPIRATORY CULTURE REPORT TO FOLLOW.   Report Status 05/17/2015 FINAL  Final    Anti-infectives    Start     Dose/Rate Route Frequency Ordered Stop   05/15/15 0800  vancomycin (VANCOCIN) 1,250 mg in sodium chloride 0.9 % 250 mL IVPB     1,250 mg 166.7 mL/hr over 90 Minutes Intravenous Every 12 hours 05/14/15 1737  05/12/15 1600  vancomycin (VANCOCIN) IVPB 1000 mg/200 mL premix  Status:  Discontinued     1,000 mg 200 mL/hr over 60 Minutes Intravenous 3 times per day 05/12/15 1437 05/14/15 1737   05/12/15 0700  ceFEPIme (MAXIPIME) 1 g in dextrose 5 % 50 mL IVPB  Status:  Discontinued     1 g 100 mL/hr over 30 Minutes Intravenous 3 times per day 05/12/15 0557 05/17/15 0854   05/12/15 0700  vancomycin (VANCOCIN) IVPB 1000 mg/200 mL premix  Status:  Discontinued     1,000 mg 200 mL/hr over 60 Minutes Intravenous Every 8 hours 05/12/15 0603 05/12/15 0616   05/12/15 0630  vancomycin (VANCOCIN) IVPB 1000 mg/200 mL premix  Status:  Discontinued     1,000 mg 200 mL/hr over 60 Minutes Intravenous 3 times per day 05/12/15 0616 05/12/15 1437   05/09/15 1000  azithromycin (ZITHROMAX) tablet  500 mg  Status:  Discontinued     500 mg Oral Daily 05/08/15 1307 05/09/15 0840   05/09/15 1000  levofloxacin (LEVAQUIN) tablet 500 mg  Status:  Discontinued     500 mg Oral Daily 05/09/15 0840 05/12/15 0557   05/06/15 1300  cefTRIAXone (ROCEPHIN) 1 g in dextrose 5 % 50 mL IVPB - Premix  Status:  Discontinued     1 g 100 mL/hr over 30 Minutes Intravenous Every 24 hours 05/06/15 1204 05/09/15 0840   05/06/15 1300  azithromycin (ZITHROMAX) 500 mg in dextrose 5 % 250 mL IVPB  Status:  Discontinued     500 mg 250 mL/hr over 60 Minutes Intravenous Every 24 hours 05/06/15 1204 05/08/15 1307      Assessment: 63 YOM on day #12 of abx and day #6 of vancomycin and cefepime for HCAP. He remains afebrile, WBC wnl and renal function has been stable, with est. crcl ~ 90-100 ml/min. On chest CT was noted to have pulmonary abscess. Per Dr. Dennie Maizes recommendations, will d/c cefepime and initiate Zosyn for anaerobic coverage.   IV azithromycin 05/06/2015>>>>05/08/15 Rocephin 05/06/2015>>>>>05/08/15 Oral levaquin 05/08/15>>>>> 05/12/2015  vancomycin 05/12/2015>> cefepime 05/12/2015>>5/28 Zosyn 5/28>>  5/25 VT>>24 (dose decreased to 1250 q12h) 5/28 VT>>19  5/23 Bld Cx2>>NGTD   Goal of Therapy:  Vancomycin trough level 15-20 mcg/ml  Plan:  - Continue vancomycin 1250 mg IV Q 12 hrs - D/c cefepime - Initiate Zosyn 3.375 gm IV q8h (4h infusion) - Monitor renal function and f/u cultures  Alyxandria Wentz K. Bonnye Fava, PharmD, BCPS Clinical Pharmacist - Resident Pager: [phone] Pharmacy: [phone] 05/17/2015 9:19 AM

## 2015-05-18 LAB — BASIC METABOLIC PANEL
Anion gap: 6 (ref 5–15)
BUN: 8 mg/dL (ref 6–20)
CALCIUM: 8.3 mg/dL — AB (ref 8.9–10.3)
CO2: 37 mmol/L — AB (ref 22–32)
Chloride: 90 mmol/L — ABNORMAL LOW (ref 101–111)
Creatinine, Ser: 0.44 mg/dL — ABNORMAL LOW (ref 0.61–1.24)
GFR calc Af Amer: >60 mL/min (ref >60)
GFR calc non Af Amer: >60 mL/min (ref >60)
Glucose, Bld: 88 mg/dL (ref 65–99)
POTASSIUM: 3.9 mmol/L (ref 3.5–5.1)
Sodium: 133 mmol/L — ABNORMAL LOW (ref 135–145)

## 2015-05-18 LAB — CULTURE, BLOOD (ROUTINE X 2)
CULTURE: NO GROWTH
Culture: NO GROWTH

## 2015-05-18 LAB — GLUCOSE, CAPILLARY
GLUCOSE-CAPILLARY: 207 mg/dL — AB (ref 65–99)
GLUCOSE-CAPILLARY: 148 mg/dL — AB (ref 65–99)
GLUCOSE-CAPILLARY: 190 mg/dL — AB (ref 65–99)
Glucose-Capillary: 94 mg/dL (ref 65–99)
Glucose-Capillary: 189 mg/dL — ABNORMAL HIGH (ref 65–99)
Glucose-Capillary: 207 mg/dL — ABNORMAL HIGH (ref 65–99)

## 2015-05-18 LAB — CBC
HEMATOCRIT: 28.2 % — AB (ref 39.0–52.0)
Hemoglobin: 8.8 g/dL — ABNORMAL LOW (ref 13.0–17.0)
MCH: 27.2 pg (ref 26.0–34.0)
MCHC: 31.2 g/dL (ref 30.0–36.0)
MCV: 87.3 fL (ref 78.0–100.0)
Platelets: 188 10*3/uL (ref 150–400)
RBC: 3.23 MIL/uL — ABNORMAL LOW (ref 4.22–5.81)
RDW: 16 % — ABNORMAL HIGH (ref 11.5–15.5)
WBC: 7.4 10*3/uL (ref 4.0–10.5)

## 2015-05-18 MED ORDER — POLYETHYLENE GLYCOL 3350 17 G PO PACK
17.0000 g | PACK | Freq: Two times a day (BID) | ORAL | Status: DC
  Administered 2015-05-19: 17 g via ORAL
  Filled 2015-05-18 (×3): qty 1

## 2015-05-18 NOTE — Progress Notes (Signed)
Patient seen by covering weekend social worker. Patient has [employer] insurance.  At this time [facility]. Patient's insurance will pay for SNF as long as patient is able to participate, having a three day qualifying stay, and up to 20 days, then the insurance will be 80% insurance, and 20% patient.  Due to weekend, LCSW was unable to reach nursing home to verify any other outliers or barriers for insurance to pay. Will have weekday CSW follow up as needed and if patient has other questions regarding insurance.  Patient needs to be encouraged to participate to  Receive max benefit at SNF and PT.  Deretha EmoryHannah Bradshaw Minihan LCSW, MSW Clinical Social Work: Emergency Room [phone]

## 2015-05-18 NOTE — Progress Notes (Signed)
[facility] Progress Note   [name]:[id number] DOB: 1951/07/24 DOA: 05/04/2015 PCP: Jeanann Lewandowsky, MD  Brief narrative: [name] is a 64 y.o. male with history of chronic respiratory failure on home O2 at 3 L nasal cannula, CAD, chronic pain on chronic pain medications history of coronary artery disease with grade 1 diastolic dysfunction was initially admitted on 05/04/2015 with acute on chronic respiratory failure felt to be secondary to acute COPD exacerbation and decompensated diastolic heart failure.   He was was slowly improving however on the morning of 05/12/2015 patient deteriorated and went into an acute respiratory failure with hypercarbia and hypoxia and was subsequently transferred to the stepdown unit/ICU and placed on BiPAP. Chest x-ray obtained showed a new right upper lobe pneumonia. Antibiotic coverage broadened for HCAP.   Patient also noted to have received multiple narcotics including Dilantin, Percocet, oxycodone, tramadol which may have decrease his respiratory drive leading to his respiratory distress.   Subjective: Still with back pain. He might want to go home.    Assessment/Plan:    Acute on chronic respiratory failure - Initially due to COPD exacerbation and subsequently due HCAP /possible aspiration pneumonia  Active Problems: COPD exacerbation- chronic respiratory failure secondary to severe COPD -Requires 3 L of oxygen at baseline-he is at his baseline O2 requirements at this time -continue with prednisone.  -He is on chronic prednisone at home which he states is 30 mg a day -Continue Pulmicort, Brovana and when necessary albuterol  HCAP/ right upper lobe abscess -Right upper lobe infiltrate noted when patient developed acute hypoxic rest for a failure on 5/23 -I am highly suspicious that this is related to aspiration -5/26 Chest x-ray shows abscess with a suspicion for bronchopleural fistula - CT of chest- reveals right upper  lobe abscess-pulmonary team consulted CT surgery-per CT surgery, he is not a candidate for surgery due to severe COPD-at the most, a drain could be placed if the abscess was not resolving -Appreciate Dr Delford Field assistance. Vancomycin discontinue. Continue with cefepime. Transition to Levaquin next week for 2 weeks.  -Pulmonary following  Chronic pain -X-ray of the thoracic spine obtained yesterday reveals multiple thoracic and upper lumbar vertebral compression deformities -Acording to the patient, he was not a candidate for vertebral plasty when evaluated at [facility] therefore at this time pain management is the only option for him. -However due to above-mentioned episode of respiratory distressed and aspiration, we are trying to limit his narcotics use-he was not on narcotics as outpatient  -will discontinue Toradol due to hemoptysis.  -He will need an outpatient referral to pain management-his primary care physician appears to be the [facility] where, unfortunately narcotics are not prescribed - now on tramadol.  -continue with  fentanyl patch and try to decrease IV fentanyl.   Acute diastolic heart failure- history of pedal edema -Resolved -Continue lisinopril and lasix.  -2-D echo reveals a normal EF with only grade 1 diastolic dysfunction  Anemia of acute inflammation - vitamin B-12 and folic acid deficiency -Anemia panel performed on 5/20 reveals adequate iron  -Folic acid level slightly low at 5.0-will start replacing -Vitamin B 12 level low at 166 -recieved one dose Subc. Now on oral supplement -Hemoglobin persistently less than 8- baseline appeared to be 8-9 -Transfused one unit of packed red blood cells on 5/26-11 improved to 8.3 -hb stable.   Ventricular tachycardia -Noted to have a few short runs on 5/24-Suspected to be secondary to being off  of beta blocker-metoprolol has been resumed-continue to follow or recurrence  Generalized weakness -  Physical therapy recommending SNF versus 24-hour supervision at home-will be going to skilled nursing facility when he is stable  Morbid obesity   Code Status: Full code Family Communication:  Disposition Plan: SNF DVT prophylaxis:  Lovenox Consultants: Pulmonary Procedures:  2-D echo 5/18- EF 60-65%-grade 1 diastolic dysfunction  Antibiotics: Anti-infectives    Start     Dose/Rate Route Frequency Ordered Stop   05/17/15 1300  ceFEPIme (MAXIPIME) 2 g in dextrose 5 % 50 mL IVPB     2 g 100 mL/hr over 30 Minutes Intravenous Every 12 hours 05/17/15 1214     05/17/15 0930  piperacillin-tazobactam (ZOSYN) IVPB 3.375 g  Status:  Discontinued     3.375 g 12.5 mL/hr over 240 Minutes Intravenous 3 times per day 05/17/15 0922 05/17/15 1214   05/15/15 0800  vancomycin (VANCOCIN) 1,250 mg in sodium chloride 0.9 % 250 mL IVPB  Status:  Discontinued     1,250 mg 166.7 mL/hr over 90 Minutes Intravenous Every 12 hours 05/14/15 1737 05/17/15 1208   05/12/15 1600  vancomycin (VANCOCIN) IVPB 1000 mg/200 mL premix  Status:  Discontinued     1,000 mg 200 mL/hr over 60 Minutes Intravenous 3 times per day 05/12/15 1437 05/14/15 1737   05/12/15 0700  ceFEPIme (MAXIPIME) 1 g in dextrose 5 % 50 mL IVPB  Status:  Discontinued     1 g 100 mL/hr over 30 Minutes Intravenous 3 times per day 05/12/15 0557 05/17/15 0854   05/12/15 0700  vancomycin (VANCOCIN) IVPB 1000 mg/200 mL premix  Status:  Discontinued     1,000 mg 200 mL/hr over 60 Minutes Intravenous Every 8 hours 05/12/15 0603 05/12/15 0616   05/12/15 0630  vancomycin (VANCOCIN) IVPB 1000 mg/200 mL premix  Status:  Discontinued     1,000 mg 200 mL/hr over 60 Minutes Intravenous 3 times per day 05/12/15 0616 05/12/15 1437   05/09/15 1000  azithromycin (ZITHROMAX) tablet 500 mg  Status:  Discontinued     500 mg Oral Daily 05/08/15 1307 05/09/15 0840   05/09/15 1000  levofloxacin (LEVAQUIN) tablet 500 mg  Status:  Discontinued     500 mg Oral Daily  05/09/15 0840 05/12/15 0557   05/06/15 1300  cefTRIAXone (ROCEPHIN) 1 g in dextrose 5 % 50 mL IVPB - Premix  Status:  Discontinued     1 g 100 mL/hr over 30 Minutes Intravenous Every 24 hours 05/06/15 1204 05/09/15 0840   05/06/15 1300  azithromycin (ZITHROMAX) 500 mg in dextrose 5 % 250 mL IVPB  Status:  Discontinued     500 mg 250 mL/hr over 60 Minutes Intravenous Every 24 hours 05/06/15 1204 05/08/15 1307      Objective: Filed Weights   05/16/15 0440 05/17/15 0541 05/18/15 0456  Weight: 85.049 kg (187 lb 8 oz) 86.501 kg (190 lb 11.2 oz) 86.8 kg (191 lb 5.8 oz)    Intake/Output Summary (Last 24 hours) at 05/18/15 1221 Last data filed at 05/18/15 1040  Gross per 24 hour  Intake    120 ml  Output    910 ml  Net   -790 ml     Vitals Filed Vitals:   05/18/15 0458 05/18/15 0507 05/18/15 0603 05/18/15 0735  BP:  142/78 132/69   Pulse:  79 82   Temp:      TempSrc:      Resp:  14 13   Height:  Weight:      SpO2: 99% 100% 96% 98%    Exam:  General:  Pt is alert, not in acute distress  HEENT: No icterus, No thrush  Cardiovascular: regular rate and rhythm, S1/S2 No murmur  Respiratory: Extremely poor air entry bilaterally-  Abdomen: Soft, +Bowel sounds, non tender, non distended, no guarding  MSK: No LE edema, cyanosis or clubbing  Data Reviewed: Basic Metabolic Panel:  Recent Labs Lab 05/13/15 0528 05/14/15 0555 05/15/15 0426 05/16/15 0528 05/18/15 0646  NA 136 135 136 135 133*  K 3.8 4.1 3.9 3.6 3.9  CL 88* 89* 90* 89* 90*  CO2 37* 36* 38* 39* 37*  GLUCOSE 200* 250* 110* 159* 88  BUN 12 13 13 9 8   CREATININE 0.42* 0.44* 0.51* 0.44* 0.44*  CALCIUM 8.3* 8.1* 8.1* 7.9* 8.3*  MG 2.2 2.2  --   --   --    Liver Function Tests: No results for input(s): AST, ALT, ALKPHOS, BILITOT, PROT, ALBUMIN in the last 168 hours. No results for input(s): LIPASE, AMYLASE in the last 168 hours. No results for input(s): AMMONIA in the last 168 hours. CBC:  Recent  Labs Lab 05/13/15 0528 05/14/15 0555 05/15/15 0426 05/15/15 2220 05/16/15 0528 05/18/15 0646  WBC 5.2 6.4 6.5 8.8 7.3 7.4  NEUTROABS 4.9  --   --   --   --   --   HGB 7.7* 7.8* 7.6* 8.1* 8.3* 8.8*  HCT 25.1* 25.5* 24.9* 25.9* 26.5* 28.2*  MCV 90.6 90.4 91.2 88.4 88.3 87.3  PLT 188 184 166 161 166 188   Cardiac Enzymes:  Recent Labs Lab 05/12/15 1025 05/12/15 1625 05/12/15 2225  TROPONINI 0.03 <0.03 <0.03   BNP (last 3 results)  Recent Labs  05/04/15 2318  BNP 136.0*    ProBNP (last 3 results)  Recent Labs  10/27/14 2311  PROBNP 89.0    CBG:  Recent Labs Lab 05/17/15 1119 05/17/15 1705 05/17/15 2117 05/18/15 0724 05/18/15 1117  GLUCAP 159* 175* 207* 94 148*    Recent Results (from the past 240 hour(s))  Culture, blood (routine x 2) Call MD if unable to obtain prior to antibiotics being given     Status: None   Collection Time: 05/12/15  7:04 AM  Result Value Ref Range Status   Specimen Description BLOOD RIGHT ARM  Final   Special Requests BOTTLES DRAWN AEROBIC ONLY 5CC  Final   Culture   Final    NO GROWTH 5 DAYS Note: Culture results may be compromised due to an inadequate volume of blood received in culture bottles. Performed at [facility]    Report Status 05/18/2015 FINAL  Final  Culture, blood (routine x 2) Call MD if unable to obtain prior to antibiotics being given     Status: None   Collection Time: 05/12/15  7:14 AM  Result Value Ref Range Status   Specimen Description BLOOD RIGHT HAND  Final   Special Requests BOTTLES DRAWN AEROBIC ONLY 1CC  Final   Culture   Final    NO GROWTH 5 DAYS Performed at [facility]    Report Status 05/18/2015 FINAL  Final  MRSA PCR Screening     Status: Abnormal   Collection Time: 05/12/15  2:10 PM  Result Value Ref Range Status   MRSA by PCR POSITIVE (A) NEGATIVE Final    Comment:        The GeneXpert MRSA Assay (FDA approved for NASAL specimens only), is one component of  a comprehensive MRSA colonization surveillance program. It is not intended to diagnose MRSA infection nor to guide or monitor treatment for MRSA infections. RESULT CALLED TO, READ BACK BY AND VERIFIED WITH: Liz BeachP. STRAMOSKI RN 15:55 05/12/15 ([email or web address])   Culture, sputum-assessment     Status: None   Collection Time: 05/17/15  5:10 AM  Result Value Ref Range Status   Specimen Description SPUTUM  Final   Special Requests NONE  Final   Sputum evaluation   Final    THIS SPECIMEN IS ACCEPTABLE. RESPIRATORY CULTURE REPORT TO FOLLOW.   Report Status 05/17/2015 FINAL  Final  Culture, respiratory (NON-Expectorated)     Status: None (Preliminary result)   Collection Time: 05/17/15  5:10 AM  Result Value Ref Range Status   Specimen Description SPUTUM  Final   Special Requests NONE  Final   Gram Stain PENDING  Incomplete   Culture   Final    MODERATE STAPHYLOCOCCUS AUREUS Note: RIFAMPIN AND GENTAMICIN SHOULD NOT BE USED AS SINGLE DRUGS FOR TREATMENT OF STAPH INFECTIONS. [facility]    Report Status PENDING  Incomplete     Studies: No results found.  Scheduled Meds:  Scheduled Meds: . albuterol  2.5 mg Nebulization Q4H  . antiseptic oral rinse  7 mL Mouth Rinse BID  . arformoterol  15 mcg Nebulization BID  . aspirin  325 mg Oral q morning - 10a  . benzonatate  200 mg Oral TID  . budesonide (PULMICORT) nebulizer solution  0.5 mg Nebulization Q12H  . ceFEPime (MAXIPIME) IV  2 g Intravenous Q12H  . clopidogrel  75 mg Oral Daily  . enoxaparin (LOVENOX) injection  40 mg Subcutaneous Q24H  . fentaNYL  12.5 mcg Transdermal Q72H  . folic acid  1 mg Oral Daily  . furosemide  40 mg Oral Daily  . guaiFENesin  10 mL Oral 4 times per day  . insulin aspart  0-20 Units Subcutaneous TID WC  . insulin glargine  10 Units Subcutaneous Daily  . metoprolol  50 mg Oral BID  . nitroGLYCERIN  0.5 inch Topical 4 times per day  . nystatin  5 mL Oral QID  . pantoprazole  40 mg Oral  BID  . polyethylene glycol  17 g Oral Once  . polyethylene glycol  17 g Oral BID  . potassium chloride SA  40 mEq Oral Daily  . predniSONE  50 mg Oral Q breakfast  . senna-docusate  2 tablet Oral BID  . sodium chloride  750 mL Intravenous Once  . sodium chloride  3 mL Intravenous Q12H  . vitamin B-12  1,000 mcg Oral Daily   Continuous Infusions: . sodium chloride Stopped (05/13/15 1200)  . sodium chloride Stopped (05/13/15 1200)    Time spent on care of this patient: 25 minutes   Rivkah Wolz A, MD 05/18/2015, 12:21 PM  LOS: 13 days  [phone] [facility] Office  [phone] Pager - Text Page per [email or web address]  If 7PM-7AM, please contact night-coverage [email or web address]

## 2015-05-19 DIAGNOSIS — J15212 Pneumonia due to Methicillin resistant Staphylococcus aureus: Secondary | ICD-10-CM | POA: Diagnosis present

## 2015-05-19 LAB — CULTURE, RESPIRATORY W GRAM STAIN

## 2015-05-19 LAB — GLUCOSE, CAPILLARY
GLUCOSE-CAPILLARY: 54 mg/dL — AB (ref 65–99)
GLUCOSE-CAPILLARY: 92 mg/dL (ref 65–99)
Glucose-Capillary: 85 mg/dL (ref 65–99)
Glucose-Capillary: 272 mg/dL — ABNORMAL HIGH (ref 65–99)
Glucose-Capillary: 58 mg/dL — ABNORMAL LOW (ref 65–99)

## 2015-05-19 LAB — CULTURE, RESPIRATORY

## 2015-05-19 MED ORDER — SODIUM CHLORIDE 0.9 % IV SOLN: 1250.0000 mg | Freq: Two times a day (BID) | INTRAVENOUS | Status: DC

## 2015-05-19 MED ORDER — ACETAMINOPHEN 325 MG PO TABS: 650.0000 mg | ORAL_TABLET | Freq: Four times a day (QID) | ORAL | Status: AC | PRN

## 2015-05-19 MED ORDER — PREDNISONE 10 MG PO TABS: ORAL_TABLET | ORAL | Status: AC

## 2015-05-19 MED ORDER — FENTANYL 12 MCG/HR TD PT72: 12.5000 ug | MEDICATED_PATCH | TRANSDERMAL | Status: DC

## 2015-05-19 MED ORDER — NITROGLYCERIN 2 % TD OINT: 0.5000 [in_us] | TOPICAL_OINTMENT | Freq: Four times a day (QID) | TRANSDERMAL | Status: DC

## 2015-05-19 MED ORDER — HEPARIN SOD (PORK) LOCK FLUSH 100 UNIT/ML IV SOLN
250.0000 [IU] | Freq: Every day | INTRAVENOUS | Status: DC
  Administered 2015-05-19: 250 [IU]

## 2015-05-19 MED ORDER — HEPARIN SOD (PORK) LOCK FLUSH 100 UNIT/ML IV SOLN
250.0000 [IU] | INTRAVENOUS | Status: DC | PRN
  Administered 2015-05-19 (×2): 250 [IU]
  Filled 2015-05-19 (×2): qty 5

## 2015-05-19 MED ORDER — TRAMADOL HCL 50 MG PO TABS: 100.0000 mg | ORAL_TABLET | Freq: Four times a day (QID) | ORAL | Status: DC | PRN

## 2015-05-19 MED ORDER — BENZONATATE 200 MG PO CAPS: 200.0000 mg | ORAL_CAPSULE | Freq: Three times a day (TID) | ORAL | Status: DC

## 2015-05-19 MED ORDER — FENTANYL CITRATE (PF) 100 MCG/2ML IJ SOLN
12.5000 ug | Freq: Four times a day (QID) | INTRAMUSCULAR | Status: DC | PRN
Start: 2015-05-19 — End: 2015-05-19
  Administered 2015-05-19 (×2): 12.5 ug via INTRAVENOUS
  Filled 2015-05-19: qty 2

## 2015-05-19 MED ORDER — ALBUTEROL SULFATE HFA 108 (90 BASE) MCG/ACT IN AERS: 2.0000 | INHALATION_SPRAY | RESPIRATORY_TRACT | Status: AC | PRN

## 2015-05-19 MED ORDER — INSULIN GLARGINE 100 UNIT/ML ~~LOC~~ SOLN: 10.0000 [IU] | Freq: Every day | SUBCUTANEOUS | Status: DC

## 2015-05-19 MED ORDER — VANCOMYCIN HCL 10 G IV SOLR
1250.0000 mg | Freq: Two times a day (BID) | INTRAVENOUS | Status: DC
  Administered 2015-05-19: 1250 mg via INTRAVENOUS
  Filled 2015-05-19 (×3): qty 1250

## 2015-05-19 MED ORDER — FUROSEMIDE 40 MG PO TABS: 40.0000 mg | ORAL_TABLET | Freq: Every day | ORAL | Status: DC

## 2015-05-19 MED ORDER — GUAIFENESIN 100 MG/5ML PO SOLN: 10.0000 mL | Freq: Four times a day (QID) | ORAL | Status: DC

## 2015-05-19 MED ORDER — PANTOPRAZOLE SODIUM 40 MG PO TBEC: 40.0000 mg | DELAYED_RELEASE_TABLET | Freq: Two times a day (BID) | ORAL | Status: AC

## 2015-05-19 MED ORDER — BUDESONIDE 0.5 MG/2ML IN SUSP: 0.5000 mg | Freq: Two times a day (BID) | RESPIRATORY_TRACT | Status: DC

## 2015-05-19 NOTE — Discharge Summary (Addendum)
Physician Discharge Summary  [name] WUJ:[id number] DOB: [birth year] DOA: 05/04/2015  PCP: Jeanann Lewandowsky, MD  Admit date: 05/04/2015 Discharge date: 05/19/2015  Time spent: 35 minutes  Recommendations for Outpatient Follow-up:  Patient needs IV vancomycin for 1 week then Please Start Bactrim and continue Bactrim for 10 days. patient needs to follow with Pulmonary.  Careful with pain medications and oversedation.   Discharge Diagnoses:    Lung abscess RUL   Essential hypertension   Coronary atherosclerosis   Acute on chronic respiratory failure with hypoxia   Bilateral lower extremity edema   COPD exacerbation   Diabetes   Acute on chronic diastolic CHF (congestive heart failure)   Weakness of both legs   History of CHF (congestive heart failure)   Other chest pain   DM (diabetes mellitus) type II uncontrolled, periph vascular disorder   Acute on chronic respiratory failure   HCAP (healthcare-associated pneumonia)   Esophageal reflux   Necrotizing pneumonia   Hypoxemia   Pressure ulcer   Discharge Condition: Stable.   Diet recommendation: Heart Healthy  Filed Weights   05/17/15 0541 05/18/15 0456 05/19/15 0454  Weight: 86.501 kg (190 lb 11.2 oz) 86.8 kg (191 lb 5.8 oz) 83.28 kg (183 lb 9.6 oz)    History of present illness:  [name]. is a 64 y.o. male with history of CAD, O2 dependent COPD, Diastolic CHF with EF 55% on last 2D ECHO on 10/2014, HTN, DM2 who resents to the ED with complaints of worsening SOB chest tightness and wheezing over the past 24 hours. He denies any fevers or chills. He does reports having chest pian and pain radiating into both of his shoulders. He was treated with Duonebs, and IV Solumedrol x 1 along with 1 dose of IV Lasix and had mild improvement and was referred for medical admission.   Hospital Course:  [name] is a 64 y.o. male with history of chronic respiratory failure on home O2 at 3 L nasal cannula, CAD,  chronic pain on chronic pain medications history of coronary artery disease with grade 1 diastolic dysfunction was initially admitted on 05/04/2015 with acute on chronic respiratory failure felt to be secondary to acute COPD exacerbation and decompensated diastolic heart failure.   He was was slowly improving however on the morning of 05/12/2015 patient deteriorated and went into an acute respiratory failure with hypercarbia and hypoxia and was subsequently transferred to the stepdown unit/ICU and placed on BiPAP. Chest x-ray obtained showed a new right upper lobe pneumonia. Antibiotic coverage broadened for HCAP.   Patient also noted to have received multiple narcotics including Dilantin, Percocet, oxycodone, tramadol which may have decrease his respiratory drive leading to his respiratory distress.    Assessment/Plan:   Acute on chronic respiratory failure - Initially due to COPD exacerbation and subsequently due HCAP /possible aspiration pneumonia  Active Problems: COPD exacerbation- chronic respiratory failure secondary to severe COPD -Requires 3 L of oxygen at baseline-he is at his baseline O2 requirements at this time -continue with prednisone.  -He is on chronic prednisone at home which he states is 30 mg a day. Prednisone 50 mg daily for 3 days then resume home dose.  -Continue albuterol, prednisone, dulera.   HCAP/ right upper lobe abscess -Right upper lobe infiltrate noted when patient developed acute hypoxic rest for a failure on 5/23 -I am highly suspicious that this is related to aspiration -5/26 Chest x-ray shows abscess with a suspicion for bronchopleural fistula - CT of  chest- reveals right upper lobe abscess-pulmonary team consulted CT surgery-per CT surgery, he is not a candidate for surgery due to severe COPD-at the most, a drain could be placed if the abscess was not resolving -Appreciate Dr Delford FieldWright assistance. -Pulmonary following -sputum culture grew MRSA, sensitive  to vancomycin and Bactrim.  -Discussed with Dr Delford FieldWright , patient will be discharge on IV vancomycin for 1 week, then start Bactrim for 10 days . Patient will need to follow up with pulmonary in 10 days.   Chronic pain -X-ray of the thoracic spine obtained yesterday reveals multiple thoracic and upper lumbar vertebral compression deformities -Acording to the patient, he was not a candidate for vertebral plasty when evaluated at [facility] therefore at this time pain management is the only option for him. -However due to above-mentioned episode of respiratory distressed and aspiration, we are trying to limit his narcotics use-he was not on narcotics as outpatient  -will discontinue Toradol due to hemoptysis.  -He will need an outpatient referral to pain management-his primary care physician appears to be [facility] where, unfortunately narcotics are not prescribed - now on tramadol.  -continue with fentanyl patch.   Acute diastolic heart failure- history of pedal edema -Resolved -Continue lisinopril and lasix.  -2-D echo reveals a normal EF with only grade 1 diastolic dysfunction  Anemia of acute inflammation - vitamin B-12 and folic acid deficiency -Anemia panel performed on 5/20 reveals adequate iron  -Folic acid level slightly low at 5.0-will start replacing -Vitamin B 12 level low at 166 -recieved one dose Subc. Now on oral supplement -Hemoglobin persistently less than 8- baseline appeared to be 8-9 -Transfused one unit of packed red blood cells on 5/26-11 improved to 8.3 -hb stable.   Ventricular tachycardia -Noted to have a few short runs on 5/24-Suspected to be secondary to being off of beta blocker-metoprolol has been resumed-continue to follow or recurrence  Generalized weakness - Physical therapy recommending SNF versus 24-hour supervision at home-will be going to skilled nursing facility when he is stable  Morbid  obesity  Procedures:  none  Consultations:  Pulmonary  CVTS  Discharge Exam: Filed Vitals:   05/19/15 0635  BP: 136/70  Pulse: 70  Temp:   Resp: 15    General: Alert in no distress  Cardiovascular: S 1, S 2 RRR Respiratory: few crackles.   Discharge Instructions   Discharge Instructions    Diet - low sodium heart healthy    Complete by:  As directed      Increase activity slowly    Complete by:  As directed           Current Discharge Medication List    START taking these medications   Details  acetaminophen (TYLENOL) 325 MG tablet Take 2 tablets (650 mg total) by mouth every 6 (six) hours as needed for mild pain (or Fever >/= 101). Qty: 30 tablet, Refills: 0    benzonatate (TESSALON) 200 MG capsule Take 1 capsule (200 mg total) by mouth 3 (three) times daily. Qty: 20 capsule, Refills: 0    fentaNYL (DURAGESIC - DOSED MCG/HR) 12 MCG/HR Place 1 patch (12.5 mcg total) onto the skin every 3 (three) days. Qty: 5 patch, Refills: 0    guaiFENesin (ROBITUSSIN) 100 MG/5ML SOLN Take 10 mLs (200 mg total) by mouth every 6 (six) hours. Qty: 1200 mL, Refills: 0    insulin glargine (LANTUS) 100 UNIT/ML injection Inject 0.1 mLs (10 Units total) into the skin  daily. Qty: 10 mL, Refills: 11    nitroGLYCERIN (NITROGLYN) 2 % ointment Apply 0.5 inches topically every 6 (six) hours. Qty: 30 g, Refills: 0    vancomycin 1,250 mg in sodium chloride 0.9 % 250 mL Inject 1,250 mg into the vein every 12 (twelve) hours. Qty: 8 g, Refills: 0      CONTINUE these medications which have CHANGED   Details  albuterol (PROVENTIL HFA;VENTOLIN HFA) 108 (90 BASE) MCG/ACT inhaler Inhale 2 puffs into the lungs every 4 (four) hours as needed for wheezing or shortness of breath. Qty: 1 Inhaler, Refills: 11    furosemide (LASIX) 40 MG tablet Take 1 tablet (40 mg total) by mouth daily. Qty: 30 tablet, Refills: 0    pantoprazole (PROTONIX) 40 MG tablet Take 1 tablet (40 mg total) by mouth 2  (two) times daily. Qty: 30 tablet, Refills: 3    predniSONE (DELTASONE) 10 MG tablet Take 5 tablets daily for 3 days then 3 tablet daily Qty: 30 tablet, Refills: 0    traMADol (ULTRAM) 50 MG tablet Take 2 tablets (100 mg total) by mouth every 6 (six) hours as needed for moderate pain. Qty: 30 tablet, Refills: 0      CONTINUE these medications which have NOT CHANGED   Details  aspirin 325 MG tablet Take 1 tablet (325 mg total) by mouth every morning. Qty: 30 tablet, Refills: 3    clopidogrel (PLAVIX) 75 MG tablet Take 1 tablet (75 mg total) by mouth daily. Qty: 31 tablet, Refills: 3    fluticasone (FLONASE) 50 MCG/ACT nasal spray Place 2 sprays into both nostrils 2 (two) times daily. Qty: 16 g, Refills: 2    metFORMIN (GLUCOPHAGE) 500 MG tablet Take 1 tablet (500 mg total) by mouth 2 (two) times daily with a meal. Qty: 180 tablet, Refills: 3    metoprolol (LOPRESSOR) 50 MG tablet Take 1 tablet (50 mg total) by mouth 2 (two) times daily. Qty: 60 tablet, Refills: 5    potassium chloride SA (K-DUR,KLOR-CON) 20 MEQ tablet Take 1 tablet (20 mEq total) by mouth daily. Qty: 30 tablet, Refills: 3    albuterol (PROVENTIL) (2.5 MG/3ML) 0.083% nebulizer solution Take 3 mLs (2.5 mg total) by nebulization every 6 (six) hours as needed for wheezing or shortness of breath. Dx 496 Qty: 360 mL, Refills: 11    ipratropium (ATROVENT) 0.02 % nebulizer solution Take 2.5 mLs (0.5 mg total) by nebulization every 4 (four) hours as needed for wheezing or shortness of breath. Qty: 300 mL, Refills: 5    mometasone-formoterol (DULERA) 100-5 MCG/ACT AERO Inhale 2 puffs into the lungs 2 (two) times daily. Qty: 3 Inhaler, Refills: 3    nystatin (MYCOSTATIN) 100000 UNIT/ML suspension Take 5 mLs (500,000 Units total) by mouth 4 (four) times daily. Qty: 60 mL, Refills: 0      STOP taking these medications     ipratropium-albuterol (DUONEB) 0.5-2.5 (3) MG/3ML SOLN      terbutaline (BRETHINE) 2.5 MG tablet       arformoterol (BROVANA) 15 MCG/2ML NEBU        Allergies  Allergen Reactions  . Lipitor [Atorvastatin Calcium] Other (See Comments)    Muscle aches   Follow-up Information    Follow up with [facility].   Why:  home health physical and occupationalt therapy, nurse and aide    Contact information:   [address] [place] [address] [phone]       Follow up with Leslye Peer., MD In  2 weeks.   Specialty:  Pulmonary Disease   Contact information:   [address] [place] [address] [phone]        The results of significant diagnostics from this hospitalization (including imaging, microbiology, ancillary and laboratory) are listed below for reference.    Significant Diagnostic Studies: Dg Chest 2 View  05/15/2015   CLINICAL DATA:  Pneumonia and right upper lobe abscess.  EXAM: CHEST - 2 VIEW  COMPARISON:  05/14/2015  FINDINGS: The anterior right upper lobe abscess containing an air-fluid level likely has enlarged with maximum diameter approaching 10 cm in the lateral projection. There is more fluid in the abscess with air-fluid level visualized dependently. This abscess developed quite quickly and was not present on 05/04/2015. Followup CT of the chest would be helpful for further characterization, as there may be a component of bronchopleural fistula or overt communication with a bronchus. No pneumothorax is identified.  No pulmonary edema or significant pleural fluid identified. The heart size and mediastinal contours remain normal.  IMPRESSION: Enlarging right upper lobe pulmonary abscess. Followup CT of the chest would be helpful for further characterization, as there may be an overt communication with a bronchus or component of bronchopleural fistula.   Electronically Signed   By: Irish Lack M.D.   On: 05/15/2015 08:09   Dg Chest 2 View  05/14/2015   CLINICAL DATA:  Chest pain and cough.  EXAM: CHEST  2 VIEW  COMPARISON:   05/12/2015.  05/04/2015.  CT 03/20/2015 .  FINDINGS: Left PICC line stable position. Mediastinum hilar structures are normal. Cavitary lesion containing a small amount of fluid is noted in the right upper lobe. This could represent fluid within a bullae secondary to infection. Developing abscess cannot be excluded. No pleural effusion or pneumothorax. Stable cardiomegaly with normal pulmonary vascularity. Diffuse multilevel degenerative change with multiple compression fractures again noted.  IMPRESSION: 1.  Left PICC line stable position.  2. Right upper lobe bullae containing fluid, possibly secondary to infection. Developing pulmonary abscess cannot be excluded .   Electronically Signed   By: Maisie Fus  Register   On: 05/14/2015 08:24   Dg Thoracic Spine 2 View  05/15/2015   CLINICAL DATA:  Diffuse back pain following a recent fall. History of chronic mid back pain.  EXAM: THORACIC SPINE - 2 VIEW  COMPARISON:  Chest CT obtained earlier today.  FINDINGS: Previously noted multiple chronic thoracic and upper lumbar vertebral compression deformities. No acute fractures or subluxations seen. One of the lower thoracic vertebral compression deformities has a linear vacuum phenomenon beneath the superior endplate, unchanged from the CT earlier. Left PICC tip in the inferior aspect of the superior vena cava. Thoracic and upper lumbar spine degenerative changes. The previously noted right upper lobe cavitary lesion is again demonstrated.  IMPRESSION: 1. Stable multiple thoracic and upper lumbar vertebral compression deformities compared to the CT earlier today. These were also previously compared to previous radiographs and shown to be stable. 2. Left PICC tip in the inferior aspect of the superior vena cava. If a position at the cavoatrial junction is desired, it would be recommended that this be advanced 2 cm. 3. Stable right upper lobe cavitary lesion.   Electronically Signed   By: Beckie Salts M.D.   On: 05/15/2015  21:41   Ct Chest W Contrast  05/15/2015   CLINICAL DATA:  Abnormal chest radiograph, right upper lobe pneumonia  EXAM: CT CHEST WITH CONTRAST  TECHNIQUE: Multidetector CT imaging of the chest  was performed during intravenous contrast administration.  CONTRAST:  75mL OMNIPAQUE IOHEXOL 300 MG/ML  SOLN  COMPARISON:  Chest radiograph dated 05/15/2015. CTA chest dated 03/20/2015.  FINDINGS: Mediastinum/Nodes: Heart is normal in size. No pericardial effusion.  Coronary atherosclerosis. Postsurgical changes related to prior CABG.  Small mediastinal lymph nodes, including a 9 mm short axis right paratracheal node (series 2/ image 26), likely reactive.  Visualized thyroid is unremarkable.  Lungs/Pleura: 6.7 x 8.5 x 7.7 cm mildly thick-walled cavitary lesion in the anterior/medial right upper lobe (series 3/image 23 with associated air-fluid level and surrounding ground-glass opacity/inflammatory changes. Lesion was not present on prior CT. These findings are most suggestive of pulmonary abscess.  Underlying mild emphysematous changes.  No pleural effusion or pneumothorax.  Upper abdomen: Visualized upper abdomen is notable for vascular calcifications.  Musculoskeletal: Degenerative changes of the visualized thoracolumbar spine.  Multiple mild to moderate thoracic compression fracture deformities involving the mid to lower thoracic spine, chronic.  IMPRESSION: 8.5 cm cavitary lesion with air-fluid level and surrounding inflammatory changes in the medial right upper lobe, new from prior CT, compatible with pulmonary abscess.  9 mm short axis right paratracheal node, likely reactive.  Consider follow-up CT chest in 4-6 weeks to document improvement/resolution.   Electronically Signed   By: Charline Bills M.D.   On: 05/15/2015 13:56   Dg Chest Port 1 View  05/16/2015   CLINICAL DATA:  Shortness of breath, respiratory failure  EXAM: PORTABLE CHEST - 1 VIEW  COMPARISON:  05/15/2015  FINDINGS: Medial right upper lobe large  cavitary mass again evident consistent with a pulmonary abscess when compared to yesterday's chest CT. This measures approximate 8.8 x 8.3 cm by plain radiography. Left lung remains clear. No developing effusion. Negative for pneumothorax. Trachea midline. Normal heart size and vascularity. Left PICC line tip mid SVC level.  IMPRESSION: Medial right upper lobe large cavitary mass compatible with a pulmonary abscess. No new finding by plain radiography.   Electronically Signed   By: Judie Petit.  Shick M.D.   On: 05/16/2015 07:56   Dg Chest Port 1 View  05/12/2015   CLINICAL DATA:  Dyspnea  EXAM: PORTABLE CHEST - 1 VIEW  COMPARISON:  05/04/2015  FINDINGS: New ill-defined airspace disease in the right upper lung.  Background of hyperinflation and emphysematous change. No edema, effusion, or pneumothorax.  Normal heart size and mediastinal contours.  IMPRESSION: 1. Right upper lobe pneumonia, new from 05/04/2015. 2. Emphysema.   Electronically Signed   By: Marnee Spring M.D.   On: 05/12/2015 03:14   Dg Chest Portable 1 View  05/05/2015   CLINICAL DATA:  Chest pain and shortness of breath.  EXAM: PORTABLE CHEST - 1 VIEW  COMPARISON:  04/21/2015  FINDINGS: Lungs remain hyperinflated. The cardiomediastinal contours are unchanged, heart at the upper limits of normal. Bibasilar atelectasis or scarring is again seen. Pulmonary vasculature is normal. No consolidation, pleural effusion, or pneumothorax. No acute osseous abnormalities are seen.  IMPRESSION: Unchanged hyperinflation without acute pulmonary process.   Electronically Signed   By: Rubye Oaks M.D.   On: 05/05/2015 00:45    Microbiology: Recent Results (from the past 240 hour(s))  Culture, blood (routine x 2) Call MD if unable to obtain prior to antibiotics being given     Status: None   Collection Time: 05/12/15  7:04 AM  Result Value Ref Range Status   Specimen Description BLOOD RIGHT ARM  Final   Special Requests BOTTLES DRAWN AEROBIC ONLY 5CC  Final  Culture   Final    NO GROWTH 5 DAYS Note: Culture results may be compromised due to an inadequate volume of blood received in culture bottles. Performed at [facility]    Report Status 05/18/2015 FINAL  Final  Culture, blood (routine x 2) Call MD if unable to obtain prior to antibiotics being given     Status: None   Collection Time: 05/12/15  7:14 AM  Result Value Ref Range Status   Specimen Description BLOOD RIGHT HAND  Final   Special Requests BOTTLES DRAWN AEROBIC ONLY 1CC  Final   Culture   Final    NO GROWTH 5 DAYS Performed at [facility]    Report Status 05/18/2015 FINAL  Final  MRSA PCR Screening     Status: Abnormal   Collection Time: 05/12/15  2:10 PM  Result Value Ref Range Status   MRSA by PCR POSITIVE (A) NEGATIVE Final    Comment:        The GeneXpert MRSA Assay (FDA approved for NASAL specimens only), is one component of a comprehensive MRSA colonization surveillance program. It is not intended to diagnose MRSA infection nor to guide or monitor treatment for MRSA infections. RESULT CALLED TO, READ BACK BY AND VERIFIED WITH: Liz Beach RN 15:55 05/12/15 ([email or web address])   Culture, sputum-assessment     Status: None   Collection Time: 05/17/15  5:10 AM  Result Value Ref Range Status   Specimen Description SPUTUM  Final   Special Requests NONE  Final   Sputum evaluation   Final    THIS SPECIMEN IS ACCEPTABLE. RESPIRATORY CULTURE REPORT TO FOLLOW.   Report Status 05/17/2015 FINAL  Final  Culture, respiratory (NON-Expectorated)     Status: None   Collection Time: 05/17/15  5:10 AM  Result Value Ref Range Status   Specimen Description SPUTUM  Final   Special Requests NONE  Final   Gram Stain   Final    ABUNDANT WBC PRESENT,BOTH PMN AND MONONUCLEAR RARE SQUAMOUS EPITHELIAL CELLS PRESENT FEW GRAM POSITIVE COCCI IN PAIRS IN CLUSTERS Gram Stain Report Called to,Read Back By and Verified With: Gram Stain Report Called to,Read Back By and Verified  With: [email or web address].O AT 0657 ON [id number] BY WEBSP Performed at [facility]    Culture   Final    MODERATE METHICILLIN RESISTANT STAPHYLOCOCCUS AUREUS Note: RIFAMPIN AND GENTAMICIN SHOULD NOT BE USED AS SINGLE DRUGS FOR TREATMENT OF STAPH INFECTIONS. Performed at [facility]    Report Status 05/19/2015 FINAL  Final   Organism ID, Bacteria METHICILLIN RESISTANT STAPHYLOCOCCUS AUREUS  Final      Susceptibility   Methicillin resistant staphylococcus aureus - MIC*    CLINDAMYCIN >=8 RESISTANT Resistant     ERYTHROMYCIN >=8 RESISTANT Resistant     GENTAMICIN <=0.5 SENSITIVE Sensitive     LEVOFLOXACIN >=8 RESISTANT Resistant     OXACILLIN >=4 RESISTANT Resistant     PENICILLIN >=0.5 RESISTANT Resistant     RIFAMPIN <=0.5 SENSITIVE Sensitive     TRIMETH/SULFA <=10 SENSITIVE Sensitive     VANCOMYCIN <=0.5 SENSITIVE Sensitive     TETRACYCLINE <=1 SENSITIVE Sensitive     * MODERATE METHICILLIN RESISTANT STAPHYLOCOCCUS AUREUS     Labs: Basic Metabolic Panel:  Recent Labs Lab 05/13/15 0528 05/14/15 0555 05/15/15 0426 05/16/15 0528 05/18/15 0646  NA 136 135 136 135 133*  K 3.8 4.1 3.9 3.6 3.9  CL 88* 89* 90* 89* 90*  CO2 37* 36* 38* 39* 37*  GLUCOSE 200* 250*  110* 159* 88  BUN CREATININE 0.42* 0.44* 0.51* 0.44* 0.44*  CALCIUM 8.3* 8.1* 8.1* 7.9* 8.3*  MG 2.2 2.2  --   --   --    Liver Function Tests: No results for input(s): AST, ALT, ALKPHOS, BILITOT, PROT, ALBUMIN in the last 168 hours. No results for input(s): LIPASE, AMYLASE in the last 168 hours. No results for input(s): AMMONIA in the last 168 hours. CBC:  Recent Labs Lab 05/13/15 0528 05/14/15 0555 05/15/15 0426 05/15/15 2220 05/16/15 0528 05/18/15 0646  WBC 5.2 6.4 6.5 8.8 7.3 7.4  NEUTROABS 4.9  --   --   --   --   --   HGB 7.7* 7.8* 7.6* 8.1* 8.3* 8.8*  HCT 25.1* 25.5* 24.9* 25.9* 26.5* 28.2*  MCV 90.6 90.4 91.2 88.4 88.3 87.3  PLT 188 184 166 161 166 188   Cardiac  Enzymes:  Recent Labs Lab 05/12/15 1625 05/12/15 2225  TROPONINI <0.03 <0.03   BNP: BNP (last 3 results)  Recent Labs  05/04/15 2318  BNP 136.0*    ProBNP (last 3 results)  Recent Labs  10/27/14 2311  PROBNP 89.0    CBG:  Recent Labs Lab 05/18/15 1650 05/18/15 2027 05/18/15 2227 05/19/15 0734 05/19/15 1124  GLUCAP 190* 189* 207* 85 272*       Signed:  Santosh Petter A  [facility] 05/19/2015, 3:23 PM

## 2015-05-19 NOTE — Care Management Note (Signed)
Case Management Note  Patient Details  Name: [name] MRN: [id number] Date of Birth: January 18, 1951  Subjective/Objective: 64 y.o. male with hx of chronic respiratory failure on home O2 at 3 L nasal cannula, CAD, chronic pain on chronic pain medications, and CAD with grade 1 diastolic dysfunction. He was slowly improving however on the morning of 05/12/2015 patient deteriorated and went into an acute respiratory failure with hypercarbia and hypoxia and was subsequently transferred to the stepdown unit/ICU and placed on BiPAP. Chest x-ray obtained showed a new right upper lobe pneumonia.             Action/Plan: Referral received to assist with HHC, meds and home O2.   Expected Discharge Date: 05/19/15                 Expected Discharge Plan:  Skilled Nursing Facility  In-House Referral:  Clinical Social Work  Discharge planning Services  CM Consult  Post Acute Care Choice:  Home Health Choice offered to:  Patient  DME Arranged:    DME Agency:     HH Arranged:  RN, PT, OT, Nurse's Aide [place] Agency:  [facility]  Status of Service:     Medicare Important Message Given:  Yes Date Medicare IM Given:  05/12/15 Medicare IM give by:  debbie dowell rn,bsn Date Additional Medicare IM Given:    Additional Medicare Important Message give by:     If discussed at [place] of Stay Meetings, dates discussed:    Additional Comments: Per SW, D/C plan is SNF. Spoke with pt's nurse who stated that pt wants to go home because he can't afford the co-payment for SNF. She stated that pt requires 24/7 care. He is not able to get OOB by himself and he requires 2 people for assistance. He informed her that his sister can help him. Met with pt. He stated that he he has been in the nursing home before and he already used the 20 days that he doesn't have a co-payment. He can't afford the co-payment. He stated that his sister and other people can help him. He has walkers and a BSC. He is active  with [facility] for O2. Explained to pt that is not safe for him to return home without 24/7 care. Asked pt if I could call his sister. He provided his sister's phone # [name] [phone]). Attempted to contact [name] but no answer. Unknown F[place], SW, and she will be in to talk to pt. Informed Butch Penny that pt is ready to be d/c.  Norina Buzzard, RN 05/19/2015, 12:50 PM

## 2015-05-19 NOTE — Clinical Social Work Placement (Addendum)
   CLINICAL SOCIAL WORK PLACEMENT  NOTE  Date:  05/19/2015  Patient Details  Name: Chris ProphetUlysses G Jasko MRN: [id number] Date of Birth: 04/29/1951  Clinical Social Work is seeking post-discharge placement for this patient at the Skilled  Nursing Facility level of care (*CSW will initial, date and re-position this form in  chart as items are completed):  Yes   Patient/family provided with [place] Clinical Social Work Department's list of facilities offering this level of care within the geographic area requested by the patient (or if unable, by the patient's family).  Yes   Patient/family informed of their freedom to choose among providers that offer the needed level of care, that participate in Medicare, Medicaid or managed care program needed by the patient, have an available bed and are willing to accept the patient.  Yes   Patient/family informed of [place]'s ownership interest in [facility] and [facility], as well as of the fact that they are under no obligation to receive care at these facilities.  PASRR submitted to EDS on       PASRR number received on       Existing PASRR number confirmed on 04/01/14     FL2 transmitted to all facilities in geographic area requested by pt/family on 05/14/15     FL2 transmitted to all facilities within larger geographic area on       Patient informed that his/her managed care company has contracts with or will negotiate with certain facilities, including the following:   (NA)     Yes   Patient/family informed of bed offers received.  [facility]     Physician recommends and patient chooses bed at      [facility].  Patient to be transferred to facility by Ambulance Sharin Mons(PTAR)     Patient family notified on 05/19/15 of transfer.  Name of family member notified:  Sister:  Olin HauserLeslie Andrews     PHYSICIAN Please sign FL2, Please  prepare priority discharge summary, including medications, Please prepare prescriptions     Additional Comment: OK per MD for d/c today to SNF for short term rehab. Patient is extremely worried about owing any co-pays to the SNF- stating that he has "used his medicare days."  Patient reports that he has had 2 SNF placements this year- one for 6 days and another for 4 days.  CSW discussed with Reyne Dumasammy Blakely- RN Liaison- [facility]. She stated that her business office will check medicare days and will assist patient with a Medicaid application. His sister Verlon AuLeslie stated she would gladly help with this. She wants patient to return home but is unable to lift patient or pull on him as she has a Pacemaker.  She wants him to return home with her as soon as possible. Patient agrees to SNF placement as long as he doesn't have to pay anything.  Nursing notified to call report. EMS arranged. No further CSW needs identified. CSW signing off.  Lorri Frederickonna T. Andria RheinCrowder, LCSW [phone]      _______________________________________________ Lovette Clicherowder, Briar Witherspoon T, LCSW 05/19/2015, 6:00 pm

## 2015-05-19 NOTE — Progress Notes (Signed)
ANTIBIOTIC CONSULT NOTE - FOLLOW UP  Pharmacy Consult for Vancomycin and Zosyn Indication: HCAP  Allergies  Allergen Reactions  . Lipitor [Atorvastatin Calcium] Other (See Comments)    Muscle aches    Patient Measurements: Height:  (180.3 cm) Weight: 183 lb 9.6 oz (83.28 kg) IBW/kg (Calculated) : 75.3  Vital Signs: Temp: 97.8 F (36.6 C) (05/30 0454) Temp Source: Oral (05/30 0454) BP: 136/70 mmHg (05/30 0635) Pulse Rate: 70 (05/30 0635) Intake/Output from previous day: 05/29 0701 - 05/30 0700 In: 723 [P.O.:720; I.V.:3] Out: 1900 [Urine:1900] Intake/Output from this shift: Total I/O In: -  Out: 250 [Urine:250]  Labs:  Recent Labs  05/18/15 0646  WBC 7.4  HGB 8.8*  PLT 188  CREATININE 0.44*   Estimated Creatinine Clearance: 98 mL/min (by C-G formula based on Cr of 0.44).  Recent Labs  05/17/15 0900  VANCOTROUGH 19     Microbiology: Recent Results (from the past 720 hour(s))  Culture, blood (routine x 2) Call MD if unable to obtain prior to antibiotics being given     Status: None   Collection Time: 05/12/15  7:04 AM  Result Value Ref Range Status   Specimen Description BLOOD RIGHT ARM  Final   Special Requests BOTTLES DRAWN AEROBIC ONLY 5CC  Final   Culture   Final    NO GROWTH 5 DAYS Note: Culture results may be compromised due to an inadequate volume of blood received in culture bottles. Performed at [facility]    Report Status 05/18/2015 FINAL  Final  Culture, blood (routine x 2) Call MD if unable to obtain prior to antibiotics being given     Status: None   Collection Time: 05/12/15  7:14 AM  Result Value Ref Range Status   Specimen Description BLOOD RIGHT HAND  Final   Special Requests BOTTLES DRAWN AEROBIC ONLY 1CC  Final   Culture   Final    NO GROWTH 5 DAYS Performed at [facility]    Report Status 05/18/2015 FINAL  Final  MRSA PCR Screening     Status: Abnormal   Collection Time: 05/12/15  2:10 PM  Result Value  Ref Range Status   MRSA by PCR POSITIVE (A) NEGATIVE Final    Comment:        The GeneXpert MRSA Assay (FDA approved for NASAL specimens only), is one component of a comprehensive MRSA colonization surveillance program. It is not intended to diagnose MRSA infection nor to guide or monitor treatment for MRSA infections. RESULT CALLED TO, READ BACK BY AND VERIFIED WITH: Liz Beach RN 15:55 05/12/15 ([email or web address])   Culture, sputum-assessment     Status: None   Collection Time: 05/17/15  5:10 AM  Result Value Ref Range Status   Specimen Description SPUTUM  Final   Special Requests NONE  Final   Sputum evaluation   Final    THIS SPECIMEN IS ACCEPTABLE. RESPIRATORY CULTURE REPORT TO FOLLOW.   Report Status 05/17/2015 FINAL  Final  Culture, respiratory (NON-Expectorated)     Status: None   Collection Time: 05/17/15  5:10 AM  Result Value Ref Range Status   Specimen Description SPUTUM  Final   Special Requests NONE  Final   Gram Stain   Final    ABUNDANT WBC PRESENT,BOTH PMN AND MONONUCLEAR RARE SQUAMOUS EPITHELIAL CELLS PRESENT FEW GRAM POSITIVE COCCI IN PAIRS IN CLUSTERS Gram Stain Report Called to,Read Back By and Verified With: Gram Stain Report Called to,Read Back By and Verified With: [email or web address] AT [phone]  ON [id number] BY WEBSP Performed at [facility]    Culture   Final    MODERATE METHICILLIN RESISTANT STAPHYLOCOCCUS AUREUS Note: RIFAMPIN AND GENTAMICIN SHOULD NOT BE USED AS SINGLE DRUGS FOR TREATMENT OF STAPH INFECTIONS. Performed at [facility]    Report Status 05/19/2015 FINAL  Final   Organism ID, Bacteria METHICILLIN RESISTANT STAPHYLOCOCCUS AUREUS  Final      Susceptibility   Methicillin resistant staphylococcus aureus - MIC*    CLINDAMYCIN >=8 RESISTANT Resistant     ERYTHROMYCIN >=8 RESISTANT Resistant     GENTAMICIN <=0.5 SENSITIVE Sensitive     LEVOFLOXACIN >=8 RESISTANT Resistant     OXACILLIN >=4 RESISTANT Resistant     PENICILLIN >=0.5 RESISTANT  Resistant     RIFAMPIN <=0.5 SENSITIVE Sensitive     TRIMETH/SULFA <=10 SENSITIVE Sensitive     VANCOMYCIN <=0.5 SENSITIVE Sensitive     TETRACYCLINE <=1 SENSITIVE Sensitive     * MODERATE METHICILLIN RESISTANT STAPHYLOCOCCUS AUREUS    Anti-infectives    Start     Dose/Rate Route Frequency Ordered Stop   05/17/15 1300  ceFEPIme (MAXIPIME) 2 g in dextrose 5 % 50 mL IVPB  Status:  Discontinued     2 g 100 mL/hr over 30 Minutes Intravenous Every 12 hours 05/17/15 1214 05/19/15 1512   05/17/15 0930  piperacillin-tazobactam (ZOSYN) IVPB 3.375 g  Status:  Discontinued     3.375 g 12.5 mL/hr over 240 Minutes Intravenous 3 times per day 05/17/15 0922 05/17/15 1214   05/15/15 0800  vancomycin (VANCOCIN) 1,250 mg in sodium chloride 0.9 % 250 mL IVPB  Status:  Discontinued     1,250 mg 166.7 mL/hr over 90 Minutes Intravenous Every 12 hours 05/14/15 1737 05/17/15 1208   05/12/15 1600  vancomycin (VANCOCIN) IVPB 1000 mg/200 mL premix  Status:  Discontinued     1,000 mg 200 mL/hr over 60 Minutes Intravenous 3 times per day 05/12/15 1437 05/14/15 1737   05/12/15 0700  ceFEPIme (MAXIPIME) 1 g in dextrose 5 % 50 mL IVPB  Status:  Discontinued     1 g 100 mL/hr over 30 Minutes Intravenous 3 times per day 05/12/15 0557 05/17/15 0854   05/12/15 0700  vancomycin (VANCOCIN) IVPB 1000 mg/200 mL premix  Status:  Discontinued     1,000 mg 200 mL/hr over 60 Minutes Intravenous Every 8 hours 05/12/15 0603 05/12/15 0616   05/12/15 0630  vancomycin (VANCOCIN) IVPB 1000 mg/200 mL premix  Status:  Discontinued     1,000 mg 200 mL/hr over 60 Minutes Intravenous 3 times per day 05/12/15 0616 05/12/15 1437   05/09/15 1000  azithromycin (ZITHROMAX) tablet 500 mg  Status:  Discontinued     500 mg Oral Daily 05/08/15 1307 05/09/15 0840   05/09/15 1000  levofloxacin (LEVAQUIN) tablet 500 mg  Status:  Discontinued     500 mg Oral Daily 05/09/15 0840 05/12/15 0557   05/06/15 1300  cefTRIAXone (ROCEPHIN) 1 g in dextrose 5  % 50 mL IVPB - Premix  Status:  Discontinued     1 g 100 mL/hr over 30 Minutes Intravenous Every 24 hours 05/06/15 1204 05/09/15 0840   05/06/15 1300  azithromycin (ZITHROMAX) 500 mg in dextrose 5 % 250 mL IVPB  Status:  Discontinued     500 mg 250 mL/hr over 60 Minutes Intravenous Every 24 hours 05/06/15 1204 05/08/15 1307      Assessment: 63 YOM on day 14 of abx    He remains afebrile, WBC wnl  and renal function has been stable, with est. crcl ~ 90-100 ml/min. On chest CT was noted to have pulmonary abscess. Per Dr. Dennie Maizes recommendations, will d/c cefepime and initiate Zosyn for anaerobic coverage.   Now with MRSA pneumonia after vanc stopped yesterday, d/w triad and will restart today with plans to discharge on this.  IV azithromycin 05/06/2015>>>>05/08/15 Rocephin 05/06/2015>>>>>05/08/15 Oral levaquin 05/08/15>>>>> 05/12/2015  vancomycin 05/12/2015>>5/29, 5/30>> cefepime 05/12/2015>>5/28, 5/29>> Zosyn 5/28>>5/29  5/25 VT>>24 (dose decreased to 1250 q12h) 5/28 VT>>19  5/23 Bld Cx2>>NGTD   Goal of Therapy:  Vancomycin trough level 15-20 mcg/ml  Plan:  - Resume vancomycin 1250 mg IV Q 12 hrs - Continue cefepime  Sheppard Coil PharmD., BCPS Clinical Pharmacist Pager [phone] 05/19/2015 3:14 PM

## 2015-05-19 NOTE — Progress Notes (Addendum)
PULMONARY / CRITICAL CARE MEDICINE   Name: [name] MRN: [id number] DOB: 1951-02-03    ADMISSION DATE:  05/04/2015 CONSULTATION DATE:  5/23  REFERRING MD :  Janee Morn   CHIEF COMPLAINT:   Acute respiratory failure   INITIAL PRESENTATION:  64 yr male w/ chronic respiratory failure (on 3 liters at home) f/b [place] w/ COPD, also has h/o CAD and gd I diastolic dysfxn. Initially admitted 5/15 w/ working dx of AECOPD +/- decompensated diastolic heart failure and volume overload. Had been making slow improvements until the am of 5/23 when he developed acute respiratory distress and progressive hypoxia and hypercarbia. CXR w/ new RUL airspace disease. PCCM asked to assist w/ care.    STUDIES:  ECHO 5/18: EF 60-65%; Doppler parameters are consistent with abnormal left ventricularrelaxation (grade 1 diastolic dysfunction). CXR 5/24 >>> RUL bullae with fluid, ? Abscess. CXR 5/25 >>> enlarging RUL abscess.  SIGNIFICANT EVENTS: Transient BIPAP 5/23 AM   SUBJECTIVE:  less prod cough  VITAL SIGNS: Temp:  [97.8 F (36.6 C)-98.5 F (36.9 C)] 97.8 F (36.6 C) (05/30 0454) Pulse Rate:  [70-99] 70 (05/30 0635) Resp:  [12-22] 15 (05/30 0635) BP: (116-147)/(57-92) 136/70 mmHg (05/30 0635) SpO2:  [96 %-100 %] 100 % (05/30 0859) Weight:  [83.28 kg (183 lb 9.6 oz)] 83.28 kg (183 lb 9.6 oz) (05/30 0454)       INTAKE / OUTPUT:  Intake/Output Summary (Last 24 hours) at 05/19/15 1512 Last data filed at 05/19/15 1234  Gross per 24 hour  Intake    243 ml  Output   1550 ml  Net  -1307 ml    PHYSICAL EXAMINATION: General:  Chronically ill appearing, resting in bed, in NAD. HENT: NCAT OP clear. Eyes: EOMi, sclera anicteric CV: RRR, no mgr. PULM: normal air movement, RUL rhonchi with very faint wheeze. GI: BS+, soft, nontender. Derm: chronic edema noted, bruising extensor surfaces, thin skin. Neuro: Awake, alert, oriented x4, MAE's.  LABS:  CBC  Recent Labs Lab 05/15/15 2220  05/16/15 0528 05/18/15 0646  WBC 8.8 7.3 7.4  HGB 8.1* 8.3* 8.8*  HCT 25.9* 26.5* 28.2*  PLT 161 166 188   Coag's No results for input(s): APTT, INR in the last 168 hours. BMET  Recent Labs Lab 05/15/15 0426 05/16/15 0528 05/18/15 0646  NA 136 135 133*  K 3.9 3.6 3.9  CL 90* 89* 90*  CO2 38* 39* 37*  BUN CREATININE 0.51* 0.44* 0.44*  GLUCOSE 110* 159* 88   Electrolytes  Recent Labs Lab 05/13/15 0528 05/14/15 0555 05/15/15 0426 05/16/15 0528 05/18/15 0646  CALCIUM 8.3* 8.1* 8.1* 7.9* 8.3*  MG 2.2 2.2  --   --   --    Sepsis Markers No results for input(s): LATICACIDVEN, PROCALCITON, O2SATVEN in the last 168 hours. ABG No results for input(s): PHART, PCO2ART, PO2ART in the last 168 hours. Liver Enzymes No results for input(s): AST, ALT, ALKPHOS, BILITOT, ALBUMIN in the last 168 hours. Cardiac Enzymes  Recent Labs Lab 05/12/15 1625 05/12/15 2225  TROPONINI <0.03 <0.03   Glucose  Recent Labs Lab 05/18/15 1117 05/18/15 1650 05/18/15 2027 05/18/15 2227 05/19/15 0734 05/19/15 1124  GLUCAP 148* 190* 189* 207* 85 272*    Imaging 5/28 CXR images personally reviewed>RUL cavity , less air fluid level  ASSESSMENT / PLAN: Principal Problem:   Lung abscess RUL Active Problems:   Essential hypertension   Coronary atherosclerosis   Acute on chronic respiratory failure with hypoxia  Bilateral lower extremity edema   COPD exacerbation   Diabetes   Acute on chronic diastolic CHF (congestive heart failure)   Weakness of both legs   History of CHF (congestive heart failure)   Other chest pain   DM (diabetes mellitus) type II uncontrolled, periph vascular disorder   Acute on chronic respiratory failure   HCAP (healthcare-associated pneumonia)   Esophageal reflux   Necrotizing pneumonia   Hypoxemia   Pressure ulcer   MRSA pneumonia with RUL lung abscess   PULMONARY A: Acute on Chronic Respiratory failure in the setting of RUL HCAP  complicated by upper airway wheezing from acid reflux/upper airway irritation and encephalopathy from narcotics RUL abscess -centered near RUL bullae Recent AE COPD> improving Severe COPD baseline, chronic prednisone use P:   Continue O2 as needed Cont BD therapy Change to vancomycin on d/c to snf x 7 days further    INFECTIOUS A:  HCAP MRSA with cavitary PNA P:   BCx2 5/23>>>neg Sputum cult 5/28>>>MRSA maxipime 5/23>>>d/c 5/27 >>resume 5/28>>5/30 Zosyn  5/27>> 5/28 vanc 5/23>>> 5/28>>resume 5/30 Ceftriaxone 5/17 >>> 5/20 Levaquin 5/20 >>> 5/23  Needs 7more days vancomycin then oral bactrim for 10days Ok to d/c to snf today, has picc line pccm will f/u as outpt  Spoke to Primary team   Luisa Hartatrick WrightMD Beeper  [phone]  Cell  [phone]  If no response or cell goes to voicemail, call beeper [phone]  05/19/2015, 3:12 PM

## 2015-05-19 NOTE — Progress Notes (Signed)
PT Cancellation Note  Patient Details Name: [name] MRN: [id number] DOB: 10/27/1951   Cancelled Treatment:    Reason Eval/Treat Not Completed: Other (comment) Pt dressed and awaiting transport to take him to SNF ~1700. Will follow up if still in hospital tomorrow.   Blake DivineShauna A Catalia Massett 05/19/2015, 4:47 PM Mylo RedShauna Karmine Kauer, PT, DPT [phone]

## 2015-05-19 NOTE — Progress Notes (Signed)
Report called to Fernand ParkinsBrandy Richardson, LPN receiving nurse at [facility]. Questions answered. Pt currently receiving IV vancomycin. Awaiting PTAR scheduled for 1700 to pick up.

## 2015-05-20 ENCOUNTER — Other Ambulatory Visit: Payer: Self-pay | Admitting: *Deleted

## 2015-05-21 ENCOUNTER — Non-Acute Institutional Stay (SKILLED_NURSING_FACILITY): Payer: Self-pay | Admitting: Adult Health

## 2015-05-21 DIAGNOSIS — I10 Essential (primary) hypertension: Secondary | ICD-10-CM

## 2015-05-21 DIAGNOSIS — IMO0002 Reserved for concepts with insufficient information to code with codable children: Secondary | ICD-10-CM

## 2015-05-21 DIAGNOSIS — I5033 Acute on chronic diastolic (congestive) heart failure: Secondary | ICD-10-CM

## 2015-05-21 DIAGNOSIS — K2100 Gastro-esophageal reflux disease with esophagitis, without bleeding: Secondary | ICD-10-CM

## 2015-05-21 DIAGNOSIS — I2510 Atherosclerotic heart disease of native coronary artery without angina pectoris: Secondary | ICD-10-CM

## 2015-05-21 DIAGNOSIS — E1151 Type 2 diabetes mellitus with diabetic peripheral angiopathy without gangrene: Secondary | ICD-10-CM

## 2015-05-21 DIAGNOSIS — J852 Abscess of lung without pneumonia: Secondary | ICD-10-CM

## 2015-05-21 DIAGNOSIS — J441 Chronic obstructive pulmonary disease with (acute) exacerbation: Secondary | ICD-10-CM

## 2015-05-21 DIAGNOSIS — E1165 Type 2 diabetes mellitus with hyperglycemia: Secondary | ICD-10-CM

## 2015-05-21 DIAGNOSIS — E1159 Type 2 diabetes mellitus with other circulatory complications: Secondary | ICD-10-CM

## 2015-05-23 ENCOUNTER — Emergency Department (HOSPITAL_COMMUNITY): Payer: Medicare Other

## 2015-05-23 ENCOUNTER — Encounter: Payer: Self-pay | Admitting: Internal Medicine

## 2015-05-23 ENCOUNTER — Non-Acute Institutional Stay (SKILLED_NURSING_FACILITY): Payer: Self-pay | Admitting: Internal Medicine

## 2015-05-23 ENCOUNTER — Inpatient Hospital Stay (HOSPITAL_COMMUNITY)
Admission: EM | Admit: 2015-05-23 | Discharge: 2015-06-03 | DRG: 280 | Disposition: A | Discharge status: Home Health | Payer: Medicare Other | Attending: Internal Medicine | Admitting: Internal Medicine

## 2015-05-23 ENCOUNTER — Encounter (HOSPITAL_COMMUNITY): Payer: Self-pay | Admitting: *Deleted

## 2015-05-23 DIAGNOSIS — E785 Hyperlipidemia, unspecified: Secondary | ICD-10-CM | POA: Diagnosis present

## 2015-05-23 DIAGNOSIS — J441 Chronic obstructive pulmonary disease with (acute) exacerbation: Secondary | ICD-10-CM | POA: Diagnosis present

## 2015-05-23 DIAGNOSIS — J851 Abscess of lung with pneumonia: Secondary | ICD-10-CM | POA: Diagnosis present

## 2015-05-23 DIAGNOSIS — I214 Non-ST elevation (NSTEMI) myocardial infarction: Secondary | ICD-10-CM | POA: Insufficient documentation

## 2015-05-23 DIAGNOSIS — J9621 Acute and chronic respiratory failure with hypoxia: Secondary | ICD-10-CM

## 2015-05-23 DIAGNOSIS — I10 Essential (primary) hypertension: Secondary | ICD-10-CM | POA: Diagnosis present

## 2015-05-23 DIAGNOSIS — K219 Gastro-esophageal reflux disease without esophagitis: Secondary | ICD-10-CM | POA: Diagnosis present

## 2015-05-23 DIAGNOSIS — Z7951 Long term (current) use of inhaled steroids: Secondary | ICD-10-CM | POA: Diagnosis not present

## 2015-05-23 DIAGNOSIS — I25110 Atherosclerotic heart disease of native coronary artery with unstable angina pectoris: Secondary | ICD-10-CM | POA: Diagnosis present

## 2015-05-23 DIAGNOSIS — J45909 Unspecified asthma, uncomplicated: Secondary | ICD-10-CM | POA: Diagnosis present

## 2015-05-23 DIAGNOSIS — R0789 Other chest pain: Secondary | ICD-10-CM | POA: Diagnosis not present

## 2015-05-23 DIAGNOSIS — J852 Abscess of lung without pneumonia: Secondary | ICD-10-CM

## 2015-05-23 DIAGNOSIS — Z7952 Long term (current) use of systemic steroids: Secondary | ICD-10-CM

## 2015-05-23 DIAGNOSIS — F411 Generalized anxiety disorder: Secondary | ICD-10-CM

## 2015-05-23 DIAGNOSIS — Z79899 Other long term (current) drug therapy: Secondary | ICD-10-CM

## 2015-05-23 DIAGNOSIS — J438 Other emphysema: Secondary | ICD-10-CM | POA: Diagnosis not present

## 2015-05-23 DIAGNOSIS — J9811 Atelectasis: Secondary | ICD-10-CM | POA: Diagnosis present

## 2015-05-23 DIAGNOSIS — I248 Other forms of acute ischemic heart disease: Secondary | ICD-10-CM | POA: Diagnosis present

## 2015-05-23 DIAGNOSIS — I509 Heart failure, unspecified: Secondary | ICD-10-CM

## 2015-05-23 DIAGNOSIS — Z87891 Personal history of nicotine dependence: Secondary | ICD-10-CM

## 2015-05-23 DIAGNOSIS — Z9981 Dependence on supplemental oxygen: Secondary | ICD-10-CM | POA: Diagnosis not present

## 2015-05-23 DIAGNOSIS — Z86718 Personal history of other venous thrombosis and embolism: Secondary | ICD-10-CM | POA: Diagnosis not present

## 2015-05-23 DIAGNOSIS — A419 Sepsis, unspecified organism: Secondary | ICD-10-CM | POA: Diagnosis present

## 2015-05-23 DIAGNOSIS — E1151 Type 2 diabetes mellitus with diabetic peripheral angiopathy without gangrene: Secondary | ICD-10-CM | POA: Diagnosis present

## 2015-05-23 DIAGNOSIS — I5032 Chronic diastolic (congestive) heart failure: Secondary | ICD-10-CM

## 2015-05-23 DIAGNOSIS — E876 Hypokalemia: Secondary | ICD-10-CM | POA: Diagnosis present

## 2015-05-23 DIAGNOSIS — F419 Anxiety disorder, unspecified: Secondary | ICD-10-CM | POA: Diagnosis present

## 2015-05-23 DIAGNOSIS — J449 Chronic obstructive pulmonary disease, unspecified: Secondary | ICD-10-CM | POA: Diagnosis not present

## 2015-05-23 DIAGNOSIS — J9612 Chronic respiratory failure with hypercapnia: Secondary | ICD-10-CM

## 2015-05-23 DIAGNOSIS — Z7982 Long term (current) use of aspirin: Secondary | ICD-10-CM | POA: Diagnosis not present

## 2015-05-23 DIAGNOSIS — B9562 Methicillin resistant Staphylococcus aureus infection as the cause of diseases classified elsewhere: Secondary | ICD-10-CM | POA: Diagnosis present

## 2015-05-23 DIAGNOSIS — Y95 Nosocomial condition: Secondary | ICD-10-CM | POA: Diagnosis present

## 2015-05-23 DIAGNOSIS — Z955 Presence of coronary angioplasty implant and graft: Secondary | ICD-10-CM

## 2015-05-23 DIAGNOSIS — I2510 Atherosclerotic heart disease of native coronary artery without angina pectoris: Secondary | ICD-10-CM | POA: Diagnosis not present

## 2015-05-23 DIAGNOSIS — R0602 Shortness of breath: Secondary | ICD-10-CM

## 2015-05-23 DIAGNOSIS — Z8249 Family history of ischemic heart disease and other diseases of the circulatory system: Secondary | ICD-10-CM | POA: Diagnosis not present

## 2015-05-23 DIAGNOSIS — IMO0002 Reserved for concepts with insufficient information to code with codable children: Secondary | ICD-10-CM | POA: Diagnosis present

## 2015-05-23 DIAGNOSIS — E1165 Type 2 diabetes mellitus with hyperglycemia: Secondary | ICD-10-CM | POA: Diagnosis present

## 2015-05-23 DIAGNOSIS — E1159 Type 2 diabetes mellitus with other circulatory complications: Secondary | ICD-10-CM | POA: Diagnosis not present

## 2015-05-23 DIAGNOSIS — G8929 Other chronic pain: Secondary | ICD-10-CM | POA: Diagnosis present

## 2015-05-23 DIAGNOSIS — D649 Anemia, unspecified: Secondary | ICD-10-CM | POA: Diagnosis present

## 2015-05-23 DIAGNOSIS — I2584 Coronary atherosclerosis due to calcified coronary lesion: Secondary | ICD-10-CM | POA: Diagnosis present

## 2015-05-23 DIAGNOSIS — R0603 Acute respiratory distress: Secondary | ICD-10-CM | POA: Diagnosis present

## 2015-05-23 DIAGNOSIS — Z8679 Personal history of other diseases of the circulatory system: Secondary | ICD-10-CM

## 2015-05-23 DIAGNOSIS — Z7902 Long term (current) use of antithrombotics/antiplatelets: Secondary | ICD-10-CM | POA: Diagnosis not present

## 2015-05-23 DIAGNOSIS — I252 Old myocardial infarction: Secondary | ICD-10-CM

## 2015-05-23 DIAGNOSIS — R079 Chest pain, unspecified: Secondary | ICD-10-CM

## 2015-05-23 DIAGNOSIS — G4733 Obstructive sleep apnea (adult) (pediatric): Secondary | ICD-10-CM | POA: Diagnosis present

## 2015-05-23 DIAGNOSIS — Z794 Long term (current) use of insulin: Secondary | ICD-10-CM

## 2015-05-23 DIAGNOSIS — J850 Gangrene and necrosis of lung: Secondary | ICD-10-CM

## 2015-05-23 DIAGNOSIS — Z8673 Personal history of transient ischemic attack (TIA), and cerebral infarction without residual deficits: Secondary | ICD-10-CM

## 2015-05-23 DIAGNOSIS — I5043 Acute on chronic combined systolic (congestive) and diastolic (congestive) heart failure: Secondary | ICD-10-CM | POA: Diagnosis present

## 2015-05-23 DIAGNOSIS — J432 Centrilobular emphysema: Secondary | ICD-10-CM | POA: Diagnosis not present

## 2015-05-23 DIAGNOSIS — K210 Gastro-esophageal reflux disease with esophagitis: Secondary | ICD-10-CM

## 2015-05-23 DIAGNOSIS — I209 Angina pectoris, unspecified: Secondary | ICD-10-CM | POA: Diagnosis not present

## 2015-05-23 HISTORY — DX: Dorsalgia, unspecified: M54.9

## 2015-05-23 HISTORY — DX: Obstructive sleep apnea (adult) (pediatric): G47.33

## 2015-05-23 HISTORY — DX: Dependence on other enabling machines and devices: Z99.89

## 2015-05-23 HISTORY — DX: Type 2 diabetes mellitus without complications: E11.9

## 2015-05-23 HISTORY — DX: Anxiety disorder, unspecified: F41.9

## 2015-05-23 HISTORY — DX: Personal history of other medical treatment: Z92.89

## 2015-05-23 HISTORY — DX: Other chronic pain: G89.29

## 2015-05-23 LAB — COMPREHENSIVE METABOLIC PANEL
ALBUMIN: 2.4 g/dL — AB (ref 3.5–5.0)
ALK PHOS: 101 U/L (ref 38–126)
ALT: 21 U/L (ref 17–63)
AST: 24 U/L (ref 15–41)
Anion gap: 11 (ref 5–15)
BILIRUBIN TOTAL: 0.5 mg/dL (ref 0.3–1.2)
BUN: 10 mg/dL (ref 6–20)
CO2: 30 mmol/L (ref 22–32)
Calcium: 8.2 mg/dL — ABNORMAL LOW (ref 8.9–10.3)
Chloride: 88 mmol/L — ABNORMAL LOW (ref 101–111)
Creatinine, Ser: 0.6 mg/dL — ABNORMAL LOW (ref 0.61–1.24)
GFR calc non Af Amer: >60 mL/min (ref >60)
Glucose, Bld: 136 mg/dL — ABNORMAL HIGH (ref 65–99)
Potassium: 4.5 mmol/L (ref 3.5–5.1)
Sodium: 129 mmol/L — ABNORMAL LOW (ref 135–145)
Total Protein: 5.2 g/dL — ABNORMAL LOW (ref 6.5–8.1)

## 2015-05-23 LAB — APTT: aPTT: 25 seconds (ref 24–37)

## 2015-05-23 LAB — URINE MICROSCOPIC-ADD ON

## 2015-05-23 LAB — CBC WITH DIFFERENTIAL/PLATELET
Basophils Absolute: 0 10*3/uL (ref 0.0–0.1)
Basophils Relative: 0 % (ref 0–1)
EOS ABS: 0 10*3/uL (ref 0.0–0.7)
Eosinophils Relative: 0 % (ref 0–5)
HCT: 26.7 % — ABNORMAL LOW (ref 39.0–52.0)
Hemoglobin: 8.7 g/dL — ABNORMAL LOW (ref 13.0–17.0)
LYMPHS ABS: 0.3 10*3/uL — AB (ref 0.7–4.0)
LYMPHS PCT: 2 % — AB (ref 12–46)
MCH: 28.2 pg (ref 26.0–34.0)
MCHC: 32.6 g/dL (ref 30.0–36.0)
MCV: 86.7 fL (ref 78.0–100.0)
MONO ABS: 0.3 10*3/uL (ref 0.1–1.0)
MONOS PCT: 2 % — AB (ref 3–12)
NEUTROS ABS: 12.9 10*3/uL — AB (ref 1.7–7.7)
Neutrophils Relative %: 96 % — ABNORMAL HIGH (ref 43–77)
PLATELETS: 124 10*3/uL — AB (ref 150–400)
RBC: 3.08 MIL/uL — AB (ref 4.22–5.81)
RDW: 16.2 % — ABNORMAL HIGH (ref 11.5–15.5)
WBC: 13.5 10*3/uL — AB (ref 4.0–10.5)

## 2015-05-23 LAB — BLOOD GAS, ARTERIAL
ACID-BASE EXCESS: 7.6 mmol/L — AB (ref 0.0–2.0)
Bicarbonate: 32.4 mEq/L — ABNORMAL HIGH (ref 20.0–24.0)
DRAWN BY: 43707
O2 Content: 4 L/min
O2 Saturation: 98.1 %
PCO2 ART: 52.2 mmHg — AB (ref 35.0–45.0)
PO2 ART: 113 mmHg — AB (ref 80.0–100.0)
Patient temperature: 98.6
TCO2: 34 mmol/L (ref 0–100)
pH, Arterial: 7.409 (ref 7.350–7.450)

## 2015-05-23 LAB — I-STAT TROPONIN, ED: Troponin i, poc: 0.11 ng/mL (ref 0.00–0.08)

## 2015-05-23 LAB — TROPONIN I: TROPONIN I: 0.11 ng/mL — AB (ref <0.031)

## 2015-05-23 LAB — PROCALCITONIN: Procalcitonin: 0.33 ng/mL

## 2015-05-23 LAB — URINALYSIS, ROUTINE W REFLEX MICROSCOPIC
Bilirubin Urine: NEGATIVE
Glucose, UA: NEGATIVE mg/dL
Hgb urine dipstick: NEGATIVE
Ketones, ur: NEGATIVE mg/dL
NITRITE: NEGATIVE
PH: 6 (ref 5.0–8.0)
Protein, ur: NEGATIVE mg/dL
Specific Gravity, Urine: 1.015 (ref 1.005–1.030)
UROBILINOGEN UA: 0.2 mg/dL (ref 0.0–1.0)

## 2015-05-23 LAB — PROTIME-INR
INR: 0.94 (ref 0.00–1.49)
Prothrombin Time: 12.8 seconds (ref 11.6–15.2)

## 2015-05-23 LAB — LACTIC ACID, PLASMA: Lactic Acid, Venous: 3 mmol/L (ref 0.5–2.0)

## 2015-05-23 LAB — I-STAT CG4 LACTIC ACID, ED: Lactic Acid, Venous: 2.93 mmol/L (ref 0.5–2.0)

## 2015-05-23 LAB — GLUCOSE, CAPILLARY: GLUCOSE-CAPILLARY: 222 mg/dL — AB (ref 65–99)

## 2015-05-23 MED ORDER — ALUM & MAG HYDROXIDE-SIMETH 200-200-20 MG/5ML PO SUSP: 30.0000 mL | Freq: Four times a day (QID) | ORAL | Status: DC | PRN

## 2015-05-23 MED ORDER — METHYLPREDNISOLONE SODIUM SUCC 125 MG IJ SOLR
60.0000 mg | Freq: Two times a day (BID) | INTRAMUSCULAR | Status: DC
Start: 2015-05-24 — End: 2015-05-25
  Administered 2015-05-24 (×3): 60 mg via INTRAVENOUS
  Filled 2015-05-23 (×3): qty 2

## 2015-05-23 MED ORDER — VANCOMYCIN HCL IN DEXTROSE 1-5 GM/200ML-% IV SOLN: 1000.0000 mg | Freq: Two times a day (BID) | INTRAVENOUS | Status: DC

## 2015-05-23 MED ORDER — PIPERACILLIN-TAZOBACTAM 3.375 G IVPB
3.3750 g | Freq: Three times a day (TID) | INTRAVENOUS | Status: DC
  Administered 2015-05-24 – 2015-05-25 (×4): 3.375 g via INTRAVENOUS
  Filled 2015-05-23 (×6): qty 50

## 2015-05-23 MED ORDER — ACETAMINOPHEN 325 MG PO TABS: 650.0000 mg | ORAL_TABLET | Freq: Four times a day (QID) | ORAL | Status: DC | PRN

## 2015-05-23 MED ORDER — FENTANYL 12 MCG/HR TD PT72: 12.5000 ug | MEDICATED_PATCH | TRANSDERMAL | Status: DC

## 2015-05-23 MED ORDER — PIPERACILLIN-TAZOBACTAM 3.375 G IVPB 30 MIN
3.3750 g | Freq: Three times a day (TID) | INTRAVENOUS | Status: DC
  Administered 2015-05-23: 3.375 g via INTRAVENOUS
  Filled 2015-05-23 (×2): qty 50

## 2015-05-23 MED ORDER — PANTOPRAZOLE SODIUM 40 MG PO TBEC
40.0000 mg | DELAYED_RELEASE_TABLET | Freq: Two times a day (BID) | ORAL | Status: DC
  Administered 2015-05-23 – 2015-05-25 (×4): 40 mg via ORAL
  Filled 2015-05-23 (×4): qty 1

## 2015-05-23 MED ORDER — HEPARIN SODIUM (PORCINE) 5000 UNIT/ML IJ SOLN
5000.0000 [IU] | Freq: Three times a day (TID) | INTRAMUSCULAR | Status: DC
  Administered 2015-05-23 – 2015-05-26 (×7): 5000 [IU] via SUBCUTANEOUS
  Filled 2015-05-23 (×7): qty 1

## 2015-05-23 MED ORDER — MORPHINE SULFATE 4 MG/ML IJ SOLN
4.0000 mg | Freq: Once | INTRAMUSCULAR | Status: AC
Start: 2015-05-23 — End: 2015-05-23
  Administered 2015-05-23: 4 mg via INTRAVENOUS
  Filled 2015-05-23: qty 1

## 2015-05-23 MED ORDER — FUROSEMIDE 20 MG PO TABS: 40.0000 mg | ORAL_TABLET | Freq: Every day | ORAL | Status: DC

## 2015-05-23 MED ORDER — METHYLPREDNISOLONE SODIUM SUCC 125 MG IJ SOLR
125.0000 mg | Freq: Once | INTRAMUSCULAR | Status: AC
  Administered 2015-05-23: 125 mg via INTRAVENOUS
  Filled 2015-05-23: qty 2

## 2015-05-23 MED ORDER — PIPERACILLIN-TAZOBACTAM 3.375 G IVPB 30 MIN
3.3750 g | Freq: Once | INTRAVENOUS | Status: AC
  Administered 2015-05-23: 3.375 g via INTRAVENOUS
  Filled 2015-05-23: qty 50

## 2015-05-23 MED ORDER — ASPIRIN 325 MG PO TABS: 325.0000 mg | ORAL_TABLET | Freq: Every morning | ORAL | Status: DC

## 2015-05-23 MED ORDER — METOPROLOL TARTRATE 50 MG PO TABS
50.0000 mg | ORAL_TABLET | Freq: Two times a day (BID) | ORAL | Status: DC
  Administered 2015-05-23 – 2015-05-25 (×4): 50 mg via ORAL
  Filled 2015-05-23 (×4): qty 1

## 2015-05-23 MED ORDER — SODIUM CHLORIDE 0.9 % IV BOLUS (SEPSIS)
1000.0000 mL | Freq: Once | INTRAVENOUS | Status: AC
  Administered 2015-05-23: 1000 mL via INTRAVENOUS

## 2015-05-23 MED ORDER — ASPIRIN 325 MG PO TABS
325.0000 mg | ORAL_TABLET | Freq: Every day | ORAL | Status: DC
  Administered 2015-05-24 – 2015-05-26 (×3): 325 mg via ORAL
  Filled 2015-05-23 (×4): qty 1

## 2015-05-23 MED ORDER — ALBUTEROL SULFATE (2.5 MG/3ML) 0.083% IN NEBU
2.5000 mg | INHALATION_SOLUTION | RESPIRATORY_TRACT | Status: DC | PRN
  Administered 2015-05-24 – 2015-05-29 (×9): 2.5 mg via RESPIRATORY_TRACT
  Filled 2015-05-23 (×11): qty 3

## 2015-05-23 MED ORDER — GUAIFENESIN 100 MG/5ML PO SOLN
10.0000 mL | Freq: Four times a day (QID) | ORAL | Status: DC | PRN
  Filled 2015-05-23: qty 10

## 2015-05-23 MED ORDER — ONDANSETRON HCL 4 MG PO TABS: 4.0000 mg | ORAL_TABLET | Freq: Four times a day (QID) | ORAL | Status: DC | PRN

## 2015-05-23 MED ORDER — SODIUM CHLORIDE 0.9 % IV SOLN
INTRAVENOUS | Status: DC
Start: 2015-05-23 — End: 2015-05-24
  Administered 2015-05-23 – 2015-05-24 (×2): via INTRAVENOUS

## 2015-05-23 MED ORDER — INSULIN GLARGINE 100 UNIT/ML ~~LOC~~ SOLN
7.0000 [IU] | Freq: Every day | SUBCUTANEOUS | Status: DC
  Administered 2015-05-23 – 2015-05-25 (×3): 7 [IU] via SUBCUTANEOUS
  Filled 2015-05-23 (×3): qty 0.07

## 2015-05-23 MED ORDER — FENTANYL 12 MCG/HR TD PT72
12.5000 ug | MEDICATED_PATCH | TRANSDERMAL | Status: DC
  Administered 2015-05-25 – 2015-06-03 (×4): 12.5 ug via TRANSDERMAL
  Filled 2015-05-23 (×6): qty 1

## 2015-05-23 MED ORDER — ONDANSETRON HCL 4 MG/2ML IJ SOLN: 4.0000 mg | Freq: Four times a day (QID) | INTRAMUSCULAR | Status: DC | PRN

## 2015-05-23 MED ORDER — SODIUM CHLORIDE 0.9 % IJ SOLN
3.0000 mL | Freq: Two times a day (BID) | INTRAMUSCULAR | Status: DC
  Administered 2015-05-27 – 2015-06-02 (×8): 3 mL via INTRAVENOUS

## 2015-05-23 MED ORDER — SODIUM CHLORIDE 0.9 % IV SOLN
1250.0000 mg | Freq: Two times a day (BID) | INTRAVENOUS | Status: DC
  Administered 2015-05-24 – 2015-05-27 (×7): 1250 mg via INTRAVENOUS
  Filled 2015-05-23 (×8): qty 1250

## 2015-05-23 MED ORDER — FENTANYL CITRATE (PF) 100 MCG/2ML IJ SOLN
50.0000 ug | Freq: Once | INTRAMUSCULAR | Status: AC
  Administered 2015-05-23: 50 ug via INTRAMUSCULAR
  Filled 2015-05-23: qty 2

## 2015-05-23 MED ORDER — INSULIN ASPART 100 UNIT/ML ~~LOC~~ SOLN
0.0000 [IU] | Freq: Three times a day (TID) | SUBCUTANEOUS | Status: DC
  Administered 2015-05-24: 2 [IU] via SUBCUTANEOUS
  Administered 2015-05-24: 1 [IU] via SUBCUTANEOUS
  Administered 2015-05-24: 2 [IU] via SUBCUTANEOUS
  Administered 2015-05-25: 3 [IU] via SUBCUTANEOUS
  Administered 2015-05-25 – 2015-05-26 (×3): 2 [IU] via SUBCUTANEOUS
  Administered 2015-05-26: 1 [IU] via SUBCUTANEOUS
  Administered 2015-05-26: 3 [IU] via SUBCUTANEOUS
  Administered 2015-05-27: 1 [IU] via SUBCUTANEOUS
  Administered 2015-05-27 – 2015-05-28 (×3): 2 [IU] via SUBCUTANEOUS
  Administered 2015-05-29: 3 [IU] via SUBCUTANEOUS
  Administered 2015-05-29 – 2015-05-30 (×4): 2 [IU] via SUBCUTANEOUS
  Administered 2015-05-30 – 2015-05-31 (×2): 3 [IU] via SUBCUTANEOUS
  Administered 2015-05-31: 9 [IU] via SUBCUTANEOUS
  Administered 2015-06-01: 5 [IU] via SUBCUTANEOUS
  Administered 2015-06-01: 2 [IU] via SUBCUTANEOUS
  Administered 2015-06-01: 5 [IU] via SUBCUTANEOUS
  Administered 2015-06-02: 2 [IU] via SUBCUTANEOUS
  Administered 2015-06-02: 5 [IU] via SUBCUTANEOUS
  Administered 2015-06-03: 3 [IU] via SUBCUTANEOUS

## 2015-05-23 MED ORDER — NITROGLYCERIN IN D5W 200-5 MCG/ML-% IV SOLN
0.0000 ug/min | INTRAVENOUS | Status: DC
  Administered 2015-05-23: 5 ug/min via INTRAVENOUS
  Filled 2015-05-23: qty 250

## 2015-05-23 MED ORDER — IPRATROPIUM-ALBUTEROL 0.5-2.5 (3) MG/3ML IN SOLN
3.0000 mL | RESPIRATORY_TRACT | Status: DC
  Administered 2015-05-23 – 2015-05-25 (×10): 3 mL via RESPIRATORY_TRACT
  Filled 2015-05-23 (×9): qty 3

## 2015-05-23 MED ORDER — SODIUM CHLORIDE 0.9 % IV BOLUS (SEPSIS): 1000.0000 mL | Freq: Once | INTRAVENOUS | Status: DC

## 2015-05-23 MED ORDER — MORPHINE SULFATE 2 MG/ML IJ SOLN
2.0000 mg | INTRAMUSCULAR | Status: DC | PRN
  Administered 2015-05-23 – 2015-05-27 (×27): 2 mg via INTRAVENOUS
  Filled 2015-05-23 (×28): qty 1

## 2015-05-23 MED ORDER — TRAMADOL HCL 50 MG PO TABS
100.0000 mg | ORAL_TABLET | Freq: Four times a day (QID) | ORAL | Status: DC | PRN
  Administered 2015-05-23 – 2015-06-03 (×28): 100 mg via ORAL
  Filled 2015-05-23 (×29): qty 2

## 2015-05-23 MED ORDER — FLUTICASONE PROPIONATE 50 MCG/ACT NA SUSP
2.0000 | Freq: Two times a day (BID) | NASAL | Status: DC
  Administered 2015-05-24 – 2015-06-03 (×16): 2 via NASAL
  Filled 2015-05-23: qty 16

## 2015-05-23 MED ORDER — VANCOMYCIN HCL IN DEXTROSE 1-5 GM/200ML-% IV SOLN
1000.0000 mg | Freq: Once | INTRAVENOUS | Status: AC
  Administered 2015-05-23: 1000 mg via INTRAVENOUS
  Filled 2015-05-23: qty 200

## 2015-05-23 MED ORDER — NYSTATIN 100000 UNIT/ML MT SUSP
5.0000 mL | Freq: Four times a day (QID) | OROMUCOSAL | Status: DC
  Administered 2015-05-23 – 2015-06-03 (×39): 500000 [IU] via ORAL
  Filled 2015-05-23 (×39): qty 5

## 2015-05-23 MED ORDER — CLOPIDOGREL BISULFATE 75 MG PO TABS
75.0000 mg | ORAL_TABLET | Freq: Every day | ORAL | Status: DC
  Administered 2015-05-24 – 2015-05-26 (×3): 75 mg via ORAL
  Filled 2015-05-23 (×3): qty 1

## 2015-05-23 NOTE — Progress Notes (Signed)
Patient ID: [name], male   DOB: December 25, 1950, 64 y.o.   MRN: [id number]  Provider:  Elmarie Shiley L. Renato Gails, D.O., C.M.D. Location:  [facility]   PCP: Jeanann Lewandowsky, MD  Code Status: full code Goals of Care: Advanced Directive information Does patient have an advance directive?: No   Chief Complaint  Patient presents with  . New Admit To SNF    HPI: 64 y.o. male with h/o O2 and steroid dependent COPD, diastolic chf with EF 55%, CAD, DMII, HTN, was admitted here for short term rehab after hospitalization from 5/15-30 for chest pressure, shortness of breath, and wheezing and was found to have acute respiratory failure from right upper lobe pneumonia with abscess.  He was treated with abx (finally on vanc), solumedrol and fluids, later required some IV lasix.  He has only been here 4 days. Today, his dyspnea has worsened, he cannot get comfortable, he's had his oxygen upped to 4 liters and sats won't increase above 86% max.  He also had a neb treatment.  He remains very anxious, tachypneic, and is sitting on the side of the bed with a wet washcloth on his forehead.  He says his appetite is very poor today and he can't eat b/c he is so short of breath.  He is still on prednisone.  He says he responds well to solumedrol, but this is not acutely available here at [facility] and may take several hours to receive.    ROS: Review of Systems  Constitutional: Positive for malaise/fatigue. Negative for fever and chills.  Respiratory: Positive for cough, shortness of breath and wheezing.   Cardiovascular: Positive for chest pain and leg swelling. Negative for palpitations.  Gastrointestinal: Negative for abdominal pain.  Musculoskeletal: Negative for falls.  Neurological: Positive for dizziness and weakness.       Seems correlated with his tachypnea  Psychiatric/Behavioral: The patient is nervous/anxious.     Past Medical History  Diagnosis Date  . COPD (chronic obstructive pulmonary  disease)   . CAD (coronary artery disease)   . Hypertension   . Hyperlipidemia   . Osteomyelitis   . Myocardial infarction     "I've had a couple since 2006" (12/26/2013)  . Anginal pain   . Asthma   . Pneumonia     "have had it several times; maybe now too": (12/26/2013)  . Shortness of breath     "all the time" (12/26/2013)  . On home oxygen therapy     "2-3L; 24/7" (12/26/2013)  . GERD (gastroesophageal reflux disease)   . Daily headache   . Stroke     "they say I've had a couple" denies residual on 12/26/2013  . Arthritis     "hands; feet" (12/26/2013)  . Osteomyelitis hip     "right"  . Chronic lower back pain   . Diabetes mellitus without complication   . CHF (congestive heart failure)    Past Surgical History  Procedure Laterality Date  . Appendectomy  1963  . Cardiac catheterization  ? 2009; ?2012  . Coronary angioplasty with stent placement  2006; ?2008; ?2010    "2 + 2 + 1"   Social History:   reports that he quit smoking about 7 months ago. His smoking use included Cigarettes. He has a 12.75 pack-year smoking history. He has never used smokeless tobacco. He reports that he does not drink alcohol or use illicit drugs.  Family History  Problem Relation Age of Onset  . Coronary artery disease    .  Hypertension      Allergies  Allergen Reactions  . Lipitor [Atorvastatin Calcium] Other (See Comments)    Muscle aches    Medications: Patient's Medications  New Prescriptions   No medications on file  Previous Medications   ACETAMINOPHEN (TYLENOL) 325 MG TABLET    Take 2 tablets (650 mg total) by mouth every 6 (six) hours as needed for mild pain (or Fever >/= 101).   ALBUTEROL (PROVENTIL HFA;VENTOLIN HFA) 108 (90 BASE) MCG/ACT INHALER    Inhale 2 puffs into the lungs every 4 (four) hours as needed for wheezing or shortness of breath.   ALBUTEROL (PROVENTIL) (2.5 MG/3ML) 0.083% NEBULIZER SOLUTION    Take 3 mLs (2.5 mg total) by nebulization every 6 (six) hours as needed  for wheezing or shortness of breath. Dx 496   ASPIRIN 325 MG TABLET    Take 1 tablet (325 mg total) by mouth every morning.   BENZONATATE (TESSALON) 200 MG CAPSULE    Take 1 capsule (200 mg total) by mouth 3 (three) times daily.   CLOPIDOGREL (PLAVIX) 75 MG TABLET    Take 1 tablet (75 mg total) by mouth daily.   FENTANYL (DURAGESIC - DOSED MCG/HR) 12 MCG/HR    Place 1 patch (12.5 mcg total) onto the skin every 3 (three) days.   FLUTICASONE (FLONASE) 50 MCG/ACT NASAL SPRAY    Place 2 sprays into both nostrils 2 (two) times daily.   FUROSEMIDE (LASIX) 40 MG TABLET    Take 1 tablet (40 mg total) by mouth daily.   GUAIFENESIN (ROBITUSSIN) 100 MG/5ML SOLN    Take 10 mLs (200 mg total) by mouth every 6 (six) hours.   INSULIN GLARGINE (LANTUS) 100 UNIT/ML INJECTION    Inject 0.1 mLs (10 Units total) into the skin daily.   IPRATROPIUM (ATROVENT) 0.02 % NEBULIZER SOLUTION    Take 2.5 mLs (0.5 mg total) by nebulization every 4 (four) hours as needed for wheezing or shortness of breath.   METFORMIN (GLUCOPHAGE) 500 MG TABLET    Take 1 tablet (500 mg total) by mouth 2 (two) times daily with a meal.   METOPROLOL (LOPRESSOR) 50 MG TABLET    Take 1 tablet (50 mg total) by mouth 2 (two) times daily.   MOMETASONE-FORMOTEROL (DULERA) 100-5 MCG/ACT AERO    Inhale 2 puffs into the lungs 2 (two) times daily.   NITROGLYCERIN (NITROGLYN) 2 % OINTMENT    Apply 0.5 inches topically every 6 (six) hours.   NYSTATIN (MYCOSTATIN) 100000 UNIT/ML SUSPENSION    Take 5 mLs (500,000 Units total) by mouth 4 (four) times daily.   PANTOPRAZOLE (PROTONIX) 40 MG TABLET    Take 1 tablet (40 mg total) by mouth 2 (two) times daily.   POTASSIUM CHLORIDE SA (K-DUR,KLOR-CON) 20 MEQ TABLET    Take 1 tablet (20 mEq total) by mouth daily.   PREDNISONE (DELTASONE) 10 MG TABLET    Take 5 tablets daily for 3 days then 3 tablet daily   TRAMADOL (ULTRAM) 50 MG TABLET    Take 2 tablets (100 mg total) by mouth every 6 (six) hours as needed for moderate  pain.   VANCOMYCIN 1,250 MG IN SODIUM CHLORIDE 0.9 % 250 ML    Inject 1,250 mg into the vein every 12 (twelve) hours.  Modified Medications   No medications on file  Discontinued Medications   No medications on file     Physical Exam: Filed Vitals:   05/23/15 1521  BP: 154/78  Pulse: 84  Temp:  98.1 F (36.7 C)  Resp: 22  SpO2: 84%   There is no weight on file to calculate BMI. Physical Exam  Constitutional: He is oriented to person, place, and time.  Chronically ill, disheveled white male seated on edge of bed in distress  HENT:  Head: Normocephalic and atraumatic.  Cardiovascular: Normal rate, regular rhythm, normal heart sounds and intact distal pulses.   2+ pitting edema with slightly larger left than right extremity  Pulmonary/Chest: He is in respiratory distress. He has wheezes.  Coarse wet rhonchi throughout lung fields, wearing O2 4L [place]  Abdominal: Soft. Bowel sounds are normal.  Neurological: He is alert and oriented to person, place, and time.  Skin:  Left upper arm with PICC in place  Psychiatric:  Very anxious     Labs reviewed: Basic Metabolic Panel:  Recent Labs  30/86/5705/24/16 0528 05/14/15 0555 05/15/15 0426 05/16/15 0528 05/18/15 0646  NA 136 135 136 135 133*  K 3.8 4.1 3.9 3.6 3.9  CL 88* 89* 90* 89* 90*  CO2 37* 36* 38* 39* 37*  GLUCOSE 200* 250* 110* 159* 88  BUN 12 13 13 9 8   CREATININE 0.42* 0.44* 0.51* 0.44* 0.44*  CALCIUM 8.3* 8.1* 8.1* 7.9* 8.3*  MG 2.2 2.2  --   --   --    Liver Function Tests:  Recent Labs  10/28/14 0324 05/10/15 0349  AST 16 26  ALT 33 79*  ALKPHOS 63 143*  BILITOT <0.2* 0.3  PROT 6.0 5.9*  ALBUMIN 3.4* 2.2*   No results for input(s): LIPASE, AMYLASE in the last 8760 hours. No results for input(s): AMMONIA in the last 8760 hours. CBC:  Recent Labs  10/27/14 2315  05/13/15 0528  05/15/15 2220 05/16/15 0528 05/18/15 0646  WBC 7.4  < > 5.2  < > 8.8 7.3 7.4  NEUTROABS 6.3  --  4.9  --   --   --   --     HGB 12.4*  < > 7.7*  < > 8.1* 8.3* 8.8*  HCT 40.4  < > 25.1*  < > 25.9* 26.5* 28.2*  MCV 95.7  < > 90.6  < > 88.4 88.3 87.3  PLT 180  < > 188  < > 161 166 188  < > = values in this interval not displayed. Cardiac Enzymes:  Recent Labs  05/12/15 1025 05/12/15 1625 05/12/15 2225  TROPONINI 0.03 <0.03 <0.03   BNP: Invalid input(s): POCBNP  Lab Results  Component Value Date   HGBA1C 6.8* 05/07/2015   Lab Results  Component Value Date   TSH 0.975 01/05/2014   Lab Results  Component Value Date   VITAMINB12 166* 05/09/2015   Lab Results  Component Value Date   FOLATE 5.0* 05/09/2015   Lab Results  Component Value Date   IRON 36* 05/09/2015   TIBC 224* 05/09/2015   FERRITIN 901* 05/09/2015    Imaging and Procedures obtained prior to SNF admission: 05/12/15:  Right upper lob pneumonia, new since 5/15.  Emphysema.  05/15/15:  CT chest w/ contrast:  8.5 cm cavitary lesion with air-fluid level and surrounding inflammatory changes in the medial right upper lobe, new from prior CT, compatible with pulmonary abscess. 9 mm short axis right paratracheal node, likely reactive. Consider follow-up CT chest in 4-6 weeks to document Improvement/resolution.  05/15/15:  Thoracic xrays:   1. Stable multiple thoracic and upper lumbar vertebral compression deformities compared to the CT earlier today. These were also previously compared to previous radiographs  and shown to be stable. 2. Left PICC tip in the inferior aspect of the superior vena cava. If a position at the cavoatrial junction is desired, it would be recommended that this be advanced 2 cm. 3. Stable right upper lobe cavitary lesion.  05/16/15:  CXR:  Medial right upper lobe large cavitary mass compatible with a pulmonary abscess. No new finding by plain radiography  Patient Care Team: Quentin Angst, MD as PCP - General (Internal Medicine)  Assessment/Plan 1. Acute on chronic respiratory failure with hypoxia -sent  to hospital due to persistent hypoxia <88% despite O2 via [place] -he needs solumedrol and closer monitoring in order to receive some anxiolytics based on his history of respiratory failure from narcotics -he's been tachypneic all day and is getting fatigued but chest still tight, wheezing and coarse rhonchi  2. Lung abscess RUL -on vanc with long term plan to convert to bactrim until sees Dr. Delton Coombes in f/u   3. Necrotizing pneumonia -again, on vanc  4. Chronic diastolic CHF (congestive heart failure) -may have some acute exacerbation of this compounding his COPD exacerbation and acute respiratory failure--edema in legs is mild, but lungs are so coarse, I cannot determine if there's fluid in there too--needs imaging  5. Anxiety state -don't feel comfortable giving benzos with his breathing so poor at present and h/o respiratory failure from sedation in the past unless he's monitored in the hospital   Functional status:  Requiring some assist with bathing, dressing, transfers due to weakness at present for safety  Family/ staff Communication: discussed with his nurse, DNS  Labs/tests ordered: referred to ED  Jorden Mahl L. Karinda Cabriales, D.O. Geriatrics [facility] [address], [place] [address] Cell Phone (Mon-Fri 8am-5pm):  [phone] On Call:  [phone] & follow prompts after 5pm & weekends Office Phone:  [phone] Office Fax:  [phone]

## 2015-05-23 NOTE — ED Provider Notes (Signed)
CSN: [id number]     Arrival date & time 05/23/15  1637 History   First MD Initiated Contact with Patient 05/23/15 1639     Chief Complaint  Patient presents with  . Chest Pain  . Respiratory Distress     (Consider location/radiation/quality/duration/timing/severity/associated sxs/prior Treatment) The history is provided by the patient.  [name] is a 64 y.o. male hx of COPD, CAD, HTN, HL, pneumonia with lung abscess here presenting with shortness of breath, chest pain. He had chest pain started 2 days ago. They felt similar to his heart attack in February. He had a cardiac stent placed in [place] at that time. He has worsening shortness of breath since yesterday and has been a requiring more oxygen. Some nonproductive cough as well. Patient was noted to be hypoxic 74% on 4 L at the nursing home. He was 85% as per EMS. He was given aspirin 324mg , 3 albuterol nebs, in the nursing home. He was also given 2 duonebs by EMS. Moreover, he was started on vanc, PO bactrim since since discharge recently for lung abscess.   Level V caveat- condition of patient   Past Medical History  Diagnosis Date  . COPD (chronic obstructive pulmonary disease)   . CAD (coronary artery disease)   . Hypertension   . Hyperlipidemia   . Osteomyelitis   . Myocardial infarction     "I've had a couple since 2006" (12/26/2013)  . Anginal pain   . Asthma   . Pneumonia     "have had it several times; maybe now too": (12/26/2013)  . Shortness of breath     "all the time" (12/26/2013)  . On home oxygen therapy     "2-3L; 24/7" (12/26/2013)  . GERD (gastroesophageal reflux disease)   . Daily headache   . Stroke     "they say I've had a couple" denies residual on 12/26/2013  . Arthritis     "hands; feet" (12/26/2013)  . Osteomyelitis hip     "right"  . Chronic lower back pain   . Diabetes mellitus without complication   . CHF (congestive heart failure)    Past Surgical History  Procedure Laterality Date  .  Appendectomy  1963  . Cardiac catheterization  ? 2009; ?2012  . Coronary angioplasty with stent placement  2006; ?2008; ?2010    "2 + 2 + 1"   Family History  Problem Relation Age of Onset  . Coronary artery disease    . Hypertension     History  Substance Use Topics  . Smoking status: Former Smoker -- 0.25 packs/day for 51 years    Types: Cigarettes    Quit date: 09/27/2014  . Smokeless tobacco: Never Used  . Alcohol Use: No    Review of Systems  Respiratory: Positive for shortness of breath.   Cardiovascular: Positive for chest pain.  All other systems reviewed and are negative.     Allergies  Lipitor  Home Medications   Prior to Admission medications   Medication Sig Start Date End Date Taking? Authorizing Provider  acetaminophen (TYLENOL) 325 MG tablet Take 2 tablets (650 mg total) by mouth every 6 (six) hours as needed for mild pain (or Fever >/= 101). 05/19/15  Yes Belkys A Regalado, MD  albuterol (PROVENTIL HFA;VENTOLIN HFA) 108 (90 BASE) MCG/ACT inhaler Inhale 2 puffs into the lungs every 4 (four) hours as needed for wheezing or shortness of breath. 05/19/15  Yes Belkys A Regalado, MD  albuterol (PROVENTIL) (2.5 MG/3ML)  0.083% nebulizer solution Take 3 mLs (2.5 mg total) by nebulization every 6 (six) hours as needed for wheezing or shortness of breath. Dx 496 11/04/14  Yes Shanker Levora Dredge, MD  aspirin 325 MG tablet Take 1 tablet (325 mg total) by mouth every morning. 11/04/14  Yes Shanker Levora Dredge, MD  clopidogrel (PLAVIX) 75 MG tablet Take 1 tablet (75 mg total) by mouth daily. 11/04/14  Yes Shanker Levora Dredge, MD  fentaNYL (DURAGESIC - DOSED MCG/HR) 12 MCG/HR Place 1 patch (12.5 mcg total) onto the skin every 3 (three) days. 05/19/15  Yes Belkys A Regalado, MD  fluticasone (FLONASE) 50 MCG/ACT nasal spray Place 2 sprays into both nostrils 2 (two) times daily. 11/04/14  Yes Shanker Levora Dredge, MD  furosemide (LASIX) 40 MG tablet Take 1 tablet (40 mg total) by mouth  daily. 05/19/15  Yes Belkys A Regalado, MD  insulin glargine (LANTUS) 100 UNIT/ML injection Inject 0.1 mLs (10 Units total) into the skin daily. 05/19/15  Yes Belkys A Regalado, MD  ipratropium (ATROVENT) 0.02 % nebulizer solution Take 2.5 mLs (0.5 mg total) by nebulization every 4 (four) hours as needed for wheezing or shortness of breath. 12/27/14  Yes Leslye Peer, MD  metFORMIN (GLUCOPHAGE) 500 MG tablet Take 1 tablet (500 mg total) by mouth 2 (two) times daily with a meal. 11/04/14  Yes Shanker Levora Dredge, MD  metoprolol (LOPRESSOR) 50 MG tablet Take 1 tablet (50 mg total) by mouth 2 (two) times daily. 11/04/14  Yes Shanker Levora Dredge, MD  mometasone-formoterol (DULERA) 100-5 MCG/ACT AERO Inhale 2 puffs into the lungs 2 (two) times daily. 11/20/14  Yes Quentin Angst, MD  nitroGLYCERIN (NITROGLYN) 2 % ointment Apply 0.5 inches topically every 6 (six) hours. 05/19/15  Yes Belkys A Regalado, MD  nystatin (MYCOSTATIN) 100000 UNIT/ML suspension Take 5 mLs (500,000 Units total) by mouth 4 (four) times daily. 01/09/14  Yes Belkys A Regalado, MD  pantoprazole (PROTONIX) 40 MG tablet Take 1 tablet (40 mg total) by mouth 2 (two) times daily. Patient taking differently: Take 40 mg by mouth 2 (two) times daily. Patient states it's the  tablet 05/19/15  Yes Belkys A Regalado, MD  potassium chloride SA (K-DUR,KLOR-CON) 20 MEQ tablet Take 1 tablet (20 mEq total) by mouth daily. 11/04/14  Yes Shanker Levora Dredge, MD  predniSONE (DELTASONE) 10 MG tablet Take 5 tablets daily for 3 days then 3 tablet daily Patient taking differently: Take 10 mg by mouth See admin instructions. Take 5 tablets daily for 3 days then 3 tablet daily 05/19/15  Yes Belkys A Regalado, MD  traMADol (ULTRAM) 50 MG tablet Take 2 tablets (100 mg total) by mouth every 6 (six) hours as needed for moderate pain. 05/19/15  Yes Belkys A Regalado, MD  vancomycin 1,250 mg in sodium chloride 0.9 % 250 mL Inject 1,250 mg into the vein every 12 (twelve)  hours. 05/19/15  Yes Belkys A Regalado, MD  benzonatate (TESSALON) 200 MG capsule Take 1 capsule (200 mg total) by mouth 3 (three) times daily. Patient not taking: Reported on 05/23/2015 05/19/15   Belkys A Regalado, MD  guaiFENesin (ROBITUSSIN) 100 MG/5ML SOLN Take 10 mLs (200 mg total) by mouth every 6 (six) hours. Patient taking differently: Take 10 mLs by mouth every 6 (six) hours as needed for cough or to loosen phlegm.  05/19/15   Belkys A Regalado, MD   BP 130/74 mmHg  Pulse 95  Temp(Src) 97.9 F (36.6 C) (Oral)  Resp 13  Ht   (1.803 m)  Wt 187 lb (84.823 kg)  BMI 26.09 kg/m2  SpO2 95% Physical Exam  Constitutional: He is oriented to person, place, and time.  Tachypneic, chronically ill   HENT:  Head: Normocephalic.  Mouth/Throat: Oropharynx is clear and moist.  Eyes: Conjunctivae are normal. Pupils are equal, round, and reactive to light.  Neck: Normal range of motion. Neck supple.  Cardiovascular: Normal rate, regular rhythm and normal heart sounds.   Pulmonary/Chest:  Tachypneic, diminished throughout. Minimal wheezing. Mild retractions   Abdominal: Soft. Bowel sounds are normal. He exhibits no distension. There is no tenderness. There is no rebound.  Musculoskeletal: Normal range of motion. He exhibits no tenderness.  1+ edema bilaterally   Neurological: He is alert and oriented to person, place, and time. No cranial nerve deficit. Coordination normal.  Skin: Skin is warm and dry.  Psychiatric: He has a normal mood and affect. His behavior is normal. Judgment and thought content normal.  Nursing note and vitals reviewed.   ED Course  Procedures (including critical care time)  CRITICAL CARE Performed by: Silverio Lay, Ralene Gasparyan   Total critical care time: 30 min   Critical care time was exclusive of separately billable procedures and treating other patients.  Critical care was necessary to treat or prevent imminent or life-threatening deterioration.  Critical care was  time spent personally by me on the following activities: development of treatment plan with patient and/or surrogate as well as nursing, discussions with consultants, evaluation of patient's response to treatment, examination of patient, obtaining history from patient or surrogate, ordering and performing treatments and interventions, ordering and review of laboratory studies, ordering and review of radiographic studies, pulse oximetry and re-evaluation of patient's condition.   Labs Review Labs Reviewed  CBC WITH DIFFERENTIAL/PLATELET - Abnormal; Notable for the following:    WBC 13.5 (*)    RBC 3.08 (*)    Hemoglobin 8.7 (*)    HCT 26.7 (*)    RDW 16.2 (*)    Platelets 124 (*)    Neutrophils Relative % 96 (*)    Neutro Abs 12.9 (*)    Lymphocytes Relative 2 (*)    Lymphs Abs 0.3 (*)    Monocytes Relative 2 (*)    All other components within normal limits  COMPREHENSIVE METABOLIC PANEL - Abnormal; Notable for the following:    Sodium 129 (*)    Chloride 88 (*)    Glucose, Bld 136 (*)    Creatinine, Ser 0.60 (*)    Calcium 8.2 (*)    Total Protein 5.2 (*)    Albumin 2.4 (*)    All other components within normal limits  I-STAT CG4 LACTIC ACID, ED - Abnormal; Notable for the following:    Lactic Acid, Venous 2.93 (*)    All other components within normal limits  I-STAT TROPOININ, ED - Abnormal; Notable for the following:    Troponin i, poc 0.11 (*)    All other components within normal limits  CULTURE, BLOOD (ROUTINE X 2)  CULTURE, BLOOD (ROUTINE X 2)  URINE CULTURE  URINALYSIS, ROUTINE W REFLEX MICROSCOPIC (NOT AT [facility])  I-STAT CG4 LACTIC ACID, ED    Imaging Review Dg Chest Port 1 View  05/23/2015   CLINICAL DATA:  Acute chest pain today with chest congestion, shortness of breath, and wheezing. Left arm numbness.  EXAM: PORTABLE CHEST - 1 VIEW  COMPARISON:  05/16/2015, 04/03/2015 and chest CT dated 05/15/2015  FINDINGS: PICC tip just above the cavoatrial junction, unchanged.  There are new small Kerley B-lines at the left base and there is new slight atelectasis at the right base. Heart size and pulmonary vascularity are normal. Right lung abscess is smaller than on the prior study. No acute osseous abnormality.  IMPRESSION: 1. Decreased cavitary right upper lobe  lung abscess. 2. Bibasilar atelectasis with possible interstitial edema at the left lung base.   Electronically Signed   By: Francene Boyers M.D.   On: 05/23/2015 17:51     EKG Interpretation   Date/Time:  Friday May 23 2015 19:25:46 EDT Ventricular Rate:  95 PR Interval:  158 QRS Duration: 103 QT Interval:  360 QTC Calculation: 452 R Axis:   84 Text Interpretation:  Sinus rhythm Probable left atrial enlargement  Borderline right axis deviation Abnormal R-wave progression, late  transition No significant change since last tracing Confirmed by Coburn Knaus  MD,  Eschol Auxier ([id number]) on 05/23/2015 7:33:55 PM      MDM   Final diagnoses:  None    [name] is a 64 y.o. male here with SOB, chest pain. Consider ACS vs COPD vs recurrent pneumonia. Will do sepsis workup, trop, CXR.   6pm Troponin 0.11. Likely demand ischemia. Called Dr. Katrinka Blazing, cardiology, who will see patient. Recommend holding heparin given sepsis, COPD.   7:35 PM CXR showed persistent lung abscess. WBC elevated 13.5. Lactate elevated. Given vanc/zosyn. Hospitalist request consult with CT surgery. Discussed with Dr. Cornelius Moras, who recommend re consult pulmonary for evaluation.   Richardean Canal, MD 05/23/15 [phone]

## 2015-05-23 NOTE — Progress Notes (Signed)
ANTIBIOTIC CONSULT NOTE - INITIAL  Pharmacy Consult for Vancomycin and Zosyn (Rx may adjust abx for renal function) Indication: HCAP and RUL abscess  Allergies  Allergen Reactions  . Lipitor [Atorvastatin Calcium] Other (See Comments)    Muscle aches    Patient Measurements: Height: 5\' 11"  (180.3 cm) Weight: 182 lb (82.555 kg) IBW/kg (Calculated) : 75.3  Vital Signs: Temp: 97.7 F (36.5 C) (06/03 2215) Temp Source: Oral (06/03 2215) BP: 114/67 mmHg (06/03 2215) Pulse Rate: 80 (06/03 2215)  Labs:  Recent Labs  05/23/15 1722  WBC 13.5*  HGB 8.7*  PLT 124*  CREATININE 0.60*   Estimated Creatinine Clearance: 98 mL/min (by C-G formula based on Cr of 0.6).  Microbiology: Recent Results (from the past 720 hour(s))  Culture, blood (routine x 2) Call MD if unable to obtain prior to antibiotics being given     Status: None   Collection Time: 05/12/15  7:04 AM  Result Value Ref Range Status   Specimen Description BLOOD RIGHT ARM  Final   Special Requests BOTTLES DRAWN AEROBIC ONLY 5CC  Final   Culture   Final    NO GROWTH 5 DAYS Note: Culture results may be compromised due to an inadequate volume of blood received in culture bottles. Performed at [facility]    Report Status 05/18/2015 FINAL  Final  Culture, blood (routine x 2) Call MD if unable to obtain prior to antibiotics being given     Status: None   Collection Time: 05/12/15  7:14 AM  Result Value Ref Range Status   Specimen Description BLOOD RIGHT HAND  Final   Special Requests BOTTLES DRAWN AEROBIC ONLY 1CC  Final   Culture   Final    NO GROWTH 5 DAYS Performed at [facility]    Report Status 05/18/2015 FINAL  Final  MRSA PCR Screening     Status: Abnormal   Collection Time: 05/12/15  2:10 PM  Result Value Ref Range Status   MRSA by PCR POSITIVE (A) NEGATIVE Final    Comment:        The GeneXpert MRSA Assay (FDA approved for NASAL specimens only), is one component of a comprehensive  MRSA colonization surveillance program. It is not intended to diagnose MRSA infection nor to guide or monitor treatment for MRSA infections. RESULT CALLED TO, READ BACK BY AND VERIFIED WITH: Liz BeachP. STRAMOSKI RN 15:55 05/12/15 ([email or web address])   Culture, sputum-assessment     Status: None   Collection Time: 05/17/15  5:10 AM  Result Value Ref Range Status   Specimen Description SPUTUM  Final   Special Requests NONE  Final   Sputum evaluation   Final    THIS SPECIMEN IS ACCEPTABLE. RESPIRATORY CULTURE REPORT TO FOLLOW.   Report Status 05/17/2015 FINAL  Final  Culture, respiratory (NON-Expectorated)     Status: None   Collection Time: 05/17/15  5:10 AM  Result Value Ref Range Status   Specimen Description SPUTUM  Final   Special Requests NONE  Final   Gram Stain   Final    ABUNDANT WBC PRESENT,BOTH PMN AND MONONUCLEAR RARE SQUAMOUS EPITHELIAL CELLS PRESENT FEW GRAM POSITIVE COCCI IN PAIRS IN CLUSTERS Gram Stain Report Called to,Read Back By and Verified With: Gram Stain Report Called to,Read Back By and Verified With: [email or web address].O AT 0657 ON [id number] BY WEBSP Performed at [facility]    Culture   Final    MODERATE METHICILLIN RESISTANT STAPHYLOCOCCUS AUREUS Note: RIFAMPIN AND GENTAMICIN SHOULD NOT BE USED AS  SINGLE DRUGS FOR TREATMENT OF STAPH INFECTIONS. Performed at [facility]    Report Status 05/19/2015 FINAL  Final   Organism ID, Bacteria METHICILLIN RESISTANT STAPHYLOCOCCUS AUREUS  Final      Susceptibility   Methicillin resistant staphylococcus aureus - MIC*    CLINDAMYCIN >=8 RESISTANT Resistant     ERYTHROMYCIN >=8 RESISTANT Resistant     GENTAMICIN <=0.5 SENSITIVE Sensitive     LEVOFLOXACIN >=8 RESISTANT Resistant     OXACILLIN >=4 RESISTANT Resistant     PENICILLIN >=0.5 RESISTANT Resistant     RIFAMPIN <=0.5 SENSITIVE Sensitive     TRIMETH/SULFA <=10 SENSITIVE Sensitive     VANCOMYCIN <=0.5 SENSITIVE Sensitive     TETRACYCLINE <=1 SENSITIVE Sensitive     *  MODERATE METHICILLIN RESISTANT STAPHYLOCOCCUS AUREUS    Medical History: Past Medical History  Diagnosis Date  . COPD (chronic obstructive pulmonary disease)   . CAD (coronary artery disease)   . Hypertension   . Hyperlipidemia   . Osteomyelitis   . Myocardial infarction     "I've had a couple since 2006" (12/26/2013)  . Anginal pain   . Asthma   . Pneumonia     "have had it several times; maybe now too": (12/26/2013)  . Shortness of breath     "all the time" (12/26/2013)  . On home oxygen therapy     "2-3L; 24/7" (12/26/2013)  . GERD (gastroesophageal reflux disease)   . Daily headache   . Stroke     "they say I've had a couple" denies residual on 12/26/2013  . Arthritis     "hands; feet" (12/26/2013)  . Osteomyelitis hip     "right"  . Chronic lower back pain   . Diabetes mellitus without complication   . CHF (congestive heart failure)    Assessment:   Readmitted tonight after discharged to SNF on 05/19/15. Had been in the hospital 5/15-5/30/16.   Pharmacy assisted with antibiotics during that admission for HCAP and RUL abscess.   MRSA in sputum culture 05/17/15.  MIC to Vanc < 0.5.   Blood and urine cultures sent tonight. Sputum culture to be sent.  Abx last admit:   Azithromycin 5/17>>5/19   Rocephin 5/17>>5/19   Oral levaquin 5/19>>05/23    Vancomycin 05/12/2015>>5/29, 5/30>>         5/25: Vanc trough = 24 on 1g Q 8h >> 1250 mg IV Q12h         5/28: Vanc trough = 19 on 1250 mg IV q12h   Cefepime 5/23>>5/28, 5/29>>   Zosyn 5/28>>5/29     Plan on discharge 05/19/15 was for 1 more week of Vanc then Bactrim PO x 10 days.    Vanc 1 gram IV given in ED tonight. Prior regimen on 1250 mg IV q12hrs to resume in am on 6/4.   Zosyn begun in ED with 3.375 gm IV over 30 minutes.   TCTS to see for possible drainage of abscess.  Goal of Therapy:  Vancomycin trough level 15-20 mcg/ml appropriate Zosyn dose for renal function and infection  Plan:   Vancomycin 1250 mg IV q12hrs as  ordered.  Zosyn 3.375 gm IV q8hrs (each infused over 4 hrs)  Follow renal function, culture data, plans and progress.  Dennie Fetters, [place] Pager: [phone] 05/23/2015,11:23 PM

## 2015-05-23 NOTE — Progress Notes (Signed)
CRITICAL VALUE ALERT  Critical value received:  Lactic acid 3.0  Date of notification:  05/23/2015  Time of notification:  2324  Critical value read back:Yes.    Nurse who received alert:  B. Quintella BatonBuckner RN  MD notified (1st page):  On call for [facility]   Time of first page:  2335  Responding MD:  M. Burnadette PeterLynch  Time MD responded:  2339  RN also updated M. Lynch pertaining to patient's recent Troponin of 0.11.  No new orders received.

## 2015-05-23 NOTE — H&P (Addendum)
[facility] History and Physical  Chris ProphetUlysses G Marchetta ZOX:[id number] DOB: 03/23/1951 DOA: 05/23/2015  Referring physician: ED physician PCP: Jeanann LewandowskyJEGEDE, OLUGBEMIGA, MD  Specialists:   Chief Complaint: chest pain and SOB  HPI: [name] is a 64 y.o. male with PMH of hypertension, hyperlipidemia (allergic to Lipitor), diabetes mellitus, GERD, COPD on 2-3 L of oxygen at home, history of stroke, chronic back pain, diastolic congestive heart failure, history of osteomyelitis on right hip, coronary artery disease (post status of stent placement, 2+ 2 +1), recently treated HCAP, currently being treated for right upper lobe abscess, who presents with chest pain and shortness of breath.  Patient was recently hospitalized from 5/15 and 5/30, and treated for HCAP and right upper lobe abscess. He had CT of chest,which showed right upper lobe abscess. Pulmonary team consulted CT surgeon. Per CT surgery, he was not a candidate for surgery due to severe COPD. At the most, a drain could be placed if the abscess was not resolving. Sputum culture grew MRSA, sensitive to vancomycin and Bactrim. He was discharged on IV vancomycin for 1 week, then Bactrim for 10 days. He has been in SNF for rehab and has been compliant to the antibodies and other medications.  He reports that since early morning at about 6:30, he started having chest pain. His chest pain is located in left side of his chest, sharp, 10 out of 10 in severity, radiating to the left arm it is not aggravated or alleviated with any known factors. Has been intermittent, but has never come back to 0 level. His chest pain is not pleuritic. It is not aggravated by deep breath. Patient also reports having mild cough, and worsening shortness of breath. No fever or chills. No tenderness over the calf areas.  Currently patient denies fever, chills, running nose, ear pain, headaches, abdominal pain, diarrhea, constipation, dysuria, urgency, frequency, hematuria, skin  rashes. No unilateral weakness, numbness or tingling sensations. No vision change or hearing loss.  In ED, patient was found to have WBC 13.5, normal temperature, lactate 2.93, troponin 0.11, EKG has T  flattening in V2-V5 and poor R-wave progression, chest x-ray showed a decreased cavity of right upper lobe abscess and interstitial edema. Patient is admitted to inpatient for further evaluation and treatment. Cardiology and thoracic surgeon were consulted by ED.  Where does patient live?    SNF   Can patient participate in ADLs?   None  Review of Systems:   General: no fevers, chills, no changes in body weight, has poor appetite, has fatigue HEENT: no blurry vision, hearing changes or sore throat Pulm: has dyspnea, coughing, wheezing CV: has chest pain, no palpitations Abd: no nausea, vomiting, abdominal pain, diarrhea, constipation GU: no dysuria, burning on urination, increased urinary frequency, hematuria  Ext: has mild leg edema Neuro: no unilateral weakness, numbness, or tingling, no vision change or hearing loss Skin: no rash MSK: No muscle spasm, no deformity, no limitation of range of movement in spin Heme: No easy bruising.  Travel history: No recent long distant travel.  Allergy:  Allergies  Allergen Reactions  . Lipitor [Atorvastatin Calcium] Other (See Comments)    Muscle aches    Past Medical History  Diagnosis Date  . COPD (chronic obstructive pulmonary disease)   . CAD (coronary artery disease)   . Hypertension   . Hyperlipidemia   . Osteomyelitis   . Myocardial infarction     "I've had a couple since 2006" (12/26/2013)  . Anginal pain   .  Asthma   . Pneumonia     "have had it several times; maybe now too": (12/26/2013)  . Shortness of breath     "all the time" (12/26/2013)  . On home oxygen therapy     "2-3L; 24/7" (12/26/2013)  . GERD (gastroesophageal reflux disease)   . Daily headache   . Stroke     "they say I've had a couple" denies residual on 12/26/2013   . Arthritis     "hands; feet" (12/26/2013)  . Osteomyelitis hip     "right"  . Chronic lower back pain   . Diabetes mellitus without complication   . CHF (congestive heart failure)     Past Surgical History  Procedure Laterality Date  . Appendectomy  1963  . Cardiac catheterization  ? 2009; ?2012  . Coronary angioplasty with stent placement  2006; ?2008; ?2010    "2 + 2 + 1"    Social History:  reports that he quit smoking about 7 months ago. His smoking use included Cigarettes. He has a 12.75 pack-year smoking history. He has never used smokeless tobacco. He reports that he does not drink alcohol or use illicit drugs.  Family History:  Family History  Problem Relation Age of Onset  . Coronary artery disease    . Hypertension       Prior to Admission medications   Medication Sig Start Date End Date Taking? Authorizing Provider  acetaminophen (TYLENOL) 325 MG tablet Take 2 tablets (650 mg total) by mouth every 6 (six) hours as needed for mild pain (or Fever >/= 101). 05/19/15  Yes Belkys A Regalado, MD  albuterol (PROVENTIL HFA;VENTOLIN HFA) 108 (90 BASE) MCG/ACT inhaler Inhale 2 puffs into the lungs every 4 (four) hours as needed for wheezing or shortness of breath. 05/19/15  Yes Belkys A Regalado, MD  albuterol (PROVENTIL) (2.5 MG/3ML) 0.083% nebulizer solution Take 3 mLs (2.5 mg total) by nebulization every 6 (six) hours as needed for wheezing or shortness of breath. Dx 496 11/04/14  Yes Shanker Levora Dredge, MD  aspirin 325 MG tablet Take 1 tablet (325 mg total) by mouth every morning. 11/04/14  Yes Shanker Levora Dredge, MD  clopidogrel (PLAVIX) 75 MG tablet Take 1 tablet (75 mg total) by mouth daily. 11/04/14  Yes Shanker Levora Dredge, MD  fentaNYL (DURAGESIC - DOSED MCG/HR) 12 MCG/HR Place 1 patch (12.5 mcg total) onto the skin every 3 (three) days. 05/19/15  Yes Belkys A Regalado, MD  fluticasone (FLONASE) 50 MCG/ACT nasal spray Place 2 sprays into both nostrils 2 (two) times daily.  11/04/14  Yes Shanker Levora Dredge, MD  furosemide (LASIX) 40 MG tablet Take 1 tablet (40 mg total) by mouth daily. 05/19/15  Yes Belkys A Regalado, MD  insulin glargine (LANTUS) 100 UNIT/ML injection Inject 0.1 mLs (10 Units total) into the skin daily. 05/19/15  Yes Belkys A Regalado, MD  ipratropium (ATROVENT) 0.02 % nebulizer solution Take 2.5 mLs (0.5 mg total) by nebulization every 4 (four) hours as needed for wheezing or shortness of breath. 12/27/14  Yes Leslye Peer, MD  metFORMIN (GLUCOPHAGE) 500 MG tablet Take 1 tablet (500 mg total) by mouth 2 (two) times daily with a meal. 11/04/14  Yes Shanker Levora Dredge, MD  metoprolol (LOPRESSOR) 50 MG tablet Take 1 tablet (50 mg total) by mouth 2 (two) times daily. 11/04/14  Yes Shanker Levora Dredge, MD  mometasone-formoterol (DULERA) 100-5 MCG/ACT AERO Inhale 2 puffs into the lungs 2 (two) times daily. 11/20/14  Yes Olugbemiga  Annitta Needs, MD  nitroGLYCERIN (NITROGLYN) 2 % ointment Apply 0.5 inches topically every 6 (six) hours. 05/19/15  Yes Belkys A Regalado, MD  nystatin (MYCOSTATIN) 100000 UNIT/ML suspension Take 5 mLs (500,000 Units total) by mouth 4 (four) times daily. 01/09/14  Yes Belkys A Regalado, MD  pantoprazole (PROTONIX) 40 MG tablet Take 1 tablet (40 mg total) by mouth 2 (two) times daily. Patient taking differently: Take 40 mg by mouth 2 (two) times daily. Patient states it's the 40mg  tablet 05/19/15  Yes Belkys A Regalado, MD  potassium chloride SA (K-DUR,KLOR-CON) 20 MEQ tablet Take 1 tablet (20 mEq total) by mouth daily. 11/04/14  Yes Shanker Levora Dredge, MD  predniSONE (DELTASONE) 10 MG tablet Take 5 tablets daily for 3 days then 3 tablet daily Patient taking differently: Take 10 mg by mouth See admin instructions. Take 5 tablets daily for 3 days then 3 tablet daily 05/19/15  Yes Belkys A Regalado, MD  traMADol (ULTRAM) 50 MG tablet Take 2 tablets (100 mg total) by mouth every 6 (six) hours as needed for moderate pain. 05/19/15  Yes Belkys A Regalado,  MD  vancomycin 1,250 mg in sodium chloride 0.9 % 250 mL Inject 1,250 mg into the vein every 12 (twelve) hours. 05/19/15  Yes Belkys A Regalado, MD  benzonatate (TESSALON) 200 MG capsule Take 1 capsule (200 mg total) by mouth 3 (three) times daily. Patient not taking: Reported on 05/23/2015 05/19/15   Belkys A Regalado, MD  guaiFENesin (ROBITUSSIN) 100 MG/5ML SOLN Take 10 mLs (200 mg total) by mouth every 6 (six) hours. Patient taking differently: Take 10 mLs by mouth every 6 (six) hours as needed for cough or to loosen phlegm.  05/19/15   Alba Cory, MD    Physical Exam: Filed Vitals:   05/23/15 2000 05/23/15 2045 05/23/15 2048 05/23/15 2137  BP: 142/83 132/82    Pulse: 95 95    Temp:   97.7 F (36.5 C)   TempSrc:   Oral   Resp: 16 15    Height:   5\' 11"  (1.803 m)   Weight:   82.555 kg (182 lb)   SpO2: 92% 100%  100%   General: in moderate acute distress HEENT:       Eyes: PERRL, EOMI, no scleral icterus.       ENT: No discharge from the ears and nose, no pharynx injection, no tonsillar enlargement.        Neck: No JVD, no bruit, no mass felt. Heme: No neck lymph node enlargement. Cardiac: S1/S2, RRR, No murmurs, No gallops or rubs. Pulm: has mild wheezing bilaterally, No rales or rubs. Has picc line on Left chest Abd: Soft, nondistended, nontender, no rebound pain, no organomegaly, BS present. Ext: has trace pitting leg edema bilaterally. 2+DP/PT pulse bilaterally. Musculoskeletal: No joint deformities, No joint redness or warmth, no limitation of ROM in spin. Skin: No rashes.  Neuro: Alert, oriented X3, cranial nerves II-XII grossly intact, muscle strength 5/5 in all extremities, sensation to light touch intact.  Psych: Patient is not psychotic, no suicidal or hemocidal ideation.  Labs on Admission:  Basic Metabolic Panel:  Recent Labs Lab 05/18/15 0646 05/23/15 1722  NA 133* 129*  K 3.9 4.5  CL 90* 88*  CO2 37* 30  GLUCOSE 88 136*  BUN 8 10  CREATININE 0.44*  0.60*  CALCIUM 8.3* 8.2*   Liver Function Tests:  Recent Labs Lab 05/23/15 1722  AST 24  ALT 21  ALKPHOS 101  BILITOT 0.5  PROT 5.2*  ALBUMIN 2.4*   No results for input(s): LIPASE, AMYLASE in the last 168 hours. No results for input(s): AMMONIA in the last 168 hours. CBC:  Recent Labs Lab 05/18/15 0646 05/23/15 1722  WBC 7.4 13.5*  NEUTROABS  --  12.9*  HGB 8.8* 8.7*  HCT 28.2* 26.7*  MCV 87.3 86.7  PLT 188 124*   Cardiac Enzymes: No results for input(s): CKTOTAL, CKMB, CKMBINDEX, TROPONINI in the last 168 hours.  BNP (last 3 results)  Recent Labs  05/04/15 2318  BNP 136.0*    ProBNP (last 3 results)  Recent Labs  10/27/14 2311  PROBNP 89.0    CBG:  Recent Labs Lab 05/19/15 0734 05/19/15 1124 05/19/15 1711 05/19/15 1715 05/19/15 1730  GLUCAP 85 272* 54* 58* 92    Radiological Exams on Admission: Dg Chest Port 1 View  05/23/2015   CLINICAL DATA:  Acute chest pain today with chest congestion, shortness of breath, and wheezing. Left arm numbness.  EXAM: PORTABLE CHEST - 1 VIEW  COMPARISON:  05/16/2015, 04/03/2015 and chest CT dated 05/15/2015  FINDINGS: PICC tip just above the cavoatrial junction, unchanged. There are new small Kerley B-lines at the left base and there is new slight atelectasis at the right base. Heart size and pulmonary vascularity are normal. Right lung abscess is smaller than on the prior study. No acute osseous abnormality.  IMPRESSION: 1. Decreased cavitary right upper lobe  lung abscess. 2. Bibasilar atelectasis with possible interstitial edema at the left lung base.   Electronically Signed   By: Francene Boyers M.D.   On: 05/23/2015 17:51    EKG: Independently reviewed.  Abnormal findings:  T-wave flattening in V2 to V3, poor R-wave progression.   Assessment/Plan Principal Problem:   Chest pain Active Problems:   Essential hypertension   Coronary atherosclerosis   Acute on chronic respiratory failure with hypoxia   COPD  (chronic obstructive pulmonary disease)   Respiratory distress   OSA (obstructive sleep apnea)   History of CHF (congestive heart failure)   DM (diabetes mellitus) type II uncontrolled, periph vascular disorder   Esophageal reflux   Lung abscess RUL  Chest pain and CAD: Patient has very significant history of CAD, post status of multiple stent placement.  Currently he has persistent chest pain, it is concerning for ACS. Currently he on aspirin or Plavix at home. His right upper lobe lung abscess may have also contributed partially. Cardiology was consulted by ED. Dr. Silverio Lay consulted Dr. Katrinka Blazing, who recommend holding heparin now.  - will admit to SDU. - will admit to Tele bed  - cycle CE q6 x3 and repeat her EKG in the am  - prn Nitroglycerin, Morphine, aspirin and plavix - No statin since he is allergic to lipitor  - 2d echo - follow up cardiology consult  Acute on chronic respiratory failure with hypoxia, COPD exacerbatin, HCAP and RUL abscess: Patient has mild wheezing on auscultation, indicating mild exacerbation of COPD. Patient was recently treated for HCAP and has right upper lobe abscess. He is septic on admission with elevated lactate and leukocytosis. Though chest x-ray showed decreased the cavity of right upper lobe abscess, still concerning for uncontrolled infection due to sepsis. Thoracic surgeon was consulted by ED for possible draining of abscess. patient is currently on IV vancomycin, will add Zosyn.  -Nebulizers: scheduled Duoneb and prn albuterol -Solu-Medrol 60 mg IV bid -IV vancomycin and Zosyn -Robitusson for cough  -Urine legionella and S. pneumococcal antigen -Follow up blood  culture x2, sputum culture -will get Procalcitonin and trend lactic acid level per sepsis protocol -IVF: received 1L of NS bolus in ED, followed by 75 cc/h (patient has diastolic congestive heart failure, which limits aggressive IV fluid treatment.) -get ABG  Diastolic CHF (congestive heart  failure): 2-D echo 05/07/15 showed EF 60-65% with grade 1 diastolic dysfunction. Patient is on Lasix 40 mg daily. He reports that he already took Lasix today. His volume status is okay on admission. He has trace leg edema. - hold Lasix since patient needs IV fluid for sepsis -check BNP -ASA, metoprol  DM-II: Last A1c 6.8 on 05/07/15. Patient is taking metformin and Lantus 10 units daily at home -will decrease Lantus dose from 10 to 7 units daily -SSI  GERD: -Protonix   DVT ppx: SQ Heparin      Code Status: Full code Family Communication: None at bed side.    Disposition Plan: Admit to inpatient   Date of Service 05/23/2015    Lorretta Harp [facility] Pager [phone]  If 7PM-7AM, please contact night-coverage [email or web address] Password [email or web address] 05/23/2015, 9:40 PM

## 2015-05-23 NOTE — Progress Notes (Signed)
Patient refused CPAP. Informed him to let us know if he changes his mind at any time. RT will continue to monitor.

## 2015-05-23 NOTE — ED Notes (Signed)
[facility] Hx of stemi, lung abcess, Copd.  Picc line left upper arm.  Facility called EMS due to his low SATS 84% on 4 liters of o2.  Bilateral wheezing, ronchi., coughing brown sputum.  Pt had 3 albuteral treatments 2 duonebs, 324 aspirin, 1 nitro that decreased chest from 10 to 7.  Chest pain left chest and arm.  Hx of heart attack, wears fentnly patch for cronic back pain.  116/83, 102p. 98% on RA

## 2015-05-23 NOTE — ED Notes (Signed)
Ordered [facility] Healthy meal tray 1813.

## 2015-05-24 ENCOUNTER — Encounter (HOSPITAL_COMMUNITY): Payer: Self-pay | Admitting: General Practice

## 2015-05-24 ENCOUNTER — Inpatient Hospital Stay (HOSPITAL_COMMUNITY): Payer: Medicare Other

## 2015-05-24 DIAGNOSIS — E876 Hypokalemia: Secondary | ICD-10-CM

## 2015-05-24 DIAGNOSIS — R079 Chest pain, unspecified: Secondary | ICD-10-CM

## 2015-05-24 DIAGNOSIS — D649 Anemia, unspecified: Secondary | ICD-10-CM

## 2015-05-24 DIAGNOSIS — J441 Chronic obstructive pulmonary disease with (acute) exacerbation: Secondary | ICD-10-CM

## 2015-05-24 DIAGNOSIS — J852 Abscess of lung without pneumonia: Secondary | ICD-10-CM

## 2015-05-24 LAB — CBC
HCT: 21.4 % — ABNORMAL LOW (ref 39.0–52.0)
Hemoglobin: 6.9 g/dL — CL (ref 13.0–17.0)
MCH: 28.2 pg (ref 26.0–34.0)
MCHC: 32.2 g/dL (ref 30.0–36.0)
MCV: 87.3 fL (ref 78.0–100.0)
PLATELETS: 178 10*3/uL (ref 150–400)
RBC: 2.45 MIL/uL — AB (ref 4.22–5.81)
RDW: 16.2 % — AB (ref 11.5–15.5)
WBC: 6.9 10*3/uL (ref 4.0–10.5)

## 2015-05-24 LAB — URINE CULTURE
CULTURE: NO GROWTH
Colony Count: NO GROWTH

## 2015-05-24 LAB — STREP PNEUMONIAE URINARY ANTIGEN: Strep Pneumo Urinary Antigen: NEGATIVE

## 2015-05-24 LAB — BASIC METABOLIC PANEL
Anion gap: 8 (ref 5–15)
BUN: 7 mg/dL (ref 6–20)
CHLORIDE: 102 mmol/L (ref 101–111)
CO2: 29 mmol/L (ref 22–32)
Calcium: 7 mg/dL — ABNORMAL LOW (ref 8.9–10.3)
Creatinine, Ser: 0.37 mg/dL — ABNORMAL LOW (ref 0.61–1.24)
GFR calc Af Amer: >60 mL/min (ref >60)
GLUCOSE: 152 mg/dL — AB (ref 65–99)
Potassium: 3.1 mmol/L — ABNORMAL LOW (ref 3.5–5.1)
Sodium: 139 mmol/L (ref 135–145)

## 2015-05-24 LAB — TROPONIN I
TROPONIN I: 0.04 ng/mL — AB (ref <0.031)
Troponin I: 0.08 ng/mL — ABNORMAL HIGH (ref <0.031)
Troponin I: 0.06 ng/mL — ABNORMAL HIGH (ref <0.031)

## 2015-05-24 LAB — GLUCOSE, CAPILLARY
GLUCOSE-CAPILLARY: 175 mg/dL — AB (ref 65–99)
Glucose-Capillary: 137 mg/dL — ABNORMAL HIGH (ref 65–99)
Glucose-Capillary: 165 mg/dL — ABNORMAL HIGH (ref 65–99)
Glucose-Capillary: 177 mg/dL — ABNORMAL HIGH (ref 65–99)

## 2015-05-24 LAB — PREPARE RBC (CROSSMATCH)

## 2015-05-24 LAB — LACTIC ACID, PLASMA: Lactic Acid, Venous: 1.7 mmol/L (ref 0.5–2.0)

## 2015-05-24 LAB — HEMOGLOBIN AND HEMATOCRIT, BLOOD
HCT: 28.6 % — ABNORMAL LOW (ref 39.0–52.0)
Hemoglobin: 9.1 g/dL — ABNORMAL LOW (ref 13.0–17.0)

## 2015-05-24 LAB — BRAIN NATRIURETIC PEPTIDE: B NATRIURETIC PEPTIDE 5: 350.6 pg/mL — AB (ref 0.0–100.0)

## 2015-05-24 MED ORDER — FUROSEMIDE 10 MG/ML IJ SOLN
40.0000 mg | Freq: Once | INTRAMUSCULAR | Status: AC
  Administered 2015-05-24: 40 mg via INTRAVENOUS
  Filled 2015-05-24: qty 4

## 2015-05-24 MED ORDER — SODIUM CHLORIDE 0.9 % IV SOLN
Freq: Once | INTRAVENOUS | Status: AC
Start: 2015-05-24 — End: 2015-05-27
  Administered 2015-05-27: 10:00:00 via INTRAVENOUS

## 2015-05-24 MED ORDER — SODIUM CHLORIDE 0.9 % IV SOLN: Freq: Once | INTRAVENOUS | Status: DC

## 2015-05-24 MED ORDER — POTASSIUM CHLORIDE CRYS ER 20 MEQ PO TBCR
30.0000 meq | EXTENDED_RELEASE_TABLET | ORAL | Status: AC
  Administered 2015-05-24 (×2): 30 meq via ORAL
  Filled 2015-05-24 (×4): qty 1

## 2015-05-24 NOTE — Consult Note (Signed)
Patient ID: [name] MRN: [id number], DOB/AGE: 1951/06/10   Admit date: 05/23/2015   Primary Physician: Jeanann Lewandowsky, MD Primary Cardiologist: Seen By Dr. Antoine Poche in the Past  Pt. Profile:  64 y/o male with CAD, chronic diastolic HF, COPD on chronic O2, HTN, HLD and recent admission for ARF in the setting of HCAP and RUL abscess, readmitted for chest pain and dyspnea.   Problem List  Past Medical History  Diagnosis Date  . COPD (chronic obstructive pulmonary disease)   . CAD (coronary artery disease)   . Hypertension   . Hyperlipidemia   . Myocardial infarction     "I've had a couple since 2006" (12/26/2013)  . Anginal pain   . Asthma   . Shortness of breath     "all the time" (12/26/2013)  . On home oxygen therapy     "2-3L; 24/7" (05/24/2015)  . GERD (gastroesophageal reflux disease)   . Stroke     "they say I've had a couple" denies residual on 12/26/2013  . Osteomyelitis hip "when I was young"    "right"  . CHF (congestive heart failure)   . Pneumonia     "have had it several times"  . OSA on CPAP     "wear mask q now and then" (05/24/2015)  . Type II diabetes mellitus   . History of blood transfusion "couple times"    "related to blood thinners"  . Headache     "probably q other day" (05/24/2015)  . Arthritis     "hands; feet; knees, hips" (05/24/2015)  . Chronic back pain   . Anxiety     Past Surgical History  Procedure Laterality Date  . Appendectomy  1963  . Cardiac catheterization  ? 2009; ?2012  . Coronary angioplasty with stent placement  2006; ?2008; ?2010; 01/2015    "2 + 2 + 1 + 1"     Allergies  Allergies  Allergen Reactions  . Lipitor [Atorvastatin Calcium] Other (See Comments)    Muscle aches    HPI  The patient is a 64 y/o male, followed by Dr. Antoine Poche in the past. He has known CAD. He underwent stenting of his RCA by Dr. Riley Kill in 2005. In Feb. 2008 he presented with a NSTEMI. LHC showed occlusion of the mid circumflex, treated  with PCI utilizing overlapping drug-eluting stents. He was also noted to have residual 30-40% ostial circumflex, 70% ostial ramus intermedius, 40% proximal RCA and 30% ostial PDA stenosis. The previously placed RCA stent was patent w/o ISR. Medical management was elected for the 70% ostial ramus vessel. LV systolic function was normal with an estimated EF of 60%. His other PMH is significant for HTN, HLD, DM, chronic diastolic HF, COPD on chronic home O2 (2-3L baseline), prior CVA and GERD. The patient also reports that he had a LHC and angioplasty done in February of this year at [facility] (no info regarding this in Care Everywhere). He was also recently admitted from 05/04/15-05/19/15 for acute on chronic respiratory failure with hypoxia in the setting of HCAP and RUL lung abscess. He was treated with antibiotics. Sputum culture grew MRSA, sensitive to vancomycin and Bactrim.  He was evaluated by Pulmonology and CT surgery who both determined he was not a candidate for surgery due to his severe COPD - at most, a drain could be placed if the abscess dose not resolve. He was discharged on 5/30 with plans for  IV vanc x 1 week followed by  Bactrim x 10 days. Of note, an echocardiogram was also obtained during his recent admission on 05/07/15, which showed normal LVSF with EF of 60-65%, G1DD and no WMA. There was mild MR and a small, free-flowing, pericardial effusion circumferential to the heart w/o evidence of hemodynamic compromise.   The patient presented back to [facility] on6/3/16 with a complaint of chest pain and dyspnea. In ED, patient was found to have WBC 13.5, afebrile, lactate 2.93, Hgb at 8.7, troponin 0.11, EKG has T flattening in V2-V5 and poor R-wave progression, chest x-ray showed a decreased cavity of right upper lobe abscess and interstitial edema. He was admitted by Internal medicine. Repeat laboratory work today shows WBC back to normal at 6.9 and a troponin with downward trend 0.11-->0.08. He  has had further drops in his Hgb, now  at 6.9. He is also hypokalemic with K at 3.1.   He reports developing severe, resting left sided chest pain radiating to his left arm with associated dyspnea. This occurred night of presentation. 10/10 pain. Felt like a stabbing pain. Felt similar to his prior anginal episodes, but more severe. Non pleuritic. No aggravating or alleviating factors.  Today, he is now CP free.      Home Medications  Prior to Admission medications   Medication Sig Start Date End Date Taking? Authorizing Provider  acetaminophen (TYLENOL) 325 MG tablet Take 2 tablets (650 mg total) by mouth every 6 (six) hours as needed for mild pain (or Fever >/= 101). 05/19/15  Yes Belkys A Regalado, MD  albuterol (PROVENTIL HFA;VENTOLIN HFA) 108 (90 BASE) MCG/ACT inhaler Inhale 2 puffs into the lungs every 4 (four) hours as needed for wheezing or shortness of breath. 05/19/15  Yes Belkys A Regalado, MD  albuterol (PROVENTIL) (2.5 MG/3ML) 0.083% nebulizer solution Take 3 mLs (2.5 mg total) by nebulization every 6 (six) hours as needed for wheezing or shortness of breath. Dx 496 11/04/14  Yes Shanker Levora Dredge, MD  aspirin 325 MG tablet Take 1 tablet (325 mg total) by mouth every morning. 11/04/14  Yes Shanker Levora Dredge, MD  clopidogrel (PLAVIX) 75 MG tablet Take 1 tablet (75 mg total) by mouth daily. 11/04/14  Yes Shanker Levora Dredge, MD  fentaNYL (DURAGESIC - DOSED MCG/HR) 12 MCG/HR Place 1 patch (12.5 mcg total) onto the skin every 3 (three) days. 05/19/15  Yes Belkys A Regalado, MD  fluticasone (FLONASE) 50 MCG/ACT nasal spray Place 2 sprays into both nostrils 2 (two) times daily. 11/04/14  Yes Shanker Levora Dredge, MD  furosemide (LASIX) 40 MG tablet Take 1 tablet (40 mg total) by mouth daily. 05/19/15  Yes Belkys A Regalado, MD  insulin glargine (LANTUS) 100 UNIT/ML injection Inject 0.1 mLs (10 Units total) into the skin daily. 05/19/15  Yes Belkys A Regalado, MD  ipratropium (ATROVENT) 0.02 %  nebulizer solution Take 2.5 mLs (0.5 mg total) by nebulization every 4 (four) hours as needed for wheezing or shortness of breath. 12/27/14  Yes Leslye Peer, MD  metFORMIN (GLUCOPHAGE) 500 MG tablet Take 1 tablet (500 mg total) by mouth 2 (two) times daily with a meal. 11/04/14  Yes Shanker Levora Dredge, MD  metoprolol (LOPRESSOR) 50 MG tablet Take 1 tablet (50 mg total) by mouth 2 (two) times daily. 11/04/14  Yes Shanker Levora Dredge, MD  mometasone-formoterol (DULERA) 100-5 MCG/ACT AERO Inhale 2 puffs into the lungs 2 (two) times daily. 11/20/14  Yes Quentin Angst, MD  nitroGLYCERIN (NITROGLYN) 2 % ointment Apply 0.5 inches topically every 6 (  six) hours. 05/19/15  Yes Belkys A Regalado, MD  nystatin (MYCOSTATIN) 100000 UNIT/ML suspension Take 5 mLs (500,000 Units total) by mouth 4 (four) times daily. 01/09/14  Yes Belkys A Regalado, MD  pantoprazole (PROTONIX) 40 MG tablet Take 1 tablet (40 mg total) by mouth 2 (two) times daily. Patient taking differently: Take 40 mg by mouth 2 (two) times daily. Patient states it's the 40mg  tablet 05/19/15  Yes Belkys A Regalado, MD  potassium chloride SA (K-DUR,KLOR-CON) 20 MEQ tablet Take 1 tablet (20 mEq total) by mouth daily. 11/04/14  Yes Shanker Levora Dredge, MD  predniSONE (DELTASONE) 10 MG tablet Take 5 tablets daily for 3 days then 3 tablet daily Patient taking differently: Take 10 mg by mouth See admin instructions. Take 5 tablets daily for 3 days then 3 tablet daily 05/19/15  Yes Belkys A Regalado, MD  traMADol (ULTRAM) 50 MG tablet Take 2 tablets (100 mg total) by mouth every 6 (six) hours as needed for moderate pain. 05/19/15  Yes Belkys A Regalado, MD  vancomycin 1,250 mg in sodium chloride 0.9 % 250 mL Inject 1,250 mg into the vein every 12 (twelve) hours. 05/19/15  Yes Belkys A Regalado, MD  benzonatate (TESSALON) 200 MG capsule Take 1 capsule (200 mg total) by mouth 3 (three) times daily. Patient not taking: Reported on 05/23/2015 05/19/15   Belkys A Regalado,  MD  guaiFENesin (ROBITUSSIN) 100 MG/5ML SOLN Take 10 mLs (200 mg total) by mouth every 6 (six) hours. Patient taking differently: Take 10 mLs by mouth every 6 (six) hours as needed for cough or to loosen phlegm.  05/19/15   Alba Cory, MD    Family History  Family History  Problem Relation Age of Onset  . Coronary artery disease    . Hypertension     Social History  History   Social History  . Marital Status: Married    Spouse Name: N/A  . Number of Children: N/A  . Years of Education: N/A   Occupational History  . Disabled    Social History Main Topics  . Smoking status: Former Smoker -- 0.25 packs/day for 51 years    Types: Cigarettes    Quit date: 09/27/2014  . Smokeless tobacco: Never Used  . Alcohol Use: No  . Drug Use: No  . Sexual Activity: Not Currently   Other Topics Concern  . Not on file   Social History Narrative     Review of Systems General:  He has significant malaise and fatigueCardiovascular:  No chest pain, dyspnea on exertion, edema, orthopnea, palpitations, paroxysmal nocturnal dyspnea. Dermatological:  He has easy bruisability and significant lesions on his skinRespiratory:  He has significant underlying dyspnea Urologic: No hematuria, dysuria Abdominal:  His appetite has been reduced.  Neurologic:  No visual changes, wkns, changes in mental status. All other systems reviewed and are otherwise negative except as noted above.  Physical Exam  Blood pressure 127/74, pulse 76, temperature 98 F (36.7 C), temperature source Oral, resp. rate 13, height 5\' 11"  (1.803 m), weight 185 lb 11.2 oz (84.233 kg), SpO2 100 %.  General: Obese white male somewhat pale in no acute distress Skin: Multiple petechiae present over chest, significant ecchymoses over her arms Psych: Normal affect. Neuro: Alert and oriented X 3. Moves all extremities spontaneously. HEENT: Normal  Neck: Supple without bruits or JVD. Lungs:  Decreased BS bilaterally c/w  COPD Heart: RRR very distant heart sounds (hard to assess for murmurs). Abdomen: Soft, non-tender, non-distended, BS +  x 4.  Extremities: No clubbing or cyanosis. 1+ pretibial edema bilaterally.  Pulses DP/PT/Radials 2+ and equal bilaterally.  Labs  Troponin [facility](Point of Care Test)  Recent Labs  05/23/15 1728  TROPIPOC 0.11*    Recent Labs  05/23/15 2210 05/24/15 0415  TROPONINI 0.11* 0.08*   Lab Results  Component Value Date   WBC 6.9 05/24/2015   HGB 6.9* 05/24/2015   HCT 21.4* 05/24/2015   MCV 87.3 05/24/2015   PLT 178 05/24/2015     Recent Labs Lab 05/23/15 1722 05/24/15 0415  NA 129* 139  K 4.5 3.1*  CL 88* 102  CO2 30 29  BUN 10 7  CREATININE 0.60* 0.37*  CALCIUM 8.2* 7.0*  PROT 5.2*  --   BILITOT 0.5  --   ALKPHOS 101  --   ALT 21  --   AST 24  --   GLUCOSE 136* 152*   Lab Results  Component Value Date   CHOL 208* 01/10/2014   HDL 92 01/10/2014   LDLCALC 50 01/10/2014   TRIG 329* 01/10/2014   Lab Results  Component Value Date   DDIMER 0.32 12/26/2013      ECG  EKG initially shows sinus rhythm, EKG today shows sinus rhythm and he has now developed anterolateral T wave inversions consistent with ischemia.    ASSESSMENT AND PLAN  Principal Problem:   Chest pain Active Problems:   Essential hypertension   Coronary atherosclerosis   Acute on chronic respiratory failure with hypoxia   COPD (chronic obstructive pulmonary disease)   Respiratory distress   OSA (obstructive sleep apnea)   History of CHF (congestive heart failure)   DM (diabetes mellitus) type II uncontrolled, periph vascular disorder   Esophageal reflux   Lung abscess RUL   Sepsis   1.  Non-STEMI likely due to ischemia percent dictated by severe anemia: patient has known CAD s/p RCA stenting in 2005 and NSTEMI in 2008 secondary to circumflex occlusion, s/p PCI + DES to mid circ, also with residual disease, notably a 70% ostial RI branch elected to be treated medically.  He also notes recent PCI at [facility] in 01/2015 (no records of this in Care Everywhere). Recent 2D echo 5/18 showed normal LVEF at 60-65% w/o WMA. Small effusion also identified. No evidence for hemodynamic compromise. Vital signs are stable. EKG obtained yesterday demonstrated NSR w/o any ischemic abnormalities. Troponin mildly elevated x 2 with downward trend 0.11-->0.08. This is also in the setting of significant anemia with Hgb now at 6.9 (baseline ~8). He reports pain is similar to past angina.  With new T-wave inversions and troponin elevation suspect that this is non-STEMI and likely may have been precipitated by recent anemia.  In addition, he is not a candidate for LHC currerntly due to acute anemia.   Recommend transfusion as Hgb is <7.0. Can consider NST after transfusion to r/o ischemia.  Continue BB. He has reported intolerance to statin therapy.   2. Dsypnea: increased dyspnea beyond baseline. May be secondary to acute anemia. Also have concerns regarding worsening pericardial effusion.  A small effusion was noted on echo 05/07/15 but not hemodynamically significant at that time. VSS. On exam today, he is noted to have very distant heart sounds. This may be secondary to his COPD, however with increased dyspnea would recommend a repeat limited echo to reassess the pericardium.   3. Chronic Diastolic CHF: G1DD note on recent echo. He has increased dyspnea and evidence of 1+ bilateral pretibial edema. Recommend  lasix post transfusion. Monitor fluid status closely.   4.  Significant lung abscess 5.  Severe underlying COPD 6.  Hyperlipidemia with statin intolerance 7.  Chronic diastolic heart failure    recommendations:   Patient is not a candidate for intravenous heparin at the present time.  Would continue intravenous nitroglycerin and continue to cycle troponins.  Transfuse.  Obtain records from recent catheterization at [facility] which included stenting earlier this year.  We'll  follow closely with you.

## 2015-05-24 NOTE — Progress Notes (Signed)
PT Cancellation Note  Patient Details Name: Haig ProphetUlysses G Veley MRN: [id number] DOB: 05/14/1951   Cancelled Treatment:    Reason Eval/Treat Not Completed: Medical issues which prohibited therapy;Patient at procedure or test/unavailable Receiving blood transfusion.  Defer evaluation, will re-attempt later.   Freida BusmanAllen, Adrienna Karis L 05/24/2015, 12:44 PM

## 2015-05-24 NOTE — Progress Notes (Signed)
  Echocardiogram 2D Echocardiogram has been performed.  Nolon RodBrown, Tony 05/24/2015, 1:05 PM

## 2015-05-24 NOTE — Consult Note (Signed)
[address]       [place] [address]             [phone]          CARDIOTHORACIC SURGERY CONSULTATION REPORT  PCP is Jeanann Lewandowsky, MD Referring Provider is Lorretta Harp, MD   Reason for consultation:  LUNG ABSCESS  HPI:  Patient is a 64 yo male with end-stage COPD and chronic respiratory failure recently admitted to the hospital with lung abscess and decompensated COPD.  He was seen in consultation by Dr Donata Clay on 05/15/2015.  The patient is felt not to be a candidate for surgery.  Recommendations made for broad-spectrum antibiotic therapy and percutaneous drainage have not been followed.  The patient was discharged from the hospital on only Vancomycin and Bactrim to treat MRSA that grew from sputum cultures.   He was readmitted to the hospital with left-sided chest pain suspicious for unstable angina, severe anemia, abnormal EKG and elevated Troponin levels, leukocytosis, and lactic acidosis.  Follow up chest CT has not been done.  The patient has not been reevaluated by the Pulmonary Medicine team.  Cardiothoracic surgical consultation has been requested.  Past Medical History  Diagnosis Date  . COPD (chronic obstructive pulmonary disease)   . CAD (coronary artery disease)   . Hypertension   . Hyperlipidemia   . Myocardial infarction     "I've had a couple since 2006" (12/26/2013)  . Anginal pain   . Asthma   . Shortness of breath     "all the time" (12/26/2013)  . On home oxygen therapy     "2-3L; 24/7" (05/24/2015)  . GERD (gastroesophageal reflux disease)   . Stroke     "they say I've had a couple" denies residual on 12/26/2013  . Osteomyelitis hip "when I was young"    "right"  . CHF (congestive heart failure)   . Pneumonia     "have had it several times"  . OSA on CPAP     "wear mask q now and then" (05/24/2015)  . Type II diabetes mellitus   . History of blood transfusion "couple times"    "related to blood thinners"  . Headache    "probably q other day" (05/24/2015)  . Arthritis     "hands; feet; knees, hips" (05/24/2015)  . Chronic back pain   . Anxiety     Past Surgical History  Procedure Laterality Date  . Appendectomy  1963  . Cardiac catheterization  ? 2009; ?2012  . Coronary angioplasty with stent placement  2006; ?2008; ?2010; 01/2015    "2 + 2 + 1 + 1"    Family History  Problem Relation Age of Onset  . Coronary artery disease    . Hypertension      History   Social History  . Marital Status: Married    Spouse Name: N/A  . Number of Children: N/A  . Years of Education: N/A   Occupational History  . Disabled    Social History Main Topics  . Smoking status: Former Smoker -- 0.25 packs/day for 51 years    Types: Cigarettes    Quit date: 09/27/2014  . Smokeless tobacco: Never Used  . Alcohol Use: No  . Drug Use: No  . Sexual Activity: Not Currently   Other Topics Concern  . Not on file   Social History Narrative    Prior to Admission medications   Medication Sig Start Date End Date Taking?  Authorizing Provider  acetaminophen (TYLENOL) 325 MG tablet Take 2 tablets (650 mg total) by mouth every 6 (six) hours as needed for mild pain (or Fever >/= 101). 05/19/15  Yes Belkys A Regalado, MD  albuterol (PROVENTIL HFA;VENTOLIN HFA) 108 (90 BASE) MCG/ACT inhaler Inhale 2 puffs into the lungs every 4 (four) hours as needed for wheezing or shortness of breath. 05/19/15  Yes Belkys A Regalado, MD  albuterol (PROVENTIL) (2.5 MG/3ML) 0.083% nebulizer solution Take 3 mLs (2.5 mg total) by nebulization every 6 (six) hours as needed for wheezing or shortness of breath. Dx 496 11/04/14  Yes Shanker Levora Dredge, MD  aspirin 325 MG tablet Take 1 tablet (325 mg total) by mouth every morning. 11/04/14  Yes Shanker Levora Dredge, MD  clopidogrel (PLAVIX) 75 MG tablet Take 1 tablet (75 mg total) by mouth daily. 11/04/14  Yes Shanker Levora Dredge, MD  fentaNYL (DURAGESIC - DOSED MCG/HR) 12 MCG/HR Place 1 patch (12.5 mcg  total) onto the skin every 3 (three) days. 05/19/15  Yes Belkys A Regalado, MD  fluticasone (FLONASE) 50 MCG/ACT nasal spray Place 2 sprays into both nostrils 2 (two) times daily. 11/04/14  Yes Shanker Levora Dredge, MD  furosemide (LASIX) 40 MG tablet Take 1 tablet (40 mg total) by mouth daily. 05/19/15  Yes Belkys A Regalado, MD  insulin glargine (LANTUS) 100 UNIT/ML injection Inject 0.1 mLs (10 Units total) into the skin daily. 05/19/15  Yes Belkys A Regalado, MD  ipratropium (ATROVENT) 0.02 % nebulizer solution Take 2.5 mLs (0.5 mg total) by nebulization every 4 (four) hours as needed for wheezing or shortness of breath. 12/27/14  Yes Leslye Peer, MD  metFORMIN (GLUCOPHAGE) 500 MG tablet Take 1 tablet (500 mg total) by mouth 2 (two) times daily with a meal. 11/04/14  Yes Shanker Levora Dredge, MD  metoprolol (LOPRESSOR) 50 MG tablet Take 1 tablet (50 mg total) by mouth 2 (two) times daily. 11/04/14  Yes Shanker Levora Dredge, MD  mometasone-formoterol (DULERA) 100-5 MCG/ACT AERO Inhale 2 puffs into the lungs 2 (two) times daily. 11/20/14  Yes Quentin Angst, MD  nitroGLYCERIN (NITROGLYN) 2 % ointment Apply 0.5 inches topically every 6 (six) hours. 05/19/15  Yes Belkys A Regalado, MD  nystatin (MYCOSTATIN) 100000 UNIT/ML suspension Take 5 mLs (500,000 Units total) by mouth 4 (four) times daily. 01/09/14  Yes Belkys A Regalado, MD  pantoprazole (PROTONIX) 40 MG tablet Take 1 tablet (40 mg total) by mouth 2 (two) times daily. Patient taking differently: Take 40 mg by mouth 2 (two) times daily. Patient states it's the 40mg  tablet 05/19/15  Yes Belkys A Regalado, MD  potassium chloride SA (K-DUR,KLOR-CON) 20 MEQ tablet Take 1 tablet (20 mEq total) by mouth daily. 11/04/14  Yes Shanker Levora Dredge, MD  predniSONE (DELTASONE) 10 MG tablet Take 5 tablets daily for 3 days then 3 tablet daily Patient taking differently: Take 10 mg by mouth See admin instructions. Take 5 tablets daily for 3 days then 3 tablet daily 05/19/15   Yes Belkys A Regalado, MD  traMADol (ULTRAM) 50 MG tablet Take 2 tablets (100 mg total) by mouth every 6 (six) hours as needed for moderate pain. 05/19/15  Yes Belkys A Regalado, MD  vancomycin 1,250 mg in sodium chloride 0.9 % 250 mL Inject 1,250 mg into the vein every 12 (twelve) hours. 05/19/15  Yes Belkys A Regalado, MD  benzonatate (TESSALON) 200 MG capsule Take 1 capsule (200 mg total) by mouth 3 (three) times daily. Patient not taking:  Reported on 05/23/2015 05/19/15   Belkys A Regalado, MD  guaiFENesin (ROBITUSSIN) 100 MG/5ML SOLN Take 10 mLs (200 mg total) by mouth every 6 (six) hours. Patient taking differently: Take 10 mLs by mouth every 6 (six) hours as needed for cough or to loosen phlegm.  05/19/15   Alba Cory, MD    Current Facility-Administered Medications  Medication Dose Route Frequency Provider Last Rate Last Dose  . 0.9 %  sodium chloride infusion   Intravenous Continuous Lorretta Harp, MD 75 mL/hr at 05/24/15 0418    . 0.9 %  sodium chloride infusion   Intravenous Once Jinger Neighbors, NP      . 0.9 %  sodium chloride infusion   Intravenous Once Jinger Neighbors, NP      . acetaminophen (TYLENOL) tablet 650 mg  650 mg Oral Q6H PRN Lorretta Harp, MD      . albuterol (PROVENTIL) (2.5 MG/3ML) 0.083% nebulizer solution 2.5 mg  2.5 mg Nebulization Q2H PRN Lorretta Harp, MD      . alum & mag hydroxide-simeth (MAALOX/MYLANTA) 200-200-20 MG/5ML suspension 30 mL  30 mL Oral Q6H PRN Lorretta Harp, MD      . aspirin tablet 325 mg  325 mg Oral Daily Lorretta Harp, MD   325 mg at 05/23/15 1943  . clopidogrel (PLAVIX) tablet 75 mg  75 mg Oral Daily Lorretta Harp, MD   75 mg at 05/23/15 2114  . [START ON 05/25/2015] fentaNYL (DURAGESIC - dosed mcg/hr) 12.5 mcg  12.5 mcg Transdermal Q72H Lorretta Harp, MD      . fluticasone [facility]) 50 MCG/ACT nasal spray 2 spray  2 spray Each Nare BID Lorretta Harp, MD   2 spray at 05/23/15 2131  . guaiFENesin (ROBITUSSIN) 100 MG/5ML solution 200 mg  10 mL Oral Q6H PRN Lorretta Harp, MD      .  heparin injection 5,000 Units  5,000 Units Subcutaneous 3 times per day Lorretta Harp, MD   5,000 Units at 05/23/15 2122  . insulin aspart (novoLOG) injection 0-9 Units  0-9 Units Subcutaneous TID WC Lorretta Harp, MD      . insulin glargine (LANTUS) injection 7 Units  7 Units Subcutaneous Daily Lorretta Harp, MD   7 Units at 05/23/15 2143  . ipratropium-albuterol (DUONEB) 0.5-2.5 (3) MG/3ML nebulizer solution 3 mL  3 mL Nebulization Q4H Lorretta Harp, MD   3 mL at 05/24/15 0414  . methylPREDNISolone sodium succinate (SOLU-MEDROL) 125 mg/2 mL injection 60 mg  60 mg Intravenous Q12H Lorretta Harp, MD   60 mg at 05/24/15 0121  . metoprolol tartrate (LOPRESSOR) tablet 50 mg  50 mg Oral BID Lorretta Harp, MD   50 mg at 05/23/15 2122  . morphine 2 MG/ML injection 2 mg  2 mg Intravenous Q2H PRN Lorretta Harp, MD   2 mg at 05/24/15 [phone]  . nitroGLYCERIN 50 mg in dextrose 5 % 250 mL (0.2 mg/mL) infusion  0-200 mcg/min Intravenous Titrated Lorretta Harp, MD 3 mL/hr at 05/23/15 2018 10 mcg/min at 05/23/15 2018  . nystatin (MYCOSTATIN) 100000 UNIT/ML suspension 500,000 Units  5 mL Oral QID Lorretta Harp, MD   500,000 Units at 05/23/15 2122  . ondansetron (ZOFRAN) tablet 4 mg  4 mg Oral Q6H PRN Lorretta Harp, MD       Or  . ondansetron [facility]) injection 4 mg  4 mg Intravenous Q6H PRN Lorretta Harp, MD      . pantoprazole (PROTONIX) EC tablet 40 mg  40 mg Oral BID Brien Few  Clyde Lundborg, MD   40 mg at 05/23/15 2121  . piperacillin-tazobactam (ZOSYN) IVPB 3.375 g  3.375 g Intravenous 3 times per day Scarlett Presto, RPH   3.375 g at 05/24/15 0981  . potassium chloride (K-DUR,KLOR-CON) CR tablet 30 mEq  30 mEq Oral Q4H Jinger Neighbors, NP   30 mEq at 05/24/15 0709  . sodium chloride 0.9 % injection 3 mL  3 mL Intravenous Q12H Lorretta Harp, MD   3 mL at 05/23/15 2132  . traMADol (ULTRAM) tablet 100 mg  100 mg Oral Q6H PRN Lorretta Harp, MD   100 mg at 05/24/15 0436  . vancomycin (VANCOCIN) 1,250 mg in sodium chloride 0.9 % 250 mL IVPB  1,250 mg Intravenous Q12H Lorretta Harp, MD          Allergies  Allergen Reactions  . Lipitor [Atorvastatin Calcium] Other (See Comments)    Muscle aches      Review of Systems:  Admission H+P reviewed.  No pertinent data to add.     Physical Exam:   BP 127/74 mmHg  Pulse 76  Temp(Src) 97.6 F (36.4 C) (Oral)  Resp 13  Ht 5\' 11"  (1.803 m)  Wt 84.233 kg (185 lb 11.2 oz)  BMI 25.91 kg/m2  SpO2 100%  General:  Chronically ill-appearing  HEENT:  Unremarkable   Neck:   no JVD, no bruits, no adenopathy   Chest:   Coarse but symmetrical breath sounds, no wheezes, scattered rhonchi  CV:   RRR, no murmur   Abdomen:  soft, non-tender, no masses   Extremities:  warm, well-perfused, pulses not palpable, no lower extremity edema  Rectal/GU  Deferred  Neuro:   Grossly non-focal and symmetrical throughout  Skin:   Clean and dry, no rashes, no breakdown  Diagnostic Tests:  CT CHEST WITH CONTRAST  TECHNIQUE: Multidetector CT imaging of the chest was performed during intravenous contrast administration.  CONTRAST: 75mL OMNIPAQUE IOHEXOL 300 MG/ML SOLN  COMPARISON: Chest radiograph dated 05/15/2015. CTA chest dated 03/20/2015.  FINDINGS: Mediastinum/Nodes: Heart is normal in size. No pericardial effusion.  Coronary atherosclerosis. Postsurgical changes related to prior CABG.  Small mediastinal lymph nodes, including a 9 mm short axis right paratracheal node (series 2/ image 26), likely reactive.  Visualized thyroid is unremarkable.  Lungs/Pleura: 6.7 x 8.5 x 7.7 cm mildly thick-walled cavitary lesion in the anterior/medial right upper lobe (series 3/image 23 with associated air-fluid level and surrounding ground-glass opacity/inflammatory changes. Lesion was not present on prior CT. These findings are most suggestive of pulmonary abscess.  Underlying mild emphysematous changes.  No pleural effusion or pneumothorax.  Upper abdomen: Visualized upper abdomen is notable for  vascular calcifications.  Musculoskeletal: Degenerative changes of the visualized thoracolumbar spine.  Multiple mild to moderate thoracic compression fracture deformities involving the mid to lower thoracic spine, chronic.  IMPRESSION: 8.5 cm cavitary lesion with air-fluid level and surrounding inflammatory changes in the medial right upper lobe, new from prior CT, compatible with pulmonary abscess.  9 mm short axis right paratracheal node, likely reactive.  Consider follow-up CT chest in 4-6 weeks to document improvement/resolution.   Electronically Signed  By: Charline Bills M.D.  On: 05/15/2015 13:56    PORTABLE CHEST - 1 VIEW  COMPARISON: 05/16/2015, 04/03/2015 and chest CT dated 05/15/2015  FINDINGS: PICC tip just above the cavoatrial junction, unchanged. There are new small Kerley B-lines at the left base and there is new slight atelectasis at the right base. Heart size and pulmonary vascularity are normal. Right lung  abscess is smaller than on the prior study. No acute osseous abnormality.  IMPRESSION: 1. Decreased cavitary right upper lobe lung abscess. 2. Bibasilar atelectasis with possible interstitial edema at the left lung base.   Electronically Signed  By: Francene BoyersJames Maxwell M.D.  On: 05/23/2015 17:51    Impression:  Patient has a large cavitary mass in the right upper lobe that is likely chronic and c/w lung abscess in the setting of end-stage COPD.  He is not a candidate for surgical intervention.  Previous recommendations by our service have not been followed, although the patient has now been readmitted to the hospital w/ unstable angina and non-STEMI in the setting of severe anemia with known h/o severe 3-vessel CAD.  Radiographic appearance of the lung abscess by portable CXR performed last night appears stable.    Plan:  I have nothing further to add to the recommendations previously outlined by Dr. Donata ClayVan Trigt.  The  patient's COPD and lung abscess should continue to be managed by the Pulmonary Medicine team and consist of broad-spectrum antibiotics and possible percutaneous drainage.   I spent in excess of 30 minutes during the conduct of this hospital consultation and >50% of this time involved direct face-to-face encounter for counseling and/or coordination of the patient's care.   Salvatore Decentlarence H. Cornelius Moraswen, MD 05/24/2015 7:19 AM

## 2015-05-24 NOTE — Progress Notes (Signed)
PROGRESS NOTE    [name] NWG:[id number] DOB: [birth year] DOA: 05/23/2015 PCP: Jeanann Lewandowsky, MD  HPI/Brief narrative 64 year old male with history of CAD, COPD on home oxygen 3 L/m, chronic diastolic CHF (EF 57-84 percent by echo 5/18), HTN, DM 2, chronic pain, anemia, NSVT, morbid obesity tobacco abuse, recent hospitalization 05/04/15-05/19/15 related to acute on chronic respiratory failure from COPD exacerbation, aspiration pneumonia and RUL abscess. TCTS consulted-not surgical candidate due to severe COPD and recommended drain placement. Eventually pulmonology recommended discharge on IV vancomycin 1 week followed by Bactrim PO 10 days and outpatient follow-up with pulmonology. Patient readmitted on 05/23/15 for left-sided chest pain.   Assessment/Plan:  NSTEMI - Mildly elevated troponin but with downward trend, 0.11 > 0.08 > 0.06 - New EKG changes/anterolateral T-wave inversions compared to yesterday - Repeat 2-D echo 6/4: LVEF 45-50 percent, hypokinesis of the inferior myocardium and grade 1 diastolic dysfunction and no pericardial effusion. EF has worsened since echo 5/18 - Likely precipitated by severe anemia in the context of underlying CAD - Transfuse to keep hemoglobin >8 g per DL. - Continue aspirin, Plavix and beta blockers. Intolerant to statins. No IV heparin secondary to severe anemia. Continue IV NTG - Cardiology consultation appreciated: Not candidate for LHC currently due to acute anemia but may consider NST after transfusion - No anginal type of chest pain. - Could consider cath post transfusion?  Acute on chronic anemia - Hemoglobin lately has been in the 8 g per DL range. - Hb dropped from 8.7 > 6.9 in 12 hours in the absence of overt bleeding or melena.? Accurate - Transfuse 1 unit of PRBC - Follow posttransfusion CBC and transfuse if hemoglobin less than 9 g per DL.  Mild acute on chronic combined diastolic/new systolic CHF - Lasix posttransfusion and  monitor  Severe COPD with exacerbation/chronic respiratory failure on home oxygen 3 L/m - Continue oxygen and bronchodilators - Continue Solu-Medrol and antibiotics.  RUL lung abscess - As per chest x-ray, decreased cavitatory RUL abscess - Continue broad-spectrum IV antibiotics - Per thoracic surgery: Nothing for them to offer at this time. - Will request pulmonology to see in a.m.  Type II DM  - Continue low-dose Lantus and SSI   GERD  - Continue PPI   Hypokalemia - Replace and follow  Chronic pain - Currently controlled   Code Status: Full Family Communication: None at bedside Disposition Plan: DC to SNF when medically stable   Consultants:  2-D echo 05/24/15: Study Conclusions  - Left ventricle: The cavity size was mildly dilated. Systolic function was mildly reduced. The estimated ejection fraction was in the range of 45% to 50%. There is hypokinesis of the inferior myocardium. Doppler parameters are consistent with abnormal left ventricular relaxation (grade 1 diastolic dysfunction). - Aortic valve: Trileaflet; mildly thickened, moderately calcified leaflets. Transvalvular velocity was minimally increased. There was no stenosis. There was mild regurgitation. Valve area (Vmax): 2.18 cm^2.  Impressions:  - Compared to the prior study, there has been no significant interval change.   Procedures:  None  Antibiotics:  IV vancomycin 05/23/15 >  IV Zosyn 05/23/15 >   Subjective: Precordial chest pain feels much better-just left with soreness now. Dyspnea improved.  Objective: Filed Vitals:   05/24/15 1415 05/24/15 1533 05/24/15 1556 05/24/15 1619  BP: 141/77     Pulse: 78     Temp: 98.7 F (37.1 C) 97.8 F (36.6 C)  98.3 F (36.8 C)  TempSrc:  Oral  Oral  Resp: 13  Height:      Weight:      SpO2: 99%  99%     Intake/Output Summary (Last 24 hours) at 05/24/15 1802 Last data filed at 05/24/15 1500  Gross per 24 hour  Intake     728 ml  Output   2200 ml  Net  -1472 ml   Filed Weights   05/23/15 1653 05/23/15 2048 05/24/15 0431  Weight: 84.823 kg (187 lb) 82.555 kg (182 lb) 84.233 kg (185 lb 11.2 oz)     Exam:  General exam: Moderately built and nourished middle-aged male sitting comfortably propped up in bed  Respiratory system: Globally diminished breath sounds but without wheezing, rhonchi. Occasional basal crackles. No increased work of breathing. No reproducible chest wall tenderness  Cardiovascular system: S1 & S2 heard, RRR. No JVD, murmurs, gallops, clicks. Trace bilateral ankle edema Gastrointestinal system: Abdomen is obese/nondistended, soft and nontender. Normal bowel sounds heard. Central nervous system: Alert and oriented. No focal neurological deficits. Extremities: Symmetric 5 x 5 power.   Data Reviewed: Basic Metabolic Panel:  Recent Labs Lab 05/18/15 0646 05/23/15 1722 05/24/15 0415  NA 133* 129* 139  K 3.9 4.5 3.1*  CL 90* 88* 102  CO2 37* 30 29  GLUCOSE 88 136* 152*  BUN 8 10 7   CREATININE 0.44* 0.60* 0.37*  CALCIUM 8.3* 8.2* 7.0*   Liver Function Tests:  Recent Labs Lab 05/23/15 1722  AST 24  ALT 21  ALKPHOS 101  BILITOT 0.5  PROT 5.2*  ALBUMIN 2.4*   No results for input(s): LIPASE, AMYLASE in the last 168 hours. No results for input(s): AMMONIA in the last 168 hours. CBC:  Recent Labs Lab 05/18/15 0646 05/23/15 1722 05/24/15 0415 05/24/15 1610  WBC 7.4 13.5* 6.9  --   NEUTROABS  --  12.9*  --   --   HGB 8.8* 8.7* 6.9* 9.1*  HCT 28.2* 26.7* 21.4* 28.6*  MCV 87.3 86.7 87.3  --   PLT 188 124* 178  --    Cardiac Enzymes:  Recent Labs Lab 05/23/15 2210 05/24/15 0415 05/24/15 1255  TROPONINI 0.11* 0.08* 0.06*   BNP (last 3 results)  Recent Labs  10/27/14 2311  PROBNP 89.0   CBG:  Recent Labs Lab 05/19/15 1730 05/23/15 2215 05/24/15 0731 05/24/15 1129 05/24/15 1616  GLUCAP 92 222* 175* 137* 165*    Recent Results (from the past 240  hour(s))  Culture, sputum-assessment     Status: None   Collection Time: 05/17/15  5:10 AM  Result Value Ref Range Status   Specimen Description SPUTUM  Final   Special Requests NONE  Final   Sputum evaluation   Final    THIS SPECIMEN IS ACCEPTABLE. RESPIRATORY CULTURE REPORT TO FOLLOW.   Report Status 05/17/2015 FINAL  Final  Culture, respiratory (NON-Expectorated)     Status: None   Collection Time: 05/17/15  5:10 AM  Result Value Ref Range Status   Specimen Description SPUTUM  Final   Special Requests NONE  Final   Gram Stain   Final    ABUNDANT WBC PRESENT,BOTH PMN AND MONONUCLEAR RARE SQUAMOUS EPITHELIAL CELLS PRESENT FEW GRAM POSITIVE COCCI IN PAIRS IN CLUSTERS Gram Stain Report Called to,Read Back By and Verified With: Gram Stain Report Called to,Read Back By and Verified With: RICKY.O AT 0657 ON [id number] BY WEBSP Performed at [facility]    Culture   Final    MODERATE METHICILLIN RESISTANT STAPHYLOCOCCUS AUREUS Note: RIFAMPIN AND GENTAMICIN SHOULD NOT BE USED  AS SINGLE DRUGS FOR TREATMENT OF STAPH INFECTIONS. Performed at [facility]    Report Status 05/19/2015 FINAL  Final   Organism ID, Bacteria METHICILLIN RESISTANT STAPHYLOCOCCUS AUREUS  Final      Susceptibility   Methicillin resistant staphylococcus aureus - MIC*    CLINDAMYCIN >=8 RESISTANT Resistant     ERYTHROMYCIN >=8 RESISTANT Resistant     GENTAMICIN <=0.5 SENSITIVE Sensitive     LEVOFLOXACIN >=8 RESISTANT Resistant     OXACILLIN >=4 RESISTANT Resistant     PENICILLIN >=0.5 RESISTANT Resistant     RIFAMPIN <=0.5 SENSITIVE Sensitive     TRIMETH/SULFA <=10 SENSITIVE Sensitive     VANCOMYCIN <=0.5 SENSITIVE Sensitive     TETRACYCLINE <=1 SENSITIVE Sensitive     * MODERATE METHICILLIN RESISTANT STAPHYLOCOCCUS AUREUS         Studies: Dg Chest Port 1 View  05/23/2015   CLINICAL DATA:  Acute chest pain today with chest congestion, shortness of breath, and wheezing. Left arm numbness.   EXAM: PORTABLE CHEST - 1 VIEW  COMPARISON:  05/16/2015, 04/03/2015 and chest CT dated 05/15/2015  FINDINGS: PICC tip just above the cavoatrial junction, unchanged. There are new small Kerley B-lines at the left base and there is new slight atelectasis at the right base. Heart size and pulmonary vascularity are normal. Right lung abscess is smaller than on the prior study. No acute osseous abnormality.  IMPRESSION: 1. Decreased cavitary right upper lobe  lung abscess. 2. Bibasilar atelectasis with possible interstitial edema at the left lung base.   Electronically Signed   By: Francene Boyers M.D.   On: 05/23/2015 17:51        Scheduled Meds: . sodium chloride   Intravenous Once  . sodium chloride   Intravenous Once  . aspirin  325 mg Oral Daily  . clopidogrel  75 mg Oral Daily  . [START ON 05/25/2015] fentaNYL  12.5 mcg Transdermal Q72H  . fluticasone  2 spray Each Nare BID  . heparin  5,000 Units Subcutaneous 3 times per day  . insulin aspart  0-9 Units Subcutaneous TID WC  . insulin glargine  7 Units Subcutaneous Daily  . ipratropium-albuterol  3 mL Nebulization Q4H  . methylPREDNISolone (SOLU-MEDROL) injection  60 mg Intravenous Q12H  . metoprolol  50 mg Oral BID  . nystatin  5 mL Oral QID  . pantoprazole  40 mg Oral BID  . piperacillin-tazobactam (ZOSYN)  IV  3.375 g Intravenous 3 times per day  . sodium chloride  3 mL Intravenous Q12H  . vancomycin (VANCOCIN) 1250 mg IVPB  1,250 mg Intravenous Q12H   Continuous Infusions: . nitroGLYCERIN 10 mcg/min (05/23/15 2018)    Principal Problem:   Chest pain Active Problems:   Essential hypertension   Coronary atherosclerosis   Acute on chronic respiratory failure with hypoxia   COPD (chronic obstructive pulmonary disease)   Respiratory distress   OSA (obstructive sleep apnea)   History of CHF (congestive heart failure)   DM (diabetes mellitus) type II uncontrolled, periph vascular disorder   Esophageal reflux   Lung abscess RUL    Sepsis    Time spent: 45 minutes    Daivik Overley, MD, FACP, FHM. [facility] Pager [phone]  If 7PM-7AM, please contact night-coverage [email or web address] Password [email or web address] 05/24/2015, 6:02 PM    LOS: 1 day

## 2015-05-24 NOTE — Procedures (Signed)
Pt refuses CPAP 

## 2015-05-24 NOTE — Progress Notes (Addendum)
PROGRESS NOTE    Chris ProphetUlysses G Keefe ZOX:[id number] DOB: 12/20/1950 DOA: 05/23/2015 PCP: Jeanann LewandowskyJEGEDE, OLUGBEMIGA, MD  HPI/Brief narrative 64 year old male with history of CAD, COPD on home oxygen 3 L/m, chronic diastolic CHF (EF 81-1960-65 percent by echo 5/18), HTN, DM 2, chronic pain, anemia, NSVT, morbid obesity tobacco abuse, recent hospitalization 05/04/15-05/19/15 related to acute on chronic respiratory failure from COPD exacerbation, aspiration pneumonia and RUL abscess. TCTS consulted-not surgical candidate due to severe COPD and recommended drain placement. Eventually pulmonology recommended discharge on IV vancomycin 1 week followed by Bactrim PO 10 days and outpatient follow-up with pulmonology. Patient readmitted on 05/23/15 for left-sided chest pain.   Assessment/Plan:  NSTEMI - Mildly elevated troponin but with downward trend, 0.11 > 0.08 > 0.06 - New EKG changes/anterolateral T-wave inversions compared to yesterday - Repeat 2-D echo 6/4: LVEF 45-50 percent, hypokinesis of the inferior myocardium and grade 1 diastolic dysfunction and no pericardial effusion. EF has worsened since echo 5/18 - Likely precipitated by severe anemia in the context of underlying CAD - Transfuse to keep hemoglobin >9 g per DL. - Continue aspirin, Plavix and beta blockers. Intolerant to statins. No IV heparin secondary to severe anemia. Continue IV NTG - Cardiology consultation appreciated: Not candidate for LHC currently due to acute anemia but may consider NST after transfusion - No anginal type of chest pain. - Could consider cath post transfusion?  Acute on chronic anemia - Hemoglobin lately has been in the 8 g per DL range. - Hb dropped from 8.7 > 6.9 in 12 hours in the absence of overt bleeding or melena.? Accurate - Transfuse 1 unit of PRBC - Follow posttransfusion CBC and transfuse if hemoglobin less than 9 g per DL.  Mild acute on chronic combined diastolic/new systolic CHF - Lasix posttransfusion and  monitor  Severe COPD with exacerbation/chronic respiratory failure on home oxygen 3 L/m - Continue oxygen and bronchodilators - Continue Solu-Medrol and antibiotics.  RUL lung abscess - As per chest x-ray, decreased cavitatory RUL abscess - Continue broad-spectrum IV antibiotics - Per thoracic surgery: Nothing for them to offer at this time. - Will request pulmonology to see in a.m.  Type II DM  - Continue low-dose Lantus and SSI   GERD  - Continue PPI   Hypokalemia - Replace and follow  Chronic pain - Currently controlled   Code Status: Full Family Communication: None at bedside Disposition Plan: DC to SNF when medically stable   Consultants:  2-D echo 05/24/15: Study Conclusions  - Left ventricle: The cavity size was mildly dilated. Systolic function was mildly reduced. The estimated ejection fraction was in the range of 45% to 50%. There is hypokinesis of the inferior myocardium. Doppler parameters are consistent with abnormal left ventricular relaxation (grade 1 diastolic dysfunction). - Aortic valve: Trileaflet; mildly thickened, moderately calcified leaflets. Transvalvular velocity was minimally increased. There was no stenosis. There was mild regurgitation. Valve area (Vmax): 2.18 cm^2.  Impressions:  - Compared to the prior study, there has been no significant interval change.   Procedures:  None  Antibiotics:  IV vancomycin 05/23/15 >  IV Zosyn 05/23/15 >   Subjective: Precordial chest pain feels much better-just left with soreness now. Dyspnea improved.  Objective: Filed Vitals:   05/24/15 1133 05/24/15 1237 05/24/15 1249 05/24/15 1415  BP:   174/99 141/77  Pulse:   85 78  Temp: 98 F (36.7 C)  98.1 F (36.7 C) 98.7 F (37.1 C)  TempSrc: Oral  Oral   Resp:  20 13  Height:      Weight:      SpO2:  98% 99% 99%    Intake/Output Summary (Last 24 hours) at 05/24/15 1523 Last data filed at 05/24/15 1441  Gross per 24 hour   Intake    728 ml  Output   2100 ml  Net  -1372 ml   Filed Weights   05/23/15 1653 05/23/15 2048 05/24/15 0431  Weight: 84.823 kg (187 lb) 82.555 kg (182 lb) 84.233 kg (185 lb 11.2 oz)     Exam:  General exam: Moderately built and nourished middle-aged male sitting comfortably propped up in bed  Respiratory system: Globally diminished breath sounds but without wheezing, rhonchi. Occasional basal crackles. No increased work of breathing. No reproducible chest wall tenderness  Cardiovascular system: S1 & S2 heard, RRR. No JVD, murmurs, gallops, clicks. Trace bilateral ankle edema Gastrointestinal system: Abdomen is obese/nondistended, soft and nontender. Normal bowel sounds heard. Central nervous system: Alert and oriented. No focal neurological deficits. Extremities: Symmetric 5 x 5 power.   Data Reviewed: Basic Metabolic Panel:  Recent Labs Lab 05/18/15 0646 05/23/15 1722 05/24/15 0415  NA 133* 129* 139  K 3.9 4.5 3.1*  CL 90* 88* 102  CO2 37* 30 29  GLUCOSE 88 136* 152*  BUN CREATININE 0.44* 0.60* 0.37*  CALCIUM 8.3* 8.2* 7.0*   Liver Function Tests:  Recent Labs Lab 05/23/15 1722  AST 24  ALT 21  ALKPHOS 101  BILITOT 0.5  PROT 5.2*  ALBUMIN 2.4*   No results for input(s): LIPASE, AMYLASE in the last 168 hours. No results for input(s): AMMONIA in the last 168 hours. CBC:  Recent Labs Lab 05/18/15 0646 05/23/15 1722 05/24/15 0415  WBC 7.4 13.5* 6.9  NEUTROABS  --  12.9*  --   HGB 8.8* 8.7* 6.9*  HCT 28.2* 26.7* 21.4*  MCV 87.3 86.7 87.3  PLT 188 124* 178   Cardiac Enzymes:  Recent Labs Lab 05/23/15 2210 05/24/15 0415 05/24/15 1255  TROPONINI 0.11* 0.08* 0.06*   BNP (last 3 results)  Recent Labs  10/27/14 2311  PROBNP 89.0   CBG:  Recent Labs Lab 05/19/15 1715 05/19/15 1730 05/23/15 2215 05/24/15 0731 05/24/15 1129  GLUCAP 58* 92 222* 175* 137*    Recent Results (from the past 240 hour(s))  Culture,  sputum-assessment     Status: None   Collection Time: 05/17/15  5:10 AM  Result Value Ref Range Status   Specimen Description SPUTUM  Final   Special Requests NONE  Final   Sputum evaluation   Final    THIS SPECIMEN IS ACCEPTABLE. RESPIRATORY CULTURE REPORT TO FOLLOW.   Report Status 05/17/2015 FINAL  Final  Culture, respiratory (NON-Expectorated)     Status: None   Collection Time: 05/17/15  5:10 AM  Result Value Ref Range Status   Specimen Description SPUTUM  Final   Special Requests NONE  Final   Gram Stain   Final    ABUNDANT WBC PRESENT,BOTH PMN AND MONONUCLEAR RARE SQUAMOUS EPITHELIAL CELLS PRESENT FEW GRAM POSITIVE COCCI IN PAIRS IN CLUSTERS Gram Stain Report Called to,Read Back By and Verified With: Gram Stain Report Called to,Read Back By and Verified With: RICKY.O AT 0657 ON [id number] BY WEBSP Performed at [facility]    Culture   Final    MODERATE METHICILLIN RESISTANT STAPHYLOCOCCUS AUREUS Note: RIFAMPIN AND GENTAMICIN SHOULD NOT BE USED AS SINGLE DRUGS FOR TREATMENT OF STAPH INFECTIONS. Performed at [facility]  Report Status 05/19/2015 FINAL  Final   Organism ID, Bacteria METHICILLIN RESISTANT STAPHYLOCOCCUS AUREUS  Final      Susceptibility   Methicillin resistant staphylococcus aureus - MIC*    CLINDAMYCIN >=8 RESISTANT Resistant     ERYTHROMYCIN >=8 RESISTANT Resistant     GENTAMICIN <=0.5 SENSITIVE Sensitive     LEVOFLOXACIN >=8 RESISTANT Resistant     OXACILLIN >=4 RESISTANT Resistant     PENICILLIN >=0.5 RESISTANT Resistant     RIFAMPIN <=0.5 SENSITIVE Sensitive     TRIMETH/SULFA <=10 SENSITIVE Sensitive     VANCOMYCIN <=0.5 SENSITIVE Sensitive     TETRACYCLINE <=1 SENSITIVE Sensitive     * MODERATE METHICILLIN RESISTANT STAPHYLOCOCCUS AUREUS         Studies: Dg Chest Port 1 View  05/23/2015   CLINICAL DATA:  Acute chest pain today with chest congestion, shortness of breath, and wheezing. Left arm numbness.  EXAM: PORTABLE CHEST -  1 VIEW  COMPARISON:  05/16/2015, 04/03/2015 and chest CT dated 05/15/2015  FINDINGS: PICC tip just above the cavoatrial junction, unchanged. There are new small Kerley B-lines at the left base and there is new slight atelectasis at the right base. Heart size and pulmonary vascularity are normal. Right lung abscess is smaller than on the prior study. No acute osseous abnormality.  IMPRESSION: 1. Decreased cavitary right upper lobe  lung abscess. 2. Bibasilar atelectasis with possible interstitial edema at the left lung base.   Electronically Signed   By: Francene Boyers M.D.   On: 05/23/2015 17:51        Scheduled Meds: . sodium chloride   Intravenous Once  . sodium chloride   Intravenous Once  . aspirin  325 mg Oral Daily  . clopidogrel  75 mg Oral Daily  . [START ON 05/25/2015] fentaNYL  12.5 mcg Transdermal Q72H  . fluticasone  2 spray Each Nare BID  . heparin  5,000 Units Subcutaneous 3 times per day  . insulin aspart  0-9 Units Subcutaneous TID WC  . insulin glargine  7 Units Subcutaneous Daily  . ipratropium-albuterol  3 mL Nebulization Q4H  . methylPREDNISolone (SOLU-MEDROL) injection  60 mg Intravenous Q12H  . metoprolol  50 mg Oral BID  . nystatin  5 mL Oral QID  . pantoprazole  40 mg Oral BID  . piperacillin-tazobactam (ZOSYN)  IV  3.375 g Intravenous 3 times per day  . sodium chloride  3 mL Intravenous Q12H  . vancomycin (VANCOCIN) 1250 mg IVPB  1,250 mg Intravenous Q12H   Continuous Infusions: . sodium chloride 75 mL/hr at 05/24/15 0418  . nitroGLYCERIN 10 mcg/min (05/23/15 2018)    Principal Problem:   Chest pain Active Problems:   Essential hypertension   Coronary atherosclerosis   Acute on chronic respiratory failure with hypoxia   COPD (chronic obstructive pulmonary disease)   Respiratory distress   OSA (obstructive sleep apnea)   History of CHF (congestive heart failure)   DM (diabetes mellitus) type II uncontrolled, periph vascular disorder   Esophageal  reflux   Lung abscess RUL   Sepsis    Time spent: 45 minutes    Leisha Trinkle, MD, FACP, FHM. [facility] Pager [phone]  If 7PM-7AM, please contact night-coverage [email or web address] Password [email or web address] 05/24/2015, 3:23 PM    LOS: 1 day

## 2015-05-25 DIAGNOSIS — I209 Angina pectoris, unspecified: Secondary | ICD-10-CM

## 2015-05-25 DIAGNOSIS — J9612 Chronic respiratory failure with hypercapnia: Secondary | ICD-10-CM

## 2015-05-25 DIAGNOSIS — E1159 Type 2 diabetes mellitus with other circulatory complications: Secondary | ICD-10-CM

## 2015-05-25 LAB — DIFFERENTIAL
BASOS ABS: 0 10*3/uL (ref 0.0–0.1)
BASOS PCT: 0 % (ref 0–1)
Eosinophils Absolute: 0 10*3/uL (ref 0.0–0.7)
Eosinophils Relative: 0 % (ref 0–5)
LYMPHS PCT: 6 % — AB (ref 12–46)
Lymphs Abs: 0.3 10*3/uL — ABNORMAL LOW (ref 0.7–4.0)
Monocytes Absolute: 0.2 10*3/uL (ref 0.1–1.0)
Monocytes Relative: 3 % (ref 3–12)
Neutro Abs: 5.4 10*3/uL (ref 1.7–7.7)
Neutrophils Relative %: 91 % — ABNORMAL HIGH (ref 43–77)

## 2015-05-25 LAB — CBC
HCT: 25.8 % — ABNORMAL LOW (ref 39.0–52.0)
Hemoglobin: 8.3 g/dL — ABNORMAL LOW (ref 13.0–17.0)
MCH: 27.9 pg (ref 26.0–34.0)
MCHC: 32.2 g/dL (ref 30.0–36.0)
MCV: 86.6 fL (ref 78.0–100.0)
PLATELETS: 214 10*3/uL (ref 150–400)
RBC: 2.98 MIL/uL — AB (ref 4.22–5.81)
RDW: 16.2 % — ABNORMAL HIGH (ref 11.5–15.5)
WBC: 6.3 10*3/uL (ref 4.0–10.5)

## 2015-05-25 LAB — TYPE AND SCREEN
ABO/RH(D): A NEG
ANTIBODY SCREEN: NEGATIVE
Unit division: 0
Unit division: 0

## 2015-05-25 LAB — GLUCOSE, CAPILLARY
GLUCOSE-CAPILLARY: 221 mg/dL — AB (ref 65–99)
GLUCOSE-CAPILLARY: 154 mg/dL — AB (ref 65–99)
GLUCOSE-CAPILLARY: 198 mg/dL — AB (ref 65–99)
Glucose-Capillary: 213 mg/dL — ABNORMAL HIGH (ref 65–99)

## 2015-05-25 LAB — BASIC METABOLIC PANEL
ANION GAP: 11 (ref 5–15)
BUN: 8 mg/dL (ref 6–20)
CO2: 31 mmol/L (ref 22–32)
Calcium: 8.3 mg/dL — ABNORMAL LOW (ref 8.9–10.3)
Chloride: 95 mmol/L — ABNORMAL LOW (ref 101–111)
Creatinine, Ser: 0.63 mg/dL (ref 0.61–1.24)
GFR calc Af Amer: >60 mL/min (ref >60)
GFR calc non Af Amer: >60 mL/min (ref >60)
Glucose, Bld: 285 mg/dL — ABNORMAL HIGH (ref 65–99)
Potassium: 3.9 mmol/L (ref 3.5–5.1)
SODIUM: 137 mmol/L (ref 135–145)

## 2015-05-25 LAB — TROPONIN I
TROPONIN I: 0.03 ng/mL (ref <0.031)
Troponin I: 0.04 ng/mL — ABNORMAL HIGH (ref <0.031)

## 2015-05-25 MED ORDER — BUDESONIDE 0.25 MG/2ML IN SUSP
0.2500 mg | Freq: Four times a day (QID) | RESPIRATORY_TRACT | Status: DC
  Administered 2015-05-25 – 2015-06-03 (×38): 0.25 mg via RESPIRATORY_TRACT
  Filled 2015-05-25 (×39): qty 2

## 2015-05-25 MED ORDER — SODIUM CHLORIDE 0.9 % IV SOLN: 250.0000 mL | INTRAVENOUS | Status: DC | PRN

## 2015-05-25 MED ORDER — SODIUM CHLORIDE 0.9 % WEIGHT BASED INFUSION
3.0000 mL/kg/h | INTRAVENOUS | Status: DC
  Administered 2015-05-26: 3 mL/kg/h via INTRAVENOUS

## 2015-05-25 MED ORDER — SODIUM CHLORIDE 0.9 % WEIGHT BASED INFUSION
1.0000 mL/kg/h | INTRAVENOUS | Status: DC
  Administered 2015-05-26 (×2): 1 mL/kg/h via INTRAVENOUS

## 2015-05-25 MED ORDER — BISOPROLOL FUMARATE 5 MG PO TABS
5.0000 mg | ORAL_TABLET | Freq: Two times a day (BID) | ORAL | Status: DC
  Administered 2015-05-25 – 2015-06-03 (×19): 5 mg via ORAL
  Filled 2015-05-25 (×19): qty 1

## 2015-05-25 MED ORDER — ASPIRIN 81 MG PO CHEW
81.0000 mg | CHEWABLE_TABLET | ORAL | Status: AC
  Administered 2015-05-26: 81 mg via ORAL
  Filled 2015-05-25: qty 1

## 2015-05-25 MED ORDER — PANTOPRAZOLE SODIUM 40 MG PO TBEC
40.0000 mg | DELAYED_RELEASE_TABLET | Freq: Two times a day (BID) | ORAL | Status: DC
  Administered 2015-05-25 – 2015-06-03 (×18): 40 mg via ORAL
  Filled 2015-05-25 (×18): qty 1

## 2015-05-25 MED ORDER — PREDNISONE 20 MG PO TABS
20.0000 mg | ORAL_TABLET | Freq: Every day | ORAL | Status: DC
  Administered 2015-05-25 – 2015-05-29 (×5): 20 mg via ORAL
  Filled 2015-05-25 (×5): qty 1

## 2015-05-25 MED ORDER — IPRATROPIUM-ALBUTEROL 0.5-2.5 (3) MG/3ML IN SOLN
3.0000 mL | Freq: Four times a day (QID) | RESPIRATORY_TRACT | Status: DC
  Administered 2015-05-25 – 2015-05-29 (×16): 3 mL via RESPIRATORY_TRACT
  Filled 2015-05-25 (×16): qty 3

## 2015-05-25 MED ORDER — SODIUM CHLORIDE 0.9 % IJ SOLN: 3.0000 mL | INTRAMUSCULAR | Status: DC | PRN

## 2015-05-25 MED ORDER — INSULIN GLARGINE 100 UNIT/ML ~~LOC~~ SOLN
10.0000 [IU] | Freq: Every day | SUBCUTANEOUS | Status: DC
  Administered 2015-05-27 – 2015-06-01 (×6): 10 [IU] via SUBCUTANEOUS
  Filled 2015-05-25 (×8): qty 0.1

## 2015-05-25 MED ORDER — SODIUM CHLORIDE 0.9 % IJ SOLN: 3.0000 mL | Freq: Two times a day (BID) | INTRAMUSCULAR | Status: DC

## 2015-05-25 NOTE — Progress Notes (Signed)
PROGRESS NOTE    [name]:[id number] DOB: March 01, 1951 DOA: 05/23/2015 PCP: Jeanann Lewandowsky, MD  HPI/Brief narrative 64 year old male with history of CAD, COPD on home oxygen 3 L/m, chronic diastolic CHF (EF 81-19 percent by echo 5/18), HTN, DM 2, chronic pain, anemia, NSVT, morbid obesity tobacco abuse, recent hospitalization 05/04/15-05/19/15 related to acute on chronic respiratory failure from COPD exacerbation, aspiration pneumonia and RUL abscess. TCTS consulted-not surgical candidate due to severe COPD and recommended drain placement. Eventually pulmonology recommended discharge on IV vancomycin 1 week followed by Bactrim PO 10 days and outpatient follow-up with pulmonology. Patient readmitted on 05/23/15 for left-sided chest pain.   Assessment/Plan:  NSTEMI - Mildly elevated troponin but with downward trend, 0.11 > 0.08 > 0.06 >then decreased gradually to normal - New EKG changes/anterolateral T-wave inversions on 6/4 compared to the day prior. - Repeat 2-D echo 6/4: LVEF 45-50 percent, hypokinesis of the inferior myocardium and grade 1 diastolic dysfunction and no pericardial effusion. EF has worsened since echo 5/18 - Likely precipitated by severe anemia in the context of underlying CAD - Transfuse to keep hemoglobin >8 g per DL. - Continue aspirin, Plavix and beta blockers. Intolerant to statins. No IV heparin secondary to severe anemia. Continue IV NTG - Cardiology follow-up appreciated: Discussed with cardiology. Plan for cardiac cath 6/6. - Requesting medical records/cath report from [facility] - No anginal type of chest pain.  Acute on chronic anemia - Hemoglobin lately has been in the 8 g per DL range. - Hb dropped from 8.7 > 6.9 in 12 hours in the absence of overt bleeding or melena.? Accurate - s/p 1 unit of PRBC 6/4 - Improved. Hemoglobin 8.3. Follow CBC in a.m. and transfuse if less than 8 g per DL.  Mild acute on chronic combined  diastolic/new systolic CHF - Lasix posttransfusion and monitor - Improved  Severe COPD (oxygen and steroid dependent) with exacerbation/chronic respiratory failure on home oxygen 3 L/m - Continue oxygen and bronchodilators - Continue Solu-Medrol and antibiotics. - Improved and stable. Pulmonary consultation appreciated.  MRSA RUL lung abscess/pneumonia - As per chest x-ray, decreased cavitatory RUL abscess - Per thoracic surgery: Nothing for them to offer at this time. - Pulmonary consultation appreciated: No role for percutaneous drainage which risks bronchopleural fistula/empyema. Continue IV vancomycin while hospitalized then oral Bactrim until outpatient follow-up with pulmonology/Dr. Delton Coombes - Blood Cultures 2: Negative to date  Type II DM  - Continue low-dose Lantus and SSI . Fluctuating and mildly uncontrolled.  GERD  - Continue PPI   Hypokalemia - Replace and follow as needed  Chronic pain - Currently controlled  DVT prophylaxis: Subcutaneous heparin Code Status: Full Family Communication: None at bedside Disposition Plan: DC to SNF when medically stable   Consultants:  2-D echo 05/24/15: Study Conclusions  - Left ventricle: The cavity size was mildly dilated. Systolic function was mildly reduced. The estimated ejection fraction was in the range of 45% to 50%. There is hypokinesis of the inferior myocardium. Doppler parameters are consistent with abnormal left ventricular relaxation (grade 1 diastolic dysfunction). - Aortic valve: Trileaflet; mildly thickened, moderately calcified leaflets. Transvalvular velocity was minimally increased. There was no stenosis. There was mild regurgitation. Valve area (Vmax): 2.18 cm^2.  Impressions:  - Compared to the prior study, there has been no significant interval change.   Procedures:  None  Antibiotics:  IV vancomycin 05/23/15 >  IV Zosyn 05/23/15 > 05/25/15   Subjective: Dyspnea with mild  exertion. No  wheezing or rhonchi. Denies cough. Occasional left-sided chest pain.  Objective: Filed Vitals:   05/25/15 0743 05/25/15 0810 05/25/15 0834 05/25/15 1135  BP:  173/79 138/75   Pulse:      Temp:  98.5 F (36.9 C)    TempSrc:  Oral    Resp:      Height:      Weight:      SpO2: 97%   98%   respiratory rate 15 per minute and temperature 98.2F.  Intake/Output Summary (Last 24 hours) at 05/25/15 1237 Last data filed at 05/25/15 8119  Gross per 24 hour  Intake   1978 ml  Output   2125 ml  Net   -147 ml   Filed Weights   05/23/15 2048 05/24/15 0431 05/25/15 0400  Weight: 82.555 kg (182 lb) 84.233 kg (185 lb 11.2 oz) 84.1 kg (185 lb 6.5 oz)     Exam:  General exam: Moderately built and nourished middle-aged male sitting comfortably propped up in bed  Respiratory system: Improved breath sounds. Distant breath sounds with no wheezing or rhonchi. No increased work of breathing. No reproducible chest wall tenderness  Cardiovascular system: S1 & S2 heard, RRR. No JVD, murmurs, gallops, clicks. No pedal edema. Telemetry: Sinus rhythm Gastrointestinal system: Abdomen is obese/nondistended, soft and nontender. Normal bowel sounds heard. Central nervous system: Alert and oriented. No focal neurological deficits. Extremities: Symmetric 5 x 5 power.   Data Reviewed: Basic Metabolic Panel:  Recent Labs Lab 05/23/15 1722 05/24/15 0415 05/25/15 0645  NA 129* 139 137  K 4.5 3.1* 3.9  CL 88* 102 95*  CO2 GLUCOSE 136* 152* 285*  BUN CREATININE 0.60* 0.37* 0.63  CALCIUM 8.2* 7.0* 8.3*   Liver Function Tests:  Recent Labs Lab 05/23/15 1722  AST 24  ALT 21  ALKPHOS 101  BILITOT 0.5  PROT 5.2*  ALBUMIN 2.4*   No results for input(s): LIPASE, AMYLASE in the last 168 hours. No results for input(s): AMMONIA in the last 168 hours. CBC:  Recent Labs Lab 05/23/15 1722 05/24/15 0415 05/24/15 1610 05/25/15 0645  WBC 13.5* 6.9  --  6.3  NEUTROABS  12.9*  --   --  5.4  HGB 8.7* 6.9* 9.1* 8.3*  HCT 26.7* 21.4* 28.6* 25.8*  MCV 86.7 87.3  --  86.6  PLT 124* 178  --  214   Cardiac Enzymes:  Recent Labs Lab 05/24/15 0415 05/24/15 1255 05/24/15 1800 05/25/15 0100 05/25/15 0645  TROPONINI 0.08* 0.06* 0.04* 0.04* 0.03   BNP (last 3 results)  Recent Labs  10/27/14 2311  PROBNP 89.0   CBG:  Recent Labs Lab 05/24/15 1129 05/24/15 1616 05/24/15 2134 05/25/15 0735 05/25/15 1127  GLUCAP 137* 165* 177* 221* 154*    Recent Results (from the past 240 hour(s))  Culture, sputum-assessment     Status: None   Collection Time: 05/17/15  5:10 AM  Result Value Ref Range Status   Specimen Description SPUTUM  Final   Special Requests NONE  Final   Sputum evaluation   Final    THIS SPECIMEN IS ACCEPTABLE. RESPIRATORY CULTURE REPORT TO FOLLOW.   Report Status 05/17/2015 FINAL  Final  Culture, respiratory (NON-Expectorated)     Status: None   Collection Time: 05/17/15  5:10 AM  Result Value Ref Range Status   Specimen Description SPUTUM  Final   Special Requests NONE  Final   Gram Stain   Final    ABUNDANT WBC  PRESENT,BOTH PMN AND MONONUCLEAR RARE SQUAMOUS EPITHELIAL CELLS PRESENT FEW GRAM POSITIVE COCCI IN PAIRS IN CLUSTERS Gram Stain Report Called to,Read Back By and Verified With: Gram Stain Report Called to,Read Back By and Verified With: RICKY.O AT 0657 ON [id number] BY WEBSP Performed at [facility]    Culture   Final    MODERATE METHICILLIN RESISTANT STAPHYLOCOCCUS AUREUS Note: RIFAMPIN AND GENTAMICIN SHOULD NOT BE USED AS SINGLE DRUGS FOR TREATMENT OF STAPH INFECTIONS. Performed at [facility]    Report Status 05/19/2015 FINAL  Final   Organism ID, Bacteria METHICILLIN RESISTANT STAPHYLOCOCCUS AUREUS  Final      Susceptibility   Methicillin resistant staphylococcus aureus - MIC*    CLINDAMYCIN >=8 RESISTANT Resistant     ERYTHROMYCIN >=8 RESISTANT Resistant     GENTAMICIN <=0.5 SENSITIVE Sensitive      LEVOFLOXACIN >=8 RESISTANT Resistant     OXACILLIN >=4 RESISTANT Resistant     PENICILLIN >=0.5 RESISTANT Resistant     RIFAMPIN <=0.5 SENSITIVE Sensitive     TRIMETH/SULFA <=10 SENSITIVE Sensitive     VANCOMYCIN <=0.5 SENSITIVE Sensitive     TETRACYCLINE <=1 SENSITIVE Sensitive     * MODERATE METHICILLIN RESISTANT STAPHYLOCOCCUS AUREUS  Blood Culture (routine x 2)     Status: None (Preliminary result)   Collection Time: 05/23/15  5:57 PM  Result Value Ref Range Status   Specimen Description BLOOD RIGHT ARM  Final   Special Requests BOTTLES DRAWN AEROBIC AND ANAEROBIC 5CC  Final   Culture   Final           BLOOD CULTURE RECEIVED NO GROWTH TO DATE CULTURE WILL BE HELD FOR 5 DAYS BEFORE ISSUING A FINAL NEGATIVE REPORT Performed at [facility]    Report Status PENDING  Incomplete  Blood Culture (routine x 2)     Status: None (Preliminary result)   Collection Time: 05/23/15  6:10 PM  Result Value Ref Range Status   Specimen Description BLOOD RIGHT ARM  Final   Special Requests BOTTLES DRAWN AEROBIC AND ANAEROBIC 5CC  Final   Culture   Final           BLOOD CULTURE RECEIVED NO GROWTH TO DATE CULTURE WILL BE HELD FOR 5 DAYS BEFORE ISSUING A FINAL NEGATIVE REPORT Performed at [facility]    Report Status PENDING  Incomplete  Urine culture     Status: None   Collection Time: 05/23/15  6:55 PM  Result Value Ref Range Status   Specimen Description URINE, RANDOM  Final   Special Requests NONE  Final   Colony Count NO GROWTH Performed at [facility]   Final   Culture NO GROWTH Performed at [facility]   Final   Report Status 05/24/2015 FINAL  Final         Studies: Dg Chest Port 1 View  05/23/2015   CLINICAL DATA:  Acute chest pain today with chest congestion, shortness of breath, and wheezing. Left arm numbness.  EXAM: PORTABLE CHEST - 1 VIEW  COMPARISON:  05/16/2015, 04/03/2015 and chest CT dated 05/15/2015  FINDINGS: PICC tip just above  the cavoatrial junction, unchanged. There are new small Kerley B-lines at the left base and there is new slight atelectasis at the right base. Heart size and pulmonary vascularity are normal. Right lung abscess is smaller than on the prior study. No acute osseous abnormality.  IMPRESSION: 1. Decreased cavitary right upper lobe  lung abscess. 2. Bibasilar atelectasis with possible interstitial  edema at the left lung base.   Electronically Signed   By: Francene BoyersJames  Maxwell M.D.   On: 05/23/2015 17:51        Scheduled Meds: . sodium chloride   Intravenous Once  . sodium chloride   Intravenous Once  . [START ON 05/26/2015] aspirin  81 mg Oral Pre-Cath  . aspirin  325 mg Oral Daily  . bisoprolol  5 mg Oral BID  . budesonide (PULMICORT) nebulizer solution  0.25 mg Nebulization QID  . clopidogrel  75 mg Oral Daily  . fentaNYL  12.5 mcg Transdermal Q72H  . fluticasone  2 spray Each Nare BID  . heparin  5,000 Units Subcutaneous 3 times per day  . insulin aspart  0-9 Units Subcutaneous TID WC  . insulin glargine  7 Units Subcutaneous Daily  . ipratropium-albuterol  3 mL Nebulization QID  . nystatin  5 mL Oral QID  . pantoprazole  40 mg Oral BID AC  . predniSONE  20 mg Oral Q breakfast  . sodium chloride  3 mL Intravenous Q12H  . sodium chloride  3 mL Intravenous Q12H  . vancomycin (VANCOCIN) 1250 mg IVPB  1,250 mg Intravenous Q12H   Continuous Infusions: . [START ON 05/26/2015] sodium chloride     Followed by  . [START ON 05/26/2015] sodium chloride    . nitroGLYCERIN 10 mcg/min (05/23/15 2018)    Principal Problem:   Chest pain Active Problems:   Essential hypertension   Coronary atherosclerosis   Acute on chronic respiratory failure with hypoxia   COPD (chronic obstructive pulmonary disease)   Respiratory distress   OSA (obstructive sleep apnea)   History of CHF (congestive heart failure)   DM (diabetes mellitus) type II uncontrolled, periph vascular disorder   Esophageal reflux   Lung  abscess RUL   Sepsis   Chronic respiratory failure with hypercapnia    Time spent: 25 minutes    Saajan Willmon, MD, FACP, FHM. [facility] Pager [phone]  If 7PM-7AM, please contact night-coverage [email or web address] Password [email or web address] 05/25/2015, 12:37 PM    LOS: 2 days

## 2015-05-25 NOTE — Evaluation (Signed)
Physical Therapy Evaluation Patient Details Name: [name] MRN: [id number] DOB: 1951-10-19 Today's Date: 05/25/2015   History of Present Illness  47 yowm with severe copd /steroid /02 dep/ no pfts in EPIC/ s/p smoking cessation "about 6 m" pta with clear cxr in early May 2016 then evolving RUL cavitary dz late May with MRSA in sputum with rec for IV Vanc then bactrim so d/c to SNF 5/30 readmitted to [facility] 6/3 with L cp non pleuritic with suspected IHD and f/u for R sided abscess requested by [facility] am 6/5    Clinical Impression  Pt presents with severe limitations to functional mobility related to chronic health problems, prolonged bed rest and complicated by back pain and anxiety.  Of note, pt's SaO2 remained at 98-100% on 3L O2 throughout session noting periods where sensor reading was compromised by hand position but once hand repositioned SaO2 returned to normal.  Anxiety noted to impact work of breath, provided calming support.  Pt prefers to d/c home but admits sister cannot provide physical assist.  Has had negative experiences in previous SNF, would need to pursue different facility?  Pt neither approved or refused SNF placement following this hospitalization but agrees he would benefit from ongoing PT to address deficits and return to highest functional level.    Will initiate PT in acute setting and recommend CSW to assist with placement for rehab purposes.  See below for details.       Follow Up Recommendations SNF (pt prefers home vs. new facility search)    Equipment Recommendations       Recommendations for Other Services       Precautions / Restrictions Precautions Precautions: Fall Precaution Comments: go slowly to manage anxiety and pain; assist for all OOB mobility, use gait belt and device Restrictions Other Position/Activity Restrictions: prefers to elevate legs for edema control      Mobility  Bed Mobility Overal bed mobility: Needs Assistance Bed  Mobility: Rolling;Sit to Sidelying;Sidelying to Sit Rolling: Min assist Sidelying to sit: Min assist     Sit to sidelying: Mod assist General bed mobility comments: cues to go slowly and with control; assist to lift/lower trunk and raise legs to EOB; difficulty with bed flat  Transfers Overall transfer level: Needs assistance Equipment used: 1 person hand held assist Transfers: Sit to/from Stand Sit to Stand: Mod assist;From elevated surface         General transfer comment: pt reluctant and anxious re: Ox levels; able to stand and side step x2 with assist to shift weight and to control back to sitting.    Ambulation/Gait Ambulation/Gait assistance:  (pt declined ambulation)              Stairs            Wheelchair Mobility    Modified Rankin (Stroke Patients Only)       Balance Overall balance assessment: Needs assistance Sitting-balance support: No upper extremity supported;Feet supported;Feet unsupported Sitting balance-Leahy Scale: Poor Sitting balance - Comments: prefers to prop bil hands; sits trunk flexed; instructed and led in repeated unsupported sitting with tall posture x 10-15 boutsm, 10 reps, then repeated sitting up from supported post lean to strengthen trunk muscles   Standing balance support: During functional activity Standing balance-Leahy Scale: Zero                               Pertinent Vitals/Pain Pain Assessment: 0-10 Pain Score:  4  Pain Location: back Pain Intervention(s): Limited activity within patient's tolerance;Monitored during session;Repositioned;Relaxation (pt calls for morphine; heating pad in bed)    Home Living Family/patient expects to be discharged to:: Private residence Living Arrangements: Other relatives (sister, who CANNOT physically assist) Available Help at Discharge: Available 24 hours/day Type of Home: House Home Access: Level entry     Home Layout: One level Home Equipment: Cane - single  point;Walker - 2 wheels      Prior Function Level of Independence: Needs assistance   Gait / Transfers Assistance Needed: unable since last admission           Hand Dominance   Dominant Hand: Right    Extremity/Trunk Assessment   Upper Extremity Assessment: Generalized weakness (full ROM though forward flexion and good grip strength)           Lower Extremity Assessment: Generalized weakness RLE Deficits / Details: supine and seated, lacks few degrees extension in knees related to hamstring tension; quad atrophy pronounced; edema bilateral feet, provided manual therapy to assist with fluid return and foot flexibility;  (instructed to AP/QS frequently thru day/dangle EOB often too) LLE Deficits / Details: same as right     Communication   Communication: No difficulties (verbose)  Cognition Arousal/Alertness: Awake/alert Behavior During Therapy: Anxious Overall Cognitive Status: No family/caregiver present to determine baseline cognitive functioning                      General Comments General comments (skin integrity, edema, etc.): pedal edema; quadraceps atrophy; reports hx T9, L2, L5 (??accurate) vertebral fx    Exercises        Assessment/Plan    PT Assessment Patient needs continued PT services  PT Diagnosis Generalized weakness;Difficulty walking   PT Problem List Pain;Obesity;Cardiopulmonary status limiting activity;Decreased safety awareness;Decreased knowledge of use of DME;Decreased mobility;Decreased activity tolerance;Decreased range of motion;Decreased strength  PT Treatment Interventions Patient/family education;Manual techniques;Therapeutic exercise;Therapeutic activities;Functional mobility training;Gait training;DME instruction   PT Goals (Current goals can be found in the Care Plan section) Acute Rehab PT Goals Patient Stated Goal: go home PT Goal Formulation: With patient Time For Goal Achievement: 06/08/15 Potential to Achieve Goals:  Fair    Frequency Min 3X/week   Barriers to discharge Decreased caregiver support (sister can provide only supervision)      Co-evaluation               End of Session Equipment Utilized During Treatment: Gait belt;Oxygen Activity Tolerance: Patient limited by pain;Patient limited by fatigue (anxiety) Patient left: in chair;with call bell/phone within reach;with nursing/sitter in room (RT arrived to provide breathing treatment) Nurse Communication: Mobility status;Precautions         Time: [id number] PT Time Calculation (min) (ACUTE ONLY): 50 min   Charges:   PT Evaluation $Initial PT Evaluation Tier I: 1 Procedure PT Treatments $Therapeutic Exercise: 8-22 mins $Therapeutic Activity: 8-22 mins   PT G Codes:        Dennis BastMartin, Jakayden Cancio Galloway 05/25/2015, 11:58 AM

## 2015-05-25 NOTE — Progress Notes (Signed)
Subjective:  Still with some left-sided chest pain.  He had [facility] but records are not available through care everywhere from February.  We will request those.  EKG shows new T-wave changes anteriorly.  The echocardiogram yesterday did not show any significant pericardial effusion but ejection fraction was lower than it had previously been.  Objective:  Vital Signs in the last 24 hours: BP 138/75 mmHg  Pulse 86  Temp(Src) 98.5 F (36.9 C) (Oral)  Resp 15  Ht 5\' 11"  (1.803 m)  Wt 84.1 kg (185 lb 6.5 oz)  BMI 25.87 kg/m2  SpO2 97%  Physical Exam:  Elderly male in no acute distress, moderately obese  Petechiae noted over her chest, discoloration of her forearms. Lungs: Scattered rhonchi  Cardiac:  Regular rhythm, normal S1 and S2, no S3 Abdomen:  Soft, nontender, no masses Extremities:  No edema present  Intake/Output from previous day: 06/04 0701 - 06/05 0700 In: 1778 [P.O.:480; I.V.:260; Blood:338; IV Piggyback:700] Out: 2050 [Urine:2050]  Weight Filed Weights   05/23/15 2048 05/24/15 0431 05/25/15 0400  Weight: 82.555 kg (182 lb) 84.233 kg (185 lb 11.2 oz) 84.1 kg (185 lb 6.5 oz)    Lab Results: Basic Metabolic Panel:  Recent Labs  96/03/5405/04/16 0415 05/25/15 0645  NA 139 137  K 3.1* 3.9  CL 102 95*  CO2 29 31  GLUCOSE 152* 285*  BUN 7 8  CREATININE 0.37* 0.63   CBC:  Recent Labs  05/23/15 1722 05/24/15 0415 05/24/15 1610 05/25/15 0645  WBC 13.5* 6.9  --  6.3  NEUTROABS 12.9*  --   --   --   HGB 8.7* 6.9* 9.1* 8.3*  HCT 26.7* 21.4* 28.6* 25.8*  MCV 86.7 87.3  --  86.6  PLT 124* 178  --  214   Cardiac Enzymes: Troponin (Point of Care Test)  Recent Labs  05/23/15 1728  TROPIPOC 0.11*   Cardiac Panel (last 3 results)  Recent Labs  05/24/15 1800 05/25/15 0100 05/25/15 0645  TROPONINI 0.04* 0.04* 0.03    Telemetry: Sinus rhythm  Assessment/Plan:  1.  Chest pain with some typical other atypical features but new  T-wave changes anteriorly with evidently stenting done of unknown vessels [facility]  2.  Anemia with transfusion yesterday appears stable at the present time no definite GI bleeding but we'll need to watch  3.  Pulmonary abscess 4.  Severe COPD  Recommendations:  At this point in time with continued chest pain will need repeat catheterization.  Could not see cath report through Epic so we'll [facility].  No anticoagulation at the present time.      Darden PalmerW. Spencer Tanequa Kretz, Jr.  MD [facility]  05/25/2015, 9:52 AM

## 2015-05-25 NOTE — Progress Notes (Signed)
Pt. Refused cpap. RT informed pt. To notify if he changes his mind. 

## 2015-05-25 NOTE — Consult Note (Signed)
PULMONARY / CRITICAL CARE MEDICINE   Name: [name] MRN: [id number] DOB: [birth year]    ADMISSION DATE:  05/23/2015 CONSULTATION DATE:  05/25/15   REFERRING MD :  [place]/ [facility]  CHIEF COMPLAINT:  L chest pain   INITIAL PRESENTATION:  89 yowm with severe copd /steroid /02 dep/ no pfts in EPIC/  s/p smoking cessation "about 6 m" pta with clear cxr in early May 2016 then evolving RUL cavitary dz late May with MRSA in sputum with rec for IV Vanc then bactrim so d/c to SNF 5/30 readmitted to [facility] 6/3 with L cp non pleuritic with suspected IHD and f/u for R sided abscess requested by [facility] am 6/5   STUDIES:     SIGNIFICANT EVENTS:     HISTORY OF PRESENT ILLNESS:  64 y.o. male with PMH of hypertension, hyperlipidemia (allergic to Lipitor), diabetes mellitus, GERD, COPD on 2-3 L of oxygen at home, history of stroke, chronic back pain, diastolic congestive heart failure, history of osteomyelitis on right hip, coronary artery disease (post status of stent placement, 2+ 2 +1), recently treated HCAP, currently being treated for right upper lobe abscess, who presents with chest pain and shortness of breath.  Patient was recently hospitalized from 5/15 and 5/30, and treated for HCAP and right upper lobe abscess. He had CT of chest,which showed right upper lobe abscess. Pulmonary team consulted CT surgeon. Per CT surgery, he was not a candidate for surgery due to severe COPD. At the most, a drain could be placed if the abscess was not resolving. Sputum culture grew MRSA, sensitive to vancomycin and Bactrim. He was discharged on IV vancomycin for 1 week, then Bactrim for 10 days. He has been in SNF for rehab and has been compliant to the antibodies and other medications.  He reports that since early morning at about 6:30, he started having chest pain. His chest pain is located in left side of his chest, sharp, 10 out of 10 in severity, radiating to the left arm it is not aggravated or  alleviated with any known factors. Has been intermittent, but has never come back to 0 level. His chest pain is not pleuritic. It is not aggravated by deep breath. Patient also reports having mild cough, and worsening shortness of breath. No fever or chills. No tenderness over the calf areas.  Currently patient denies fever, chills, running nose, ear pain, headaches, abdominal pain, diarrhea, constipation, dysuria, urgency, frequency, hematuria, skin rashes. No unilateral weakness, numbness or tingling sensations. No vision change or hearing loss.  In ED, patient was found to have WBC 13.5, normal temperature, lactate 2.93, troponin 0.11, EKG has T flattening in V2-V5 and poor R-wave progression, chest x-ray showed a decreased cavity of right upper lobe abscess and interstitial edema. Patient is admitted    Pt denies much change doe x 200 ft on best days slow pace on 02, min brown am sputum  No obvious day to day or daytime variabilty or assoc   chest tightness, subjective wheeze overt sinus or hb symptoms. No unusual exp hx or h/o childhood pna/ asthma or knowledge of premature birth.  Sleeping ok without nocturnal  or early am exacerbation  of respiratory  c/o's or need for noct saba. Also denies any obvious fluctuation of symptoms with weather or environmental changes or other aggravating or alleviating factors except as outlined above   Current Medications, Allergies, Complete Past Medical History, Past Surgical History, Family History, and Social History were reviewed in [facility] electronic medical record.  ROS  The following are not active complaints unless bolded sore throat, dysphagia, dental problems, itching, sneezing,  nasal congestion or excess/ purulent secretions, ear ache,   fever, chills, sweats, unintended wt loss, clasically pleuritic or exertional cp, hemoptysis,  orthopnea pnd or leg swelling, presyncope, palpitations, heartburn, abdominal pain, anorexia, nausea, vomiting,  diarrhea  or change in bowel or urinary habits, change in stools or urine, dysuria,hematuria,  rash, arthralgias, visual complaints, headache, numbness weakness or ataxia or problems with walking or coordination,  change in mood/affect or memory.       PAST MEDICAL HISTORY :   has a past medical history of COPD (chronic obstructive pulmonary disease); CAD (coronary artery disease); Hypertension; Hyperlipidemia; Myocardial infarction; Asthma; GERD (gastroesophageal reflux disease); Stroke; Osteomyelitis hip ("when I was young"); CHF (congestive heart failure); Pneumonia; OSA on CPAP; Type II diabetes mellitus; History of blood transfusion ("couple times"); Chronic back pain; and Anxiety.  has past surgical history that includes Appendectomy (1963); Cardiac catheterization (? 2009; ?2012); and Coronary angioplasty with stent (2006; ?2008; ?2010; 01/2015). Prior to Admission medications   Medication Sig Start Date End Date Taking? Authorizing Provider  acetaminophen (TYLENOL) 325 MG tablet Take 2 tablets (650 mg total) by mouth every 6 (six) hours as needed for mild pain (or Fever >/= 101). 05/19/15  Yes Belkys A Regalado, MD  albuterol (PROVENTIL HFA;VENTOLIN HFA) 108 (90 BASE) MCG/ACT inhaler Inhale 2 puffs into the lungs every 4 (four) hours as needed for wheezing or shortness of breath. 05/19/15  Yes Belkys A Regalado, MD  albuterol (PROVENTIL) (2.5 MG/3ML) 0.083% nebulizer solution Take 3 mLs (2.5 mg total) by nebulization every 6 (six) hours as needed for wheezing or shortness of breath. Dx 496 11/04/14  Yes Shanker Levora Dredge, MD  aspirin 325 MG tablet Take 1 tablet (325 mg total) by mouth every morning. 11/04/14  Yes Shanker Levora Dredge, MD  clopidogrel (PLAVIX) 75 MG tablet Take 1 tablet (75 mg total) by mouth daily. 11/04/14  Yes Shanker Levora Dredge, MD  fentaNYL (DURAGESIC - DOSED MCG/HR) 12 MCG/HR Place 1 patch (12.5 mcg total) onto the skin every 3 (three) days. 05/19/15  Yes Belkys A Regalado, MD   fluticasone (FLONASE) 50 MCG/ACT nasal spray Place 2 sprays into both nostrils 2 (two) times daily. 11/04/14  Yes Shanker Levora Dredge, MD  furosemide (LASIX) 40 MG tablet Take 1 tablet (40 mg total) by mouth daily. 05/19/15  Yes Belkys A Regalado, MD  insulin glargine (LANTUS) 100 UNIT/ML injection Inject 0.1 mLs (10 Units total) into the skin daily. 05/19/15  Yes Belkys A Regalado, MD  ipratropium (ATROVENT) 0.02 % nebulizer solution Take 2.5 mLs (0.5 mg total) by nebulization every 4 (four) hours as needed for wheezing or shortness of breath. 12/27/14  Yes Leslye Peer, MD  metFORMIN (GLUCOPHAGE) 500 MG tablet Take 1 tablet (500 mg total) by mouth 2 (two) times daily with a meal. 11/04/14  Yes Shanker Levora Dredge, MD  metoprolol (LOPRESSOR) 50 MG tablet Take 1 tablet (50 mg total) by mouth 2 (two) times daily. 11/04/14  Yes Shanker Levora Dredge, MD  mometasone-formoterol (DULERA) 100-5 MCG/ACT AERO Inhale 2 puffs into the lungs 2 (two) times daily. 11/20/14  Yes Quentin Angst, MD  nitroGLYCERIN (NITROGLYN) 2 % ointment Apply 0.5 inches topically every 6 (six) hours. 05/19/15  Yes Belkys A Regalado, MD  nystatin (MYCOSTATIN) 100000 UNIT/ML suspension Take 5 mLs (500,000 Units total) by mouth 4 (four) times daily. 01/09/14  Yes Belkys A Regalado, MD  pantoprazole (PROTONIX) 40 MG tablet Take 1 tablet (40 mg total) by mouth 2 (two) times daily. Patient taking differently: Take 40 mg by mouth 2 (two) times daily. Patient states it's the 40mg  tablet 05/19/15  Yes Belkys A Regalado, MD  potassium chloride SA (K-DUR,KLOR-CON) 20 MEQ tablet Take 1 tablet (20 mEq total) by mouth daily. 11/04/14  Yes Shanker Levora Dredge, MD  predniSONE (DELTASONE) 10 MG tablet Take 5 tablets daily for 3 days then 3 tablet daily Patient taking differently: Take 10 mg by mouth See admin instructions. Take 5 tablets daily for 3 days then 3 tablet daily 05/19/15  Yes Belkys A Regalado, MD  traMADol (ULTRAM) 50 MG tablet Take 2 tablets  (100 mg total) by mouth every 6 (six) hours as needed for moderate pain. 05/19/15  Yes Belkys A Regalado, MD  vancomycin 1,250 mg in sodium chloride 0.9 % 250 mL Inject 1,250 mg into the vein every 12 (twelve) hours. 05/19/15  Yes Belkys A Regalado, MD  benzonatate (TESSALON) 200 MG capsule Take 1 capsule (200 mg total) by mouth 3 (three) times daily. Patient not taking: Reported on 05/23/2015 05/19/15   Belkys A Regalado, MD  guaiFENesin (ROBITUSSIN) 100 MG/5ML SOLN Take 10 mLs (200 mg total) by mouth every 6 (six) hours. Patient taking differently: Take 10 mLs by mouth every 6 (six) hours as needed for cough or to loosen phlegm.  05/19/15   Alba Cory, MD   Allergies  Allergen Reactions  . Lipitor [Atorvastatin Calcium] Other (See Comments)    Muscle aches    FAMILY HISTORY:  has no family status information on file.  SOCIAL HISTORY:  reports that he quit smoking about 7 months ago. His smoking use included Cigarettes. He has a 12.75 pack-year smoking history. He has never used smokeless tobacco. He reports that he does not drink alcohol or use illicit drugs.     SUBJECTIVE:  nad at 30 degrees on 3lpm  VITAL SIGNS: Temp:  [97.6 F (36.4 C)-98.7 F (37.1 C)] 98.5 F (36.9 C) (06/05 0810) Pulse Rate:  [78-95] 86 (06/05 0400) Resp:  [13-21] 15 (06/05 0400) BP: (133-174)/(75-99) 138/75 mmHg (06/05 0834) SpO2:  [95 %-100 %] 97 % (06/05 0743) Weight:  [185 lb 6.5 oz (84.1 kg)] 185 lb 6.5 oz (84.1 kg) (06/05 0400)     Intake/Output Summary (Last 24 hours) at 05/25/15 1033 Last data filed at 05/25/15 1610  Gross per 24 hour  Intake   1978 ml  Output   2275 ml  Net   -297 ml    PHYSICAL EXAMINATION: General:  disheveled elderly wm > stated age/gruff voice  HEENT mild turbinate edema. Edentulous/ oropharynx no thrush or excess pnd or cobblestoning.  No JVD or cervical adenopathy. Mild accessory muscle hypertrophy. Trachea midline, nl thryroid. Chest was hyperinflated by  percussion with diminished breath sounds and moderate increased exp time without wheeze. No amphroic breath sounds. Hoover sign positive at mid inspiration. Regular rate and rhythm without murmur gallop or rub or increase P2 or edema.  Abd: no hsm, nl excursion. Ext warm without cyanosis or clubbing.    LABS:  CBC  Recent Labs Lab 05/23/15 1722 05/24/15 0415 05/24/15 1610 05/25/15 0645  WBC 13.5* 6.9  --  6.3  HGB 8.7* 6.9* 9.1* 8.3*  HCT 26.7* 21.4* 28.6* 25.8*  PLT 124* 178  --  214   Coag's  Recent Labs Lab 05/23/15 2210  APTT 25  INR 0.94  BMET  Recent Labs Lab 05/23/15 1722 05/24/15 0415 05/25/15 0645  NA 129* 139 137  K 4.5 3.1* 3.9  CL 88* 102 95*  CO2 30 29 31   BUN 10 7 8   CREATININE 0.60* 0.37* 0.63  GLUCOSE 136* 152* 285*   Electrolytes  Recent Labs Lab 05/23/15 1722 05/24/15 0415 05/25/15 0645  CALCIUM 8.2* 7.0* 8.3*   Sepsis Markers  Recent Labs Lab 05/23/15 1731 05/23/15 2210 05/24/15 0120  LATICACIDVEN 2.93* 3.0* 1.7  PROCALCITON  --  0.33  --    ABG  Recent Labs Lab 05/23/15 2135  PHART 7.409  PCO2ART 52.2*  PO2ART 113*   Liver Enzymes  Recent Labs Lab 05/23/15 1722  AST 24  ALT 21  ALKPHOS 101  BILITOT 0.5  ALBUMIN 2.4*   Cardiac Enzymes  Recent Labs Lab 05/24/15 1800 05/25/15 0100 05/25/15 0645  TROPONINI 0.04* 0.04* 0.03   Glucose  Recent Labs Lab 05/23/15 2215 05/24/15 0731 05/24/15 1129 05/24/15 1616 05/24/15 2134 05/25/15 0735  GLUCAP 222* 175* 137* 165* 177* 221*    Imaging No results found.   ASSESSMENT / PLAN:  1) severe copd/ 02 dep/ steroid dependent - The goal with a chronic steroid dependent illness is always arriving at the lowest effective dose that controls the disease/symptoms and not accepting a set "formula" which is based on statistics or guidelines that don't always take into account patient  variability or the natural hx of the dz in every individual patient, which may  well vary over time.  For now therefore I recommend the patient start to reduce dose of steroids to lowest achievable dose    2) Probable MRSA pna/ abscess and cyst/bulla formation  - no role at all from my perspective for percutaneous drainage which risks  bp fistula/ empypema. - IV vanc ok while in hospital then bactrim then ov with Pulmonary/ [place] prior to stopping the bactri- - d/c zosyn   3) chronic hypercarbic hypoxemic resp failure - Well compensated > titrate 02 to sat > 90%    4) HBP/ possible angina - .Strongly prefer in this setting: bisoprolol  the most beta -1  selective Beta blocker available generic choice  on the market.      Discussed with Dr Bennie PieriniHongagli > agree with present rx    Sandrea HughsMichael Connelly [place], MD Pulmonary and Critical Care Medicine [facility] Cell [phone] After 5:30 PM or weekends, call [phone]

## 2015-05-26 ENCOUNTER — Encounter (HOSPITAL_COMMUNITY)
Admission: EM | Disposition: A | Discharge status: Home Health | Payer: Medicare Other | Source: Home / Self Care | Attending: Internal Medicine

## 2015-05-26 ENCOUNTER — Encounter (HOSPITAL_COMMUNITY): Payer: Self-pay | Admitting: Cardiovascular Disease

## 2015-05-26 DIAGNOSIS — J432 Centrilobular emphysema: Secondary | ICD-10-CM

## 2015-05-26 DIAGNOSIS — I10 Essential (primary) hypertension: Secondary | ICD-10-CM

## 2015-05-26 DIAGNOSIS — D62 Acute posthemorrhagic anemia: Secondary | ICD-10-CM

## 2015-05-26 DIAGNOSIS — J9612 Chronic respiratory failure with hypercapnia: Secondary | ICD-10-CM

## 2015-05-26 DIAGNOSIS — J852 Abscess of lung without pneumonia: Secondary | ICD-10-CM

## 2015-05-26 DIAGNOSIS — I2510 Atherosclerotic heart disease of native coronary artery without angina pectoris: Secondary | ICD-10-CM

## 2015-05-26 DIAGNOSIS — I214 Non-ST elevation (NSTEMI) myocardial infarction: Secondary | ICD-10-CM | POA: Insufficient documentation

## 2015-05-26 HISTORY — PX: CARDIAC CATHETERIZATION: SHX172

## 2015-05-26 LAB — BASIC METABOLIC PANEL
Anion gap: 7 (ref 5–15)
BUN: 8 mg/dL (ref 6–20)
CALCIUM: 8.4 mg/dL — AB (ref 8.9–10.3)
CO2: 35 mmol/L — ABNORMAL HIGH (ref 22–32)
Chloride: 95 mmol/L — ABNORMAL LOW (ref 101–111)
Creatinine, Ser: 0.48 mg/dL — ABNORMAL LOW (ref 0.61–1.24)
GFR calc Af Amer: >60 mL/min (ref >60)
GFR calc non Af Amer: >60 mL/min (ref >60)
GLUCOSE: 172 mg/dL — AB (ref 65–99)
Potassium: 3.5 mmol/L (ref 3.5–5.1)
SODIUM: 137 mmol/L (ref 135–145)

## 2015-05-26 LAB — CBC
HEMATOCRIT: 27.3 % — AB (ref 39.0–52.0)
HEMOGLOBIN: 8.7 g/dL — AB (ref 13.0–17.0)
MCH: 28.1 pg (ref 26.0–34.0)
MCHC: 31.9 g/dL (ref 30.0–36.0)
MCV: 88.1 fL (ref 78.0–100.0)
Platelets: 195 10*3/uL (ref 150–400)
RBC: 3.1 MIL/uL — ABNORMAL LOW (ref 4.22–5.81)
RDW: 16.4 % — AB (ref 11.5–15.5)
WBC: 6.3 10*3/uL (ref 4.0–10.5)

## 2015-05-26 LAB — LEGIONELLA ANTIGEN, URINE

## 2015-05-26 LAB — GLUCOSE, CAPILLARY
GLUCOSE-CAPILLARY: 131 mg/dL — AB (ref 65–99)
Glucose-Capillary: 153 mg/dL — ABNORMAL HIGH (ref 65–99)
Glucose-Capillary: 215 mg/dL — ABNORMAL HIGH (ref 65–99)
Glucose-Capillary: 149 mg/dL — ABNORMAL HIGH (ref 65–99)

## 2015-05-26 LAB — PROTIME-INR
INR: 0.91 (ref 0.00–1.49)
Prothrombin Time: 12.5 seconds (ref 11.6–15.2)

## 2015-05-26 SURGERY — LEFT HEART CATH AND CORONARY ANGIOGRAPHY
Anesthesia: LOCAL

## 2015-05-26 MED ORDER — FENTANYL CITRATE (PF) 100 MCG/2ML IJ SOLN
INTRAMUSCULAR | Status: DC | PRN
  Administered 2015-05-26: 50 ug via INTRAVENOUS

## 2015-05-26 MED ORDER — SODIUM CHLORIDE 0.9 % IJ SOLN: 3.0000 mL | INTRAMUSCULAR | Status: DC | PRN

## 2015-05-26 MED ORDER — HEPARIN (PORCINE) IN NACL 2-0.9 UNIT/ML-% IJ SOLN
INTRAMUSCULAR | Status: AC
  Filled 2015-05-26: qty 1000

## 2015-05-26 MED ORDER — HEPARIN SODIUM (PORCINE) 1000 UNIT/ML IJ SOLN
INTRAMUSCULAR | Status: DC | PRN
  Administered 2015-05-26: 4000 [IU] via INTRAVENOUS

## 2015-05-26 MED ORDER — HEPARIN SODIUM (PORCINE) 5000 UNIT/ML IJ SOLN
5000.0000 [IU] | Freq: Three times a day (TID) | INTRAMUSCULAR | Status: DC
  Administered 2015-05-27 – 2015-06-03 (×22): 5000 [IU] via SUBCUTANEOUS
  Filled 2015-05-26 (×22): qty 1

## 2015-05-26 MED ORDER — IOHEXOL 350 MG/ML SOLN
INTRAVENOUS | Status: DC | PRN
  Administered 2015-05-26: 90 mL via INTRA_ARTERIAL

## 2015-05-26 MED ORDER — SODIUM CHLORIDE 0.9 % IV SOLN
250.0000 mL | INTRAVENOUS | Status: DC | PRN
  Administered 2015-05-28: 250 mL via INTRAVENOUS
  Administered 2015-05-30: 10 mL via INTRAVENOUS

## 2015-05-26 MED ORDER — HEPARIN SODIUM (PORCINE) 1000 UNIT/ML IJ SOLN
INTRAMUSCULAR | Status: AC
  Filled 2015-05-26: qty 1

## 2015-05-26 MED ORDER — LIDOCAINE HCL (PF) 1 % IJ SOLN
INTRAMUSCULAR | Status: AC
  Filled 2015-05-26: qty 30

## 2015-05-26 MED ORDER — CLOPIDOGREL BISULFATE 75 MG PO TABS
75.0000 mg | ORAL_TABLET | Freq: Every day | ORAL | Status: DC
  Administered 2015-05-27 – 2015-06-03 (×8): 75 mg via ORAL
  Filled 2015-05-26 (×8): qty 1

## 2015-05-26 MED ORDER — VERAPAMIL HCL 2.5 MG/ML IV SOLN
INTRAVENOUS | Status: DC | PRN
  Administered 2015-05-26: 14:00:00 via INTRA_ARTERIAL

## 2015-05-26 MED ORDER — SODIUM CHLORIDE 0.9 % IJ SOLN
3.0000 mL | Freq: Two times a day (BID) | INTRAMUSCULAR | Status: DC
  Administered 2015-05-27 – 2015-06-02 (×8): 3 mL via INTRAVENOUS

## 2015-05-26 MED ORDER — ONDANSETRON HCL 4 MG/2ML IJ SOLN: 4.0000 mg | Freq: Four times a day (QID) | INTRAMUSCULAR | Status: DC | PRN

## 2015-05-26 MED ORDER — SODIUM CHLORIDE 0.9 % IV SOLN
INTRAVENOUS | Status: AC
Start: 2015-05-26 — End: 2015-05-26
  Administered 2015-05-26: 1000 mL via INTRAVENOUS

## 2015-05-26 MED ORDER — NITROGLYCERIN 1 MG/10 ML FOR IR/CATH LAB
INTRA_ARTERIAL | Status: AC
  Filled 2015-05-26: qty 10

## 2015-05-26 MED ORDER — FENTANYL CITRATE (PF) 100 MCG/2ML IJ SOLN
INTRAMUSCULAR | Status: AC
  Filled 2015-05-26: qty 2

## 2015-05-26 MED ORDER — MIDAZOLAM HCL 2 MG/2ML IJ SOLN
INTRAMUSCULAR | Status: AC
  Filled 2015-05-26: qty 2

## 2015-05-26 MED ORDER — ASPIRIN EC 81 MG PO TBEC
81.0000 mg | DELAYED_RELEASE_TABLET | Freq: Every day | ORAL | Status: DC
  Administered 2015-05-27 – 2015-06-03 (×8): 81 mg via ORAL
  Filled 2015-05-26 (×8): qty 1

## 2015-05-26 MED ORDER — ACETAMINOPHEN 325 MG PO TABS
650.0000 mg | ORAL_TABLET | ORAL | Status: DC | PRN
  Administered 2015-05-27 – 2015-06-01 (×8): 650 mg via ORAL
  Filled 2015-05-26 (×8): qty 2

## 2015-05-26 MED ORDER — MIDAZOLAM HCL 2 MG/2ML IJ SOLN
INTRAMUSCULAR | Status: DC | PRN
  Administered 2015-05-26: 2 mg via INTRAVENOUS

## 2015-05-26 SURGICAL SUPPLY — 13 items
CATH INFINITI 5FR ANG PIGTAIL (CATHETERS) ×3 IMPLANT
CATH INFINITI 5FR MULTPACK ANG (CATHETERS) IMPLANT
CATH OPTITORQUE TIG 4.0 5F (CATHETERS) ×3 IMPLANT
DEVICE RAD COMP TR BAND LRG (VASCULAR PRODUCTS) ×3 IMPLANT
GLIDESHEATH SLEND A-KIT 6F 22G (SHEATH) ×3 IMPLANT
KIT HEART LEFT (KITS) ×3 IMPLANT
PACK CARDIAC CATHETERIZATION (CUSTOM PROCEDURE TRAY) ×3 IMPLANT
SHEATH PINNACLE 5F 10CM (SHEATH) IMPLANT
SYR MEDRAD MARK V 150ML (SYRINGE) ×3 IMPLANT
TRANSDUCER W/STOPCOCK (MISCELLANEOUS) ×3 IMPLANT
TUBING CIL FLEX 10 FLL-RA (TUBING) ×3 IMPLANT
WIRE EMERALD 3MM-J .035X150CM (WIRE) IMPLANT
WIRE SAFE-T 1.5MM-J .035X260CM (WIRE) ×3 IMPLANT

## 2015-05-26 NOTE — Progress Notes (Signed)
Patient Name: [name] Date of Encounter: 05/26/2015  Primary Cardiologist: Seen By Dr. Antoine Poche in the Past    Principal Problem:   Chest pain Active Problems:   Essential hypertension   Coronary atherosclerosis   Acute on chronic respiratory failure with hypoxia   COPD (chronic obstructive pulmonary disease)   Respiratory distress   OSA (obstructive sleep apnea)   History of CHF (congestive heart failure)   DM (diabetes mellitus) type II uncontrolled, periph vascular disorder   Esophageal reflux   Lung abscess RUL   Sepsis   Chronic respiratory failure with hypercapnia    SUBJECTIVE  Soft intermittent CP last night, also has muscle soreness as well. IV nitro helped with symptom. Denies significant SOB.  CURRENT MEDS . sodium chloride   Intravenous Once  . sodium chloride   Intravenous Once  . aspirin  325 mg Oral Daily  . bisoprolol  5 mg Oral BID  . budesonide (PULMICORT) nebulizer solution  0.25 mg Nebulization QID  . clopidogrel  75 mg Oral Daily  . fentaNYL  12.5 mcg Transdermal Q72H  . fluticasone  2 spray Each Nare BID  . heparin  5,000 Units Subcutaneous 3 times per day  . insulin aspart  0-9 Units Subcutaneous TID WC  . insulin glargine  10 Units Subcutaneous Daily  . ipratropium-albuterol  3 mL Nebulization QID  . nystatin  5 mL Oral QID  . pantoprazole  40 mg Oral BID AC  . predniSONE  20 mg Oral Q breakfast  . sodium chloride  3 mL Intravenous Q12H  . sodium chloride  3 mL Intravenous Q12H  . vancomycin (VANCOCIN) 1250 mg IVPB  1,250 mg Intravenous Q12H    OBJECTIVE  Filed Vitals:   05/26/15 0400 05/26/15 0455 05/26/15 0559 05/26/15 0751  BP:  148/77  147/83  Pulse:  70  71  Temp: 97.9 F (36.6 C)     TempSrc: Oral     Resp:  13  14  Height:      Weight:      SpO2:  100% 100% 100%    Intake/Output Summary (Last 24 hours) at 05/26/15 0835 Last data filed at 05/26/15 0500  Gross per 24 hour  Intake    912 ml  Output   1200 ml  Net    -288 ml   Filed Weights   05/23/15 2048 05/24/15 0431 05/25/15 0400  Weight: 182 lb (82.555 kg) 185 lb 11.2 oz (84.233 kg) 185 lb 6.5 oz (84.1 kg)    PHYSICAL EXAM  General: Pleasant, NAD. Neuro: Alert and oriented X 3. Moves all extremities spontaneously. Psych: Normal affect. HEENT:  Normal  Neck: Supple without bruits or JVD. Lungs:  Resp regular and unlabored. Markedly decreased breath sound with some mild wheezing Heart: RRR no s3, s4, or murmurs. Abdomen: Soft, non-tender, non-distended, BS + x 4.  Extremities: No clubbing, cyanosis or edema. DP/PT/Radials 2+ and equal bilaterally.  Accessory Clinical Findings  CBC  Recent Labs  05/23/15 1722  05/25/15 0645 05/26/15 0450  WBC 13.5*  < > 6.3 6.3  NEUTROABS 12.9*  --  5.4  --   HGB 8.7*  < > 8.3* 8.7*  HCT 26.7*  < > 25.8* 27.3*  MCV 86.7  < > 86.6 88.1  PLT 124*  < > 214 195  < > = values in this interval not displayed. Basic Metabolic Panel  Recent Labs  05/25/15 0645 05/26/15 0450  NA 137 137  K 3.9 3.5  CL 95* 95*  CO2 31 35*  GLUCOSE 285* 172*  BUN 8 8  CREATININE 0.63 0.48*  CALCIUM 8.3* 8.4*   Liver Function Tests  Recent Labs  05/23/15 1722  AST 24  ALT 21  ALKPHOS 101  BILITOT 0.5  PROT 5.2*  ALBUMIN 2.4*   Cardiac Enzymes  Recent Labs  05/24/15 1800 05/25/15 0100 05/25/15 0645  TROPONINI 0.04* 0.04* 0.03    TELE NSR with HR 80s, occasional PVCs    ECG  No new EKG  Echocardiogram 05/26/2015  LV EF: 45% -  50%  ------------------------------------------------------------------- Indications:   Chest pain 786.51. Pericardial effusion 423.9.  ------------------------------------------------------------------- History:  PMH:  Coronary artery disease. Congestive heart failure. Congestive heart failure. Chronic obstructive pulmonary disease. PMH:  Myocardial infarction. Risk factors: Hypertension. Diabetes  mellitus.  ------------------------------------------------------------------- Study Conclusions  - Left ventricle: The cavity size was mildly dilated. Systolic function was mildly reduced. The estimated ejection fraction was in the range of 45% to 50%. There is hypokinesis of the inferior myocardium. Doppler parameters are consistent with abnormal left ventricular relaxation (grade 1 diastolic dysfunction). - Aortic valve: Trileaflet; mildly thickened, moderately calcified leaflets. Transvalvular velocity was minimally increased. There was no stenosis. There was mild regurgitation. Valve area (Vmax): 2.18 cm^2.  Impressions:  - Compared to the prior study, there has been no significant interval change.    Radiology/Studies  Dg Chest 2 View  05/15/2015   CLINICAL DATA:  Pneumonia and right upper lobe abscess.  EXAM: CHEST - 2 VIEW  COMPARISON:  05/14/2015  FINDINGS: The anterior right upper lobe abscess containing an air-fluid level likely has enlarged with maximum diameter approaching 10 cm in the lateral projection. There is more fluid in the abscess with air-fluid level visualized dependently. This abscess developed quite quickly and was not present on 05/04/2015. Followup CT of the chest would be helpful for further characterization, as there may be a component of bronchopleural fistula or overt communication with a bronchus. No pneumothorax is identified.  No pulmonary edema or significant pleural fluid identified. The heart size and mediastinal contours remain normal.  IMPRESSION: Enlarging right upper lobe pulmonary abscess. Followup CT of the chest would be helpful for further characterization, as there may be an overt communication with a bronchus or component of bronchopleural fistula.   Electronically Signed   By: Irish Lack M.D.   On: 05/15/2015 08:09   Dg Chest 2 View  05/14/2015   CLINICAL DATA:  Chest pain and cough.  EXAM: CHEST  2 VIEW  COMPARISON:   05/12/2015.  05/04/2015.  CT 03/20/2015 .  FINDINGS: Left PICC line stable position. Mediastinum hilar structures are normal. Cavitary lesion containing a small amount of fluid is noted in the right upper lobe. This could represent fluid within a bullae secondary to infection. Developing abscess cannot be excluded. No pleural effusion or pneumothorax. Stable cardiomegaly with normal pulmonary vascularity. Diffuse multilevel degenerative change with multiple compression fractures again noted.  IMPRESSION: 1.  Left PICC line stable position.  2. Right upper lobe bullae containing fluid, possibly secondary to infection. Developing pulmonary abscess cannot be excluded .   Electronically Signed   By: Maisie Fus  Register   On: 05/14/2015 08:24   Dg Thoracic Spine 2 View  05/15/2015   CLINICAL DATA:  Diffuse back pain following a recent fall. History of chronic mid back pain.  EXAM: THORACIC SPINE - 2 VIEW  COMPARISON:  Chest CT obtained earlier today.  FINDINGS: Previously noted multiple chronic thoracic and  upper lumbar vertebral compression deformities. No acute fractures or subluxations seen. One of the lower thoracic vertebral compression deformities has a linear vacuum phenomenon beneath the superior endplate, unchanged from the CT earlier. Left PICC tip in the inferior aspect of the superior vena cava. Thoracic and upper lumbar spine degenerative changes. The previously noted right upper lobe cavitary lesion is again demonstrated.  IMPRESSION: 1. Stable multiple thoracic and upper lumbar vertebral compression deformities compared to the CT earlier today. These were also previously compared to previous radiographs and shown to be stable. 2. Left PICC tip in the inferior aspect of the superior vena cava. If a position at the cavoatrial junction is desired, it would be recommended that this be advanced 2 cm. 3. Stable right upper lobe cavitary lesion.   Electronically Signed   By: Beckie Salts M.D.   On: 05/15/2015  21:41   Ct Chest W Contrast  05/15/2015   CLINICAL DATA:  Abnormal chest radiograph, right upper lobe pneumonia  EXAM: CT CHEST WITH CONTRAST  TECHNIQUE: Multidetector CT imaging of the chest was performed during intravenous contrast administration.  CONTRAST:  75mL OMNIPAQUE IOHEXOL 300 MG/ML  SOLN  COMPARISON:  Chest radiograph dated 05/15/2015. CTA chest dated 03/20/2015.  FINDINGS: Mediastinum/Nodes: Heart is normal in size. No pericardial effusion.  Coronary atherosclerosis. Postsurgical changes related to prior CABG.  Small mediastinal lymph nodes, including a 9 mm short axis right paratracheal node (series 2/ image 26), likely reactive.  Visualized thyroid is unremarkable.  Lungs/Pleura: 6.7 x 8.5 x 7.7 cm mildly thick-walled cavitary lesion in the anterior/medial right upper lobe (series 3/image 23 with associated air-fluid level and surrounding ground-glass opacity/inflammatory changes. Lesion was not present on prior CT. These findings are most suggestive of pulmonary abscess.  Underlying mild emphysematous changes.  No pleural effusion or pneumothorax.  Upper abdomen: Visualized upper abdomen is notable for vascular calcifications.  Musculoskeletal: Degenerative changes of the visualized thoracolumbar spine.  Multiple mild to moderate thoracic compression fracture deformities involving the mid to lower thoracic spine, chronic.  IMPRESSION: 8.5 cm cavitary lesion with air-fluid level and surrounding inflammatory changes in the medial right upper lobe, new from prior CT, compatible with pulmonary abscess.  9 mm short axis right paratracheal node, likely reactive.  Consider follow-up CT chest in 4-6 weeks to document improvement/resolution.   Electronically Signed   By: Charline Bills M.D.   On: 05/15/2015 13:56   Dg Chest Port 1 View  05/23/2015   CLINICAL DATA:  Acute chest pain today with chest congestion, shortness of breath, and wheezing. Left arm numbness.  EXAM: PORTABLE CHEST - 1 VIEW   COMPARISON:  05/16/2015, 04/03/2015 and chest CT dated 05/15/2015  FINDINGS: PICC tip just above the cavoatrial junction, unchanged. There are new small Kerley B-lines at the left base and there is new slight atelectasis at the right base. Heart size and pulmonary vascularity are normal. Right lung abscess is smaller than on the prior study. No acute osseous abnormality.  IMPRESSION: 1. Decreased cavitary right upper lobe  lung abscess. 2. Bibasilar atelectasis with possible interstitial edema at the left lung base.   Electronically Signed   By: Francene Boyers M.D.   On: 05/23/2015 17:51   Dg Chest Port 1 View  05/16/2015   CLINICAL DATA:  Shortness of breath, respiratory failure  EXAM: PORTABLE CHEST - 1 VIEW  COMPARISON:  05/15/2015  FINDINGS: Medial right upper lobe large cavitary mass again evident consistent with a pulmonary abscess when compared to yesterday's  chest CT. This measures approximate 8.8 x 8.3 cm by plain radiography. Left lung remains clear. No developing effusion. Negative for pneumothorax. Trachea midline. Normal heart size and vascularity. Left PICC line tip mid SVC level.  IMPRESSION: Medial right upper lobe large cavitary mass compatible with a pulmonary abscess. No new finding by plain radiography.   Electronically Signed   By: Judie PetitM.  Shick M.D.   On: 05/16/2015 07:56   Dg Chest Port 1 View  05/12/2015   CLINICAL DATA:  Dyspnea  EXAM: PORTABLE CHEST - 1 VIEW  COMPARISON:  05/04/2015  FINDINGS: New ill-defined airspace disease in the right upper lung.  Background of hyperinflation and emphysematous change. No edema, effusion, or pneumothorax.  Normal heart size and mediastinal contours.  IMPRESSION: 1. Right upper lobe pneumonia, new from 05/04/2015. 2. Emphysema.   Electronically Signed   By: Marnee SpringJonathon  Watts M.D.   On: 05/12/2015 03:14   Dg Chest Portable 1 View  05/05/2015   CLINICAL DATA:  Chest pain and shortness of breath.  EXAM: PORTABLE CHEST - 1 VIEW  COMPARISON:  04/21/2015   FINDINGS: Lungs remain hyperinflated. The cardiomediastinal contours are unchanged, heart at the upper limits of normal. Bibasilar atelectasis or scarring is again seen. Pulmonary vasculature is normal. No consolidation, pleural effusion, or pneumothorax. No acute osseous abnormalities are seen.  IMPRESSION: Unchanged hyperinflation without acute pulmonary process.   Electronically Signed   By: Rubye OaksMelanie  Ehinger M.D.   On: 05/05/2015 00:45    ASSESSMENT AND PLAN  64 yo male with CAD, chronic diastolic HF, end stage COPD on chronic O2, HTN, HLD and recent admission for ARF in the setting of HCAP and RUL abscess readmitted for CP and dyspnea. Also noted to have NSTEMI in the setting of severe anemia. CXR shows chronic lung abcess, CT surgery consulted, pt not a surgical candidate.   1. NSTEMI, in the setting of severe anemia and known underlying residual coronary stenosis  - hgb 6.9 improved to 8.7, serial trop trending down  - repeat echo 05/24/2015 EF 45-50%, hypokinesis of inferior myocardium, grade 1 diastolic dysfunction, mild AR, no pericardial effusion. Previously echo 2 weeks ago 5/18 EF 60-65%, no RWMA, grade 1 diastolic dysfunction, small pericardial effusion.   - not ideal candidate given recent anemia, however with EF down compare to previous echo 2 wks ago and new anterolateral TWI, pending LHC today, currently on DAPT despite recent severe anemia given PCI in Feb with DES  - Risk and benefit of procedure explained to the patient who display clear understanding and agree to proceed. Discussed with patient possible procedural risk include bleeding, vascular injury, renal injury, arrythmia, MI, stroke and loss of limb or life.   2. CAD  - PCI to RCA in 2005  - LHC 2008 PCI to occluded mid LCx, 30-40% ost LCx, 70% ost RI (treated with medical therapy), 40% prox RCA and 30% ost PDA stenosis, previous RCA stent patent.   - LHC at [facility] [place] with DES to RCA and LCx on 02/07/2015, EF 40-45%  - Echo  05/07/15 normal LVSF with EF of 60-65%, G1DD and no WMA  3. Chronic RUL abcess (admitted from 05/04/15 to 05/19/15 for resp failure and same lung abcess discharged on IV abx)  - seen for the same during last admission, evaluated by Pulmonology and CT surgery who both determined he was not a candidate for surgery due to his severe COPD  - per CT surgery, may consider percu drain if does not resolve on  abx  4. End stage COPD on home O2 5. Chronic diastolic HF 6. HTN 7. HLD 8. DM 9. H/o CVA 10. H/o LE DVT: previously on eliquis, stopped after GI bleed in early 2016 11. Anemia: reportedly had endoscopy and colonoscopy without significant finding.  Ramond Dial PA-C Pager: [phone]   I have examined the patient and reviewed assessment and plan and discussed with patient.  Agree with above as stated.   He is had multiple PCI's performed at [facility] in the past. He was seen in [facility] He was told that he had left lower extremity DVT. He did have a GI bleed but he states this was in the setting of being on aspirin, Plavix, Eliquis and Lovenox injections. The Lovenox and Eliquis were stopped. He states that he had endoscopy and that he has been relatively stable ever since just on aspirin and Plavix. He also mentioned that there is right lower extremity DVT present but this appeared old so his doctor in [facility] did not feel this required treatment with anticoagulation.  He has recent stents. Hopefully, they will be patent. He is high risk for long-term anticoagulation but is committed to this already due to his recent stents. Would consider using a Synergy stent if something is required, just in the event that his antiplatelets therapy has to be stopped early.  His chest pain is somewhat atypical. He describes it as most severe when he turns a certain way. He states that it feels different than the lung issues that he has had. He has had successful right radial cath in the past. Given his  anemia, would plan on right radial approach.  Plato Alspaugh S.

## 2015-05-26 NOTE — Progress Notes (Signed)
PROGRESS NOTE    [name] KGM:[id number] DOB: 05-10-1951 DOA: 05/23/2015 PCP: Jeanann Lewandowsky, MD  HPI/Brief narrative 64 year old male with history of CAD, COPD on home oxygen 3 L/m, chronic diastolic CHF (EF 64-40 percent by echo 5/18), HTN, DM 2, chronic pain, anemia, NSVT, morbid obesity tobacco abuse, recent hospitalization 05/04/15-05/19/15 related to acute on chronic respiratory failure from COPD exacerbation, aspiration pneumonia and RUL abscess. TCTS consulted-not surgical candidate due to severe COPD and recommended drain placement. Eventually pulmonology recommended discharge on IV vancomycin 1 week followed by Bactrim PO 10 days and outpatient follow-up with pulmonology. Patient readmitted on 05/23/15 for left-sided chest pain.   Assessment/Plan:  NSTEMI - Mildly elevated troponin but with downward trend, 0.11 > 0.08 > 0.06 >then decreased gradually to normal - New EKG changes/anterolateral T-wave inversions on 6/4 compared to the day prior. - Repeat 2-D echo 6/4: LVEF 45-50 percent, hypokinesis of the inferior myocardium and grade 1 diastolic dysfunction and no pericardial effusion. EF has worsened since echo 5/18 - Likely precipitated by severe anemia in the context of underlying CAD - Transfuse to keep hemoglobin >8 g per DL. - Continue aspirin, Plavix and beta blockers. Intolerant to statins. No IV heparin secondary to severe anemia. Continue IV NTG - Cardiology follow-up appreciated: Discussed with cardiology. Plan for cardiac cath 6/6. - Requesting medical records/[facility]ath [facility] - No anginal type of chest pain. - Patient for cardiac cath 05/26/15.  Acute on chronic anemia - Hemoglobin lately has been in the 8 g per DL range. - Hb dropped from 8.7 > 6.9 in 12 hours in the absence of overt bleeding or melena.? Accurate - s/p 1 unit of PRBC 6/4 - Improved and stable with hemoglobin >8 last 2 days.  Mild acute on chronic combined  diastolic/new systolic CHF - Lasix posttransfusion and monitor - Improved  Severe COPD (oxygen and steroid dependent) with exacerbation/chronic respiratory failure on home oxygen 3 L/m - Continue oxygen and bronchodilators - Continue Solu-Medrol and antibiotics. - Improved and stable. Pulmonary consultation appreciated.  MRSA RUL lung abscess/pneumonia - As per chest x-ray, decreased cavitatory RUL abscess - Per thoracic surgery: Nothing for them to offer at this time. - Pulmonary consultation appreciated: No role for percutaneous drainage which risks bronchopleural fistula/empyema. Continue IV vancomycin while hospitalized then oral Bactrim until outpatient follow-up with pulmonology/Dr. Delton Coombes - Blood Cultures 2: Negative to date  Type II DM  - Continue low-dose Lantus and SSI . Fluctuating and mildly uncontrolled.  GERD  - Continue PPI   Hypokalemia - Replace and follow as needed  Chronic pain - Currently controlled  DVT prophylaxis: Subcutaneous heparin Code Status: Full Family Communication: None at bedside Disposition Plan: DC to SNF when medically stable   Consultants:  2-D echo 05/24/15: Study Conclusions  - Left ventricle: The cavity size was mildly dilated. Systolic function was mildly reduced. The estimated ejection fraction was in the range of 45% to 50%. There is hypokinesis of the inferior myocardium. Doppler parameters are consistent with abnormal left ventricular relaxation (grade 1 diastolic dysfunction). - Aortic valve: Trileaflet; mildly thickened, moderately calcified leaflets. Transvalvular velocity was minimally increased. There was no stenosis. There was mild regurgitation. Valve area (Vmax): 2.18 cm^2.  Impressions:  - Compared to the prior study, there has been no significant interval change.   Procedures:  None  Antibiotics:  IV vancomycin 05/23/15 >  IV Zosyn 05/23/15 > 05/25/15   Subjective: Patient seen prior to  cardiac cath  this morning. Denied ischemic type of chest pain but stated left side of chest felt sore. Chronic back pain. Chronic dyspnea unchanged. No cough. Had BM 6/5.  Objective: Filed Vitals:   05/26/15 1549 05/26/15 1604 05/26/15 1635 05/26/15 1658  BP: 167/92 159/95 147/81 147/81  Pulse: 81 85 85 88  Temp:      TempSrc:      Resp: 16 17 15 18   Height:      Weight:      SpO2: 100% 100% 100% 100%    Intake/Output Summary (Last 24 hours) at 05/26/15 1700 Last data filed at 05/26/15 1216  Gross per 24 hour  Intake    912 ml  Output   1325 ml  Net   -413 ml   Filed Weights   05/23/15 2048 05/24/15 0431 05/25/15 0400  Weight: 82.555 kg (182 lb) 84.233 kg (185 lb 11.2 oz) 84.1 kg (185 lb 6.5 oz)     Exam:  General exam: Moderately built and nourished middle-aged male sitting comfortably propped up in bed  Respiratory system: Improved breath sounds. Distant breath sounds with no wheezing or rhonchi. No increased work of breathing. No reproducible chest wall tenderness  Cardiovascular system: S1 & S2 heard, RRR. No JVD, murmurs, gallops, clicks. No pedal edema. Telemetry: Sinus rhythm Gastrointestinal system: Abdomen is obese/nondistended, soft and nontender. Normal bowel sounds heard. Central nervous system: Alert and oriented. No focal neurological deficits. Extremities: Symmetric 5 x 5 power.   Data Reviewed: Basic Metabolic Panel:  Recent Labs Lab 05/23/15 1722 05/24/15 0415 05/25/15 0645 05/26/15 0450  NA 129* 139 137 137  K 4.5 3.1* 3.9 3.5  CL 88* 102 95* 95*  CO2 30 29 31  35*  GLUCOSE 136* 152* 285* 172*  BUN 10 7 8 8   CREATININE 0.60* 0.37* 0.63 0.48*  CALCIUM 8.2* 7.0* 8.3* 8.4*   Liver Function Tests:  Recent Labs Lab 05/23/15 1722  AST 24  ALT 21  ALKPHOS 101  BILITOT 0.5  PROT 5.2*  ALBUMIN 2.4*   No results for input(s): LIPASE, AMYLASE in the last 168 hours. No results for input(s): AMMONIA in the last 168 hours. CBC:  Recent  Labs Lab 05/23/15 1722 05/24/15 0415 05/24/15 1610 05/25/15 0645 05/26/15 0450  WBC 13.5* 6.9  --  6.3 6.3  NEUTROABS 12.9*  --   --  5.4  --   HGB 8.7* 6.9* 9.1* 8.3* 8.7*  HCT 26.7* 21.4* 28.6* 25.8* 27.3*  MCV 86.7 87.3  --  86.6 88.1  PLT 124* 178  --  214 195   Cardiac Enzymes:  Recent Labs Lab 05/24/15 0415 05/24/15 1255 05/24/15 1800 05/25/15 0100 05/25/15 0645  TROPONINI 0.08* 0.06* 0.04* 0.04* 0.03   BNP (last 3 results)  Recent Labs  10/27/14 2311  PROBNP 89.0   CBG:  Recent Labs Lab 05/25/15 1617 05/25/15 2123 05/26/15 0749 05/26/15 1159 05/26/15 1636  GLUCAP 198* 213* 131* 153* 215*    Recent Results (from the past 240 hour(s))  Culture, sputum-assessment     Status: None   Collection Time: 05/17/15  5:10 AM  Result Value Ref Range Status   Specimen Description SPUTUM  Final   Special Requests NONE  Final   Sputum evaluation   Final    THIS SPECIMEN IS ACCEPTABLE. RESPIRATORY CULTURE REPORT TO FOLLOW.   Report Status 05/17/2015 FINAL  Final  Culture, respiratory (NON-Expectorated)     Status: None   Collection Time: 05/17/15  5:10 AM  Result Value Ref  Range Status   Specimen Description SPUTUM  Final   Special Requests NONE  Final   Gram Stain   Final    ABUNDANT WBC PRESENT,BOTH PMN AND MONONUCLEAR RARE SQUAMOUS EPITHELIAL CELLS PRESENT FEW GRAM POSITIVE COCCI IN PAIRS IN CLUSTERS Gram Stain Report Called to,Read Back By and Verified With: Gram Stain Report Called to,Read Back By and Verified With: [email or web address].O AT 0657 ON [id number] BY WEBSP Performed at [facility]    Culture   Final    MODERATE METHICILLIN RESISTANT STAPHYLOCOCCUS AUREUS Note: RIFAMPIN AND GENTAMICIN SHOULD NOT BE USED AS SINGLE DRUGS FOR TREATMENT OF STAPH INFECTIONS. Performed at [facility]    Report Status 05/19/2015 FINAL  Final   Organism ID, Bacteria METHICILLIN RESISTANT STAPHYLOCOCCUS AUREUS  Final      Susceptibility   Methicillin resistant  staphylococcus aureus - MIC*    CLINDAMYCIN >=8 RESISTANT Resistant     ERYTHROMYCIN >=8 RESISTANT Resistant     GENTAMICIN <=0.5 SENSITIVE Sensitive     LEVOFLOXACIN >=8 RESISTANT Resistant     OXACILLIN >=4 RESISTANT Resistant     PENICILLIN >=0.5 RESISTANT Resistant     RIFAMPIN <=0.5 SENSITIVE Sensitive     TRIMETH/SULFA <=10 SENSITIVE Sensitive     VANCOMYCIN <=0.5 SENSITIVE Sensitive     TETRACYCLINE <=1 SENSITIVE Sensitive     * MODERATE METHICILLIN RESISTANT STAPHYLOCOCCUS AUREUS  Blood Culture (routine x 2)     Status: None (Preliminary result)   Collection Time: 05/23/15  5:57 PM  Result Value Ref Range Status   Specimen Description BLOOD RIGHT ARM  Final   Special Requests BOTTLES DRAWN AEROBIC AND ANAEROBIC 5CC  Final   Culture   Final           BLOOD CULTURE RECEIVED NO GROWTH TO DATE CULTURE WILL BE HELD FOR 5 DAYS BEFORE ISSUING A FINAL NEGATIVE REPORT Performed at [facility]    Report Status PENDING  Incomplete  Blood Culture (routine x 2)     Status: None (Preliminary result)   Collection Time: 05/23/15  6:10 PM  Result Value Ref Range Status   Specimen Description BLOOD RIGHT ARM  Final   Special Requests BOTTLES DRAWN AEROBIC AND ANAEROBIC 5CC  Final   Culture   Final           BLOOD CULTURE RECEIVED NO GROWTH TO DATE CULTURE WILL BE HELD FOR 5 DAYS BEFORE ISSUING A FINAL NEGATIVE REPORT Performed at [facility]    Report Status PENDING  Incomplete  Urine culture     Status: None   Collection Time: 05/23/15  6:55 PM  Result Value Ref Range Status   Specimen Description URINE, RANDOM  Final   Special Requests NONE  Final   Colony Count NO GROWTH Performed at [facility]   Final   Culture NO GROWTH Performed at [facility]   Final   Report Status 05/24/2015 FINAL  Final         Studies: No results found.      Scheduled Meds: . sodium chloride   Intravenous Once  . sodium chloride   Intravenous Once  .  [START ON 05/27/2015] aspirin EC  81 mg Oral Daily  . bisoprolol  5 mg Oral BID  . budesonide (PULMICORT) nebulizer solution  0.25 mg Nebulization QID  . [START ON 05/27/2015] clopidogrel  75 mg Oral Q breakfast  . fentaNYL  12.5 mcg Transdermal Q72H  . fluticasone  2 spray Each Nare  BID  . [START ON 05/27/2015] heparin  5,000 Units Subcutaneous 3 times per day  . insulin aspart  0-9 Units Subcutaneous TID WC  . insulin glargine  10 Units Subcutaneous Daily  . ipratropium-albuterol  3 mL Nebulization QID  . nystatin  5 mL Oral QID  . pantoprazole  40 mg Oral BID AC  . predniSONE  20 mg Oral Q breakfast  . sodium chloride  3 mL Intravenous Q12H  . sodium chloride  3 mL Intravenous Q12H  . vancomycin (VANCOCIN) 1250 mg IVPB  1,250 mg Intravenous Q12H   Continuous Infusions: . sodium chloride 1,000 mL (05/26/15 1513)  . nitroGLYCERIN 10 mcg/min (05/23/15 2018)    Principal Problem:   Chest pain Active Problems:   Essential hypertension   Coronary atherosclerosis   Acute on chronic respiratory failure with hypoxia   COPD (chronic obstructive pulmonary disease)   Respiratory distress   OSA (obstructive sleep apnea)   History of CHF (congestive heart failure)   DM (diabetes mellitus) type II uncontrolled, periph vascular disorder   Esophageal reflux   Lung abscess RUL   Sepsis   Chronic respiratory failure with hypercapnia    Time spent: 25 minutes    Ashle Stief, MD, FACP, FHM. [facility] Pager [phone]  If 7PM-7AM, please contact night-coverage [email or web address] Password [email or web address] 05/26/2015, 5:00 PM    LOS: 3 days

## 2015-05-26 NOTE — Clinical Social Work Note (Signed)
Clinical Social Work Assessment  Patient Details  Name: [name] MRN: [id number] Date of Birth: 1951/02/16  Date of referral:  05/26/15               Reason for consult:  Discharge Planning, Facility Placement                Permission sought to share information with:  Chartered certified accountant granted to share information::  Yes, Verbal Permission Granted  Name::        Agency::  [facility]  Relationship::     Contact Information:     Housing/Transportation Living arrangements for the past 2 months:  [place] of Information:  Patient Patient Interpreter Needed:  None Criminal Activity/Legal Involvement Pertinent to Current Situation/Hospitalization:  No - Comment as needed Significant Relationships:  Siblings (Sister: [name]) Lives with:  Facility Resident Do you feel safe going back to the place where you live?  No (Patient requesting new SNF placement versus home with home health.) Need for family participation in patient care:  No (Coment) (Patient not requesting family involvement at this time.)  Care giving concerns:  Patient expressed concern regarding the care provided to patient at previous SNF. Patient requesting new SNF placement versus home with patient's sister [name]) and home health services.   Social Worker assessment / plan:  CSW received referral for SNF placement at time of discharge. CSW met with patient at bedside to discuss discharge disposition. Patient informed CSW patient admitted for [facility], but would prefer to find new SNF placement at time of discharge. Patient hopeful to improve to be able to discharge home with home health to patient's sister home. Per patient, patient's sister will be able to provide care for patient at home. CSW to continue to follow and assist with discharge planning needs.  Employment status:  Retired Forensic scientist:  Medicare PT Recommendations:  [place] / Referral to community resources:  [place]  Patient/Family's Response to care:  Patient understanding and agreeable to CSW plan of care.  Patient/Family's Understanding of and Emotional Response to Diagnosis, Current Treatment, and Prognosis:  Patient understanding and agreeable to CSW plan of care.  Emotional Assessment Appearance:  Appears stated age Attitude/Demeanor/Rapport:  Other (Pleasant) Affect (typically observed):  Pleasant, Appropriate, Frustrated Orientation:  Oriented to Self, Oriented to Place, Oriented to  Time, Oriented to Situation Alcohol / Substance use:  Not Applicable Psych involvement (Current and /or in the community):  No (Comment) (Not appropriate on admission.)  Discharge Needs  Concerns to be addressed:  No discharge needs identified Readmission within the last 30 days:  Yes Current discharge risk:  None Barriers to Discharge:  No Barriers Identified   Caroline Sauger, LCSW 05/26/2015, 11:51 AM [phone]

## 2015-05-26 NOTE — H&P (View-Only) (Signed)
 Patient Name: Jacquees G Harper Date of Encounter: 05/26/2015  Primary Cardiologist: Seen By Dr. Hochrein in the Past    Principal Problem:   Chest pain Active Problems:   Essential hypertension   Coronary atherosclerosis   Acute on chronic respiratory failure with hypoxia   COPD (chronic obstructive pulmonary disease)   Respiratory distress   OSA (obstructive sleep apnea)   History of CHF (congestive heart failure)   DM (diabetes mellitus) type II uncontrolled, periph vascular disorder   Esophageal reflux   Lung abscess RUL   Sepsis   Chronic respiratory failure with hypercapnia    SUBJECTIVE  Soft intermittent CP last night, also has muscle soreness as well. IV nitro helped with symptom. Denies significant SOB.  CURRENT MEDS . sodium chloride   Intravenous Once  . sodium chloride   Intravenous Once  . aspirin  325 mg Oral Daily  . bisoprolol  5 mg Oral BID  . budesonide (PULMICORT) nebulizer solution  0.25 mg Nebulization QID  . clopidogrel  75 mg Oral Daily  . fentaNYL  12.5 mcg Transdermal Q72H  . fluticasone  2 spray Each Nare BID  . heparin  5,000 Units Subcutaneous 3 times per day  . insulin aspart  0-9 Units Subcutaneous TID WC  . insulin glargine  10 Units Subcutaneous Daily  . ipratropium-albuterol  3 mL Nebulization QID  . nystatin  5 mL Oral QID  . pantoprazole  40 mg Oral BID AC  . predniSONE  20 mg Oral Q breakfast  . sodium chloride  3 mL Intravenous Q12H  . sodium chloride  3 mL Intravenous Q12H  . vancomycin (VANCOCIN) 1250 mg IVPB  1,250 mg Intravenous Q12H    OBJECTIVE  Filed Vitals:   05/26/15 0400 05/26/15 0455 05/26/15 0559 05/26/15 0751  BP:  148/77  147/83  Pulse:  70  71  Temp: 97.9 F (36.6 C)     TempSrc: Oral     Resp:  13  14  Height:      Weight:      SpO2:  100% 100% 100%    Intake/Output Summary (Last 24 hours) at 05/26/15 0835 Last data filed at 05/26/15 0500  Gross per 24 hour  Intake    912 ml  Output   1200 ml  Net    -288 ml   Filed Weights   05/23/15 2048 05/24/15 0431 05/25/15 0400  Weight: 182 lb (82.555 kg) 185 lb 11.2 oz (84.233 kg) 185 lb 6.5 oz (84.1 kg)    PHYSICAL EXAM  General: Pleasant, NAD. Neuro: Alert and oriented X 3. Moves all extremities spontaneously. Psych: Normal affect. HEENT:  Normal  Neck: Supple without bruits or JVD. Lungs:  Resp regular and unlabored. Markedly decreased breath sound with some mild wheezing Heart: RRR no s3, s4, or murmurs. Abdomen: Soft, non-tender, non-distended, BS + x 4.  Extremities: No clubbing, cyanosis or edema. DP/PT/Radials 2+ and equal bilaterally.  Accessory Clinical Findings  CBC  Recent Labs  05/23/15 1722  05/25/15 0645 05/26/15 0450  WBC 13.5*  < > 6.3 6.3  NEUTROABS 12.9*  --  5.4  --   HGB 8.7*  < > 8.3* 8.7*  HCT 26.7*  < > 25.8* 27.3*  MCV 86.7  < > 86.6 88.1  PLT 124*  < > 214 195  < > = values in this interval not displayed. Basic Metabolic Panel  Recent Labs  05/25/15 0645 05/26/15 0450  NA 137 137  K 3.9 3.5    CL 95* 95*  CO2 31 35*  GLUCOSE 285* 172*  BUN 8 8  CREATININE 0.63 0.48*  CALCIUM 8.3* 8.4*   Liver Function Tests  Recent Labs  05/23/15 1722  AST 24  ALT 21  ALKPHOS 101  BILITOT 0.5  PROT 5.2*  ALBUMIN 2.4*   Cardiac Enzymes  Recent Labs  05/24/15 1800 05/25/15 0100 05/25/15 0645  TROPONINI 0.04* 0.04* 0.03    TELE NSR with HR 80s, occasional PVCs    ECG  No new EKG  Echocardiogram 05/26/2015  LV EF: 45% -  50%  ------------------------------------------------------------------- Indications:   Chest pain 786.51. Pericardial effusion 423.9.  ------------------------------------------------------------------- History:  PMH:  Coronary artery disease. Congestive heart failure. Congestive heart failure. Chronic obstructive pulmonary disease. PMH:  Myocardial infarction. Risk factors: Hypertension. Diabetes  mellitus.  ------------------------------------------------------------------- Study Conclusions  - Left ventricle: The cavity size was mildly dilated. Systolic function was mildly reduced. The estimated ejection fraction was in the range of 45% to 50%. There is hypokinesis of the inferior myocardium. Doppler parameters are consistent with abnormal left ventricular relaxation (grade 1 diastolic dysfunction). - Aortic valve: Trileaflet; mildly thickened, moderately calcified leaflets. Transvalvular velocity was minimally increased. There was no stenosis. There was mild regurgitation. Valve area (Vmax): 2.18 cm^2.  Impressions:  - Compared to the prior study, there has been no significant interval change.    Radiology/Studies  Dg Chest 2 View  05/15/2015   CLINICAL DATA:  Pneumonia and right upper lobe abscess.  EXAM: CHEST - 2 VIEW  COMPARISON:  05/14/2015  FINDINGS: The anterior right upper lobe abscess containing an air-fluid level likely has enlarged with maximum diameter approaching 10 cm in the lateral projection. There is more fluid in the abscess with air-fluid level visualized dependently. This abscess developed quite quickly and was not present on 05/04/2015. Followup CT of the chest would be helpful for further characterization, as there may be a component of bronchopleural fistula or overt communication with a bronchus. No pneumothorax is identified.  No pulmonary edema or significant pleural fluid identified. The heart size and mediastinal contours remain normal.  IMPRESSION: Enlarging right upper lobe pulmonary abscess. Followup CT of the chest would be helpful for further characterization, as there may be an overt communication with a bronchus or component of bronchopleural fistula.   Electronically Signed   By: Glenn  Yamagata M.D.   On: 05/15/2015 08:09   Dg Chest 2 View  05/14/2015   CLINICAL DATA:  Chest pain and cough.  EXAM: CHEST  2 VIEW  COMPARISON:   05/12/2015.  05/04/2015.  CT 03/20/2015 .  FINDINGS: Left PICC line stable position. Mediastinum hilar structures are normal. Cavitary lesion containing a small amount of fluid is noted in the right upper lobe. This could represent fluid within a bullae secondary to infection. Developing abscess cannot be excluded. No pleural effusion or pneumothorax. Stable cardiomegaly with normal pulmonary vascularity. Diffuse multilevel degenerative change with multiple compression fractures again noted.  IMPRESSION: 1.  Left PICC line stable position.  2. Right upper lobe bullae containing fluid, possibly secondary to infection. Developing pulmonary abscess cannot be excluded .   Electronically Signed   By: Thomas  Register   On: 05/14/2015 08:24   Dg Thoracic Spine 2 View  05/15/2015   CLINICAL DATA:  Diffuse back pain following a recent fall. History of chronic mid back pain.  EXAM: THORACIC SPINE - 2 VIEW  COMPARISON:  Chest CT obtained earlier today.  FINDINGS: Previously noted multiple chronic thoracic and   upper lumbar vertebral compression deformities. No acute fractures or subluxations seen. One of the lower thoracic vertebral compression deformities has a linear vacuum phenomenon beneath the superior endplate, unchanged from the CT earlier. Left PICC tip in the inferior aspect of the superior vena cava. Thoracic and upper lumbar spine degenerative changes. The previously noted right upper lobe cavitary lesion is again demonstrated.  IMPRESSION: 1. Stable multiple thoracic and upper lumbar vertebral compression deformities compared to the CT earlier today. These were also previously compared to previous radiographs and shown to be stable. 2. Left PICC tip in the inferior aspect of the superior vena cava. If a position at the cavoatrial junction is desired, it would be recommended that this be advanced 2 cm. 3. Stable right upper lobe cavitary lesion.   Electronically Signed   By: Steven  Reid M.D.   On: 05/15/2015  21:41   Ct Chest W Contrast  05/15/2015   CLINICAL DATA:  Abnormal chest radiograph, right upper lobe pneumonia  EXAM: CT CHEST WITH CONTRAST  TECHNIQUE: Multidetector CT imaging of the chest was performed during intravenous contrast administration.  CONTRAST:  75mL OMNIPAQUE IOHEXOL 300 MG/ML  SOLN  COMPARISON:  Chest radiograph dated 05/15/2015. CTA chest dated 03/20/2015.  FINDINGS: Mediastinum/Nodes: Heart is normal in size. No pericardial effusion.  Coronary atherosclerosis. Postsurgical changes related to prior CABG.  Small mediastinal lymph nodes, including a 9 mm short axis right paratracheal node (series 2/ image 26), likely reactive.  Visualized thyroid is unremarkable.  Lungs/Pleura: 6.7 x 8.5 x 7.7 cm mildly thick-walled cavitary lesion in the anterior/medial right upper lobe (series 3/image 23 with associated air-fluid level and surrounding ground-glass opacity/inflammatory changes. Lesion was not present on prior CT. These findings are most suggestive of pulmonary abscess.  Underlying mild emphysematous changes.  No pleural effusion or pneumothorax.  Upper abdomen: Visualized upper abdomen is notable for vascular calcifications.  Musculoskeletal: Degenerative changes of the visualized thoracolumbar spine.  Multiple mild to moderate thoracic compression fracture deformities involving the mid to lower thoracic spine, chronic.  IMPRESSION: 8.5 cm cavitary lesion with air-fluid level and surrounding inflammatory changes in the medial right upper lobe, new from prior CT, compatible with pulmonary abscess.  9 mm short axis right paratracheal node, likely reactive.  Consider follow-up CT chest in 4-6 weeks to document improvement/resolution.   Electronically Signed   By: Sriyesh  Krishnan M.D.   On: 05/15/2015 13:56   Dg Chest Port 1 View  05/23/2015   CLINICAL DATA:  Acute chest pain today with chest congestion, shortness of breath, and wheezing. Left arm numbness.  EXAM: PORTABLE CHEST - 1 VIEW   COMPARISON:  05/16/2015, 04/03/2015 and chest CT dated 05/15/2015  FINDINGS: PICC tip just above the cavoatrial junction, unchanged. There are new small Kerley B-lines at the left base and there is new slight atelectasis at the right base. Heart size and pulmonary vascularity are normal. Right lung abscess is smaller than on the prior study. No acute osseous abnormality.  IMPRESSION: 1. Decreased cavitary right upper lobe  lung abscess. 2. Bibasilar atelectasis with possible interstitial edema at the left lung base.   Electronically Signed   By: James  Maxwell M.D.   On: 05/23/2015 17:51   Dg Chest Port 1 View  05/16/2015   CLINICAL DATA:  Shortness of breath, respiratory failure  EXAM: PORTABLE CHEST - 1 VIEW  COMPARISON:  05/15/2015  FINDINGS: Medial right upper lobe large cavitary mass again evident consistent with a pulmonary abscess when compared to yesterday's   chest CT. This measures approximate 8.8 x 8.3 cm by plain radiography. Left lung remains clear. No developing effusion. Negative for pneumothorax. Trachea midline. Normal heart size and vascularity. Left PICC line tip mid SVC level.  IMPRESSION: Medial right upper lobe large cavitary mass compatible with a pulmonary abscess. No new finding by plain radiography.   Electronically Signed   By: M.  Shick M.D.   On: 05/16/2015 07:56   Dg Chest Port 1 View  05/12/2015   CLINICAL DATA:  Dyspnea  EXAM: PORTABLE CHEST - 1 VIEW  COMPARISON:  05/04/2015  FINDINGS: New ill-defined airspace disease in the right upper lung.  Background of hyperinflation and emphysematous change. No edema, effusion, or pneumothorax.  Normal heart size and mediastinal contours.  IMPRESSION: 1. Right upper lobe pneumonia, new from 05/04/2015. 2. Emphysema.   Electronically Signed   By: Jonathon  Watts M.D.   On: 05/12/2015 03:14   Dg Chest Portable 1 View  05/05/2015   CLINICAL DATA:  Chest pain and shortness of breath.  EXAM: PORTABLE CHEST - 1 VIEW  COMPARISON:  04/21/2015   FINDINGS: Lungs remain hyperinflated. The cardiomediastinal contours are unchanged, heart at the upper limits of normal. Bibasilar atelectasis or scarring is again seen. Pulmonary vasculature is normal. No consolidation, pleural effusion, or pneumothorax. No acute osseous abnormalities are seen.  IMPRESSION: Unchanged hyperinflation without acute pulmonary process.   Electronically Signed   By: Melanie  Ehinger M.D.   On: 05/05/2015 00:45    ASSESSMENT AND PLAN  63 yo male with CAD, chronic diastolic HF, end stage COPD on chronic O2, HTN, HLD and recent admission for ARF in the setting of HCAP and RUL abscess readmitted for CP and dyspnea. Also noted to have NSTEMI in the setting of severe anemia. CXR shows chronic lung abcess, CT surgery consulted, pt not a surgical candidate.   1. NSTEMI, in the setting of severe anemia and known underlying residual coronary stenosis  - hgb 6.9 improved to 8.7, serial trop trending down  - repeat echo 05/24/2015 EF 45-50%, hypokinesis of inferior myocardium, grade 1 diastolic dysfunction, mild AR, no pericardial effusion. Previously echo 2 weeks ago 5/18 EF 60-65%, no RWMA, grade 1 diastolic dysfunction, small pericardial effusion.   - not ideal candidate given recent anemia, however with EF down compare to previous echo 2 wks ago and new anterolateral TWI, pending LHC today, currently on DAPT despite recent severe anemia given PCI in Feb with DES  - Risk and benefit of procedure explained to the patient who display clear understanding and agree to proceed. Discussed with patient possible procedural risk include bleeding, vascular injury, renal injury, arrythmia, MI, stroke and loss of limb or life.   2. CAD  - PCI to RCA in 2005  - LHC 2008 PCI to occluded mid LCx, 30-40% ost LCx, 70% ost RI (treated with medical therapy), 40% prox RCA and 30% ost PDA stenosis, previous RCA stent patent.   - LHC at [place] with DES to RCA and LCx on 02/07/2015, EF 40-45%  - Echo  05/07/15 normal LVSF with EF of 60-65%, G1DD and no WMA  3. Chronic RUL abcess (admitted from 05/04/15 to 05/19/15 for resp failure and same lung abcess discharged on IV abx)  - seen for the same during last admission, evaluated by Pulmonology and CT surgery who both determined he was not a candidate for surgery due to his severe COPD  - per CT surgery, may consider percu drain if does not resolve on   abx  4. End stage COPD on home O2 5. Chronic diastolic HF 6. HTN 7. HLD 8. DM 9. H/o CVA 10. H/o LE DVT: previously on eliquis, stopped after GI bleed in early 2016 11. Anemia: reportedly had endoscopy and colonoscopy without significant finding.  Signed, Meng, Hao PA-C Pager: [phone]   I have examined the patient and reviewed assessment and plan and discussed with patient.  Agree with above as stated.   He is had multiple PCI's performed at [facility] in the past. He was seen in [place]. He was told that he had left lower extremity DVT. He did have a GI bleed but he states this was in the setting of being on aspirin, Plavix, Eliquis and Lovenox injections. The Lovenox and Eliquis were stopped. He states that he had endoscopy and that he has been relatively stable ever since just on aspirin and Plavix. He also mentioned that there is right lower extremity DVT present but this appeared old so his doctor in [place] did not feel this required treatment with anticoagulation.  He has recent stents. Hopefully, they will be patent. He is high risk for long-term anticoagulation but is committed to this already due to his recent stents. Would consider using a Synergy stent if something is required, just in the event that his antiplatelets therapy has to be stopped early.  His chest pain is somewhat atypical. He describes it as most severe when he turns a certain way. He states that it feels different than the lung issues that he has had. He has had successful right radial cath in the past. Given his  anemia, would plan on right radial approach.  Ameen Mostafa S.  

## 2015-05-26 NOTE — Clinical Social Work Placement (Signed)
   CLINICAL SOCIAL WORK PLACEMENT  NOTE  Date:  05/26/2015  Patient Details  Name: [name] MRN: [id number] Date of Birth: 05/30/1951  Clinical Social Work is seeking post-discharge placement for this patient at the Skilled  Nursing Facility level of care (*CSW will initial, date and re-position this form in  chart as items are completed):  Yes   Patient/family provided with [facility] Clinical Social Work Department's list of facilities offering this level of care within the geographic area requested by the patient (or if unable, by the patient's family).  Yes   Patient/family informed of their freedom to choose among providers that offer the needed level of care, that participate in Medicare, Medicaid or managed care program needed by the patient, have an available bed and are willing to accept the patient.  Yes   Patient/family informed of [facility]'s ownership interest in [facility] and [facility], as well as of the fact that they are under no obligation to receive care at these facilities.  PASRR submitted to EDS on  (n/a)     PASRR number received on  (n/a)     Existing PASRR number confirmed on 05/26/15     FL2 transmitted to all facilities in geographic area requested by pt/family on 05/26/15     FL2 transmitted to all facilities within larger geographic area on  (n/a)     Patient informed that his/her managed care company has contracts with or will negotiate with certain facilities, including the following:   (yes)         Patient/family informed of bed offers received.  Patient chooses bed at       Physician recommends and patient chooses bed at      Patient to be transferred to   on  .  Patient to be transferred to facility by       Patient family notified on   of transfer.  Name of family member notified:        PHYSICIAN   Additional Comment:    _______________________________________________ Rod MaeVaughn, Verner Kopischke S, LCSW 05/26/2015, 11:55  AM [phone]

## 2015-05-26 NOTE — Progress Notes (Signed)
PULMONARY / CRITICAL CARE MEDICINE   Name: [name] MRN: [id number] DOB: 03/05/1951    ADMISSION DATE:  05/23/2015 CONSULTATION DATE:  05/25/15   REFERRING MD :  Bennie PieriniHongagli / [facility]  CHIEF COMPLAINT:  L chest pain   INITIAL PRESENTATION:  [age] yowm with severe copd /steroid /02 dep/ no pfts in EPIC/  s/p smoking cessation "about 6 m" pta with clear cxr in early May 2016 then evolving RUL cavitary dz late May with MRSA in sputum with rec for IV Vanc then bactrim so d/c to SNF 5/30 readmitted to [facility] 6/3 with non-pleuritic L chest pain with suspected IHD and f/u for R sided abscess requested by [facility] am 6/5  STUDIES:  6/3  CXR >> decreased cavitary RUL lung abscess, bibasilar atx 6/4  ECHO >> no sig change from prior ECHO, LV mild dilation, EF 45-50%, grade 1 DD  SIGNIFICANT EVENTS:     SUBJECTIVE:  Pt reports he feels breathing is at baseline, feels chest pain was cardiac in nature at this point.  Pending cardiac cath   VITAL SIGNS: Temp:  [97.9 F (36.6 C)-98.6 F (37 C)] 97.9 F (36.6 C) (06/06 0400) Pulse Rate:  [69-84] 69 (06/06 0932) Resp:  [13-19] 16 (06/06 0932) BP: (140-159)/(72-83) 147/72 mmHg (06/06 0932) SpO2:  [95 %-100 %] 100 % (06/06 0932)     Intake/Output Summary (Last 24 hours) at 05/26/15 1141 Last data filed at 05/26/15 0500  Gross per 24 hour  Intake    912 ml  Output   1200 ml  Net   -288 ml    PHYSICAL EXAMINATION: General:  disheveled elderly WM, appears older than stated age/gruff voice HEENT: MM pink/moist, no jvd, thick beard, trachea midline CV: s1s2 rrr, no m/r/g Resp: even/non-labored, lungs bilaterally diminished with no wheezing  GI: obese, soft, bsx4 active Skin: warm/dry, fragile / thin with multiple bruises Extremities:  Trace LE edema, no cyanosis or clubbing    LABS:  CBC  Recent Labs Lab 05/24/15 0415 05/24/15 1610 05/25/15 0645 05/26/15 0450  WBC 6.9  --  6.3 6.3  HGB 6.9* 9.1* 8.3* 8.7*  HCT 21.4* 28.6*  25.8* 27.3*  PLT 178  --  214 195   Coag's  Recent Labs Lab 05/23/15 2210 05/26/15 0450  APTT 25  --   INR 0.94 0.91   BMET  Recent Labs Lab 05/24/15 0415 05/25/15 0645 05/26/15 0450  NA 139 137 137  K 3.1* 3.9 3.5  CL 102 95* 95*  CO2 29 31 35*  BUN 7 8 8   CREATININE 0.37* 0.63 0.48*  GLUCOSE 152* 285* 172*   Electrolytes  Recent Labs Lab 05/24/15 0415 05/25/15 0645 05/26/15 0450  CALCIUM 7.0* 8.3* 8.4*   Sepsis Markers  Recent Labs Lab 05/23/15 1731 05/23/15 2210 05/24/15 0120  LATICACIDVEN 2.93* 3.0* 1.7  PROCALCITON  --  0.33  --    ABG  Recent Labs Lab 05/23/15 2135  PHART 7.409  PCO2ART 52.2*  PO2ART 113*   Liver Enzymes  Recent Labs Lab 05/23/15 1722  AST 24  ALT 21  ALKPHOS 101  BILITOT 0.5  ALBUMIN 2.4*   Cardiac Enzymes  Recent Labs Lab 05/24/15 1800 05/25/15 0100 05/25/15 0645  TROPONINI 0.04* 0.04* 0.03   Glucose  Recent Labs Lab 05/24/15 2134 05/25/15 0735 05/25/15 1127 05/25/15 1617 05/25/15 2123 05/26/15 0749  GLUCAP 177* 221* 154* 198* 213* 131*    Imaging No results found.   ASSESSMENT / PLAN:  1) Severe  O2/Steroid Dependent COPD - Continue duoneb QID + PRN albuterol  - Pulmicort, flonase,  - Prednisone  QD, consider very slow wean to lowest effective dose  2) Probable MRSA PNA / abscess and cyst/bulla formation  - no role for percutaneous drainage which risks bp fistula/ empypema. - IV vanc ok while in hospital then bactrim then OV with Pulmonary/ [place] (arranged) prior to stopping the bactrim  3) Chronic Hypercarbic / Hypoxemic Resp Failure - Well compensated > titrate 02 to sats 90 -95%   4) HBP/ Possible Angina -  Bisoprolol   -  Pending LHC 6/6   PCCM will be available PRN.  Follow up appointment arranged with Dr. Delton Coombes.  Please call if we can be of further assistance.    Canary Brim, NP-C [facility] Pgr: [phone] or [phone]

## 2015-05-26 NOTE — Progress Notes (Signed)
Obtained records from [facility].  Records in pt chart.

## 2015-05-26 NOTE — Care Management (Signed)
Medicare Important Message given? YES   (If response is "NO", the following Medicare IM given date fields will be blank)   Date Medicare IM given:  05-26-15 Medicare IM given by: Graves-Bigelow, Lisl Slingerland  

## 2015-05-26 NOTE — Interval H&P Note (Signed)
Cath Lab Visit (complete for each Cath Lab visit)  Clinical Evaluation Leading to the Procedure:   ACS: No.  Non-ACS:    Anginal Classification: CCS IV  Anti-ischemic medical therapy: Maximal Therapy (2 or more classes of medications)  Non-Invasive Test Results: No non-invasive testing performed  Prior CABG: No previous CABG      History and Physical Interval Note:  05/26/2015 2:06 PM  [name]  has presented today for surgery, with the diagnosis of unstable angina  The various methods of treatment have been discussed with the patient and family. After consideration of risks, benefits and other options for treatment, the patient has consented to  Procedure(s): Left Heart Cath and Coronary Angiography (N/A) as a surgical intervention .  The patient's history has been reviewed, patient examined, no change in status, stable for surgery.  I have reviewed the patient's chart and labs.  Questions were answered to the patient's satisfaction.     KELLY,THOMAS A

## 2015-05-26 NOTE — Progress Notes (Signed)
Patient is refusing CPAP for tonight. RT made patient aware that if he changed his mind to call.  

## 2015-05-26 NOTE — Evaluation (Addendum)
Occupational Therapy Evaluation Patient Details Name: [name] MRN: [id number] DOB: 12-Sep-1951 Today's Date: 05/26/2015    History of Present Illness [name]. is a 64 y.o. male with recent hospitalization for R upper lobe abcess with new onset of chest pain and SOB.  History of CAD, O2 dependent COPD, Diastolic CHF, HTN, DM2. Not a surgical candidate   Clinical Impression   Pt admitted with above. Pt reports he could perform ADLs, PTA. Feel pt will benefit from acute OT to increase independence, activity tolerance, and strength prior to d/c. Recommending SNF, however pt refusing this, so recommend HHOT upon d/c.     Follow Up Recommendations  SNF;Supervision/Assistance - 24 hour    Equipment Recommendations  None recommended by OT    Recommendations for Other Services       Precautions / Restrictions Precautions Precautions: Fall Restrictions Weight Bearing Restrictions: No      Mobility Bed Mobility Overal bed mobility: Needs Assistance Bed Mobility: Supine to Sit;Sit to Supine     Supine to sit: Supervision Sit to supine: Mod assist   General bed mobility comments: assist with LEs.   Transfers Overall transfer level: Needs assistance   Transfers: Sit to/from Stand Sit to Stand: Min assist;From elevated surface         General transfer comment: assist to boost. Cues for hand placement.    Balance  No LOB sitting EOB. Balance not formally assessed.                                          ADL Overall ADL's : Needs assistance/impaired     Grooming: Wash/dry face;Set up;Bed level           Upper Body Dressing : Set up;Supervision/safety;Sitting   Lower Body Dressing: Maximal assistance;Sit to/from stand   Toilet Transfer: Minimal assistance;RW (sit to stand from elevated bed)             General ADL Comments: Educated on energy conservation techniques and reviewed deep breathing technique.      Vision     Perception     Praxis      Pertinent Vitals/Pain Pain Assessment: 0-10 Pain Score:  (5-6) Pain Location: chest muscles Pain Descriptors / Indicators: Sore Pain Intervention(s): Monitored during session;Other (comment) (notified nurse)  O2 remained in 90s on 3L of O2.     Hand Dominance Right   Extremity/Trunk Assessment Upper Extremity Assessment Upper Extremity Assessment: Generalized weakness   Lower Extremity Assessment Lower Extremity Assessment: Defer to PT evaluation       Communication Communication Communication: No difficulties   Cognition Arousal/Alertness: Awake/alert Behavior During Therapy: WFL for tasks assessed/performed Overall Cognitive Status: Within Functional Limits for tasks assessed                     General Comments       Exercises  Educated on UE/LE exercises pt can be doing.     Shoulder Instructions      Home Living Family/patient expects to be discharged to:: Private residence Living Arrangements: Other relatives (sister-not in great health) Available Help at Discharge: Available 24 hours/day Type of Home: House Home Access: Level entry     Home Layout: One level     Bathroom Shower/Tub: Chief Strategy Officer: Standard     Home Equipment: Bedside commode;Shower seat;Tub bench;Walker - 2  wheels;Cane - single point;Grab bars - tub/shower;Adaptive equipment Adaptive Equipment: Reacher;Long-handled sponge        Prior Functioning/Environment Level of Independence: Needs assistance  Gait / Transfers Assistance Needed: unable since last admission ADL's / Homemaking Assistance Needed: reports he has been sponge bathing and could dress himself        OT Diagnosis: Generalized weakness;Acute pain   OT Problem List: Decreased strength;Cardiopulmonary status limiting activity;Decreased knowledge of precautions;Decreased knowledge of use of DME or AE;Pain;Increased edema;Decreased range of motion;Decreased  activity tolerance   OT Treatment/Interventions: Self-care/ADL training;DME and/or AE instruction;Therapeutic activities;Balance training;Patient/family education;Therapeutic exercise;Energy conservation    OT Goals(Current goals can be found in the care plan section) Acute Rehab OT Goals Patient Stated Goal: go home OT Goal Formulation: With patient Time For Goal Achievement: 06/02/15 Potential to Achieve Goals: Good ADL Goals Pt Will Perform Lower Body Bathing: with min guard assist;sit to/from stand;with adaptive equipment Pt Will Perform Lower Body Dressing: sit to/from stand;with min guard assist;with adaptive equipment Pt Will Transfer to Toilet: with min guard assist;ambulating;bedside commode Pt Will Perform Toileting - Clothing Manipulation and hygiene: with set-up;sit to/from stand Additional ADL Goal #1: Pt will independently verbalize 3/3 energy conservation techniques and utilize duirng ADLs/functional tasks. Additional ADL Goal #2: Pt will independently perform HEP for bilateral UEs to increase strength.  OT Frequency: Min 2X/week   Barriers to D/C:            Co-evaluation              End of Session Equipment Utilized During Treatment: Gait belt;Rolling walker;Oxygen Nurse Communication: Patient requests pain meds  Activity Tolerance: Patient tolerated treatment well Patient left: in bed;with call bell/phone within reach   Time: 1102-1120 OT Time Calculation (min): 18 min Charges:  OT General Charges $OT Visit: 1 Procedure OT Evaluation $Initial OT Evaluation Tier I: 1 Procedure G-CodesEarlie Raveling:    Danne Vasek L OTR/L [phone] 05/26/2015, 1:25 PM

## 2015-05-27 ENCOUNTER — Inpatient Hospital Stay (HOSPITAL_COMMUNITY): Payer: Medicare Other

## 2015-05-27 DIAGNOSIS — J9621 Acute and chronic respiratory failure with hypoxia: Secondary | ICD-10-CM

## 2015-05-27 DIAGNOSIS — J438 Other emphysema: Secondary | ICD-10-CM

## 2015-05-27 DIAGNOSIS — I214 Non-ST elevation (NSTEMI) myocardial infarction: Principal | ICD-10-CM

## 2015-05-27 DIAGNOSIS — R0789 Other chest pain: Secondary | ICD-10-CM

## 2015-05-27 LAB — BASIC METABOLIC PANEL
Anion gap: 7 (ref 5–15)
CALCIUM: 8.3 mg/dL — AB (ref 8.9–10.3)
CHLORIDE: 99 mmol/L — AB (ref 101–111)
CO2: 34 mmol/L — ABNORMAL HIGH (ref 22–32)
Creatinine, Ser: 0.41 mg/dL — ABNORMAL LOW (ref 0.61–1.24)
GFR calc Af Amer: >60 mL/min (ref >60)
GFR calc non Af Amer: >60 mL/min (ref >60)
Glucose, Bld: 121 mg/dL — ABNORMAL HIGH (ref 65–99)
POTASSIUM: 3.4 mmol/L — AB (ref 3.5–5.1)
Sodium: 140 mmol/L (ref 135–145)

## 2015-05-27 LAB — BLOOD GAS, ARTERIAL
Acid-Base Excess: 11.2 mmol/L — ABNORMAL HIGH (ref 0.0–2.0)
Bicarbonate: 36.3 mEq/L — ABNORMAL HIGH (ref 20.0–24.0)
FIO2: 0.55 %
O2 Saturation: 82.8 %
Patient temperature: 98.6
TCO2: 38 mmol/L (ref 0–100)
pCO2 arterial: 57.9 mmHg (ref 35.0–45.0)
pH, Arterial: 7.413 (ref 7.350–7.450)
pO2, Arterial: 45.9 mmHg — ABNORMAL LOW (ref 80.0–100.0)

## 2015-05-27 LAB — EXPECTORATED SPUTUM ASSESSMENT W REFEX TO RESP CULTURE

## 2015-05-27 LAB — CBC
HEMATOCRIT: 26.8 % — AB (ref 39.0–52.0)
Hemoglobin: 8.5 g/dL — ABNORMAL LOW (ref 13.0–17.0)
MCH: 28.2 pg (ref 26.0–34.0)
MCHC: 31.7 g/dL (ref 30.0–36.0)
MCV: 89 fL (ref 78.0–100.0)
PLATELETS: 169 10*3/uL (ref 150–400)
RBC: 3.01 MIL/uL — AB (ref 4.22–5.81)
RDW: 16.6 % — ABNORMAL HIGH (ref 11.5–15.5)
WBC: 6.2 10*3/uL (ref 4.0–10.5)

## 2015-05-27 LAB — EXPECTORATED SPUTUM ASSESSMENT W GRAM STAIN, RFLX TO RESP C

## 2015-05-27 LAB — GLUCOSE, CAPILLARY
Glucose-Capillary: 116 mg/dL — ABNORMAL HIGH (ref 65–99)
Glucose-Capillary: 182 mg/dL — ABNORMAL HIGH (ref 65–99)
Glucose-Capillary: 143 mg/dL — ABNORMAL HIGH (ref 65–99)
Glucose-Capillary: 82 mg/dL (ref 65–99)

## 2015-05-27 MED ORDER — POTASSIUM CHLORIDE CRYS ER 20 MEQ PO TBCR
40.0000 meq | EXTENDED_RELEASE_TABLET | Freq: Once | ORAL | Status: AC
Start: 2015-05-27 — End: 2015-05-27
  Administered 2015-05-27: 40 meq via ORAL
  Filled 2015-05-27: qty 2

## 2015-05-27 MED ORDER — VANCOMYCIN HCL 10 G IV SOLR
1250.0000 mg | Freq: Two times a day (BID) | INTRAVENOUS | Status: AC
  Administered 2015-05-27: 1250 mg via INTRAVENOUS
  Filled 2015-05-27: qty 1250

## 2015-05-27 MED ORDER — FUROSEMIDE 10 MG/ML IJ SOLN
INTRAMUSCULAR | Status: AC
  Administered 2015-05-27: 20 mg
  Filled 2015-05-27: qty 2

## 2015-05-27 MED ORDER — LIDOCAINE HCL (PF) 1 % IJ SOLN
INTRAMUSCULAR | Status: AC
  Filled 2015-05-27: qty 10

## 2015-05-27 MED ORDER — FUROSEMIDE 10 MG/ML IJ SOLN
40.0000 mg | Freq: Once | INTRAMUSCULAR | Status: AC
  Administered 2015-05-27: 40 mg via INTRAVENOUS
  Filled 2015-05-27: qty 4

## 2015-05-27 MED ORDER — ALBUTEROL SULFATE (2.5 MG/3ML) 0.083% IN NEBU
2.5000 mg | INHALATION_SOLUTION | RESPIRATORY_TRACT | Status: AC
  Administered 2015-05-27: 2.5 mg via RESPIRATORY_TRACT

## 2015-05-27 MED ORDER — SULFAMETHOXAZOLE-TRIMETHOPRIM 800-160 MG PO TABS
1.0000 | ORAL_TABLET | Freq: Two times a day (BID) | ORAL | Status: DC
  Administered 2015-05-28 – 2015-06-03 (×13): 1 via ORAL
  Filled 2015-05-27 (×14): qty 1

## 2015-05-27 MED ORDER — IPRATROPIUM-ALBUTEROL 0.5-2.5 (3) MG/3ML IN SOLN
3.0000 mL | Freq: Once | RESPIRATORY_TRACT | Status: AC
  Administered 2015-05-27: 3 mL via RESPIRATORY_TRACT
  Filled 2015-05-27: qty 3

## 2015-05-27 MED ORDER — PRAVASTATIN SODIUM 20 MG PO TABS
20.0000 mg | ORAL_TABLET | Freq: Every day | ORAL | Status: DC
  Administered 2015-05-27 – 2015-06-02 (×7): 20 mg via ORAL
  Filled 2015-05-27 (×8): qty 1

## 2015-05-27 MED ORDER — IPRATROPIUM-ALBUTEROL 0.5-2.5 (3) MG/3ML IN SOLN: 3.0000 mL | RESPIRATORY_TRACT | Status: AC

## 2015-05-27 MED ORDER — FUROSEMIDE 10 MG/ML IJ SOLN
INTRAMUSCULAR | Status: AC
  Filled 2015-05-27: qty 8

## 2015-05-27 MED ORDER — ZOLPIDEM TARTRATE 5 MG PO TABS
5.0000 mg | ORAL_TABLET | Freq: Once | ORAL | Status: AC
  Administered 2015-05-27: 5 mg via ORAL
  Filled 2015-05-27: qty 1

## 2015-05-27 MED ORDER — MORPHINE SULFATE 2 MG/ML IJ SOLN
2.0000 mg | INTRAMUSCULAR | Status: DC | PRN
  Administered 2015-05-27 – 2015-06-03 (×32): 2 mg via INTRAVENOUS
  Filled 2015-05-27 (×33): qty 1

## 2015-05-27 MED ORDER — CETYLPYRIDINIUM CHLORIDE 0.05 % MT LIQD
7.0000 mL | Freq: Two times a day (BID) | OROMUCOSAL | Status: DC
  Administered 2015-05-27 – 2015-06-03 (×14): 7 mL via OROMUCOSAL

## 2015-05-27 NOTE — Progress Notes (Addendum)
PROGRESS NOTE    [name] ZOX:[id number] DOB: 03/22/1951 DOA: 05/23/2015 PCP: Jeanann Lewandowsky, MD  HPI/Brief narrative 64 year old male with history of CAD, COPD on home oxygen 3 L/m, chronic diastolic CHF (EF 81-19 percent by echo 5/18), HTN, DM 2, chronic pain, anemia, NSVT, morbid obesity tobacco abuse, recent hospitalization 05/04/15-05/19/15 related to acute on chronic respiratory failure from COPD exacerbation, aspiration pneumonia and RUL abscess. TCTS consulted-not surgical candidate due to severe COPD and recommended drain placement. Eventually pulmonology recommended discharge on IV vancomycin 1 week followed by Bactrim PO 10 days and outpatient follow-up with pulmonology. Patient readmitted on 05/23/15 for left-sided chest pain.   Assessment/Plan:  NSTEMI - Mildly elevated troponin but with downward trend, 0.11 > 0.08 > 0.06 >then decreased gradually to normal - New EKG changes/anterolateral T-wave inversions on 6/4 compared to the day prior. - Repeat 2-D echo 6/4: LVEF 45-50 percent, hypokinesis of the inferior myocardium and grade 1 diastolic dysfunction and no pericardial effusion. EF has worsened since echo 5/18 - Likely precipitated by severe anemia in the context of underlying CAD - Transfuse to keep hemoglobin >8 g per DL. - Continue aspirin, Plavix and beta blockers. Intolerant to statins. No IV heparin secondary to severe anemia.  - Denied chest pain today - Status post cardiac cath 05/26/15: Results as below. Medical therapy recommended. - Discussed with Dr.Varanasi - As per cardiology: Continue dual antiplatelet therapy, bisoprolol. Low-dose statins added (Lipitor listed as allergy and hence not on statin before-started on pravastatin by cardiology). Consider addition of ACEI if blood pressure able to tolerate  Acute on chronic anemia - Hemoglobin lately has been in the 8 g per DL range. - Hb dropped from 8.7 > 6.9 in 12 hours in the absence of overt bleeding or  melena.? Accurate - s/p 1 unit of PRBC 6/4 - Improved and stable with hemoglobin >8 last 3 days. - Periodically outpatient follow-up.  Mild acute on chronic combined diastolic/new systolic CHF - Lasix posttransfusion and monitor - On 6/7, patient noted to be more dyspneic-likely multifactorial. Does have trace bilateral leg edema. Will provide a dose of Lasix 40 mg IV 1 and monitor.  Severe COPD (oxygen and steroid dependent) with exacerbation/acute on chronic respiratory failure on home oxygen 3 L/m - Continue oxygen and bronchodilators - Steroids switched to oral by pulmonology (Prednsione 20 mg daily). Complaints IV vancomycin for lung abscess on 05/27/15 and supposed to start oral Bactrim 05/28/15 - Pulmonary follow-up 6/6 appreciated: Recommend continuing DuoNeb QID + when necessary albuterol, continue Pulmicort, Flonase and slow prednisone taper to lowest effective dose. - Patient more dyspneic on 6/7. Chest x-ray without acute findings. Provided a dose of IV Lasix for possible volume overload. Continue breathing treatments and other management. Patient having more productive cough today.  MRSA RUL lung abscess/pneumonia - As per chest x-ray, decreased cavitatory RUL abscess - Per thoracic surgery: Nothing for them to offer at this time. - Pulmonary consultation appreciated: No role for percutaneous drainage which risks bronchopleural fistula/empyema. Continue IV vancomycin while hospitalized then oral Bactrim until outpatient follow-up with pulmonology/Dr. Delton Coombes - Blood Cultures 2: Negative to date - Patient completes IV vancomycin 05/27/15 and will start oral Bactrim 6/8-continue until outpatient follow-up with Dr. Delton Coombes. SNF to arrange for pulmonary follow-up.  Type II DM  - Continue low-dose Lantus and SSI . Fluctuating and mildly uncontrolled.  GERD  - Continue PPI   Hypokalemia - Replace and follow as needed  Chronic pain - Currently controlled  DVT  prophylaxis: Subcutaneous  heparin Code Status: Full Family Communication: None at bedside Disposition Plan: DC to SNF when medically stable-possibly in the next 24-48 hours.    Consultants  Cardiology  Procedures:  2-D echo 05/24/15: Study Conclusions  - Left ventricle: The cavity size was mildly dilated. Systolic function was mildly reduced. The estimated ejection fraction was in the range of 45% to 50%. There is hypokinesis of the inferior myocardium. Doppler parameters are consistent with abnormal left ventricular relaxation (grade 1 diastolic dysfunction). - Aortic valve: Trileaflet; mildly thickened, moderately calcified leaflets. Transvalvular velocity was minimally increased. There was no stenosis. There was mild regurgitation. Valve area (Vmax): 2.18 cm^2.  Impressions:  - Compared to the prior study, there has been no significant interval change.   Cardiac Cath 05/26/15: Conclusion     Ost 1st Mrg to 1st Mrg lesion, 20% stenosed.  1st Mrg lesion, 30% stenosed.  Mid LAD lesion, 30% stenosed.  Low normal LV function with an ejection fraction of 50-55% and mild inferior hypocontractility.  Nonobstructive evidence for coronary calcification and 30% narrowing proximal LAD septal perforating artery before the diagonal vessel; widely patent tandem stents in the circumflex marginal vessel with 30% narrowing beyond the stented segment prior to the OM bifurcation; and widely patent stent in the proximal dominant RCA.  RECOMMENDATION:  Medical therapy.    Antibiotics:  IV vancomycin 05/23/15 >  IV Zosyn 05/23/15 > 05/25/15   Subjective: Patient complaining of worsening dyspnea this morning. Denies chest pain. Cough productive of thick copious purulent green/yellow sputum. No hemoptysis.  Objective: Filed Vitals:   05/27/15 0829 05/27/15 1106 05/27/15 1110 05/27/15 1205  BP:   175/95 126/76  Pulse:   94 92  Temp:      TempSrc:      Resp:   21 20  Height:      Weight:       SpO2: 91% 92% 95% 100%    Intake/Output Summary (Last 24 hours) at 05/27/15 1422 Last data filed at 05/27/15 1301  Gross per 24 hour  Intake   2430 ml  Output   2750 ml  Net   -320 ml   Filed Weights   05/24/15 0431 05/25/15 0400 05/27/15 0400  Weight: 84.233 kg (185 lb 11.2 oz) 84.1 kg (185 lb 6.5 oz) 86.365 kg (190 lb 6.4 oz)     Exam:  General exam: Moderately built and nourished middle-aged male sitting propped up in bed with mild increased work of breathing.  Respiratory system: Reduced breath sounds bilaterally with scattered few bilateral medium pitched expiratory rhonchi. Mild increased work of breathing. Cardiovascular system: S1 & S2 heard, RRR. No JVD, murmurs, gallops, clicks. Trace bilateral pitting pedal edema. Telemetry: Sinus rhythm Gastrointestinal system: Abdomen is obese/nondistended, soft and nontender. Normal bowel sounds heard. Central nervous system: Alert and oriented. No focal neurological deficits. Extremities: Symmetric 5 x 5 power.   Data Reviewed: Basic Metabolic Panel:  Recent Labs Lab 05/23/15 1722 05/24/15 0415 05/25/15 0645 05/26/15 0450 05/27/15 0500  NA 129* 139 137 137 140  K 4.5 3.1* 3.9 3.5 3.4*  CL 88* 102 95* 95* 99*  CO2 35* 34*  GLUCOSE 136* 152* 285* 172* 121*  BUN <5*  CREATININE 0.60* 0.37* 0.63 0.48* 0.41*  CALCIUM 8.2* 7.0* 8.3* 8.4* 8.3*   Liver Function Tests:  Recent Labs Lab 05/23/15 1722  AST 24  ALT 21  ALKPHOS 101  BILITOT 0.5  PROT 5.2*  ALBUMIN 2.4*  No results for input(s): LIPASE, AMYLASE in the last 168 hours. No results for input(s): AMMONIA in the last 168 hours. CBC:  Recent Labs Lab 05/23/15 1722 05/24/15 0415 05/24/15 1610 05/25/15 0645 05/26/15 0450 05/27/15 0500  WBC 13.5* 6.9  --  6.3 6.3 6.2  NEUTROABS 12.9*  --   --  5.4  --   --   HGB 8.7* 6.9* 9.1* 8.3* 8.7* 8.5*  HCT 26.7* 21.4* 28.6* 25.8* 27.3* 26.8*  MCV 86.7 87.3  --  86.6 88.1 89.0  PLT 124* 178   --  214 195 169   Cardiac Enzymes:  Recent Labs Lab 05/24/15 0415 05/24/15 1255 05/24/15 1800 05/25/15 0100 05/25/15 0645  TROPONINI 0.08* 0.06* 0.04* 0.04* 0.03   BNP (last 3 results)  Recent Labs  10/27/14 2311  PROBNP 89.0   CBG:  Recent Labs Lab 05/26/15 1159 05/26/15 1636 05/26/15 2124 05/27/15 0753 05/27/15 1106  GLUCAP 153* 215* 149* 116* 182*    Recent Results (from the past 240 hour(s))  Blood Culture (routine x 2)     Status: None (Preliminary result)   Collection Time: 05/23/15  5:57 PM  Result Value Ref Range Status   Specimen Description BLOOD RIGHT ARM  Final   Special Requests BOTTLES DRAWN AEROBIC AND ANAEROBIC 5CC  Final   Culture   Final           BLOOD CULTURE RECEIVED NO GROWTH TO DATE CULTURE WILL BE HELD FOR 5 DAYS BEFORE ISSUING A FINAL NEGATIVE REPORT Performed at [facility]    Report Status PENDING  Incomplete  Blood Culture (routine x 2)     Status: None (Preliminary result)   Collection Time: 05/23/15  6:10 PM  Result Value Ref Range Status   Specimen Description BLOOD RIGHT ARM  Final   Special Requests BOTTLES DRAWN AEROBIC AND ANAEROBIC 5CC  Final   Culture   Final           BLOOD CULTURE RECEIVED NO GROWTH TO DATE CULTURE WILL BE HELD FOR 5 DAYS BEFORE ISSUING A FINAL NEGATIVE REPORT Performed at [facility]    Report Status PENDING  Incomplete  Urine culture     Status: None   Collection Time: 05/23/15  6:55 PM  Result Value Ref Range Status   Specimen Description URINE, RANDOM  Final   Special Requests NONE  Final   Colony Count NO GROWTH Performed at [facility]   Final   Culture NO GROWTH Performed at [facility]   Final   Report Status 05/24/2015 FINAL  Final  Culture, sputum-assessment     Status: None   Collection Time: 05/27/15 12:09 PM  Result Value Ref Range Status   Specimen Description SPU  Final   Special Requests NONE  Final   Sputum evaluation   Final    THIS  SPECIMEN IS ACCEPTABLE. RESPIRATORY CULTURE REPORT TO FOLLOW.   Report Status 05/27/2015 FINAL  Final         Studies: Dg Chest Port 1 View  05/27/2015   CLINICAL DATA:  Shortness of breath and respiratory distress  EXAM: PORTABLE CHEST - 1 VIEW  COMPARISON:  05/23/2015  FINDINGS: Cardiac shadow is stable. Cavitary lesion is again noted in the right upper lobe stable from the prior study. Previously seen atelectasis has resolved in the interval. No focal infiltrate is seen. No bony abnormality is noted.  IMPRESSION: Stable right upper lobe cavitary lesion.  No other focal abnormality is seen.  Electronically Signed   By: Alcide Clever M.D.   On: 05/27/2015 13:00        Scheduled Meds: . sodium chloride   Intravenous Once  . aspirin EC  81 mg Oral Daily  . bisoprolol  5 mg Oral BID  . budesonide (PULMICORT) nebulizer solution  0.25 mg Nebulization QID  . clopidogrel  75 mg Oral Q breakfast  . fentaNYL  12.5 mcg Transdermal Q72H  . fluticasone  2 spray Each Nare BID  . heparin  5,000 Units Subcutaneous 3 times per day  . insulin aspart  0-9 Units Subcutaneous TID WC  . insulin glargine  10 Units Subcutaneous Daily  . ipratropium-albuterol  3 mL Nebulization QID  . lidocaine (PF)      . nystatin  5 mL Oral QID  . pantoprazole  40 mg Oral BID AC  . pravastatin  20 mg Oral q1800  . predniSONE  20 mg Oral Q breakfast  . sodium chloride  3 mL Intravenous Q12H  . sodium chloride  3 mL Intravenous Q12H  . [START ON 05/28/2015] sulfamethoxazole-trimethoprim  1 tablet Oral Q12H  . vancomycin (VANCOCIN) 1250 mg IVPB  1,250 mg Intravenous Q12H   Continuous Infusions: . nitroGLYCERIN 10 mcg/min (05/23/15 2018)    Principal Problem:   Chest pain Active Problems:   Essential hypertension   Coronary atherosclerosis   Acute on chronic respiratory failure with hypoxia   COPD (chronic obstructive pulmonary disease)   Respiratory distress   OSA (obstructive sleep apnea)   History of CHF  (congestive heart failure)   DM (diabetes mellitus) type II uncontrolled, periph vascular disorder   Esophageal reflux   Lung abscess RUL   Sepsis   Chronic respiratory failure with hypercapnia   NSTEMI (non-ST elevated myocardial infarction)    Time spent: 25 minutes    Zyon Grout, MD, FACP, FHM. [facility] Pager [phone]  If 7PM-7AM, please contact night-coverage [email or web address] Password [email or web address] 05/27/2015, 2:22 PM    LOS: 4 days

## 2015-05-27 NOTE — Clinical Social Work Note (Signed)
Patient agreeable to [facility] once medically stable for discharge. CSW to continue to follow and assist with discharge planning needs.  Marcelline Deistmily Jla Reynolds, [place] - [phone] Clinical Social Work Department Orthopedics [phone]([id number]) and Surgical [phone]([id number])

## 2015-05-27 NOTE — Progress Notes (Signed)
Utilization review complete. Zayd Bonet RN CCM Case Mgmt phone [phone] 

## 2015-05-27 NOTE — Care Management Note (Signed)
Case Management Note  Patient Details  Name: Jailyn G Wickliff MRN: [id number] Haig ProphetDate of Birth: 07/07/1951  Subjective/Objective: Pt with uncontrolled DM and increased Cr.                   Action/Plan: CSW assisting with disposition needs. No further needs from CM at this time.    Expected Discharge Date:                  Expected Discharge Plan:  Skilled Nursing Facility  In-House Referral:  Clinical Social Work  Discharge planning Services  CM Consult  Post Acute Care Choice:    Choice offered to:     DME Arranged:    DME Agency:     HH Arranged:    HH Agency:     Status of Service:  Completed, signed off  Medicare Important Message Given:  Yes Date Medicare IM Given:  05/26/15 Medicare IM give by:  Tomi BambergerBrenda Graves-Bigelow, RN, BSN  Date Additional Medicare IM Given:    Additional Medicare Important Message give by:     If discussed at Long Length of Stay Meetings, dates discussed:    Additional Comments:  Gala LewandowskyGraves-Bigelow, Eryanna Regal Kaye, RN 05/27/2015, 1:33 PM

## 2015-05-27 NOTE — Progress Notes (Addendum)
Shift event: This NP paged by RN because pt was SOB and felt "like he was filling up with fluid again". Pt had just received a neb tx. Minor increased WOB. O2 sats normal at first, but began to decrease to low 80s on [place] and remained less than 88% on ventimask at 55%. NRB was placed by RN/RT and pt was satting 100%. Pt has CHF and chronic respiratory failure on O2 at home.  Pt received i U PRBC today with Lasix given after transfusion. Had good output on first shift after Lasix. No output yet on nights. This NP ordered r/p albuterol neb, ABG, Lasix 40mg  IV, and CXR then to bedside. S: already feeling better, less SOB. No air hunger now. Coughing up beige sputum. No chest pain. O: Unkempt, fairly well appearing male in no distress upon my arrival. VS reviewed. SaO2 100% on NRB. RR 24. HR 80s. BP 140s down to 110's after first dose of Lasix. Alert and oriented. Appropriate in speech and actions. Card: RRR. Lungs: congested, rhonchi. Coughing up moderately thick beige sputum.  A/P: 1. Acute on chronic respiratory failure secondary to CHF and COPD. Additional 20mg  Lasix given. No SOB after treatments. ABG with elevated PCO2 (as expected in chronic resp failure pt), PO2 low but was drawn on ventimask.  While NP at bedside, pt was weaned down to ventimask at 55% and was maintaining O2 sats in upper 90s and had no increased WOB. Asked RT to wean further if possible to keep sats over 90%. Do not want to use NRB in this man with hypercarbia. If needed, can go to Bipap.  Will follow closely. Is in a SDU bed.  Later, CXR resulted in improved opacity, no pleural effusion, no mention of pulmonary edema.  Jimmye NormanKaren Kirby-Graham, NP [facility] Update: [facility] and pt satting high 90s. No resp distress. Resting. KJKG, NP

## 2015-05-27 NOTE — Progress Notes (Signed)
Pt refused cpap.  Pt was advised that RT is available all night should he change his mind. 

## 2015-05-27 NOTE — Progress Notes (Signed)
ANTIBIOTIC CONSULT NOTE - FOLLOW UP  Pharmacy Consult for Vancomycin -> PO Bactrim Indication: MRSA RUL lung abscess/pneumonia  Allergies  Allergen Reactions  . Lipitor [Atorvastatin Calcium] Other (See Comments)    Muscle aches    Patient Measurements: Height: 5\' 11"  (180.3 cm) Weight: 190 lb 6.4 oz (86.365 kg) IBW/kg (Calculated) : 75.3  Vital Signs: Temp: 98.3 F (36.8 C) (06/07 0400) Temp Source: Oral (06/07 0400) BP: 175/95 mmHg (06/07 1110) Pulse Rate: 94 (06/07 1110) Intake/Output from previous day: 06/06 0701 - 06/07 0700 In: 1460 [P.O.:960; IV Piggyback:500] Out: 1775 [Urine:1775] Intake/Output from this shift: Total I/O In: 250 [IV Piggyback:250] Out: 875 [Urine:875]  Labs:  Recent Labs  05/25/15 0645 05/26/15 0450 05/27/15 0500  WBC 6.3 6.3 6.2  HGB 8.3* 8.7* 8.5*  PLT 214 195 169  CREATININE 0.63 0.48* 0.41*   Estimated Creatinine Clearance: 98 mL/min (by C-G formula based on Cr of 0.41).  Assessment:   Readmitted on 05/23/15 after discharged to SNF on 05/19/15. Had been in the hospital 5/15-5/30/16. Pharmacy assisted with antibiotics during that admission for HCAP and RUL abscess.  MRSA in sputum culture 05/17/15. MIC to Vanc < 0.5.   Repeat blood cultures 6/3 - no growth to date.   Sputum culture sent today    Continues on Vancomycin 1250 mg IV q12hrs.    5/28: Vanc trough = 19 on 1250 mg IV q12h        Trough level not rechecked this admit.     Plan was for Vanc for 1 week from 05/19/15 discharge, then change to PO Bactrim x 10 days. Now to continue PO Bactrim at least until outpatient follow-up with Pulmonary (06/12/15). Per discussed with Dr. Waymon AmatoHongalgi, to [facility].  Goal of Therapy:  Vancomycin trough level 15-20 mcg/ml  Plan:    Vanc to stop after tonight's dose.   Bactrim DS 1 PO BID to begin on 05/28/15.   Follow up sputum culture.    Dennie Fettersgan, Tammera Engert Donovan, [place] Pager: [phone] 05/27/2015,12:03 PM

## 2015-05-27 NOTE — Progress Notes (Signed)
PT Cancellation Note  Patient Details Name: [name] MRN: [id number] DOB: 07/24/1951   Cancelled Treatment:    Reason Eval/Treat Not Completed: Patient declined, no reason specified Pt reports he cannot tolerate therapy at this time. States he has been having increased difficulty with his breathing today. Will continue to follow and work with patient as tolerated.  Berton MountBarbour, Alexius Hangartner S 05/27/2015, 2:01 PM Charlsie MerlesLogan Secor Izabela Ow, [place]PT [phone]

## 2015-05-27 NOTE — Progress Notes (Addendum)
Called to pt bedside for O2 sats 83% on 55% venturi mask.  Abg obtained on venturi mask.  Pt then placed on NRB and o2 sats improved to 100%.  Pt given lasix, is less sob and o2 sats now 100%.  Pt weaned back down slowly to 45% ventimask per Craige CottaKirby, NP.  Per NP, hold off on bipap at this time if pt able to maintain o2 sats on ventimask.  Bipap remains on standby in room per NP request.  RT will continue to monitor pt.

## 2015-05-27 NOTE — Progress Notes (Addendum)
Cardiologist: Previously seen by Dr. Antoine Poche (he states that he was last seen at [facility])  Subjective:   No new complaints. Reassuring cardiac catheterization.  Objective:  Vital Signs in the last 24 hours: Temp:  [98.1 F (36.7 C)-98.3 F (36.8 C)] 98.3 F (36.8 C) (06/07 0400) Pulse Rate:  [79-98] 92 (06/07 1205) Resp:  [13-25] 20 (06/07 1205) BP: (92-178)/(52-95) 126/76 mmHg (06/07 1205) SpO2:  [91 %-100 %] 100 % (06/07 1205) Weight:  [190 lb 6.4 oz (86.365 kg)] 190 lb 6.4 oz (86.365 kg) (06/07 0400)  Intake/Output from previous day: 06/06 0701 - 06/07 0700 In: 1460 [P.O.:960; IV Piggyback:500] Out: 1775 [Urine:1775]   Physical Exam: General: Disheveled, elderly appearing, in no acute distress. Head:  Normocephalic and atraumatic. Facemask oxygen Lungs: Decreased air movement bilaterally. Heart: Normal S1 and S2.  No murmur, rubs or gallops.  Abdomen: soft, non-tender, positive bowel sounds. Overweight Extremities: No clubbing or cyanosis. No edema. Cath site clean dry and intact Skin: Multiple bruises Neurologic: Alert and oriented x 3.    Lab Results:  Recent Labs  05/26/15 0450 05/27/15 0500  WBC 6.3 6.2  HGB 8.7* 8.5*  PLT 195 169    Recent Labs  05/26/15 0450 05/27/15 0500  NA 137 140  K 3.5 3.4*  CL 95* 99*  CO2 35* 34*  GLUCOSE 172* 121*  BUN 8 <5*  CREATININE 0.48* 0.41*    Recent Labs  05/25/15 0100 05/25/15 0645  TROPONINI 0.04* 0.03    Imaging: Dg Chest Port 1 View  05/27/2015   CLINICAL DATA:  Shortness of breath and respiratory distress  EXAM: PORTABLE CHEST - 1 VIEW  COMPARISON:  05/23/2015  FINDINGS: Cardiac shadow is stable. Cavitary lesion is again noted in the right upper lobe stable from the prior study. Previously seen atelectasis has resolved in the interval. No focal infiltrate is seen. No bony abnormality is noted.  IMPRESSION: Stable right upper lobe cavitary lesion.  No other focal abnormality is seen.    Electronically Signed   By: Alcide Clever M.D.   On: 05/27/2015 13:00   Personally viewed.   Telemetry: No adverse arrhythmias Personally viewed.   Echo:  05/24/15  - Left ventricle: The cavity size was mildly dilated. Systolic function was mildly reduced. The estimated ejection fraction was in the range of 45% to 50%. There is hypokinesis of the inferior myocardium. Doppler parameters are consistent with abnormal left ventricular relaxation (grade 1 diastolic dysfunction). - Aortic valve: Trileaflet; mildly thickened, moderately calcified leaflets. Transvalvular velocity was minimally increased. There was no stenosis. There was mild regurgitation. Valve area (Vmax): 2.18 cm^2.  Impressions:  - Compared to the prior study, there has been no significant interval change.  Cardiac Studies:  Cardiac catheterization reassuring with no new PCI.  Scheduled Meds: . sodium chloride   Intravenous Once  . aspirin EC  81 mg Oral Daily  . bisoprolol  5 mg Oral BID  . budesonide (PULMICORT) nebulizer solution  0.25 mg Nebulization QID  . clopidogrel  75 mg Oral Q breakfast  . fentaNYL  12.5 mcg Transdermal Q72H  . fluticasone  2 spray Each Nare BID  . heparin  5,000 Units Subcutaneous 3 times per day  . insulin aspart  0-9 Units Subcutaneous TID WC  . insulin glargine  10 Units Subcutaneous Daily  . ipratropium-albuterol  3 mL Nebulization QID  . lidocaine (PF)      . nystatin  5 mL Oral QID  . pantoprazole  40 mg Oral BID AC  . predniSONE  20 mg Oral Q breakfast  . sodium chloride  3 mL Intravenous Q12H  . sodium chloride  3 mL Intravenous Q12H  . [START ON 05/28/2015] sulfamethoxazole-trimethoprim  1 tablet Oral Q12H  . vancomycin (VANCOCIN) 1250 mg IVPB  1,250 mg Intravenous Q12H   Continuous Infusions: . nitroGLYCERIN 10 mcg/min (05/23/15 2018)   PRN Meds:.sodium chloride, acetaminophen, albuterol, alum & mag hydroxide-simeth, morphine injection, ondansetron **OR**  ondansetron (ZOFRAN) IV, sodium chloride, traMADol   Assessment/Plan:  Principal Problem:   Chest pain Active Problems:   Essential hypertension   Coronary atherosclerosis   Acute on chronic respiratory failure with hypoxia   COPD (chronic obstructive pulmonary disease)   Respiratory distress   OSA (obstructive sleep apnea)   History of CHF (congestive heart failure)   DM (diabetes mellitus) type II uncontrolled, periph vascular disorder   Esophageal reflux   Lung abscess RUL   Sepsis   Chronic respiratory failure with hypercapnia   NSTEMI (non-ST elevated myocardial infarction)   Non-ST elevation myocardial infarction  - Reassuring heart catheterization, nonobstructive disease. Continue with medical management.   - Continue with dual antiplatelets therapy, blocker. I have added statin low-dose. Consider addition of ACE inhibitor if blood pressure able to tolerate.  Statin intolerance  - Has Lipitor listed as an allergy. He is not currently on a statin. With his prior intolerance, I will start pravastatin 20 mg once a day. I understand that this is not a high intensity statin however he may be able to tolerate this.  Severe COPD - - Per primary team. Pulmonary following.  We will go ahead and sign off. Please call us with any questions.   SKAINS, MARK 05/27/2015, 1:12 PM

## 2015-05-28 ENCOUNTER — Encounter: Payer: Self-pay | Admitting: Adult Health

## 2015-05-28 LAB — BASIC METABOLIC PANEL
ANION GAP: 10 (ref 5–15)
BUN: <5 mg/dL — ABNORMAL LOW (ref 6–20)
CALCIUM: 8.4 mg/dL — AB (ref 8.9–10.3)
CHLORIDE: 91 mmol/L — AB (ref 101–111)
CO2: 36 mmol/L — AB (ref 22–32)
Creatinine, Ser: 0.36 mg/dL — ABNORMAL LOW (ref 0.61–1.24)
GFR calc Af Amer: >60 mL/min (ref >60)
GLUCOSE: 96 mg/dL (ref 65–99)
Potassium: 3.3 mmol/L — ABNORMAL LOW (ref 3.5–5.1)
Sodium: 137 mmol/L (ref 135–145)

## 2015-05-28 LAB — GLUCOSE, CAPILLARY
GLUCOSE-CAPILLARY: 98 mg/dL (ref 65–99)
Glucose-Capillary: 105 mg/dL — ABNORMAL HIGH (ref 65–99)
Glucose-Capillary: 176 mg/dL — ABNORMAL HIGH (ref 65–99)

## 2015-05-28 LAB — CBC
HCT: 30.6 % — ABNORMAL LOW (ref 39.0–52.0)
Hemoglobin: 9.4 g/dL — ABNORMAL LOW (ref 13.0–17.0)
MCH: 27.6 pg (ref 26.0–34.0)
MCHC: 30.7 g/dL (ref 30.0–36.0)
MCV: 90 fL (ref 78.0–100.0)
Platelets: 156 10*3/uL (ref 150–400)
RBC: 3.4 MIL/uL — ABNORMAL LOW (ref 4.22–5.81)
RDW: 16.9 % — ABNORMAL HIGH (ref 11.5–15.5)
WBC: 6.1 10*3/uL (ref 4.0–10.5)

## 2015-05-28 MED ORDER — FUROSEMIDE 10 MG/ML IJ SOLN
40.0000 mg | Freq: Once | INTRAMUSCULAR | Status: AC
  Administered 2015-05-28: 40 mg via INTRAVENOUS
  Filled 2015-05-28: qty 4

## 2015-05-28 MED ORDER — FUROSEMIDE 40 MG PO TABS
40.0000 mg | ORAL_TABLET | Freq: Every day | ORAL | Status: DC
  Administered 2015-05-28: 40 mg via ORAL
  Filled 2015-05-28: qty 1

## 2015-05-28 MED ORDER — FUROSEMIDE 10 MG/ML IJ SOLN: 40.0000 mg | Freq: Two times a day (BID) | INTRAMUSCULAR | Status: DC

## 2015-05-28 MED ORDER — POTASSIUM CHLORIDE CRYS ER 20 MEQ PO TBCR
40.0000 meq | EXTENDED_RELEASE_TABLET | Freq: Once | ORAL | Status: AC
  Administered 2015-05-28: 40 meq via ORAL
  Filled 2015-05-28: qty 2

## 2015-05-28 MED ORDER — ALPRAZOLAM 0.5 MG PO TABS
0.5000 mg | ORAL_TABLET | Freq: Two times a day (BID) | ORAL | Status: DC | PRN
  Administered 2015-05-28 – 2015-06-02 (×7): 0.5 mg via ORAL
  Filled 2015-05-28 (×7): qty 1

## 2015-05-28 MED ORDER — FUROSEMIDE 10 MG/ML IJ SOLN
40.0000 mg | Freq: Two times a day (BID) | INTRAMUSCULAR | Status: DC
  Administered 2015-05-29: 40 mg via INTRAVENOUS
  Filled 2015-05-28: qty 4

## 2015-05-28 MED FILL — Lidocaine HCl Local Preservative Free (PF) Inj 1%: INTRAMUSCULAR | Qty: 30 | Status: AC

## 2015-05-28 MED FILL — Heparin Sodium (Porcine) 2 Unit/ML in Sodium Chloride 0.9%: INTRAMUSCULAR | Qty: 1000 | Status: AC

## 2015-05-28 NOTE — Progress Notes (Signed)
Pt refused nocturnal cpap.  Pt was advised that RT is available all night should he change his mind. 

## 2015-05-28 NOTE — Progress Notes (Signed)
Patient ID: [name], male   DOB: 03/15/1951, 64 y.o.   MRN: [id number]   [facility]     Allergies  Allergen Reactions  . Lipitor [Atorvastatin Calcium] Other (See Comments)    Muscle aches       Chief Complaint  Patient presents with  . Hospitalization Follow-up    HPI:  He has been hospitalized for right upper lobe lung abscess; acute on chronic respiratory failure with an exacerbation of his copd; pneumonia and acute on chronic diastolic heart failure. He is here for short term rehab with his goal to return back home. He has issues with chronic pain especially in his back which is not relieved with his current regimen. We did discuss his treatment option.     Past Medical History  Diagnosis Date  . COPD (chronic obstructive pulmonary disease)   . CAD (coronary artery disease)   . Hypertension   . Hyperlipidemia   . Myocardial infarction     "I've had a couple since 2006" (12/26/2013)  . Asthma   . GERD (gastroesophageal reflux disease)   . Stroke     "they say I've had a couple" denies residual on 12/26/2013  . Osteomyelitis hip "when I was young"    "right"  . CHF (congestive heart failure)   . Pneumonia     "have had it several times"  . OSA on CPAP     "wear mask q now and then" (05/24/2015)  . Type II diabetes mellitus   . History of blood transfusion "couple times"    "related to blood thinners"  . Chronic back pain   . Anxiety     Past Surgical History  Procedure Laterality Date  . Appendectomy  1963  . Cardiac catheterization  ? 2009; ?2012  . Coronary angioplasty with stent placement  2006; ?2008; ?2010; 01/2015    "2 + 2 + 1 + 1"  . Cardiac catheterization N/A 05/26/2015    Procedure: Left Heart Cath and Coronary Angiography;  Surgeon: Lennette Biharihomas A Kelly, MD;  [facility];  Service: Cardiovascular;  Laterality: N/A;    VITAL SIGNS BP 138/79 mmHg  Pulse 89  Ht 5\' 11"  (1.803 m)  Wt 189 lb (85.73 kg)  BMI 26.37 kg/m2   No  facility-administered encounter medications on file as of 05/21/2015.   Outpatient Encounter Prescriptions as of 05/21/2015  Medication Sig  . acetaminophen (TYLENOL) 325 MG tablet Take 2 tablets (650 mg total) by mouth every 6 (six) hours as needed for mild pain (or Fever >/= 101).  Marland Kitchen. albuterol (PROVENTIL HFA;VENTOLIN HFA) 108 (90 BASE) MCG/ACT inhaler Inhale 2 puffs into the lungs every 4 (four) hours as needed for wheezing or shortness of breath.  Marland Kitchen. albuterol (PROVENTIL) (2.5 MG/3ML) 0.083% nebulizer solution Take 3 mLs (2.5 mg total) by nebulization every 6 (six) hours as needed for wheezing or shortness of breath. Dx 496  . aspirin 325 MG tablet Take 1 tablet (325 mg total) by mouth every morning.  . benzonatate (TESSALON) 200 MG capsule Take 1 capsule (200 mg total) by mouth 3 (three) times daily. (Patient not taking: Reported on 05/23/2015)  . clopidogrel (PLAVIX) 75 MG tablet Take 1 tablet (75 mg total) by mouth daily.  . fentaNYL (DURAGESIC - DOSED MCG/HR) 12 MCG/HR Place 1 patch (12.5 mcg total) onto the skin every 3 (three) days.  . fluticasone (FLONASE) 50 MCG/ACT nasal spray Place 2 sprays into both nostrils 2 (two) times daily.  . furosemide (  LASIX) 40 MG tablet Take 1 tablet (40 mg total) by mouth daily.  Marland Kitchen guaiFENesin (ROBITUSSIN) 100 MG/5ML SOLN  Take 10 mLs by mouth every 6 (six) hours as needed for cough or to loosen phlegm. )  . insulin glargine (LANTUS) 100 UNIT/ML injection Inject 0.1 mLs (10 Units total) into the skin daily.  Marland Kitchen ipratropium (ATROVENT) 0.02 % nebulizer solution Take 2.5 mLs (0.5 mg total) by nebulization every 4 (four) hours as needed for wheezing or shortness of breath.  . metFORMIN (GLUCOPHAGE) 500 MG tablet Take 1 tablet (500 mg total) by mouth 2 (two) times daily with a meal.  . metoprolol (LOPRESSOR) 50 MG tablet Take 1 tablet (50 mg total) by mouth 2 (two) times daily.  . mometasone-formoterol (DULERA) 100-5 MCG/ACT AERO Inhale 2 puffs into the lungs 2 (two)  times daily.  . nitroGLYCERIN (NITROGLYN) 2 % ointment Apply 0.5 inches topically every 6 (six) hours.  Marland Kitchen nystatin (MYCOSTATIN) 100000 UNIT/ML suspension Take 5 mLs (500,000 Units total) by mouth 4 (four) times daily.  . pantoprazole (PROTONIX) 40 MG tablet  Take 40 mg by mouth 2 (two) times daily.  . potassium chloride SA (K-DUR,KLOR-CON) 20 MEQ tablet Take 1 tablet (20 mEq total) by mouth daily.  . predniSONE (DELTASONE) 10 MG tablet Take 30 mg daily   . traMADol (ULTRAM) 50 MG tablet Take 2 tablets (100 mg total) by mouth every 6 (six) hours as needed for moderate pain.  . vancomycin 1,250 mg in sodium chloride 0.9 % 250 mL Inject 1,250 mg into the vein every 12 (twelve) hours.     SIGNIFICANT DIAGNOSTIC EXAMS  05-04-15: chest x-ray: Unchanged hyperinflation without acute pulmonary process.   05-07-15: 2- d echo: Left ventricle: The cavity size was normal. Wall thickness was normal. Systolic function was normal. The estimated ejection fraction was in the range of 60% to 65%. Wall motion was normal; there were no regional wall motion abnormalities. Doppler parameters are consistent with abnormal left ventricular relaxation (grade 1 diastolic dysfunction). - Aortic valve: There was mild regurgitation. - Pericardium, extracardiac: A small, free-flowing pericardial effusion was identified circumferential to the heart. There was no evidence of hemodynamic compromise.  05-12-15: chest x-ray: 1. Right upper lobe pneumonia, new from 05/04/2015. 2. Emphysema.  05-15-15: chest x-ray: Enlarging right upper lobe pulmonary abscess.   05-15-15: ct of chest: 8.5 cm cavitary lesion with air-fluid level and surrounding inflammatory changes in the medial right upper lobe, new from prior CT, compatible with pulmonary abscess. 9 mm short axis right paratracheal node, likely reactive.  05-15-15: thoracic spine x-ray: 1. Stable multiple thoracic and upper lumbar vertebral compression deformities compared to the CT  earlier today. These were also previously compared to previous radiographs and shown to be stable. 2. Left PICC tip in the inferior aspect of the superior vena cava. If a position at the cavoatrial junction is desired, it would be recommended that this be advanced 2 cm. 3. Stable right upper lobe cavitary lesion.    LABS REVIEWED:   05-07-15: hgb a1c 6.8 05-09-15: wbc 8.4; hgb 7.4; hct 24.6; mcv 91.8; plt 192; glucose 292; bun 19; creat 0.46; k+3.9; na++137; vit b12: 155; folate 5.0 iron 36; tibc 224 05-10-15: wbc 10.1; hgb 8.5; hct 21.8; mcv 93; plt 218; glucose 220; bun 20; creat 0.53; k+4.4; na++139; liver normal albumin 2.2 05-13-15: wbc 5.2; hgb 7.7; hct 25.1; mcv 90.6 ;plt 188; glucose 200; bun 12; creat 0.42; k+3.8; na++136; mag 2.2 05-18-15: wbc 7.4; hgb 8.8; hct  28.2; mcv 87.3 ;plt 188; glucose 88; bun 8; creat 0.44; k+3.9; na++133      Review of Systems  Constitutional: Negative for malaise/fatigue.  Respiratory: Positive for cough and shortness of breath. Negative for wheezing.   Cardiovascular: Negative for chest pain, palpitations and leg swelling.  Gastrointestinal: Negative for heartburn, abdominal pain and constipation.  Musculoskeletal: Positive for myalgias and back pain.  Skin: Negative.   Neurological: Negative for headaches.  Psychiatric/Behavioral: Negative for depression. The patient is not nervous/anxious.      Physical Exam  Constitutional: He is oriented to person, place, and time. No distress.  Overweight   Neck: Neck supple. No JVD present. No thyromegaly present.  Cardiovascular: Normal rate, regular rhythm and intact distal pulses.   Respiratory: Effort normal. No respiratory distress.  Breath sounds  Diminished thoughout with few expiratory wheezes present  GI: Soft. Bowel sounds are normal. He exhibits no distension. There is no tenderness.  Musculoskeletal: He exhibits no edema.  Is able to move all extremities   Neurological: He is alert and  oriented to person, place, and time.  Skin: Skin is warm and dry. He is not diaphoretic.     ASSESSMENT/ PLAN:  1. Diabetes: will continue lantus 10 units daily; metformin 500 mg twice daily will monitor his hgb a1c is 6.8  2. Hypertension: will continue lopressor 50 mg twice daily   3. COPD: will stop the robitussin and will being mucinex dm twice daily will continue tessalon 200 mg three times daily will contiue flonase twice daily; prednisone 30 mg daily  will continue dulera 2 puffs twice daily will continue albuterol inhaler 2 puffs every 4 hours as needed and albuterol neb every 6 hours as needed   4. Diastolic heart failure: is presently stable will continue lasix 40 mg daily with k+ 20 meq daily and will monitor will place him on daily weights   5. Gerd: will continue protonix 40 mg twice daily   6. CAD: no complaints of chest pain present; will continue asa 352 mg daily and plavix 75 mg daily is on ntg past 0.5 inches every 6 hours   7. Thrush: will continue nystatin 5 cc four times daily   8. Chronic pain with back pain present: will increase his duragesic to 25 mcg every 3 days and will continue ultram 100 mg every 6 hours routinely   9. Right upper lobe lung abscess: will continue iv vancomycin for one week post hospitalization; will then begin bactrim twice daily for 10 days; will need to follow up with pulmonology in the next 2 weeks    His goal at this time is for short term rehab and to return back home.    Time spent with patient 50 minutes.    Synthia Innocent NP [facility]  Contact [phone] Monday through Friday 8am- 5pm  After hours call [phone]

## 2015-05-28 NOTE — Progress Notes (Signed)
Paged on call about any further diuretics tonight. Dr placed in notes for aggressive diuresis but none scheduled for tonight

## 2015-05-28 NOTE — Progress Notes (Addendum)
PROGRESS NOTE    [name] XBM:[id number] DOB: 1951-06-11 DOA: 05/23/2015 PCP: Jeanann Lewandowsky, MD  HPI/Brief narrative 64 year old male with history of CAD, COPD on home oxygen 3 L/m, chronic diastolic CHF (EF 02-72 percent by echo 5/18), HTN, DM 2, chronic pain, anemia, NSVT, morbid obesity tobacco abuse, recent hospitalization 05/04/15-05/19/15 related to acute on chronic respiratory failure from COPD exacerbation, aspiration pneumonia and RUL abscess. TCTS consulted-not surgical candidate due to severe COPD and recommended drain placement. Eventually pulmonology recommended discharge on IV vancomycin 1 week followed by Bactrim PO 10 days and outpatient follow-up with pulmonology. Patient readmitted on 05/23/15 for left-sided chest pain.   Assessment/Plan:  NSTEMI - Mildly elevated troponin but with downward trend, 0.11 > 0.08 > 0.06 >then decreased gradually to normal - New EKG changes/anterolateral T-wave inversions on 6/4 compared to the day prior. - Repeat 2-D echo 6/4: LVEF 45-50 percent, hypokinesis of the inferior myocardium and grade 1 diastolic dysfunction and no pericardial effusion. EF has worsened since echo 5/18 - Likely precipitated by severe anemia in the context of underlying CAD - Transfuse to keep hemoglobin >8 g per DL. - Continue aspirin, Plavix and beta blockers. Intolerant to statins. No IV heparin secondary to severe anemia.  Consider addition of ACE inhibitor if blood pressure able to tolerate. - Status post cardiac cath 05/26/15: Results as below. Medical therapy recommended. - Discussed with Dr.Varanasi - As per cardiology: Continue dual antiplatelet therapy, bisoprolol. Low-dose statins added (Lipitor listed as allergy and hence not on statin before-started on pravastatin by cardiology).   Acute on chronic anemia - Hemoglobin lately has been in the 8 g per DL range. - Hb dropped from 8.7 > 6.9 in 12 hours in the absence of overt bleeding or melena.?  Accurate - s/p 1 unit of PRBC 6/4 - Improved and stable with hemoglobin >8 last 3 days.    Mild acute on chronic combined diastolic/new systolic CHF - Lasix posttransfusion and monitor Patient was on Lasix 40 mg a day at home, given respiratory distress last night, weight gain of almost 2 kg since admission, start the patient on aggressive diuresis Follow renal function   Severe COPD (oxygen and steroid dependent) with exacerbation/acute on chronic respiratory failure on home oxygen 3 L/m - Continue oxygen and bronchodilators - Steroids switched to oral by pulmonology (Prednsione 20 mg daily). Complaints IV vancomycin for lung abscess on 05/27/15 and supposed to start oral Bactrim 05/28/15 - Pulmonary follow-up 6/6 appreciated: Recommend continuing DuoNeb QID + when necessary albuterol, continue Pulmicort, Flonase and slow prednisone taper to lowest effective dose. - Patient more dyspneic on 6/7. Chest x-ray without acute findings. Continue current treatment and aggressive diuresis    MRSA RUL lung abscess/pneumonia - As per chest x-ray, decreased cavitatory RUL abscess - Per thoracic surgery: Nothing for them to offer at this time. - Pulmonary consultation appreciated: No role for percutaneous drainage which risks bronchopleural fistula/empyema. Continue IV vancomycin while hospitalized then oral Bactrim until outpatient follow-up with pulmonology/Dr. Delton Coombes - Blood Cultures 2: Negative to date - Patient has completed IV vancomycin 05/27/15, continue PICC line given limited IV access Started oral Bactrim 6/8-continue until outpatient follow-up with Dr. Delton Coombes. SNF to arrange for pulmonary follow-up.   Type II DM  - Continue low-dose Lantus and SSI . Fluctuating and mildly uncontrolled.  GERD  - Continue PPI   Hypokalemia Replete   Chronic pain - Currently controlled  DVT prophylaxis: Subcutaneous heparin Code Status: Full Family Communication: None at bedside  Disposition Plan:  DC to SNF when medically stable-possibly in the next 24-48 hours.    Consultants  Cardiology  Procedures:  2-D echo 05/24/15: Study Conclusions  - Left ventricle: The cavity size was mildly dilated. Systolic function was mildly reduced. The estimated ejection fraction was in the range of 45% to 50%. There is hypokinesis of the inferior myocardium. Doppler parameters are consistent with abnormal left ventricular relaxation (grade 1 diastolic dysfunction). - Aortic valve: Trileaflet; mildly thickened, moderately calcified leaflets. Transvalvular velocity was minimally increased. There was no stenosis. There was mild regurgitation. Valve area (Vmax): 2.18 cm^2.  Impressions:  - Compared to the prior study, there has been no significant interval change.   Cardiac Cath 05/26/15: Conclusion     Ost 1st Mrg to 1st Mrg lesion, 20% stenosed.  1st Mrg lesion, 30% stenosed.  Mid LAD lesion, 30% stenosed.  Low normal LV function with an ejection fraction of 50-55% and mild inferior hypocontractility.  Nonobstructive evidence for coronary calcification and 30% narrowing proximal LAD septal perforating artery before the diagonal vessel; widely patent tandem stents in the circumflex marginal vessel with 30% narrowing beyond the stented segment prior to the OM bifurcation; and widely patent stent in the proximal dominant RCA.  RECOMMENDATION:  Medical therapy.    Antibiotics:  IV vancomycin 05/23/15 >  IV Zosyn 05/23/15 > 05/25/15   Subjective:  Patient had an episode of acute respiratory distress and had to be placed on a nonrebreather last night, stable after receiving IV Lasix  Objective: Filed Vitals:   05/28/15 0424 05/28/15 0501 05/28/15 0800 05/28/15 0809  BP:  150/72    Pulse: 80 82    Temp:  98.3 F (36.8 C) 98.7 F (37.1 C)   TempSrc:  Oral Oral   Resp: 15 17    Height:      Weight:  86 kg (189 lb 9.5 oz)    SpO2: 100% 100%  99%    Intake/Output  Summary (Last 24 hours) at 05/28/15 1052 Last data filed at 05/28/15 1045  Gross per 24 hour  Intake    720 ml  Output   3300 ml  Net  -2580 ml   Filed Weights   05/25/15 0400 05/27/15 0400 05/28/15 0501  Weight: 84.1 kg (185 lb 6.5 oz) 86.365 kg (190 lb 6.4 oz) 86 kg (189 lb 9.5 oz)     Exam:  General exam: Moderately built and nourished middle-aged male sitting propped up in bed with mild increased work of breathing.  Respiratory system: Reduced breath sounds bilaterally with scattered few bilateral medium pitched expiratory rhonchi. Mild increased work of breathing. Cardiovascular system: S1 & S2 heard, RRR. No JVD, murmurs, gallops, clicks. Trace bilateral pitting pedal edema. Telemetry: Sinus rhythm Gastrointestinal system: Abdomen is obese/nondistended, soft and nontender. Normal bowel sounds heard. Central nervous system: Alert and oriented. No focal neurological deficits. Extremities: Symmetric 5 x 5 power.   Data Reviewed: Basic Metabolic Panel:  Recent Labs Lab 05/24/15 0415 05/25/15 0645 05/26/15 0450 05/27/15 0500 05/28/15 0557  NA 139 137 137 140 137  K 3.1* 3.9 3.5 3.4* 3.3*  CL 102 95* 95* 99* 91*  CO2 29 31 35* 34* 36*  GLUCOSE 152* 285* 172* 121* 96  BUN <5* <5*  CREATININE 0.37* 0.63 0.48* 0.41* 0.36*  CALCIUM 7.0* 8.3* 8.4* 8.3* 8.4*   Liver Function Tests:  Recent Labs Lab 05/23/15 1722  AST 24  ALT 21  ALKPHOS 101  BILITOT 0.5  PROT  5.2*  ALBUMIN 2.4*   No results for input(s): LIPASE, AMYLASE in the last 168 hours. No results for input(s): AMMONIA in the last 168 hours. CBC:  Recent Labs Lab 05/23/15 1722 05/24/15 0415 05/24/15 1610 05/25/15 0645 05/26/15 0450 05/27/15 0500 05/28/15 0557  WBC 13.5* 6.9  --  6.3 6.3 6.2 6.1  NEUTROABS 12.9*  --   --  5.4  --   --   --   HGB 8.7* 6.9* 9.1* 8.3* 8.7* 8.5* 9.4*  HCT 26.7* 21.4* 28.6* 25.8* 27.3* 26.8* 30.6*  MCV 86.7 87.3  --  86.6 88.1 89.0 90.0  PLT 124* 178  --  214  195 169 156   Cardiac Enzymes:  Recent Labs Lab 05/24/15 0415 05/24/15 1255 05/24/15 1800 05/25/15 0100 05/25/15 0645  TROPONINI 0.08* 0.06* 0.04* 0.04* 0.03   BNP (last 3 results)  Recent Labs  10/27/14 2311  PROBNP 89.0   CBG:  Recent Labs Lab 05/27/15 0753 05/27/15 1106 05/27/15 1656 05/27/15 2214 05/28/15 0736  GLUCAP 116* 182* 143* 82 105*    Recent Results (from the past 240 hour(s))  Blood Culture (routine x 2)     Status: None (Preliminary result)   Collection Time: 05/23/15  5:57 PM  Result Value Ref Range Status   Specimen Description BLOOD RIGHT ARM  Final   Special Requests BOTTLES DRAWN AEROBIC AND ANAEROBIC 5CC  Final   Culture   Final           BLOOD CULTURE RECEIVED NO GROWTH TO DATE CULTURE WILL BE HELD FOR 5 DAYS BEFORE ISSUING A FINAL NEGATIVE REPORT Performed at [facility]    Report Status PENDING  Incomplete  Blood Culture (routine x 2)     Status: None (Preliminary result)   Collection Time: 05/23/15  6:10 PM  Result Value Ref Range Status   Specimen Description BLOOD RIGHT ARM  Final   Special Requests BOTTLES DRAWN AEROBIC AND ANAEROBIC 5CC  Final   Culture   Final           BLOOD CULTURE RECEIVED NO GROWTH TO DATE CULTURE WILL BE HELD FOR 5 DAYS BEFORE ISSUING A FINAL NEGATIVE REPORT Performed at [facility]    Report Status PENDING  Incomplete  Urine culture     Status: None   Collection Time: 05/23/15  6:55 PM  Result Value Ref Range Status   Specimen Description URINE, RANDOM  Final   Special Requests NONE  Final   Colony Count NO GROWTH Performed at [facility]   Final   Culture NO GROWTH Performed at [facility]   Final   Report Status 05/24/2015 FINAL  Final  Culture, sputum-assessment     Status: None   Collection Time: 05/27/15 12:09 PM  Result Value Ref Range Status   Specimen Description SPU  Final   Special Requests NONE  Final   Sputum evaluation   Final    THIS SPECIMEN IS  ACCEPTABLE. RESPIRATORY CULTURE REPORT TO FOLLOW.   Report Status 05/27/2015 FINAL  Final  Culture, respiratory (NON-Expectorated)     Status: None (Preliminary result)   Collection Time: 05/27/15 12:09 PM  Result Value Ref Range Status   Specimen Description SPUTUM  Final   Special Requests NONE  Final   Gram Stain PENDING  Incomplete   Culture   Final    ABUNDANT STAPHYLOCOCCUS AUREUS Note: RIFAMPIN AND GENTAMICIN SHOULD NOT BE USED AS SINGLE DRUGS FOR TREATMENT OF STAPH INFECTIONS. Performed at  [facility]    Report Status PENDING  Incomplete         Studies: Dg Chest Port 1 View  05/27/2015   CLINICAL DATA:  Chronic respiratory failure with hypercapnia.  EXAM: PORTABLE CHEST - 1 VIEW  COMPARISON:  05/27/2015  FINDINGS: There is reduced opacity in the right upper lobe, with mild residual linear opacities in the area. No confluent airspace consolidation is evident. The pulmonary vasculature is normal. There is no large pleural effusion.  There is a satisfactorily positioned left upper extremity PICC line with tip in the low SVC.  IMPRESSION: Reducing right upper lobe opacity. No acute cardiopulmonary findings.   Electronically Signed   By: Ellery Plunk M.D.   On: 05/27/2015 22:10   Dg Chest Port 1 View  05/27/2015   CLINICAL DATA:  Shortness of breath and respiratory distress  EXAM: PORTABLE CHEST - 1 VIEW  COMPARISON:  05/23/2015  FINDINGS: Cardiac shadow is stable. Cavitary lesion is again noted in the right upper lobe stable from the prior study. Previously seen atelectasis has resolved in the interval. No focal infiltrate is seen. No bony abnormality is noted.  IMPRESSION: Stable right upper lobe cavitary lesion.  No other focal abnormality is seen.   Electronically Signed   By: Alcide Clever M.D.   On: 05/27/2015 13:00        Scheduled Meds: . sodium chloride   Intravenous Once  . antiseptic oral rinse  7 mL Mouth Rinse BID  . aspirin EC  81 mg Oral Daily  .  bisoprolol  5 mg Oral BID  . budesonide (PULMICORT) nebulizer solution  0.25 mg Nebulization QID  . clopidogrel  75 mg Oral Q breakfast  . fentaNYL  12.5 mcg Transdermal Q72H  . fluticasone  2 spray Each Nare BID  . furosemide  40 mg Intravenous Once  . furosemide  40 mg Oral Q breakfast  . heparin  5,000 Units Subcutaneous 3 times per day  . insulin aspart  0-9 Units Subcutaneous TID WC  . insulin glargine  10 Units Subcutaneous Daily  . ipratropium-albuterol  3 mL Nebulization QID  . ipratropium-albuterol  3 mL Nebulization NOW  . nystatin  5 mL Oral QID  . pantoprazole  40 mg Oral BID AC  . pravastatin  20 mg Oral q1800  . predniSONE  20 mg Oral Q breakfast  . sodium chloride  3 mL Intravenous Q12H  . sodium chloride  3 mL Intravenous Q12H  . sulfamethoxazole-trimethoprim  1 tablet Oral Q12H   Continuous Infusions:    Principal Problem:   Chest pain Active Problems:   Essential hypertension   Coronary atherosclerosis   Acute on chronic respiratory failure with hypoxia   COPD (chronic obstructive pulmonary disease)   Respiratory distress   OSA (obstructive sleep apnea)   History of CHF (congestive heart failure)   DM (diabetes mellitus) type II uncontrolled, periph vascular disorder   Esophageal reflux   Lung abscess RUL   Sepsis   Chronic respiratory failure with hypercapnia   NSTEMI (non-ST elevated myocardial infarction)    Time spent: 25 minutes    Richarda Overlie, MD,  [facility] Pager [phone]  If 7PM-7AM, please contact night-coverage [email or web address] Password [email or web address] 05/28/2015, 10:52 AM    LOS: 5 days

## 2015-05-29 LAB — GLUCOSE, CAPILLARY
GLUCOSE-CAPILLARY: 232 mg/dL — AB (ref 65–99)
Glucose-Capillary: 168 mg/dL — ABNORMAL HIGH (ref 65–99)
Glucose-Capillary: 178 mg/dL — ABNORMAL HIGH (ref 65–99)
Glucose-Capillary: 181 mg/dL — ABNORMAL HIGH (ref 65–99)

## 2015-05-29 LAB — CBC
HEMATOCRIT: 27.8 % — AB (ref 39.0–52.0)
Hemoglobin: 8.7 g/dL — ABNORMAL LOW (ref 13.0–17.0)
MCH: 28.2 pg (ref 26.0–34.0)
MCHC: 31.3 g/dL (ref 30.0–36.0)
MCV: 90.3 fL (ref 78.0–100.0)
PLATELETS: 155 10*3/uL (ref 150–400)
RBC: 3.08 MIL/uL — ABNORMAL LOW (ref 4.22–5.81)
RDW: 16.9 % — ABNORMAL HIGH (ref 11.5–15.5)
WBC: 4.6 10*3/uL (ref 4.0–10.5)

## 2015-05-29 LAB — COMPREHENSIVE METABOLIC PANEL
ALK PHOS: 95 U/L (ref 38–126)
ALT: 21 U/L (ref 17–63)
AST: 13 U/L — ABNORMAL LOW (ref 15–41)
Albumin: 2.1 g/dL — ABNORMAL LOW (ref 3.5–5.0)
Anion gap: 7 (ref 5–15)
BILIRUBIN TOTAL: 0.4 mg/dL (ref 0.3–1.2)
BUN: 10 mg/dL (ref 6–20)
CALCIUM: 8.2 mg/dL — AB (ref 8.9–10.3)
CO2: 39 mmol/L — ABNORMAL HIGH (ref 22–32)
Chloride: 93 mmol/L — ABNORMAL LOW (ref 101–111)
Creatinine, Ser: 0.56 mg/dL — ABNORMAL LOW (ref 0.61–1.24)
GFR calc Af Amer: >60 mL/min (ref >60)
GFR calc non Af Amer: >60 mL/min (ref >60)
GLUCOSE: 144 mg/dL — AB (ref 65–99)
Potassium: 3.6 mmol/L (ref 3.5–5.1)
Sodium: 139 mmol/L (ref 135–145)
TOTAL PROTEIN: 4.9 g/dL — AB (ref 6.5–8.1)

## 2015-05-29 LAB — CULTURE, RESPIRATORY

## 2015-05-29 MED ORDER — LEVALBUTEROL HCL 1.25 MG/0.5ML IN NEBU
1.2500 mg | INHALATION_SOLUTION | RESPIRATORY_TRACT | Status: DC | PRN
  Administered 2015-05-30 – 2015-06-03 (×6): 1.25 mg via RESPIRATORY_TRACT
  Filled 2015-05-29 (×7): qty 0.5

## 2015-05-29 MED ORDER — FUROSEMIDE 10 MG/ML IJ SOLN
40.0000 mg | Freq: Three times a day (TID) | INTRAMUSCULAR | Status: DC
  Administered 2015-05-29 – 2015-06-02 (×12): 40 mg via INTRAVENOUS
  Filled 2015-05-29 (×12): qty 4

## 2015-05-29 MED ORDER — IPRATROPIUM BROMIDE 0.02 % IN SOLN
0.5000 mg | Freq: Four times a day (QID) | RESPIRATORY_TRACT | Status: DC
  Administered 2015-05-29 – 2015-06-03 (×22): 0.5 mg via RESPIRATORY_TRACT
  Filled 2015-05-29 (×22): qty 2.5

## 2015-05-29 MED ORDER — LEVALBUTEROL HCL 1.25 MG/0.5ML IN NEBU
1.2500 mg | INHALATION_SOLUTION | Freq: Four times a day (QID) | RESPIRATORY_TRACT | Status: DC
  Administered 2015-05-29 – 2015-06-03 (×22): 1.25 mg via RESPIRATORY_TRACT
  Filled 2015-05-29 (×22): qty 0.5

## 2015-05-29 MED ORDER — METHYLPREDNISOLONE SODIUM SUCC 40 MG IJ SOLR
40.0000 mg | Freq: Two times a day (BID) | INTRAMUSCULAR | Status: DC
  Administered 2015-05-29 – 2015-05-31 (×5): 40 mg via INTRAVENOUS
  Filled 2015-05-29 (×5): qty 1

## 2015-05-29 NOTE — Progress Notes (Signed)
Physical Therapy Treatment Patient Details Name: [name] MRN: [id number] DOB: [birth year] Today's Date: 05/29/2015    History of Present Illness [name]. is a 64 y.o. male with recent hospitalization for R upper lobe abcess with new onset of chest pain and SOB.  History of CAD, O2 dependent COPD, Diastolic CHF, HTN, DM2. Not a surgical candidate    PT Comments    Patient progressing slowly towards goals. Self limiting today, asking very frequently for SpO2 reading (which maintained 100% on 10L supplemental O2). HR stable. Patient was willing to stand, but only once and required mod assist with use of a rolling walker for support. He did tolerate therapeutic exercises well. Spent time discussing the importance of increasing his functional mobility and being out of bed to prevent secondary complications. Patient will continue to benefit from skilled physical therapy services to further improve independence with functional mobility.   Follow Up Recommendations  SNF (pt prefers home vs. new facility search)     Equipment Recommendations   (TBD)    Recommendations for Other Services       Precautions / Restrictions Precautions Precautions: Fall Precaution Comments: go slowly to manage anxiety  Restrictions Weight Bearing Restrictions: No Other Position/Activity Restrictions: prefers to elevate legs for edema control    Mobility  Bed Mobility Overal bed mobility: Needs Assistance Bed Mobility: Supine to Sit;Sit to Supine   Sidelying to sit: Min guard;HOB elevated   Sit to supine: Min assist;HOB elevated Sit to sidelying: Mod assist General bed mobility comments: Close guard for safety. VC for technique. Requires extra time to scoot to edge of bed. Min assist for RLE support back into bed due to weakness.  Transfers Overall transfer level: Needs assistance Equipment used: Rolling walker (2 wheeled) Transfers: Sit to/from Stand Sit to Stand: Mod assist         General transfer comment: Mod assist for boost to stand from lowest bed setting. VC for hand placement and positioning prior to standing. Required 2 attempts.  Ambulation/Gait Ambulation/Gait assistance: Min assist Ambulation Distance (Feet): 2 Feet (side steps ) Assistive device: Rolling walker (2 wheeled) Gait Pattern/deviations:  (Side stepping)     General Gait Details: Willing to take several small side steps towards head of bed. Very anxious. Heavy use of RW for upper extremity support. No overt buckling of knees however appear slightly unstable. SpO2 maintained 100% on 10L supplemental O2.   Stairs            Wheelchair Mobility    Modified Rankin (Stroke Patients Only)       Balance   Sitting-balance support: Single extremity supported Sitting balance-Leahy Scale: Poor Sitting balance - Comments: prefers to prop bil hands; tolerated sitting EOB 5 min x2 focusing on posture and LE exercises   Standing balance support: Bilateral upper extremity supported Standing balance-Leahy Scale: Poor Standing balance comment: Stood x1 minute. Cues for upright posture. Able to march feet x5 and take small side steps. LEs fatigue quickly. pt anxious. unwilling to attempt second stand despite adequate rest break.                    Cognition Arousal/Alertness: Awake/alert Behavior During Therapy: Anxious Overall Cognitive Status: No family/caregiver present to determine baseline cognitive functioning                      Exercises General Exercises - Lower Extremity Ankle Circles/Pumps: AROM;Both;10 reps;Seated Long Arc Quad: Strengthening;Both;10 reps;Seated Heel  Slides: Strengthening;5 reps;Seated Hip Flexion/Marching: Strengthening;Both;5 reps;Standing    General Comments General comments (skin integrity, edema, etc.): Pt self limiting, constantly worried about SpO2 level which maintained 100% throughout therapy session. Unwilling to ambulate or even get  out of bed to chair. Pt states he will only go to chair when has a breathing treatment - RN notified and pt unable to have breathing treatment at this time.      Pertinent Vitals/Pain Pain Assessment: 0-10 Pain Score:  ("I get pain in my feet" no value given) Pain Location: feet Pain Descriptors / Indicators: Pins and needles Pain Intervention(s): Monitored during session;Repositioned    Home Living                      Prior Function            PT Goals (current goals can now be found in the care plan section) Acute Rehab PT Goals Patient Stated Goal: go home PT Goal Formulation: With patient Time For Goal Achievement: 06/08/15 Potential to Achieve Goals: Fair Progress towards PT goals: Progressing toward goals    Frequency  Min 3X/week    PT Plan Current plan remains appropriate    Co-evaluation             End of Session Equipment Utilized During Treatment: Gait belt;Oxygen Activity Tolerance: Patient limited by fatigue;Other (comment) (anxiety) Patient left: with call bell/phone within reach;in bed     Time: 1610-9604 PT Time Calculation (min) (ACUTE ONLY): 21 min  Charges:  $Therapeutic Activity: 8-22 mins                    G Codes:      [name] 06/25/15, 4:01 PM [name] [name], [place] [phone]

## 2015-05-29 NOTE — Progress Notes (Signed)
Respiratory panel growing MRSA called [facility]. MD notified

## 2015-05-29 NOTE — Progress Notes (Addendum)
PROGRESS NOTE    [name] ZOX:[id number] DOB: [birth year] DOA: 05/23/2015 PCP: Jeanann Lewandowsky, MD  HPI/Brief narrative 64 year old male with history of CAD, COPD on home oxygen 3 L/m, chronic diastolic CHF (EF 81-19 percent by echo 5/18), HTN, DM 2, chronic pain, anemia, NSVT, morbid obesity tobacco abuse, recent hospitalization 05/04/15-05/19/15 related to acute on chronic respiratory failure from COPD exacerbation, aspiration pneumonia and RUL abscess. TCTS consulted-not surgical candidate due to severe COPD and recommended drain placement. Eventually pulmonology recommended discharge on IV vancomycin 1 week followed by Bactrim PO 10 days and outpatient follow-up with pulmonology. Patient readmitted on 05/23/15 for left-sided chest pain.   Assessment/Plan:  NSTEMI - Mildly elevated troponin but with downward trend, 0.11 > 0.08 > 0.06 >then decreased gradually to normal - New EKG changes/anterolateral T-wave inversions on 6/4 compared to the day prior. - Repeat 2-D echo 6/4: LVEF 45-50 percent, hypokinesis of the inferior myocardium and grade 1 diastolic dysfunction and no pericardial effusion. EF has worsened since echo 5/18 - Likely precipitated by severe anemia in the context of underlying CAD - Transfuse to keep hemoglobin >8 g per DL. - Continue aspirin, Plavix and beta blockers. Intolerant to statins. No IV heparin secondary to severe anemia.  Consider addition of ACE inhibitor if blood pressure able to tolerate. - Status post cardiac cath 05/26/15: Results as below. Medical therapy recommended. - Discussed with Dr.Varanasi - As per cardiology: Continue dual antiplatelet therapy, bisoprolol. Low-dose statins added (Lipitor listed as allergy and hence not on statin before-started on pravastatin by cardiology).   Acute on chronic anemia - Hemoglobin lately has been in the 8 g per DL range. - Hb dropped from 8.7 > 6.9 in 12 hours in the absence of overt bleeding or melena. - s/p  1 unit of PRBC 6/4 Hemoglobin ranging from 8-9    Mild acute on chronic combined diastolic/new systolic CHF Patient's Lasix has been increased today to 40 mg IV every 8 hours   Severe COPD (oxygen and steroid dependent) with exacerbation/acute on chronic hypoxemic respiratory failure on home oxygen 3 L/m - Continue oxygen and bronchodilators - Steroids switched to oral by pulmonology (Prednsione 20 mg daily). Patient continues to complain of significant chest tightness before switch back to IV Solu-Medrol Completed IV vancomycin for lung abscess on 05/27/15 currently on oral Bactrim 05/28/15 - Pulmonary follow-up 6/6 appreciated: Recommend continuing DuoNeb QID + when necessary albuterol, continue Pulmicort, Flonase and slow prednisone taper to lowest effective dose. Last chest x-ray on 6/7 showed right upper lobe opacity which was found to be reduced in size no other cardiopulmonary findings Change albuterol to Xopenex, out of bed bed to chair, aggressive pulmonary toilet, incentive spirometry     MRSA RUL lung abscess/pneumonia - As per chest x-ray, decreased cavitatory RUL abscess - Per thoracic surgery: Nothing for them to offer at this time. - Pulmonary consultation appreciated: No role for percutaneous drainage which risks bronchopleural fistula/empyema. Continue IV vancomycin while hospitalized then oral Bactrim until outpatient follow-up with pulmonology/Dr. Delton Coombes - Blood Cultures 2: Negative to date - Patient has completed IV vancomycin 05/27/15, continue PICC line given limited IV access Started oral Bactrim 6/8-continue until outpatient follow-up with Dr. Delton Coombes. SNF to arrange for pulmonary follow-up.   Type II DM  - Continue low-dose Lantus and SSI . Fluctuating and mildly uncontrolled.  GERD  - Continue PPI   Hypokalemia Replete   Chronic pain - Currently controlled  DVT prophylaxis: Subcutaneous heparin Code Status: Full Family Communication:  None at  bedside Disposition Plan: DC to SNF when medically stable-possibly in the next 24-48 hours.    Consultants  Cardiology  Procedures:  2-D echo 05/24/15: Study Conclusions  - Left ventricle: The cavity size was mildly dilated. Systolic function was mildly reduced. The estimated ejection fraction was in the range of 45% to 50%. There is hypokinesis of the inferior myocardium. Doppler parameters are consistent with abnormal left ventricular relaxation (grade 1 diastolic dysfunction). - Aortic valve: Trileaflet; mildly thickened, moderately calcified leaflets. Transvalvular velocity was minimally increased. There was no stenosis. There was mild regurgitation. Valve area (Vmax): 2.18 cm^2.  Impressions:  - Compared to the prior study, there has been no significant interval change.   Cardiac Cath 05/26/15: Conclusion     Ost 1st Mrg to 1st Mrg lesion, 20% stenosed.  1st Mrg lesion, 30% stenosed.  Mid LAD lesion, 30% stenosed.  Low normal LV function with an ejection fraction of 50-55% and mild inferior hypocontractility.  Nonobstructive evidence for coronary calcification and 30% narrowing proximal LAD septal perforating artery before the diagonal vessel; widely patent tandem stents in the circumflex marginal vessel with 30% narrowing beyond the stented segment prior to the OM bifurcation; and widely patent stent in the proximal dominant RCA.  RECOMMENDATION:  Medical therapy.    Antibiotics:  IV vancomycin 05/23/15 >  IV Zosyn 05/23/15 > 05/25/15   Subjective: Patient more hypoxic this morning, transition to a Ventimask, requesting frequent nebulizer treatments  Objective: Filed Vitals:   05/29/15 0519 05/29/15 0541 05/29/15 0726 05/29/15 0740  BP:  108/82    Pulse:  85    Temp:    98.4 F (36.9 C)  TempSrc:    Oral  Resp:  16    Height:      Weight:      SpO2: 99% 97% 97%     Intake/Output Summary (Last 24 hours) at 05/29/15 1044 Last data  filed at 05/29/15 2707  Gross per 24 hour  Intake      0 ml  Output   3350 ml  Net  -3350 ml   Filed Weights   05/27/15 0400 05/28/15 0501 05/29/15 0400  Weight: 86.365 kg (190 lb 6.4 oz) 86 kg (189 lb 9.5 oz) 86.3 kg (190 lb 4.1 oz)     Exam:  General exam: Moderately built and nourished middle-aged male sitting propped up in bed with mild increased work of breathing.  Respiratory system: Reduced breath sounds bilaterally with scattered few bilateral medium pitched expiratory rhonchi. Mild increased work of breathing. Cardiovascular system: S1 & S2 heard, RRR. No JVD, murmurs, gallops, clicks. Trace bilateral pitting pedal edema. Telemetry: Sinus rhythm Gastrointestinal system: Abdomen is obese/nondistended, soft and nontender. Normal bowel sounds heard. Central nervous system: Alert and oriented. No focal neurological deficits. Extremities: Symmetric 5 x 5 power.   Data Reviewed: Basic Metabolic Panel:  Recent Labs Lab 05/25/15 0645 05/26/15 0450 05/27/15 0500 05/28/15 0557 05/29/15 0403  NA 137 137 140 137 139  K 3.9 3.5 3.4* 3.3* 3.6  CL 95* 95* 99* 91* 93*  CO2 31 35* 34* 36* 39*  GLUCOSE 285* 172* 121* 96 144*  BUN 8 8 <5* <5* 10  CREATININE 0.63 0.48* 0.41* 0.36* 0.56*  CALCIUM 8.3* 8.4* 8.3* 8.4* 8.2*   Liver Function Tests:  Recent Labs Lab 05/23/15 1722 05/29/15 0403  AST 24 13*  ALT 21 21  ALKPHOS 101 95  BILITOT 0.5 0.4  PROT 5.2* 4.9*  ALBUMIN 2.4* 2.1*  No results for input(s): LIPASE, AMYLASE in the last 168 hours. No results for input(s): AMMONIA in the last 168 hours. CBC:  Recent Labs Lab 05/23/15 1722  05/25/15 0645 05/26/15 0450 05/27/15 0500 05/28/15 0557 05/29/15 0403  WBC 13.5*  < > 6.3 6.3 6.2 6.1 4.6  NEUTROABS 12.9*  --  5.4  --   --   --   --   HGB 8.7*  < > 8.3* 8.7* 8.5* 9.4* 8.7*  HCT 26.7*  < > 25.8* 27.3* 26.8* 30.6* 27.8*  MCV 86.7  < > 86.6 88.1 89.0 90.0 90.3  PLT 124*  < > 214 195 169 156 155  < > = values in  this interval not displayed. Cardiac Enzymes:  Recent Labs Lab 05/24/15 0415 05/24/15 1255 05/24/15 1800 05/25/15 0100 05/25/15 0645  TROPONINI 0.08* 0.06* 0.04* 0.04* 0.03   BNP (last 3 results)  Recent Labs  10/27/14 2311  PROBNP 89.0   CBG:  Recent Labs Lab 05/27/15 2214 05/28/15 0736 05/28/15 1108 05/28/15 2114 05/29/15 0725  GLUCAP 82 105* 176* 98 168*    Recent Results (from the past 240 hour(s))  Blood Culture (routine x 2)     Status: None (Preliminary result)   Collection Time: 05/23/15  5:57 PM  Result Value Ref Range Status   Specimen Description BLOOD RIGHT ARM  Final   Special Requests BOTTLES DRAWN AEROBIC AND ANAEROBIC 5CC  Final   Culture   Final           BLOOD CULTURE RECEIVED NO GROWTH TO DATE CULTURE WILL BE HELD FOR 5 DAYS BEFORE ISSUING A FINAL NEGATIVE REPORT Performed at [facility]    Report Status PENDING  Incomplete  Blood Culture (routine x 2)     Status: None (Preliminary result)   Collection Time: 05/23/15  6:10 PM  Result Value Ref Range Status   Specimen Description BLOOD RIGHT ARM  Final   Special Requests BOTTLES DRAWN AEROBIC AND ANAEROBIC 5CC  Final   Culture   Final           BLOOD CULTURE RECEIVED NO GROWTH TO DATE CULTURE WILL BE HELD FOR 5 DAYS BEFORE ISSUING A FINAL NEGATIVE REPORT Performed at [facility]    Report Status PENDING  Incomplete  Urine culture     Status: None   Collection Time: 05/23/15  6:55 PM  Result Value Ref Range Status   Specimen Description URINE, RANDOM  Final   Special Requests NONE  Final   Colony Count NO GROWTH Performed at [facility]   Final   Culture NO GROWTH Performed at [facility]   Final   Report Status 05/24/2015 FINAL  Final  Culture, sputum-assessment     Status: None   Collection Time: 05/27/15 12:09 PM  Result Value Ref Range Status   Specimen Description SPU  Final   Special Requests NONE  Final   Sputum evaluation   Final    THIS  SPECIMEN IS ACCEPTABLE. RESPIRATORY CULTURE REPORT TO FOLLOW.   Report Status 05/27/2015 FINAL  Final  Culture, respiratory (NON-Expectorated)     Status: None   Collection Time: 05/27/15 12:09 PM  Result Value Ref Range Status   Specimen Description SPUTUM  Final   Special Requests NONE  Final   Gram Stain   Final    FEW WBC PRESENT,BOTH PMN AND MONONUCLEAR NO SQUAMOUS EPITHELIAL CELLS SEEN FEW GRAM POSITIVE COCCI IN PAIRS IN CLUSTERS Performed at [facility]  Culture   Final    ABUNDANT METHICILLIN RESISTANT STAPHYLOCOCCUS AUREUS Note: RIFAMPIN AND GENTAMICIN SHOULD NOT BE USED AS SINGLE DRUGS FOR TREATMENT OF STAPH INFECTIONS. CRITICAL RESULT CALLED TO, READ BACK BY AND VERIFIED WITH: LATAVIA H 6/9  BY REAMM Performed at [facility]    Report Status 05/29/2015 FINAL  Final   Organism ID, Bacteria METHICILLIN RESISTANT STAPHYLOCOCCUS AUREUS  Final      Susceptibility   Methicillin resistant staphylococcus aureus - MIC*    CLINDAMYCIN >=8 RESISTANT Resistant     ERYTHROMYCIN >=8 RESISTANT Resistant     GENTAMICIN <=0.5 SENSITIVE Sensitive     LEVOFLOXACIN >=8 RESISTANT Resistant     OXACILLIN >=4 RESISTANT Resistant     PENICILLIN >=0.5 RESISTANT Resistant     RIFAMPIN <=0.5 SENSITIVE Sensitive     TRIMETH/SULFA <=10 SENSITIVE Sensitive     VANCOMYCIN <=0.5 SENSITIVE Sensitive     TETRACYCLINE <=1 SENSITIVE Sensitive     * ABUNDANT METHICILLIN RESISTANT STAPHYLOCOCCUS AUREUS         Studies: Dg Chest Port 1 View  05/27/2015   CLINICAL DATA:  Chronic respiratory failure with hypercapnia.  EXAM: PORTABLE CHEST - 1 VIEW  COMPARISON:  05/27/2015  FINDINGS: There is reduced opacity in the right upper lobe, with mild residual linear opacities in the area. No confluent airspace consolidation is evident. The pulmonary vasculature is normal. There is no large pleural effusion.  There is a satisfactorily positioned left upper extremity PICC line with tip in the  low SVC.  IMPRESSION: Reducing right upper lobe opacity. No acute cardiopulmonary findings.   Electronically Signed   By: Ellery Plunk M.D.   On: 05/27/2015 22:10   Dg Chest Port 1 View  05/27/2015   CLINICAL DATA:  Shortness of breath and respiratory distress  EXAM: PORTABLE CHEST - 1 VIEW  COMPARISON:  05/23/2015  FINDINGS: Cardiac shadow is stable. Cavitary lesion is again noted in the right upper lobe stable from the prior study. Previously seen atelectasis has resolved in the interval. No focal infiltrate is seen. No bony abnormality is noted.  IMPRESSION: Stable right upper lobe cavitary lesion.  No other focal abnormality is seen.   Electronically Signed   By: Alcide Clever M.D.   On: 05/27/2015 13:00        Scheduled Meds: . sodium chloride   Intravenous Once  . antiseptic oral rinse  7 mL Mouth Rinse BID  . aspirin EC  81 mg Oral Daily  . bisoprolol  5 mg Oral BID  . budesonide (PULMICORT) nebulizer solution  0.25 mg Nebulization QID  . clopidogrel  75 mg Oral Q breakfast  . fentaNYL  12.5 mcg Transdermal Q72H  . fluticasone  2 spray Each Nare BID  . furosemide  40 mg Intravenous Q8H  . heparin  5,000 Units Subcutaneous 3 times per day  . insulin aspart  0-9 Units Subcutaneous TID WC  . insulin glargine  10 Units Subcutaneous Daily  . ipratropium  0.5 mg Nebulization QID  . levalbuterol  1.25 mg Nebulization 4 times per day  . nystatin  5 mL Oral QID  . pantoprazole  40 mg Oral BID AC  . pravastatin  20 mg Oral q1800  . predniSONE  20 mg Oral Q breakfast  . sodium chloride  3 mL Intravenous Q12H  . sodium chloride  3 mL Intravenous Q12H  . sulfamethoxazole-trimethoprim  1 tablet Oral Q12H   Continuous Infusions:    Principal Problem:  Chest pain Active Problems:   Essential hypertension   Coronary atherosclerosis   Acute on chronic respiratory failure with hypoxia   COPD (chronic obstructive pulmonary disease)   Respiratory distress   OSA (obstructive sleep  apnea)   History of CHF (congestive heart failure)   DM (diabetes mellitus) type II uncontrolled, periph vascular disorder   Esophageal reflux   Lung abscess RUL   Sepsis   Chronic respiratory failure with hypercapnia   NSTEMI (non-ST elevated myocardial infarction)    Time spent: 25 minutes    Richarda Overlie, MD,  [facility] Pager [phone]  If 7PM-7AM, please contact night-coverage [email or web address] Password [email or web address] 05/29/2015, 10:44 AM    LOS: 6 days

## 2015-05-29 NOTE — Progress Notes (Signed)
Pt continuing to refuse CPAP. Instructed to notify respiratory if he changes his mind.

## 2015-05-30 LAB — GLUCOSE, CAPILLARY
GLUCOSE-CAPILLARY: 151 mg/dL — AB (ref 65–99)
GLUCOSE-CAPILLARY: 170 mg/dL — AB (ref 65–99)
GLUCOSE-CAPILLARY: 219 mg/dL — AB (ref 65–99)
Glucose-Capillary: 173 mg/dL — ABNORMAL HIGH (ref 65–99)

## 2015-05-30 LAB — CULTURE, BLOOD (ROUTINE X 2)
Culture: NO GROWTH
Culture: NO GROWTH

## 2015-05-30 LAB — COMPREHENSIVE METABOLIC PANEL
ALT: 19 U/L (ref 17–63)
ANION GAP: 11 (ref 5–15)
AST: 13 U/L — ABNORMAL LOW (ref 15–41)
Albumin: 2.1 g/dL — ABNORMAL LOW (ref 3.5–5.0)
Alkaline Phosphatase: 91 U/L (ref 38–126)
BILIRUBIN TOTAL: 0.3 mg/dL (ref 0.3–1.2)
BUN: 9 mg/dL (ref 6–20)
CO2: 37 mmol/L — ABNORMAL HIGH (ref 22–32)
Calcium: 8.6 mg/dL — ABNORMAL LOW (ref 8.9–10.3)
Chloride: 89 mmol/L — ABNORMAL LOW (ref 101–111)
Creatinine, Ser: 0.63 mg/dL (ref 0.61–1.24)
GFR calc Af Amer: >60 mL/min (ref >60)
Glucose, Bld: 234 mg/dL — ABNORMAL HIGH (ref 65–99)
POTASSIUM: 3.9 mmol/L (ref 3.5–5.1)
Sodium: 137 mmol/L (ref 135–145)
TOTAL PROTEIN: 5.1 g/dL — AB (ref 6.5–8.1)

## 2015-05-30 NOTE — Progress Notes (Signed)
Pt states that he doesn't feel that there is anything in his lungs to cough up.  Pt requests CPT be held at this time.

## 2015-05-30 NOTE — Progress Notes (Signed)
OT Cancellation Note  Patient Details Name: [name] MRN: [id number] DOB: 1951/08/31   Cancelled Treatment:    Reason Eval/Treat Not Completed: Patient declined, no reason specified  [facility] Alicen Donalson, OTR/L  [phone] 05/30/2015 05/30/2015, 4:39 PM

## 2015-05-30 NOTE — Progress Notes (Addendum)
PROGRESS NOTE    [name] BBC:[id number] DOB: 09/14/1951 DOA: 05/23/2015 PCP: Jeanann Lewandowsky, MD  HPI/Brief narrative 64 year old male with history of CAD, COPD on home oxygen 3 L/m, chronic diastolic CHF (EF 50-38 percent by echo 5/18), HTN, DM 2, chronic pain, anemia, NSVT, morbid obesity tobacco abuse, recent hospitalization 05/04/15-05/19/15 related to acute on chronic respiratory failure from COPD exacerbation, aspiration pneumonia and RUL abscess. TCTS consulted-not surgical candidate due to severe COPD and recommended drain placement. Eventually pulmonology recommended discharge on IV vancomycin 1 week followed by Bactrim PO 10 days and outpatient follow-up with pulmonology. Patient readmitted on 05/23/15 for left-sided chest pain.   Assessment/Plan:  NSTEMI - Mildly elevated troponin but with downward trend, 0.11 > 0.08 > 0.06 >then decreased gradually to normal - New EKG changes/anterolateral T-wave inversions on 6/4 compared to the day prior. - Repeat 2-D echo 6/4: LVEF 45-50 percent, hypokinesis of the inferior myocardium and grade 1 diastolic dysfunction and no pericardial effusion. EF has worsened since echo 5/18 - Likely precipitated by severe anemia in the context of underlying CAD - Transfuse to keep hemoglobin >8 g per DL. - Continue aspirin, Plavix and beta blockers. Intolerant to statins. No IV heparin secondary to severe anemia.  - Status post cardiac cath 05/26/15: Results as below. Medical therapy recommended. - Discussed with Dr.Varanasi - As per cardiology: Continue dual antiplatelet therapy, bisoprolol. Continue pravastatin   Acute on chronic anemia - Hemoglobin lately has been in the 8 g per DL range. - Hb dropped from 8.7 > 6.9 in 12 hours in the absence of overt bleeding or melena. - s/p 1 unit of PRBC 6/4 Hemoglobin ranging from 8-9    Mild acute on chronic combined diastolic/new systolic CHF Improving, continue Lasix 40 mg IV every 8  hours Continue diuresis, renal function stable   Severe COPD (oxygen and steroid dependent) with exacerbation/acute on chronic hypoxemic respiratory failure on home oxygen 3 L/m - Continue oxygen and bronchodilators - Steroids switched to oral by pulmonology (Prednsione 20 mg daily). Chest tightness seems to have improved after the patient was restarted on IV Solu-Medrol 6/9 Completed IV vancomycin for lung abscess on 05/27/15 currently on oral Bactrim 05/28/15 - Pulmonary follow-up 6/6 appreciated: Recommend continuing DuoNeb QID + when necessary Xopenex, continue Pulmicort, Flonase and slow prednisone taper to lowest effective dose. Last chest x-ray on 6/7 showed right upper lobe opacity which was found to be reduced in size no other cardiopulmonary findings Continue Xopenex, out of bed bed to chair, aggressive pulmonary toilet, incentive spirometry , continues to improve     MRSA RUL lung abscess/pneumonia - As per chest x-ray, decreased cavitatory RUL abscess - Per thoracic surgery: Nothing for them to offer at this time. - Pulmonary consultation appreciated: No role for percutaneous drainage which risks bronchopleural fistula/empyema. Continue IV vancomycin while hospitalized then oral Bactrim until outpatient follow-up with pulmonology/Dr. Delton Coombes - Blood Cultures 2: Negative to date - Patient has completed IV vancomycin 05/27/15, continue PICC line given limited IV access Started oral Bactrim 6/8-continue until outpatient follow-up with Dr. Delton Coombes. SNF to arrange for pulmonary follow-up.   Type II DM  - Continue low-dose Lantus and SSI . Fluctuating and mildly uncontrolled.  GERD  - Continue PPI   Hypokalemia Replete   Chronic pain - Currently controlled  DVT prophylaxis: Subcutaneous heparin Code Status: Full Family Communication: None at bedside Disposition Plan: DC to SNF possibly 6/11 if continues to improve otherwise  Monday    Consultants  Cardiology  Procedures:  2-D echo 05/24/15: Study Conclusions  - Left ventricle: The cavity size was mildly dilated. Systolic function was mildly reduced. The estimated ejection fraction was in the range of 45% to 50%. There is hypokinesis of the inferior myocardium. Doppler parameters are consistent with abnormal left ventricular relaxation (grade 1 diastolic dysfunction). - Aortic valve: Trileaflet; mildly thickened, moderately calcified leaflets. Transvalvular velocity was minimally increased. There was no stenosis. There was mild regurgitation. Valve area (Vmax): 2.18 cm^2.  Impressions:  - Compared to the prior study, there has been no significant interval change.   Cardiac Cath 05/26/15: Conclusion     Ost 1st Mrg to 1st Mrg lesion, 20% stenosed.  1st Mrg lesion, 30% stenosed.  Mid LAD lesion, 30% stenosed.  Low normal LV function with an ejection fraction of 50-55% and mild inferior hypocontractility.  Nonobstructive evidence for coronary calcification and 30% narrowing proximal LAD septal perforating artery before the diagonal vessel; widely patent tandem stents in the circumflex marginal vessel with 30% narrowing beyond the stented segment prior to the OM bifurcation; and widely patent stent in the proximal dominant RCA.  RECOMMENDATION:  Medical therapy.    Antibiotics:  IV vancomycin 05/23/15 >  IV Zosyn 05/23/15 > 05/25/15   Subjective: Patient's hypoxia has been stable, and he actually subjectively feels better, shortness of breath is improving  Objective: Filed Vitals:   05/30/15 0540 05/30/15 0740 05/30/15 1112 05/30/15 1147  BP: 122/75  113/68   Pulse: 72  72   Temp:      TempSrc:      Resp: 15  15   Height:      Weight:      SpO2: 96% 96% 98% 97%    Intake/Output Summary (Last 24 hours) at 05/30/15 1206 Last data filed at 05/30/15 0551  Gross per 24 hour  Intake    120 ml  Output   2550 ml  Net   -2430 ml   Filed Weights   05/27/15 0400 05/28/15 0501 05/29/15 0400  Weight: 86.365 kg (190 lb 6.4 oz) 86 kg (189 lb 9.5 oz) 86.3 kg (190 lb 4.1 oz)     Exam:  General exam: Moderately built and nourished middle-aged male sitting propped up in bed with mild increased work of breathing.  Respiratory system: Reduced breath sounds bilaterally with scattered few bilateral medium pitched expiratory rhonchi. Mild increased work of breathing. Cardiovascular system: S1 & S2 heard, RRR. No JVD, murmurs, gallops, clicks. Trace bilateral pitting pedal edema. Telemetry: Sinus rhythm Gastrointestinal system: Abdomen is obese/nondistended, soft and nontender. Normal bowel sounds heard. Central nervous system: Alert and oriented. No focal neurological deficits. Extremities: Symmetric 5 x 5 power.   Data Reviewed: Basic Metabolic Panel:  Recent Labs Lab 05/26/15 0450 05/27/15 0500 05/28/15 0557 05/29/15 0403 05/30/15 0406  NA 137 140 137 139 137  K 3.5 3.4* 3.3* 3.6 3.9  CL 95* 99* 91* 93* 89*  CO2 35* 34* 36* 39* 37*  GLUCOSE 172* 121* 96 144* 234*  BUN 8 <5* <5* 10 9  CREATININE 0.48* 0.41* 0.36* 0.56* 0.63  CALCIUM 8.4* 8.3* 8.4* 8.2* 8.6*   Liver Function Tests:  Recent Labs Lab 05/23/15 1722 05/29/15 0403 05/30/15 0406  AST 24 13* 13*  ALT ALKPHOS 101 95 91  BILITOT 0.5 0.4 0.3  PROT 5.2* 4.9* 5.1*  ALBUMIN 2.4* 2.1* 2.1*   No results for input(s): LIPASE, AMYLASE in the last 168 hours. No results for input(s): AMMONIA  in the last 168 hours. CBC:  Recent Labs Lab 05/23/15 1722  05/25/15 0645 05/26/15 0450 05/27/15 0500 05/28/15 0557 05/29/15 0403  WBC 13.5*  < > 6.3 6.3 6.2 6.1 4.6  NEUTROABS 12.9*  --  5.4  --   --   --   --   HGB 8.7*  < > 8.3* 8.7* 8.5* 9.4* 8.7*  HCT 26.7*  < > 25.8* 27.3* 26.8* 30.6* 27.8*  MCV 86.7  < > 86.6 88.1 89.0 90.0 90.3  PLT 124*  < > 214 195 169 156 155  < > = values in this interval not displayed. Cardiac  Enzymes:  Recent Labs Lab 05/24/15 0415 05/24/15 1255 05/24/15 1800 05/25/15 0100 05/25/15 0645  TROPONINI 0.08* 0.06* 0.04* 0.04* 0.03   BNP (last 3 results)  Recent Labs  10/27/14 2311  PROBNP 89.0   CBG:  Recent Labs Lab 05/29/15 0725 05/29/15 1108 05/29/15 1622 05/29/15 2122 05/30/15 0818  GLUCAP 168* 232* 178* 181* 151*    Recent Results (from the past 240 hour(s))  Blood Culture (routine x 2)     Status: None   Collection Time: 05/23/15  5:57 PM  Result Value Ref Range Status   Specimen Description BLOOD RIGHT ARM  Final   Special Requests BOTTLES DRAWN AEROBIC AND ANAEROBIC 5CC  Final   Culture   Final    NO GROWTH 5 DAYS Performed at [facility]    Report Status 05/30/2015 FINAL  Final  Blood Culture (routine x 2)     Status: None   Collection Time: 05/23/15  6:10 PM  Result Value Ref Range Status   Specimen Description BLOOD RIGHT ARM  Final   Special Requests BOTTLES DRAWN AEROBIC AND ANAEROBIC 5CC  Final   Culture   Final    NO GROWTH 5 DAYS Performed at [facility]    Report Status 05/30/2015 FINAL  Final  Urine culture     Status: None   Collection Time: 05/23/15  6:55 PM  Result Value Ref Range Status   Specimen Description URINE, RANDOM  Final   Special Requests NONE  Final   Colony Count NO GROWTH Performed at [facility]   Final   Culture NO GROWTH Performed at [facility]   Final   Report Status 05/24/2015 FINAL  Final  Culture, sputum-assessment     Status: None   Collection Time: 05/27/15 12:09 PM  Result Value Ref Range Status   Specimen Description SPU  Final   Special Requests NONE  Final   Sputum evaluation   Final    THIS SPECIMEN IS ACCEPTABLE. RESPIRATORY CULTURE REPORT TO FOLLOW.   Report Status 05/27/2015 FINAL  Final  Culture, respiratory (NON-Expectorated)     Status: None   Collection Time: 05/27/15 12:09 PM  Result Value Ref Range Status   Specimen Description SPUTUM  Final    Special Requests NONE  Final   Gram Stain   Final    FEW WBC PRESENT,BOTH PMN AND MONONUCLEAR NO SQUAMOUS EPITHELIAL CELLS SEEN FEW GRAM POSITIVE COCCI IN PAIRS IN CLUSTERS Performed at [facility]    Culture   Final    ABUNDANT METHICILLIN RESISTANT STAPHYLOCOCCUS AUREUS Note: RIFAMPIN AND GENTAMICIN SHOULD NOT BE USED AS SINGLE DRUGS FOR TREATMENT OF STAPH INFECTIONS. CRITICAL RESULT CALLED TO, READ BACK BY AND VERIFIED WITH: LATAVIA H 6/9 @8  BY REAMM Performed at [facility]    Report Status 05/29/2015 FINAL  Final   Organism  ID, Bacteria METHICILLIN RESISTANT STAPHYLOCOCCUS AUREUS  Final      Susceptibility   Methicillin resistant staphylococcus aureus - MIC*    CLINDAMYCIN >=8 RESISTANT Resistant     ERYTHROMYCIN >=8 RESISTANT Resistant     GENTAMICIN <=0.5 SENSITIVE Sensitive     LEVOFLOXACIN >=8 RESISTANT Resistant     OXACILLIN >=4 RESISTANT Resistant     PENICILLIN >=0.5 RESISTANT Resistant     RIFAMPIN <=0.5 SENSITIVE Sensitive     TRIMETH/SULFA <=10 SENSITIVE Sensitive     VANCOMYCIN <=0.5 SENSITIVE Sensitive     TETRACYCLINE <=1 SENSITIVE Sensitive     * ABUNDANT METHICILLIN RESISTANT STAPHYLOCOCCUS AUREUS         Studies: No results found.      Scheduled Meds: . sodium chloride   Intravenous Once  . antiseptic oral rinse  7 mL Mouth Rinse BID  . aspirin EC  81 mg Oral Daily  . bisoprolol  5 mg Oral BID  . budesonide (PULMICORT) nebulizer solution  0.25 mg Nebulization QID  . clopidogrel  75 mg Oral Q breakfast  . fentaNYL  12.5 mcg Transdermal Q72H  . fluticasone  2 spray Each Nare BID  . furosemide  40 mg Intravenous Q8H  . heparin  5,000 Units Subcutaneous 3 times per day  . insulin aspart  0-9 Units Subcutaneous TID WC  . insulin glargine  10 Units Subcutaneous Daily  . ipratropium  0.5 mg Nebulization QID  . levalbuterol  1.25 mg Nebulization 4 times per day  . methylPREDNISolone (SOLU-MEDROL) injection  40 mg Intravenous  Q12H  . nystatin  5 mL Oral QID  . pantoprazole  40 mg Oral BID AC  . pravastatin  20 mg Oral q1800  . sodium chloride  3 mL Intravenous Q12H  . sodium chloride  3 mL Intravenous Q12H  . sulfamethoxazole-trimethoprim  1 tablet Oral Q12H   Continuous Infusions:    Principal Problem:   Chest pain Active Problems:   Essential hypertension   Coronary atherosclerosis   Acute on chronic respiratory failure with hypoxia   COPD (chronic obstructive pulmonary disease)   Respiratory distress   OSA (obstructive sleep apnea)   History of CHF (congestive heart failure)   DM (diabetes mellitus) type II uncontrolled, periph vascular disorder   Esophageal reflux   Lung abscess RUL   Sepsis   Chronic respiratory failure with hypercapnia   NSTEMI (non-ST elevated myocardial infarction)    Time spent: 25 minutes    Richarda Overlie, MD,  [facility] Pager [phone]  If 7PM-7AM, please contact night-coverage [email or web address] Password [email or web address] 05/30/2015, 12:06 PM    LOS: 7 days

## 2015-05-30 NOTE — Hospital Discharge Follow-Up (Signed)
[facility] Care Coordination Note:  Admit date:  05/23/15 Discharge date: TBD Discharge Disposition: ? Home with sister/? Home health services Patient contact: [phone] Emergency contact(s): Leslie-sister ([phone])  This Case Manager reviewed patient's EMR and determined patient would benefit from post-discharge medical management and chronic care management services through the [facility]. Patient has a history of COPD, HTN, Type 2 DM with PVD, CAD-NSTEMI, MRSA RUL abscess. This Case Manager met with patient to discuss the services and medical management that can be provided at the [facility]. Patient verbalized understanding and agreed to receive post-discharge care at the [facility].   Patient scheduled for Transitional Care appointment on 06/06/15 at 1115 with Dr. Amao.  Clinic information and appointment time provided to patient. Appointment information also placed on AVS.  Assessment:       Home Environment: Patient was at [facility] prior to admission. He has his own private residence but plans to discharge to his sister's (Leslie) home.  Patient indicates his sister will be able to provide 24 hour support at home. Patient's sister lives in a one level private residence-only one step onto porch.       Support System: Family members (sister, daughters)       Level of functioning: Patient independent with daily activities though he indicates he has to give himself a sponge bath because he has difficulty breathing when he stands for a shower.  Patient indicates his sister will cook meals for him.       Home DME: Patient indicates he uses a wheelchair or walker for mobility. He also has a shower chair, grab bars for bathing safety. Patient on home oxygen (3L continuously); he also has a home nebulizer. Supplier is [facility].        Home care services: none prior to admission as patient was at [facility] prior to  admission.  TBD if home health services to be arranged at discharge.       Transportation: Patient indicates his sister or daughters will provide transportation to his appointment on 06/06/15 at 1115.  Patient denies any transportation barriers.         Food/Nutrition: Patient indicates his sister will shop, cook, and prepare his meals. Patient indicates he is able to afford his food.        Medications: Patient indicates he does not have Medicare Part D coverage so he has difficulty affording his medications (especially insulin, Pulmicort). Patient indicates he receives $1652/mo in Social Security. He indicates he plans to try to obtain Medicare Part D as soon as he is able. Does not appear patient meets criteria for Medicare LIS. Discussed pharmacy resources available at [facility].  Spoke with Practice Manager about patient's difficulty affording medications; she indicates if patient discharged on a weekday [facility] will help patient get needed medications.  Spoke with inpatient CM about patient's medication barriers. It was determined patient may need MATCH for medications if discharged over the weekend.        Identified Barriers: lack of pharmacy coverage, need for medication assistance as patient unable to afford medications        PCP: Dr. Jegede at [facility]              Arranged services:        Services communicated to Alesia Shavis, RN CM  

## 2015-05-30 NOTE — Clinical Social Work Note (Signed)
CSW to continue to follow and assist with discharge back to [facility] at time of discharge.  Marcelline Deist, [place] - [phone] Clinical Social Work Department Orthopedics [phone]) and Surgical [phone])

## 2015-05-31 LAB — GLUCOSE, CAPILLARY
GLUCOSE-CAPILLARY: 270 mg/dL — AB (ref 65–99)
GLUCOSE-CAPILLARY: 226 mg/dL — AB (ref 65–99)
GLUCOSE-CAPILLARY: 366 mg/dL — AB (ref 65–99)
GLUCOSE-CAPILLARY: 291 mg/dL — AB (ref 65–99)

## 2015-05-31 LAB — BASIC METABOLIC PANEL
Anion gap: 9 (ref 5–15)
BUN: 18 mg/dL (ref 6–20)
CALCIUM: 8.7 mg/dL — AB (ref 8.9–10.3)
CO2: 37 mmol/L — ABNORMAL HIGH (ref 22–32)
Chloride: 91 mmol/L — ABNORMAL LOW (ref 101–111)
Creatinine, Ser: 0.68 mg/dL (ref 0.61–1.24)
GFR calc non Af Amer: >60 mL/min (ref >60)
Glucose, Bld: 315 mg/dL — ABNORMAL HIGH (ref 65–99)
POTASSIUM: 3.9 mmol/L (ref 3.5–5.1)
SODIUM: 137 mmol/L (ref 135–145)

## 2015-05-31 LAB — CBC
HCT: 27.4 % — ABNORMAL LOW (ref 39.0–52.0)
HEMOGLOBIN: 8.4 g/dL — AB (ref 13.0–17.0)
MCH: 27.3 pg (ref 26.0–34.0)
MCHC: 30.7 g/dL (ref 30.0–36.0)
MCV: 89 fL (ref 78.0–100.0)
PLATELETS: 200 10*3/uL (ref 150–400)
RBC: 3.08 MIL/uL — ABNORMAL LOW (ref 4.22–5.81)
RDW: 16.4 % — AB (ref 11.5–15.5)
WBC: 4.5 10*3/uL (ref 4.0–10.5)

## 2015-05-31 MED ORDER — GUAIFENESIN-DM 100-10 MG/5ML PO SYRP
5.0000 mL | ORAL_SOLUTION | ORAL | Status: DC
  Administered 2015-05-31 – 2015-06-02 (×5): 5 mL via ORAL
  Filled 2015-05-31 (×11): qty 5

## 2015-05-31 MED ORDER — PREDNISONE 20 MG PO TABS
40.0000 mg | ORAL_TABLET | Freq: Every day | ORAL | Status: DC
  Administered 2015-06-01 – 2015-06-03 (×3): 40 mg via ORAL
  Filled 2015-05-31 (×3): qty 2

## 2015-05-31 MED ORDER — ALTEPLASE 2 MG IJ SOLR
2.0000 mg | Freq: Once | INTRAMUSCULAR | Status: AC
  Administered 2015-05-31: 2 mg
  Filled 2015-05-31 (×2): qty 2

## 2015-05-31 MED ORDER — INSULIN ASPART 100 UNIT/ML ~~LOC~~ SOLN
5.0000 [IU] | Freq: Once | SUBCUTANEOUS | Status: AC
  Administered 2015-05-31: 5 [IU] via SUBCUTANEOUS

## 2015-05-31 NOTE — Progress Notes (Signed)
PROGRESS NOTE    [name] ZOX:[id number] DOB: 02-Oct-1951 DOA: 05/23/2015 PCP: Jeanann Lewandowsky, MD  HPI/Brief narrative 64 year old male with history of CAD, COPD on home oxygen 3 L/m, chronic diastolic CHF (EF 81-19 percent by echo 5/18), HTN, DM 2, chronic pain, anemia, NSVT, morbid obesity tobacco abuse, recent hospitalization 05/04/15-05/19/15 related to acute on chronic respiratory failure from COPD exacerbation, aspiration pneumonia and RUL abscess. TCTS consulted-not surgical candidate due to severe COPD and recommended drain placement. Eventually pulmonology recommended discharge on IV vancomycin 1 week followed by Bactrim PO 10 days and outpatient follow-up with pulmonology. Patient readmitted on 05/23/15 for left-sided chest pain.   Assessment/Plan:  NSTEMI - Mildly elevated troponin but with downward trend, 0.11 > 0.08 > 0.06 >then decreased gradually to normal - New EKG changes/anterolateral T-wave inversions on 6/4 compared to the day prior. - Repeat 2-D echo 6/4: LVEF 45-50 percent, hypokinesis of the inferior myocardium and grade 1 diastolic dysfunction and no pericardial effusion. EF has worsened since echo 5/18 - Likely precipitated by severe anemia in the context of underlying CAD - Transfuse to keep hemoglobin >8 g per DL. - Continue aspirin, Plavix and beta blockers. Intolerant to statins. No IV heparin secondary to severe anemia.  - Status post cardiac cath 05/26/15: Results as below. Medical therapy recommended. - Discussed with Dr.Varanasi - As per cardiology: Continue dual antiplatelet therapy, bisoprolol. Continue pravastatin   Acute on chronic anemia - Hemoglobin lately has been in the 8 g per DL range. - Hb dropped from 8.7 > 6.9 in 12 hours in the absence of overt bleeding or melena. - s/p 1 unit of PRBC 6/4 Hemoglobin ranging from 8-9    Mild acute on chronic combined diastolic/new systolic CHF Improving, continue Lasix 40 mg IV every 8  hours Continue diuresis, renal function stable   Severe COPD (oxygen and steroid dependent) with exacerbation/acute on chronic hypoxemic respiratory failure on home oxygen 3 L/m - Continue oxygen and bronchodilators - Steroids switched to oral by pulmonology (Prednsione 20 mg daily). Chest tightness seems to have improved after the patient was restarted on IV Solu-Medrol 6/9, switched to prednisone 40 mg by mouth daily today Completed IV vancomycin for lung abscess on 05/27/15 currently on oral Bactrim  started on  05/28/15, patient to follow-up with Dr. Delton Coombes prior to stopping Bactrim - Pulmonary follow-up 6/6 appreciated: Recommend continuing DuoNeb QID + when necessary Xopenex, continue Pulmicort, Flonase and slow prednisone taper to lowest effective dose. Last chest x-ray on 6/7 showed right upper lobe opacity which was found to be reduced in size no other cardiopulmonary findings Continue Xopenex, out of bed bed to chair, aggressive pulmonary toilet, incentive spirometry , continues to improve     [facility]SA RUL lung abscess/pneumonia - As per chest x-ray, decreased cavitatory RUL abscess - Per thoracic surgery: Nothing for them to offer at this time. - Pulmonary consultation appreciated: No role for percutaneous drainage which risks bronchopleural fistula/empyema. Continue IV vancomycin while hospitalized then oral Bactrim until outpatient follow-up with pulmonology/Dr. Delton Coombes - Blood Cultures 2: Negative to date - Patient has completed IV vancomycin 05/27/15, continue PICC line given limited IV access Started oral Bactrim 6/8-continue until outpatient follow-up with Dr. Delton Coombes. SNF to arrange for pulmonary follow-up.   Type II DM  - Continue low-dose Lantus and SSI . Fluctuating and mildly uncontrolled.  GERD  - Continue PPI   Hypokalemia Replete   Chronic pain - Currently controlled  DVT prophylaxis: Subcutaneous heparin Code Status: Full Family  Communication: None at  bedside Disposition Plan: DC to SNF possibly 6/12    Consultants  Cardiology  Procedures:  2-D echo 05/24/15: Study Conclusions  - Left ventricle: The cavity size was mildly dilated. Systolic function was mildly reduced. The estimated ejection fraction was in the range of 45% to 50%. There is hypokinesis of the inferior myocardium. Doppler parameters are consistent with abnormal left ventricular relaxation (grade 1 diastolic dysfunction). - Aortic valve: Trileaflet; mildly thickened, moderately calcified leaflets. Transvalvular velocity was minimally increased. There was no stenosis. There was mild regurgitation. Valve area (Vmax): 2.18 cm^2.  Impressions:  - Compared to the prior study, there has been no significant interval change.   Cardiac Cath 05/26/15: Conclusion     Ost 1st Mrg to 1st Mrg lesion, 20% stenosed.  1st Mrg lesion, 30% stenosed.  Mid LAD lesion, 30% stenosed.  Low normal LV function with an ejection fraction of 50-55% and mild inferior hypocontractility.  Nonobstructive evidence for coronary calcification and 30% narrowing proximal LAD septal perforating artery before the diagonal vessel; widely patent tandem stents in the circumflex marginal vessel with 30% narrowing beyond the stented segment prior to the OM bifurcation; and widely patent stent in the proximal dominant RCA.  RECOMMENDATION:  Medical therapy.    Antibiotics: Anti-infectives    Start     Dose/Rate Route Frequency Ordered Stop   05/28/15 1000  sulfamethoxazole-trimethoprim (BACTRIM DS,SEPTRA DS) 800-160 MG per tablet 1 tablet     1 tablet Oral Every 12 hours 05/27/15 1202     05/27/15 2000  vancomycin (VANCOCIN) 1,250 mg in sodium chloride 0.9 % 250 mL IVPB     1,250 mg 166.7 mL/hr over 90 Minutes Intravenous Every 12 hours 05/27/15 1201 05/27/15 2204   05/24/15 0800  vancomycin (VANCOCIN) 1,250 mg in sodium chloride 0.9 % 250 mL IVPB  Status:  Discontinued      1,250 mg 166.7 mL/hr over 90 Minutes Intravenous Every 12 hours 05/23/15 2013 05/27/15 1201   05/24/15 0600  piperacillin-tazobactam (ZOSYN) IVPB 3.375 g  Status:  Discontinued     3.375 g 12.5 mL/hr over 240 Minutes Intravenous 3 times per day 05/23/15 2344 05/25/15 1053   05/23/15 2200  piperacillin-tazobactam (ZOSYN) IVPB 3.375 g  Status:  Discontinued     3.375 g 100 mL/hr over 30 Minutes Intravenous 3 times per day 05/23/15 2007 05/23/15 2344   05/23/15 2015  vancomycin (VANCOCIN) IVPB 1000 mg/200 mL premix  Status:  Discontinued     1,000 mg 200 mL/hr over 60 Minutes Intravenous Every 12 hours 05/23/15 2007 05/23/15 2013   05/23/15 1800  vancomycin (VANCOCIN) IVPB 1000 mg/200 mL premix     1,000 mg 200 mL/hr over 60 Minutes Intravenous  Once 05/23/15 1758 05/23/15 2056   05/23/15 1800  piperacillin-tazobactam (ZOSYN) IVPB 3.375 g     3.375 g 100 mL/hr over 30 Minutes Intravenous  Once 05/23/15 1758 05/23/15 1924       Subjective: Patient's hypoxia has been stable, patient continues to feel better every day, still has a productive sputum  Objective: Filed Vitals:   05/31/15 0435 05/31/15 0456 05/31/15 0540 05/31/15 0819  BP: 124/62  116/80   Pulse: 73  116   Temp:      TempSrc:      Resp: 14  16   Height:      Weight:      SpO2: 99% 97% 94% 96%    Intake/Output Summary (Last 24 hours) at 05/31/15 1104 Last data filed  at 05/31/15 0900  Gross per 24 hour  Intake    240 ml  Output   3550 ml  Net  -3310 ml   Filed Weights   05/27/15 0400 05/28/15 0501 05/29/15 0400  Weight: 86.365 kg (190 lb 6.4 oz) 86 kg (189 lb 9.5 oz) 86.3 kg (190 lb 4.1 oz)     Exam:  General exam: Moderately built and nourished middle-aged male sitting propped up in bed with mild increased work of breathing.  Respiratory system: Reduced breath sounds bilaterally with scattered few bilateral medium pitched expiratory rhonchi. Mild increased work of breathing. Cardiovascular system: S1 & S2  heard, RRR. No JVD, murmurs, gallops, clicks. Trace bilateral pitting pedal edema. Telemetry: Sinus rhythm Gastrointestinal system: Abdomen is obese/nondistended, soft and nontender. Normal bowel sounds heard. Central nervous system: Alert and oriented. No focal neurological deficits. Extremities: Symmetric 5 x 5 power.   Data Reviewed: Basic Metabolic Panel:  Recent Labs Lab 05/27/15 0500 05/28/15 0557 05/29/15 0403 05/30/15 0406 05/31/15 0427  NA 140 137 139 137 137  K 3.4* 3.3* 3.6 3.9 3.9  CL 99* 91* 93* 89* 91*  CO2 34* 36* 39* 37* 37*  GLUCOSE 121* 96 144* 234* 315*  BUN <5* <5* 10 9 18   CREATININE 0.41* 0.36* 0.56* 0.63 0.68  CALCIUM 8.3* 8.4* 8.2* 8.6* 8.7*   Liver Function Tests:  Recent Labs Lab 05/29/15 0403 05/30/15 0406  AST 13* 13*  ALT 21 19  ALKPHOS 95 91  BILITOT 0.4 0.3  PROT 4.9* 5.1*  ALBUMIN 2.1* 2.1*   No results for input(s): LIPASE, AMYLASE in the last 168 hours. No results for input(s): AMMONIA in the last 168 hours. CBC:  Recent Labs Lab 05/25/15 0645 05/26/15 0450 05/27/15 0500 05/28/15 0557 05/29/15 0403 05/31/15 0427  WBC 6.3 6.3 6.2 6.1 4.6 4.5  NEUTROABS 5.4  --   --   --   --   --   HGB 8.3* 8.7* 8.5* 9.4* 8.7* 8.4*  HCT 25.8* 27.3* 26.8* 30.6* 27.8* 27.4*  MCV 86.6 88.1 89.0 90.0 90.3 89.0  PLT 214 195 169 156 155 200   Cardiac Enzymes:  Recent Labs Lab 05/24/15 1255 05/24/15 1800 05/25/15 0100 05/25/15 0645  TROPONINI 0.06* 0.04* 0.04* 0.03   BNP (last 3 results)  Recent Labs  10/27/14 2311  PROBNP 89.0   CBG:  Recent Labs Lab 05/30/15 0818 05/30/15 1218 05/30/15 1631 05/30/15 2212 05/31/15 0740  GLUCAP 151* 170* 219* 173* 270*    Recent Results (from the past 240 hour(s))  Blood Culture (routine x 2)     Status: None   Collection Time: 05/23/15  5:57 PM  Result Value Ref Range Status   Specimen Description BLOOD RIGHT ARM  Final   Special Requests BOTTLES DRAWN AEROBIC AND ANAEROBIC 5CC   Final   Culture   Final    NO GROWTH 5 DAYS Performed at [facility]    Report Status 05/30/2015 FINAL  Final  Blood Culture (routine x 2)     Status: None   Collection Time: 05/23/15  6:10 PM  Result Value Ref Range Status   Specimen Description BLOOD RIGHT ARM  Final   Special Requests BOTTLES DRAWN AEROBIC AND ANAEROBIC 5CC  Final   Culture   Final    NO GROWTH 5 DAYS Performed at [facility]    Report Status 05/30/2015 FINAL  Final  Urine culture     Status: None   Collection Time: 05/23/15  6:55  PM  Result Value Ref Range Status   Specimen Description URINE, RANDOM  Final   Special Requests NONE  Final   Colony Count NO GROWTH Performed at [facility]   Final   Culture NO GROWTH Performed at [facility]   Final   Report Status 05/24/2015 FINAL  Final  Culture, sputum-assessment     Status: None   Collection Time: 05/27/15 12:09 PM  Result Value Ref Range Status   Specimen Description SPU  Final   Special Requests NONE  Final   Sputum evaluation   Final    THIS SPECIMEN IS ACCEPTABLE. RESPIRATORY CULTURE REPORT TO FOLLOW.   Report Status 05/27/2015 FINAL  Final  Culture, respiratory (NON-Expectorated)     Status: None   Collection Time: 05/27/15 12:09 PM  Result Value Ref Range Status   Specimen Description SPUTUM  Final   Special Requests NONE  Final   Gram Stain   Final    FEW WBC PRESENT,BOTH PMN AND MONONUCLEAR NO SQUAMOUS EPITHELIAL CELLS SEEN FEW GRAM POSITIVE COCCI IN PAIRS IN CLUSTERS Performed at [facility]    Culture   Final    ABUNDANT METHICILLIN RESISTANT STAPHYLOCOCCUS AUREUS Note: RIFAMPIN AND GENTAMICIN SHOULD NOT BE USED AS SINGLE DRUGS FOR TREATMENT OF STAPH INFECTIONS. CRITICAL RESULT CALLED TO, READ BACK BY AND VERIFIED WITH: LATAVIA H 6/9  BY REAMM Performed at [facility]    Report Status 05/29/2015 FINAL  Final   Organism ID, Bacteria METHICILLIN RESISTANT STAPHYLOCOCCUS AUREUS   Final      Susceptibility   Methicillin resistant staphylococcus aureus - MIC*    CLINDAMYCIN >=8 RESISTANT Resistant     ERYTHROMYCIN >=8 RESISTANT Resistant     GENTAMICIN <=0.5 SENSITIVE Sensitive     LEVOFLOXACIN >=8 RESISTANT Resistant     OXACILLIN >=4 RESISTANT Resistant     PENICILLIN >=0.5 RESISTANT Resistant     RIFAMPIN <=0.5 SENSITIVE Sensitive     TRIMETH/SULFA <=10 SENSITIVE Sensitive     VANCOMYCIN <=0.5 SENSITIVE Sensitive     TETRACYCLINE <=1 SENSITIVE Sensitive     * ABUNDANT METHICILLIN RESISTANT STAPHYLOCOCCUS AUREUS         Studies: No results found.      Scheduled Meds: . sodium chloride   Intravenous Once  . antiseptic oral rinse  7 mL Mouth Rinse BID  . aspirin EC  81 mg Oral Daily  . bisoprolol  5 mg Oral BID  . budesonide (PULMICORT) nebulizer solution  0.25 mg Nebulization QID  . clopidogrel  75 mg Oral Q breakfast  . fentaNYL  12.5 mcg Transdermal Q72H  . fluticasone  2 spray Each Nare BID  . furosemide  40 mg Intravenous Q8H  . guaiFENesin-dextromethorphan  5 mL Oral Q4H  . heparin  5,000 Units Subcutaneous 3 times per day  . insulin aspart  0-9 Units Subcutaneous TID WC  . insulin glargine  10 Units Subcutaneous Daily  . ipratropium  0.5 mg Nebulization QID  . levalbuterol  1.25 mg Nebulization 4 times per day  . methylPREDNISolone (SOLU-MEDROL) injection  40 mg Intravenous Q12H  . nystatin  5 mL Oral QID  . pantoprazole  40 mg Oral BID AC  . pravastatin  20 mg Oral q1800  . sodium chloride  3 mL Intravenous Q12H  . sodium chloride  3 mL Intravenous Q12H  . sulfamethoxazole-trimethoprim  1 tablet Oral Q12H   Continuous Infusions:    Principal Problem:   Chest pain Active Problems:  Essential hypertension   Coronary atherosclerosis   Acute on chronic respiratory failure with hypoxia   COPD (chronic obstructive pulmonary disease)   Respiratory distress   OSA (obstructive sleep apnea)   History of CHF (congestive heart  failure)   DM (diabetes mellitus) type II uncontrolled, periph vascular disorder   Esophageal reflux   Lung abscess RUL   Sepsis   Chronic respiratory failure with hypercapnia   NSTEMI (non-ST elevated myocardial infarction)    Time spent: 25 minutes    Richarda Overlie, MD,  [facility] Pager [phone]  If 7PM-7AM, please contact night-coverage [email or web address] Password [email or web address] 05/31/2015, 11:04 AM    LOS: 8 days

## 2015-05-31 NOTE — Progress Notes (Signed)
Paged on-call about some form of expectorant for coughing up phlegm. Pt has yaunkers running, very thick sputum coming up with exertion and post-Xopenex treatment. Awaiting orders

## 2015-06-01 LAB — GLUCOSE, CAPILLARY
GLUCOSE-CAPILLARY: 162 mg/dL — AB (ref 65–99)
GLUCOSE-CAPILLARY: 264 mg/dL — AB (ref 65–99)
Glucose-Capillary: 295 mg/dL — ABNORMAL HIGH (ref 65–99)

## 2015-06-01 NOTE — Progress Notes (Signed)
Pt refuses CPT at this time. 

## 2015-06-01 NOTE — Progress Notes (Signed)
Pt placed on 45% VM at pt request. Pt stated he was placed on it previously so his SpO2 did not drop. Pt tolerating well at this time. Vitals WNL. No distress.

## 2015-06-01 NOTE — Progress Notes (Signed)
Pt refuses to wear CPAP. No distress noted at this time. 

## 2015-06-01 NOTE — Progress Notes (Signed)
PROGRESS NOTE    [name]:[id number] DOB: 04-16-1951 DOA: 05/23/2015 PCP: Jeanann Lewandowsky, MD  HPI/Brief narrative 64 year old male with history of CAD, COPD on home oxygen 3 L/m, chronic diastolic CHF (EF 81-19 percent by echo 5/18), HTN, DM 2, chronic pain, anemia, NSVT, morbid obesity tobacco abuse, recent hospitalization 05/04/15-05/19/15 related to acute on chronic respiratory failure from COPD exacerbation, aspiration pneumonia and RUL abscess. TCTS consulted-not surgical candidate due to severe COPD and recommended drain placement. Eventually pulmonology recommended discharge on IV vancomycin 1 week followed by Bactrim PO 10 days and outpatient follow-up with pulmonology. Patient readmitted on 05/23/15 for left-sided chest pain.   Assessment/Plan:  NSTEMI - Mildly elevated troponin but with downward trend, 0.11 > 0.08 > 0.06 >then decreased gradually to normal - New EKG changes/anterolateral T-wave inversions on 6/4 compared to the day prior. - Repeat 2-D echo 6/4: LVEF 45-50 percent, hypokinesis of the inferior myocardium and grade 1 diastolic dysfunction and no pericardial effusion. EF has worsened since echo 5/18 - Likely precipitated by severe anemia in the context of underlying CAD - Transfuse to keep hemoglobin >8 g per DL. - Continue aspirin, Plavix and beta blockers. Intolerant to statins. No IV heparin secondary to severe anemia.  - Status post cardiac cath 05/26/15: Results as below. Medical therapy recommended.  As per Dr.Varanasi , cardiology: Continue dual antiplatelet therapy, bisoprolol. Statin    Acute on chronic anemia - Hemoglobin lately has been in the 8 g per DL range. - Hb dropped from 8.7 > 6.9 in 12 hours in the absence of overt bleeding or melena. - s/p 1 unit of PRBC 6/4 Hemoglobin ranging from 8-9     Mild acute on chronic combined diastolic/new systolic CHF Improving, continue Lasix 40 mg IV every 8 hours Continue diuresis, renal function  stable Patient has lost about 7 pounds since 6/3   Severe COPD (oxygen and steroid dependent) with exacerbation/acute on chronic hypoxemic respiratory failure on home oxygen 3 L/m - Continue oxygen and bronchodilators - Steroids switched to oral by pulmonology (Prednsione 20 mg daily). Chest tightness seems to have improved after the patient was restarted on IV Solu-Medrol 6/9, currently on prednisone 40 mg a day Completed IV vancomycin for lung abscess on 05/27/15 currently on oral Bactrim  started on  05/28/15, patient to follow-up with Dr. Delton Coombes prior to stopping Bactrim - Pulmonary follow-up 6/6 appreciated: Recommend continuing DuoNeb QID + when necessary Xopenex, continue Pulmicort, Flonase and slow prednisone taper to lowest effective dose. Last chest x-ray on 6/7 showed right upper lobe opacity which was found to be reduced in size no other cardiopulmonary findings Continue Xopenex, out of   bed to chair, aggressive pulmonary toilet, incentive spirometry , continues to improve encouraged ambulation and out of bed to chair again today   MRSA RUL lung abscess/pneumonia - As per chest x-ray, decreased cavitatory RUL abscess - Per thoracic surgery: Nothing for them to offer at this time. - Pulmonary consultation appreciated: No role for percutaneous drainage which risks bronchopleural fistula/empyema. Continue IV vancomycin while hospitalized then oral Bactrim until outpatient follow-up with pulmonology/Dr. Delton Coombes - Blood Cultures 2: Negative to date - Patient has completed IV vancomycin 05/27/15, continue PICC line given limited IV access Started oral Bactrim 6/8-continue until outpatient follow-up with Dr. Delton Coombes. SNF to arrange for pulmonary follow-up.   Type II DM  - Continue low-dose Lantus and SSI . Fluctuating and mildly uncontrolled.  GERD  - Continue PPI   Hypokalemia Replete  Chronic pain - Currently controlled  DVT prophylaxis: Subcutaneous heparin Code Status:  Full Family Communication: None at bedside Disposition Plan: Patient would like to go home, refusing SNF, would like to be discharged on 6/14   Consultants  Cardiology  Procedures:  2-D echo 05/24/15: Study Conclusions  - Left ventricle: The cavity size was mildly dilated. Systolic function was mildly reduced. The estimated ejection fraction was in the range of 45% to 50%. There is hypokinesis of the inferior myocardium. Doppler parameters are consistent with abnormal left ventricular relaxation (grade 1 diastolic dysfunction). - Aortic valve: Trileaflet; mildly thickened, moderately calcified leaflets. Transvalvular velocity was minimally increased. There was no stenosis. There was mild regurgitation. Valve area (Vmax): 2.18 cm^2.  Impressions:  - Compared to the prior study, there has been no significant interval change.   Cardiac Cath 05/26/15: Conclusion     Ost 1st Mrg to 1st Mrg lesion, 20% stenosed.  1st Mrg lesion, 30% stenosed.  Mid LAD lesion, 30% stenosed.  Low normal LV function with an ejection fraction of 50-55% and mild inferior hypocontractility.  Nonobstructive evidence for coronary calcification and 30% narrowing proximal LAD septal perforating artery before the diagonal vessel; widely patent tandem stents in the circumflex marginal vessel with 30% narrowing beyond the stented segment prior to the OM bifurcation; and widely patent stent in the proximal dominant RCA.  RECOMMENDATION:  Medical therapy.    Antibiotics: Anti-infectives    Start     Dose/Rate Route Frequency Ordered Stop   05/28/15 1000  sulfamethoxazole-trimethoprim (BACTRIM DS,SEPTRA DS) 800-160 MG per tablet 1 tablet     1 tablet Oral Every 12 hours 05/27/15 1202     05/27/15 2000  vancomycin (VANCOCIN) 1,250 mg in sodium chloride 0.9 % 250 mL IVPB     1,250 mg 166.7 mL/hr over 90 Minutes Intravenous Every 12 hours 05/27/15 1201 05/27/15 2204   05/24/15 0800   vancomycin (VANCOCIN) 1,250 mg in sodium chloride 0.9 % 250 mL IVPB  Status:  Discontinued     1,250 mg 166.7 mL/hr over 90 Minutes Intravenous Every 12 hours 05/23/15 2013 05/27/15 1201   05/24/15 0600  piperacillin-tazobactam (ZOSYN) IVPB 3.375 g  Status:  Discontinued     3.375 g 12.5 mL/hr over 240 Minutes Intravenous 3 times per day 05/23/15 2344 05/25/15 1053   05/23/15 2200  piperacillin-tazobactam (ZOSYN) IVPB 3.375 g  Status:  Discontinued     3.375 g 100 mL/hr over 30 Minutes Intravenous 3 times per day 05/23/15 2007 05/23/15 2344   05/23/15 2015  vancomycin (VANCOCIN) IVPB 1000 mg/200 mL premix  Status:  Discontinued     1,000 mg 200 mL/hr over 60 Minutes Intravenous Every 12 hours 05/23/15 2007 05/23/15 2013   05/23/15 1800  vancomycin (VANCOCIN) IVPB 1000 mg/200 mL premix     1,000 mg 200 mL/hr over 60 Minutes Intravenous  Once 05/23/15 1758 05/23/15 2056   05/23/15 1800  piperacillin-tazobactam (ZOSYN) IVPB 3.375 g     3.375 g 100 mL/hr over 30 Minutes Intravenous  Once 05/23/15 1758 05/23/15 1924       Subjective: Patient's hypoxia has been stable, patient continues to feel better every day, still has a productive sputum  Objective: Filed Vitals:   05/31/15 2340 06/01/15 0315 06/01/15 0615 06/01/15 0823  BP: 136/68 122/77 138/75   Pulse: 86 67 74   Temp: 97.9 F (36.6 C)     TempSrc: Oral     Resp: Height:  Weight:  82.237 kg (181 lb 4.8 oz)    SpO2:  99% 98% 99%    Intake/Output Summary (Last 24 hours) at 06/01/15 1050 Last data filed at 06/01/15 0900  Gross per 24 hour  Intake 1599.83 ml  Output   1675 ml  Net -75.17 ml   Filed Weights   05/28/15 0501 05/29/15 0400 06/01/15 0315  Weight: 86 kg (189 lb 9.5 oz) 86.3 kg (190 lb 4.1 oz) 82.237 kg (181 lb 4.8 oz)     Exam:  General exam: Moderately built and nourished middle-aged male sitting propped up in bed with mild increased work of breathing.  Respiratory system: Reduced breath  sounds bilaterally with scattered few bilateral medium pitched expiratory rhonchi. Mild increased work of breathing. Cardiovascular system: S1 & S2 heard, RRR. No JVD, murmurs, gallops, clicks. Trace bilateral pitting pedal edema. Telemetry: Sinus rhythm Gastrointestinal system: Abdomen is obese/nondistended, soft and nontender. Normal bowel sounds heard. Central nervous system: Alert and oriented. No focal neurological deficits. Extremities: Symmetric 5 x 5 power.   Data Reviewed: Basic Metabolic Panel:  Recent Labs Lab 05/27/15 0500 05/28/15 0557 05/29/15 0403 05/30/15 0406 05/31/15 0427  NA 140 137 139 137 137  K 3.4* 3.3* 3.6 3.9 3.9  CL 99* 91* 93* 89* 91*  CO2 34* 36* 39* 37* 37*  GLUCOSE 121* 96 144* 234* 315*  BUN <5* <5* 10 9 18   CREATININE 0.41* 0.36* 0.56* 0.63 0.68  CALCIUM 8.3* 8.4* 8.2* 8.6* 8.7*   Liver Function Tests:  Recent Labs Lab 05/29/15 0403 05/30/15 0406  AST 13* 13*  ALT 21 19  ALKPHOS 95 91  BILITOT 0.4 0.3  PROT 4.9* 5.1*  ALBUMIN 2.1* 2.1*   No results for input(s): LIPASE, AMYLASE in the last 168 hours. No results for input(s): AMMONIA in the last 168 hours. CBC:  Recent Labs Lab 05/26/15 0450 05/27/15 0500 05/28/15 0557 05/29/15 0403 05/31/15 0427  WBC 6.3 6.2 6.1 4.6 4.5  HGB 8.7* 8.5* 9.4* 8.7* 8.4*  HCT 27.3* 26.8* 30.6* 27.8* 27.4*  MCV 88.1 89.0 90.0 90.3 89.0  PLT 195 169 156 155 200   Cardiac Enzymes: No results for input(s): CKTOTAL, CKMB, CKMBINDEX, TROPONINI in the last 168 hours. BNP (last 3 results)  Recent Labs  10/27/14 2311  PROBNP 89.0   CBG:  Recent Labs Lab 05/31/15 0740 05/31/15 1138 05/31/15 1623 05/31/15 2103 06/01/15 0753  GLUCAP 270* 226* 366* 291* 162*    Recent Results (from the past 240 hour(s))  Blood Culture (routine x 2)     Status: None   Collection Time: 05/23/15  5:57 PM  Result Value Ref Range Status   Specimen Description BLOOD RIGHT ARM  Final   Special Requests BOTTLES  DRAWN AEROBIC AND ANAEROBIC 5CC  Final   Culture   Final    NO GROWTH 5 DAYS Performed at [facility]    Report Status 05/30/2015 FINAL  Final  Blood Culture (routine x 2)     Status: None   Collection Time: 05/23/15  6:10 PM  Result Value Ref Range Status   Specimen Description BLOOD RIGHT ARM  Final   Special Requests BOTTLES DRAWN AEROBIC AND ANAEROBIC 5CC  Final   Culture   Final    NO GROWTH 5 DAYS Performed at [facility]    Report Status 05/30/2015 FINAL  Final  Urine culture     Status: None   Collection Time: 05/23/15  6:55 PM  Result Value Ref Range  Status   Specimen Description URINE, RANDOM  Final   Special Requests NONE  Final   Colony Count NO GROWTH Performed at [facility]   Final   Culture NO GROWTH Performed at [facility]   Final   Report Status 05/24/2015 FINAL  Final  Culture, sputum-assessment     Status: None   Collection Time: 05/27/15 12:09 PM  Result Value Ref Range Status   Specimen Description SPU  Final   Special Requests NONE  Final   Sputum evaluation   Final    THIS SPECIMEN IS ACCEPTABLE. RESPIRATORY CULTURE REPORT TO FOLLOW.   Report Status 05/27/2015 FINAL  Final  Culture, respiratory (NON-Expectorated)     Status: None   Collection Time: 05/27/15 12:09 PM  Result Value Ref Range Status   Specimen Description SPUTUM  Final   Special Requests NONE  Final   Gram Stain   Final    FEW WBC PRESENT,BOTH PMN AND MONONUCLEAR NO SQUAMOUS EPITHELIAL CELLS SEEN FEW GRAM POSITIVE COCCI IN PAIRS IN CLUSTERS Performed at [facility]    Culture   Final    ABUNDANT METHICILLIN RESISTANT STAPHYLOCOCCUS AUREUS Note: RIFAMPIN AND GENTAMICIN SHOULD NOT BE USED AS SINGLE DRUGS FOR TREATMENT OF STAPH INFECTIONS. CRITICAL RESULT CALLED TO, READ BACK BY AND VERIFIED WITH: LATAVIA H 6/9  BY REAMM Performed at [facility]    Report Status 05/29/2015 FINAL  Final   Organism ID, Bacteria METHICILLIN  RESISTANT STAPHYLOCOCCUS AUREUS  Final      Susceptibility   Methicillin resistant staphylococcus aureus - MIC*    CLINDAMYCIN >=8 RESISTANT Resistant     ERYTHROMYCIN >=8 RESISTANT Resistant     GENTAMICIN <=0.5 SENSITIVE Sensitive     LEVOFLOXACIN >=8 RESISTANT Resistant     OXACILLIN >=4 RESISTANT Resistant     PENICILLIN >=0.5 RESISTANT Resistant     RIFAMPIN <=0.5 SENSITIVE Sensitive     TRIMETH/SULFA <=10 SENSITIVE Sensitive     VANCOMYCIN <=0.5 SENSITIVE Sensitive     TETRACYCLINE <=1 SENSITIVE Sensitive     * ABUNDANT METHICILLIN RESISTANT STAPHYLOCOCCUS AUREUS         Studies: No results found.      Scheduled Meds: . sodium chloride   Intravenous Once  . antiseptic oral rinse  7 mL Mouth Rinse BID  . aspirin EC  81 mg Oral Daily  . bisoprolol  5 mg Oral BID  . budesonide (PULMICORT) nebulizer solution  0.25 mg Nebulization QID  . clopidogrel  75 mg Oral Q breakfast  . fentaNYL  12.5 mcg Transdermal Q72H  . fluticasone  2 spray Each Nare BID  . furosemide  40 mg Intravenous Q8H  . guaiFENesin-dextromethorphan  5 mL Oral Q4H  . heparin  5,000 Units Subcutaneous 3 times per day  . insulin aspart  0-9 Units Subcutaneous TID WC  . insulin glargine  10 Units Subcutaneous Daily  . ipratropium  0.5 mg Nebulization QID  . levalbuterol  1.25 mg Nebulization 4 times per day  . nystatin  5 mL Oral QID  . pantoprazole  40 mg Oral BID AC  . pravastatin  20 mg Oral q1800  . predniSONE  40 mg Oral Q breakfast  . sodium chloride  3 mL Intravenous Q12H  . sodium chloride  3 mL Intravenous Q12H  . sulfamethoxazole-trimethoprim  1 tablet Oral Q12H   Continuous Infusions:    Principal Problem:   Chest pain Active Problems:   Essential hypertension   Coronary atherosclerosis  Acute on chronic respiratory failure with hypoxia   COPD (chronic obstructive pulmonary disease)   Respiratory distress   OSA (obstructive sleep apnea)   History of CHF (congestive heart  failure)   DM (diabetes mellitus) type II uncontrolled, periph vascular disorder   Esophageal reflux   Lung abscess RUL   Sepsis   Chronic respiratory failure with hypercapnia   NSTEMI (non-ST elevated myocardial infarction)    Time spent: 25 minutes    Richarda Overlie, MD,  [facility] Pager [phone]  If 7PM-7AM, please contact night-coverage [email or web address] Password [email or web address] 06/01/2015, 10:50 AM    LOS: 9 days

## 2015-06-02 LAB — COMPREHENSIVE METABOLIC PANEL
ALK PHOS: 112 U/L (ref 38–126)
ALT: 25 U/L (ref 17–63)
AST: 19 U/L (ref 15–41)
Albumin: 2.7 g/dL — ABNORMAL LOW (ref 3.5–5.0)
Anion gap: 9 (ref 5–15)
BILIRUBIN TOTAL: 0.4 mg/dL (ref 0.3–1.2)
BUN: 16 mg/dL (ref 6–20)
CO2: 38 mmol/L — AB (ref 22–32)
CREATININE: 0.65 mg/dL (ref 0.61–1.24)
Calcium: 9 mg/dL (ref 8.9–10.3)
Chloride: 86 mmol/L — ABNORMAL LOW (ref 101–111)
GFR calc Af Amer: >60 mL/min (ref >60)
Glucose, Bld: 211 mg/dL — ABNORMAL HIGH (ref 65–99)
Potassium: 4.4 mmol/L (ref 3.5–5.1)
Sodium: 133 mmol/L — ABNORMAL LOW (ref 135–145)
Total Protein: 5.5 g/dL — ABNORMAL LOW (ref 6.5–8.1)

## 2015-06-02 LAB — CBC
HEMATOCRIT: 31.3 % — AB (ref 39.0–52.0)
Hemoglobin: 9.9 g/dL — ABNORMAL LOW (ref 13.0–17.0)
MCH: 27.7 pg (ref 26.0–34.0)
MCHC: 31.6 g/dL (ref 30.0–36.0)
MCV: 87.7 fL (ref 78.0–100.0)
PLATELETS: 215 10*3/uL (ref 150–400)
RBC: 3.57 MIL/uL — ABNORMAL LOW (ref 4.22–5.81)
RDW: 16.3 % — AB (ref 11.5–15.5)
WBC: 11.4 10*3/uL — ABNORMAL HIGH (ref 4.0–10.5)

## 2015-06-02 LAB — GLUCOSE, CAPILLARY
GLUCOSE-CAPILLARY: 161 mg/dL — AB (ref 65–99)
GLUCOSE-CAPILLARY: 184 mg/dL — AB (ref 65–99)
GLUCOSE-CAPILLARY: 258 mg/dL — AB (ref 65–99)
Glucose-Capillary: 95 mg/dL (ref 65–99)
Glucose-Capillary: 140 mg/dL — ABNORMAL HIGH (ref 65–99)

## 2015-06-02 MED ORDER — SODIUM CHLORIDE 0.9 % IJ SOLN
10.0000 mL | Freq: Two times a day (BID) | INTRAMUSCULAR | Status: DC
  Administered 2015-06-02: 10 mL

## 2015-06-02 MED ORDER — SODIUM CHLORIDE 0.9 % IJ SOLN: 10.0000 mL | INTRAMUSCULAR | Status: DC | PRN

## 2015-06-02 MED ORDER — FUROSEMIDE 40 MG PO TABS
60.0000 mg | ORAL_TABLET | Freq: Two times a day (BID) | ORAL | Status: DC
  Administered 2015-06-02 – 2015-06-03 (×3): 60 mg via ORAL
  Filled 2015-06-02 (×6): qty 1

## 2015-06-02 MED ORDER — INSULIN GLARGINE 100 UNIT/ML ~~LOC~~ SOLN
10.0000 [IU] | Freq: Every day | SUBCUTANEOUS | Status: DC
  Administered 2015-06-03: 10 [IU] via SUBCUTANEOUS
  Filled 2015-06-02: qty 0.1

## 2015-06-02 MED ORDER — SODIUM CHLORIDE 0.9 % IV SOLN: INTRAVENOUS | Status: DC

## 2015-06-02 MED ORDER — INSULIN GLARGINE 100 UNIT/ML ~~LOC~~ SOLN
12.0000 [IU] | Freq: Every day | SUBCUTANEOUS | Status: DC
  Administered 2015-06-02: 12 [IU] via SUBCUTANEOUS
  Filled 2015-06-02: qty 0.12

## 2015-06-02 NOTE — Progress Notes (Signed)
PT Cancellation Note  Patient Details Name: GARDNER HEBEL MRN: [id number] DOB: May 21, 1951   Cancelled Treatment:    Reason Eval/Treat Not Completed: Patient declined, no reason specified.  Pt indicates he needs a breathing treatment and is not ready for mobility at this time.  Pt states RN is aware about request for breathing treatment.  Will check back another time.     Cozette Braggs, Alison Murray 06/02/2015, 9:25 AM

## 2015-06-02 NOTE — Progress Notes (Signed)
Lasix 60mg  1800 dose rescheduled to 2200 tonight. Afternoon administration too close in time to other.

## 2015-06-02 NOTE — Progress Notes (Signed)
Pt refuses to wear CPAP tonight.

## 2015-06-02 NOTE — Care Management Note (Signed)
Case Management Note  Patient Details  Name: [name] MRN: [id number] Date of Birth: Jul 10, 1951  Subjective/Objective:                    Action/Plan: Home with Home Health  Expected Discharge Date:    06/03/2015              Expected Discharge Plan:  Home w Home Health Services  In-House Referral:  Clinical Social Work  Discharge planning Services  CM Consult  Post Acute Care Choice:  Home Health Choice offered to:  Patient  DME Arranged:    DME Agency:     HH Arranged:    HH Agency:     Status of Service:  Completed, signed off  Medicare Important Message Given:  Yes Date Medicare IM Given:  05/26/15 Medicare IM give by:  Tomi Bamberger, RN, BSN  Date Additional Medicare IM Given:  06/02/15 Additional Medicare Important Message give by:  Isidoro Donning RN   If discussed at Long Length of Stay Meetings, dates discussed:    Additional Comments: Consulted for Home Health. NCM spoke to pt and offered choice for [facility]. Pt states he had [facility] for [facility]. Contacted [facility] and they will not take him back due to unsafe conditions in the home. Pt states [facility] will not accept him. Will attempt to arrange pt with another [facility] agency on tomorrow. Has oxygen, RW, bedside commode and wheelchair at home. Spoke to sister, Olin Hauser [phone]. She is willing for pt to come back home. Pt is refusing SNF.  Elliot Cousin, RN 06/02/2015, 4:58 PM

## 2015-06-02 NOTE — Progress Notes (Addendum)
PROGRESS NOTE    [name] ZOX:[id number] DOB: 1951/02/14 DOA: 05/23/2015 PCP: Jeanann Lewandowsky, MD  HPI/Brief narrative 64 year old male with history of CAD, COPD on home oxygen 3 L/m, chronic diastolic CHF (EF 81-19 percent by echo 5/18), HTN, DM 2, chronic pain, anemia, NSVT, morbid obesity tobacco abuse, recent hospitalization 05/04/15-05/19/15 related to acute on chronic respiratory failure from COPD exacerbation, aspiration pneumonia and RUL abscess. TCTS consulted-not surgical candidate due to severe COPD and recommended drain placement. Eventually pulmonology recommended discharge on IV vancomycin 1 week followed by Bactrim PO 10 days and outpatient follow-up with pulmonology. Patient readmitted on 05/23/15 for left-sided chest pain.   Assessment/Plan:  NSTEMI - Mildly elevated troponin but with downward trend, 0.11 > 0.08 > 0.06 >then decreased gradually to normal - New EKG changes/anterolateral T-wave inversions on 6/4 compared to the day prior. - Repeat 2-D echo 6/4: LVEF 45-50 percent, hypokinesis of the inferior myocardium and grade 1 diastolic dysfunction and no pericardial effusion. EF has worsened since echo 5/18 - Likely precipitated by severe anemia in the context of underlying CAD - Transfuse to keep hemoglobin >8 g per DL. - Continue aspirin, Plavix and beta blockers. Intolerant to statins. No IV heparin secondary to severe anemia.  - Status post cardiac cath 05/26/15: Results as below. Medical therapy recommended.  As per Dr.Varanasi , cardiology: Continue dual antiplatelet therapy, bisoprolol. Statin    Acute on chronic anemia - Hemoglobin lately has been in the 8 g per DL range. - Hb dropped from 8.7 > 6.9 in 12 hours in the absence of overt bleeding or melena. - s/p 1 unit of PRBC 6/4 Hemoglobin ranging from 8-9     Mild acute on chronic combined diastolic/new systolic CHF Switch Lasix to by mouth 60 mg twice a day Continue diuresis, renal function  stable    Severe COPD (oxygen and steroid dependent) with exacerbation/acute on chronic hypoxemic respiratory failure on home oxygen 3 L/m - Continue oxygen and bronchodilators - Steroids switched to oral by pulmonology (Prednsione 20 mg daily). Chest tightness seems to have improved after the patient was restarted on IV Solu-Medrol 6/9, continue prednisone 40 mg a day, and do a slow outpatient taper Completed IV vancomycin for lung abscess on 05/27/15 currently on oral Bactrim  started on  05/28/15, patient to follow-up with Dr. Delton Coombes prior to stopping Bactrim - Pulmonary follow-up 6/6 appreciated: Recommend continuing DuoNeb QID + when necessary Xopenex, continue Pulmicort, Flonase and slow prednisone taper to lowest effective dose. Last chest x-ray on 6/7 showed right upper lobe opacity which was found to be reduced in size no other cardiopulmonary findings Continue Xopenex, out of   bed to chair, aggressive pulmonary toilet, incentive spirometry , continues to improve encouraged ambulation and out of bed to chair again today   MRSA RUL lung abscess/pneumonia - As per chest x-ray, decreased cavitatory RUL abscess - Per thoracic surgery: Nothing for them to offer at this time. - Pulmonary consultation appreciated: No role for percutaneous drainage which risks bronchopleural fistula/empyema. Continue IV vancomycin while hospitalized then oral Bactrim until outpatient follow-up with pulmonology/Dr. Delton Coombes - Blood Cultures 2: Negative to date - Patient has completed IV vancomycin 05/27/15, continue PICC line given limited IV access Started oral Bactrim 6/8-continue until outpatient follow-up with Dr. Delton Coombes. SNF to arrange for pulmonary follow-up.   Type II DM  - Continue low-dose Lantus and SSI . Fluctuating and mildly uncontrolled.  GERD  - Continue PPI   Hypokalemia Replete   Chronic  pain - Currently controlled  DVT prophylaxis: Subcutaneous heparin Code Status: Full Family  Communication: None at bedside Disposition Plan: Patient would like to go home, refusing SNF, would like to be discharged on 6/14   Consultants  Cardiology  Procedures:  2-D echo 05/24/15: Study Conclusions  - Left ventricle: The cavity size was mildly dilated. Systolic function was mildly reduced. The estimated ejection fraction was in the range of 45% to 50%. There is hypokinesis of the inferior myocardium. Doppler parameters are consistent with abnormal left ventricular relaxation (grade 1 diastolic dysfunction). - Aortic valve: Trileaflet; mildly thickened, moderately calcified leaflets. Transvalvular velocity was minimally increased. There was no stenosis. There was mild regurgitation. Valve area (Vmax): 2.18 cm^2.  Impressions:  - Compared to the prior study, there has been no significant interval change.   Cardiac Cath 05/26/15: Conclusion     Ost 1st Mrg to 1st Mrg lesion, 20% stenosed.  1st Mrg lesion, 30% stenosed.  Mid LAD lesion, 30% stenosed.  Low normal LV function with an ejection fraction of 50-55% and mild inferior hypocontractility.  Nonobstructive evidence for coronary calcification and 30% narrowing proximal LAD septal perforating artery before the diagonal vessel; widely patent tandem stents in the circumflex marginal vessel with 30% narrowing beyond the stented segment prior to the OM bifurcation; and widely patent stent in the proximal dominant RCA.  RECOMMENDATION:  Medical therapy.    Antibiotics: Anti-infectives    Start     Dose/Rate Route Frequency Ordered Stop   05/28/15 1000  sulfamethoxazole-trimethoprim (BACTRIM DS,SEPTRA DS) 800-160 MG per tablet 1 tablet     1 tablet Oral Every 12 hours 05/27/15 1202     05/27/15 2000  vancomycin (VANCOCIN) 1,250 mg in sodium chloride 0.9 % 250 mL IVPB     1,250 mg 166.7 mL/hr over 90 Minutes Intravenous Every 12 hours 05/27/15 1201 05/27/15 2204   05/24/15 0800  vancomycin  (VANCOCIN) 1,250 mg in sodium chloride 0.9 % 250 mL IVPB  Status:  Discontinued     1,250 mg 166.7 mL/hr over 90 Minutes Intravenous Every 12 hours 05/23/15 2013 05/27/15 1201   05/24/15 0600  piperacillin-tazobactam (ZOSYN) IVPB 3.375 g  Status:  Discontinued     3.375 g 12.5 mL/hr over 240 Minutes Intravenous 3 times per day 05/23/15 2344 05/25/15 1053   05/23/15 2200  piperacillin-tazobactam (ZOSYN) IVPB 3.375 g  Status:  Discontinued     3.375 g 100 mL/hr over 30 Minutes Intravenous 3 times per day 05/23/15 2007 05/23/15 2344   05/23/15 2015  vancomycin (VANCOCIN) IVPB 1000 mg/200 mL premix  Status:  Discontinued     1,000 mg 200 mL/hr over 60 Minutes Intravenous Every 12 hours 05/23/15 2007 05/23/15 2013   05/23/15 1800  vancomycin (VANCOCIN) IVPB 1000 mg/200 mL premix     1,000 mg 200 mL/hr over 60 Minutes Intravenous  Once 05/23/15 1758 05/23/15 2056   05/23/15 1800  piperacillin-tazobactam (ZOSYN) IVPB 3.375 g     3.375 g 100 mL/hr over 30 Minutes Intravenous  Once 05/23/15 1758 05/23/15 1924       Subjective: Patient's hypoxia has been stable, patient continues to feel better every day, still has a productive sputum  Objective: Filed Vitals:   06/02/15 0034 06/02/15 0400 06/02/15 0637 06/02/15 0834  BP:  132/90    Pulse:  68 70   Temp:  98.2 F (36.8 C)    TempSrc:  Oral    Resp:  11 15   Height:  5\' 11"  (1.803 m)  Weight:  82.781 kg (182 lb 8 oz)    SpO2: 100% 100% 98% 100%    Intake/Output Summary (Last 24 hours) at 06/02/15 1237 Last data filed at 06/02/15 0908  Gross per 24 hour  Intake    720 ml  Output   1476 ml  Net   -756 ml   Filed Weights   05/29/15 0400 06/01/15 0315 06/02/15 0400  Weight: 86.3 kg (190 lb 4.1 oz) 82.237 kg (181 lb 4.8 oz) 82.781 kg (182 lb 8 oz)     Exam:  General exam: Moderately built and nourished middle-aged male sitting propped up in bed with mild increased work of breathing.  Respiratory system: Reduced breath sounds  bilaterally with scattered few bilateral medium pitched expiratory rhonchi. Mild increased work of breathing. Cardiovascular system: S1 & S2 heard, RRR. No JVD, murmurs, gallops, clicks. Trace bilateral pitting pedal edema. Telemetry: Sinus rhythm Gastrointestinal system: Abdomen is obese/nondistended, soft and nontender. Normal bowel sounds heard. Central nervous system: Alert and oriented. No focal neurological deficits. Extremities: Symmetric 5 x 5 power.   Data Reviewed: Basic Metabolic Panel:  Recent Labs Lab 05/27/15 0500 05/28/15 0557 05/29/15 0403 05/30/15 0406 05/31/15 0427  NA 140 137 139 137 137  K 3.4* 3.3* 3.6 3.9 3.9  CL 99* 91* 93* 89* 91*  CO2 34* 36* 39* 37* 37*  GLUCOSE 121* 96 144* 234* 315*  BUN <5* <5* 10 9 18   CREATININE 0.41* 0.36* 0.56* 0.63 0.68  CALCIUM 8.3* 8.4* 8.2* 8.6* 8.7*   Liver Function Tests:  Recent Labs Lab 05/29/15 0403 05/30/15 0406  AST 13* 13*  ALT 21 19  ALKPHOS 95 91  BILITOT 0.4 0.3  PROT 4.9* 5.1*  ALBUMIN 2.1* 2.1*   No results for input(s): LIPASE, AMYLASE in the last 168 hours. No results for input(s): AMMONIA in the last 168 hours. CBC:  Recent Labs Lab 05/27/15 0500 05/28/15 0557 05/29/15 0403 05/31/15 0427  WBC 6.2 6.1 4.6 4.5  HGB 8.5* 9.4* 8.7* 8.4*  HCT 26.8* 30.6* 27.8* 27.4*  MCV 89.0 90.0 90.3 89.0  PLT 169 156 155 200   Cardiac Enzymes: No results for input(s): CKTOTAL, CKMB, CKMBINDEX, TROPONINI in the last 168 hours. BNP (last 3 results)  Recent Labs  10/27/14 2311  PROBNP 89.0   CBG:  Recent Labs Lab 06/01/15 1159 06/01/15 1623 06/02/15 0026 06/02/15 0743 06/02/15 1126  GLUCAP 295* 264* 161* 95 184*    Recent Results (from the past 240 hour(s))  Blood Culture (routine x 2)     Status: None   Collection Time: 05/23/15  5:57 PM  Result Value Ref Range Status   Specimen Description BLOOD RIGHT ARM  Final   Special Requests BOTTLES DRAWN AEROBIC AND ANAEROBIC 5CC  Final   Culture    Final    NO GROWTH 5 DAYS Performed at [facility]    Report Status 05/30/2015 FINAL  Final  Blood Culture (routine x 2)     Status: None   Collection Time: 05/23/15  6:10 PM  Result Value Ref Range Status   Specimen Description BLOOD RIGHT ARM  Final   Special Requests BOTTLES DRAWN AEROBIC AND ANAEROBIC 5CC  Final   Culture   Final    NO GROWTH 5 DAYS Performed at [facility]    Report Status 05/30/2015 FINAL  Final  Urine culture     Status: None   Collection Time: 05/23/15  6:55 PM  Result Value Ref Range Status  Specimen Description URINE, RANDOM  Final   Special Requests NONE  Final   Colony Count NO GROWTH Performed at [facility]   Final   Culture NO GROWTH Performed at [facility]   Final   Report Status 05/24/2015 FINAL  Final  Culture, sputum-assessment     Status: None   Collection Time: 05/27/15 12:09 PM  Result Value Ref Range Status   Specimen Description SPU  Final   Special Requests NONE  Final   Sputum evaluation   Final    THIS SPECIMEN IS ACCEPTABLE. RESPIRATORY CULTURE REPORT TO FOLLOW.   Report Status 05/27/2015 FINAL  Final  Culture, respiratory (NON-Expectorated)     Status: None   Collection Time: 05/27/15 12:09 PM  Result Value Ref Range Status   Specimen Description SPUTUM  Final   Special Requests NONE  Final   Gram Stain   Final    FEW WBC PRESENT,BOTH PMN AND MONONUCLEAR NO SQUAMOUS EPITHELIAL CELLS SEEN FEW GRAM POSITIVE COCCI IN PAIRS IN CLUSTERS Performed at [facility]    Culture   Final    ABUNDANT METHICILLIN RESISTANT STAPHYLOCOCCUS AUREUS Note: RIFAMPIN AND GENTAMICIN SHOULD NOT BE USED AS SINGLE DRUGS FOR TREATMENT OF STAPH INFECTIONS. CRITICAL RESULT CALLED TO, READ BACK BY AND VERIFIED WITH: LATAVIA H 6/9  BY REAMM Performed at [facility]    Report Status 05/29/2015 FINAL  Final   Organism ID, Bacteria METHICILLIN RESISTANT STAPHYLOCOCCUS AUREUS  Final       Susceptibility   Methicillin resistant staphylococcus aureus - MIC*    CLINDAMYCIN >=8 RESISTANT Resistant     ERYTHROMYCIN >=8 RESISTANT Resistant     GENTAMICIN <=0.5 SENSITIVE Sensitive     LEVOFLOXACIN >=8 RESISTANT Resistant     OXACILLIN >=4 RESISTANT Resistant     PENICILLIN >=0.5 RESISTANT Resistant     RIFAMPIN <=0.5 SENSITIVE Sensitive     TRIMETH/SULFA <=10 SENSITIVE Sensitive     VANCOMYCIN <=0.5 SENSITIVE Sensitive     TETRACYCLINE <=1 SENSITIVE Sensitive     * ABUNDANT METHICILLIN RESISTANT STAPHYLOCOCCUS AUREUS         Studies: No results found.      Scheduled Meds: . sodium chloride   Intravenous Once  . antiseptic oral rinse  7 mL Mouth Rinse BID  . aspirin EC  81 mg Oral Daily  . bisoprolol  5 mg Oral BID  . budesonide (PULMICORT) nebulizer solution  0.25 mg Nebulization QID  . clopidogrel  75 mg Oral Q breakfast  . fentaNYL  12.5 mcg Transdermal Q72H  . fluticasone  2 spray Each Nare BID  . furosemide  60 mg Oral BID  . guaiFENesin-dextromethorphan  5 mL Oral Q4H  . heparin  5,000 Units Subcutaneous 3 times per day  . insulin aspart  0-9 Units Subcutaneous TID WC  . [START ON 06/03/2015] insulin glargine  10 Units Subcutaneous Daily  . ipratropium  0.5 mg Nebulization QID  . levalbuterol  1.25 mg Nebulization 4 times per day  . nystatin  5 mL Oral QID  . pantoprazole  40 mg Oral BID AC  . pravastatin  20 mg Oral q1800  . predniSONE  40 mg Oral Q breakfast  . sodium chloride  3 mL Intravenous Q12H  . sodium chloride  3 mL Intravenous Q12H  . sulfamethoxazole-trimethoprim  1 tablet Oral Q12H   Continuous Infusions:    Principal Problem:   Chest pain Active Problems:   Essential hypertension   Coronary atherosclerosis  Acute on chronic respiratory failure with hypoxia   COPD (chronic obstructive pulmonary disease)   Respiratory distress   OSA (obstructive sleep apnea)   History of CHF (congestive heart failure)   DM (diabetes mellitus)  type II uncontrolled, periph vascular disorder   Esophageal reflux   Lung abscess RUL   Sepsis   Chronic respiratory failure with hypercapnia   NSTEMI (non-ST elevated myocardial infarction)    Time spent: 25 minutes    Richarda Overlie, MD,  [facility] Pager [phone]  If 7PM-7AM, please contact night-coverage [email or web address] Password [facility] 06/02/2015, 12:37 PM    LOS: 10 days

## 2015-06-02 NOTE — Clinical Social Work Note (Signed)
Patient currently refusing SNF placement.  Per MD, patient to potentially discharge home if medically stable on 06/03/15. CSW signing off.   Marcelline Deist, [place] - [phone] Clinical Social Work Department Orthopedics [phone]) and Surgical [phone])

## 2015-06-02 NOTE — Hospital Discharge Follow-Up (Signed)
Met with patient to inquire about his discharge plan at this time. He said that he might be going home tomorrow and he will be living with his sister.  He noted that he will have his medications filled at the [facility] and he is aware that he has an appointment at the [facility] on 6/17. CM to continue to follow his hospital course.

## 2015-06-02 NOTE — Progress Notes (Signed)
Inpatient Diabetes Program Recommendations  AACE/ADA: New Consensus Statement on Inpatient Glycemic Control (2013)  Target Ranges:  Prepandial:   less than 140 mg/dL      Peak postprandial:   less than 180 mg/dL (1-2 hours)      Critically ill patients:  140 - 180 mg/dL   Results for [name], [name] (MRN [id number]) as of 06/02/2015 08:31  Ref. Range 06/01/2015 07:53 06/01/2015 11:59 06/01/2015 16:23 06/02/2015 00:26 06/02/2015 07:43  Glucose-Capillary Latest Ref Range: 65-99 mg/dL 389 (H) 373 (H) 428 (H) 161 (H) 95   Reason for Visit: CP/SOB  Diabetes history: DM 2 Outpatient Diabetes medications: Lantus 10 units QHS, Metformin 500 mg BID Current orders for Inpatient glycemic control: Novolog 0-9 units TID, Lantus 12 units QHS (changed this am)  Inpatient Diabetes Program Recommendations Correction (SSI): If patient remains inpatient, please consider increasing Correction scale to Novolog Moderate (0-15 units) TID. Glucose trends increase around meals.  Insulin - Basal: please reduce basal insulin dose back down to 10 units Fasting glucose was 95 this am. Patient may have risk of hypoglycemia in the am.  Thanks,  Christena Deem RN, MSN, [facility] Inpatient Diabetes Coordinator Team Pager [phone]

## 2015-06-03 ENCOUNTER — Telehealth: Payer: Self-pay

## 2015-06-03 LAB — COMPREHENSIVE METABOLIC PANEL
ALK PHOS: 111 U/L (ref 38–126)
ALT: 26 U/L (ref 17–63)
ANION GAP: 10 (ref 5–15)
AST: 17 U/L (ref 15–41)
Albumin: 2.8 g/dL — ABNORMAL LOW (ref 3.5–5.0)
BILIRUBIN TOTAL: 0.3 mg/dL (ref 0.3–1.2)
BUN: 15 mg/dL (ref 6–20)
CO2: 38 mmol/L — ABNORMAL HIGH (ref 22–32)
Calcium: 8.8 mg/dL — ABNORMAL LOW (ref 8.9–10.3)
Chloride: 86 mmol/L — ABNORMAL LOW (ref 101–111)
Creatinine, Ser: 0.65 mg/dL (ref 0.61–1.24)
GFR calc non Af Amer: >60 mL/min (ref >60)
GLUCOSE: 106 mg/dL — AB (ref 65–99)
Potassium: 4.1 mmol/L (ref 3.5–5.1)
Sodium: 134 mmol/L — ABNORMAL LOW (ref 135–145)
Total Protein: 5.3 g/dL — ABNORMAL LOW (ref 6.5–8.1)

## 2015-06-03 LAB — GLUCOSE, CAPILLARY
Glucose-Capillary: 108 mg/dL — ABNORMAL HIGH (ref 65–99)
Glucose-Capillary: 208 mg/dL — ABNORMAL HIGH (ref 65–99)
Glucose-Capillary: 184 mg/dL — ABNORMAL HIGH (ref 65–99)

## 2015-06-03 MED ORDER — BISOPROLOL FUMARATE 5 MG PO TABS: 5.0000 mg | ORAL_TABLET | Freq: Two times a day (BID) | ORAL | Status: DC

## 2015-06-03 MED ORDER — ASPIRIN 81 MG PO TBEC: 81.0000 mg | DELAYED_RELEASE_TABLET | Freq: Every day | ORAL | Status: DC

## 2015-06-03 MED ORDER — SULFAMETHOXAZOLE-TRIMETHOPRIM 800-160 MG PO TABS: 1.0000 | ORAL_TABLET | Freq: Two times a day (BID) | ORAL | Status: DC

## 2015-06-03 MED ORDER — NITROGLYCERIN 2 % TD OINT
0.5000 [in_us] | TOPICAL_OINTMENT | Freq: Four times a day (QID) | TRANSDERMAL | Status: DC
Start: 2015-06-03 — End: 2015-06-03

## 2015-06-03 MED ORDER — FUROSEMIDE 20 MG PO TABS: 60.0000 mg | ORAL_TABLET | Freq: Two times a day (BID) | ORAL | Status: AC

## 2015-06-03 MED ORDER — SULFAMETHOXAZOLE-TRIMETHOPRIM 800-160 MG PO TABS: 1.0000 | ORAL_TABLET | Freq: Two times a day (BID) | ORAL | Status: AC

## 2015-06-03 MED ORDER — ALPRAZOLAM 0.25 MG PO TABS: 0.2500 mg | ORAL_TABLET | Freq: Two times a day (BID) | ORAL | Status: AC | PRN

## 2015-06-03 MED ORDER — FUROSEMIDE 20 MG PO TABS: 60.0000 mg | ORAL_TABLET | Freq: Two times a day (BID) | ORAL | Status: DC

## 2015-06-03 MED ORDER — NITROGLYCERIN 2 % TD OINT: 0.5000 [in_us] | TOPICAL_OINTMENT | Freq: Four times a day (QID) | TRANSDERMAL | Status: DC

## 2015-06-03 MED ORDER — PRAVASTATIN SODIUM 20 MG PO TABS: 20.0000 mg | ORAL_TABLET | Freq: Every day | ORAL | Status: DC

## 2015-06-03 MED ORDER — BISOPROLOL FUMARATE 5 MG PO TABS: 5.0000 mg | ORAL_TABLET | Freq: Two times a day (BID) | ORAL | Status: AC

## 2015-06-03 NOTE — Progress Notes (Addendum)
PT Cancellation Note  Patient Details Name: [name] MRN: [id number] DOB: 06/17/1951   Cancelled Treatment:    Reason Eval/Treat Not Completed: Patient not medically ready. RN in room having just pulled PICC line and stated he needs to be in bed/bedrest for another 30 minutes.  Noted pt now for discharge to his sister's home today. Pt has at most walked 2 feet with PT since admission and requires mod assist to stand. Will discuss with RN.   Addendum-(1422) Spoke with Ethelene Browns, RN and pt has been a difficult pivot to [facility] with nursing. He questioned pt re: transport home via ambulance this morning (based on pt's performance, would agree ambulance is indicated for safety), and pt has refused ambulance. Patient plans for grandson to pick him up and assist with car transfers. Discussed with RN the possibility of using [employer] device for transfer into the patient's car.  Keionna Kinnaird 06/03/2015, 2:13 PM Pager [phone]

## 2015-06-03 NOTE — Care Management Note (Signed)
Case Management Note  Patient Details  Name: [name] MRN: [id number] Date of Birth: 03-04-1951  Subjective/Objective:     Plan for d/c today with [facility]. Pt to get medications via the [facility] and [facility]. Family at bedside and aware.                Action/Plan: CM to fax orders to [facility]. No further needs at this time.    Expected Discharge Date:                  Expected Discharge Plan:  Home w Home Health Services  In-House Referral:  Clinical Social Work  Discharge planning Services  CM Consult  Post Acute Care Choice:  Home Health Choice offered to:  Patient  DME Arranged:    DME Agency:     HH Arranged:  RN, PT, OT HH Agency:  [facility]  Status of Service:  Completed, signed off  Medicare Important Message Given:  Yes Date Medicare IM Given:  05/26/15 Medicare IM give by:  Tomi Bamberger, RN, BSN  Date Additional Medicare IM Given:  06/02/15 Additional Medicare Important Message give by:  Isidoro Donning RN   If discussed at Long Length of Stay Meetings, dates discussed:    Additional Comments:  Gala Lewandowsky, RN 06/03/2015, 3:54 PM

## 2015-06-03 NOTE — Care Management Note (Addendum)
Case Management Note  Patient Details  Name: [name] MRN: [id number] Date of Birth: 1950-12-30  Subjective/Objective:       CM did speak with pt in regards to [facility] services. Pt states he will be going to his sister's home Olin Hauser Address [address] [place]. [place] [address]. DME is at his sisters home.              Action/Plan: CM did call several home health agencies to see if they would be able to provide services. CM is awaiting a call back from [facility] and [facility]. Per Genevieve Norlander unable to take pt back due to safety issues in his home. CM called for clarification due to pt will be going to sisters home. CM will continue to monitor.   06-03-15  1235 CM did receive call back from [facility] and they are declining patient due to Hx of smoking with 02 and noncompliance with medications. CM did place call to [facility] to see if is willing to provide services. Awaiting call back.    Expected Discharge Date:                  Expected Discharge Plan:  Home w Home Health Services  In-House Referral:  Clinical Social Work  Discharge planning Services  CM Consult  Post Acute Care Choice:  Home Health Choice offered to:  Patient  DME Arranged:    DME Agency:     HH Arranged:    HH Agency:     Status of Service:  Completed, signed off  Medicare Important Message Given:  Yes Date Medicare IM Given:  05/26/15 Medicare IM give by:  Tomi Bamberger, RN, BSN  Date Additional Medicare IM Given:  06/02/15 Additional Medicare Important Message give by:  Isidoro Donning RN   If discussed at Long Length of Stay Meetings, dates discussed:    Additional Comments:  Gala Lewandowsky, RN 06/03/2015, 12:13 PM

## 2015-06-03 NOTE — Telephone Encounter (Signed)
4:50pm - This CM called Tomi Bamberger, CM re.the status of the home care services and the patient's discharge. [facility] will be providing home care services for the patient.She had informed this CM that the patient's prescriptions were initially faxed to [facility]; but the patient was just discharged and left with daughter. She gave his daughter paper prescriptions and instructed her to go right to the [facility] to have them filled there because he has difficulty managing the cost of his medications.  She said that she  provided her with the address for the [facility].  He will be staying with his sister - Olin Hauser # [phone].

## 2015-06-03 NOTE — Discharge Summary (Signed)
Physician Discharge Summary  [name] MRN: [id number] DOB/AGE: Nov 06, 1951 64 y.o.  PCP: Angelica Chessman, MD   Admit date: 05/23/2015 Discharge date: 06/03/2015  Discharge Diagnoses:     Principal Problem:   Chest pain Active Problems:   Essential hypertension   Coronary atherosclerosis   Acute on chronic respiratory failure with hypoxia   COPD (chronic obstructive pulmonary disease)   Respiratory distress   OSA (obstructive sleep apnea)   History of CHF (congestive heart failure)   DM (diabetes mellitus) type II uncontrolled, periph vascular disorder   Esophageal reflux   Lung abscess RUL   Sepsis   Chronic respiratory failure with hypercapnia   NSTEMI (non-ST elevated myocardial infarction)    Follow-up recommendations Follow-up with PCP in 3-5 days , including although additional recommended appointments as below Follow-up CBC, CMP in 3-5 days      Medication List    STOP taking these medications        aspirin 325 MG tablet  Replaced by:  aspirin 81 MG EC tablet     metFORMIN 500 MG tablet  Commonly known as:  GLUCOPHAGE     metoprolol 50 MG tablet  Commonly known as:  LOPRESSOR     vancomycin 1,250 mg in sodium chloride 0.9 % 250 mL      TAKE these medications        acetaminophen 325 MG tablet  Commonly known as:  TYLENOL  Take 2 tablets (650 mg total) by mouth every 6 (six) hours as needed for mild pain (or Fever >/= 101).     albuterol (2.5 MG/3ML) 0.083% nebulizer solution  Commonly known as:  PROVENTIL  Take 3 mLs (2.5 mg total) by nebulization every 6 (six) hours as needed for wheezing or shortness of breath. Dx 496     albuterol 108 (90 BASE) MCG/ACT inhaler  Commonly known as:  PROVENTIL HFA;VENTOLIN HFA  Inhale 2 puffs into the lungs every 4 (four) hours as needed for wheezing or shortness of breath.     ALPRAZolam 0.25 MG tablet  Commonly known as:  XANAX  Take 1 tablet (0.25 mg total) by mouth 2 (two) times daily as needed for  anxiety.     aspirin 81 MG EC tablet  Take 1 tablet (81 mg total) by mouth daily.     benzonatate 200 MG capsule  Commonly known as:  TESSALON  Take 1 capsule (200 mg total) by mouth 3 (three) times daily.     bisoprolol 5 MG tablet  Commonly known as:  ZEBETA  Take 1 tablet (5 mg total) by mouth 2 (two) times daily.     clopidogrel 75 MG tablet  Commonly known as:  PLAVIX  Take 1 tablet (75 mg total) by mouth daily.     fentaNYL 12 MCG/HR  Commonly known as:  DURAGESIC - dosed mcg/hr  Place 1 patch (12.5 mcg total) onto the skin every 3 (three) days.     fluticasone 50 MCG/ACT nasal spray  Commonly known as:  FLONASE  Place 2 sprays into both nostrils 2 (two) times daily.     furosemide 20 MG tablet  Commonly known as:  LASIX  Take 3 tablets (60 mg total) by mouth 2 (two) times daily.     guaiFENesin 100 MG/5ML Soln  Commonly known as:  ROBITUSSIN  Take 10 mLs (200 mg total) by mouth every 6 (six) hours.     insulin glargine 100 UNIT/ML injection  Commonly known as:  LANTUS  Inject  0.1 mLs (10 Units total) into the skin daily.     ipratropium 0.02 % nebulizer solution  Commonly known as:  ATROVENT  Take 2.5 mLs (0.5 mg total) by nebulization every 4 (four) hours as needed for wheezing or shortness of breath.     mometasone-formoterol 100-5 MCG/ACT Aero  Commonly known as:  DULERA  Inhale 2 puffs into the lungs 2 (two) times daily.     nitroGLYCERIN 2 % ointment  Commonly known as:  NITROGLYN  Apply 0.5 inches topically every 6 (six) hours.     nystatin 100000 UNIT/ML suspension  Commonly known as:  MYCOSTATIN  Take 5 mLs (500,000 Units total) by mouth 4 (four) times daily.     pantoprazole 40 MG tablet  Commonly known as:  PROTONIX  Take 1 tablet (40 mg total) by mouth 2 (two) times daily.     potassium chloride SA 20 MEQ tablet  Commonly known as:  K-DUR,KLOR-CON  Take 1 tablet (20 mEq total) by mouth daily.     pravastatin 20 MG tablet  Commonly known  as:  PRAVACHOL  Take 1 tablet (20 mg total) by mouth daily at 6 PM.     predniSONE 10 MG tablet  Commonly known as:  DELTASONE  Take 5 tablets daily for 3 days then 3 tablet daily     sulfamethoxazole-trimethoprim 800-160 MG per tablet  Commonly known as:  BACTRIM DS,SEPTRA DS  Take 1 tablet by mouth every 12 (twelve) hours.     traMADol 50 MG tablet  Commonly known as:  ULTRAM  Take 2 tablets (100 mg total) by mouth every 6 (six) hours as needed for moderate pain.         Discharge Condition:  Guarded prognosis   Disposition: home with sister    Consults:  pulmonary   Significant Diagnostic Studies:  Dg Chest 2 View  05/15/2015   CLINICAL DATA:  Pneumonia and right upper lobe abscess.  EXAM: CHEST - 2 VIEW  COMPARISON:  05/14/2015  FINDINGS: The anterior right upper lobe abscess containing an air-fluid level likely has enlarged with maximum diameter approaching 10 cm in the lateral projection. There is more fluid in the abscess with air-fluid level visualized dependently. This abscess developed quite quickly and was not present on 05/04/2015. Followup CT of the chest would be helpful for further characterization, as there may be a component of bronchopleural fistula or overt communication with a bronchus. No pneumothorax is identified.  No pulmonary edema or significant pleural fluid identified. The heart size and mediastinal contours remain normal.  IMPRESSION: Enlarging right upper lobe pulmonary abscess. Followup CT of the chest would be helpful for further characterization, as there may be an overt communication with a bronchus or component of bronchopleural fistula.   Electronically Signed   By: Aletta Edouard M.D.   On: 05/15/2015 08:09   Dg Chest 2 View  05/14/2015   CLINICAL DATA:  Chest pain and cough.  EXAM: CHEST  2 VIEW  COMPARISON:  05/12/2015.  05/04/2015.  CT 03/20/2015 .  FINDINGS: Left PICC line stable position. Mediastinum hilar structures are normal. Cavitary  lesion containing a small amount of fluid is noted in the right upper lobe. This could represent fluid within a bullae secondary to infection. Developing abscess cannot be excluded. No pleural effusion or pneumothorax. Stable cardiomegaly with normal pulmonary vascularity. Diffuse multilevel degenerative change with multiple compression fractures again noted.  IMPRESSION: 1.  Left PICC line stable position.  2. Right upper lobe  bullae containing fluid, possibly secondary to infection. Developing pulmonary abscess cannot be excluded .   Electronically Signed   By: Marcello Moores  Register   On: 05/14/2015 08:24   Dg Thoracic Spine 2 View  05/15/2015   CLINICAL DATA:  Diffuse back pain following a recent fall. History of chronic mid back pain.  EXAM: THORACIC SPINE - 2 VIEW  COMPARISON:  Chest CT obtained earlier today.  FINDINGS: Previously noted multiple chronic thoracic and upper lumbar vertebral compression deformities. No acute fractures or subluxations seen. One of the lower thoracic vertebral compression deformities has a linear vacuum phenomenon beneath the superior endplate, unchanged from the CT earlier. Left PICC tip in the inferior aspect of the superior vena cava. Thoracic and upper lumbar spine degenerative changes. The previously noted right upper lobe cavitary lesion is again demonstrated.  IMPRESSION: 1. Stable multiple thoracic and upper lumbar vertebral compression deformities compared to the CT earlier today. These were also previously compared to previous radiographs and shown to be stable. 2. Left PICC tip in the inferior aspect of the superior vena cava. If a position at the cavoatrial junction is desired, it would be recommended that this be advanced 2 cm. 3. Stable right upper lobe cavitary lesion.   Electronically Signed   By: Claudie Revering M.D.   On: 05/15/2015 21:41   Ct Chest W Contrast  05/15/2015   CLINICAL DATA:  Abnormal chest radiograph, right upper lobe pneumonia  EXAM: CT CHEST WITH  CONTRAST  TECHNIQUE: Multidetector CT imaging of the chest was performed during intravenous contrast administration.  CONTRAST:  11mL OMNIPAQUE IOHEXOL 300 MG/ML  SOLN  COMPARISON:  Chest radiograph dated 05/15/2015. CTA chest dated 03/20/2015.  FINDINGS: Mediastinum/Nodes: Heart is normal in size. No pericardial effusion.  Coronary atherosclerosis. Postsurgical changes related to prior CABG.  Small mediastinal lymph nodes, including a 9 mm short axis right paratracheal node (series 2/ image 26), likely reactive.  Visualized thyroid is unremarkable.  Lungs/Pleura: 6.7 x 8.5 x 7.7 cm mildly thick-walled cavitary lesion in the anterior/medial right upper lobe (series 3/image 23 with associated air-fluid level and surrounding ground-glass opacity/inflammatory changes. Lesion was not present on prior CT. These findings are most suggestive of pulmonary abscess.  Underlying mild emphysematous changes.  No pleural effusion or pneumothorax.  Upper abdomen: Visualized upper abdomen is notable for vascular calcifications.  Musculoskeletal: Degenerative changes of the visualized thoracolumbar spine.  Multiple mild to moderate thoracic compression fracture deformities involving the mid to lower thoracic spine, chronic.  IMPRESSION: 8.5 cm cavitary lesion with air-fluid level and surrounding inflammatory changes in the medial right upper lobe, new from prior CT, compatible with pulmonary abscess.  9 mm short axis right paratracheal node, likely reactive.  Consider follow-up CT chest in 4-6 weeks to document improvement/resolution.   Electronically Signed   By: Julian Hy M.D.   On: 05/15/2015 13:56   Dg Chest Port 1 View  05/27/2015   CLINICAL DATA:  Chronic respiratory failure with hypercapnia.  EXAM: PORTABLE CHEST - 1 VIEW  COMPARISON:  05/27/2015  FINDINGS: There is reduced opacity in the right upper lobe, with mild residual linear opacities in the area. No confluent airspace consolidation is evident. The pulmonary  vasculature is normal. There is no large pleural effusion.  There is a satisfactorily positioned left upper extremity PICC line with tip in the low SVC.  IMPRESSION: Reducing right upper lobe opacity. No acute cardiopulmonary findings.   Electronically Signed   By: Valerie Roys.D.  On: 05/27/2015 22:10   Dg Chest Port 1 View  05/27/2015   CLINICAL DATA:  Shortness of breath and respiratory distress  EXAM: PORTABLE CHEST - 1 VIEW  COMPARISON:  05/23/2015  FINDINGS: Cardiac shadow is stable. Cavitary lesion is again noted in the right upper lobe stable from the prior study. Previously seen atelectasis has resolved in the interval. No focal infiltrate is seen. No bony abnormality is noted.  IMPRESSION: Stable right upper lobe cavitary lesion.  No other focal abnormality is seen.   Electronically Signed   By: Inez Catalina M.D.   On: 05/27/2015 13:00   Dg Chest Port 1 View  05/23/2015   CLINICAL DATA:  Acute chest pain today with chest congestion, shortness of breath, and wheezing. Left arm numbness.  EXAM: PORTABLE CHEST - 1 VIEW  COMPARISON:  05/16/2015, 04/03/2015 and chest CT dated 05/15/2015  FINDINGS: PICC tip just above the cavoatrial junction, unchanged. There are new small Kerley B-lines at the left base and there is new slight atelectasis at the right base. Heart size and pulmonary vascularity are normal. Right lung abscess is smaller than on the prior study. No acute osseous abnormality.  IMPRESSION: 1. Decreased cavitary right upper lobe  lung abscess. 2. Bibasilar atelectasis with possible interstitial edema at the left lung base.   Electronically Signed   By: Lorriane Shire M.D.   On: 05/23/2015 17:51   Dg Chest Port 1 View  05/16/2015   CLINICAL DATA:  Shortness of breath, respiratory failure  EXAM: PORTABLE CHEST - 1 VIEW  COMPARISON:  05/15/2015  FINDINGS: Medial right upper lobe large cavitary mass again evident consistent with a pulmonary abscess when compared to yesterday's chest CT.  This measures approximate 8.8 x 8.3 cm by plain radiography. Left lung remains clear. No developing effusion. Negative for pneumothorax. Trachea midline. Normal heart size and vascularity. Left PICC line tip mid SVC level.  IMPRESSION: Medial right upper lobe large cavitary mass compatible with a pulmonary abscess. No new finding by plain radiography.   Electronically Signed   By: Jerilynn Mages.  Shick M.D.   On: 05/16/2015 07:56   Dg Chest Port 1 View  05/12/2015   CLINICAL DATA:  Dyspnea  EXAM: PORTABLE CHEST - 1 VIEW  COMPARISON:  05/04/2015  FINDINGS: New ill-defined airspace disease in the right upper lung.  Background of hyperinflation and emphysematous change. No edema, effusion, or pneumothorax.  Normal heart size and mediastinal contours.  IMPRESSION: 1. Right upper lobe pneumonia, new from 05/04/2015. 2. Emphysema.   Electronically Signed   By: Monte Fantasia M.D.   On: 05/12/2015 03:14   Dg Chest Portable 1 View  05/05/2015   CLINICAL DATA:  Chest pain and shortness of breath.  EXAM: PORTABLE CHEST - 1 VIEW  COMPARISON:  04/21/2015  FINDINGS: Lungs remain hyperinflated. The cardiomediastinal contours are unchanged, heart at the upper limits of normal. Bibasilar atelectasis or scarring is again seen. Pulmonary vasculature is normal. No consolidation, pleural effusion, or pneumothorax. No acute osseous abnormalities are seen.  IMPRESSION: Unchanged hyperinflation without acute pulmonary process.   Electronically Signed   By: Jeb Levering M.D.   On: 05/05/2015 00:45        Filed Weights   05/29/15 0400 06/01/15 0315 06/02/15 0400  Weight: 86.3 kg (190 lb 4.1 oz) 82.237 kg (181 lb 4.8 oz) 82.781 kg (182 lb 8 oz)     Microbiology: Recent Results (from the past 240 hour(s))  Culture, sputum-assessment     Status: None  Collection Time: 05/27/15 12:09 PM  Result Value Ref Range Status   Specimen Description SPU  Final   Special Requests NONE  Final   Sputum evaluation   Final    THIS SPECIMEN  IS ACCEPTABLE. RESPIRATORY CULTURE REPORT TO FOLLOW.   Report Status 05/27/2015 FINAL  Final  Culture, respiratory (NON-Expectorated)     Status: None   Collection Time: 05/27/15 12:09 PM  Result Value Ref Range Status   Specimen Description SPUTUM  Final   Special Requests NONE  Final   Gram Stain   Final    FEW WBC PRESENT,BOTH PMN AND MONONUCLEAR NO SQUAMOUS EPITHELIAL CELLS SEEN FEW GRAM POSITIVE COCCI IN PAIRS IN CLUSTERS Performed at [facility]    Culture   Final    ABUNDANT METHICILLIN RESISTANT STAPHYLOCOCCUS AUREUS Note: RIFAMPIN AND GENTAMICIN SHOULD NOT BE USED AS SINGLE DRUGS FOR TREATMENT OF STAPH INFECTIONS. CRITICAL RESULT CALLED TO, READ BACK BY AND VERIFIED WITH: LATAVIA H 6/9 $Rem'@8'OzdG$  BY REAMM Performed at [facility]    Report Status 05/29/2015 FINAL  Final   Organism ID, Bacteria METHICILLIN RESISTANT STAPHYLOCOCCUS AUREUS  Final      Susceptibility   Methicillin resistant staphylococcus aureus - MIC*    CLINDAMYCIN >=8 RESISTANT Resistant     ERYTHROMYCIN >=8 RESISTANT Resistant     GENTAMICIN <=0.5 SENSITIVE Sensitive     LEVOFLOXACIN >=8 RESISTANT Resistant     OXACILLIN >=4 RESISTANT Resistant     PENICILLIN >=0.5 RESISTANT Resistant     RIFAMPIN <=0.5 SENSITIVE Sensitive     TRIMETH/SULFA <=10 SENSITIVE Sensitive     VANCOMYCIN <=0.5 SENSITIVE Sensitive     TETRACYCLINE <=1 SENSITIVE Sensitive     * ABUNDANT METHICILLIN RESISTANT STAPHYLOCOCCUS AUREUS       Blood Culture    Component Value Date/Time   SDES SPU 05/27/2015 1209   SDES SPUTUM 05/27/2015 1209   SPECREQUEST NONE 05/27/2015 1209   SPECREQUEST NONE 05/27/2015 1209   CULT  05/27/2015 1209    ABUNDANT METHICILLIN RESISTANT STAPHYLOCOCCUS AUREUS Note: RIFAMPIN AND GENTAMICIN SHOULD NOT BE USED AS SINGLE DRUGS FOR TREATMENT OF STAPH INFECTIONS. CRITICAL RESULT CALLED TO, READ BACK BY AND VERIFIED WITH: LATAVIA H 6/9 $Rem'@8'rIgp$  BY REAMM Performed at [place] 05/27/2015 FINAL 05/27/2015 1209   REPTSTATUS 05/29/2015 FINAL 05/27/2015 1209      Labs: Results for orders placed or performed during the hospital encounter of 05/23/15 (from the past 48 hour(s))  Glucose, capillary     Status: Abnormal   Collection Time: 06/01/15 11:59 AM  Result Value Ref Range   Glucose-Capillary 295 (H) 65 - 99 mg/dL  Glucose, capillary     Status: Abnormal   Collection Time: 06/01/15  4:23 PM  Result Value Ref Range   Glucose-Capillary 264 (H) 65 - 99 mg/dL  Glucose, capillary     Status: Abnormal   Collection Time: 06/02/15 12:26 AM  Result Value Ref Range   Glucose-Capillary 161 (H) 65 - 99 mg/dL  Glucose, capillary     Status: None   Collection Time: 06/02/15  7:43 AM  Result Value Ref Range   Glucose-Capillary 95 65 - 99 mg/dL  Glucose, capillary     Status: Abnormal   Collection Time: 06/02/15 11:26 AM  Result Value Ref Range   Glucose-Capillary 184 (H) 65 - 99 mg/dL  Comprehensive metabolic panel     Status: Abnormal   Collection Time: 06/02/15 12:00 PM  Result Value Ref Range  Sodium 133 (L) 135 - 145 mmol/L   Potassium 4.4 3.5 - 5.1 mmol/L   Chloride 86 (L) 101 - 111 mmol/L   CO2 38 (H) 22 - 32 mmol/L   Glucose, Bld 211 (H) 65 - 99 mg/dL   BUN 16 6 - 20 mg/dL   Creatinine, Ser 0.65 0.61 - 1.24 mg/dL   Calcium 9.0 8.9 - 10.3 mg/dL   Total Protein 5.5 (L) 6.5 - 8.1 g/dL   Albumin 2.7 (L) 3.5 - 5.0 g/dL   AST 19 15 - 41 U/L   ALT 25 17 - 63 U/L   Alkaline Phosphatase 112 38 - 126 U/L   Total Bilirubin 0.4 0.3 - 1.2 mg/dL   GFR calc non Af Amer >60 >60 mL/min   GFR calc Af Amer >60 >60 mL/min    Comment: (NOTE) The eGFR has been calculated using the CKD EPI equation. This calculation has not been validated in all clinical situations. eGFR's persistently <60 mL/min signify possible Chronic Kidney Disease.    Anion gap 9 5 - 15  CBC     Status: Abnormal   Collection Time: 06/02/15 12:00 PM  Result Value Ref Range   WBC  11.4 (H) 4.0 - 10.5 K/uL   RBC 3.57 (L) 4.22 - 5.81 MIL/uL   Hemoglobin 9.9 (L) 13.0 - 17.0 g/dL   HCT 31.3 (L) 39.0 - 52.0 %   MCV 87.7 78.0 - 100.0 fL   MCH 27.7 26.0 - 34.0 pg   MCHC 31.6 30.0 - 36.0 g/dL   RDW 16.3 (H) 11.5 - 15.5 %   Platelets 215 150 - 400 K/uL  Glucose, capillary     Status: Abnormal   Collection Time: 06/02/15  4:24 PM  Result Value Ref Range   Glucose-Capillary 258 (H) 65 - 99 mg/dL  Glucose, capillary     Status: Abnormal   Collection Time: 06/02/15  9:33 PM  Result Value Ref Range   Glucose-Capillary 140 (H) 65 - 99 mg/dL  Comprehensive metabolic panel     Status: Abnormal   Collection Time: 06/03/15  6:30 AM  Result Value Ref Range   Sodium 134 (L) 135 - 145 mmol/L   Potassium 4.1 3.5 - 5.1 mmol/L   Chloride 86 (L) 101 - 111 mmol/L   CO2 38 (H) 22 - 32 mmol/L   Glucose, Bld 106 (H) 65 - 99 mg/dL   BUN 15 6 - 20 mg/dL   Creatinine, Ser 0.65 0.61 - 1.24 mg/dL   Calcium 8.8 (L) 8.9 - 10.3 mg/dL   Total Protein 5.3 (L) 6.5 - 8.1 g/dL   Albumin 2.8 (L) 3.5 - 5.0 g/dL   AST 17 15 - 41 U/L   ALT 26 17 - 63 U/L   Alkaline Phosphatase 111 38 - 126 U/L   Total Bilirubin 0.3 0.3 - 1.2 mg/dL   GFR calc non Af Amer >60 >60 mL/min   GFR calc Af Amer >60 >60 mL/min    Comment: (NOTE) The eGFR has been calculated using the CKD EPI equation. This calculation has not been validated in all clinical situations. eGFR's persistently <60 mL/min signify possible Chronic Kidney Disease.    Anion gap 10 5 - 15  Glucose, capillary     Status: Abnormal   Collection Time: 06/03/15  7:34 AM  Result Value Ref Range   Glucose-Capillary 108 (H) 65 - 99 mg/dL     Lipid Panel     Component Value Date/Time   CHOL 208*  01/10/2014 1215   TRIG 329* 01/10/2014 1215   HDL 92 01/10/2014 1215   CHOLHDL 2.3 01/10/2014 1215   VLDL 66* 01/10/2014 1215   LDLCALC 50 01/10/2014 1215     Lab Results  Component Value Date   HGBA1C 6.8* 05/07/2015   HGBA1C 7.4* 10/28/2014    HGBA1C 6.5 07/08/2014     Lab Results  Component Value Date   MICROALBUR 0.50 11/24/2010   LDLCALC 50 01/10/2014   CREATININE 0.65 06/03/2015     HPI/Brief narrative 64 year old male with history of CAD, COPD on home oxygen 3 L/m, chronic diastolic CHF (EF 67-34 percent by echo 5/18), HTN, DM 2, chronic pain, anemia, NSVT, morbid obesity tobacco abuse, recent hospitalization 05/04/15-05/19/15 related to acute on chronic respiratory failure from COPD exacerbation, aspiration pneumonia and RUL abscess. TCTS consulted-not surgical candidate due to severe COPD and recommended drain placement. Eventually pulmonology recommended discharge on IV vancomycin 1 week followed by Bactrim PO 10 days and outpatient follow-up with pulmonology. Patient readmitted on 05/23/15 for left-sided chest pain.   Assessment/Plan:  NSTEMI - Mildly elevated troponin but with downward trend, 0.11 > 0.08 > 0.06 >then decreased gradually to normal - New EKG changes/anterolateral T-wave inversions on 6/4 compared to the day prior. - Repeat 2-D echo 6/4: LVEF 45-50 percent, hypokinesis of the inferior myocardium and grade 1 diastolic dysfunction and no pericardial effusion. EF has worsened since echo 5/18 - Likely precipitated by severe anemia in the context of underlying CAD - Transfuse to keep hemoglobin >8 g per DL. - Continue aspirin, Plavix and beta blockers. Intolerant to statins. No IV heparin  Given secondary to severe anemia.  - Status post cardiac cath 05/26/15: Results as below. Medical therapy recommended. As per Dr.Varanasi , cardiology: Continue dual antiplatelet therapy, bisoprolol. Statin    Acute on chronic anemia - Hemoglobin lately has been in the 8 g per DL range. - Hb dropped from 8.7 > 6.9 in 12 hours in the absence of overt bleeding or melena. - s/p 1 unit of PRBC 6/4 Hemoglobin ranging from 8-9    Mild acute on chronic combined diastolic/new systolic CHF Continue  Lasix to by mouth 60 mg  twice a day Continue diuresis, renal function stable    Severe COPD (oxygen and steroid dependent) with exacerbation/acute on chronic hypoxemic respiratory failure on home oxygen 3 L/m - Continue oxygen and bronchodilators - Steroids switched to oral by pulmonology (Prednsione 20 mg daily). Chest tightness seems to have improved after the patient was restarted on IV Solu-Medrol 6/9, continue prednisone 40 mg a day, and do a slow outpatient taper Completed IV vancomycin for lung abscess on 05/27/15 currently on oral Bactrim started on 05/28/15, patient to follow-up with Dr. Lamonte Sakai prior to stopping Bactrim - Pulmonary follow-up 6/6 appreciated: Recommend continuing DuoNeb QID + when necessary Xopenex, continue Pulmicort, Flonase and slow prednisone taper to lowest effective dose. Last chest x-ray on 6/7 showed right upper lobe opacity which was found to be reduced in size no other cardiopulmonary findings Continue Xopenex, out of bed to chair, aggressive pulmonary toilet, incentive spirometry , continues to improve encouraged ambulation and out of bed to chair again today   MRSA RUL lung abscess/pneumonia - As per chest x-ray, decreased cavitatory RUL abscess - Per thoracic surgery: Nothing for them to offer at this time. - Pulmonary consultation appreciated: No role for percutaneous drainage which risks bronchopleural fistula/empyema. Continue IV vancomycin while hospitalized then oral Bactrim until outpatient follow-up with pulmonology/Dr. Lamonte Sakai - Blood  Cultures 2: Negative to date - Patient has completed IV vancomycin 05/27/15, discontinue PICC line prior to DC Started oral Bactrim 6/8-continue until outpatient follow-up with Dr. Lamonte Sakai. SNF to arrange for pulmonary follow-up.   Type II DM  - Continue low-dose Lantus and SSI .    GERD  - Continue PPI   Hypokalemia Replete   Chronic pain - Currently controlled     Discharge Exam:    Blood pressure 168/80, pulse 69, temperature  97.7 F (36.5 C), temperature source Oral, resp. rate 12, height $RemoveBe'5\' 11"'LLCycsYsz$  (1.803 m), weight 82.781 kg (182 lb 8 oz), SpO2 100 %.   General exam: Moderately built and nourished middle-aged male sitting propped up in bed with mild increased work of breathing.  Respiratory system: Reduced breath sounds bilaterally with scattered few bilateral medium pitched expiratory rhonchi. Mild increased work of breathing. Cardiovascular system: S1 & S2 heard, RRR. No JVD, murmurs, gallops, clicks. Trace bilateral pitting pedal edema. Telemetry: Sinus rhythm Gastrointestinal system: Abdomen is obese/nondistended, soft and nontender. Normal bowel sounds heard. Central nervous system: Alert and oriented. No focal neurological deficits. Extremities: Symmetric 5 x 5 power.      Discharge Instructions    Diet - low sodium heart healthy    Complete by:  As directed      Increase activity slowly    Complete by:  As directed            Follow-up Information    Follow up with [facility] Gobble., MD On 06/12/2015.   Specialty:  Pulmonary Disease   Why:  Appt at 2:30 PM    Contact information:   [address] [address] [phone]       Follow up with Truitt Merle, NP On 06/04/2015.   Specialties:  Nurse Practitioner, Interventional Cardiology, Cardiology, Radiology   Why:  11:30am. If you prefer to followup with your cardiologist in [facility], please give [place] a call to cancel this appt   Contact information:   [address]  [place] [address] [phone]       Follow up with [facility]     On 06/06/2015.   Why:  Transitional [facility] Clinic appointment on 06/06/15 at 11:15 am with Dr. Jarold Song.   Contact information:   [place] [address] [phone]      Signed: Reyne Dumas 06/03/2015, 10:40 AM        Time spent >45 mins

## 2015-06-03 NOTE — Progress Notes (Signed)
Inpatient Diabetes Program Recommendations  AACE/ADA: New Consensus Statement on Inpatient Glycemic Control (2013)  Target Ranges:  Prepandial:   less than 140 mg/dL      Peak postprandial:   less than 180 mg/dL (1-2 hours)      Critically ill patients:  140 - 180 mg/dL   Results for WILNER, LAURICH (MRN [id number]) as of 06/03/2015 07:46  Ref. Range 06/02/2015 07:43 06/02/2015 11:26 06/02/2015 16:24 06/02/2015 21:33  Glucose-Capillary Latest Ref Range: 65-99 mg/dL 95 387 (H) 564 (H) 332 (H)   Reason for Visit: CP/SOB  Diabetes history: DM 2 Outpatient Diabetes medications: Lantus 10 units QHS, Metformin 500 mg BID Current orders for Inpatient glycemic control: Novolog 0-9 units TID, Lantus 10 units QHS  Inpatient Diabetes Program Recommendations Correction (SSI): Glucose increases around meal times. If patient remains inpatient, please consider increasing correction to Novolog Moderate scale (0-15 units) TID.  Thanks,  Christena Deem RN, MSN, [facility] Inpatient Diabetes Coordinator Team Pager [phone]

## 2015-06-04 ENCOUNTER — Encounter: Payer: Medicare Other | Admitting: Nurse Practitioner

## 2015-06-04 ENCOUNTER — Telehealth: Payer: Self-pay | Admitting: Emergency Medicine

## 2015-06-04 NOTE — Telephone Encounter (Signed)
Spoke with [name], states that pt is needing someone to be responsible for pt's home health(skilled nursing, pt, and ot) until pt establishes with a pcp that will take over care.  Pt is scheduled to establish with new PCP on Friday at 11:15-[place] states that pt's home health will have to be held until a provider assumes responsibility for this.  RB please advise if you're ok with being responsible for pt's home health in the interim.  Thanks!

## 2015-06-05 ENCOUNTER — Encounter (HOSPITAL_COMMUNITY): Payer: Self-pay | Admitting: *Deleted

## 2015-06-05 ENCOUNTER — Telehealth: Payer: Self-pay

## 2015-06-05 ENCOUNTER — Emergency Department (HOSPITAL_COMMUNITY)
Admission: EM | Admit: 2015-06-05 | Discharge: 2015-06-05 | Disposition: A | Discharge status: Home / Self Care | Payer: Medicare Other | Source: Home / Self Care | Attending: Emergency Medicine | Admitting: Emergency Medicine

## 2015-06-05 ENCOUNTER — Other Ambulatory Visit: Payer: Self-pay

## 2015-06-05 ENCOUNTER — Emergency Department (HOSPITAL_COMMUNITY): Payer: Medicare Other

## 2015-06-05 DIAGNOSIS — I2510 Atherosclerotic heart disease of native coronary artery without angina pectoris: Secondary | ICD-10-CM | POA: Insufficient documentation

## 2015-06-05 DIAGNOSIS — Y998 Other external cause status: Secondary | ICD-10-CM | POA: Insufficient documentation

## 2015-06-05 DIAGNOSIS — Z955 Presence of coronary angioplasty implant and graft: Secondary | ICD-10-CM

## 2015-06-05 DIAGNOSIS — E785 Hyperlipidemia, unspecified: Secondary | ICD-10-CM | POA: Diagnosis present

## 2015-06-05 DIAGNOSIS — Z7951 Long term (current) use of inhaled steroids: Secondary | ICD-10-CM | POA: Insufficient documentation

## 2015-06-05 DIAGNOSIS — Z794 Long term (current) use of insulin: Secondary | ICD-10-CM | POA: Insufficient documentation

## 2015-06-05 DIAGNOSIS — E1151 Type 2 diabetes mellitus with diabetic peripheral angiopathy without gangrene: Secondary | ICD-10-CM | POA: Diagnosis present

## 2015-06-05 DIAGNOSIS — G4733 Obstructive sleep apnea (adult) (pediatric): Secondary | ICD-10-CM

## 2015-06-05 DIAGNOSIS — I10 Essential (primary) hypertension: Secondary | ICD-10-CM

## 2015-06-05 DIAGNOSIS — I252 Old myocardial infarction: Secondary | ICD-10-CM

## 2015-06-05 DIAGNOSIS — Z8673 Personal history of transient ischemic attack (TIA), and cerebral infarction without residual deficits: Secondary | ICD-10-CM

## 2015-06-05 DIAGNOSIS — Z7902 Long term (current) use of antithrombotics/antiplatelets: Secondary | ICD-10-CM

## 2015-06-05 DIAGNOSIS — Z87891 Personal history of nicotine dependence: Secondary | ICD-10-CM

## 2015-06-05 DIAGNOSIS — E1165 Type 2 diabetes mellitus with hyperglycemia: Secondary | ICD-10-CM | POA: Diagnosis present

## 2015-06-05 DIAGNOSIS — G8929 Other chronic pain: Secondary | ICD-10-CM

## 2015-06-05 DIAGNOSIS — Z7982 Long term (current) use of aspirin: Secondary | ICD-10-CM | POA: Insufficient documentation

## 2015-06-05 DIAGNOSIS — Z8701 Personal history of pneumonia (recurrent): Secondary | ICD-10-CM | POA: Insufficient documentation

## 2015-06-05 DIAGNOSIS — Z9861 Coronary angioplasty status: Secondary | ICD-10-CM

## 2015-06-05 DIAGNOSIS — R627 Adult failure to thrive: Secondary | ICD-10-CM | POA: Diagnosis present

## 2015-06-05 DIAGNOSIS — L8990 Pressure ulcer of unspecified site, unspecified stage: Secondary | ICD-10-CM

## 2015-06-05 DIAGNOSIS — Z9981 Dependence on supplemental oxygen: Secondary | ICD-10-CM

## 2015-06-05 DIAGNOSIS — I509 Heart failure, unspecified: Secondary | ICD-10-CM

## 2015-06-05 DIAGNOSIS — Z79899 Other long term (current) drug therapy: Secondary | ICD-10-CM | POA: Insufficient documentation

## 2015-06-05 DIAGNOSIS — Z66 Do not resuscitate: Secondary | ICD-10-CM | POA: Diagnosis present

## 2015-06-05 DIAGNOSIS — Y9389 Activity, other specified: Secondary | ICD-10-CM

## 2015-06-05 DIAGNOSIS — K219 Gastro-esophageal reflux disease without esophagitis: Secondary | ICD-10-CM

## 2015-06-05 DIAGNOSIS — R0602 Shortness of breath: Secondary | ICD-10-CM | POA: Diagnosis not present

## 2015-06-05 DIAGNOSIS — Z7952 Long term (current) use of systemic steroids: Secondary | ICD-10-CM

## 2015-06-05 DIAGNOSIS — Y9289 Other specified places as the place of occurrence of the external cause: Secondary | ICD-10-CM | POA: Insufficient documentation

## 2015-06-05 DIAGNOSIS — B9562 Methicillin resistant Staphylococcus aureus infection as the cause of diseases classified elsewhere: Secondary | ICD-10-CM | POA: Diagnosis present

## 2015-06-05 DIAGNOSIS — S300XXA Contusion of lower back and pelvis, initial encounter: Secondary | ICD-10-CM

## 2015-06-05 DIAGNOSIS — I5032 Chronic diastolic (congestive) heart failure: Secondary | ICD-10-CM | POA: Diagnosis present

## 2015-06-05 DIAGNOSIS — I5033 Acute on chronic diastolic (congestive) heart failure: Secondary | ICD-10-CM | POA: Diagnosis present

## 2015-06-05 DIAGNOSIS — W19XXXA Unspecified fall, initial encounter: Secondary | ICD-10-CM | POA: Insufficient documentation

## 2015-06-05 DIAGNOSIS — J851 Abscess of lung with pneumonia: Secondary | ICD-10-CM | POA: Diagnosis present

## 2015-06-05 DIAGNOSIS — J15212 Pneumonia due to Methicillin resistant Staphylococcus aureus: Principal | ICD-10-CM | POA: Diagnosis present

## 2015-06-05 DIAGNOSIS — J441 Chronic obstructive pulmonary disease with (acute) exacerbation: Secondary | ICD-10-CM | POA: Insufficient documentation

## 2015-06-05 DIAGNOSIS — E119 Type 2 diabetes mellitus without complications: Secondary | ICD-10-CM

## 2015-06-05 DIAGNOSIS — F419 Anxiety disorder, unspecified: Secondary | ICD-10-CM | POA: Diagnosis present

## 2015-06-05 DIAGNOSIS — J9612 Chronic respiratory failure with hypercapnia: Secondary | ICD-10-CM | POA: Diagnosis present

## 2015-06-05 DIAGNOSIS — M549 Dorsalgia, unspecified: Secondary | ICD-10-CM | POA: Diagnosis present

## 2015-06-05 DIAGNOSIS — Z515 Encounter for palliative care: Secondary | ICD-10-CM

## 2015-06-05 DIAGNOSIS — J9621 Acute and chronic respiratory failure with hypoxia: Secondary | ICD-10-CM | POA: Diagnosis present

## 2015-06-05 LAB — I-STAT TROPONIN, ED
TROPONIN I, POC: 0.03 ng/mL (ref 0.00–0.08)
Troponin i, poc: 0.02 ng/mL (ref 0.00–0.08)

## 2015-06-05 LAB — BASIC METABOLIC PANEL
Anion gap: 11 (ref 5–15)
BUN: 27 mg/dL — ABNORMAL HIGH (ref 6–20)
CO2: 32 mmol/L (ref 22–32)
Calcium: 8.6 mg/dL — ABNORMAL LOW (ref 8.9–10.3)
Chloride: 90 mmol/L — ABNORMAL LOW (ref 101–111)
Creatinine, Ser: 0.95 mg/dL (ref 0.61–1.24)
GFR calc Af Amer: >60 mL/min (ref >60)
GLUCOSE: 218 mg/dL — AB (ref 65–99)
POTASSIUM: 4.1 mmol/L (ref 3.5–5.1)
Sodium: 133 mmol/L — ABNORMAL LOW (ref 135–145)

## 2015-06-05 LAB — CBC
HEMATOCRIT: 31.2 % — AB (ref 39.0–52.0)
Hemoglobin: 9.8 g/dL — ABNORMAL LOW (ref 13.0–17.0)
MCH: 28 pg (ref 26.0–34.0)
MCHC: 31.4 g/dL (ref 30.0–36.0)
MCV: 89.1 fL (ref 78.0–100.0)
Platelets: 189 10*3/uL (ref 150–400)
RBC: 3.5 MIL/uL — ABNORMAL LOW (ref 4.22–5.81)
RDW: 16.6 % — ABNORMAL HIGH (ref 11.5–15.5)
WBC: 13.6 10*3/uL — AB (ref 4.0–10.5)

## 2015-06-05 MED ORDER — HYDROCODONE-ACETAMINOPHEN 5-325 MG PO TABS
2.0000 | ORAL_TABLET | Freq: Once | ORAL | Status: AC
Start: 2015-06-05 — End: 2015-06-05
  Administered 2015-06-05: 2 via ORAL
  Filled 2015-06-05: qty 2

## 2015-06-05 MED ORDER — HYDROCODONE-ACETAMINOPHEN 5-325 MG PO TABS: 1.0000 | ORAL_TABLET | Freq: Four times a day (QID) | ORAL | Status: DC | PRN

## 2015-06-05 MED ORDER — IPRATROPIUM-ALBUTEROL 0.5-2.5 (3) MG/3ML IN SOLN
3.0000 mL | Freq: Once | RESPIRATORY_TRACT | Status: AC
  Administered 2015-06-05: 3 mL via RESPIRATORY_TRACT
  Filled 2015-06-05: qty 3

## 2015-06-05 NOTE — ED Notes (Signed)
Pt to xray

## 2015-06-05 NOTE — ED Notes (Signed)
RT at bedside.

## 2015-06-05 NOTE — ED Notes (Signed)
MD at bedside. 

## 2015-06-05 NOTE — Telephone Encounter (Signed)
Transitional [facility] Clinic Post-discharge Follow-Up Phone Call:  Date of Discharge: 06/03/15 Principal Discharge Diagnosis(es): Chest pain    Active problems:  Essential hypertension, COPD, CHF, Uncontrolled type II diabetes mellitus with PVD, Lung Abscess RUL, NSTEMI Post-discharge Communication: Call placed to patient's cell but phone number not accepting incoming calls; call also placed to patient's sister Verlon Au) who indicates patient sleeping.  Message left for patient requesting call back. Call Completed: No

## 2015-06-05 NOTE — ED Notes (Signed)
Bed: IO96 Expected date:  Expected time:  Means of arrival:  Comments: EMS- 64yo M, SOB, PNA/COPD

## 2015-06-05 NOTE — ED Notes (Signed)
RT called

## 2015-06-05 NOTE — Discharge Instructions (Signed)
Chronic Respiratory Failure Respiratory failure is when your lungs are not working well and your breathing (respiratory) system fails. When respiratory failure occurs, it is difficult for your lungs to get enough oxygen or get rid of carbon dioxide or both. Respiratory failure can be life threatening.  Respiratory failure can be acute or chronic. Acute respiratory failure is sudden, severe, and requires emergency medical treatment. Chronic respiratory failure is less severe, happens over time, and requires ongoing treatment.  CAUSES  Any problem affecting the heart or lungs can cause respiratory failure. Some of these causes may be:  Chronic bronchitis and emphysema (COPD).  Blood clot going to the lung (pulmonary embolism).  Having water in the lungs caused by heart failure, lung injury, or infection (pulmonary edema).  Collapsed lung (pneumothorax).  Pneumonia.  Pulmonary fibrosis.  Obesity.  Asthma.  Heart failure.  Any type of trauma to the chest that can make breathing difficult.  Nerve or muscle diseases making chest movements difficult. SYMPTOMS  Signs and symptoms of chronic respiratory failure include:  Shortness of breath (dyspnea) with or without activity.  Rapid, fast breathing (tachypnea).  Wheezing.  Fast heart rate.  Bluish color to the fingernail or toenail beds.  Confusion or drowsiness or both. DIAGNOSIS  Initial diagnosis requires a thorough history and a physical exam by your health care provider. Additional tests may include:  Chest X-ray.  CT scan of your lungs.  Ultrasound to check for blood clots.  Blood tests, such as an arterial blood gas test (ABG). This is a blood test that looks at the oxygen and carbon dioxide levels in your arterial blood.  Your vital signs will be taken. This includes your respiratory rate (how many times a minute you are breathing), oxygen saturation (this measures the oxygen level in your blood), heart rate, and  blood pressure. These numbers help your health care provider determine the next steps.  Electrocardiogram. TREATMENT  Treatment of chronic respiratory failure depends on the cause of the respiratory failure. Treatment can include the following:  Oxygen. Oxygen can be delivered through the following:  Nasal cannula. This is small tubing that goes in your nose to give you oxygen.  Face mask. A face mask covers your nose and mouth to give you oxygen.  Medicine. Different medicines can be given to help with breathing. These can include:  Nebulizers. Nebulizers deliver medicines to open the air passages (bronchodilators). These medicines help to open or relax the airways in the lungs so you can breathe better. They can also help loosen mucus from your lungs.  Diuretics. Diuretic medicines can help you breathe better by getting rid of extra fluid in your body.  Steroids. Steroid medicines can help decrease inflammation in your lungs.  Chest tube. If you have a collapsed lung (pneumothorax), a chest tube is placed to help reinflate the lung.  Non-invasive positive pressure ventilation (NPPV). This is a tight-fitting mask that goes over your nose and mouth. The mask has tubing that is attached to a machine. The machine blows air into the tubing, which helps to keep the tiny air sacs (alveoli) in your lungs open. This machine allows you to breathe on your own.  Ventilator. A ventilator is a breathing machine. When on a ventilator, a breathing tube is put into the lungs. A ventilator is used when you can no longer breathe well enough on your own. You may have low oxygen levels or high carbon dioxide (CO2) levels in your blood. When you are on a  ventilator, sedation and pain medicines are given to make you sleep so your lungs can heal. HOME CARE INSTRUCTIONS  Follow your health care provider's directions about medicines and respiratory therapy.  Quit smoking if you smoke. SEEK MEDICAL CARE  IF:  You have increasing shortness of breath and are less functional than you have been.  You have increased sputum, wheezing, coughing, or loss of energy.  You are on oxygen and are requiring more. SEEK IMMEDIATE MEDICAL CARE IF:  Your shortness of breath is significantly worse.  You are unable to say more than a few words without having to catch your breath.  You are much less functional. MAKE SURE YOU:  Understand these instructions.  Will watch your condition.  Will get help right away if you are not doing well or get worse. Document Released: 12/06/2005 Document Revised: 04/22/2014 Document Reviewed: 10/04/2013 [facility] Patient Information 2015 [place], [place]. This information is not intended to replace advice given to you by your health care provider. Make sure you discuss any questions you have with your health care provider.  Tailbone Injury The tailbone (coccyx) is the small bone at the lower end of the spine. A tailbone injury may involve stretched ligaments, bruising, or a broken bone (fracture). Women are more vulnerable to this injury due to having a wider pelvis. CAUSES  This type of injury typically occurs from falling and landing on the tailbone. Repeated strain or friction from actions such as rowing and bicycling may also injure the area. The tailbone can be injured during childbirth. Infections or tumors may also press on the tailbone and cause pain. Sometimes, the cause of injury is unknown. SYMPTOMS   Bruising.  Pain when sitting.  Painful bowel movements.  In women, pain during intercourse. DIAGNOSIS  Your caregiver can diagnose a tailbone injury based on your symptoms and a physical exam. X-rays may be taken if a fracture is suspected. Your caregiver may also use an MRI scan imaging test to evaluate your symptoms. TREATMENT  Your caregiver may prescribe medicines to help relieve your pain. Most tailbone injuries heal on their own in 4 to 6 weeks.  However, if the injury is caused by an infection or tumor, the recovery period may vary. PREVENTION  Wear appropriate padding and sports gear when bicycling and rowing. This can help prevent an injury from repeated strain or friction. HOME CARE INSTRUCTIONS   Put ice on the injured area.  Put ice in a plastic bag.  Place a towel between your skin and the bag.  Leave the ice on for 15-20 minutes, every hour while awake for the first 1 to 2 days.  Sit on a large, rubber or inflated ring or cushion to ease your pain. Lean forward when sitting to help decrease discomfort.  Avoid sitting for long periods of time.  Increase your activity as the pain allows.  Only take over-the-counter or prescription medicines for pain, discomfort, or fever as directed by your caregiver.  You may use stool softeners if it is painful to have a bowel movement, or as directed by your caregiver.  Eat a diet with plenty of fiber to help prevent constipation.  Keep all follow-up appointments as directed by your caregiver. SEEK MEDICAL CARE IF:   Your pain becomes worse.  Your bowel movements cause a great deal of discomfort.  You are unable to have a bowel movement.  You have a fever. MAKE SURE YOU:  Understand these instructions.  Will watch your condition.  Will  get help right away if you are not doing well or get worse. Document Released: 12/03/2000 Document Revised: 02/28/2012 Document Reviewed: 07/01/2011 [facility] Patient Information 2015 [place], [place]. This information is not intended to replace advice given to you by your health care provider. Make sure you discuss any questions you have with your health care provider.  Pressure Ulcer A pressure ulcer is a sore that has formed from the breakdown of skin and exposure of deeper layers of tissue. It develops in areas of the body where there is unrelieved pressure. Pressure ulcers are usually found over a bony area, such as the shoulder blades,  spine, lower back, hips, knees, ankles, and heels. Pressure ulcers vary in severity. Your health care provider may determine the severity (stage) of your pressure ulcer. The stages include:  Stage I--The skin is red, and when the skin is pressed, it stays red.  Stage II--The top layer of skin is gone, and there is a shallow, pink ulcer.  Stage III--The ulcer becomes deeper, and it is more difficult to see the whole wound. Also, there may be yellow or brown parts, as well as pink and red parts.  Stage IV--The ulcer may be deep and red, pink, brown, white, or yellow. Bone or muscle may be seen.  Unstageable pressure ulcer--The ulcer is covered almost completely with black, brown, or yellow tissue. It is not known how deep the ulcer is or what stage it is until this covering comes off.  Suspected deep tissue injury--A person's skin can be injured from pressure or pulling on the skin when his or her position is changed. The skin appears purple or maroon. There may not be an opening in the skin, but there could be a blood-filled blister. This deep tissue injury is often difficult to see in people with darker skin tones. The site may open and become deeper in time. However, early interventions will help the area heal and may prevent the area from opening. CAUSES  Pressure ulcers are caused by pressure against the skin that limits the flow of blood to the skin and nearby tissues. There are many risk factors that can lead to pressure sores. RISK FACTORS  Decreased ability to move.  Decreased ability to feel pain or discomfort.  Excessive skin moisture from urine, stool, sweat, or secretions.  Poor nutrition.  Dehydration.  Tobacco, drug, or alcohol abuse.  Having someone pull on bedsheets that are under you, such as when health care workers are changing your position in a hospital bed.  Obesity.  Increased adult age.  Hospitalization in a critical care unit for longer than 4 days with use  of medical devices.  Prolonged use of medical devices.  Critical illness.  Anemia.  Traumatic brain injury.  Spinal cord injury.  Stroke.  Diabetes.  Poor blood glucose control.  Low blood pressure (hypotension).  Low oxygen levels.  Medicines that reduce blood flow.  Infection. DIAGNOSIS  Your health care provider will diagnose your pressure ulcer based on its appearance. The health care provider may determine the stage of your pressure ulcer as well. Tests may be done to check for infection, to assess your circulation, or to check for other diseases, such as diabetes. TREATMENT  Treatment of your pressure ulcer begins with determining what stage the ulcer is in. Your treatment team may include your health care provider, a wound care specialist, a nutritionist, a physical therapist, and a Careers adviser. Possible treatments may include:   Moving or repositioning every 1-2 hours.  Using beds or mattresses to shift your body weight and pressure points frequently.  Improving your diet.  Cleaning and bandaging (dressing) the open wound.  Giving antibiotic medicines.  Removing damaged tissue.  Surgery and sometimes skin grafts. HOME CARE INSTRUCTIONS  If you were hospitalized, follow the care plan that was started in the hospital.  Avoid staying in the same position for more than 2 hours. Use padding, devices, or mattresses to cushion your pressure points as directed by your health care provider.  Eat a well-balanced diet. Take nutritional supplements and vitamins as directed by your health care provider.  Keep all follow-up appointments.  Only take over-the-counter or prescription medicines for pain, fever, or discomfort as directed by your health care provider. SEEK MEDICAL CARE IF:   Your pressure ulcer is not improving.  You do not know how to care for your pressure ulcer.  You notice other areas of redness on your skin.  You have a fever. SEEK IMMEDIATE  MEDICAL CARE IF:   You have increasing redness, swelling, or pain in your pressure ulcer.  You notice pus coming from your pressure ulcer.  You notice a bad smell coming from the wound or dressing.  Your pressure ulcer opens up again. Document Released: 12/06/2005 Document Revised: 12/11/2013 Document Reviewed: 08/13/2013 [facility] Patient Information 2015 [place], [place]. This information is not intended to replace advice given to you by your health care provider. Make sure you discuss any questions you have with your health care provider.

## 2015-06-05 NOTE — ED Notes (Signed)
Pt arrives via EMS from home. Pt was d/c yesterday from [facility] after admission for COPD exacerbation. Pt states he has become more short of breath today, has used multiple breathing treatments today.

## 2015-06-05 NOTE — Telephone Encounter (Signed)
Transitional Care Clinic Post-discharge Follow-Up Phone Call:  Date of Discharge: 06/03/15 Principal Discharge Diagnosis(es): Chest pain  Active problems:  Essential hypertension, CAD, COPD, uncontrolled type 2 DM, Lung abscess RUL  Call received back from patient who indicates "things are going downhill fast." Patient indicates "he is having to use too much oxygen to breathe."  Patient normally on home O2 at 3L, but indicates he is having to use 6-7L, and he is "still out of breath."  Inquired if patient using nebulizer and inhalers as prescribed, and he indicates he has without improvement. He indicates he is unable to sit on the side of his bed without shortness of breath.  Per patient's sister, patient picked up all discharge medications except NTG.  This Case Manager spoke with Dr. Hyman Hopes who indicates patient should go to ED immediately. This was discussed with patient who verbalized understanding and indicates he will call EMS and go to ED.

## 2015-06-05 NOTE — ED Provider Notes (Signed)
CSN: [id number]     Arrival date & time 06/05/15  1544 History   First MD Initiated Contact with Patient 06/05/15 1632     Chief Complaint  Patient presents with  . Shortness of Breath     (Consider location/radiation/quality/duration/timing/severity/associated sxs/prior Treatment) HPI Comments: Pt arrives via EMS from home. Pt was d/c yesterday from [facility] after admission for COPD exacerbation. Pt states he has become more short of breath today, has used multiple breathing treatments today. He states his home health nurse called to set up appt time for her first visit and when he reported he was SOB, he was told to call 911. He had a 5-10 min episode of SOB at rest around 11pm. None since. +subjective fever yesterday. He has been in the hospital multiple times this year for COPD exacerbations, NSTEMI, lung abscess.   Patient is a 63 y.o. male presenting with shortness of breath and fall. The history is provided by the patient. No language interpreter was used.  Shortness of Breath Severity:  Moderate Onset quality:  Gradual Duration:  2 days Timing:  Constant Progression:  Worsening Chronicity:  Chronic Context: activity   Relieved by:  Rest, oxygen and lying down Worsened by:  Movement Ineffective treatments:  Inhaler Associated symptoms: chest pain and cough   Associated symptoms: no abdominal pain, no diaphoresis, no fever, no headaches, no rash, no sputum production and no vomiting   Fall This is a new problem. The current episode started 3 to 5 hours ago. The problem occurs rarely. The problem has not changed since onset.Associated symptoms include chest pain and shortness of breath. Pertinent negatives include no abdominal pain and no headaches. Associated symptoms comments: Tailbone pain. Nothing (pressure to area) aggravates the symptoms. Nothing relieves the symptoms. He has tried nothing for the symptoms. The treatment provided no relief.    Past Medical History    Diagnosis Date  . COPD (chronic obstructive pulmonary disease)   . CAD (coronary artery disease)   . Hypertension   . Hyperlipidemia   . Myocardial infarction     "I've had a couple since 2006" (12/26/2013)  . Asthma   . GERD (gastroesophageal reflux disease)   . Stroke     "they say I've had a couple" denies residual on 12/26/2013  . Osteomyelitis hip "when I was young"    "right"  . CHF (congestive heart failure)   . Pneumonia     "have had it several times"  . OSA on CPAP     "wear mask q now and then" (05/24/2015)  . Type II diabetes mellitus   . History of blood transfusion "couple times"    "related to blood thinners"  . Chronic back pain   . Anxiety    Past Surgical History  Procedure Laterality Date  . Appendectomy  1963  . Cardiac catheterization  ? 2009; ?2012  . Coronary angioplasty with stent placement  2006; ?2008; ?2010; 01/2015    "2 + 2 + 1 + 1"  . Cardiac catheterization N/A 05/26/2015    Procedure: Left Heart Cath and Coronary Angiography;  Surgeon: Lennette Bihari, MD;  Location: [facility] INVASIVE CV LAB;  Service: Cardiovascular;  Laterality: N/A;   Family History  Problem Relation Age of Onset  . Coronary artery disease    . Hypertension     History  Substance Use Topics  . Smoking status: Former Smoker -- 0.25 packs/day for 51 years    Types: Cigarettes    Quit  date: 09/27/2014  . Smokeless tobacco: Never Used  . Alcohol Use: No    Review of Systems  Constitutional: Negative for fever, diaphoresis, activity change, appetite change and fatigue.  HENT: Negative for congestion, facial swelling, rhinorrhea and trouble swallowing.   Eyes: Negative for photophobia and pain.  Respiratory: Positive for cough and shortness of breath. Negative for sputum production and chest tightness.   Cardiovascular: Positive for chest pain. Negative for leg swelling.  Gastrointestinal: Negative for nausea, vomiting, abdominal pain, diarrhea and constipation.  Endocrine:  Negative for polydipsia and polyuria.  Genitourinary: Negative for dysuria, urgency, decreased urine volume and difficulty urinating.  Musculoskeletal: Negative for back pain and gait problem.  Skin: Negative for color change, rash and wound.  Allergic/Immunologic: Negative for immunocompromised state.  Neurological: Negative for dizziness, facial asymmetry, speech difficulty, weakness, numbness and headaches.  Psychiatric/Behavioral: Negative for confusion, decreased concentration and agitation.      Allergies  Lipitor  Home Medications   Prior to Admission medications   Medication Sig Start Date End Date Taking? Authorizing Provider  acetaminophen (TYLENOL) 325 MG tablet Take 2 tablets (650 mg total) by mouth every 6 (six) hours as needed for mild pain (or Fever >/= 101). 05/19/15  Yes Belkys A Regalado, MD  albuterol (PROVENTIL HFA;VENTOLIN HFA) 108 (90 BASE) MCG/ACT inhaler Inhale 2 puffs into the lungs every 4 (four) hours as needed for wheezing or shortness of breath. 05/19/15  Yes Belkys A Regalado, MD  albuterol (PROVENTIL) (2.5 MG/3ML) 0.083% nebulizer solution Take 3 mLs (2.5 mg total) by nebulization every 6 (six) hours as needed for wheezing or shortness of breath. Dx 496 11/04/14  Yes Shanker Levora Dredge, MD  aspirin 325 MG tablet Take 325 mg by mouth daily.   Yes Historical Provider, MD  clopidogrel (PLAVIX) 75 MG tablet Take 1 tablet (75 mg total) by mouth daily. 11/04/14  Yes Shanker Levora Dredge, MD  fentaNYL (DURAGESIC - DOSED MCG/HR) 12 MCG/HR Place 1 patch (12.5 mcg total) onto the skin every 3 (three) days. 05/19/15  Yes Belkys A Regalado, MD  fluticasone (FLONASE) 50 MCG/ACT nasal spray Place 2 sprays into both nostrils 2 (two) times daily. 11/04/14  Yes Shanker Levora Dredge, MD  furosemide (LASIX) 20 MG tablet Take 3 tablets (60 mg total) by mouth 2 (two) times daily. Patient taking differently: Take 20 mg by mouth 3 (three) times daily.  06/03/15  Yes Richarda Overlie, MD   guaiFENesin (ROBITUSSIN) 100 MG/5ML SOLN Take 10 mLs (200 mg total) by mouth every 6 (six) hours. Patient taking differently: Take 10 mLs by mouth every 6 (six) hours as needed for cough or to loosen phlegm.  05/19/15  Yes Belkys A Regalado, MD  insulin glargine (LANTUS) 100 UNIT/ML injection Inject 0.1 mLs (10 Units total) into the skin daily. 05/19/15  Yes Belkys A Regalado, MD  ipratropium (ATROVENT) 0.02 % nebulizer solution Take 2.5 mLs (0.5 mg total) by nebulization every 4 (four) hours as needed for wheezing or shortness of breath. 12/27/14  Yes Leslye Peer, MD  mometasone-formoterol (DULERA) 100-5 MCG/ACT AERO Inhale 2 puffs into the lungs 2 (two) times daily. 11/20/14  Yes Quentin Angst, MD  pantoprazole (PROTONIX) 40 MG tablet Take 1 tablet (40 mg total) by mouth 2 (two) times daily. Patient taking differently: Take 40 mg by mouth daily. Patient states it's the 40mg  tablet 05/19/15  Yes Belkys A Regalado, MD  potassium chloride SA (K-DUR,KLOR-CON) 20 MEQ tablet Take 1 tablet (20 mEq total) by mouth  daily. 11/04/14  Yes Shanker Levora Dredge, MD  predniSONE (DELTASONE) 10 MG tablet Take 5 tablets daily for 3 days then 3 tablet daily Patient taking differently: Take 10 mg by mouth See admin instructions. Take 5 tablets daily for 3 days then 3 tablet daily 05/19/15  Yes Belkys A Regalado, MD  traMADol (ULTRAM) 50 MG tablet Take 2 tablets (100 mg total) by mouth every 6 (six) hours as needed for moderate pain. 05/19/15  Yes Belkys A Regalado, MD  ALPRAZolam (XANAX) 0.25 MG tablet Take 1 tablet (0.25 mg total) by mouth 2 (two) times daily as needed for anxiety. Patient not taking: Reported on 06/05/2015 06/03/15   Richarda Overlie, MD  aspirin 81 MG EC tablet Take 1 tablet (81 mg total) by mouth daily. Patient not taking: Reported on 06/05/2015 06/03/15   Richarda Overlie, MD  benzonatate (TESSALON) 200 MG capsule Take 1 capsule (200 mg total) by mouth 3 (three) times daily. Patient not taking: Reported on  05/23/2015 05/19/15   Belkys A Regalado, MD  bisoprolol (ZEBETA) 5 MG tablet Take 1 tablet (5 mg total) by mouth 2 (two) times daily. Patient not taking: Reported on 06/05/2015 06/03/15   Richarda Overlie, MD  HYDROcodone-acetaminophen (NORCO) 5-325 MG per tablet Take 1 tablet by mouth every 6 (six) hours as needed. 06/05/15   Toy Cookey, MD  nitroGLYCERIN (NITROGLYN) 2 % ointment Apply 0.5 inches topically every 6 (six) hours. Patient not taking: Reported on 06/05/2015 06/03/15   Richarda Overlie, MD  nystatin (MYCOSTATIN) 100000 UNIT/ML suspension Take 5 mLs (500,000 Units total) by mouth 4 (four) times daily. Patient not taking: Reported on 06/05/2015 01/09/14   Belkys A Regalado, MD  pravastatin (PRAVACHOL) 20 MG tablet Take 1 tablet (20 mg total) by mouth daily at 6 PM. Patient not taking: Reported on 06/05/2015 06/03/15   Richarda Overlie, MD  sulfamethoxazole-trimethoprim (BACTRIM DS,SEPTRA DS) 800-160 MG per tablet Take 1 tablet by mouth every 12 (twelve) hours. Patient not taking: Reported on 06/05/2015 06/03/15   Richarda Overlie, MD   BP 124/78 mmHg  Pulse 88  Temp(Src) 97.9 F (36.6 C) (Oral)  Resp 15  Ht 5\' 11"  (1.803 m)  Wt 182 lb (82.555 kg)  BMI 25.40 kg/m2  SpO2 98% Physical Exam  Constitutional: He is oriented to person, place, and time. He appears well-developed and well-nourished. He appears ill. No distress.  HENT:  Head: Normocephalic and atraumatic.  Mouth/Throat: No oropharyngeal exudate.  Eyes: Pupils are equal, round, and reactive to light.  Neck: Normal range of motion. Neck supple.  Cardiovascular: Normal rate, regular rhythm and normal heart sounds.  Exam reveals no gallop and no friction rub.   No murmur heard. Pulmonary/Chest: Effort normal. No respiratory distress. He has decreased breath sounds in the right upper field, the right middle field, the right lower field, the left upper field, the left middle field and the left lower field. He has no wheezes. He has no rales.   Abdominal: Soft. Bowel sounds are normal. He exhibits no distension and no mass. There is no tenderness. There is no rebound and no guarding.  Musculoskeletal: Normal range of motion. He exhibits no edema or tenderness.       Back:  Neurological: He is alert and oriented to person, place, and time.  Skin: Skin is warm and dry.  Psychiatric: He has a normal mood and affect.    ED Course  Procedures (including critical care time) Labs Review Labs Reviewed  BASIC METABOLIC PANEL - Abnormal;  Notable for the following:    Sodium 133 (*)    Chloride 90 (*)    Glucose, Bld 218 (*)    BUN 27 (*)    Calcium 8.6 (*)    All other components within normal limits  CBC - Abnormal; Notable for the following:    WBC 13.6 (*)    RBC 3.50 (*)    Hemoglobin 9.8 (*)    HCT 31.2 (*)    RDW 16.6 (*)    All other components within normal limits  I-STAT TROPOININ, ED  Rosezena Sensor, ED    Imaging Review Dg Chest 2 View  06/05/2015   CLINICAL DATA:  Increasing shortness of Breath  EXAM: CHEST - 2 VIEW  COMPARISON:  05/27/2015  FINDINGS: Cardiac shadow is within normal limits. The known cavitary lesion in the right upper lobe is again identified and appears relatively stable from previous CT examination. An air-fluid level is noted within. No sizable infiltrate or effusion is noted. No bony abnormality is seen.  IMPRESSION: Stable appearance of the right upper lobe cavitary lesion. No acute abnormality is noted.   Electronically Signed   By: Alcide Clever M.D.   On: 06/05/2015 17:39   Dg Sacrum/coccyx  06/05/2015   CLINICAL DATA:  64 year old male with history of weakness. Fall onto the buttock region today complaining of pain in the tailbone region.  EXAM: SACRUM AND COCCYX - 2+ VIEW  COMPARISON:  04/22/2015.  PICC screw  FINDINGS: There is no evidence of fracture or other focal bone lesions. 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  IMPRESSION: 1. No acute abnormality of the sacrum or coccyx.   Electronically Signed   By: Trudie Reed M.D.   On: 06/05/2015 17:38     EKG Interpretation   Date/Time:  Thursday June 05 2015 16:01:37 EDT Ventricular Rate:  102 PR Interval:  152 QRS Duration: 94 QT Interval:  333 QTC Calculation: 434 R Axis:   115 Text Interpretation:  Sinus tachycardia Probable left atrial enlargement  Right axis deviation Borderline repolarization abnormality Confirmed by  DOCHERTY  MD, MEGAN ([id number]) on 06/05/2015 4:34:02 PM      LHC 05/26/15:   Suezanne Jacquet 1st Mrg to 1st Mrg lesion, 20% stenosed.  1st Mrg lesion, 30% stenosed.  Mid LAD lesion, 30% stenosed.  Low normal LV function with an ejection fraction of 50-55% and mild inferior hypocontractility.  Nonobstructive evidence for coronary calcification and 30% narrowing proximal LAD septal perforating artery before the diagonal vessel; widely patent tandem stents in the circumflex marginal vessel with 30% narrowing beyond the stented segment prior to the OM bifurcation; and widely patent stent in the proximal dominant RCA.  RECOMMENDATION:  Medical therapy.  MDM   Final diagnoses:  Chronic obstructive pulmonary disease with acute exacerbation  Coccyx contusion, initial encounter  Decubitus skin ulcer    Pt is a 64 y.o. male with Pmhx as above including end-stage COPD who presents with continued SOB since d/c 2 days ago after admission for COPD exacerbaiton, lung abscess, NSTEMI. Pt told to come by home health nurse after reporting to her over the phone he was SOB, did not actually see the pt first. On PE< pt chronically ill appearing, but in NAD> 96-100% on hom e3L, breaht sounds decreased thoroughout. + sacral tenderness over stage 2 decub, reports mechanical fall onto tailbone this morning.  Patient seems more concerned about obtaining pain medication for his sacral  pain and back pain and discussing his breathing.  He does not  appear to be in any respiratory distress.  His EKG shows a mild sinus tachycardia with T-wave inversions in the precordial leads which are unchanged from prior. He is taking his home meds, but is not sure if he is supposed to be on an antibiotic (should be on day 8/10 per d/c summary of PO bactrium). .   Troponin is not elevated.  Chest x-rays, no acute findings.  CBC and BMP are essentially stable.  X-ray of sacrum coccyx is normal.  Patient feeling somewhat improved after DuoNeb and pain medicine.  I do not feel he has indication for repeat admission.  He has home health set up will be given care with the transitional care team.  His COPD will likely continue to worsen as long as he is still smoking.  I've encouraged him to quit.  I've also encouraged him to change positions slowly.    Haig Prophet evaluation in the Emergency Department is complete. It has been determined that no acute conditions requiring further emergency intervention are present at this time. The patient/guardian have been advised of the diagnosis and plan. We have discussed signs and symptoms that warrant return to the ED, such as changes or worsening in symptoms, worsening pain, fever, inability to tolerate liquids    Toy Cookey, MD 06/05/15 2012

## 2015-06-05 NOTE — Telephone Encounter (Signed)
atc [facility], their office is currently closed.  wcb in a.m.

## 2015-06-05 NOTE — Telephone Encounter (Signed)
Patient is scheduled to see primary care physician tomorrow 06/06/15 and I will defer the orders to them unless they are unable or unwilling to complete

## 2015-06-05 NOTE — Telephone Encounter (Signed)
Transitional Care Clinic Post-discharge Follow-Up Phone Call:  Date of Discharge: 06/03/15 Principal Discharge Diagnosis(es): Chest pain Active problems: Essential hypertension, COPD, CHF, Uncontrolled type II diabetes mellitus with PVD, Lung Abscess RUL, NSTEMI Post-discharge Communication: Attempted once again to try to reach patient but phone not accepting incoming calls.  Call once again placed to patient's sister, Verlon Au, who indicated patient napping.  Requested patient return phone call. Interpreter Needed: No

## 2015-06-06 ENCOUNTER — Inpatient Hospital Stay: Payer: Medicare Other | Admitting: Family Medicine

## 2015-06-06 ENCOUNTER — Telehealth: Payer: Self-pay

## 2015-06-06 NOTE — Telephone Encounter (Signed)
Call placed to patient to again discuss transportation needs. Patient indicates he will need assist getting into wheelchair but indicates his daughter will be able to provide transportation for him if appointment on 06/11/15.  Patient appointment changed to 06/11/15 at 1430. Patient agreeable.

## 2015-06-06 NOTE — Telephone Encounter (Signed)
Spoke with [name] at [facility], she is aware that PCP needs to manage pt's home health.  Nothing further needed.

## 2015-06-06 NOTE — Telephone Encounter (Signed)
Transitional Care Clinic Post-discharge Follow-Up Phone Call:  Date of Discharge: 06/03/15, ED visit 06/05/15 Principal Discharge Diagnosis(es): Chest pain Active problems: Essential hypertension, CAD, COPD, uncontrolled type 2 DM, Lung abscess RUL Post-Discharge Communication: Patient    Please check all that apply:  X  Patient is knowledgeable of his/her condition(s) and/or treatment. ? Patient is caring for self at home.  X  Patient is receiving assist at home from family and/or caregiver. Patient currently living with his sister and receiving assist with ADLs. X  Patient is receiving home health services. Patient asked if home care services with [facility] has started, and he indicates they have not.  Call placed to [facility] with [place] [phone] who indicates she needs physician who will sign off on home health orders in order for services to begin. Spoke with Dr. Venetia Night who gave verbal order to sign home health orders.  Rosa indicates they will try to initiate [facility] SN/PT/OT/SW services over the weekend for patient now that verbal order obtained.     Medication Reconciliation:  X Medication list reviewed with patient. X  Patient has not obtained all discharge medications. Patient indicates he has obtained all discharge medications except nitroglycerin ointment because pharmacy did not have medication in stock.  Inquired if pharmacy is ordering medication, and he indicates he is unsure but he will call today and ask if it can be ordered. Patient indicates he needs to obtain medication as soon as possible.   Activities of Daily Living:  ? Independent X  Needs assist-Patient needing assist with ADLs-wheelchair bound at this point. Patient's sister indicates patient needs assist with transfers; however, patient indicates he can get into wheelchair himself. Patient living with sister so she can assist patient with activities of daily living.  Patient will benefit from  [facility] PT/OT due to deconditioning.  HH SN/PT/OT/SW services arranged with [facility]osa at [place] working on staffing and thinking services will begin over weekend. Patient also indicates he has a home nebulizer, is on home oxygen at 3L continuously, has shower chair. ? [facility]   Community resources in place for patient:  ? None  X  Home Health/Home DME-HH SN/PT/OT/SW services arranged with [facility]. ? Assisted Living ? Support Group       Questions/Concerns discussed: Patient indicates he "feels much better" after going to ED yesterday.  Pain better controlled, breathing improved. Patient and patient's sister indicate patient unable to come to appointment today and would like to reschedule for 06/09/15. Appointment reschedule for 06/09/15 at 1045.  Patient indicates wheelchair Zenaida Niece transportation needed. Patient indicates he thinks will be able to get into wheelchair, but will need a wheelchair van for transportation to appointment.  Spoke with Engineer, manufacturing who indicates she will try to work on transportation arrangements. Patient informed this CM will call him back if able to arrange transportation to appointment. Patient verbalized understanding.

## 2015-06-07 ENCOUNTER — Inpatient Hospital Stay (HOSPITAL_COMMUNITY)
Admission: EM | Admit: 2015-06-07 | Discharge: 2015-06-12 | DRG: 177 | Disposition: A | Discharge status: Home Health | Payer: Medicare Other | Attending: Internal Medicine | Admitting: Internal Medicine

## 2015-06-07 ENCOUNTER — Encounter (HOSPITAL_COMMUNITY): Payer: Self-pay | Admitting: Emergency Medicine

## 2015-06-07 ENCOUNTER — Emergency Department (HOSPITAL_COMMUNITY): Payer: Medicare Other

## 2015-06-07 ENCOUNTER — Inpatient Hospital Stay (HOSPITAL_COMMUNITY): Payer: Medicare Other

## 2015-06-07 DIAGNOSIS — I252 Old myocardial infarction: Secondary | ICD-10-CM | POA: Diagnosis not present

## 2015-06-07 DIAGNOSIS — Z8673 Personal history of transient ischemic attack (TIA), and cerebral infarction without residual deficits: Secondary | ICD-10-CM | POA: Diagnosis not present

## 2015-06-07 DIAGNOSIS — M549 Dorsalgia, unspecified: Secondary | ICD-10-CM | POA: Diagnosis present

## 2015-06-07 DIAGNOSIS — R0602 Shortness of breath: Secondary | ICD-10-CM | POA: Diagnosis present

## 2015-06-07 DIAGNOSIS — Z955 Presence of coronary angioplasty implant and graft: Secondary | ICD-10-CM | POA: Diagnosis not present

## 2015-06-07 DIAGNOSIS — IMO0002 Reserved for concepts with insufficient information to code with codable children: Secondary | ICD-10-CM | POA: Diagnosis present

## 2015-06-07 DIAGNOSIS — Z66 Do not resuscitate: Secondary | ICD-10-CM | POA: Diagnosis present

## 2015-06-07 DIAGNOSIS — J9621 Acute and chronic respiratory failure with hypoxia: Secondary | ICD-10-CM | POA: Diagnosis not present

## 2015-06-07 DIAGNOSIS — E1151 Type 2 diabetes mellitus with diabetic peripheral angiopathy without gangrene: Secondary | ICD-10-CM | POA: Diagnosis present

## 2015-06-07 DIAGNOSIS — B9562 Methicillin resistant Staphylococcus aureus infection as the cause of diseases classified elsewhere: Secondary | ICD-10-CM | POA: Diagnosis present

## 2015-06-07 DIAGNOSIS — J15212 Pneumonia due to Methicillin resistant Staphylococcus aureus: Secondary | ICD-10-CM | POA: Diagnosis present

## 2015-06-07 DIAGNOSIS — E1165 Type 2 diabetes mellitus with hyperglycemia: Secondary | ICD-10-CM

## 2015-06-07 DIAGNOSIS — J852 Abscess of lung without pneumonia: Secondary | ICD-10-CM

## 2015-06-07 DIAGNOSIS — Z7902 Long term (current) use of antithrombotics/antiplatelets: Secondary | ICD-10-CM | POA: Diagnosis not present

## 2015-06-07 DIAGNOSIS — Z7982 Long term (current) use of aspirin: Secondary | ICD-10-CM | POA: Diagnosis not present

## 2015-06-07 DIAGNOSIS — J851 Abscess of lung with pneumonia: Secondary | ICD-10-CM | POA: Diagnosis present

## 2015-06-07 DIAGNOSIS — G4733 Obstructive sleep apnea (adult) (pediatric): Secondary | ICD-10-CM | POA: Diagnosis present

## 2015-06-07 DIAGNOSIS — Z515 Encounter for palliative care: Secondary | ICD-10-CM | POA: Diagnosis not present

## 2015-06-07 DIAGNOSIS — Z794 Long term (current) use of insulin: Secondary | ICD-10-CM | POA: Diagnosis not present

## 2015-06-07 DIAGNOSIS — I5033 Acute on chronic diastolic (congestive) heart failure: Secondary | ICD-10-CM | POA: Diagnosis not present

## 2015-06-07 DIAGNOSIS — E1159 Type 2 diabetes mellitus with other circulatory complications: Secondary | ICD-10-CM | POA: Diagnosis not present

## 2015-06-07 DIAGNOSIS — Z87891 Personal history of nicotine dependence: Secondary | ICD-10-CM | POA: Diagnosis not present

## 2015-06-07 DIAGNOSIS — K219 Gastro-esophageal reflux disease without esophagitis: Secondary | ICD-10-CM | POA: Diagnosis present

## 2015-06-07 DIAGNOSIS — J9612 Chronic respiratory failure with hypercapnia: Secondary | ICD-10-CM | POA: Diagnosis present

## 2015-06-07 DIAGNOSIS — J449 Chronic obstructive pulmonary disease, unspecified: Secondary | ICD-10-CM | POA: Diagnosis present

## 2015-06-07 DIAGNOSIS — I10 Essential (primary) hypertension: Secondary | ICD-10-CM | POA: Diagnosis present

## 2015-06-07 DIAGNOSIS — Z7952 Long term (current) use of systemic steroids: Secondary | ICD-10-CM | POA: Diagnosis not present

## 2015-06-07 DIAGNOSIS — I2510 Atherosclerotic heart disease of native coronary artery without angina pectoris: Secondary | ICD-10-CM | POA: Diagnosis present

## 2015-06-07 DIAGNOSIS — K0889 Other specified disorders of teeth and supporting structures: Secondary | ICD-10-CM

## 2015-06-07 DIAGNOSIS — J441 Chronic obstructive pulmonary disease with (acute) exacerbation: Secondary | ICD-10-CM | POA: Diagnosis present

## 2015-06-07 DIAGNOSIS — I5032 Chronic diastolic (congestive) heart failure: Secondary | ICD-10-CM | POA: Diagnosis present

## 2015-06-07 DIAGNOSIS — J438 Other emphysema: Secondary | ICD-10-CM | POA: Diagnosis not present

## 2015-06-07 DIAGNOSIS — F419 Anxiety disorder, unspecified: Secondary | ICD-10-CM | POA: Diagnosis present

## 2015-06-07 DIAGNOSIS — E785 Hyperlipidemia, unspecified: Secondary | ICD-10-CM | POA: Diagnosis present

## 2015-06-07 DIAGNOSIS — G8929 Other chronic pain: Secondary | ICD-10-CM | POA: Diagnosis present

## 2015-06-07 DIAGNOSIS — R627 Adult failure to thrive: Secondary | ICD-10-CM | POA: Diagnosis present

## 2015-06-07 LAB — CBC WITH DIFFERENTIAL/PLATELET
BASOS ABS: 0 10*3/uL (ref 0.0–0.1)
Basophils Relative: 0 % (ref 0–1)
EOS ABS: 0 10*3/uL (ref 0.0–0.7)
Eosinophils Relative: 0 % (ref 0–5)
HEMATOCRIT: 34.4 % — AB (ref 39.0–52.0)
Hemoglobin: 11.1 g/dL — ABNORMAL LOW (ref 13.0–17.0)
LYMPHS PCT: 6 % — AB (ref 12–46)
Lymphs Abs: 0.7 10*3/uL (ref 0.7–4.0)
MCH: 28.6 pg (ref 26.0–34.0)
MCHC: 32.3 g/dL (ref 30.0–36.0)
MCV: 88.7 fL (ref 78.0–100.0)
MONOS PCT: 4 % (ref 3–12)
Monocytes Absolute: 0.4 10*3/uL (ref 0.1–1.0)
NEUTROS ABS: 10.1 10*3/uL — AB (ref 1.7–7.7)
Neutrophils Relative %: 90 % — ABNORMAL HIGH (ref 43–77)
PLATELETS: 181 10*3/uL (ref 150–400)
RBC: 3.88 MIL/uL — ABNORMAL LOW (ref 4.22–5.81)
RDW: 16.9 % — ABNORMAL HIGH (ref 11.5–15.5)
WBC: 11.2 10*3/uL — ABNORMAL HIGH (ref 4.0–10.5)

## 2015-06-07 LAB — COMPREHENSIVE METABOLIC PANEL
ALT: 34 U/L (ref 17–63)
ANION GAP: 13 (ref 5–15)
AST: 26 U/L (ref 15–41)
Albumin: 3.3 g/dL — ABNORMAL LOW (ref 3.5–5.0)
Alkaline Phosphatase: 104 U/L (ref 38–126)
BILIRUBIN TOTAL: 0.7 mg/dL (ref 0.3–1.2)
BUN: 17 mg/dL (ref 6–20)
CHLORIDE: 87 mmol/L — AB (ref 101–111)
CO2: 35 mmol/L — AB (ref 22–32)
CREATININE: 1 mg/dL (ref 0.61–1.24)
Calcium: 9.1 mg/dL (ref 8.9–10.3)
GFR calc Af Amer: >60 mL/min (ref >60)
Glucose, Bld: 140 mg/dL — ABNORMAL HIGH (ref 65–99)
Potassium: 3.5 mmol/L (ref 3.5–5.1)
Sodium: 135 mmol/L (ref 135–145)
Total Protein: 6.6 g/dL (ref 6.5–8.1)

## 2015-06-07 LAB — GLUCOSE, CAPILLARY
Glucose-Capillary: 171 mg/dL — ABNORMAL HIGH (ref 65–99)
Glucose-Capillary: 183 mg/dL — ABNORMAL HIGH (ref 65–99)

## 2015-06-07 LAB — BLOOD GAS, ARTERIAL
ACID-BASE EXCESS: 4.2 mmol/L — AB (ref 0.0–2.0)
Bicarbonate: 29.5 mEq/L — ABNORMAL HIGH (ref 20.0–24.0)
DRAWN BY: 28338
FIO2: 0.45 %
O2 Saturation: 98.1 %
PATIENT TEMPERATURE: 98.6
PCO2 ART: 54.6 mmHg — AB (ref 35.0–45.0)
TCO2: 31.1 mmol/L (ref 0–100)
pH, Arterial: 7.351 (ref 7.350–7.450)
pO2, Arterial: 118 mmHg — ABNORMAL HIGH (ref 80.0–100.0)

## 2015-06-07 LAB — BRAIN NATRIURETIC PEPTIDE: B NATRIURETIC PEPTIDE 5: 102.5 pg/mL — AB (ref 0.0–100.0)

## 2015-06-07 LAB — TROPONIN I
Troponin I: 0.06 ng/mL — ABNORMAL HIGH (ref <0.031)
Troponin I: 0.07 ng/mL — ABNORMAL HIGH (ref <0.031)
Troponin I: 0.06 ng/mL — ABNORMAL HIGH (ref <0.031)

## 2015-06-07 LAB — MRSA PCR SCREENING: MRSA by PCR: NEGATIVE

## 2015-06-07 LAB — I-STAT CG4 LACTIC ACID, ED
Lactic Acid, Venous: 1.51 mmol/L (ref 0.5–2.0)
Lactic Acid, Venous: 0.86 mmol/L (ref 0.5–2.0)

## 2015-06-07 MED ORDER — IOHEXOL 300 MG/ML  SOLN
75.0000 mL | Freq: Once | INTRAMUSCULAR | Status: AC | PRN
  Administered 2015-06-07: 75 mL via INTRAVENOUS

## 2015-06-07 MED ORDER — IPRATROPIUM-ALBUTEROL 0.5-2.5 (3) MG/3ML IN SOLN
3.0000 mL | RESPIRATORY_TRACT | Status: DC
  Administered 2015-06-07: 3 mL via RESPIRATORY_TRACT

## 2015-06-07 MED ORDER — ONDANSETRON HCL 4 MG/2ML IJ SOLN: 4.0000 mg | Freq: Four times a day (QID) | INTRAMUSCULAR | Status: DC | PRN

## 2015-06-07 MED ORDER — ACETAMINOPHEN 325 MG PO TABS
650.0000 mg | ORAL_TABLET | Freq: Once | ORAL | Status: AC
  Administered 2015-06-07: 650 mg via ORAL
  Filled 2015-06-07: qty 2

## 2015-06-07 MED ORDER — LORAZEPAM 2 MG/ML IJ SOLN
0.5000 mg | Freq: Once | INTRAMUSCULAR | Status: AC
  Administered 2015-06-07: 0.5 mg via INTRAVENOUS
  Filled 2015-06-07: qty 1

## 2015-06-07 MED ORDER — FLUTICASONE PROPIONATE 50 MCG/ACT NA SUSP
2.0000 | Freq: Two times a day (BID) | NASAL | Status: DC
  Administered 2015-06-07 – 2015-06-12 (×8): 2 via NASAL
  Filled 2015-06-07: qty 16

## 2015-06-07 MED ORDER — ASPIRIN 325 MG PO TABS: 325.0000 mg | ORAL_TABLET | Freq: Every day | ORAL | Status: DC

## 2015-06-07 MED ORDER — IPRATROPIUM-ALBUTEROL 0.5-2.5 (3) MG/3ML IN SOLN
3.0000 mL | RESPIRATORY_TRACT | Status: DC
  Administered 2015-06-07 – 2015-06-12 (×29): 3 mL via RESPIRATORY_TRACT
  Filled 2015-06-07 (×18): qty 3
  Filled 2015-06-07: qty 6
  Filled 2015-06-07 (×12): qty 3

## 2015-06-07 MED ORDER — OXYCODONE-ACETAMINOPHEN 5-325 MG PO TABS
1.0000 | ORAL_TABLET | Freq: Once | ORAL | Status: AC
  Administered 2015-06-07: 1 via ORAL
  Filled 2015-06-07: qty 1

## 2015-06-07 MED ORDER — DEXTROSE 5 % IV SOLN
2.0000 g | Freq: Three times a day (TID) | INTRAVENOUS | Status: DC
  Administered 2015-06-07 – 2015-06-12 (×15): 2 g via INTRAVENOUS
  Filled 2015-06-07 (×17): qty 2

## 2015-06-07 MED ORDER — ALBUTEROL (5 MG/ML) CONTINUOUS INHALATION SOLN
10.0000 mg/h | INHALATION_SOLUTION | Freq: Once | RESPIRATORY_TRACT | Status: AC
  Administered 2015-06-07: 10 mg/h via RESPIRATORY_TRACT

## 2015-06-07 MED ORDER — ENOXAPARIN SODIUM 40 MG/0.4ML ~~LOC~~ SOLN
40.0000 mg | SUBCUTANEOUS | Status: DC
  Administered 2015-06-07 – 2015-06-11 (×5): 40 mg via SUBCUTANEOUS
  Filled 2015-06-07 (×6): qty 0.4

## 2015-06-07 MED ORDER — METHYLPREDNISOLONE SODIUM SUCC 125 MG IJ SOLR
80.0000 mg | Freq: Two times a day (BID) | INTRAMUSCULAR | Status: DC
  Administered 2015-06-08 – 2015-06-09 (×3): 80 mg via INTRAVENOUS
  Filled 2015-06-07: qty 2
  Filled 2015-06-07 (×4): qty 1.28

## 2015-06-07 MED ORDER — HYDROCODONE-ACETAMINOPHEN 5-325 MG PO TABS
1.0000 | ORAL_TABLET | Freq: Four times a day (QID) | ORAL | Status: DC | PRN
  Administered 2015-06-07 – 2015-06-12 (×16): 1 via ORAL
  Filled 2015-06-07 (×16): qty 1

## 2015-06-07 MED ORDER — MOMETASONE FURO-FORMOTEROL FUM 100-5 MCG/ACT IN AERO
2.0000 | INHALATION_SPRAY | Freq: Two times a day (BID) | RESPIRATORY_TRACT | Status: DC
  Administered 2015-06-07 – 2015-06-12 (×10): 2 via RESPIRATORY_TRACT
  Filled 2015-06-07: qty 8.8

## 2015-06-07 MED ORDER — FENTANYL 12 MCG/HR TD PT72: 12.5000 ug | MEDICATED_PATCH | TRANSDERMAL | Status: DC

## 2015-06-07 MED ORDER — POTASSIUM CHLORIDE CRYS ER 20 MEQ PO TBCR
20.0000 meq | EXTENDED_RELEASE_TABLET | Freq: Every day | ORAL | Status: DC
  Administered 2015-06-07 – 2015-06-12 (×6): 20 meq via ORAL
  Filled 2015-06-07 (×6): qty 1

## 2015-06-07 MED ORDER — BISOPROLOL FUMARATE 5 MG PO TABS
5.0000 mg | ORAL_TABLET | Freq: Two times a day (BID) | ORAL | Status: DC
  Administered 2015-06-07 – 2015-06-12 (×10): 5 mg via ORAL
  Filled 2015-06-07 (×11): qty 1

## 2015-06-07 MED ORDER — IPRATROPIUM BROMIDE 0.02 % IN SOLN
0.5000 mg | Freq: Once | RESPIRATORY_TRACT | Status: AC
  Administered 2015-06-07: 0.5 mg via RESPIRATORY_TRACT
  Filled 2015-06-07: qty 2.5

## 2015-06-07 MED ORDER — BENZONATATE 100 MG PO CAPS
200.0000 mg | ORAL_CAPSULE | Freq: Three times a day (TID) | ORAL | Status: DC
Start: 2015-06-07 — End: 2015-06-07

## 2015-06-07 MED ORDER — ASPIRIN 325 MG PO TABS
325.0000 mg | ORAL_TABLET | Freq: Every day | ORAL | Status: DC
  Administered 2015-06-07 – 2015-06-12 (×6): 325 mg via ORAL
  Filled 2015-06-07 (×7): qty 1

## 2015-06-07 MED ORDER — ALPRAZOLAM 0.25 MG PO TABS
0.2500 mg | ORAL_TABLET | Freq: Two times a day (BID) | ORAL | Status: DC | PRN
  Administered 2015-06-07 – 2015-06-12 (×7): 0.25 mg via ORAL
  Filled 2015-06-07 (×8): qty 1

## 2015-06-07 MED ORDER — METHYLPREDNISOLONE SODIUM SUCC 125 MG IJ SOLR
125.0000 mg | Freq: Once | INTRAMUSCULAR | Status: AC
  Administered 2015-06-07: 125 mg via INTRAVENOUS
  Filled 2015-06-07: qty 2

## 2015-06-07 MED ORDER — PRAVASTATIN SODIUM 20 MG PO TABS
20.0000 mg | ORAL_TABLET | Freq: Every day | ORAL | Status: DC
  Administered 2015-06-08 – 2015-06-11 (×4): 20 mg via ORAL
  Filled 2015-06-07 (×5): qty 1

## 2015-06-07 MED ORDER — PANTOPRAZOLE SODIUM 40 MG PO TBEC
40.0000 mg | DELAYED_RELEASE_TABLET | Freq: Every day | ORAL | Status: DC
  Administered 2015-06-07 – 2015-06-12 (×6): 40 mg via ORAL
  Filled 2015-06-07 (×8): qty 1

## 2015-06-07 MED ORDER — TRAMADOL HCL 50 MG PO TABS
100.0000 mg | ORAL_TABLET | Freq: Four times a day (QID) | ORAL | Status: DC | PRN
  Administered 2015-06-08 – 2015-06-12 (×10): 100 mg via ORAL
  Filled 2015-06-07 (×10): qty 2

## 2015-06-07 MED ORDER — ONDANSETRON HCL 4 MG PO TABS: 4.0000 mg | ORAL_TABLET | Freq: Four times a day (QID) | ORAL | Status: DC | PRN

## 2015-06-07 MED ORDER — ALBUTEROL SULFATE (2.5 MG/3ML) 0.083% IN NEBU
10.0000 mg | INHALATION_SOLUTION | Freq: Once | RESPIRATORY_TRACT | Status: DC
  Filled 2015-06-07: qty 12

## 2015-06-07 MED ORDER — CLOPIDOGREL BISULFATE 75 MG PO TABS
75.0000 mg | ORAL_TABLET | Freq: Every day | ORAL | Status: DC
  Administered 2015-06-08 – 2015-06-12 (×5): 75 mg via ORAL
  Filled 2015-06-07 (×5): qty 1

## 2015-06-07 MED ORDER — INSULIN ASPART 100 UNIT/ML ~~LOC~~ SOLN
0.0000 [IU] | Freq: Three times a day (TID) | SUBCUTANEOUS | Status: DC
  Administered 2015-06-07: 3 [IU] via SUBCUTANEOUS
  Administered 2015-06-08 (×2): 2 [IU] via SUBCUTANEOUS
  Administered 2015-06-08: 3 [IU] via SUBCUTANEOUS
  Administered 2015-06-09 (×2): 5 [IU] via SUBCUTANEOUS

## 2015-06-07 MED ORDER — MORPHINE SULFATE 4 MG/ML IJ SOLN
4.0000 mg | Freq: Once | INTRAMUSCULAR | Status: AC
  Administered 2015-06-07: 4 mg via INTRAVENOUS
  Filled 2015-06-07: qty 1

## 2015-06-07 MED ORDER — ALBUTEROL SULFATE (2.5 MG/3ML) 0.083% IN NEBU
2.5000 mg | INHALATION_SOLUTION | RESPIRATORY_TRACT | Status: DC | PRN
  Filled 2015-06-07: qty 3

## 2015-06-07 MED ORDER — ACETAMINOPHEN 325 MG PO TABS
650.0000 mg | ORAL_TABLET | Freq: Four times a day (QID) | ORAL | Status: DC | PRN
  Administered 2015-06-07: 650 mg via ORAL
  Filled 2015-06-07: qty 2

## 2015-06-07 MED ORDER — FUROSEMIDE 40 MG PO TABS
60.0000 mg | ORAL_TABLET | Freq: Two times a day (BID) | ORAL | Status: DC
  Administered 2015-06-07 – 2015-06-12 (×10): 60 mg via ORAL
  Filled 2015-06-07 (×13): qty 1

## 2015-06-07 MED ORDER — ASPIRIN 81 MG PO TBEC: 81.0000 mg | DELAYED_RELEASE_TABLET | Freq: Every day | ORAL | Status: DC

## 2015-06-07 MED ORDER — GUAIFENESIN ER 600 MG PO TB12
600.0000 mg | ORAL_TABLET | Freq: Two times a day (BID) | ORAL | Status: DC
  Administered 2015-06-08 – 2015-06-12 (×9): 600 mg via ORAL
  Filled 2015-06-07 (×11): qty 1

## 2015-06-07 MED ORDER — VANCOMYCIN HCL IN DEXTROSE 1-5 GM/200ML-% IV SOLN
1000.0000 mg | Freq: Two times a day (BID) | INTRAVENOUS | Status: DC
  Administered 2015-06-07 – 2015-06-12 (×10): 1000 mg via INTRAVENOUS
  Filled 2015-06-07 (×12): qty 200

## 2015-06-07 MED ORDER — SODIUM CHLORIDE 0.9 % IV BOLUS (SEPSIS)
1500.0000 mL | Freq: Once | INTRAVENOUS | Status: AC
  Administered 2015-06-07: 1500 mL via INTRAVENOUS

## 2015-06-07 MED ORDER — INSULIN GLARGINE 100 UNIT/ML ~~LOC~~ SOLN: 10.0000 [IU] | Freq: Every day | SUBCUTANEOUS | Status: DC

## 2015-06-07 NOTE — ED Notes (Signed)
Pt brought back from CT and staff reports that pt was not able to lay down or tolerate the CT scan. Pt appears anxious. O2 sats 97%. Dr. Patria Mane informed.

## 2015-06-07 NOTE — ED Notes (Signed)
Admitting MD at bedside.

## 2015-06-07 NOTE — Progress Notes (Signed)
06/07/2015 Patent transfer from the ED to 2 central at 1832. He is alert, oriented and weak. Patient got to unit he was on a venti Mask at 45  satting at 100%. Patient was placed on contact for MRSA. He have bruises on bilateral arms and bandaid with skintear on the left arm, bruises on chest and scabs. [facility] RN.

## 2015-06-07 NOTE — H&P (Addendum)
[facility] History and Physical  [name] ZOX:[id number] DOB: 21-Jan-1951 DOA: 06/07/2015  Referring physician: EDP PCP: Jeanann Lewandowsky, MD   Chief Complaint: Shortness of breath  HPI: [name] is a 64 y.o. male with end stage COPD, on 3-4 L home oxygen, MRSA lung abscess, history of stroke, chronic back pain, diabetes, diastolic CHF, CAD with PCI was just discharged from [facility] 4 days ago. He was treated for COPD exacerbation and MRSA lung abscess. For his MRSA lung abscess he was seen by CT surgery who felt that he was not a candidate for surgery due to his advanced COPD, he was evaluated by pulmonary for percutaneous drainage. Pulmonary did not feel that he was a good candidate for percutaneous drainage due to risk of fistula and empyema. He was treated with IV vancomycin in the hospital and discharged home on oral Bactrim with plan for close pulmonary follow-up. He has been miserable since discharge, was seen in the ER at [facility] long 2 days after discharge for shortness of breath and discharged home after breathing treatments. Comes back to the ER today with worsening shortness of breath, cough productive of yellow sputum, subjective fevers and chills. He reports compliance with medications and oxygen. In the ER and noted to be tachypneic, given steroids and nebs, starting to feel little bit better  Review of Systems: Positives bolded Constitutional:  No weight loss, night sweats, Fevers, chills, fatigue.  HEENT:  No headaches, Difficulty swallowing,Tooth/dental problems,Sore throat,  No sneezing, itching, ear ache, nasal congestion, post nasal drip,  Cardio-vascular:  No chest pain, Orthopnea, PND, swelling in lower extremities, anasarca, dizziness, palpitations  GI:  No heartburn, indigestion, abdominal pain, nausea, vomiting, diarrhea, change in bowel habits, loss of appetite  Resp:  shortness of breath with exertion or at rest. No excess mucus,  productive cough, No non-productive cough, No coughing up of blood.No change in color of mucus.No wheezing.No chest wall deformity  Skin:  no rash or lesions.  GU:  no dysuria, change in color of urine, no urgency or frequency. No flank pain.  Musculoskeletal:  No joint pain or swelling. No decreased range of motion. No back pain.  Psych:  No change in mood or affect. No depression or anxiety. No memory loss.   Past Medical History  Diagnosis Date  . COPD (chronic obstructive pulmonary disease)   . CAD (coronary artery disease)   . Hypertension   . Hyperlipidemia   . Myocardial infarction     "I've had a couple since 2006" (12/26/2013)  . Asthma   . GERD (gastroesophageal reflux disease)   . Stroke     "they say I've had a couple" denies residual on 12/26/2013  . Osteomyelitis hip "when I was young"    "right"  . CHF (congestive heart failure)   . Pneumonia     "have had it several times"  . OSA on CPAP     "wear mask q now and then" (05/24/2015)  . Type II diabetes mellitus   . History of blood transfusion "couple times"    "related to blood thinners"  . Chronic back pain   . Anxiety    Past Surgical History  Procedure Laterality Date  . Appendectomy  1963  . Cardiac catheterization  ? 2009; ?2012  . Coronary angioplasty with stent placement  2006; ?2008; ?2010; 01/2015    "2 + 2 + 1 + 1"  . Cardiac catheterization N/A 05/26/2015    Procedure: Left Heart Cath  and Coronary Angiography;  Surgeon: Lennette Bihari, MD;  Location: [facility] INVASIVE CV LAB;  Service: Cardiovascular;  Laterality: N/A;   Social History:  reports that he quit smoking about 8 months ago. His smoking use included Cigarettes. He has a 12.75 pack-year smoking history. He has never used smokeless tobacco. He reports that he does not drink alcohol or use illicit drugs.  Allergies  Allergen Reactions  . Lipitor [Atorvastatin Calcium] Other (See Comments)    Muscle aches    Family History  Problem Relation Age  of Onset  . Coronary artery disease    . Hypertension      Prior to Admission medications   Medication Sig Start Date End Date Taking? Authorizing Provider  acetaminophen (TYLENOL) 325 MG tablet Take 2 tablets (650 mg total) by mouth every 6 (six) hours as needed for mild pain (or Fever >/= 101). 05/19/15   Belkys A Regalado, MD  albuterol (PROVENTIL HFA;VENTOLIN HFA) 108 (90 BASE) MCG/ACT inhaler Inhale 2 puffs into the lungs every 4 (four) hours as needed for wheezing or shortness of breath. 05/19/15   Belkys A Regalado, MD  albuterol (PROVENTIL) (2.5 MG/3ML) 0.083% nebulizer solution Take 3 mLs (2.5 mg total) by nebulization every 6 (six) hours as needed for wheezing or shortness of breath. Dx 496 11/04/14   Shanker Levora Dredge, MD  ALPRAZolam Prudy Feeler) 0.25 MG tablet Take 1 tablet (0.25 mg total) by mouth 2 (two) times daily as needed for anxiety. Patient not taking: Reported on 06/05/2015 06/03/15   Richarda Overlie, MD  aspirin 325 MG tablet Take 325 mg by mouth daily.    Historical Provider, MD  aspirin 81 MG EC tablet Take 1 tablet (81 mg total) by mouth daily. Patient not taking: Reported on 06/05/2015 06/03/15   Richarda Overlie, MD  benzonatate (TESSALON) 200 MG capsule Take 1 capsule (200 mg total) by mouth 3 (three) times daily. Patient not taking: Reported on 05/23/2015 05/19/15   Belkys A Regalado, MD  bisoprolol (ZEBETA) 5 MG tablet Take 1 tablet (5 mg total) by mouth 2 (two) times daily. Patient not taking: Reported on 06/05/2015 06/03/15   Richarda Overlie, MD  clopidogrel (PLAVIX) 75 MG tablet Take 1 tablet (75 mg total) by mouth daily. 11/04/14   Shanker Levora Dredge, MD  fentaNYL (DURAGESIC - DOSED MCG/HR) 12 MCG/HR Place 1 patch (12.5 mcg total) onto the skin every 3 (three) days. 05/19/15   Belkys A Regalado, MD  fluticasone (FLONASE) 50 MCG/ACT nasal spray Place 2 sprays into both nostrils 2 (two) times daily. 11/04/14   Shanker Levora Dredge, MD  furosemide (LASIX) 20 MG tablet Take 3 tablets (60 mg  total) by mouth 2 (two) times daily. Patient taking differently: Take 20 mg by mouth 3 (three) times daily.  06/03/15   Richarda Overlie, MD  guaiFENesin (ROBITUSSIN) 100 MG/5ML SOLN Take 10 mLs (200 mg total) by mouth every 6 (six) hours. Patient taking differently: Take 10 mLs by mouth every 6 (six) hours as needed for cough or to loosen phlegm.  05/19/15   Belkys A Regalado, MD  HYDROcodone-acetaminophen (NORCO) 5-325 MG per tablet Take 1 tablet by mouth every 6 (six) hours as needed. 06/05/15   Toy Cookey, MD  insulin glargine (LANTUS) 100 UNIT/ML injection Inject 0.1 mLs (10 Units total) into the skin daily. 05/19/15   Belkys A Regalado, MD  ipratropium (ATROVENT) 0.02 % nebulizer solution Take 2.5 mLs (0.5 mg total) by nebulization every 4 (four) hours as needed for wheezing or  shortness of breath. 12/27/14   Leslye Peer, MD  mometasone-formoterol (DULERA) 100-5 MCG/ACT AERO Inhale 2 puffs into the lungs 2 (two) times daily. 11/20/14   Quentin Angst, MD  nitroGLYCERIN (NITROGLYN) 2 % ointment Apply 0.5 inches topically every 6 (six) hours. Patient not taking: Reported on 06/05/2015 06/03/15   Richarda Overlie, MD  nystatin (MYCOSTATIN) 100000 UNIT/ML suspension Take 5 mLs (500,000 Units total) by mouth 4 (four) times daily. Patient not taking: Reported on 06/05/2015 01/09/14   Belkys A Regalado, MD  pantoprazole (PROTONIX) 40 MG tablet Take 1 tablet (40 mg total) by mouth 2 (two) times daily. Patient taking differently: Take 40 mg by mouth daily. Patient states it's the 40mg  tablet 05/19/15   Belkys A Regalado, MD  potassium chloride SA (K-DUR,KLOR-CON) 20 MEQ tablet Take 1 tablet (20 mEq total) by mouth daily. 11/04/14   Shanker Levora Dredge, MD  pravastatin (PRAVACHOL) 20 MG tablet Take 1 tablet (20 mg total) by mouth daily at 6 PM. Patient not taking: Reported on 06/05/2015 06/03/15   Richarda Overlie, MD  predniSONE (DELTASONE) 10 MG tablet Take 5 tablets daily for 3 days then 3 tablet daily Patient  taking differently: Take 10 mg by mouth See admin instructions. Take 5 tablets daily for 3 days then 3 tablet daily 05/19/15   Belkys A Regalado, MD  sulfamethoxazole-trimethoprim (BACTRIM DS,SEPTRA DS) 800-160 MG per tablet Take 1 tablet by mouth every 12 (twelve) hours. Patient not taking: Reported on 06/05/2015 06/03/15   Richarda Overlie, MD  traMADol (ULTRAM) 50 MG tablet Take 2 tablets (100 mg total) by mouth every 6 (six) hours as needed for moderate pain. 05/19/15   Alba Cory, MD   Physical Exam: Filed Vitals:   06/07/15 1515 06/07/15 1608 06/07/15 1615 06/07/15 1618  BP: 116/85  140/75   Pulse: 127  123   Temp:  100.4 F (38 C)  100.4 F (38 C)  TempSrc:  Rectal  Rectal  Resp: 16     SpO2: 100%  100%     Wt Readings from Last 3 Encounters:  06/05/15 82.555 kg (182 lb)  06/02/15 82.781 kg (182 lb 8 oz)  05/21/15 85.73 kg (189 lb)    General:  Chronically ill-appearing male, somewhat tachypneic, able to speak in full sentences, oriented to self place and person Eyes: PERRL, normal lids, irises & conjunctiva ENT: grossly normal hearing, lips & tongue Neck: no LAD, masses or thyromegaly Cardiovascular: RRR, no m/r/g. Trace edema. Telemetry: SR, no arrhythmias  Respiratory: Poor air movement in upper lobes, diffuse basilar rhonchi, few expiratory wheezes Abdomen: soft, ntnd Skin: no rash or induration seen on limited exam Musculoskeletal: grossly normal tone BUE/BLE Psychiatric: Unable to assess Neurologic: grossly non-focal.          Labs on Admission:  Basic Metabolic Panel:  Recent Labs Lab 06/02/15 1200 06/03/15 0630 06/05/15 1628 06/07/15 1309  NA 133* 134* 133* 135  K 4.4 4.1 4.1 3.5  CL 86* 86* 90* 87*  CO2 38* 38* 32 35*  GLUCOSE 211* 106* 218* 140*  BUN 16 15 27* 17  CREATININE 0.65 0.65 0.95 1.00  CALCIUM 9.0 8.8* 8.6* 9.1   Liver Function Tests:  Recent Labs Lab 06/02/15 1200 06/03/15 0630 06/07/15 1309  AST 19 17 26   ALT 25 26 34    ALKPHOS 112 111 104  BILITOT 0.4 0.3 0.7  PROT 5.5* 5.3* 6.6  ALBUMIN 2.7* 2.8* 3.3*   No results for input(s): LIPASE, AMYLASE in  the last 168 hours. No results for input(s): AMMONIA in the last 168 hours. CBC:  Recent Labs Lab 06/02/15 1200 06/05/15 1628 06/07/15 1309  WBC 11.4* 13.6* 11.2*  NEUTROABS  --   --  10.1*  HGB 9.9* 9.8* 11.1*  HCT 31.3* 31.2* 34.4*  MCV 87.7 89.1 88.7  PLT 215 189 181   Cardiac Enzymes:  Recent Labs Lab 06/07/15 1309  TROPONINI 0.06*    BNP (last 3 results)  Recent Labs  05/04/15 2318 05/24/15 0415 06/07/15 1309  BNP 136.0* 350.6* 102.5*    ProBNP (last 3 results)  Recent Labs  10/27/14 2311  PROBNP 89.0    CBG:  Recent Labs Lab 06/02/15 1624 06/02/15 2133 06/03/15 0734 06/03/15 1127 06/03/15 1631  GLUCAP 258* 140* 108* 208* 184*    Radiological Exams on Admission: Dg Chest 2 View  06/05/2015   CLINICAL DATA:  Increasing shortness of Breath  EXAM: CHEST - 2 VIEW  COMPARISON:  05/27/2015  FINDINGS: Cardiac shadow is within normal limits. The known cavitary lesion in the right upper lobe is again identified and appears relatively stable from previous CT examination. An air-fluid level is noted within. No sizable infiltrate or effusion is noted. No bony abnormality is seen.  IMPRESSION: Stable appearance of the right upper lobe cavitary lesion. No acute abnormality is noted.   Electronically Signed   By: Alcide Clever M.D.   On: 06/05/2015 17:39   Dg Sacrum/coccyx  06/05/2015   CLINICAL DATA:  64 year old male with history of weakness. Fall onto the buttock region today complaining of pain in the tailbone region.  EXAM: SACRUM AND COCCYX - 2+ VIEW  COMPARISON:  04/22/2015.  PICC screw  FINDINGS: There is no evidence of fracture or other focal bone lesions. Advanced degenerative changes of osteoarthritis are noted in the hip joints bilaterally (right much greater than left).  IMPRESSION: 1. No acute abnormality of the sacrum  or coccyx.   Electronically Signed   By: Trudie Reed M.D.   On: 06/05/2015 17:38   Dg Chest Portable 1 View  06/07/2015   CLINICAL DATA:  Shortness of breath.  EXAM: PORTABLE CHEST - 1 VIEW  COMPARISON:  June 05, 2015.  FINDINGS: The heart size and mediastinal contours are within normal limits. No pneumothorax or pleural effusion is noted. Left lung is clear. Continued presence of cavitary lesion is seen medially in right upper lobe. The visualized skeletal structures are unremarkable.  IMPRESSION: Continued presence of cavitary lesion seen medially in right upper lobe concerning for abscess. No significant change compared to prior exam.   Electronically Signed   By: Lupita Raider, M.D.   On: 06/07/2015 13:27    EKG: Independently reviewed. Sinus tachycardia, prolonged QTc interval, no acute T-wave changes  Assessment/Plan Active Problems:   Acute on chronic respiratory failure with hypoxia -Due to COPD exacerbation, chronic diastolic CHF and MRSA lung abscess -CT chest to evaluate for worsening of abscess -He was seen by CT surgery and not felt to be a candidate for surgery due to advanced COPD -Also followed by pulmonary and not felt to be a candidate for percutaneous drainage due to risk of fistula and empyema -Has been on oral Bactrim, changed to IV vancomycin at this time -Also add gram-negative coverage -Due to end-stage COPD, and ongoing failure to thrive, discussed poor prognosis with patient and recommended calling consult for goals of care -DNR now, BIpAP as needed -will check ABG    COPD exacerbation - antibiotic as  noted above -IV Solu-Medrol, DuoNeb's, Mucinex -BiPAP as needed,      CAD -dual antiplatelet therapy and statin, no on BB due to severe COPD -troponin mildly elevated, cardiology last week recommended medical management for this    Chronic diastolic CHF (congestive heart failure) - Continue Lasix    DM (diabetes mellitus) type II uncontrolled, periph  vascular disorder -Continue Lantus, and sliding-scale    MRSA pneumonia with RUL lung abscess -As dictated above   Global: Critically ill with end-stage COPD, MRSA lung abscess, CAD, adult failure to thrive Discussed poor prognosis with patient and recommended palliative consult for goals of care he is agreeable to DO NOT RESUSCITATE at this time  Code Status:DNR DVT Prophylaxis: lovenox Family Communication: none at bedside Disposition Plan: SDU  Time spent:  [facility] Pager [phone]

## 2015-06-07 NOTE — ED Notes (Addendum)
Per EMS, pt coming in today for SOB which started 2 days ago after a fall. Pt was evaluated after he fall at [facility] long. Pt reported that the sob increased today. Pt is on 3.5-5L O2 at home at all times.  Pt  Received 5mg  of albuterol in route. Diminished expiratory wheezing heard.  Pt alert x4. Pt has hx of COPD and CHF. HR: 141, BP:141/97, SpO2: 98% NRB

## 2015-06-07 NOTE — ED Notes (Addendum)
Patient c/o anxiety and sts unable to tolerate mask for albuterol treatment anymore and is very anxious. Pt requesting to stop and be placed back on [place] O2, RN unable to calm patient down so patient was placed on 5L O2 by [place] sats 88%. Jeff respiratory at bedside, pulse-ox moved to forehead reading 100%. Respiratory provided suctioning. Pt still anxious placed on venturi mask.

## 2015-06-07 NOTE — ED Notes (Signed)
Report attempted 

## 2015-06-07 NOTE — ED Provider Notes (Signed)
CSN: [id number]     Arrival date & time 06/07/15  1236 History   First MD Initiated Contact with Patient 06/07/15 1237     Chief Complaint  Patient presents with  . Shortness of Breath     The history is provided by the patient and medical records.   Patient has a history of severe end-stage COPD.  He also has a history of MRSA lung abscess.  He is being followed by pulmonary for this.  He is seen CT surgery in the hospital and they're not recommending surgery at this time.  He presents now with increasing worsening shortness of breath.  He was discharged from the hospital 4 days ago.  He was seen in the emergency department 2 days ago for increasing shortness of breath and discharged home after breathing treatments in the ER.  Patient now presents with a heart rate of 141 and stating severe shortness of breath.  He reports ongoing productive cough.  He denies unilateral leg swelling.  He states he was unable to tolerate his breathing treatment secondary to severe shortness of breath.   Past Medical History  Diagnosis Date  . COPD (chronic obstructive pulmonary disease)   . CAD (coronary artery disease)   . Hypertension   . Hyperlipidemia   . Myocardial infarction     "I've had a couple since 2006" (12/26/2013)  . Asthma   . GERD (gastroesophageal reflux disease)   . Stroke     "they say I've had a couple" denies residual on 12/26/2013  . Osteomyelitis hip "when I was young"    "right"  . CHF (congestive heart failure)   . Pneumonia     "have had it several times"  . OSA on CPAP     "wear mask q now and then" (05/24/2015)  . Type II diabetes mellitus   . History of blood transfusion "couple times"    "related to blood thinners"  . Chronic back pain   . Anxiety    Past Surgical History  Procedure Laterality Date  . Appendectomy  1963  . Cardiac catheterization  ? 2009; ?2012  . Coronary angioplasty with stent placement  2006; ?2008; ?2010; 01/2015    "2 + 2 + 1 + 1"  . Cardiac  catheterization N/A 05/26/2015    Procedure: Left Heart Cath and Coronary Angiography;  Surgeon: Lennette Bihari, MD;  Location: [facility] INVASIVE CV LAB;  Service: Cardiovascular;  Laterality: N/A;   Family History  Problem Relation Age of Onset  . Coronary artery disease    . Hypertension     History  Substance Use Topics  . Smoking status: Former Smoker -- 0.25 packs/day for 51 years    Types: Cigarettes    Quit date: 09/27/2014  . Smokeless tobacco: Never Used  . Alcohol Use: No    Review of Systems  All other systems reviewed and are negative.     Allergies  Lipitor  Home Medications   Prior to Admission medications   Medication Sig Start Date End Date Taking? Authorizing Provider  acetaminophen (TYLENOL) 325 MG tablet Take 2 tablets (650 mg total) by mouth every 6 (six) hours as needed for mild pain (or Fever >/= 101). 05/19/15   Belkys A Regalado, MD  albuterol (PROVENTIL HFA;VENTOLIN HFA) 108 (90 BASE) MCG/ACT inhaler Inhale 2 puffs into the lungs every 4 (four) hours as needed for wheezing or shortness of breath. 05/19/15   Belkys A Regalado, MD  albuterol (PROVENTIL) (2.5 MG/3ML) 0.083%  nebulizer solution Take 3 mLs (2.5 mg total) by nebulization every 6 (six) hours as needed for wheezing or shortness of breath. Dx 496 11/04/14   Shanker Levora Dredge, MD  ALPRAZolam Prudy Feeler) 0.25 MG tablet Take 1 tablet (0.25 mg total) by mouth 2 (two) times daily as needed for anxiety. Patient not taking: Reported on 06/05/2015 06/03/15   Richarda Overlie, MD  aspirin 325 MG tablet Take 325 mg by mouth daily.    Historical Provider, MD  aspirin 81 MG EC tablet Take 1 tablet (81 mg total) by mouth daily. Patient not taking: Reported on 06/05/2015 06/03/15   Richarda Overlie, MD  benzonatate (TESSALON) 200 MG capsule Take 1 capsule (200 mg total) by mouth 3 (three) times daily. Patient not taking: Reported on 05/23/2015 05/19/15   Belkys A Regalado, MD  bisoprolol (ZEBETA) 5 MG tablet Take 1 tablet (5 mg total) by  mouth 2 (two) times daily. Patient not taking: Reported on 06/05/2015 06/03/15   Richarda Overlie, MD  clopidogrel (PLAVIX) 75 MG tablet Take 1 tablet (75 mg total) by mouth daily. 11/04/14   Shanker Levora Dredge, MD  fentaNYL (DURAGESIC - DOSED MCG/HR) 12 MCG/HR Place 1 patch (12.5 mcg total) onto the skin every 3 (three) days. 05/19/15   Belkys A Regalado, MD  fluticasone (FLONASE) 50 MCG/ACT nasal spray Place 2 sprays into both nostrils 2 (two) times daily. 11/04/14   Shanker Levora Dredge, MD  furosemide (LASIX) 20 MG tablet Take 3 tablets (60 mg total) by mouth 2 (two) times daily. Patient taking differently: Take 20 mg by mouth 3 (three) times daily.  06/03/15   Richarda Overlie, MD  guaiFENesin (ROBITUSSIN) 100 MG/5ML SOLN Take 10 mLs (200 mg total) by mouth every 6 (six) hours. Patient taking differently: Take 10 mLs by mouth every 6 (six) hours as needed for cough or to loosen phlegm.  05/19/15   Belkys A Regalado, MD  HYDROcodone-acetaminophen (NORCO) 5-325 MG per tablet Take 1 tablet by mouth every 6 (six) hours as needed. 06/05/15   Toy Cookey, MD  insulin glargine (LANTUS) 100 UNIT/ML injection Inject 0.1 mLs (10 Units total) into the skin daily. 05/19/15   Belkys A Regalado, MD  ipratropium (ATROVENT) 0.02 % nebulizer solution Take 2.5 mLs (0.5 mg total) by nebulization every 4 (four) hours as needed for wheezing or shortness of breath. 12/27/14   Leslye Peer, MD  mometasone-formoterol (DULERA) 100-5 MCG/ACT AERO Inhale 2 puffs into the lungs 2 (two) times daily. 11/20/14   Quentin Angst, MD  nitroGLYCERIN (NITROGLYN) 2 % ointment Apply 0.5 inches topically every 6 (six) hours. Patient not taking: Reported on 06/05/2015 06/03/15   Richarda Overlie, MD  nystatin (MYCOSTATIN) 100000 UNIT/ML suspension Take 5 mLs (500,000 Units total) by mouth 4 (four) times daily. Patient not taking: Reported on 06/05/2015 01/09/14   Belkys A Regalado, MD  pantoprazole (PROTONIX) 40 MG tablet Take 1 tablet (40 mg total) by  mouth 2 (two) times daily. Patient taking differently: Take 40 mg by mouth daily. Patient states it's the  tablet 05/19/15   Belkys A Regalado, MD  potassium chloride SA (K-DUR,KLOR-CON) 20 MEQ tablet Take 1 tablet (20 mEq total) by mouth daily. 11/04/14   Shanker Levora Dredge, MD  pravastatin (PRAVACHOL) 20 MG tablet Take 1 tablet (20 mg total) by mouth daily at 6 PM. Patient not taking: Reported on 06/05/2015 06/03/15   Richarda Overlie, MD  predniSONE (DELTASONE) 10 MG tablet Take 5 tablets daily for 3 days then 3  tablet daily Patient taking differently: Take 10 mg by mouth See admin instructions. Take 5 tablets daily for 3 days then 3 tablet daily 05/19/15   Belkys A Regalado, MD  sulfamethoxazole-trimethoprim (BACTRIM DS,SEPTRA DS) 800-160 MG per tablet Take 1 tablet by mouth every 12 (twelve) hours. Patient not taking: Reported on 06/05/2015 06/03/15   Richarda Overlie, MD  traMADol (ULTRAM) 50 MG tablet Take 2 tablets (100 mg total) by mouth every 6 (six) hours as needed for moderate pain. 05/19/15   Belkys A Regalado, MD   BP 151/66 mmHg  Pulse 96  Temp(Src) 99.8 F (37.7 C) (Oral)  Resp 18  SpO2 100% Physical Exam  Constitutional: He is oriented to person, place, and time. He appears well-developed and well-nourished.  HENT:  Head: Normocephalic and atraumatic.  Eyes: EOM are normal.  Neck: Normal range of motion.  Cardiovascular: Regular rhythm, normal heart sounds and intact distal pulses.   Tachycardia  Pulmonary/Chest:  Tachypnea with accessory muscle use.  Rhonchi bilaterally  Abdominal: Soft. He exhibits no distension. There is no tenderness.  Musculoskeletal: Normal range of motion.  Neurological: He is alert and oriented to person, place, and time.  Skin: Skin is warm and dry.  Psychiatric: He has a normal mood and affect. Judgment normal.  Nursing note and vitals reviewed.   ED Course  Procedures (including critical care time) Labs Review Labs Reviewed  CBC WITH  DIFFERENTIAL/PLATELET - Abnormal; Notable for the following:    WBC 11.2 (*)    RBC 3.88 (*)    Hemoglobin 11.1 (*)    HCT 34.4 (*)    RDW 16.9 (*)    Neutrophils Relative % 90 (*)    Lymphocytes Relative 6 (*)    Neutro Abs 10.1 (*)    All other components within normal limits  COMPREHENSIVE METABOLIC PANEL - Abnormal; Notable for the following:    Chloride 87 (*)    CO2 35 (*)    Glucose, Bld 140 (*)    Albumin 3.3 (*)    All other components within normal limits  BRAIN NATRIURETIC PEPTIDE - Abnormal; Notable for the following:    B Natriuretic Peptide 102.5 (*)    All other components within normal limits  TROPONIN I - Abnormal; Notable for the following:    Troponin I 0.06 (*)    All other components within normal limits  CULTURE, BLOOD (ROUTINE X 2)  CULTURE, BLOOD (ROUTINE X 2)  I-STAT CG4 LACTIC ACID, ED    Imaging Review Dg Chest 2 View  06/05/2015   CLINICAL DATA:  Increasing shortness of Breath  EXAM: CHEST - 2 VIEW  COMPARISON:  05/27/2015  FINDINGS: Cardiac shadow is within normal limits. The known cavitary lesion in the right upper lobe is again identified and appears relatively stable from previous CT examination. An air-fluid level is noted within. No sizable infiltrate or effusion is noted. No bony abnormality is seen.  IMPRESSION: Stable appearance of the right upper lobe cavitary lesion. No acute abnormality is noted.   Electronically Signed   By: Alcide Clever M.D.   On: 06/05/2015 17:39   Dg Sacrum/coccyx  06/05/2015   CLINICAL DATA:  64 year old male with history of weakness. Fall onto the buttock region today complaining of pain in the tailbone region.  EXAM: SACRUM AND COCCYX - 2+ VIEW  COMPARISON:  04/22/2015.  PICC screw  FINDINGS: There is no evidence of fracture or other focal bone lesions. 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  IMPRESSION: 1. No acute abnormality of the sacrum  or coccyx.   Electronically Signed   By: Trudie Reed M.D.   On: 06/05/2015 17:38   Dg Chest Portable 1 View  06/07/2015   CLINICAL DATA:  Shortness of breath.  EXAM: PORTABLE CHEST - 1 VIEW  COMPARISON:  June 05, 2015.  FINDINGS: The heart size and mediastinal contours are within normal limits. No pneumothorax or pleural effusion is noted. Left lung is clear. Continued presence of cavitary lesion is seen medially in right upper lobe. The visualized skeletal structures are unremarkable.  IMPRESSION: Continued presence of cavitary lesion seen medially in right upper lobe concerning for abscess. No significant change compared to prior exam.   Electronically Signed   By: Lupita Raider, M.D.   On: 06/07/2015 13:27  I personally reviewed the imaging tests through PACS system I reviewed available ER/hospitalization records through the EMR    EKG Interpretation   Date/Time:  Saturday June 07 2015 12:43:31 EDT Ventricular Rate:  139 PR Interval:  123 QRS Duration: 106 QT Interval:  390 QTC Calculation: 593 R Axis:   176 Text Interpretation:  Sinus tachycardia Right axis deviation Repol abnrm  suggests ischemia, diffuse leads Prolonged QT interval Baseline wander in  lead(s) V3 No significant change was found Confirmed by Choice Kleinsasser  MD, Dabney Schanz  ([id number]) on 06/07/2015 3:21:44 PM      MDM   Final diagnoses:  COPD exacerbation  Lung abscess    Concerning for severe COPD exacerbation.  Patient was given several breathing treatments in emergency department.  He states this is helping somewhat.  He'll be given IV Solu-Medrol here in the emergency department.  He does have an oral temp of 99.8.  I'm concerned about the possibility of either incomplete treatment of his right lung abscess and/or worsening.  Patient will undergo CT imaging at this time to further evaluate progression of his lung abscess.  Patient will need to be again admitted to the hospital for COPD exacerbation.  It sounds as though  strong consideration for percutaneous drainage of this abscess needs to be considered as he does not seem to be improving with conservative treatment with broad-spectrum antibiotics.  Patient will be admitted to the stepdown unit.    Azalia Bilis, MD 06/07/15 [phone]

## 2015-06-07 NOTE — ED Notes (Signed)
Pt being transferred to CT scan and then plan to be transferred upstairs to inpatient room.

## 2015-06-07 NOTE — ED Notes (Signed)
Pt brought over for CT chest.  Moved pt onto scan table and pt immediately requested we sit him up, stated he couldn't breath.  Assisted patient to a seated position and gave him time to try and catch his breath.  Pt stated his breathing was worse and he could not tolerate laying flat.  Pt moved back onto stretcher, returned to the ED by Josue Hector tech.  Kim notified patient's RN.

## 2015-06-07 NOTE — Progress Notes (Signed)
ANTIBIOTIC CONSULT NOTE - INITIAL  Pharmacy Consult for Vancomycin and Ceftazidime Indication: rule out pneumonia  Allergies  Allergen Reactions  . Lipitor [Atorvastatin Calcium] Other (See Comments)    Muscle aches    Patient Measurements:   Adjusted Body Weight:   Vital Signs: Temp: 100.4 F (38 C) (06/18 1618) Temp Source: Rectal (06/18 1618) BP: 140/75 mmHg (06/18 1615) Pulse Rate: 123 (06/18 1615) Intake/Output from previous day:   Intake/Output from this shift: Total I/O In: 1500 [I.V.:1500] Out: -   Labs:  Recent Labs  06/05/15 1628 06/07/15 1309  WBC 13.6* 11.2*  HGB 9.8* 11.1*  PLT 189 181  CREATININE 0.95 1.00   Estimated Creatinine Clearance: 78.4 mL/min (by C-G formula based on Cr of 1). No results for input(s): VANCOTROUGH, VANCOPEAK, VANCORANDOM, GENTTROUGH, GENTPEAK, GENTRANDOM, TOBRATROUGH, TOBRAPEAK, TOBRARND, AMIKACINPEAK, AMIKACINTROU, AMIKACIN in the last 72 hours.   Microbiology: Recent Results (from the past 720 hour(s))  Culture, blood (routine x 2) Call MD if unable to obtain prior to antibiotics being given     Status: None   Collection Time: 05/12/15  7:04 AM  Result Value Ref Range Status   Specimen Description BLOOD RIGHT ARM  Final   Special Requests BOTTLES DRAWN AEROBIC ONLY 5CC  Final   Culture   Final    NO GROWTH 5 DAYS Note: Culture results may be compromised due to an inadequate volume of blood received in culture bottles. Performed at [facility]    Report Status 05/18/2015 FINAL  Final  Culture, blood (routine x 2) Call MD if unable to obtain prior to antibiotics being given     Status: None   Collection Time: 05/12/15  7:14 AM  Result Value Ref Range Status   Specimen Description BLOOD RIGHT HAND  Final   Special Requests BOTTLES DRAWN AEROBIC ONLY 1CC  Final   Culture   Final    NO GROWTH 5 DAYS Performed at [facility]    Report Status 05/18/2015 FINAL  Final  MRSA PCR Screening     Status:  Abnormal   Collection Time: 05/12/15  2:10 PM  Result Value Ref Range Status   MRSA by PCR POSITIVE (A) NEGATIVE Final    Comment:        The GeneXpert MRSA Assay (FDA approved for NASAL specimens only), is one component of a comprehensive MRSA colonization surveillance program. It is not intended to diagnose MRSA infection nor to guide or monitor treatment for MRSA infections. RESULT CALLED TO, READ BACK BY AND VERIFIED WITH: Liz Beach RN 15:55 05/12/15 ([email or web address])   Culture, sputum-assessment     Status: None   Collection Time: 05/17/15  5:10 AM  Result Value Ref Range Status   Specimen Description SPUTUM  Final   Special Requests NONE  Final   Sputum evaluation   Final    THIS SPECIMEN IS ACCEPTABLE. RESPIRATORY CULTURE REPORT TO FOLLOW.   Report Status 05/17/2015 FINAL  Final  Culture, respiratory (NON-Expectorated)     Status: None   Collection Time: 05/17/15  5:10 AM  Result Value Ref Range Status   Specimen Description SPUTUM  Final   Special Requests NONE  Final   Gram Stain   Final    ABUNDANT WBC PRESENT,BOTH PMN AND MONONUCLEAR RARE SQUAMOUS EPITHELIAL CELLS PRESENT FEW GRAM POSITIVE COCCI IN PAIRS IN CLUSTERS Gram Stain Report Called to,Read Back By and Verified With: Gram Stain Report Called to,Read Back By and Verified With: RICKY.O AT 0657 ON [id number] BY  WEBSP Performed at [facility]   Final    MODERATE METHICILLIN RESISTANT STAPHYLOCOCCUS AUREUS Note: RIFAMPIN AND GENTAMICIN SHOULD NOT BE USED AS SINGLE DRUGS FOR TREATMENT OF STAPH INFECTIONS. Performed at [facility]    Report Status 05/19/2015 FINAL  Final   Organism ID, Bacteria METHICILLIN RESISTANT STAPHYLOCOCCUS AUREUS  Final      Susceptibility   Methicillin resistant staphylococcus aureus - MIC*    CLINDAMYCIN >=8 RESISTANT Resistant     ERYTHROMYCIN >=8 RESISTANT Resistant     GENTAMICIN <=0.5 SENSITIVE Sensitive     LEVOFLOXACIN >=8 RESISTANT Resistant      OXACILLIN >=4 RESISTANT Resistant     PENICILLIN >=0.5 RESISTANT Resistant     RIFAMPIN <=0.5 SENSITIVE Sensitive     TRIMETH/SULFA <=10 SENSITIVE Sensitive     VANCOMYCIN <=0.5 SENSITIVE Sensitive     TETRACYCLINE <=1 SENSITIVE Sensitive     * MODERATE METHICILLIN RESISTANT STAPHYLOCOCCUS AUREUS  Blood Culture (routine x 2)     Status: None   Collection Time: 05/23/15  5:57 PM  Result Value Ref Range Status   Specimen Description BLOOD RIGHT ARM  Final   Special Requests BOTTLES DRAWN AEROBIC AND ANAEROBIC 5CC  Final   Culture   Final    NO GROWTH 5 DAYS Performed at [facility]    Report Status 05/30/2015 FINAL  Final  Blood Culture (routine x 2)     Status: None   Collection Time: 05/23/15  6:10 PM  Result Value Ref Range Status   Specimen Description BLOOD RIGHT ARM  Final   Special Requests BOTTLES DRAWN AEROBIC AND ANAEROBIC 5CC  Final   Culture   Final    NO GROWTH 5 DAYS Performed at [facility]    Report Status 05/30/2015 FINAL  Final  Urine culture     Status: None   Collection Time: 05/23/15  6:55 PM  Result Value Ref Range Status   Specimen Description URINE, RANDOM  Final   Special Requests NONE  Final   Colony Count NO GROWTH Performed at [facility]   Final   Culture NO GROWTH Performed at [facility]   Final   Report Status 05/24/2015 FINAL  Final  Culture, sputum-assessment     Status: None   Collection Time: 05/27/15 12:09 PM  Result Value Ref Range Status   Specimen Description SPU  Final   Special Requests NONE  Final   Sputum evaluation   Final    THIS SPECIMEN IS ACCEPTABLE. RESPIRATORY CULTURE REPORT TO FOLLOW.   Report Status 05/27/2015 FINAL  Final  Culture, respiratory (NON-Expectorated)     Status: None   Collection Time: 05/27/15 12:09 PM  Result Value Ref Range Status   Specimen Description SPUTUM  Final   Special Requests NONE  Final   Gram Stain   Final    FEW WBC PRESENT,BOTH PMN AND  MONONUCLEAR NO SQUAMOUS EPITHELIAL CELLS SEEN FEW GRAM POSITIVE COCCI IN PAIRS IN CLUSTERS Performed at [facility]    Culture   Final    ABUNDANT METHICILLIN RESISTANT STAPHYLOCOCCUS AUREUS Note: RIFAMPIN AND GENTAMICIN SHOULD NOT BE USED AS SINGLE DRUGS FOR TREATMENT OF STAPH INFECTIONS. CRITICAL RESULT CALLED TO, READ BACK BY AND VERIFIED WITH: LATAVIA H 6/9  BY REAMM Performed at [facility]    Report Status 05/29/2015 FINAL  Final   Organism ID, Bacteria METHICILLIN RESISTANT STAPHYLOCOCCUS AUREUS  Final      Susceptibility  Methicillin resistant staphylococcus aureus - MIC*    CLINDAMYCIN >=8 RESISTANT Resistant     ERYTHROMYCIN >=8 RESISTANT Resistant     GENTAMICIN <=0.5 SENSITIVE Sensitive     LEVOFLOXACIN >=8 RESISTANT Resistant     OXACILLIN >=4 RESISTANT Resistant     PENICILLIN >=0.5 RESISTANT Resistant     RIFAMPIN <=0.5 SENSITIVE Sensitive     TRIMETH/SULFA <=10 SENSITIVE Sensitive     VANCOMYCIN <=0.5 SENSITIVE Sensitive     TETRACYCLINE <=1 SENSITIVE Sensitive     * ABUNDANT METHICILLIN RESISTANT STAPHYLOCOCCUS AUREUS  Blood culture (routine x 2)     Status: None (Preliminary result)   Collection Time: 06/07/15  1:05 PM  Result Value Ref Range Status   Specimen Description BLOOD LEFT ANTECUBITAL  Final   Special Requests BOTTLES DRAWN AEROBIC AND ANAEROBIC 5CC  Final   Culture PENDING  Incomplete   Report Status PENDING  Incomplete    Medical History: Past Medical History  Diagnosis Date  . COPD (chronic obstructive pulmonary disease)   . CAD (coronary artery disease)   . Hypertension   . Hyperlipidemia   . Myocardial infarction     "I've had a couple since 2006" (12/26/2013)  . Asthma   . GERD (gastroesophageal reflux disease)   . Stroke     "they say I've had a couple" denies residual on 12/26/2013  . Osteomyelitis hip "when I was young"    "right"  . CHF (congestive heart failure)   . Pneumonia     "have had it several times"   . OSA on CPAP     "wear mask q now and then" (05/24/2015)  . Type II diabetes mellitus   . History of blood transfusion "couple times"    "related to blood thinners"  . Chronic back pain   . Anxiety     Medications:   (Not in a hospital admission) Scheduled:  . guaiFENesin  600 mg Oral BID  . ipratropium-albuterol  3 mL Nebulization Q4H  . ipratropium-albuterol  3 mL Nebulization Q4H  . methylPREDNISolone (SOLU-MEDROL) injection  80 mg Intravenous Q12H   Infusions:     Assessment: 64yo male with history of COPD, DM2, and chronic respiratory failure with hypercapnia presents with acute on chronic resp failure. Pharmacy is consulted to dose ceftazidime and vancomycin for suspected HCAP. Pt is febrile to 100.4, WBC 11.2, sCr 1.  Goal of Therapy:  Vancomycin trough level 15-20 mcg/ml  Plan:  Vancomycin 1g IV q12h Ceftazidime 2g IV q8h Expected duration 7 days with resolution of temperature and/or normalization of WBC Measure antibiotic drug levels at steady state Follow up culture results, renal function, and clinical course  Arlean Hopping. Newman Pies, PharmD Clinical Pharmacist Pager [phone] Delle Reining M 06/07/2015,4:38 PM

## 2015-06-08 ENCOUNTER — Inpatient Hospital Stay (HOSPITAL_COMMUNITY): Payer: Medicare Other

## 2015-06-08 DIAGNOSIS — E1159 Type 2 diabetes mellitus with other circulatory complications: Secondary | ICD-10-CM

## 2015-06-08 DIAGNOSIS — J15212 Pneumonia due to Methicillin resistant Staphylococcus aureus: Principal | ICD-10-CM

## 2015-06-08 DIAGNOSIS — Z515 Encounter for palliative care: Secondary | ICD-10-CM

## 2015-06-08 DIAGNOSIS — I5033 Acute on chronic diastolic (congestive) heart failure: Secondary | ICD-10-CM

## 2015-06-08 DIAGNOSIS — J438 Other emphysema: Secondary | ICD-10-CM

## 2015-06-08 LAB — GLUCOSE, CAPILLARY
GLUCOSE-CAPILLARY: 140 mg/dL — AB (ref 65–99)
GLUCOSE-CAPILLARY: 159 mg/dL — AB (ref 65–99)
GLUCOSE-CAPILLARY: 163 mg/dL — AB (ref 65–99)
Glucose-Capillary: 146 mg/dL — ABNORMAL HIGH (ref 65–99)
Glucose-Capillary: 153 mg/dL — ABNORMAL HIGH (ref 65–99)
Glucose-Capillary: 294 mg/dL — ABNORMAL HIGH (ref 65–99)

## 2015-06-08 MED ORDER — CETYLPYRIDINIUM CHLORIDE 0.05 % MT LIQD
7.0000 mL | Freq: Two times a day (BID) | OROMUCOSAL | Status: DC
  Administered 2015-06-08 – 2015-06-12 (×8): 7 mL via OROMUCOSAL

## 2015-06-08 MED ORDER — SENNOSIDES-DOCUSATE SODIUM 8.6-50 MG PO TABS
1.0000 | ORAL_TABLET | Freq: Every evening | ORAL | Status: DC | PRN
  Filled 2015-06-08: qty 1

## 2015-06-08 MED ORDER — MORPHINE SULFATE 2 MG/ML IJ SOLN
2.0000 mg | INTRAMUSCULAR | Status: DC | PRN
  Administered 2015-06-10 – 2015-06-12 (×4): 2 mg via INTRAVENOUS
  Filled 2015-06-08 (×4): qty 1

## 2015-06-08 NOTE — Consult Note (Signed)
Consultation Note Date: 06/08/2015   Patient Name: [name]  DOB: 05-02-1951  MRN: [id number]  Age / Sex: 64 y.o., male   PCP: Quentin Angst, MD Referring Physician: Joseph Art, DO  Reason for Consultation: Establishing goals of care  Palliative Care Assessment and Plan Summary of Established Goals of Care and Medical Treatment Preferences   Clinical Assessment/Narrative:   [name] is a 64 y.o. male with end stage COPD, on 3-4 L home oxygen, MRSA lung abscess, history of stroke, chronic back pain, diabetes, diastolic CHF, CAD with PCI was just discharged from [facility] 5 days ago. He was treated for COPD exacerbation and MRSA lung abscess. For his MRSA lung abscess he was seen by CT surgery who felt that he was not a candidate for surgery due to his advanced COPD, he was evaluated by pulmonary for percutaneous drainage. Pulmonary did not feel that he was a good candidate for percutaneous drainage due to risk of fistula and empyema. He was treated with IV vancomycin in the hospital and discharged home on oral Bactrim with plan for close pulmonary follow-up. Patient continued to have dyspnea and episodic chest pain, was seen in the ER at [facility] long 2 days after discharge for shortness of breath and discharged home after breathing treatments. Comes back to the ER on 06-07-15 with worsening shortness of breath, cough productive of yellow sputum, subjective fevers and chills.  Patient has been admitted for acute on chronic respiratory failure with hypoxia -Due to COPD exacerbation, chronic diastolic CHF and MRSA lung abscess. He has been placed on broad spectrum IV antibiotics, attempts at diuresis are being made.   Palliative consult placed for goals of care discussions.   Patient is resting in bed, has cough, states he feels congested, at times dyspneic even at rest, continues with episodic chest discomfort with cough. He is asking for IV Lasix today.    PLAN: 1. Continue current treatment, consider IV Diuresis 2. Goals as discussed with patient and then his sister ( primary caregiver over the phone):  Consider comfort measures only if patient has acute clinical deterioration in this hospitalization.   Also discussed possibility of adding Hospice services at home after discharge this time, if the patient is not a surgical candidate and if his lung abscess/ overall cardio pulmonary status does not reasonably stabilize.    Contacts/Participants in Discussion: Primary Decision Maker: patient, then his sister.    HCPOA: no   sister is noted to be primary caregiver. He has 3 daughters.   Code Status/Advance Care Planning:  DNR DNI.   Symptom Management:   Dyspnea: will add low dose IV Morphine PRN.   Palliative Prophylaxis: will ensure addition of bowel and anti emetic regimen.   Additional Recommendations (Limitations, Scope, Preferences):  Continue to observe trajectory.    Psycho-social/Spiritual:   Support System: sister   Desire for further Chaplaincy support: not today  Prognosis:  possibly weeks to some limited number of months if lung abscess does not resolve. has many other co morbid conditions.   Discharge Planning:  might have to consider home with hospice, depends on his clinical course, will follow trajectory and advise.    Domains of Care: - Physical:  Chest pain, dyspnea - Psychological: mood congruent, wants to get better - Social: lives with sister, separated from wife, has 3 daughters.  - Spiritual: no acute issues noted in initial encounter, not discussed in detail, patient with cough and dyspnea - Cultural: as  above - Imminently dying: no but prognosis appears guarded given patient's acute and chronic co morbidities - Ethical/Legal: DNR DNI  Values: to try to get better, today, to specifically focus on getting IV Lasix Life limiting illness: lung abscess in the setting of underlying COPD and CAD       Chief Complaint/History of Present Illness: dyspnea, pain  Primary Diagnoses  Present on Admission:  . COPD exacerbation . COPD (chronic obstructive pulmonary disease) . Acute on chronic respiratory failure with hypoxia . OSA (obstructive sleep apnea) . Acute on chronic diastolic CHF (congestive heart failure) . DM (diabetes mellitus) type II uncontrolled, periph vascular disorder . Chronic respiratory failure with hypercapnia . MRSA pneumonia with RUL lung abscess  Palliative Review of Systems: noted I have reviewed the medical record, interviewed the patient and family, and examined the patient. The following aspects are pertinent.  Past Medical History  Diagnosis Date  . COPD (chronic obstructive pulmonary disease)   . CAD (coronary artery disease)   . Hypertension   . Hyperlipidemia   . Myocardial infarction     "I've had a couple since 2006" (12/26/2013)  . Asthma   . GERD (gastroesophageal reflux disease)   . Stroke     "they say I've had a couple" denies residual on 12/26/2013  . Osteomyelitis hip "when I was young"    "right"  . CHF (congestive heart failure)   . Pneumonia     "have had it several times"  . OSA on CPAP     "wear mask q now and then" (05/24/2015)  . Type II diabetes mellitus   . History of blood transfusion "couple times"    "related to blood thinners"  . Chronic back pain   . Anxiety    History   Social History  . Marital Status: Married    Spouse Name: N/A  . Number of Children: N/A  . Years of Education: N/A   Occupational History  . Disabled    Social History Main Topics  . Smoking status: Former Smoker -- 0.25 packs/day for 51 years    Types: Cigarettes    Quit date: 09/27/2014  . Smokeless tobacco: Never Used  . Alcohol Use: No  . Drug Use: No  . Sexual Activity: Not Currently   Other Topics Concern  . None   Social History Narrative   Family History  Problem Relation Age of Onset  . Coronary artery disease    .  Hypertension     Scheduled Meds: . antiseptic oral rinse  7 mL Mouth Rinse BID  . aspirin  325 mg Oral Daily  . bisoprolol  5 mg Oral BID  . cefTAZidime (FORTAZ)  IV  2 g Intravenous Q8H  . clopidogrel  75 mg Oral Daily  . enoxaparin (LOVENOX) injection  40 mg Subcutaneous Q24H  . fluticasone  2 spray Each Nare BID  . furosemide  60 mg Oral BID  . guaiFENesin  600 mg Oral BID  . insulin aspart  0-15 Units Subcutaneous TID WC  . ipratropium-albuterol  3 mL Nebulization Q4H  . methylPREDNISolone (SOLU-MEDROL) injection  80 mg Intravenous Q12H  . mometasone-formoterol  2 puff Inhalation BID  . pantoprazole  40 mg Oral Daily  . potassium chloride SA  20 mEq Oral Daily  . pravastatin  20 mg Oral q1800  . vancomycin  1,000 mg Intravenous Q12H   Continuous Infusions:  PRN Meds:.acetaminophen, albuterol, ALPRAZolam, HYDROcodone-acetaminophen, ondansetron **OR** ondansetron (ZOFRAN) IV, traMADol Medications Prior to Admission:  Prior to Admission medications   Medication Sig Start Date End Date Taking? Authorizing Provider  acetaminophen (TYLENOL) 325 MG tablet Take 2 tablets (650 mg total) by mouth every 6 (six) hours as needed for mild pain (or Fever >/= 101). 05/19/15  Yes Belkys A Regalado, MD  albuterol (PROVENTIL HFA;VENTOLIN HFA) 108 (90 BASE) MCG/ACT inhaler Inhale 2 puffs into the lungs every 4 (four) hours as needed for wheezing or shortness of breath. 05/19/15  Yes Belkys A Regalado, MD  albuterol (PROVENTIL) (2.5 MG/3ML) 0.083% nebulizer solution Take 3 mLs (2.5 mg total) by nebulization every 6 (six) hours as needed for wheezing or shortness of breath. Dx 496 11/04/14  Yes Shanker Levora Dredge, MD  aspirin 325 MG tablet Take 325 mg by mouth daily.   Yes Historical Provider, MD  clopidogrel (PLAVIX) 75 MG tablet Take 1 tablet (75 mg total) by mouth daily. 11/04/14  Yes Shanker Levora Dredge, MD  fluticasone (FLONASE) 50 MCG/ACT nasal spray Place 2 sprays into both nostrils 2 (two) times  daily. 11/04/14  Yes Shanker Levora Dredge, MD  furosemide (LASIX) 20 MG tablet Take 3 tablets (60 mg total) by mouth 2 (two) times daily. Patient taking differently: Take 20 mg by mouth 3 (three) times daily.  06/03/15  Yes Richarda Overlie, MD  insulin glargine (LANTUS) 100 unit/mL SOPN Inject 6 Units into the skin daily.    Yes Historical Provider, MD  ipratropium (ATROVENT) 0.02 % nebulizer solution Take 2.5 mLs (0.5 mg total) by nebulization every 4 (four) hours as needed for wheezing or shortness of breath. 12/27/14  Yes Leslye Peer, MD  mometasone-formoterol (DULERA) 100-5 MCG/ACT AERO Inhale 2 puffs into the lungs 2 (two) times daily. 11/20/14  Yes Quentin Angst, MD  pantoprazole (PROTONIX) 40 MG tablet Take 1 tablet (40 mg total) by mouth 2 (two) times daily. 05/19/15  Yes Belkys A Regalado, MD  potassium chloride SA (K-DUR,KLOR-CON) 20 MEQ tablet Take 1 tablet (20 mEq total) by mouth daily. 11/04/14  Yes Shanker Levora Dredge, MD  pravastatin (PRAVACHOL) 20 MG tablet Take 1 tablet (20 mg total) by mouth daily at 6 PM. Patient taking differently: Take 20 mg by mouth daily at 12 noon.  06/03/15  Yes Richarda Overlie, MD  predniSONE (DELTASONE) 10 MG tablet Take 5 tablets daily for 3 days then 3 tablet daily Patient taking differently: Take 20-40 mg by mouth daily at 12 noon. Tapered course: as of 06/07/15, take 4 tablets (40 mg) for one more day, then take 3 tablets (30 mg) for 3 days, then take 2 tablets (20 mg) for 2 days, then stop 05/19/15  Yes Belkys A Regalado, MD  sulfamethoxazole-trimethoprim (BACTRIM DS,SEPTRA DS) 800-160 MG per tablet Take 1 tablet by mouth every 12 (twelve) hours. Patient taking differently: Take 1 tablet by mouth every 12 (twelve) hours. 10 day course started 06/06/15 06/03/15  Yes Richarda Overlie, MD  traMADol (ULTRAM) 50 MG tablet Take 2 tablets (100 mg total) by mouth every 6 (six) hours as needed for moderate pain. 05/19/15  Yes Belkys A Regalado, MD  ALPRAZolam (XANAX) 0.25 MG  tablet Take 1 tablet (0.25 mg total) by mouth 2 (two) times daily as needed for anxiety. 06/03/15   Richarda Overlie, MD  bisoprolol (ZEBETA) 5 MG tablet Take 1 tablet (5 mg total) by mouth 2 (two) times daily. 06/03/15   Richarda Overlie, MD  HYDROcodone-acetaminophen (NORCO) 5-325 MG per tablet Take 1 tablet by mouth every 6 (six) hours as needed. Patient taking  differently: Take 1 tablet by mouth every 6 (six) hours as needed (pain).  06/05/15   Toy Cookey, MD  metFORMIN (GLUCOPHAGE) 500 MG tablet Take 500 mg by mouth 2 (two) times daily with a meal.    Historical Provider, MD   Allergies  Allergen Reactions  . Lipitor [Atorvastatin Calcium] Other (See Comments)    Muscle aches   CBC:    Component Value Date/Time   WBC 11.2* 06/07/2015 1309   WBC 6.3 12/18/2014 1416   HGB 11.1* 06/07/2015 1309   HGB 10.9* 12/18/2014 1416   HCT 34.4* 06/07/2015 1309   HCT 34.2* 12/18/2014 1416   PLT 181 06/07/2015 1309   PLT 239 12/18/2014 1416   MCV 88.7 06/07/2015 1309   MCV 91 12/18/2014 1416   NEUTROABS 10.1* 06/07/2015 1309   LYMPHSABS 0.7 06/07/2015 1309   MONOABS 0.4 06/07/2015 1309   EOSABS 0.0 06/07/2015 1309   BASOSABS 0.0 06/07/2015 1309   Comprehensive Metabolic Panel:    Component Value Date/Time   NA 135 06/07/2015 1309   NA 139 12/18/2014 1416   K 3.5 06/07/2015 1309   K 4.5 12/18/2014 1416   CL 87* 06/07/2015 1309   CL 97* 12/18/2014 1416   CO2 35* 06/07/2015 1309   CO2 34* 12/18/2014 1416   BUN 17 06/07/2015 1309   BUN 9 12/18/2014 1416   CREATININE 1.00 06/07/2015 1309   CREATININE 0.86 12/18/2014 1416   CREATININE 0.85 01/10/2014 1215   GLUCOSE 140* 06/07/2015 1309   GLUCOSE 195* 12/18/2014 1416   CALCIUM 9.1 06/07/2015 1309   CALCIUM 8.9 12/18/2014 1416   AST 26 06/07/2015 1309   ALT 34 06/07/2015 1309   ALKPHOS 104 06/07/2015 1309   BILITOT 0.7 06/07/2015 1309   PROT 6.6 06/07/2015 1309   ALBUMIN 3.3* 06/07/2015 1309    Physical Exam: Vital Signs: BP 115/65  mmHg  Pulse 91  Temp(Src) 97.6 F (36.4 C) (Oral)  Resp 22  SpO2 100% SpO2: SpO2: 100 % O2 Device: O2 Device: Nasal Cannula O2 Flow Rate: O2 Flow Rate (L/min): 4 L/min (decreased to 3lpm) Intake/output summary:  Intake/Output Summary (Last 24 hours) at 06/08/15 1030 Last data filed at 06/08/15 0805  Gross per 24 hour  Intake   2000 ml  Output    775 ml  Net   1225 ml   LBM: Last BM Date: 06/06/15 Baseline Weight:   Most recent weight:     Exam Findings:   older than stated age appearing gentleman, mild distress Coarse rhonchi anteriorly, congested S1 S2 masked by loud adventitious lung sounds Trace edema Non focal         Palliative Performance Scale: 40%              Additional Data Reviewed: Recent Labs     06/05/15  1628  06/07/15  1309  WBC  13.6*  11.2*  HGB  9.8*  11.1*  PLT  189  181  NA  133*  135  BUN  27*  17  CREATININE  0.95  1.00     Time In: 0930  Time Out: 1025 Time Total: 55 min Greater than 50%  of this time was spent counseling and coordinating care related to the above assessment and plan.  Signed by: Rosalin Hawking, MD  Rosalin Hawking, MD  06/08/2015, 10:30 AM  Please contact Palliative Medicine Team phone at [phone] for questions and concerns.

## 2015-06-08 NOTE — Progress Notes (Signed)
Family meeting with sister Verlon Au, daughter Raynelle Fanning [phone]) and Julie's husband:  Discussed patient's respiratory status, underlying conditions of COPD, diastolic CHF and lung abscess.  Patient's family states that the patient has a very poor quality of life, has been declining for the past few months. The patient has restlessness, shortness of breath and generalized weakness at home. He has been requiring around the clock care. The patient's sister and daughter state that they would be welcoming of hospice support at home at the time of discharge.   Hospice consult request will be made through consulting case management, palliative will continue to follow along and help guide decision making and assist in symptom management. 25 minutes spent [name] [phone]

## 2015-06-08 NOTE — Progress Notes (Signed)
PROGRESS NOTE  [name] NWG:[id number] DOB: [birth year] DOA: 06/07/2015 PCP: Jeanann Lewandowsky, MD  Assessment/Plan: Acute on chronic respiratory failure with hypoxia -Due to COPD exacerbation, chronic diastolic CHF and MRSA lung abscess -CT chest with some improvement -He was seen by CT surgery and not felt to be a candidate for surgery due to advanced COPD -Also followed by pulmonary and not felt to be a candidate for percutaneous drainage due to risk of fistula and empyema -Has been on oral Bactrim, changed to IV vancomycin at this time -Also add gram-negative coverage -DNR now, BIpAP as needed   COPD exacerbation - antibiotic as noted above -IV Solu-Medrol, DuoNeb's, Mucinex -BiPAP as needed,    CAD -dual antiplatelet therapy and statin, no on BB due to severe COPD -troponin mildly elevated, cardiology last week recommended medical management for this   Chronic diastolic CHF (congestive heart failure) - Continue Lasix PO- does not appear to volume overloaded   DM (diabetes mellitus) type II uncontrolled, periph vascular disorder -Continue Lantus, and sliding-scale   MRSA pneumonia with RUL lung abscess -As dictated above  Poor overall prognosis  Code Status: dnr Family Communication: Disposition Plan:    Consultants:    Procedures:  Palliative care consult    HPI/Subjective: Feeling better  Objective: Filed Vitals:   06/08/15 1000  BP: 124/66  Pulse: 88  Temp:   Resp: 15    Intake/Output Summary (Last 24 hours) at 06/08/15 1056 Last data filed at 06/08/15 0805  Gross per 24 hour  Intake   2000 ml  Output    775 ml  Net   1225 ml   There were no vitals filed for this visit.  Exam:   General:  A+Ox3, NAD  Cardiovascular: rrr  Respiratory: coarse breath sounds, wheezing  Abdomen: +Bs, soft  Musculoskeletal: No edema   Data Reviewed: Basic Metabolic Panel:  Recent Labs Lab 06/02/15 1200 06/03/15 0630 06/05/15 1628  06/07/15 1309  NA 133* 134* 133* 135  K 4.4 4.1 4.1 3.5  CL 86* 86* 90* 87*  CO2 38* 38* 32 35*  GLUCOSE 211* 106* 218* 140*  BUN 16 15 27* 17  CREATININE 0.65 0.65 0.95 1.00  CALCIUM 9.0 8.8* 8.6* 9.1   Liver Function Tests:  Recent Labs Lab 06/02/15 1200 06/03/15 0630 06/07/15 1309  AST ALT 25 26 34  ALKPHOS 112 111 104  BILITOT 0.4 0.3 0.7  PROT 5.5* 5.3* 6.6  ALBUMIN 2.7* 2.8* 3.3*   No results for input(s): LIPASE, AMYLASE in the last 168 hours. No results for input(s): AMMONIA in the last 168 hours. CBC:  Recent Labs Lab 06/02/15 1200 06/05/15 1628 06/07/15 1309  WBC 11.4* 13.6* 11.2*  NEUTROABS  --   --  10.1*  HGB 9.9* 9.8* 11.1*  HCT 31.3* 31.2* 34.4*  MCV 87.7 89.1 88.7  PLT 215 189 181   Cardiac Enzymes:  Recent Labs Lab 06/07/15 1309 06/07/15 1721 06/07/15 2230  TROPONINI 0.06* 0.07* 0.06*   BNP (last 3 results)  Recent Labs  05/04/15 2318 05/24/15 0415 06/07/15 1309  BNP 136.0* 350.6* 102.5*    ProBNP (last 3 results)  Recent Labs  10/27/14 2311  PROBNP 89.0    CBG:  Recent Labs Lab 06/03/15 1631 06/07/15 1841 06/07/15 1959 06/08/15 0034 06/08/15 0807  GLUCAP 184* 171* 183* 159* 146*    Recent Results (from the past 240 hour(s))  Blood culture (routine x 2)     Status: None (Preliminary result)  Collection Time: 06/07/15  1:05 PM  Result Value Ref Range Status   Specimen Description BLOOD LEFT ANTECUBITAL  Final   Special Requests BOTTLES DRAWN AEROBIC AND ANAEROBIC 5CC  Final   Culture PENDING  Incomplete   Report Status PENDING  Incomplete  MRSA PCR Screening     Status: None   Collection Time: 06/07/15  7:13 PM  Result Value Ref Range Status   MRSA by PCR NEGATIVE NEGATIVE Final    Comment:        The GeneXpert MRSA Assay (FDA approved for NASAL specimens only), is one component of a comprehensive MRSA colonization surveillance program. It is not intended to diagnose MRSA infection nor to  guide or monitor treatment for MRSA infections.      Studies: Ct Chest W Contrast  06/07/2015   CLINICAL DATA:  63 year old male with shortness of breath. Right upper lobe pulmonary abscess. Subsequent encounter. COPD.  EXAM: CT CHEST WITH CONTRAST  TECHNIQUE: Multidetector CT imaging of the chest was performed during intravenous contrast administration.  CONTRAST:  75mL OMNIPAQUE IOHEXOL 300 MG/ML  SOLN  COMPARISON:  Chest radiographs 1310 hours today and earlier. Chest CT 05/15/2015  FINDINGS: Regressed but unresolved medial right upper lobe fluid containing cavitary lesion, now measuring 52 x 62 mm (versus 67 x 85 mm previously). Fairly thin wall with surrounding patchy and nodular airspace opacity as before. Ventilation in the region has mildly improved overall.  Major airways are patent. The upper lobe predominant centrilobular emphysema with mild septal thickening is stable. No pleural effusions. Left lower lobe peribronchial thickening has increased. No new confluent pulmonary opacity.  No pericardial effusion. No mediastinal lymphadenopathy. Stable small right hilar nodes up to 8 mm short axis. Stable thoracic inlet. Major mediastinal vascular structures appear stable, with moderate to severe aortic and coronary artery soft and calcified atherosclerosis.  No axillary lymphadenopathy. Stable visualized liver, gallbladder, spleen, pancreas, adrenal glands, kidneys, and bowel in the upper abdomen.  Largely resolved vertebral body gas previously-seen at some levels. No new osseous abnormality identified. Thoracic epidural lipomatosis re- identified. Compression fractures Re identified. Underlying osteopenia.  IMPRESSION: 1. Regressed but not resolved medial right upper lobe cavitary lesion containing fluid compatible with pulmonary abscess. Recommend continued imaging follow up to resolution. 2. New left lower lobe peribronchial thickening may reflect exacerbation of infectious airway disease. 3.  Otherwise stable chest.   Electronically Signed   By: Odessa Fleming M.D.   On: 06/07/2015 18:11   Dg Chest Portable 1 View  06/07/2015   CLINICAL DATA:  Shortness of breath.  EXAM: PORTABLE CHEST - 1 VIEW  COMPARISON:  June 05, 2015.  FINDINGS: The heart size and mediastinal contours are within normal limits. No pneumothorax or pleural effusion is noted. Left lung is clear. Continued presence of cavitary lesion is seen medially in right upper lobe. The visualized skeletal structures are unremarkable.  IMPRESSION: Continued presence of cavitary lesion seen medially in right upper lobe concerning for abscess. No significant change compared to prior exam.   Electronically Signed   By: Lupita Raider, M.D.   On: 06/07/2015 13:27    Scheduled Meds: . antiseptic oral rinse  7 mL Mouth Rinse BID  . aspirin  325 mg Oral Daily  . bisoprolol  5 mg Oral BID  . cefTAZidime (FORTAZ)  IV  2 g Intravenous Q8H  . clopidogrel  75 mg Oral Daily  . enoxaparin (LOVENOX) injection  40 mg Subcutaneous Q24H  . fluticasone  2 spray  Each Nare BID  . furosemide  60 mg Oral BID  . guaiFENesin  600 mg Oral BID  . insulin aspart  0-15 Units Subcutaneous TID WC  . ipratropium-albuterol  3 mL Nebulization Q4H  . methylPREDNISolone (SOLU-MEDROL) injection  80 mg Intravenous Q12H  . mometasone-formoterol  2 puff Inhalation BID  . pantoprazole  40 mg Oral Daily  . potassium chloride SA  20 mEq Oral Daily  . pravastatin  20 mg Oral q1800  . vancomycin  1,000 mg Intravenous Q12H   Continuous Infusions:  Antibiotics Given (last 72 hours)    Date/Time Action Medication Dose Rate   06/07/15 2015 Given   vancomycin (VANCOCIN) IVPB 1000 mg/200 mL premix 1,000 mg 200 mL/hr   06/08/15 0505 Given   vancomycin (VANCOCIN) IVPB 1000 mg/200 mL premix 1,000 mg 200 mL/hr      Active Problems:   Acute on chronic respiratory failure with hypoxia   COPD (chronic obstructive pulmonary disease)   COPD exacerbation   OSA (obstructive  sleep apnea)   Acute on chronic diastolic CHF (congestive heart failure)   DM (diabetes mellitus) type II uncontrolled, periph vascular disorder   MRSA pneumonia with RUL lung abscess   Chronic respiratory failure with hypercapnia   Encounter for palliative care    Time spent: 25 min    [facility], Aydin Cavalieri  [facility] Pager [phone]. If 7PM-7AM, please contact night-coverage at [email or web address], password [facility] 06/08/2015, 10:56 AM  LOS: 1 day

## 2015-06-08 NOTE — Progress Notes (Signed)
Utilization review completed.  

## 2015-06-09 ENCOUNTER — Inpatient Hospital Stay: Payer: Medicare Other | Admitting: Family Medicine

## 2015-06-09 LAB — GLUCOSE, CAPILLARY
GLUCOSE-CAPILLARY: 221 mg/dL — AB (ref 65–99)
GLUCOSE-CAPILLARY: 181 mg/dL — AB (ref 65–99)
Glucose-Capillary: 212 mg/dL — ABNORMAL HIGH (ref 65–99)
Glucose-Capillary: 276 mg/dL — ABNORMAL HIGH (ref 65–99)

## 2015-06-09 MED ORDER — INSULIN GLARGINE 100 UNIT/ML ~~LOC~~ SOLN
6.0000 [IU] | Freq: Every day | SUBCUTANEOUS | Status: DC
  Administered 2015-06-09 – 2015-06-11 (×3): 6 [IU] via SUBCUTANEOUS
  Filled 2015-06-09 (×4): qty 0.06

## 2015-06-09 MED ORDER — INSULIN ASPART 100 UNIT/ML ~~LOC~~ SOLN
0.0000 [IU] | Freq: Three times a day (TID) | SUBCUTANEOUS | Status: DC
  Administered 2015-06-09: 3 [IU] via SUBCUTANEOUS
  Administered 2015-06-10: 5 [IU] via SUBCUTANEOUS
  Administered 2015-06-10: 11 [IU] via SUBCUTANEOUS
  Administered 2015-06-10 – 2015-06-11 (×4): 5 [IU] via SUBCUTANEOUS
  Administered 2015-06-12: 2 [IU] via SUBCUTANEOUS
  Administered 2015-06-12: 5 [IU] via SUBCUTANEOUS

## 2015-06-09 MED ORDER — IOHEXOL 300 MG/ML  SOLN
100.0000 mL | Freq: Once | INTRAMUSCULAR | Status: AC | PRN
  Administered 2015-06-09: 100 mL via INTRAVENOUS

## 2015-06-09 MED ORDER — INSULIN GLARGINE 100 UNIT/ML ~~LOC~~ SOLN
6.0000 [IU] | Freq: Every day | SUBCUTANEOUS | Status: DC
  Filled 2015-06-09 (×2): qty 0.06

## 2015-06-09 MED ORDER — INSULIN ASPART 100 UNIT/ML ~~LOC~~ SOLN
0.0000 [IU] | Freq: Every day | SUBCUTANEOUS | Status: DC
  Administered 2015-06-09 – 2015-06-10 (×2): 3 [IU] via SUBCUTANEOUS
  Administered 2015-06-11: 4 [IU] via SUBCUTANEOUS

## 2015-06-09 MED ORDER — DOCUSATE SODIUM 100 MG PO CAPS
100.0000 mg | ORAL_CAPSULE | Freq: Two times a day (BID) | ORAL | Status: DC
  Administered 2015-06-09 (×2): 100 mg via ORAL
  Filled 2015-06-09 (×4): qty 1

## 2015-06-09 MED ORDER — METHYLPREDNISOLONE SODIUM SUCC 125 MG IJ SOLR
60.0000 mg | Freq: Two times a day (BID) | INTRAMUSCULAR | Status: AC
Start: 2015-06-09 — End: 2015-06-11
  Administered 2015-06-09 – 2015-06-11 (×5): 60 mg via INTRAVENOUS
  Filled 2015-06-09 (×4): qty 0.96

## 2015-06-09 NOTE — Care Management Note (Signed)
Case Management Note  Patient Details  Name: [name] MRN: [id number] Date of Birth: 09-Jul-1951  Subjective/Objective:   Pt lives with sister, supportive dtr lives in the area.  Attending, pt and family request referral to hospice provider.  Provided list of hospice agencies, referral called to [facility] as requested.                                 Expected Discharge Plan:  Home w Hospice Care  In-House Referral:  Hospice / Palliative Care  Discharge planning Services  CM Consult  Post Acute Care Choice:  Hospice Choice offered to:  Sibling  DME Arranged:    DME Agency:     HH Arranged:  RN HH Agency:  [facility]  Status of Service:  In process, will continue to follow  Medicare Important Message Given:    Date Medicare IM Given:    Medicare IM give by:    Date Additional Medicare IM Given:    Additional Medicare Important Message give by:     If discussed at Long Length of Stay Meetings, dates discussed:    Additional Comments:  Magdalene River, RN 06/09/2015, 9:27 AM

## 2015-06-09 NOTE — Evaluation (Signed)
Physical Therapy Evaluation Patient Details Name: [name] MRN: [id number] DOB: Dec 03, 1951 Today's Date: 06/09/2015   History of Present Illness  KAVAN KIERAN is a 64 y.o. male with end stage COPD, on 3-4 L home oxygen, MRSA lung abscess, history of stroke, chronic back pain, diabetes, diastolic CHF, CAD with PCI was just discharged from [facility] 4 days ago.Readmit with SOB, fever and chills.  COPD exacerbation.  Clinical Impression  Pt admitted with above diagnosis. Pt currently with functional limitations due to the deficits listed below (see PT Problem List). Pt wants to go home with hospice care with HHPT,HHOT and HHAide. Sister to assist pt.  HAs equipment.  Will follow acutely.  Pt will benefit from skilled PT to increase their independence and safety with mobility to allow discharge to the venue listed below.      Follow Up Recommendations Home health PT;Supervision/Assistance - 24 hour (HHOT, HHAide)    Equipment Recommendations  Other (comment) (TBA)    Recommendations for Other Services       Precautions / Restrictions Precautions Precautions: Fall Restrictions Weight Bearing Restrictions: No      Mobility  Bed Mobility Overal bed mobility: Needs Assistance Bed Mobility: Supine to Sit     Supine to sit: Mod assist     General bed mobility comments: assist for LEs and elevation of trunk.   Transfers Overall transfer level: Needs assistance Equipment used: None Transfers: Sit to/from [employer] Sit to Stand: Mod assist Stand pivot transfers: Min assist       General transfer comment: Mod assist for boost to stand from lowest bed setting. VC for hand placement and positioning prior to standing  Ambulation/Gait Ambulation/Gait assistance: Min assist Ambulation Distance (Feet): 2 Feet Assistive device: 2 person hand held assist       General Gait Details: Willing to take several small side steps towards recliner. Very  anxious. Heavy use of upper extremity support using armrests of chair with trunk flexed. No overt buckling of knees however appear slightly unstable.   Stairs            Wheelchair Mobility    Modified Rankin (Stroke Patients Only)       Balance Overall balance assessment: Needs assistance;History of Falls Sitting-balance support: Bilateral upper extremity supported;Feet supported Sitting balance-Leahy Scale: Fair Sitting balance - Comments: able to sit EOB a few min without Ue support.    Standing balance support: Bilateral upper extremity supported;During functional activity Standing balance-Leahy Scale: Poor Standing balance comment: Needs UE support for standing balance.                              Pertinent Vitals/Pain Pain Assessment: No/denies pain  100% on 4LO2, 147/83, 80 bpm.      Home Living Family/patient expects to be discharged to:: Private residence Living Arrangements: Other relatives (sister) Available Help at Discharge: Available 24 hours/day Type of Home: House Home Access: Level entry     Home Layout: One level Home Equipment: Bedside commode;Shower seat;Tub bench;Walker - 2 wheels;Cane - single point;Grab bars - tub/shower;Adaptive equipment Additional Comments: If goes home with daughter she has 5 steps to enter and can stay on main level.     Prior Function Level of Independence: Needs assistance   Gait / Transfers Assistance Needed: transfers with min assist.   ADL's / Homemaking Assistance Needed: reports he has been sponge bathing and could dress himself  Hand Dominance   Dominant Hand: Right    Extremity/Trunk Assessment   Upper Extremity Assessment: Defer to OT evaluation           Lower Extremity Assessment: RLE deficits/detail;LLE deficits/detail RLE Deficits / Details: grossly 3-/5 LLE Deficits / Details: grossly 3-/5  Cervical / Trunk Assessment: Normal  Communication   Communication: No  difficulties  Cognition Arousal/Alertness: Awake/alert Behavior During Therapy: Anxious Overall Cognitive Status: No family/caregiver present to determine baseline cognitive functioning       Memory: Decreased short-term memory              General Comments General comments (skin integrity, edema, etc.): Pt requesting breathing treatment.  RT just happened to be in hall and came in directly behind PT. Once pt in chair, RT gave breathing treatment.      Exercises General Exercises - Lower Extremity Ankle Circles/Pumps: AROM;Both;10 reps;Seated      Assessment/Plan    PT Assessment Patient needs continued PT services  PT Diagnosis Generalized weakness;Difficulty walking   PT Problem List Pain;Obesity;Cardiopulmonary status limiting activity;Decreased safety awareness;Decreased knowledge of use of DME;Decreased mobility;Decreased activity tolerance;Decreased range of motion;Decreased strength  PT Treatment Interventions Patient/family education;Manual techniques;Therapeutic exercise;Therapeutic activities;Functional mobility training;Gait training;DME instruction   PT Goals (Current goals can be found in the Care Plan section) Acute Rehab PT Goals Patient Stated Goal: go home PT Goal Formulation: With patient Time For Goal Achievement: 06/23/15 Potential to Achieve Goals: Fair    Frequency Min 3X/week   Barriers to discharge Decreased caregiver support (sister can provide only supervision)      Co-evaluation               End of Session Equipment Utilized During Treatment: Gait belt;Oxygen Activity Tolerance: Patient limited by fatigue Patient left: in chair;with call bell/phone within reach Nurse Communication: Mobility status         Time: 9147-8295 PT Time Calculation (min) (ACUTE ONLY): 13 min   Charges:   PT Evaluation $Initial PT Evaluation Tier I: 1 Procedure     PT G CodesBerline Lopes 06/30/15, 2:08 PM [facility] Acute  Rehabilitation [phone] (pager)

## 2015-06-09 NOTE — Progress Notes (Signed)
Patient arrived on unit via chair from [facility].  No family at bedside.

## 2015-06-09 NOTE — Progress Notes (Signed)
Chaplain facilitated completion of advanced directive. Pt asked for chaplain to print his name on document, and print "special instructions." Chaplain wrote special instructions verbatim. Copies produced upon pt request. Chaplain also offered emotional support and offered to keep pt in prayer. Gesture appreciated. Page chaplain as needed.    06/09/15 1400  Clinical Encounter Type  Visited With Patient  Visit Type Follow-up;Spiritual support  Advance Directives (For Healthcare)  Does patient have an advance directive? Yes  Type of Advance Directive Healthcare Power of Attorney  Copy of advanced directive(s) in chart? No - copy requested  Jiles Harold 06/09/2015 2:41 PM

## 2015-06-09 NOTE — Progress Notes (Signed)
Notified by Cammie Mcgee, CM of pt./family request for [facility] services at home after discharge.   Writer poke with patient at the bedside to initiate education related to hospice philosophy, services and team approach to care. Patient voiced understanding of information provided. TC made to sister after visit to review services and confirm equipment. She will be the contact person to call to arrange time of delivery.  Plan for discharge home with family or [facility] is undecided at this time.  Please send signed and completed DNR form home with patient. Patient will need prescriptions for discharge comfort medications.  DME needs discussed and family requests delivery of an electric bed, APP, table and to change out oxygen equipment tomorrow 06/10/15. [facility] equipment manager Jewel Kizzie Bane notified and will contact [facility] to arrange delivery to the home. The home address has been verified.  [facility] aware of above. Please notify [facility] when pt. Is ready to eave the unit at discharge- call [phone] ( or [phone] after 5 pm). [facility] information and contact numbers have been given to the patient during the visit. Please call with any questions.  Sharen Heck RN [facility] Liaison [phone]

## 2015-06-09 NOTE — Progress Notes (Signed)
Attempted report for transfer. 

## 2015-06-09 NOTE — Progress Notes (Signed)
Called report to Lurena Joiner  RN Pt to be transferred to [address] bed 26 .

## 2015-06-09 NOTE — Progress Notes (Signed)
Chaplain responded to spiritual care consult for advanced directive. Chaplain explained contents to pt and family. Currently they are filling it out. Chaplain informed pt and pt nurse to page chaplain when the document is ready to sign.   06/09/15 1000  Clinical Encounter Type  Visited With Patient and family together  Visit Type Initial;Spiritual support  Referral From Physician  Advance Directives (For Healthcare)  Does patient have an advance directive? No  Would patient like information on creating an advanced directive? Yes - Transport planner given;Yes - Spiritual care consult ordered  Jiles Harold 06/09/2015 10:44 AM

## 2015-06-09 NOTE — Progress Notes (Signed)
Pt transferred to [address] bed 26 per recliner with all belongings accompanied by RN and nurse tech

## 2015-06-09 NOTE — Progress Notes (Signed)
Inpatient Diabetes Program Recommendations  AACE/ADA: New Consensus Statement on Inpatient Glycemic Control (2013)  Target Ranges:  Prepandial:   less than 140 mg/dL      Peak postprandial:   less than 180 mg/dL (1-2 hours)      Critically ill patients:  140 - 180 mg/dL   Results for [name], [name] (MRN [id number]) as of 06/09/2015 12:45  Ref. Range 06/08/2015 08:07 06/08/2015 14:20 06/08/2015 14:54 06/08/2015 17:37 06/08/2015 22:07 06/09/2015 08:11 06/09/2015 11:38  Glucose-Capillary Latest Ref Range: 65-99 mg/dL 100 (H) 712 (H) 197 (H) 140 (H) 294 (H) 221 (H) 212 (H)   Diabetes history: DM2 Outpatient Diabetes medications: Lantus 6 units daily, Metformin 500 mg BID Current orders for Inpatient glycemic control: Novolog 0-15 units TID with meals, Novolog 0-5 units HS  Inpatient Diabetes Program Recommendations Insulin - Basal: Fasting glucose 221 mg/dl this morning. Please consider ordering Lantus 6 units QHS (same dose as taken as an outpatient).  Correction (SSI): Noted bedtime glucose 294 mg/dl last night but no insulin given since there were no orders for bedtime correction. Please consider ordering Novolog bedtime correction scale.  Thanks, Orlando Penner, RN, MSN, CCRN, CDE Diabetes Coordinator Inpatient Diabetes Program [phone] (Team Pager from 8am to 5pm) [phone] (AP office) [phone] [facility] office) [phone] [facility] office)

## 2015-06-09 NOTE — Progress Notes (Signed)
PROGRESS NOTE  [name] ZOX:[id number] DOB: 1951/09/05 DOA: 06/07/2015 PCP: Jeanann Lewandowsky, MD  [name] is a 64 y.o. male with end stage COPD, on 3-4 L home oxygen, MRSA lung abscess, history of stroke, chronic back pain, diabetes, diastolic CHF, CAD with PCI was just discharged from [facility] days ago. He was treated for COPD exacerbation and MRSA lung abscess. For his MRSA lung abscess he was seen by CT surgery who felt that he was not a candidate for surgery due to his advanced COPD, he was evaluated by pulmonary for percutaneous drainage. Pulmonary did not feel that he was a good candidate for percutaneous drainage due to risk of fistula and empyema. He was treated with IV vancomycin in the hospital and discharged home on oral Bactrim with plan for close pulmonary follow-up. He has been miserable since discharge, was seen in the ER at [facility] long 2 days after discharge for shortness of breath and discharged home after breathing treatments.  Presents with worsening shortness of breath, cough productive of yellow sputum, subjective fevers and chills.  Assessment/Plan: Acute on chronic respiratory failure with hypoxia -Due to COPD exacerbation, chronic diastolic CHF and MRSA lung abscess -CT chest with some improvement -He was seen by CT surgery and not felt to be a candidate for surgery due to advanced COPD -Also followed by pulmonary and not felt to be a candidate for percutaneous drainage due to risk of fistula and empyema -Has been on oral Bactrim, changed to IV vancomycin at this time -Also add gram-negative coverage -DNR now, BIpAP as needed   COPD exacerbation - antibiotic as noted above -IV Solu-Medrol, DuoNeb's, Mucinex -BiPAP as needed,    CAD -dual antiplatelet therapy and statin, no on BB due to severe COPD -troponin mildly elevated, cardiology last week recommended medical management for this   Chronic diastolic CHF (congestive heart  failure) - Continue Lasix PO- does not appear to volume overloaded   DM (diabetes mellitus) type II uncontrolled, periph vascular disorder -Continue Lantus, and sliding-scale   MRSA pneumonia with RUL lung abscess -As dictated above  Orthopantogram done as patient felt that his wisdom teeth were coming in- no sign   Poor overall prognosis Plan is for patient to go home with hospice at home once stable  Code Status: dnr Family Communication: Disposition Plan:    Consultants:    Procedures:  Palliative care consult    HPI/Subjective: O2 sats drop with exertion  Objective: Filed Vitals:   06/09/15 0742  BP: 131/70  Pulse: 72  Temp:   Resp: 16    Intake/Output Summary (Last 24 hours) at 06/09/15 0747 Last data filed at 06/09/15 0424  Gross per 24 hour  Intake    780 ml  Output   1650 ml  Net   -870 ml   Filed Weights   06/09/15 0029  Weight: 80.2 kg (176 lb 12.9 oz)    Exam:   General:  A+Ox3, NAD  Cardiovascular: rrr  Respiratory: diminished, no wheezing  Abdomen: +Bs, soft  Musculoskeletal: No edema   Data Reviewed: Basic Metabolic Panel:  Recent Labs Lab 06/02/15 1200 06/03/15 0630 06/05/15 1628 06/07/15 1309  NA 133* 134* 133* 135  K 4.4 4.1 4.1 3.5  CL 86* 86* 90* 87*  CO2 38* 38* 32 35*  GLUCOSE 211* 106* 218* 140*  BUN 16 15 27* 17  CREATININE 0.65 0.65 0.95 1.00  CALCIUM 9.0 8.8* 8.6* 9.1   Liver Function Tests:  Recent  Labs Lab 06/02/15 1200 06/03/15 0630 06/07/15 1309  AST ALT 25 26 34  ALKPHOS 112 111 104  BILITOT 0.4 0.3 0.7  PROT 5.5* 5.3* 6.6  ALBUMIN 2.7* 2.8* 3.3*   No results for input(s): LIPASE, AMYLASE in the last 168 hours. No results for input(s): AMMONIA in the last 168 hours. CBC:  Recent Labs Lab 06/02/15 1200 06/05/15 1628 06/07/15 1309  WBC 11.4* 13.6* 11.2*  NEUTROABS  --   --  10.1*  HGB 9.9* 9.8* 11.1*  HCT 31.3* 31.2* 34.4*  MCV 87.7 89.1 88.7  PLT 215 189 181    Cardiac Enzymes:  Recent Labs Lab 06/07/15 1309 06/07/15 1721 06/07/15 2230  TROPONINI 0.06* 0.07* 0.06*   BNP (last 3 results)  Recent Labs  05/04/15 2318 05/24/15 0415 06/07/15 1309  BNP 136.0* 350.6* 102.5*    ProBNP (last 3 results)  Recent Labs  10/27/14 2311  PROBNP 89.0    CBG:  Recent Labs Lab 06/08/15 0807 06/08/15 1420 06/08/15 1454 06/08/15 1737 06/08/15 2207  GLUCAP 146* 153* 163* 140* 294*    Recent Results (from the past 240 hour(s))  Blood culture (routine x 2)     Status: None (Preliminary result)   Collection Time: 06/07/15  1:05 PM  Result Value Ref Range Status   Specimen Description BLOOD LEFT ANTECUBITAL  Final   Special Requests BOTTLES DRAWN AEROBIC AND ANAEROBIC 5CC  Final   Culture NO GROWTH 1 DAY  Final   Report Status PENDING  Incomplete  Blood culture (routine x 2)     Status: None (Preliminary result)   Collection Time: 06/07/15  1:10 PM  Result Value Ref Range Status   Specimen Description BLOOD RIGHT HAND  Final   Special Requests BOTTLES DRAWN AEROBIC AND ANAEROBIC 5CC  Final   Culture NO GROWTH 1 DAY  Final   Report Status PENDING  Incomplete  MRSA PCR Screening     Status: None   Collection Time: 06/07/15  7:13 PM  Result Value Ref Range Status   MRSA by PCR NEGATIVE NEGATIVE Final    Comment:        The GeneXpert MRSA Assay (FDA approved for NASAL specimens only), is one component of a comprehensive MRSA colonization surveillance program. It is not intended to diagnose MRSA infection nor to guide or monitor treatment for MRSA infections.      Studies: Dg Orthopantogram  06/08/2015   CLINICAL DATA:  Left dental pain  EXAM: ORTHOPANTOGRAM/PANORAMIC  COMPARISON:  None.  FINDINGS: The patient is edentulous. No gross evidence for displaced fracture or gross erosion.  IMPRESSION: No gross acute abnormality identified. Outpatient dedicated dental radiographs and consultation recommended.   Electronically Signed    By: Christiana Pellant M.D.   On: 06/08/2015 14:17   Ct Chest W Contrast  06/07/2015   CLINICAL DATA:  64 year old male with shortness of breath. Right upper lobe pulmonary abscess. Subsequent encounter. COPD.  EXAM: CT CHEST WITH CONTRAST  TECHNIQUE: Multidetector CT imaging of the chest was performed during intravenous contrast administration.  CONTRAST:  75mL OMNIPAQUE IOHEXOL 300 MG/ML  SOLN  COMPARISON:  Chest radiographs 1310 hours today and earlier. Chest CT 05/15/2015  FINDINGS: Regressed but unresolved medial right upper lobe fluid containing cavitary lesion, now measuring 52 x 62 mm (versus 67 x 85 mm previously). Fairly thin wall with surrounding patchy and nodular airspace opacity as before. Ventilation in the region has mildly improved overall.  Major airways are patent. The  upper lobe predominant centrilobular emphysema with mild septal thickening is stable. No pleural effusions. Left lower lobe peribronchial thickening has increased. No new confluent pulmonary opacity.  No pericardial effusion. No mediastinal lymphadenopathy. Stable small right hilar nodes up to 8 mm short axis. Stable thoracic inlet. Major mediastinal vascular structures appear stable, with moderate to severe aortic and coronary artery soft and calcified atherosclerosis.  No axillary lymphadenopathy. Stable visualized liver, gallbladder, spleen, pancreas, adrenal glands, kidneys, and bowel in the upper abdomen.  Largely resolved vertebral body gas previously-seen at some levels. No new osseous abnormality identified. Thoracic epidural lipomatosis re- identified. Compression fractures Re identified. Underlying osteopenia.  IMPRESSION: 1. Regressed but not resolved medial right upper lobe cavitary lesion containing fluid compatible with pulmonary abscess. Recommend continued imaging follow up to resolution. 2. New left lower lobe peribronchial thickening may reflect exacerbation of infectious airway disease. 3. Otherwise stable chest.    Electronically Signed   By: Odessa Fleming M.D.   On: 06/07/2015 18:11   Dg Chest Portable 1 View  06/07/2015   CLINICAL DATA:  Shortness of breath.  EXAM: PORTABLE CHEST - 1 VIEW  COMPARISON:  June 05, 2015.  FINDINGS: The heart size and mediastinal contours are within normal limits. No pneumothorax or pleural effusion is noted. Left lung is clear. Continued presence of cavitary lesion is seen medially in right upper lobe. The visualized skeletal structures are unremarkable.  IMPRESSION: Continued presence of cavitary lesion seen medially in right upper lobe concerning for abscess. No significant change compared to prior exam.   Electronically Signed   By: Lupita Raider, M.D.   On: 06/07/2015 13:27    Scheduled Meds: . antiseptic oral rinse  7 mL Mouth Rinse BID  . aspirin  325 mg Oral Daily  . bisoprolol  5 mg Oral BID  . cefTAZidime (FORTAZ)  IV  2 g Intravenous Q8H  . clopidogrel  75 mg Oral Daily  . enoxaparin (LOVENOX) injection  40 mg Subcutaneous Q24H  . fluticasone  2 spray Each Nare BID  . furosemide  60 mg Oral BID  . guaiFENesin  600 mg Oral BID  . insulin aspart  0-15 Units Subcutaneous TID WC  . ipratropium-albuterol  3 mL Nebulization Q4H  . methylPREDNISolone (SOLU-MEDROL) injection  80 mg Intravenous Q12H  . mometasone-formoterol  2 puff Inhalation BID  . pantoprazole  40 mg Oral Daily  . potassium chloride SA  20 mEq Oral Daily  . pravastatin  20 mg Oral q1800  . vancomycin  1,000 mg Intravenous Q12H   Continuous Infusions:  Antibiotics Given (last 72 hours)    Date/Time Action Medication Dose Rate   06/07/15 2015 Given   vancomycin (VANCOCIN) IVPB 1000 mg/200 mL premix 1,000 mg 200 mL/hr   06/08/15 0505 Given   vancomycin (VANCOCIN) IVPB 1000 mg/200 mL premix 1,000 mg 200 mL/hr   06/08/15 1735 Given   vancomycin (VANCOCIN) IVPB 1000 mg/200 mL premix 1,000 mg 200 mL/hr   06/09/15 0424 Given   vancomycin (VANCOCIN) IVPB 1000 mg/200 mL premix 1,000 mg 200 mL/hr       Active Problems:   Acute on chronic respiratory failure with hypoxia   COPD (chronic obstructive pulmonary disease)   COPD exacerbation   OSA (obstructive sleep apnea)   Acute on chronic diastolic CHF (congestive heart failure)   DM (diabetes mellitus) type II uncontrolled, periph vascular disorder   MRSA pneumonia with RUL lung abscess   Chronic respiratory failure with hypercapnia   Encounter  for palliative care    Time spent: 25 min    Marlin Canary  [facility] Pager [phone]. If 7PM-7AM, please contact night-coverage at [email or web address], password [facility] 06/09/2015, 7:47 AM  LOS: 2 days

## 2015-06-10 DIAGNOSIS — G4733 Obstructive sleep apnea (adult) (pediatric): Secondary | ICD-10-CM

## 2015-06-10 LAB — BASIC METABOLIC PANEL
Anion gap: 10 (ref 5–15)
BUN: 13 mg/dL (ref 6–20)
CO2: 38 mmol/L — ABNORMAL HIGH (ref 22–32)
Calcium: 8.4 mg/dL — ABNORMAL LOW (ref 8.9–10.3)
Chloride: 89 mmol/L — ABNORMAL LOW (ref 101–111)
Creatinine, Ser: 0.58 mg/dL — ABNORMAL LOW (ref 0.61–1.24)
GFR calc Af Amer: >60 mL/min (ref >60)
GFR calc non Af Amer: >60 mL/min (ref >60)
Glucose, Bld: 220 mg/dL — ABNORMAL HIGH (ref 65–99)
POTASSIUM: 3.8 mmol/L (ref 3.5–5.1)
Sodium: 137 mmol/L (ref 135–145)

## 2015-06-10 LAB — GLUCOSE, CAPILLARY
GLUCOSE-CAPILLARY: 229 mg/dL — AB (ref 65–99)
Glucose-Capillary: 201 mg/dL — ABNORMAL HIGH (ref 65–99)
Glucose-Capillary: 303 mg/dL — ABNORMAL HIGH (ref 65–99)
Glucose-Capillary: 252 mg/dL — ABNORMAL HIGH (ref 65–99)

## 2015-06-10 LAB — VANCOMYCIN, TROUGH: Vancomycin Tr: 18 ug/mL (ref 10.0–20.0)

## 2015-06-10 MED ORDER — BENZOCAINE 10 % MT GEL
Freq: Three times a day (TID) | OROMUCOSAL | Status: DC | PRN
  Filled 2015-06-10: qty 9.4

## 2015-06-10 MED ORDER — ALPRAZOLAM 0.5 MG PO TABS
0.5000 mg | ORAL_TABLET | Freq: Once | ORAL | Status: AC
  Administered 2015-06-10: 0.5 mg via ORAL
  Filled 2015-06-10: qty 1

## 2015-06-10 MED ORDER — SENNOSIDES-DOCUSATE SODIUM 8.6-50 MG PO TABS
1.0000 | ORAL_TABLET | Freq: Every day | ORAL | Status: DC
  Administered 2015-06-11: 1 via ORAL
  Filled 2015-06-10 (×2): qty 1

## 2015-06-10 NOTE — Progress Notes (Signed)
ANTIBIOTIC CONSULT NOTE - FOLLOW UP  Pharmacy Consult for Vancomycin and Ceftazidime Indication: PNA and chronic diastolic MRSA lung abscess  Patient Measurements: Height: 5\' 11"  (180.3 cm) Weight: 176 lb 12.9 oz (80.2 kg) IBW/kg (Calculated) : 75.3  Vital Signs: Temp: 98 F (36.7 C) (06/21 1732) Temp Source: Oral (06/21 1732) BP: 126/69 mmHg (06/21 1732) Pulse Rate: 83 (06/21 1732) Intake/Output from previous day: 06/20 0701 - 06/21 0700 In: 1490 [P.O.:940; IV Piggyback:550] Out: 1330 [Urine:1330] Intake/Output from this shift: Total I/O In: 240 [P.O.:240] Out: 350 [Urine:350]  Labs:  Recent Labs  06/10/15 1620  CREATININE 0.58*   Estimated Creatinine Clearance: 98 mL/min (by C-G formula based on Cr of 0.58).  Recent Labs  06/10/15 1620  VANCOTROUGH 18    Assessment:  Vancomycin trough level prior to 5pm dose is at goal.  No dosing adjustment needed.  Scr trended down to 0.58, previous low baseline.  Goal of Therapy:  Vancomycin trough level 15-20 mcg/ml  Plan:   Continue Vancomycin 1 gram IV q12hrs.  Also continues Ceftazidime 2 gm IV q8hrs.  Next bmet by 6/24.  Weekly trough level while on Vancomycin.  Dennie Fetters, RPh Pager: [phone] 06/10/2015,6:55 PM

## 2015-06-10 NOTE — Care Management Note (Signed)
Case Management Note  Patient Details  Name: [name] MRN: [id number] Date of Birth: [birth year]  Subjective/Objective:            CM following for progression and d/c planning.        Action/Plan: IM given pt drowsy and sleeping, noted [facility] has been arranged. Noted plan to increase IV steroids today, may be able to plan for d/c tomorrow.   Expected Discharge Date:       06/11/2015           Expected Discharge Plan:  Home w Hospice Care  In-House Referral:  Hospice / Palliative Care  Discharge planning Services  CM Consult  Post Acute Care Choice:  Hospice Choice offered to:  Sibling  DME Arranged:    DME Agency:     HH Arranged:  RN HH Agency:  [facility]  Status of Service:  In process, will continue to follow  Medicare Important Message Given:    Date Medicare IM Given:    Medicare IM give by:    Date Additional Medicare IM Given:    Additional Medicare Important Message give by:     If discussed at Long Length of Stay Meetings, dates discussed:    Additional Comments:  Starlyn Skeans, RN 06/10/2015, 11:23 AM

## 2015-06-10 NOTE — Progress Notes (Signed)
ANTIBIOTIC CONSULT NOTE - FOLLOW UP  Pharmacy Consult for vancomycin/ceftazidime  Indication: PNA and chronic diastolic MRSA lung abscess  Allergies  Allergen Reactions  . Lipitor [Atorvastatin Calcium] Other (See Comments)    Muscle aches    Patient Measurements: Height: 5\' 11"  (180.3 cm) Weight: 176 lb 12.9 oz (80.2 kg) IBW/kg (Calculated) : 75.3  Vital Signs: Temp: 97.6 F (36.4 C) (06/21 1159) Temp Source: Oral (06/21 1159) BP: 101/60 mmHg (06/21 1159) Pulse Rate: 76 (06/21 1159) Intake/Output from previous day: 06/20 0701 - 06/21 0700 In: 1490 [P.O.:940; IV Piggyback:550] Out: 1330 [Urine:1330] Intake/Output from this shift:    Labs: No results for input(s): WBC, HGB, PLT, LABCREA, CREATININE in the last 72 hours. Estimated Creatinine Clearance: 78.4 mL/min (by C-G formula based on Cr of 1). No results for input(s): VANCOTROUGH, VANCOPEAK, VANCORANDOM, GENTTROUGH, GENTPEAK, GENTRANDOM, TOBRATROUGH, TOBRAPEAK, TOBRARND, AMIKACINPEAK, AMIKACINTROU, AMIKACIN in the last 72 hours.   Assessment: 14 YOM with recent admission (6/3 - 6/14), MRSA lung abscess - not a candidate for surgery or percutaneous drainage. He was treated with IV vancomycin in the hospital and discharged home on oral Bactrim. He was readmitted 6/18 with acute on chronic resp failure. He is now on D#4 vanc/cefotaz. afeb, WBC 11.2 on 6/18, PCT 0.33. Scr 1 on 6/18, which is higher than previous admission. No new BMET. UOP 0.78ml/kg/hr. Expected discharge to Hospice / Palliative Care soon.  Goal of Therapy:  Vancomycin trough level 15-20 mcg/ml  Plan:  Vancomycin 1g IV q12h Ceftazidime 2g IV q8h Vancomycin trough at 1630 before PM dose.  Bayard Hugger, PharmD, BCPS  Clinical Pharmacist  Pager: [phone]   06/10/2015,1:13 PM

## 2015-06-10 NOTE — Progress Notes (Signed)
Inpatient Diabetes Program Recommendations  AACE/ADA: New Consensus Statement on Inpatient Glycemic Control (2013)  Target Ranges:  Prepandial:   less than 140 mg/dL      Peak postprandial:   less than 180 mg/dL (1-2 hours)      Critically ill patients:  140 - 180 mg/dL  Results for MAGIC, KUHAR (MRN [id number]) as of 06/10/2015 09:18  Ref. Range 06/09/2015 08:11 06/09/2015 11:38 06/09/2015 16:48 06/09/2015 20:44 06/10/2015 08:08  Glucose-Capillary Latest Ref Range: 65-99 mg/dL 867 (H) 544 (H) 920 (H) 276 (H) 201 (H)   Reason for Admission: COPD Exacerbation  Diabetes history: DM 2 Outpatient Diabetes medications: lantus 6 units Daily, Metformin 500 mg BID (per patient, currently on hold) Current orders for Inpatient glycemic control: Lantus 6 units QHS, Novolog Moderate (0-15 units), Novolog 0-5 units QHS  Inpatient Diabetes Program Recommendations Insulin - Basal: Patient still receiving IV Solumedrol 60 mg Q12 hrs. Please consider increasing Lantus to 8 units QHS temporarily whil on IV steroids.    Thanks,  Christena Deem RN, MSN, [facility] Inpatient Diabetes Coordinator Team Pager [phone] (8AM-5PM)

## 2015-06-10 NOTE — Progress Notes (Signed)
PROGRESS NOTE  LEQUAN [name]:[id number] DOB: 02/13/1951 DOA: 06/07/2015 PCP: Jeanann Lewandowsky, MD  [name] is a 64 y.o. male with end stage COPD, on 3-4 L home oxygen, MRSA lung abscess, history of stroke, chronic back pain, diabetes, diastolic CHF, CAD with PCI was just discharged from [facility].  He was treated for COPD exacerbation and MRSA lung abscess. For his MRSA lung abscess he was seen by CT surgery who felt that he was not a candidate for surgery due to his advanced COPD, he was evaluated by pulmonary for percutaneous drainage. Pulmonary did not feel that he was a good candidate for percutaneous drainage due to risk of fistula and empyema. He was treated with IV vancomycin in the hospital and discharged home on oral Bactrim with plan for close pulmonary follow-up. He has been miserable since discharge, was seen in the ER at [facility] 2 days after discharge for shortness of breath and discharged home after breathing treatments.  Presents with worsening shortness of breath, cough productive of yellow sputum, subjective fevers and chills.  Assessment/Plan: Acute on chronic respiratory failure with hypoxia -Due to COPD exacerbation, chronic diastolic CHF and MRSA lung abscess -CT chest with some improvement -He was seen by CT surgery and not felt to be a candidate for surgery due to advanced COPD -Also followed by pulmonary and not felt to be a candidate for percutaneous drainage due to risk of fistula and empyema -Has been on oral Bactrim, changed to IV vancomycin at this time -Also add gram-negative coverage -DNR now   COPD exacerbation - antibiotic as noted above -IV Solu-Medrol-wean as tolerated, DuoNeb's, Mucinex -BiPAP as needed,    CAD -dual antiplatelet therapy and statin, no on BB due to severe COPD -troponin mildly elevated, cardiology last week recommended medical management for this   Chronic diastolic CHF (congestive heart failure) -  Continue Lasix PO- does not appear to volume overloaded   DM (diabetes mellitus) type II uncontrolled, periph vascular disorder -Continue Lantus, and sliding-scale   MRSA pneumonia with RUL lung abscess -As dictated above  Orthopantogram done as patient felt that his wisdom teeth were coming in- no sign of issues  Poor overall prognosis Plan is for patient to go home with hospice at home once stable  Code Status: dnr Family Communication: patient Disposition Plan: home with hospice once steroids weaned off   Consultants:    Procedures:  Palliative care consult    HPI/Subjective: Had a lot of SOB last PM And when working with PT  Objective: Filed Vitals:   06/10/15 0518  BP: 106/68  Pulse: 73  Temp: 97.7 F (36.5 C)  Resp: 18    Intake/Output Summary (Last 24 hours) at 06/10/15 0818 Last data filed at 06/10/15 0600  Gross per 24 hour  Intake   1490 ml  Output   1180 ml  Net    310 ml   Filed Weights   06/09/15 0029  Weight: 80.2 kg (176 lb 12.9 oz)    Exam:   General:  A+Ox3, NAD  Cardiovascular: rrr  Respiratory: diminished, +wheezing  Abdomen: +Bs, soft  Musculoskeletal: No edema, multiple areas of bruising  Data Reviewed: Basic Metabolic Panel:  Recent Labs Lab 06/05/15 1628 06/07/15 1309  NA 133* 135  K 4.1 3.5  CL 90* 87*  CO2 32 35*  GLUCOSE 218* 140*  BUN 27* 17  CREATININE 0.95 1.00  CALCIUM 8.6* 9.1   Liver Function Tests:  Recent Labs Lab 06/07/15  1309  AST 26  ALT 34  ALKPHOS 104  BILITOT 0.7  PROT 6.6  ALBUMIN 3.3*   No results for input(s): LIPASE, AMYLASE in the last 168 hours. No results for input(s): AMMONIA in the last 168 hours. CBC:  Recent Labs Lab 06/05/15 1628 06/07/15 1309  WBC 13.6* 11.2*  NEUTROABS  --  10.1*  HGB 9.8* 11.1*  HCT 31.2* 34.4*  MCV 89.1 88.7  PLT 189 181   Cardiac Enzymes:  Recent Labs Lab 06/07/15 1309 06/07/15 1721 06/07/15 2230  TROPONINI 0.06* 0.07* 0.06*    BNP (last 3 results)  Recent Labs  05/04/15 2318 05/24/15 0415 06/07/15 1309  BNP 136.0* 350.6* 102.5*    ProBNP (last 3 results)  Recent Labs  10/27/14 2311  PROBNP 89.0    CBG:  Recent Labs Lab 06/08/15 2207 06/09/15 0811 06/09/15 1138 06/09/15 1648 06/09/15 2044  GLUCAP 294* 221* 212* 181* 276*    Recent Results (from the past 240 hour(s))  Blood culture (routine x 2)     Status: None (Preliminary result)   Collection Time: 06/07/15  1:05 PM  Result Value Ref Range Status   Specimen Description BLOOD LEFT ANTECUBITAL  Final   Special Requests BOTTLES DRAWN AEROBIC AND ANAEROBIC 5CC  Final   Culture NO GROWTH 2 DAYS  Final   Report Status PENDING  Incomplete  Blood culture (routine x 2)     Status: None (Preliminary result)   Collection Time: 06/07/15  1:10 PM  Result Value Ref Range Status   Specimen Description BLOOD RIGHT HAND  Final   Special Requests BOTTLES DRAWN AEROBIC AND ANAEROBIC 5CC  Final   Culture NO GROWTH 2 DAYS  Final   Report Status PENDING  Incomplete  MRSA PCR Screening     Status: None   Collection Time: 06/07/15  7:13 PM  Result Value Ref Range Status   MRSA by PCR NEGATIVE NEGATIVE Final    Comment:        The GeneXpert MRSA Assay (FDA approved for NASAL specimens only), is one component of a comprehensive MRSA colonization surveillance program. It is not intended to diagnose MRSA infection nor to guide or monitor treatment for MRSA infections.      Studies: Dg Orthopantogram  06/08/2015   CLINICAL DATA:  Left dental pain  EXAM: ORTHOPANTOGRAM/PANORAMIC  COMPARISON:  None.  FINDINGS: The patient is edentulous. No gross evidence for displaced fracture or gross erosion.  IMPRESSION: No gross acute abnormality identified. Outpatient dedicated dental radiographs and consultation recommended.   Electronically Signed   By: Christiana Pellant M.D.   On: 06/08/2015 14:17    Scheduled Meds: . antiseptic oral rinse  7 mL Mouth Rinse  BID  . aspirin  325 mg Oral Daily  . bisoprolol  5 mg Oral BID  . cefTAZidime (FORTAZ)  IV  2 g Intravenous Q8H  . clopidogrel  75 mg Oral Daily  . docusate sodium  100 mg Oral BID  . enoxaparin (LOVENOX) injection  40 mg Subcutaneous Q24H  . fluticasone  2 spray Each Nare BID  . furosemide  60 mg Oral BID  . guaiFENesin  600 mg Oral BID  . insulin aspart  0-15 Units Subcutaneous TID WC  . insulin aspart  0-5 Units Subcutaneous QHS  . insulin glargine  6 Units Subcutaneous QHS  . ipratropium-albuterol  3 mL Nebulization Q4H  . methylPREDNISolone (SOLU-MEDROL) injection  60 mg Intravenous Q12H  . mometasone-formoterol  2 puff Inhalation BID  .  pantoprazole  40 mg Oral Daily  . potassium chloride SA  20 mEq Oral Daily  . pravastatin  20 mg Oral q1800  . vancomycin  1,000 mg Intravenous Q12H   Continuous Infusions:  Antibiotics Given (last 72 hours)    Date/Time Action Medication Dose Rate   06/07/15 2015 Given   vancomycin (VANCOCIN) IVPB 1000 mg/200 mL premix 1,000 mg 200 mL/hr   06/08/15 0505 Given   vancomycin (VANCOCIN) IVPB 1000 mg/200 mL premix 1,000 mg 200 mL/hr   06/08/15 1735 Given   vancomycin (VANCOCIN) IVPB 1000 mg/200 mL premix 1,000 mg 200 mL/hr   06/09/15 0424 Given   vancomycin (VANCOCIN) IVPB 1000 mg/200 mL premix 1,000 mg 200 mL/hr   06/09/15 1755 Given   vancomycin (VANCOCIN) IVPB 1000 mg/200 mL premix 1,000 mg 200 mL/hr   06/10/15 0418 Given   vancomycin (VANCOCIN) IVPB 1000 mg/200 mL premix 1,000 mg 200 mL/hr      Active Problems:   Acute on chronic respiratory failure with hypoxia   COPD (chronic obstructive pulmonary disease)   COPD exacerbation   OSA (obstructive sleep apnea)   Acute on chronic diastolic CHF (congestive heart failure)   DM (diabetes mellitus) type II uncontrolled, periph vascular disorder   MRSA pneumonia with RUL lung abscess   Chronic respiratory failure with hypercapnia   Encounter for palliative care    Time spent: 25  min    [facility], Rossie Bretado  [facility] Pager [phone]. If 7PM-7AM, please contact night-coverage at [email or web address], password [facility] 06/10/2015, 8:18 AM  LOS: 3 days

## 2015-06-10 NOTE — Progress Notes (Signed)
Daily Progress Note   Patient Name: [name]       Date: 06/10/2015 DOB: 1951/10/10  Age: 64 y.o. MRN#: [id number] Attending Physician: Joseph Art, DO Primary Care Physician: Jeanann Lewandowsky, MD Admit Date: 06/07/2015  Reason for Consultation/Follow-up: Establishing goals of care  Subjective:  resting in bed, work of breathing improved.   Interval Events: LBM 6-17: patient states he has a BM every 4-5 days, he does not want to take rectal medications. Continue HS Sennokot and monitor.  Patient agreeable to home with hospice on D/C, wants to know if he is going home in am. Patient continues to complain on pain on the side of his tongue from his jaw, he believes he has wisdom tooth coming out, he does not have teeth, he wears dentures in lower jaw. Will add orajel ointment for symptom relief.  HCPOA completed.  Appreciate [facility] assistance.   Length of Stay: 3 days  Current Medications: Scheduled Meds:  . antiseptic oral rinse  7 mL Mouth Rinse BID  . aspirin  325 mg Oral Daily  . bisoprolol  5 mg Oral BID  . cefTAZidime (FORTAZ)  IV  2 g Intravenous Q8H  . clopidogrel  75 mg Oral Daily  . enoxaparin (LOVENOX) injection  40 mg Subcutaneous Q24H  . fluticasone  2 spray Each Nare BID  . furosemide  60 mg Oral BID  . guaiFENesin  600 mg Oral BID  . insulin aspart  0-15 Units Subcutaneous TID WC  . insulin aspart  0-5 Units Subcutaneous QHS  . insulin glargine  6 Units Subcutaneous QHS  . ipratropium-albuterol  3 mL Nebulization Q4H  . methylPREDNISolone (SOLU-MEDROL) injection  60 mg Intravenous Q12H  . mometasone-formoterol  2 puff Inhalation BID  . pantoprazole  40 mg Oral Daily  . potassium chloride SA  20 mEq Oral Daily  . pravastatin  20 mg Oral q1800  . senna-docusate  1 tablet Oral QHS  . vancomycin  1,000 mg Intravenous Q12H    Continuous Infusions:    PRN Meds: acetaminophen, albuterol, ALPRAZolam, HYDROcodone-acetaminophen, morphine injection,  ondansetron **OR** ondansetron (ZOFRAN) IV, traMADol  Palliative Performance Scale: 40%     Vital Signs: BP 101/60 mmHg  Pulse 76  Temp(Src) 97.6 F (36.4 C) (Oral)  Resp 18  Ht  (1.803 m)  Wt 80.2 kg (176 lb 12.9 oz)  BMI 24.67 kg/m2  SpO2 98% SpO2: SpO2: 98 % O2 Device: O2 Device: Nasal Cannula O2 Flow Rate: O2 Flow Rate (L/min): 4 L/min  Intake/output summary:  Intake/Output Summary (Last 24 hours) at 06/10/15 1503 Last data filed at 06/10/15 0600  Gross per 24 hour  Intake    790 ml  Output    800 ml  Net    -10 ml   LBM:   Baseline Weight: Weight: 80.2 kg (176 lb 12.9 oz) Most recent weight: Weight: 80.2 kg (176 lb 12.9 oz)  Physical Exam:       NAD Regular S1 S2 Few wheezes Abdomen soft Bruises UE Non focal      Additional Data Reviewed: No results for input(s): WBC, HGB, PLT, NA, BUN, CREATININE, ALB in the last 72 hours.   Problem List:  Patient Active Problem List   Diagnosis Date Noted  . Encounter for palliative care   . NSTEMI (non-ST elevated myocardial infarction)   . Chronic respiratory failure with hypercapnia 05/25/2015  . Sepsis 05/23/2015  . MRSA pneumonia with RUL lung abscess 05/19/2015  . Lung  abscess RUL 05/17/2015  . Pressure ulcer 05/15/2015  . Necrotizing pneumonia   . Hypoxemia   . Esophageal reflux   . Acute on chronic respiratory failure 05/12/2015  . HCAP (healthcare-associated pneumonia) 05/12/2015  . DM (diabetes mellitus) type II uncontrolled, periph vascular disorder   . Acute on chronic diastolic CHF (congestive heart failure) 05/05/2015  . Weakness of both legs 05/05/2015  . History of CHF (congestive heart failure)   . Other chest pain   . OSA (obstructive sleep apnea) 10/31/2014  . COPD exacerbation   . Respiratory distress   . Acute exacerbation of chronic obstructive pulmonary disease (COPD) 10/28/2014  . Bilateral lower extremity edema 10/28/2014  . Acute hyperglycemia 10/28/2014  . Chest pain  01/04/2014  . COPD (chronic obstructive pulmonary disease) 08/26/2013  . Acute on chronic respiratory failure with hypoxia 02/07/2013  . TOBACCO USER 02/11/2010  . Essential hypertension 08/07/2009  . Coronary atherosclerosis 08/07/2009     Palliative Care Assessment & Plan    Code Status:  DNR  Goals of Care:   continue PRN IV Morphine, hospice to assess for PO PRN Roxanol needs after patient's discharge.   Desire for further Chaplaincy support:no  3. Symptom Management:   dyspnea: morphine PRN  4. Palliative Prophylaxis:  Stool Softener: yes  5. Prognosis: weeks to some limited number of months.   5. Discharge Planning: Home with Hospice   Care plan was discussed with  Patient and bedside RN.   Thank you for allowing the Palliative Medicine Team to assist in the care of this patient.   Time In: 1400 Time Out: 1425 Total Time 25 Prolonged Time Billed  no     Greater than 50%  of this time was spent counseling and coordinating care related to the above assessment and plan.   Rosalin Hawking, MD  06/10/2015, 3:03 PM  Please contact Palliative Medicine Team phone at [phone] for questions and concerns.

## 2015-06-10 NOTE — Care Management Note (Signed)
Case Management Note  Patient Details  Name: [name] MRN: [id number] Date of Birth: [birth year]  Subjective/Objective:            CM following for progression and d/c planning.        Action/Plan: IV steroids adjusted, may be able to d/c to home  With Hospice services tomorrow 06/11/2015.   Expected Discharge Date:       06/11/2015           Expected Discharge Plan:  Home w Hospice Care  In-House Referral:  Hospice / Palliative Care  Discharge planning Services  CM Consult  Post Acute Care Choice:  Hospice Choice offered to:  Sibling  DME Arranged:    DME Agency:     HH Arranged:  RN HH Agency:  [name]  Status of Service:  In process, will continue to follow  Medicare Important Message Given:   Yes Date Medicare IM Given:   06/10/2015 Medicare IM give by:   Johny Shock RN MPH, case manager, [phone] Date Additional Medicare IM Given:    Additional Medicare Important Message give by:     If discussed at Long Length of Stay Meetings, dates discussed:    Additional Comments:  Starlyn Skeans, RN 06/10/2015, 11:25 AM

## 2015-06-11 ENCOUNTER — Inpatient Hospital Stay: Payer: Medicare Other | Admitting: Family Medicine

## 2015-06-11 DIAGNOSIS — J9621 Acute and chronic respiratory failure with hypoxia: Secondary | ICD-10-CM

## 2015-06-11 LAB — GLUCOSE, CAPILLARY
GLUCOSE-CAPILLARY: 243 mg/dL — AB (ref 65–99)
Glucose-Capillary: 215 mg/dL — ABNORMAL HIGH (ref 65–99)
Glucose-Capillary: 250 mg/dL — ABNORMAL HIGH (ref 65–99)
Glucose-Capillary: 311 mg/dL — ABNORMAL HIGH (ref 65–99)

## 2015-06-11 MED ORDER — PREDNISONE 50 MG PO TABS
60.0000 mg | ORAL_TABLET | Freq: Two times a day (BID) | ORAL | Status: DC
  Administered 2015-06-12: 60 mg via ORAL
  Filled 2015-06-11 (×3): qty 1

## 2015-06-11 NOTE — Progress Notes (Signed)
Physical Therapy Treatment Patient Details Name: [name] MRN: [id number] DOB: [birth year] Today's Date: 06/11/2015    History of Present Illness [name] is a 64 y.o. male with end stage COPD, on 3-4 L home oxygen, MRSA lung abscess, history of stroke, chronic back pain, diabetes, diastolic CHF, CAD with PCI was just discharged from [facility] 4 days ago.Readmit with SOB, fever and chills.  COPD exacerbation.    PT Comments    [name] reports is very tired today and requested not to get OOB; We discussed dc plans, going home with his sister, I believe, and the equipment he has at home; HHPT remains appropriate for follow-up, however will of course take patient's and Hospice's lead;   Worth considering ambulance transport at time of dc;   He did agree to therex as noted below    Follow Up Recommendations  Home health PT;Supervision/Assistance - 24 hour     Equipment Recommendations  None recommended by PT (pretty well-equipped)    Recommendations for Other Services       Precautions / Restrictions Precautions Precautions: Fall Precaution Comments: go slowly to manage anxiety  Restrictions Weight Bearing Restrictions: No    Mobility  Bed Mobility                  Transfers                    Ambulation/Gait                 Stairs            Wheelchair Mobility    Modified Rankin (Stroke Patients Only)       Balance                                    Cognition Arousal/Alertness: Awake/alert Behavior During Therapy: WFL for tasks assessed/performed Overall Cognitive Status: Within Functional Limits for tasks assessed                      Exercises General Exercises - Lower Extremity Ankle Circles/Pumps: AROM;Both;20 reps;Supine Quad Sets: AROM;Both;10 reps;Supine Short Arc Quad: AROM;Both;10 reps;Supine Heel Slides: AROM;Both;10 reps;Supine (Alternating) Supported bridging, 5  reps    General Comments        Pertinent Vitals/Pain Pain Assessment: No/denies pain    Home Living                      Prior Function            PT Goals (current goals can now be found in the care plan section) Acute Rehab PT Goals Patient Stated Goal: go home PT Goal Formulation: With patient Time For Goal Achievement: 06/23/15 Potential to Achieve Goals: Fair Progress towards PT goals: Progressing toward goals    Frequency  Min 3X/week    PT Plan Current plan remains appropriate    Co-evaluation             End of Session Equipment Utilized During Treatment: Oxygen Activity Tolerance: Patient limited by fatigue Patient left: in bed;with call bell/phone within reach     Time: 1012-1032 PT Time Calculation (min) (ACUTE ONLY): 20 min  Charges:  $Therapeutic Exercise: 8-22 mins                    G Codes:  Van Clines Hamff 06/11/2015, 1:32 PM  Van Clines, Onward  Acute Rehabilitation Services Pager [phone] Office [phone]

## 2015-06-11 NOTE — Progress Notes (Signed)
Patient sitting up in bed eating oatmeal this morning. He reports he has not seen his MD today and thinks he needs to stay longer to receive more doses of IV Solumedrol. Pt's staff nurse arrived and pt. requested pain medicine for his back. Pt. states that the equipment has been delivered to the house.  DNR form placed on front of chart to be completed. [facility] Liaison will continue to follow. Chart and patient information reviewed with Dr. Berlinda Last [facility] Medical Director and hospice eligibility confirmed. Please call with any hospice concerns.  Sharen Heck RN [facility] Liaison [phone]

## 2015-06-11 NOTE — Progress Notes (Signed)
PROGRESS NOTE  [name] TDS:[id number] DOB: 07/26/1951 DOA: 06/07/2015 PCP: Jeanann Lewandowsky, MD  [name] is a 64 y.o. male with end stage COPD, on 3-4 [name] home oxygen, MRSA lung abscess, history of stroke, chronic back pain, diabetes, diastolic CHF, CAD with PCI was just discharged from [facility].  He was treated for COPD exacerbation and MRSA lung abscess. For his MRSA lung abscess he was seen by CT surgery who felt that he was not a candidate for surgery due to his advanced COPD, he was evaluated by pulmonary for percutaneous drainage. Pulmonary did not feel that he was a good candidate for percutaneous drainage due to risk of fistula and empyema. He was treated with IV vancomycin in the hospital and discharged home on oral Bactrim with plan for close pulmonary follow-up. He has been miserable since discharge, was seen in the ER at [facility] long 2 days after discharge for shortness of breath and discharged home after breathing treatments.  Presents with worsening shortness of breath, cough productive of yellow sputum, subjective fevers and chills.  Assessment/Plan: Acute on chronic respiratory failure with hypoxia Home with hospice tomorrow   COPD exacerbation Change to po stroids    CAD -dual antiplatelet therapy and statin, no on BB due to severe COPD -troponin mildly elevated, cardiology last week recommended medical management for this   Chronic diastolic CHF (congestive heart failure) - Continue Lasix PO- does not appear to volume overloaded   DM (diabetes mellitus) type II uncontrolled, periph vascular disorder -Continue Lantus, and sliding-scale   MRSA pneumonia with RUL lung abscess On abx  Poor overall prognosis Plan is for patient to go home with hospice at home once stable  Consult  Palliative care consult    HPI/Subjective: Feels a little better. Wants to continue IV solumedrol as long as possible  Objective: Filed Vitals:   06/11/15 2135  BP: 137/74  Pulse: 87  Temp: 97.9 F (36.6 C)  Resp: 13    Intake/Output Summary (Last 24 hours) at 06/11/15 2207 Last data filed at 06/11/15 1930  Gross per 24 hour  Intake   1510 ml  Output   1925 ml  Net   -415 ml   Filed Weights   06/09/15 0029 06/10/15 2110 06/11/15 2135  Weight: 80.2 kg (176 lb 12.9 oz) 80.695 kg (177 lb 14.4 oz) 81.647 kg (180 lb)    Exam:   General:  A+Ox3, NAD. talkative  Cardiovascular: rrr  Respiratory: diminished, +wheezing  Abdomen: +Bs, soft  Musculoskeletal: No edema, multiple areas of bruising  Data Reviewed: Basic Metabolic Panel:  Recent Labs Lab 06/05/15 1628 06/07/15 1309 06/10/15 1620  NA 133* 135 137  K 4.1 3.5 3.8  CL 90* 87* 89*  CO2 32 35* 38*  GLUCOSE 218* 140* 220*  BUN 27* 17 13  CREATININE 0.95 1.00 0.58*  CALCIUM 8.6* 9.1 8.4*   Liver Function Tests:  Recent Labs Lab 06/07/15 1309  AST 26  ALT 34  ALKPHOS 104  BILITOT 0.7  PROT 6.6  ALBUMIN 3.3*   No results for input(s): LIPASE, AMYLASE in the last 168 hours. No results for input(s): AMMONIA in the last 168 hours. CBC:  Recent Labs Lab 06/05/15 1628 06/07/15 1309  WBC 13.6* 11.2*  NEUTROABS  --  10.1*  HGB 9.8* 11.1*  HCT 31.2* 34.4*  MCV 89.1 88.7  PLT 189 181   Cardiac Enzymes:  Recent Labs Lab 06/07/15 1309 06/07/15 1721 06/07/15 2230  TROPONINI 0.06* 0.07*  0.06*   BNP (last 3 results)  Recent Labs  05/04/15 2318 05/24/15 0415 06/07/15 1309  BNP 136.0* 350.6* 102.5*    ProBNP (last 3 results)  Recent Labs  10/27/14 2311  PROBNP 89.0    CBG:  Recent Labs Lab 06/10/15 2114 06/11/15 0743 06/11/15 1144 06/11/15 1642 06/11/15 2139  GLUCAP 252* 215* 250* 243* 311*    Recent Results (from the past 240 hour(s))  Blood culture (routine x 2)     Status: None (Preliminary result)   Collection Time: 06/07/15  1:05 PM  Result Value Ref Range Status   Specimen Description BLOOD LEFT ANTECUBITAL   Final   Special Requests BOTTLES DRAWN AEROBIC AND ANAEROBIC 5CC  Final   Culture NO GROWTH 4 DAYS  Final   Report Status PENDING  Incomplete  Blood culture (routine x 2)     Status: None (Preliminary result)   Collection Time: 06/07/15  1:10 PM  Result Value Ref Range Status   Specimen Description BLOOD RIGHT HAND  Final   Special Requests BOTTLES DRAWN AEROBIC AND ANAEROBIC 5CC  Final   Culture NO GROWTH 4 DAYS  Final   Report Status PENDING  Incomplete  MRSA PCR Screening     Status: None   Collection Time: 06/07/15  7:13 PM  Result Value Ref Range Status   MRSA by PCR NEGATIVE NEGATIVE Final    Comment:        The GeneXpert MRSA Assay (FDA approved for NASAL specimens only), is one component of a comprehensive MRSA colonization surveillance program. It is not intended to diagnose MRSA infection nor to guide or monitor treatment for MRSA infections.      Studies: No results found.  Scheduled Meds: . antiseptic oral rinse  7 mL Mouth Rinse BID  . aspirin  325 mg Oral Daily  . bisoprolol  5 mg Oral BID  . cefTAZidime (FORTAZ)  IV  2 g Intravenous Q8H  . clopidogrel  75 mg Oral Daily  . enoxaparin (LOVENOX) injection  40 mg Subcutaneous Q24H  . fluticasone  2 spray Each Nare BID  . furosemide  60 mg Oral BID  . guaiFENesin  600 mg Oral BID  . insulin aspart  0-15 Units Subcutaneous TID WC  . insulin aspart  0-5 Units Subcutaneous QHS  . insulin glargine  6 Units Subcutaneous QHS  . ipratropium-albuterol  3 mL Nebulization Q4H  . mometasone-formoterol  2 puff Inhalation BID  . pantoprazole  40 mg Oral Daily  . potassium chloride SA  20 mEq Oral Daily  . pravastatin  20 mg Oral q1800  . [START ON 06/12/2015] predniSONE  60 mg Oral BID WC  . senna-docusate  1 tablet Oral QHS  . vancomycin  1,000 mg Intravenous Q12H   Continuous Infusions:  Antibiotics Given (last 72 hours)    Date/Time Action Medication Dose Rate   06/09/15 0424 Given   vancomycin (VANCOCIN) IVPB  1000 mg/200 mL premix 1,000 mg 200 mL/hr   06/09/15 1755 Given   vancomycin (VANCOCIN) IVPB 1000 mg/200 mL premix 1,000 mg 200 mL/hr   06/10/15 0418 Given   vancomycin (VANCOCIN) IVPB 1000 mg/200 mL premix 1,000 mg 200 mL/hr   06/10/15 1739 Given   vancomycin (VANCOCIN) IVPB 1000 mg/200 mL premix 1,000 mg 200 mL/hr   06/11/15 0520 Given   vancomycin (VANCOCIN) IVPB 1000 mg/200 mL premix 1,000 mg 200 mL/hr   06/11/15 1742 Given   vancomycin (VANCOCIN) IVPB 1000 mg/200 mL premix 1,000 mg 200  mL/hr     Time spent: 25 min  [name]  [facility] [email or web address], password [facility] 06/11/2015, 10:07 PM  LOS: 4 days

## 2015-06-12 ENCOUNTER — Ambulatory Visit: Payer: Medicare Other | Admitting: Emergency Medicine

## 2015-06-12 ENCOUNTER — Telehealth: Payer: Self-pay | Admitting: Emergency Medicine

## 2015-06-12 DIAGNOSIS — J852 Abscess of lung without pneumonia: Secondary | ICD-10-CM

## 2015-06-12 DIAGNOSIS — J441 Chronic obstructive pulmonary disease with (acute) exacerbation: Secondary | ICD-10-CM

## 2015-06-12 LAB — CULTURE, BLOOD (ROUTINE X 2)
CULTURE: NO GROWTH
Culture: NO GROWTH

## 2015-06-12 LAB — GLUCOSE, CAPILLARY
Glucose-Capillary: 147 mg/dL — ABNORMAL HIGH (ref 65–99)
Glucose-Capillary: 216 mg/dL — ABNORMAL HIGH (ref 65–99)

## 2015-06-12 MED ORDER — MORPHINE SULFATE (CONCENTRATE) 20 MG/ML PO SOLN: 10.0000 mg | ORAL | Status: AC | PRN

## 2015-06-12 NOTE — Telephone Encounter (Signed)
[name]   After 5 call [phone]

## 2015-06-12 NOTE — Telephone Encounter (Signed)
Yes- this is okay per RB and okay hospice docs to assist with symptom management Thanks

## 2015-06-12 NOTE — Care Management Note (Signed)
Case Management Note  Patient Details  Name: [name] MRN: [id number] Date of Birth: 09-21-1951  Subjective/Objective:        CM following for progression and d/c planning.            Action/Plan:  Hospice services and DME in place, Hospice notified of plan to d/c this pt to home today.  Expected Discharge Date:      06/12/2015            Expected Discharge Plan:  Home w Hospice Care  In-House Referral:  Hospice / Palliative Care  Discharge planning Services  CM Consult  Post Acute Care Choice:  Hospice Choice offered to:  Sibling  DME Arranged:    DME Agency:     HH Arranged:  RN HH Agency:  [facility]  Status of Service:  Completed, signed off  Medicare Important Message Given:  Yes Date Medicare IM Given:  06/10/15 Medicare IM give by:  Johny Shock RN MPH, case manager, [phone] Date Additional Medicare IM Given:    Additional Medicare Important Message give by:     If discussed at Long Length of Stay Meetings, dates discussed:    Additional Comments:  All DME and HH services arranged by [facility].  Everrett Lacasse, Annamarie Major, RN 06/12/2015, 3:19 PM

## 2015-06-12 NOTE — Discharge Summary (Signed)
Physician Discharge Summary  CHRSTOPHER MALENFANT WUJ:[id number] DOB: 1951-11-21 DOA: 06/07/2015  PCP: Jeanann Lewandowsky, MD  Admit date: 06/07/2015 Discharge date: 06/12/2015  Time spent: greater than 30 minutes  Recommendations for Outpatient Follow-up:   Discharge Diagnoses:  Active Problems:   Acute on chronic respiratory failure with hypoxia   COPD (chronic obstructive pulmonary disease)   COPD exacerbation   OSA (obstructive sleep apnea)   Acute on chronic diastolic CHF (congestive heart failure)   DM (diabetes mellitus) type II uncontrolled, periph vascular disorder   MRSA pneumonia with RUL lung abscess   Chronic respiratory failure with hypercapnia   Encounter for palliative care   Discharge Condition: stable  Diet recommendation: diabetic heart healthy  Filed Weights   06/09/15 0029 06/10/15 2110 06/11/15 2135  Weight: 80.2 kg (176 lb 12.9 oz) 80.695 kg (177 lb 14.4 oz) 81.647 kg (180 lb)    History of present illness/Hospital Course:  EVART MCDONNELL is a 64 y.o. male with end stage COPD, on 3-4 L home oxygen, MRSA lung abscess, history of stroke, chronic back pain, diabetes, diastolic CHF, CAD with PCI was just discharged from [facility]. He was treated for COPD exacerbation and MRSA lung abscess. For his MRSA lung abscess he was seen by CT surgery who felt that he was not a candidate for surgery due to his advanced COPD, he was evaluated by pulmonary for percutaneous drainage. Pulmonary did not feel that he was a good candidate for percutaneous drainage due to risk of fistula and empyema. He was treated with IV vancomycin in the hospital and discharged home on oral Bactrim with plan for close pulmonary follow-up. He has been miserable since discharge, was seen in the ER at [facility] long 2 days after discharge for shortness of breath and discharged home after breathing treatments. Presents with worsening shortness of breath, cough productive of yellow sputum,  subjective fevers and chills.  Acute on chronic respiratory failure with hypoxia Transitioned to comfort care. Will go home with hospice.   COPD exacerbation Treated with steroids, oxygen, bronchodilators   CAD -troponin mildly elevated, cardiology last week recommended medical management for this   Chronic diastolic CHF (congestive heart failure) euvolemic at discharge   DM (diabetes mellitus) type II uncontrolled, periph vascular disorder -Continue Lantus, and sliding-scale   MRSA pneumonia with RUL lung abscess On abx  Poor overall prognosis Home with hospice.  Consult  Palliative care consult    Procedures:  none  Discharge Exam: Filed Vitals:   06/12/15 0532  BP: 119/80  Pulse: 79  Temp: 97.8 F (36.6 C)  Resp: 13    General:a and o Cardiovascular: RRR Respiratory: CTA without WRR  Discharge Instructions   Discharge Instructions    Activity as tolerated - No restrictions    Complete by:  As directed      Diet general    Complete by:  As directed           Current Discharge Medication List    START taking these medications   Details  morphine (ROXANOL) 20 MG/ML concentrated solution Take 0.5 mLs (10 mg total) by mouth every 3 (three) hours as needed for moderate pain, anxiety or shortness of breath. Qty: 30 mL, Refills: 0      CONTINUE these medications which have NOT CHANGED   Details  acetaminophen (TYLENOL) 325 MG tablet Take 2 tablets (650 mg total) by mouth every 6 (six) hours as needed for mild pain (or Fever >/= 101). Qty: 30  tablet, Refills: 0    albuterol (PROVENTIL HFA;VENTOLIN HFA) 108 (90 BASE) MCG/ACT inhaler Inhale 2 puffs into the lungs every 4 (four) hours as needed for wheezing or shortness of breath. Qty: 1 Inhaler, Refills: 11    albuterol (PROVENTIL) (2.5 MG/3ML) 0.083% nebulizer solution Take 3 mLs (2.5 mg total) by nebulization every 6 (six) hours as needed for wheezing or shortness of breath. Dx 496 Qty: 360  mL, Refills: 11    aspirin 325 MG tablet Take 325 mg by mouth daily.    clopidogrel (PLAVIX) 75 MG tablet Take 1 tablet (75 mg total) by mouth daily. Qty: 31 tablet, Refills: 3    fluticasone (FLONASE) 50 MCG/ACT nasal spray Place 2 sprays into both nostrils 2 (two) times daily. Qty: 16 g, Refills: 2    furosemide (LASIX) 20 MG tablet Take 3 tablets (60 mg total) by mouth 2 (two) times daily. Qty: 120 tablet, Refills: 1    insulin glargine (LANTUS) 100 unit/mL SOPN Inject 6 Units into the skin daily.     ipratropium (ATROVENT) 0.02 % nebulizer solution Take 2.5 mLs (0.5 mg total) by nebulization every 4 (four) hours as needed for wheezing or shortness of breath. Qty: 300 mL, Refills: 5    mometasone-formoterol (DULERA) 100-5 MCG/ACT AERO Inhale 2 puffs into the lungs 2 (two) times daily. Qty: 3 Inhaler, Refills: 3    pantoprazole (PROTONIX) 40 MG tablet Take 1 tablet (40 mg total) by mouth 2 (two) times daily. Qty: 30 tablet, Refills: 3    potassium chloride SA (K-DUR,KLOR-CON) 20 MEQ tablet Take 1 tablet (20 mEq total) by mouth daily. Qty: 30 tablet, Refills: 3    predniSONE (DELTASONE) 10 MG tablet Take 5 tablets daily for 3 days then 3 tablet daily Qty: 30 tablet, Refills: 0    sulfamethoxazole-trimethoprim (BACTRIM DS,SEPTRA DS) 800-160 MG per tablet Take 1 tablet by mouth every 12 (twelve) hours. Qty: 20 tablet, Refills: 0    ALPRAZolam (XANAX) 0.25 MG tablet Take 1 tablet (0.25 mg total) by mouth 2 (two) times daily as needed for anxiety. Qty: 30 tablet, Refills: 0    bisoprolol (ZEBETA) 5 MG tablet Take 1 tablet (5 mg total) by mouth 2 (two) times daily. Qty: 60 tablet, Refills: 1      STOP taking these medications     pravastatin (PRAVACHOL) 20 MG tablet      traMADol (ULTRAM) 50 MG tablet      HYDROcodone-acetaminophen (NORCO) 5-325 MG per tablet      metFORMIN (GLUCOPHAGE) 500 MG tablet        Allergies  Allergen Reactions  . Lipitor [Atorvastatin  Calcium] Other (See Comments)    Muscle aches      The results of significant diagnostics from this hospitalization (including imaging, microbiology, ancillary and laboratory) are listed below for reference.    Significant Diagnostic Studies: Dg Orthopantogram  06/08/2015   CLINICAL DATA:  Left dental pain  EXAM: ORTHOPANTOGRAM/PANORAMIC  COMPARISON:  None.  FINDINGS: The patient is edentulous. No gross evidence for displaced fracture or gross erosion.  IMPRESSION: No gross acute abnormality identified. Outpatient dedicated dental radiographs and consultation recommended.   Electronically Signed   By: Christiana Pellant M.D.   On: 06/08/2015 14:17   Dg Chest 2 View  06/05/2015   CLINICAL DATA:  Increasing shortness of Breath  EXAM: CHEST - 2 VIEW  COMPARISON:  05/27/2015  FINDINGS: Cardiac shadow is within normal limits. The known cavitary lesion in the right upper lobe is again  identified and appears relatively stable from previous CT examination. An air-fluid level is noted within. No sizable infiltrate or effusion is noted. No bony abnormality is seen.  IMPRESSION: Stable appearance of the right upper lobe cavitary lesion. No acute abnormality is noted.   Electronically Signed   By: Alcide Clever M.D.   On: 06/05/2015 17:39   Dg Chest 2 View  05/15/2015   CLINICAL DATA:  Pneumonia and right upper lobe abscess.  EXAM: CHEST - 2 VIEW  COMPARISON:  05/14/2015  FINDINGS: The anterior right upper lobe abscess containing an air-fluid level likely has enlarged with maximum diameter approaching 10 cm in the lateral projection. There is more fluid in the abscess with air-fluid level visualized dependently. This abscess developed quite quickly and was not present on 05/04/2015. Followup CT of the chest would be helpful for further characterization, as there may be a component of bronchopleural fistula or overt communication with a bronchus. No pneumothorax is identified.  No pulmonary edema or significant  pleural fluid identified. The heart size and mediastinal contours remain normal.  IMPRESSION: Enlarging right upper lobe pulmonary abscess. Followup CT of the chest would be helpful for further characterization, as there may be an overt communication with a bronchus or component of bronchopleural fistula.   Electronically Signed   By: Irish Lack M.D.   On: 05/15/2015 08:09   Dg Chest 2 View  05/14/2015   CLINICAL DATA:  Chest pain and cough.  EXAM: CHEST  2 VIEW  COMPARISON:  05/12/2015.  05/04/2015.  CT 03/20/2015 .  FINDINGS: Left PICC line stable position. Mediastinum hilar structures are normal. Cavitary lesion containing a small amount of fluid is noted in the right upper lobe. This could represent fluid within a bullae secondary to infection. Developing abscess cannot be excluded. No pleural effusion or pneumothorax. Stable cardiomegaly with normal pulmonary vascularity. Diffuse multilevel degenerative change with multiple compression fractures again noted.  IMPRESSION: 1.  Left PICC line stable position.  2. Right upper lobe bullae containing fluid, possibly secondary to infection. Developing pulmonary abscess cannot be excluded .   Electronically Signed   By: Maisie Fus  Register   On: 05/14/2015 08:24   Dg Thoracic Spine 2 View  05/15/2015   CLINICAL DATA:  Diffuse back pain following a recent fall. History of chronic mid back pain.  EXAM: THORACIC SPINE - 2 VIEW  COMPARISON:  Chest CT obtained earlier today.  FINDINGS: Previously noted multiple chronic thoracic and upper lumbar vertebral compression deformities. No acute fractures or subluxations seen. One of the lower thoracic vertebral compression deformities has a linear vacuum phenomenon beneath the superior endplate, unchanged from the CT earlier. Left PICC tip in the inferior aspect of the superior vena cava. Thoracic and upper lumbar spine degenerative changes. The previously noted right upper lobe cavitary lesion is again demonstrated.   IMPRESSION: 1. Stable multiple thoracic and upper lumbar vertebral compression deformities compared to the CT earlier today. These were also previously compared to previous radiographs and shown to be stable. 2. Left PICC tip in the inferior aspect of the superior vena cava. If a position at the cavoatrial junction is desired, it would be recommended that this be advanced 2 cm. 3. Stable right upper lobe cavitary lesion.   Electronically Signed   By: Beckie Salts M.D.   On: 05/15/2015 21:41   Dg Sacrum/coccyx  06/05/2015   CLINICAL DATA:  64 year old male with history of weakness. Fall onto the buttock region today complaining of pain in  the tailbone region.  EXAM: SACRUM AND COCCYX - 2+ VIEW  COMPARISON:  04/22/2015.  PICC screw  FINDINGS: There is no evidence of fracture or other focal bone lesions. Advanced degenerative changes of osteoarthritis are noted in the hip joints bilaterally (right much greater than left).  IMPRESSION: 1. No acute abnormality of the sacrum or coccyx.   Electronically Signed   By: Trudie Reed M.D.   On: 06/05/2015 17:38   Ct Chest W Contrast  06/07/2015   CLINICAL DATA:  64 year old male with shortness of breath. Right upper lobe pulmonary abscess. Subsequent encounter. COPD.  EXAM: CT CHEST WITH CONTRAST  TECHNIQUE: Multidetector CT imaging of the chest was performed during intravenous contrast administration.  CONTRAST:  75mL OMNIPAQUE IOHEXOL 300 MG/ML  SOLN  COMPARISON:  Chest radiographs 1310 hours today and earlier. Chest CT 05/15/2015  FINDINGS: Regressed but unresolved medial right upper lobe fluid containing cavitary lesion, now measuring 52 x 62 mm (versus 67 x 85 mm previously). Fairly thin wall with surrounding patchy and nodular airspace opacity as before. Ventilation in the region has mildly improved overall.  Major airways are patent. The upper lobe predominant centrilobular emphysema with mild septal thickening is stable. No pleural effusions. Left lower lobe  peribronchial thickening has increased. No new confluent pulmonary opacity.  No pericardial effusion. No mediastinal lymphadenopathy. Stable small right hilar nodes up to 8 mm short axis. Stable thoracic inlet. Major mediastinal vascular structures appear stable, with moderate to severe aortic and coronary artery soft and calcified atherosclerosis.  No axillary lymphadenopathy. Stable visualized liver, gallbladder, spleen, pancreas, adrenal glands, kidneys, and bowel in the upper abdomen.  Largely resolved vertebral body gas previously-seen at some levels. No new osseous abnormality identified. Thoracic epidural lipomatosis re- identified. Compression fractures Re identified. Underlying osteopenia.  IMPRESSION: 1. Regressed but not resolved medial right upper lobe cavitary lesion containing fluid compatible with pulmonary abscess. Recommend continued imaging follow up to resolution. 2. New left lower lobe peribronchial thickening may reflect exacerbation of infectious airway disease. 3. Otherwise stable chest.   Electronically Signed   By: Odessa Fleming M.D.   On: 06/07/2015 18:11   Ct Chest W Contrast  05/15/2015   CLINICAL DATA:  Abnormal chest radiograph, right upper lobe pneumonia  EXAM: CT CHEST WITH CONTRAST  TECHNIQUE: Multidetector CT imaging of the chest was performed during intravenous contrast administration.  CONTRAST:  75mL OMNIPAQUE IOHEXOL 300 MG/ML  SOLN  COMPARISON:  Chest radiograph dated 05/15/2015. CTA chest dated 03/20/2015.  FINDINGS: Mediastinum/Nodes: Heart is normal in size. No pericardial effusion.  Coronary atherosclerosis. Postsurgical changes related to prior CABG.  Small mediastinal lymph nodes, including a 9 mm short axis right paratracheal node (series 2/ image 26), likely reactive.  Visualized thyroid is unremarkable.  Lungs/Pleura: 6.7 x 8.5 x 7.7 cm mildly thick-walled cavitary lesion in the anterior/medial right upper lobe (series 3/image 23 with associated air-fluid level and  surrounding ground-glass opacity/inflammatory changes. Lesion was not present on prior CT. These findings are most suggestive of pulmonary abscess.  Underlying mild emphysematous changes.  No pleural effusion or pneumothorax.  Upper abdomen: Visualized upper abdomen is notable for vascular calcifications.  Musculoskeletal: Degenerative changes of the visualized thoracolumbar spine.  Multiple mild to moderate thoracic compression fracture deformities involving the mid to lower thoracic spine, chronic.  IMPRESSION: 8.5 cm cavitary lesion with air-fluid level and surrounding inflammatory changes in the medial right upper lobe, new from prior CT, compatible with pulmonary abscess.  9 mm short axis right  paratracheal node, likely reactive.  Consider follow-up CT chest in 4-6 weeks to document improvement/resolution.   Electronically Signed   By: Charline Bills M.D.   On: 05/15/2015 13:56   Dg Chest Portable 1 View  06/07/2015   CLINICAL DATA:  Shortness of breath.  EXAM: PORTABLE CHEST - 1 VIEW  COMPARISON:  June 05, 2015.  FINDINGS: The heart size and mediastinal contours are within normal limits. No pneumothorax or pleural effusion is noted. Left lung is clear. Continued presence of cavitary lesion is seen medially in right upper lobe. The visualized skeletal structures are unremarkable.  IMPRESSION: Continued presence of cavitary lesion seen medially in right upper lobe concerning for abscess. No significant change compared to prior exam.   Electronically Signed   By: Lupita Raider, M.D.   On: 06/07/2015 13:27   Dg Chest Port 1 View  05/27/2015   CLINICAL DATA:  Chronic respiratory failure with hypercapnia.  EXAM: PORTABLE CHEST - 1 VIEW  COMPARISON:  05/27/2015  FINDINGS: There is reduced opacity in the right upper lobe, with mild residual linear opacities in the area. No confluent airspace consolidation is evident. The pulmonary vasculature is normal. There is no large pleural effusion.  There is a  satisfactorily positioned left upper extremity PICC line with tip in the low SVC.  IMPRESSION: Reducing right upper lobe opacity. No acute cardiopulmonary findings.   Electronically Signed   By: Ellery Plunk M.D.   On: 05/27/2015 22:10   Dg Chest Port 1 View  05/27/2015   CLINICAL DATA:  Shortness of breath and respiratory distress  EXAM: PORTABLE CHEST - 1 VIEW  COMPARISON:  05/23/2015  FINDINGS: Cardiac shadow is stable. Cavitary lesion is again noted in the right upper lobe stable from the prior study. Previously seen atelectasis has resolved in the interval. No focal infiltrate is seen. No bony abnormality is noted.  IMPRESSION: Stable right upper lobe cavitary lesion.  No other focal abnormality is seen.   Electronically Signed   By: Alcide Clever M.D.   On: 05/27/2015 13:00   Dg Chest Port 1 View  05/23/2015   CLINICAL DATA:  Acute chest pain today with chest congestion, shortness of breath, and wheezing. Left arm numbness.  EXAM: PORTABLE CHEST - 1 VIEW  COMPARISON:  05/16/2015, 04/03/2015 and chest CT dated 05/15/2015  FINDINGS: PICC tip just above the cavoatrial junction, unchanged. There are new small Kerley B-lines at the left base and there is new slight atelectasis at the right base. Heart size and pulmonary vascularity are normal. Right lung abscess is smaller than on the prior study. No acute osseous abnormality.  IMPRESSION: 1. Decreased cavitary right upper lobe  lung abscess. 2. Bibasilar atelectasis with possible interstitial edema at the left lung base.   Electronically Signed   By: Francene Boyers M.D.   On: 05/23/2015 17:51   Dg Chest Port 1 View  05/16/2015   CLINICAL DATA:  Shortness of breath, respiratory failure  EXAM: PORTABLE CHEST - 1 VIEW  COMPARISON:  05/15/2015  FINDINGS: Medial right upper lobe large cavitary mass again evident consistent with a pulmonary abscess when compared to yesterday's chest CT. This measures approximate 8.8 x 8.3 cm by plain radiography. Left lung  remains clear. No developing effusion. Negative for pneumothorax. Trachea midline. Normal heart size and vascularity. Left PICC line tip mid SVC level.  IMPRESSION: Medial right upper lobe large cavitary mass compatible with a pulmonary abscess. No new finding by plain radiography.   Electronically  Signed   By: Judie Petit.  Shick M.D.   On: 05/16/2015 07:56    Microbiology: Recent Results (from the past 240 hour(s))  Blood culture (routine x 2)     Status: None (Preliminary result)   Collection Time: 06/07/15  1:05 PM  Result Value Ref Range Status   Specimen Description BLOOD LEFT ANTECUBITAL  Final   Special Requests BOTTLES DRAWN AEROBIC AND ANAEROBIC 5CC  Final   Culture NO GROWTH 4 DAYS  Final   Report Status PENDING  Incomplete  Blood culture (routine x 2)     Status: None (Preliminary result)   Collection Time: 06/07/15  1:10 PM  Result Value Ref Range Status   Specimen Description BLOOD RIGHT HAND  Final   Special Requests BOTTLES DRAWN AEROBIC AND ANAEROBIC 5CC  Final   Culture NO GROWTH 4 DAYS  Final   Report Status PENDING  Incomplete  MRSA PCR Screening     Status: None   Collection Time: 06/07/15  7:13 PM  Result Value Ref Range Status   MRSA by PCR NEGATIVE NEGATIVE Final    Comment:        The GeneXpert MRSA Assay (FDA approved for NASAL specimens only), is one component of a comprehensive MRSA colonization surveillance program. It is not intended to diagnose MRSA infection nor to guide or monitor treatment for MRSA infections.      Labs: Basic Metabolic Panel:  Recent Labs Lab 06/05/15 1628 06/07/15 1309 06/10/15 1620  NA 133* 135 137  K 4.1 3.5 3.8  CL 90* 87* 89*  CO2 32 35* 38*  GLUCOSE 218* 140* 220*  BUN 27* 17 13  CREATININE 0.95 1.00 0.58*  CALCIUM 8.6* 9.1 8.4*   Liver Function Tests:  Recent Labs Lab 06/07/15 1309  AST 26  ALT 34  ALKPHOS 104  BILITOT 0.7  PROT 6.6  ALBUMIN 3.3*   No results for input(s): LIPASE, AMYLASE in the last 168  hours. No results for input(s): AMMONIA in the last 168 hours. CBC:  Recent Labs Lab 06/05/15 1628 06/07/15 1309  WBC 13.6* 11.2*  NEUTROABS  --  10.1*  HGB 9.8* 11.1*  HCT 31.2* 34.4*  MCV 89.1 88.7  PLT 189 181   Cardiac Enzymes:  Recent Labs Lab 06/07/15 1309 06/07/15 1721 06/07/15 2230  TROPONINI 0.06* 0.07* 0.06*   BNP: BNP (last 3 results)  Recent Labs  05/04/15 2318 05/24/15 0415 06/07/15 1309  BNP 136.0* 350.6* 102.5*    ProBNP (last 3 results)  Recent Labs  10/27/14 2311  PROBNP 89.0    CBG:  Recent Labs Lab 06/11/15 0743 06/11/15 1144 06/11/15 1642 06/11/15 2139 06/12/15 0743  GLUCAP 215* 250* 243* 311* 147*       Signed:  Scorpio Fortin L  [facility] 06/12/2015, 9:49 AM

## 2015-06-12 NOTE — Telephone Encounter (Signed)
Called spoke with Randall from hospice. Pt is being released today from hospital and they received a hospice referral. They are wanting to know if RB will be attending and if he wants symptom management from hospice doctors? Please advise thanks

## 2015-06-12 NOTE — Progress Notes (Signed)
[name] to be D/C'd Home per MD order.  Discussed prescriptions and follow up appointments with the patient. Prescriptions given to patient, medication list explained in detail. Pt verbalized understanding.    Medication List    STOP taking these medications        HYDROcodone-acetaminophen 5-325 MG per tablet  Commonly known as:  NORCO     metFORMIN 500 MG tablet  Commonly known as:  GLUCOPHAGE     pravastatin 20 MG tablet  Commonly known as:  PRAVACHOL     traMADol 50 MG tablet  Commonly known as:  ULTRAM      TAKE these medications        acetaminophen 325 MG tablet  Commonly known as:  TYLENOL  Take 2 tablets (650 mg total) by mouth every 6 (six) hours as needed for mild pain (or Fever >/= 101).     albuterol (2.5 MG/3ML) 0.083% nebulizer solution  Commonly known as:  PROVENTIL  Take 3 mLs (2.5 mg total) by nebulization every 6 (six) hours as needed for wheezing or shortness of breath. Dx 496     albuterol 108 (90 BASE) MCG/ACT inhaler  Commonly known as:  PROVENTIL HFA;VENTOLIN HFA  Inhale 2 puffs into the lungs every 4 (four) hours as needed for wheezing or shortness of breath.     ALPRAZolam 0.25 MG tablet  Commonly known as:  XANAX  Take 1 tablet (0.25 mg total) by mouth 2 (two) times daily as needed for anxiety.     aspirin 325 MG tablet  Take 325 mg by mouth daily.     bisoprolol 5 MG tablet  Commonly known as:  ZEBETA  Take 1 tablet (5 mg total) by mouth 2 (two) times daily.     clopidogrel 75 MG tablet  Commonly known as:  PLAVIX  Take 1 tablet (75 mg total) by mouth daily.     fluticasone 50 MCG/ACT nasal spray  Commonly known as:  FLONASE  Place 2 sprays into both nostrils 2 (two) times daily.     furosemide 20 MG tablet  Commonly known as:  LASIX  Take 3 tablets (60 mg total) by mouth 2 (two) times daily.     insulin glargine 100 unit/mL Sopn  Commonly known as:  LANTUS  Inject 6 Units into the skin daily.     ipratropium 0.02 %  nebulizer solution  Commonly known as:  ATROVENT  Take 2.5 mLs (0.5 mg total) by nebulization every 4 (four) hours as needed for wheezing or shortness of breath.     mometasone-formoterol 100-5 MCG/ACT Aero  Commonly known as:  DULERA  Inhale 2 puffs into the lungs 2 (two) times daily.     morphine 20 MG/ML concentrated solution  Commonly known as:  ROXANOL  Take 0.5 mLs (10 mg total) by mouth every 3 (three) hours as needed for moderate pain, anxiety or shortness of breath.     pantoprazole 40 MG tablet  Commonly known as:  PROTONIX  Take 1 tablet (40 mg total) by mouth 2 (two) times daily.     potassium chloride SA 20 MEQ tablet  Commonly known as:  K-DUR,KLOR-CON  Take 1 tablet (20 mEq total) by mouth daily.     predniSONE 10 MG tablet  Commonly known as:  DELTASONE  Take 5 tablets daily for 3 days then 3 tablet daily     sulfamethoxazole-trimethoprim 800-160 MG per tablet  Commonly known as:  BACTRIM DS,SEPTRA DS  Take 1 tablet  by mouth every 12 (twelve) hours.        Filed Vitals:   06/12/15 0957  BP: 125/78  Pulse: 80  Temp: 97.9 F (36.6 C)  Resp: 15    Skin clean, dry and intact without evidence of skin break down. IV catheter discontinued intact. Site without signs and symptoms of complications. Dressing and pressure applied. Pt denies pain at this time. No complaints noted.  An After Visit Summary was printed and given to the patient. Patient escorted via WC, and D/C home via private auto.  Modena Nunnery C 06/12/2015 4:54 PM

## 2015-06-12 NOTE — Progress Notes (Signed)
Patient sitting up in the bed this morning talking on the phone. Pt. Reports he is feeling better. Pt. To go home today by PTAR. Patient advised [facility] will come out today after he gets home to do his admission visit.  Please call with any questions.  Sharen Heck RN [facility] Liaison [phone]

## 2015-06-12 NOTE — Telephone Encounter (Signed)
Called and gave VO to yvette. Nothing further needed

## 2015-06-16 ENCOUNTER — Telehealth: Payer: Self-pay | Admitting: Emergency Medicine

## 2015-06-16 NOTE — Telephone Encounter (Signed)
Thank you for letting me know

## 2015-06-16 NOTE — Telephone Encounter (Signed)
Will forward to RB as an FYI 

## 2015-06-18 ENCOUNTER — Telehealth: Payer: Self-pay | Admitting: Emergency Medicine

## 2015-06-20 NOTE — Telephone Encounter (Signed)
Will route message to RB to make him aware. 

## 2015-06-20 DEATH — deceased

## 2015-07-15 NOTE — Progress Notes (Signed)
Cardiology Office Note   Date:  07/16/2015   ID:  [name], DOB [birth year], MRN [id number]  PCP:  Jeanann Lewandowsky, MD  Cardiologist:  Dr. Rollene Rotunda     Chief Complaint  Patient presents with  . Coronary Artery Disease     History of Present Illness: [name] is a 64 y.o. male with a hx of CAD status post stenting of the RCA in 2005, non-STEMI in 2/08 with heart cath demonstrating mid circumflex occlusion treated with overlapping DES. Patient had residual disease in the ostial RI at 70%, ostial LCx 30-40% and mild to moderate nonobstructive disease in the RCA system. Previous RCA stent was patent. 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   Admitted to [facility] 01/2015 and underwent LHC with PCI with DES to the RCA and LCx. EF 40-45% at that time. There was also an apparent history of DVT and GI bleed in the setting of aspirin, Plavix, Eliquis and Lovenox. Lovenox and Eliquis were stopped. EGD was apparently stable. 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  Admitted 5/16 for acute on chronic hypoxic respiratory failure in the setting of HCAP and MRSA RUL lung abscess complicated by acute renal failure. He was not felt to be a surgical candidate given his severe COPD. Echo at that time demonstrated EF 60-65% and no wall motion abnormalities. There was a small free-flowing pericardial effusion without evidence of hemodynamic compromise.  Admitted 6/3-6/14 with chest pain and dyspnea. Discomfort was similar to previous angina. Troponins were minimally elevated and he had new anterolateral T-wave inversions. This was in the setting of profound anemia with a hemoglobin of 6.9. He was transfused with PRBCs. He was seen by cardiology.  Echo demonstrated reduced LV function with an EF of 45-50%, inferior HK. Cardiac catheterization was arranged. This  demonstrated nonobstructive CAD with a patent stent in the OM1 and patent stents in the RCA. Medical therapy was recommended.  Records indicate he had a NSTEMI but with only minimally elevated Troponins suspect this was demand ischemia in setting of severe anemia.  He was also followed by pulmonology. Pulmonary regimen was adjusted. It was felt that percutaneous drainage of his abscess was high risk due to risk of fistula and empyema.  Readmitted 6/18-6/23 with worsening shortness of breath, productive cough, fevers and chills. Patient was seen by palliative care and transitioned to comfort care. He was discharged with home hospice. Cardiac enzymes are minimally elevated. Medical therapy was continued given recent stable anatomy and cardiac catheterization.  He returns for follow-up.   Studies/Reports Reviewed Today:  LHC 05/26/15 LAD:  Mid 30% LCx:  OM1 stent ok (20% ISR), then 30% RCA:  prox stent ok EF normal Nonobstructive evidence for coronary calcification and 30% narrowing proximal LAD septal perforating artery before the diagonal vessel; widely patent tandem stents in the circumflex marginal vessel with 30% narrowing beyond the stented segment prior to the OM bifurcation; and widely patent stent in the proximal dominant RCA.  Echo 05/24/15 EF 45-50%, inf HK, Gr 1 DD Mild AI, mean 8 mmHg  Myoview 01/2010 IMPRESSION: Normal wall motion and no ischemia. The fixed inferior and mid anterior defects are in the setting of normal wall motion and may be artifactual. SDS 0.   Past Medical History  Diagnosis Date  . COPD (chronic obstructive pulmonary disease)   . CAD (coronary artery disease)   .  Hypertension   . Hyperlipidemia   . Myocardial infarction     "I've had a couple since 2006" (12/26/2013)  . Asthma   . GERD (gastroesophageal reflux disease)   . Stroke     "they say I've had a couple" denies residual on 12/26/2013  . Osteomyelitis hip "when I was young"    "right"  . CHF  (congestive heart failure)   . Pneumonia     "have had it several times"  . OSA on CPAP     "wear mask q now and then" (05/24/2015)  . Type II diabetes mellitus   . History of blood transfusion "couple times"    "related to blood thinners"  . Chronic back pain   . Anxiety     Past Surgical History  Procedure Laterality Date  . Appendectomy  1963  . Cardiac catheterization  ? 2009; ?2012  . Coronary angioplasty with stent placement  2006; ?2008; ?2010; 01/2015    "2 + 2 + 1 + 1"  . Cardiac catheterization N/A 05/26/2015    Procedure: Left Heart Cath and Coronary Angiography;  Surgeon: Lennette Bihari, MD;  Location: [facility] INVASIVE CV LAB;  Service: Cardiovascular;  Laterality: N/A;     Current Outpatient Prescriptions  Medication Sig Dispense Refill  . acetaminophen (TYLENOL) 325 MG tablet Take 2 tablets (650 mg total) by mouth every 6 (six) hours as needed for mild pain (or Fever >/= 101). 30 tablet 0  . albuterol (PROVENTIL HFA;VENTOLIN HFA) 108 (90 BASE) MCG/ACT inhaler Inhale 2 puffs into the lungs every 4 (four) hours as needed for wheezing or shortness of breath. 1 Inhaler 11  . albuterol (PROVENTIL) (2.5 MG/3ML) 0.083% nebulizer solution Take 3 mLs (2.5 mg total) by nebulization every 6 (six) hours as needed for wheezing or shortness of breath. Dx 496 360 mL 11  . ALPRAZolam (XANAX) 0.25 MG tablet Take 1 tablet (0.25 mg total) by mouth 2 (two) times daily as needed for anxiety. 30 tablet 0  . aspirin 325 MG tablet Take 325 mg by mouth daily.    . bisoprolol (ZEBETA) 5 MG tablet Take 1 tablet (5 mg total) by mouth 2 (two) times daily. 60 tablet 1  . clopidogrel (PLAVIX) 75 MG tablet Take 1 tablet (75 mg total) by mouth daily. 31 tablet 3  . fluticasone (FLONASE) 50 MCG/ACT nasal spray Place 2 sprays into both nostrils 2 (two) times daily. 16 g 2  . furosemide (LASIX) 20 MG tablet Take 3 tablets (60 mg total) by mouth 2 (two) times daily. (Patient taking differently: Take 20 mg by mouth 3  (three) times daily. ) 120 tablet 1  . insulin glargine (LANTUS) 100 unit/mL SOPN Inject 6 Units into the skin daily.     Marland Kitchen ipratropium (ATROVENT) 0.02 % nebulizer solution Take 2.5 mLs (0.5 mg total) by nebulization every 4 (four) hours as needed for wheezing or shortness of breath. 300 mL 5  . mometasone-formoterol (DULERA) 100-5 MCG/ACT AERO Inhale 2 puffs into the lungs 2 (two) times daily. 3 Inhaler 3  . morphine (ROXANOL) 20 MG/ML concentrated solution Take 0.5 mLs (10 mg total) by mouth every 3 (three) hours as needed for moderate pain, anxiety or shortness of breath. 30 mL 0  . pantoprazole (PROTONIX) 40 MG tablet Take 1 tablet (40 mg total) by mouth 2 (two) times daily. 30 tablet 3  . potassium chloride SA (K-DUR,KLOR-CON) 20 MEQ tablet Take 1 tablet (20 mEq total) by mouth daily. 30 tablet 3  .  predniSONE (DELTASONE) 10 MG tablet Take 5 tablets daily for 3 days then 3 tablet daily (Patient taking differently: Take 20-40 mg by mouth daily at 12 noon. Tapered course: as of 06/07/15, take 4 tablets (40 mg) for one more day, then take 3 tablets (30 mg) for 3 days, then take 2 tablets (20 mg) for 2 days, then stop) 30 tablet 0  . sulfamethoxazole-trimethoprim (BACTRIM DS,SEPTRA DS) 800-160 MG per tablet Take 1 tablet by mouth every 12 (twelve) hours. (Patient taking differently: Take 1 tablet by mouth every 12 (twelve) hours. 10 day course started 06/06/15) 20 tablet 0   No current facility-administered medications for this visit.    Allergies:   Lipitor    Social History:  The patient  reports that he quit smoking about 9 months ago. His smoking use included Cigarettes. He has a 12.75 pack-year smoking history. He has never used smokeless tobacco. He reports that he does not drink alcohol or use illicit drugs.   Family History:  The patient's family history includes Coronary artery disease in an other family member; Hypertension in an other family member.    ROS:   Please see the history of  present illness.   ROS    PHYSICAL EXAM: VS:  There were no vitals taken for this visit.    Wt Readings from Last 3 Encounters:  06/11/15 180 lb (81.647 kg)  06/05/15 182 lb (82.555 kg)  06/02/15 182 lb 8 oz (82.781 kg)     GEN: Well nourished, well developed, in no acute distress HEENT: normal Neck: no JVD, no carotid bruits, no masses Cardiac:  Normal S1/S2, RRR; no murmur ,  no rubs or gallops, no edema  Respiratory:  clear to auscultation bilaterally, no wheezing, rhonchi or rales. GI: soft, nontender, nondistended, + BS MS: no deformity or atrophy Skin: warm and dry  Neuro:  CNs II-XII intact, Strength and sensation are intact Psych: Normal affect   EKG:  EKG is ordered today.  It demonstrates:      Recent Labs: 10/27/2014: Pro B Natriuretic peptide (BNP) 89.0 05/14/2015: Magnesium 2.2 06/07/2015: ALT 34; B Natriuretic Peptide 102.5*; Hemoglobin 11.1*; Platelets 181 06/10/2015: BUN 13; Creatinine, Ser 0.58*; Potassium 3.8; Sodium 137    Lipid Panel    Component Value Date/Time   CHOL 208* 01/10/2014 1215   TRIG 329* 01/10/2014 1215   HDL 92 01/10/2014 1215   CHOLHDL 2.3 01/10/2014 1215   VLDL 66* 01/10/2014 1215   LDLCALC 50 01/10/2014 1215      ASSESSMENT AND PLAN:  Atherosclerosis of native coronary artery of native heart without angina pectoris  Chronic diastolic CHF (congestive heart failure)  Other emphysema  Essential hypertension  MRSA pneumonia with RUL lung abscess  Anemia, unspecified anemia type  DNR (do not resuscitate)     Medication Changes: Current medicines are reviewed at length with the patient today.  Concerns regarding medicines are as outlined above.  The following changes have been made:   Discontinued Medications   No medications on file   Modified Medications   No medications on file   New Prescriptions   No medications on file     Labs/ tests ordered today include:   No orders of the defined types were placed in  this encounter.     Disposition:   FU with    Signed, Tereso Newcomer, PA-C, MHS 07/16/2015 10:10 AM    [facility] [address] [place], [place], [place]  [address] Phone: [phone])  [phone]; Fax: [phone]    This encounter was created in error - please disregard.

## 2015-07-16 ENCOUNTER — Encounter: Payer: Medicare Other | Admitting: Physician Assistant

## 2015-07-23 ENCOUNTER — Encounter: Payer: Self-pay | Admitting: Physician Assistant

## 2015-12-18 IMAGING — CR DG CHEST 2V
2 series · 2 of 2 positions shown · non-contrast
Comparison: 05/27/2015

CLINICAL DATA: Increasing shortness of Breath

EXAM:
CHEST - 2 VIEW

[w chest lat]
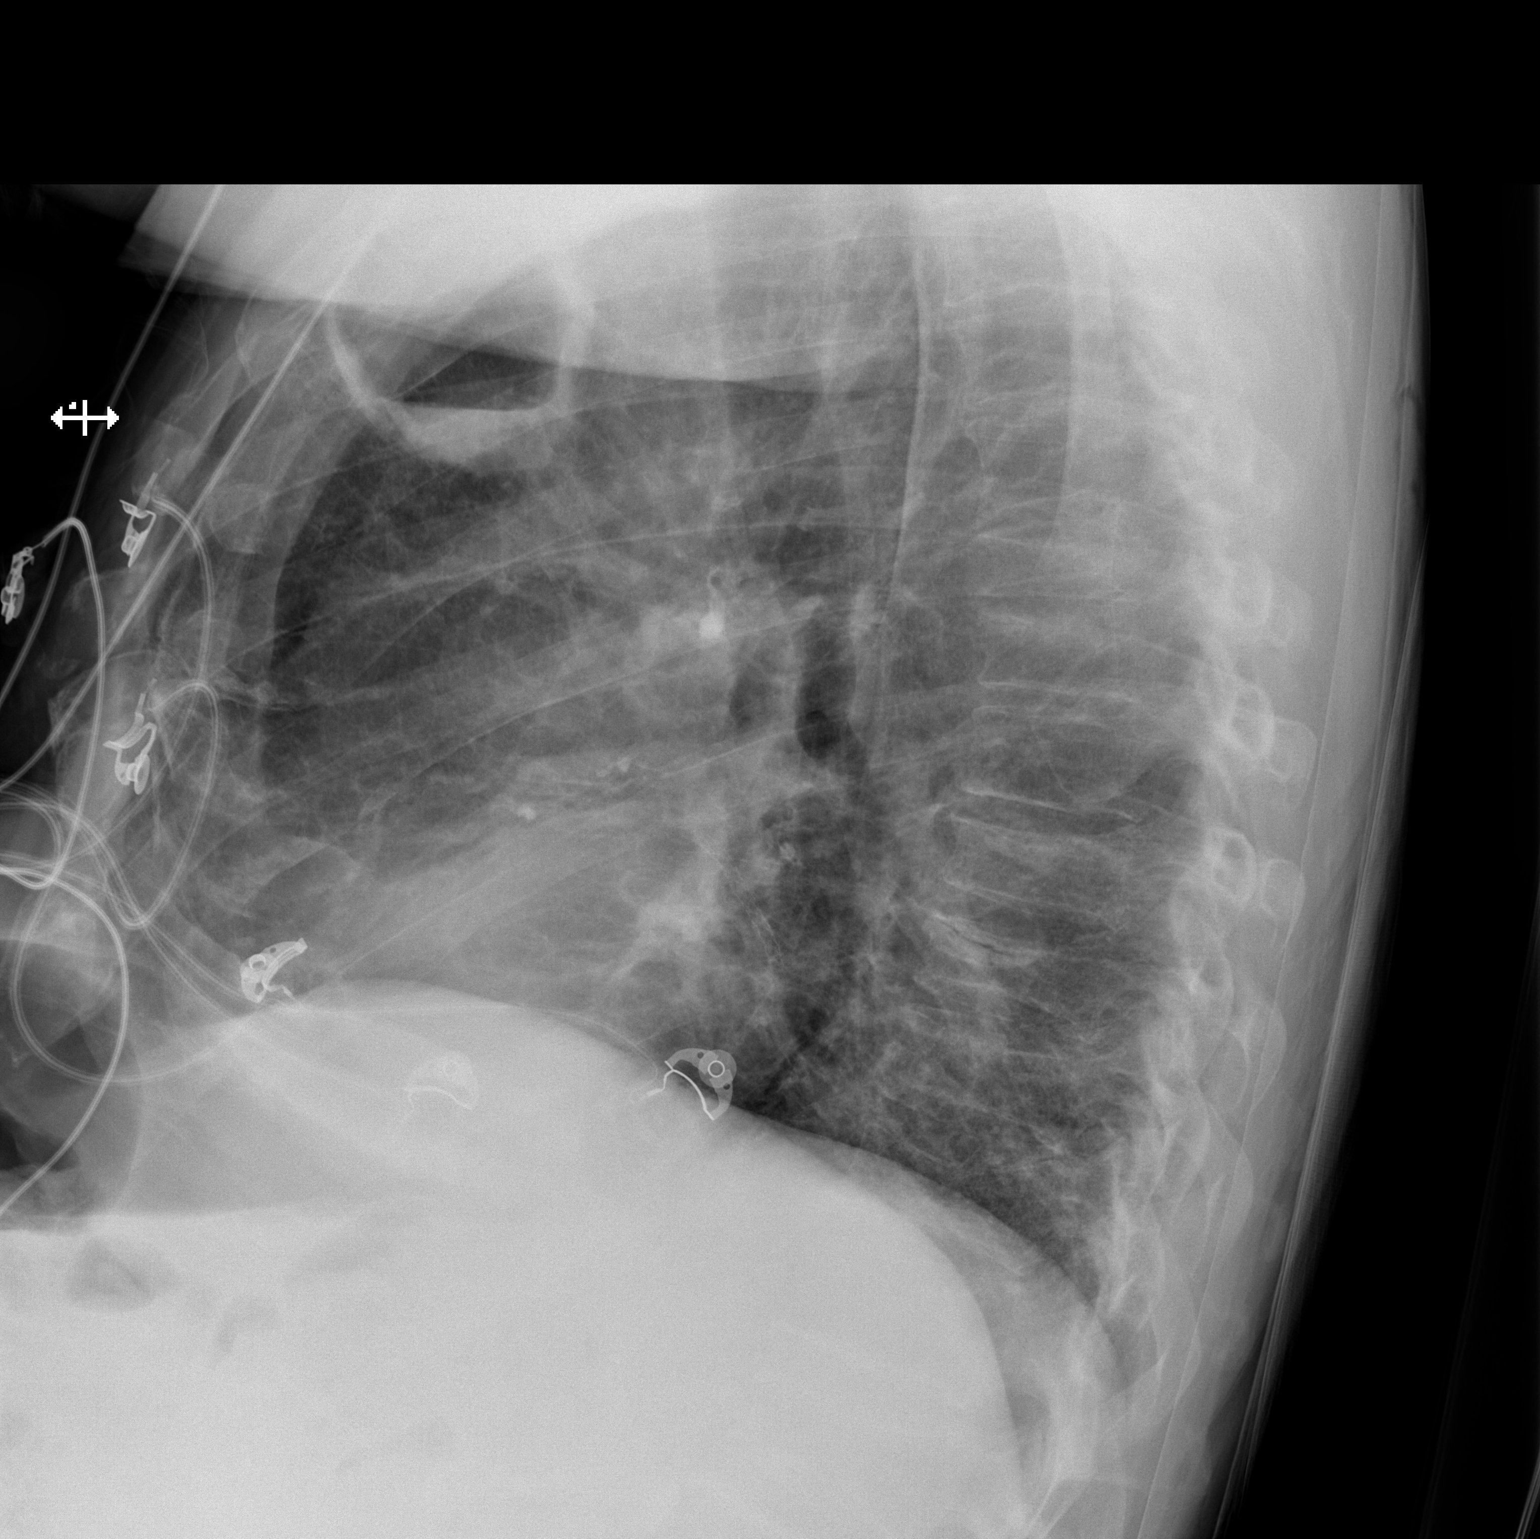

[x chest ap]
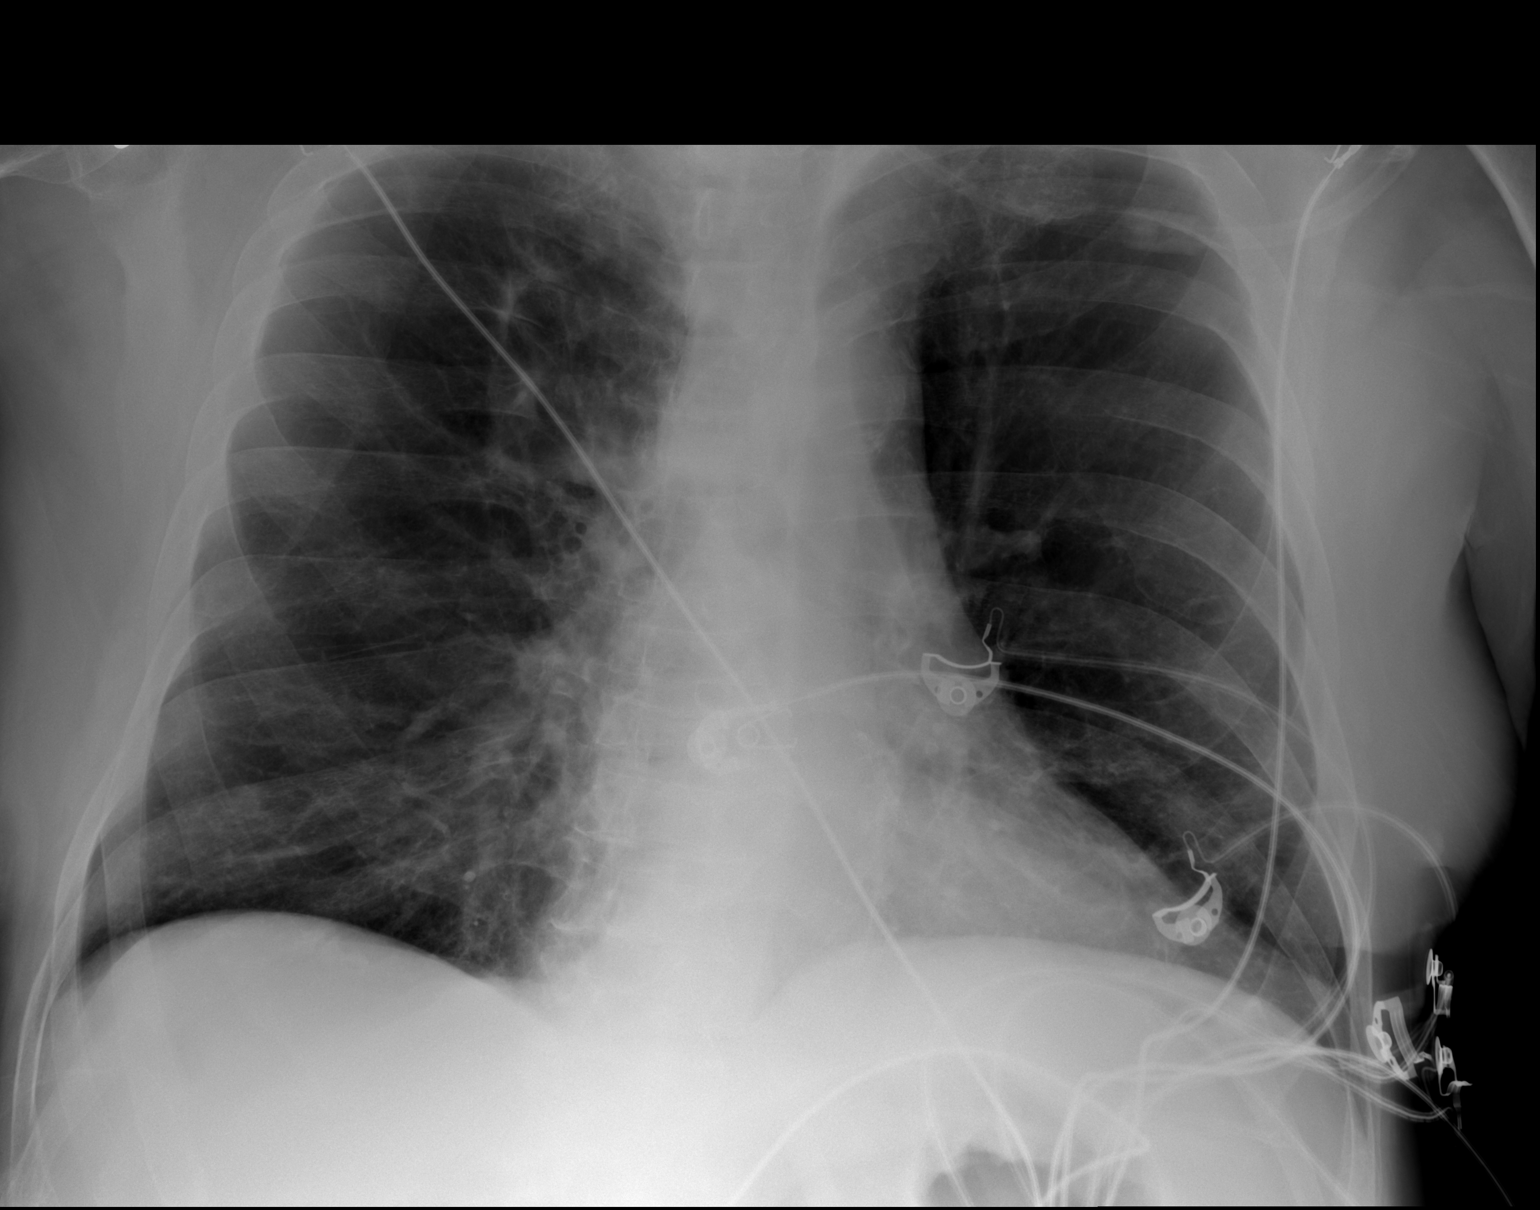

[2 of 2 positions shown; findings below may reference images not displayed]

FINDINGS: Cardiac shadow is within normal limits. The known cavitary lesion in
the right upper lobe is again identified and appears relatively
stable from previous CT examination. An air-fluid level is noted
within. No sizable infiltrate or effusion is noted. No bony
abnormality is seen.
IMPRESSION: Stable appearance of the right upper lobe cavitary lesion. No acute
abnormality is noted.

## 2015-12-18 IMAGING — CR DG SACRUM/COCCYX 2+V
3 series · 3 of 3 positions shown · non-contrast
Comparison: 04/22/2015.  PICC screw

CLINICAL DATA: 63-year-old male with history of weakness. Fall onto
the buttock region today complaining of pain in the tailbone region.

EXAM:
SACRUM AND COCCYX - 2+ VIEW

[t sacrum ap]
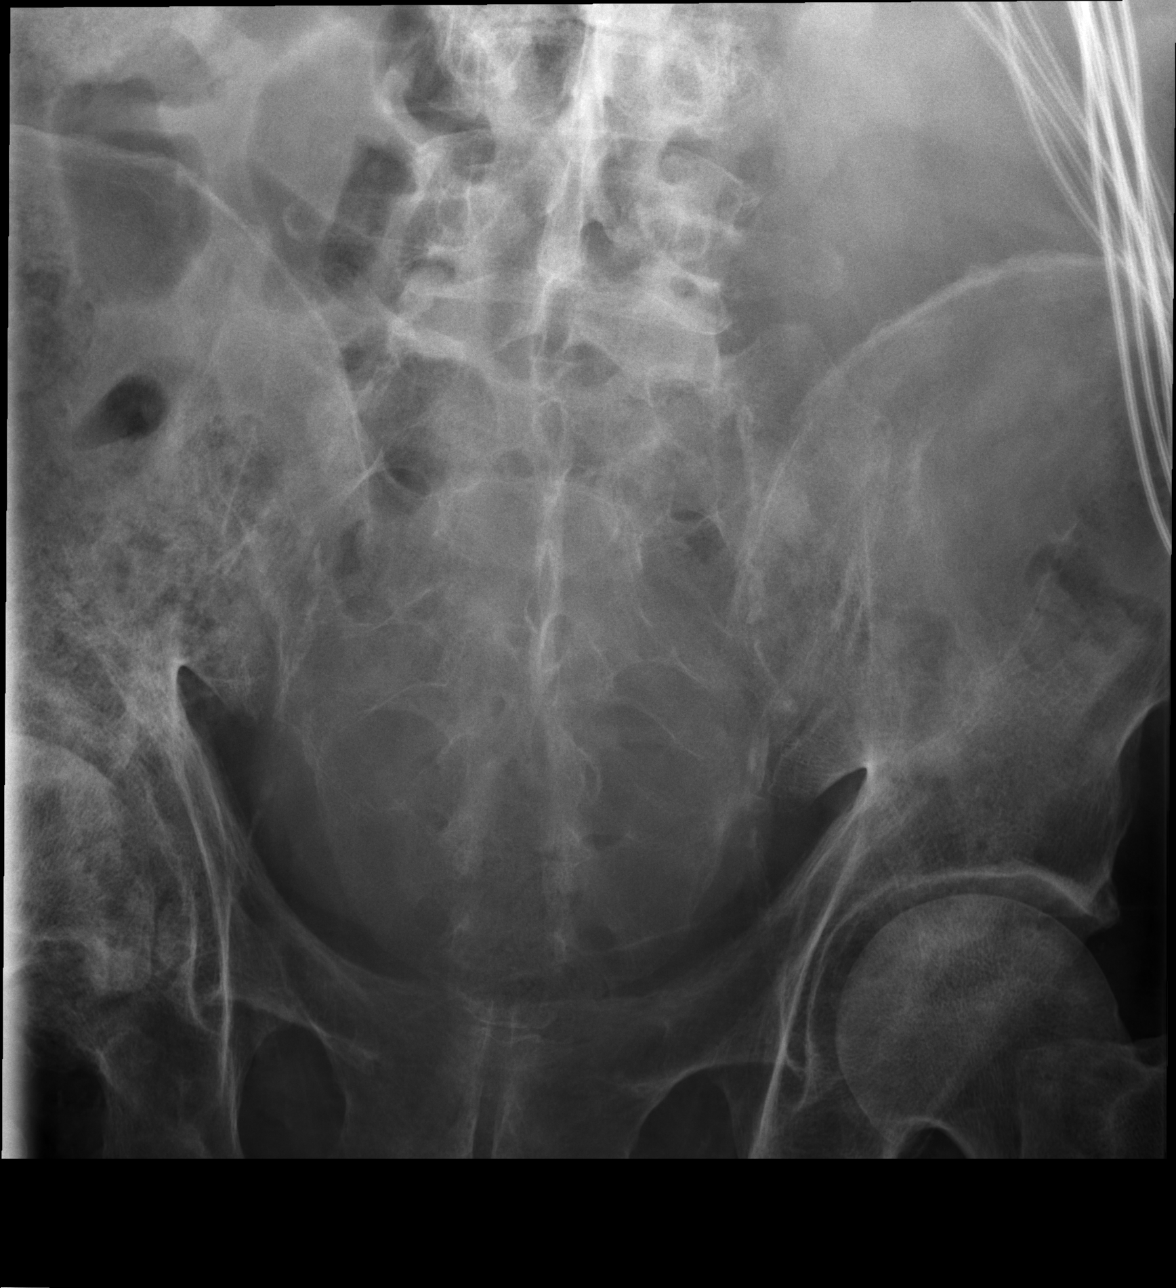

[t coccyx ap]
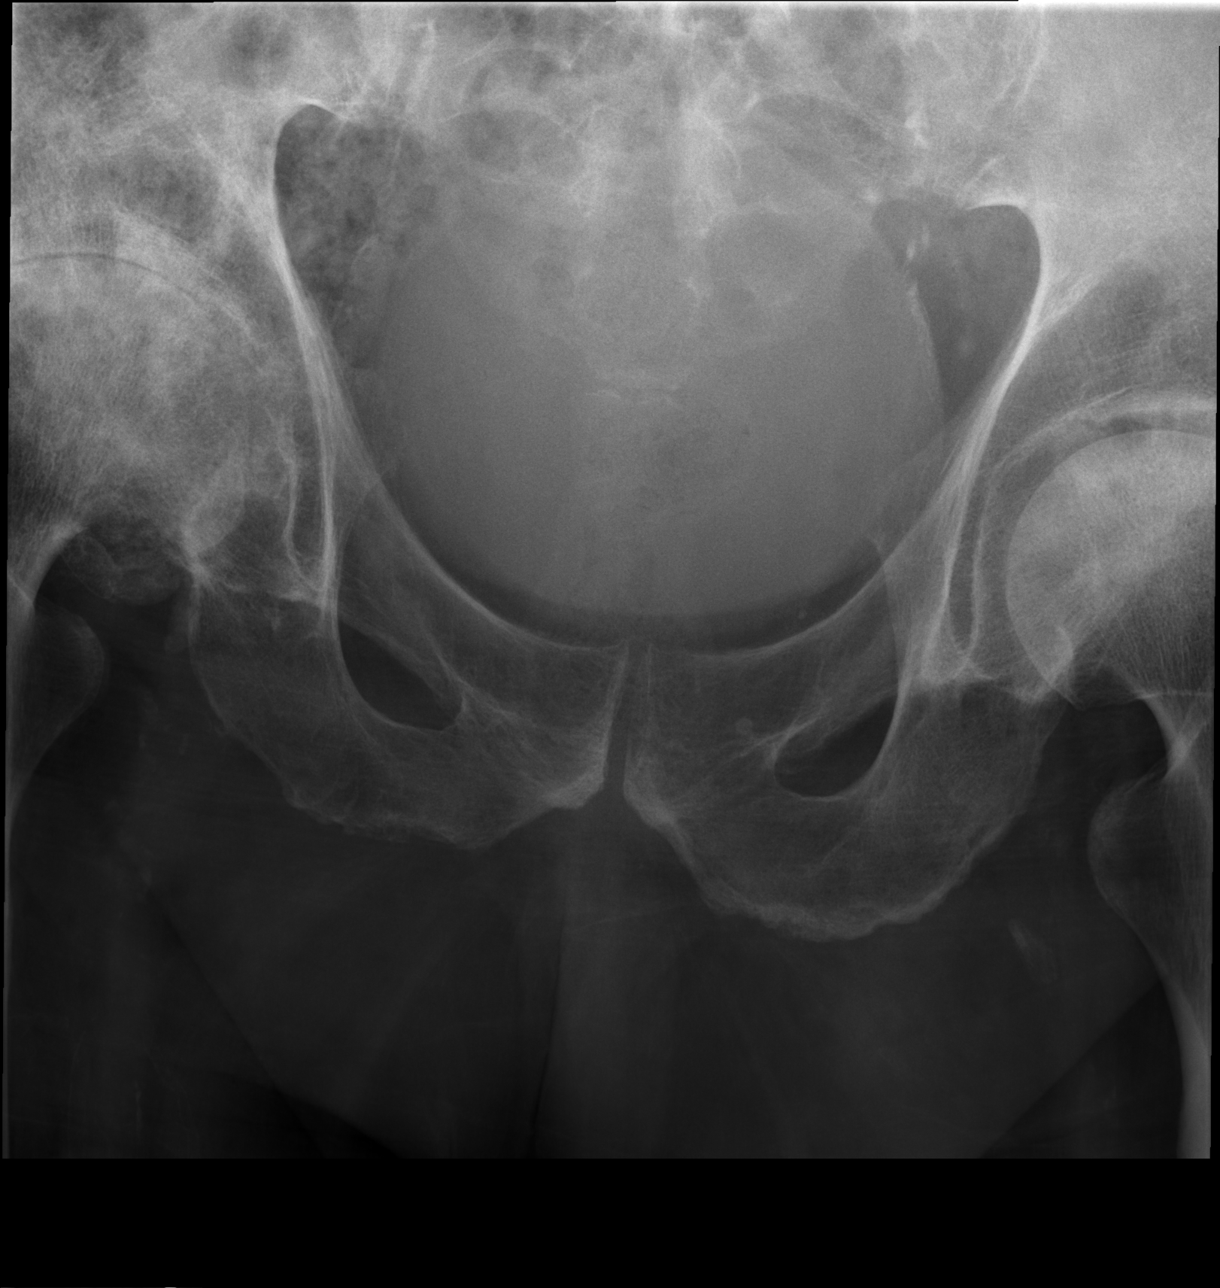

[t sacrum coccyx lat]
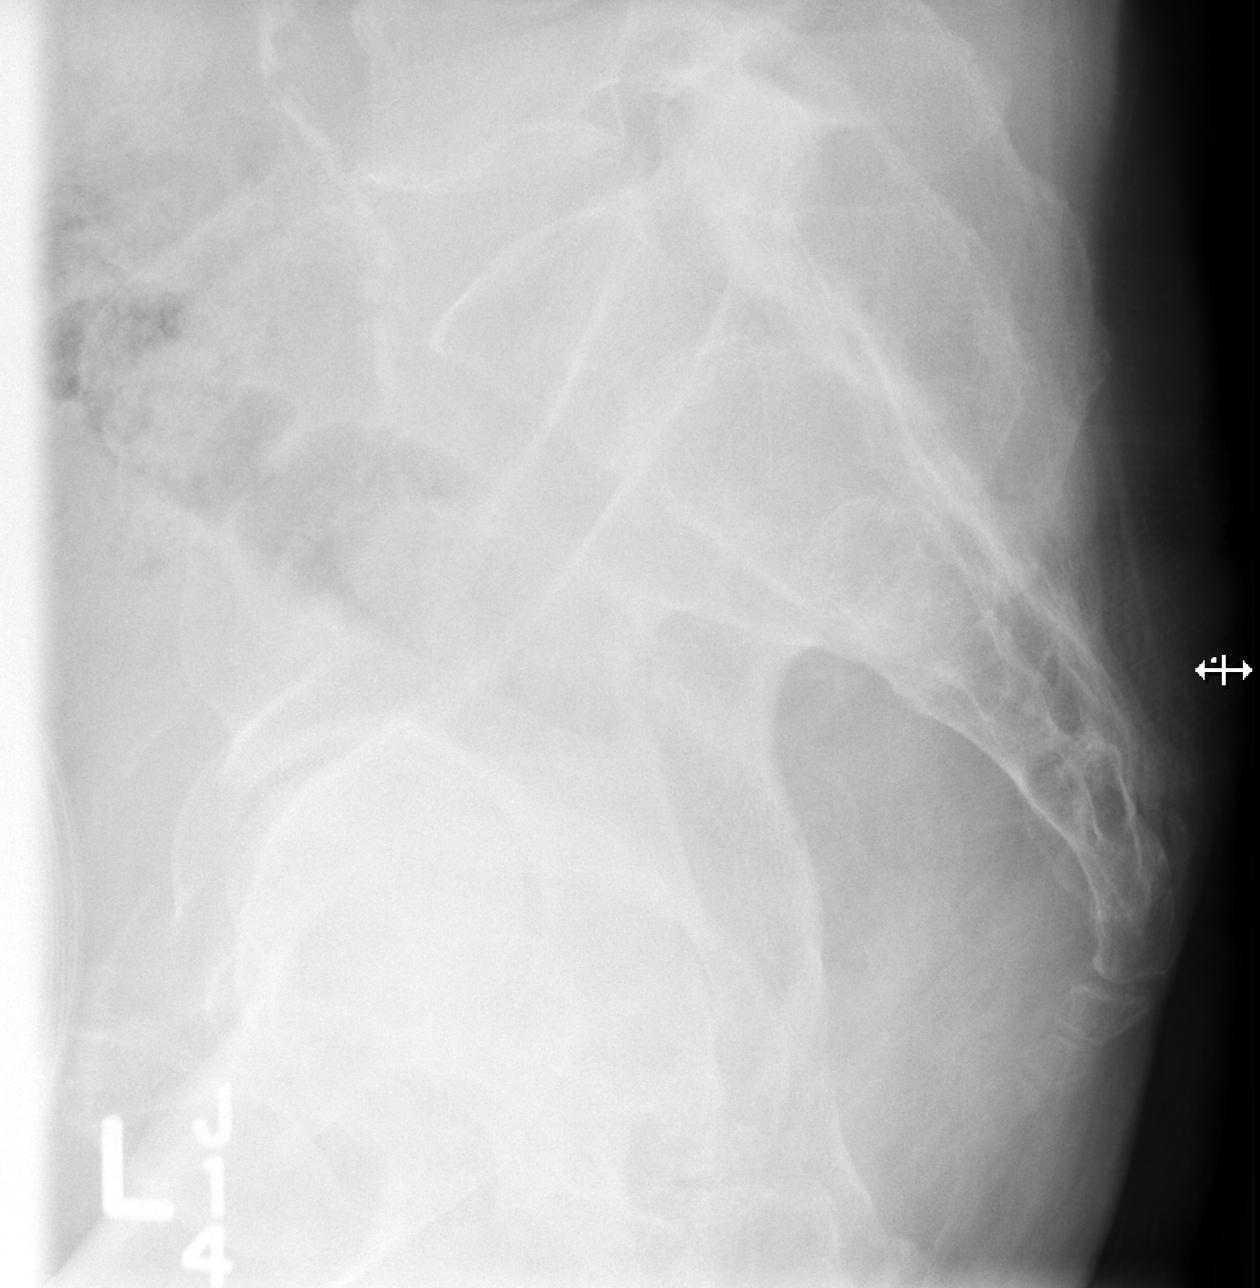

[3 of 3 positions shown; findings below may reference images not displayed]

FINDINGS: There is no evidence of fracture or other focal bone lesions.
Advanced degenerative changes of osteoarthritis are noted in the hip
joints bilaterally (right much greater than left).
IMPRESSION: 1. No acute abnormality of the sacrum or coccyx.
